# Patient Record
Sex: Female | Born: 1940 | Race: White | Hispanic: No | Marital: Married | Smoking: Former smoker
Health system: Southern US, Community
[De-identification: ages and names within clinical notes are randomized; demographics above are authoritative.]

## PROBLEM LIST (undated history)

## (undated) DIAGNOSIS — I219 Acute myocardial infarction, unspecified: Secondary | ICD-10-CM

## (undated) DIAGNOSIS — R519 Headache, unspecified: Secondary | ICD-10-CM

## (undated) DIAGNOSIS — F419 Anxiety disorder, unspecified: Secondary | ICD-10-CM

## (undated) DIAGNOSIS — T7840XA Allergy, unspecified, initial encounter: Secondary | ICD-10-CM

## (undated) DIAGNOSIS — K219 Gastro-esophageal reflux disease without esophagitis: Secondary | ICD-10-CM

## (undated) DIAGNOSIS — Z87442 Personal history of urinary calculi: Secondary | ICD-10-CM

## (undated) DIAGNOSIS — R51 Headache: Secondary | ICD-10-CM

## (undated) DIAGNOSIS — M810 Age-related osteoporosis without current pathological fracture: Secondary | ICD-10-CM

## (undated) DIAGNOSIS — I10 Essential (primary) hypertension: Secondary | ICD-10-CM

## (undated) DIAGNOSIS — J189 Pneumonia, unspecified organism: Secondary | ICD-10-CM

## (undated) HISTORY — DX: Age-related osteoporosis without current pathological fracture: M81.0

## (undated) HISTORY — PX: TONSILLECTOMY: SUR1361

## (undated) HISTORY — PX: ABDOMINAL HYSTERECTOMY: SHX81

## (undated) HISTORY — PX: APPENDECTOMY: SHX54

## (undated) HISTORY — DX: Allergy, unspecified, initial encounter: T78.40XA

## (undated) HISTORY — PX: BACK SURGERY: SHX140

## (undated) HISTORY — PX: CARDIAC CATHETERIZATION: SHX172

## (undated) HISTORY — PX: SPINE SURGERY: SHX786

---

## 2008-06-09 HISTORY — PX: CATARACT EXTRACTION W/ INTRAOCULAR LENS  IMPLANT, BILATERAL: SHX1307

## 2008-08-08 ENCOUNTER — Ambulatory Visit: Payer: Self-pay | Admitting: Ophthalmology

## 2008-08-21 ENCOUNTER — Ambulatory Visit: Payer: Self-pay | Admitting: Ophthalmology

## 2008-12-06 ENCOUNTER — Ambulatory Visit: Payer: Self-pay | Admitting: Ophthalmology

## 2008-12-18 ENCOUNTER — Ambulatory Visit: Payer: Self-pay | Admitting: Ophthalmology

## 2010-04-23 DIAGNOSIS — F32A Depression, unspecified: Secondary | ICD-10-CM | POA: Insufficient documentation

## 2010-04-23 DIAGNOSIS — E7800 Pure hypercholesterolemia, unspecified: Secondary | ICD-10-CM | POA: Insufficient documentation

## 2010-04-23 DIAGNOSIS — M1990 Unspecified osteoarthritis, unspecified site: Secondary | ICD-10-CM | POA: Insufficient documentation

## 2010-04-23 DIAGNOSIS — E039 Hypothyroidism, unspecified: Secondary | ICD-10-CM | POA: Insufficient documentation

## 2010-04-23 DIAGNOSIS — F329 Major depressive disorder, single episode, unspecified: Secondary | ICD-10-CM

## 2010-04-23 DIAGNOSIS — M797 Fibromyalgia: Secondary | ICD-10-CM | POA: Insufficient documentation

## 2010-04-23 DIAGNOSIS — I10 Essential (primary) hypertension: Secondary | ICD-10-CM | POA: Insufficient documentation

## 2010-04-23 DIAGNOSIS — K589 Irritable bowel syndrome without diarrhea: Secondary | ICD-10-CM | POA: Insufficient documentation

## 2010-07-31 ENCOUNTER — Ambulatory Visit: Payer: Self-pay | Admitting: Unknown Physician Specialty

## 2010-08-02 ENCOUNTER — Ambulatory Visit: Payer: Self-pay | Admitting: Unknown Physician Specialty

## 2011-08-06 ENCOUNTER — Ambulatory Visit: Payer: Self-pay | Admitting: Pain Medicine

## 2011-08-12 ENCOUNTER — Other Ambulatory Visit: Payer: Self-pay | Admitting: Pain Medicine

## 2011-08-12 LAB — SEDIMENTATION RATE: Erythrocyte Sed Rate: 8 mm/hr (ref 0–30)

## 2011-08-19 ENCOUNTER — Ambulatory Visit: Payer: Self-pay | Admitting: Pain Medicine

## 2011-08-25 ENCOUNTER — Ambulatory Visit: Payer: Self-pay | Admitting: Pain Medicine

## 2011-09-04 ENCOUNTER — Ambulatory Visit: Payer: Self-pay | Admitting: Pain Medicine

## 2011-09-18 ENCOUNTER — Ambulatory Visit: Payer: Self-pay | Admitting: Pain Medicine

## 2011-09-29 ENCOUNTER — Ambulatory Visit: Payer: Self-pay | Admitting: Pain Medicine

## 2011-10-02 ENCOUNTER — Ambulatory Visit: Payer: Self-pay | Admitting: Pain Medicine

## 2011-10-20 ENCOUNTER — Ambulatory Visit: Payer: Self-pay | Admitting: Pain Medicine

## 2011-12-10 ENCOUNTER — Ambulatory Visit: Payer: Self-pay | Admitting: Unknown Physician Specialty

## 2011-12-18 ENCOUNTER — Ambulatory Visit: Payer: Self-pay | Admitting: Pain Medicine

## 2012-01-12 ENCOUNTER — Ambulatory Visit: Payer: Self-pay | Admitting: Pain Medicine

## 2012-02-26 ENCOUNTER — Ambulatory Visit: Payer: Self-pay | Admitting: Pain Medicine

## 2012-03-22 ENCOUNTER — Ambulatory Visit: Payer: Self-pay | Admitting: Pain Medicine

## 2012-03-23 ENCOUNTER — Ambulatory Visit: Payer: Self-pay | Admitting: Pain Medicine

## 2012-04-07 ENCOUNTER — Ambulatory Visit: Payer: Self-pay | Admitting: Pain Medicine

## 2012-04-21 ENCOUNTER — Ambulatory Visit: Payer: Self-pay | Admitting: Pain Medicine

## 2012-05-20 ENCOUNTER — Ambulatory Visit: Payer: Self-pay | Admitting: Pain Medicine

## 2012-06-23 ENCOUNTER — Ambulatory Visit: Payer: Self-pay | Admitting: Pain Medicine

## 2012-06-23 ENCOUNTER — Other Ambulatory Visit: Payer: Self-pay | Admitting: Pain Medicine

## 2012-06-23 LAB — BASIC METABOLIC PANEL
Chloride: 93 mmol/L — ABNORMAL LOW (ref 98–107)
Co2: 31 mmol/L (ref 21–32)
Creatinine: 0.68 mg/dL (ref 0.60–1.30)
EGFR (African American): >60
EGFR (Non-African Amer.): >60
Glucose: 103 mg/dL — ABNORMAL HIGH (ref 65–99)
Potassium: 3.7 mmol/L (ref 3.5–5.1)
Sodium: 130 mmol/L — ABNORMAL LOW (ref 136–145)

## 2012-06-29 ENCOUNTER — Ambulatory Visit: Payer: Self-pay | Admitting: Pain Medicine

## 2012-07-19 ENCOUNTER — Ambulatory Visit: Payer: Self-pay | Admitting: Pain Medicine

## 2012-07-27 ENCOUNTER — Ambulatory Visit: Payer: Self-pay | Admitting: Pain Medicine

## 2012-08-11 ENCOUNTER — Ambulatory Visit: Payer: Self-pay | Admitting: Pain Medicine

## 2012-09-07 ENCOUNTER — Ambulatory Visit: Payer: Self-pay | Admitting: Pain Medicine

## 2012-09-27 ENCOUNTER — Ambulatory Visit: Payer: Self-pay | Admitting: Pain Medicine

## 2012-09-28 ENCOUNTER — Ambulatory Visit: Payer: Self-pay | Admitting: Pain Medicine

## 2012-10-20 ENCOUNTER — Ambulatory Visit: Payer: Self-pay | Admitting: Pain Medicine

## 2012-11-02 ENCOUNTER — Ambulatory Visit: Payer: Self-pay | Admitting: Pain Medicine

## 2012-12-03 ENCOUNTER — Ambulatory Visit: Payer: Self-pay | Admitting: Pain Medicine

## 2012-12-27 ENCOUNTER — Ambulatory Visit: Payer: Self-pay | Admitting: Unknown Physician Specialty

## 2012-12-27 LAB — CREATININE, SERUM
Creatinine: 0.71 mg/dL (ref 0.60–1.30)
EGFR (African American): >60

## 2013-04-18 DIAGNOSIS — N6459 Other signs and symptoms in breast: Secondary | ICD-10-CM | POA: Insufficient documentation

## 2013-06-27 ENCOUNTER — Ambulatory Visit: Payer: Self-pay | Admitting: Pain Medicine

## 2013-06-30 ENCOUNTER — Ambulatory Visit: Payer: Self-pay | Admitting: Pain Medicine

## 2013-06-30 ENCOUNTER — Other Ambulatory Visit: Payer: Self-pay | Admitting: Pain Medicine

## 2013-06-30 LAB — MAGNESIUM: Magnesium: 1.8 mg/dL

## 2013-07-13 ENCOUNTER — Ambulatory Visit: Payer: Self-pay | Admitting: Pain Medicine

## 2013-07-18 DIAGNOSIS — M720 Palmar fascial fibromatosis [Dupuytren]: Secondary | ICD-10-CM | POA: Insufficient documentation

## 2013-09-21 ENCOUNTER — Ambulatory Visit: Payer: Self-pay | Admitting: Pain Medicine

## 2013-11-08 ENCOUNTER — Ambulatory Visit: Payer: Self-pay | Admitting: Pain Medicine

## 2013-11-15 ENCOUNTER — Ambulatory Visit: Payer: Self-pay | Admitting: Pain Medicine

## 2013-12-12 ENCOUNTER — Ambulatory Visit: Payer: Self-pay | Admitting: Pain Medicine

## 2013-12-20 ENCOUNTER — Ambulatory Visit: Payer: Self-pay | Admitting: Pain Medicine

## 2013-12-29 ENCOUNTER — Ambulatory Visit: Payer: Self-pay | Admitting: Pain Medicine

## 2014-04-18 ENCOUNTER — Ambulatory Visit: Payer: Self-pay | Admitting: Unknown Physician Specialty

## 2014-05-01 DIAGNOSIS — Z87898 Personal history of other specified conditions: Secondary | ICD-10-CM | POA: Insufficient documentation

## 2014-05-08 ENCOUNTER — Institutional Professional Consult (permissible substitution): Payer: Self-pay | Admitting: Internal Medicine

## 2014-05-12 ENCOUNTER — Ambulatory Visit: Payer: Self-pay | Admitting: Pain Medicine

## 2014-05-19 ENCOUNTER — Ambulatory Visit: Payer: Self-pay | Admitting: Pain Medicine

## 2014-08-09 ENCOUNTER — Encounter: Admit: 2014-08-09 | Disposition: A | Discharge status: Home / Self Care | Payer: Self-pay

## 2014-09-08 ENCOUNTER — Encounter: Admit: 2014-09-08 | Disposition: A | Discharge status: Home / Self Care | Payer: Self-pay

## 2014-09-30 NOTE — Op Note (Signed)
PATIENT NAME:  Kendra Benton, Kendra Benton MR#:  [id number] DATE OF BIRTH:  1940-10-22  DATE OF PROCEDURE:  12/20/2013  Location: Operating Room Referring Physician: Micheal Likens, MD Procedure by: Kathlen Brunswick. Dossie Arbour, M.D.  Note: This is the case of a 74 year old white female patient with a history significant for failed back surgery syndrome and chronic bilateral lumbosacral radiculopathy/radiculitis who comes into the hospital today for permanent placement of a bilateral lumbar spinal cord stimulator under fluoroscopic guidance and IV sedation. On 11/08/2013, the patient had undergone a bilateral lumbar spinal cord stimulator trial with excellent results.   Procedure(s):  1. Permanent implantation of Lumbar Epidural, Double Percutaneous Neurostimulator Leads (16 electrode array). 2. Implantation of Epidural Neurostimulator Generator. 3. Fluoroscopic Needle Guidance 4. Intraoperative Analysis and Programming. 5. Postoperative Analysis and Programming. 6. Moderate Conscious Sedation by the [facility] Anesthesia Team.  Surgeon: Kathlen Brunswick. Dossie Arbour, M.D. Side of implant: Bilateral Top electrode tip level: Lower endplate of T7, bilaterally. Diagnostic Indications: Chronic Lumbosacral Radiculopathy/Radiculitis; failed back surgery syndrome; chronic pain syndrome; chronic postoperative pain; lumbago with sciatica, bilaterally; bilateral sciatica. Position: Prone.  Prepping solution: DuraPrep Area prepped: The thoracolumbar and sacral areas, were prepped with a broad-spectrum topical antiseptic microbicide. Target area: Lumbar Epidural Space, around the T8-9 vertebral body, for the electrode tip. Insertion site is the L2-3 intervertebral space. Level entered: L2-3. Number of attempts: One.  Infection Control: Standard Universal Precautions taken (Respiratory Hygiene/Cough Etiquette; Mouth, nose, eye protection; Hand Hygiene; Personal protective equipment (PPE); safe injection practices; and use of masks  and disposable sterile surgical gloves) as recommended by the Department of [place] for Disease Control and Prevention (CDC).  Safety Measures: Allergies were reviewed. Appropriate site, procedure, and patient were confirmed by following the Joint Commission's Universal Protocol (UP.01.01.01). The patient was asked to confirm marked site and procedure, before commencing. The patient was asked about blood thinners, or active infections, both of which were denied. No attempt was made at seeking any paresthesias. Aspiration looking for blood return was conducted prior to injecting. At no point did we inject any substances, as a needle was being advanced.  Pre-procedure Assessment:  A medical history and physical exam were obtained. Relevant documentation was reviewed and verified. Prior to the procedure, the patient was provided with an Audio CD, as well as written information on the procedure, including side-effects, and possible complications. Under the influence of no sedatives, a verbal, as well as a written informed consent were obtained, after having provided information on the risks and possible complications. To fulfill our ethical and legal obligations, as recommended by the American Medical Association's Code of Ethics, we have provided information to the patient about our clinical impression; the nature and purpose of an available treatment or procedure; the risks and benefits of an available treatment or procedure; alternatives; the risk and benefits of the alternative treatment or procedure; and the risks and benefits of not receiving or undergoing a treatment or procedure. The patient was provided information about the risks and possible complications associated with the procedure. These include, but not limited to, failure to achieve desired goals, infection, bleeding, organ or nerve damage, allergic reactions, paralysis, and death. In addition, the patient was informed that Medicine is  not an exact science; therefore, there is also the possibility of unforeseen risks and possible complications that may result in a catastrophic outcome. The patient indicated having understood very clearly.  We have given the patient no guarantees and we have made no promises. Ample time was given  to the patient to ask questions, all of which were answered, to the patient's satisfaction, before proceeding. The patient understands that by signing our informed consent form, they understand and accept the risks and the fact that it is impossible to predict all possible complications. Baseline vital signs were taken and the medical assessment was completed. Verification of the correct person, correct site (including marking of site), and correct procedure were performed and confirmed by the patient. Baseline vital signs were taken and the initial assessment was completed. Verification of the correct person, correct site (including marking of site), and correct procedure were performed and confirmed by the patient, in the form of a "Time Out".  Monitoring: The patient was monitored in the usual manner, using NIBPM, ECG, and pulse oximetry.  IV Access:  An IV access was obtained and secured.  Analgesia:  Moderate (Conscious) Intravenous sedation: Consent was obtained before administering any sedation. Availability of a responsible, adult driver, and NPO status confirmed. Meaningful verbal contact was maintained, with the patient at all times during the procedure. ASA Sedation Guidelines followed. For specifics on pharmacological type and quantity of sedation, please see nursing chart.  Prophylactic Antibiotics: Cefazolin (1st generation cephalosporin) 1 gm IVPB.  Local Anesthesia: Lidocaine 1%. The skin over the procedure site were infiltrated using a 3 ml Luer-Lok syringe with a 0.5 inch, 25-G needle. Deeper tissues were infiltrated using a 3.0 inch, 22-G spinal needle, under fluoroscopic  guidance.  Fluoroscopy: The patient was taken to the operative suite, where the patient was placed in position for the procedure, over the fluoroscopy compatible table. Fluoroscopy was manipulated, using "Tunnel Vision Technique", to obtain the best possible view of the target area, on the affected side. Parallax error was corrected before commencing the procedure. Gabor Racz's "Direction-Depth-Direction" technique was used to introduce the procedural needle under continuous pulsed fluoroscopic guidance. Once the target was reached, antero-posterior and lateral fluoroscopic views were taken to confirm needle placement in two planes. Fluoroscopy time: Please see the patient's chart for details.  Description of the procedure: The procedure site was prepped using a broad-spectrum topical antiseptic. The area was then draped in the usual and standard manner. "Time-out" was performed as per JC Universal Protocol (UP.01.01.01).   A midline incision was made over the spinous processes, and hemostasis attained. The procedural needle was then introduced through the incision using Gabor Racz's "Direction-Depth-Direction" technique, under pulsed fluoroscopic guidance. No attempt was made at seeking a paresthesia. The paramidline approach was used to enter the posterior lumbar epidural space at a 30 degree angle, using "Loss-of-resistance Technique" with 3 ml of PF-NaCl (0.9% NSS) + 0.5 ml of air, in a 5 ml glass syringe, using a "loss-of-bounce technique", at the desired level. Correct needle placement was confirmed in the  antero-posterior and lateral fluoroscopic views. The epidural lead was gently introduced under real-time fluoroscopy, constantly assessing for pain or paresthesias, until the tip was observed to be at the target level, on the side ipsilateral to the pain. The placement of the second lead was accomplished in a similar fashion. Once the target was thought to have been reached, antero-posterior and  lateral fluoroscopic views were taken to confirm electrode placement in two planes. Placement was tested until a comfortable stimulation pattern was observed over the usual painful area. Once the patient had assured [place] that the stimulation was in the correct pattern, and distribution, we proceeded to remove the 15-G "Tuohy" epidural needle(s). This was done while observing the electrode tip(s) under real-time fluoroscopy  to prevent movement. The patient was sedated again, and 1% lidocaine was infiltrated into the buttocks area, in order to create the generator pocket. The extension was tunneled and connected to the generator. An impedance check was conducted after connecting all sections of the system. Once hemostasis was confirmed, both wounds were closed with Vicryl 2-0 after cleaning them with a solution containing 50:50 hydrogen peroxide and Betadine. Surgical staples were used to close the skin. The wounds were covered with sterile transparent bio-occlusive dressings, to easily assess any evidence of infection in the future.  The patient tolerated the entire procedure well. A repeat set of vitals were taken after the procedure and the patient was kept under observation until discharge criteria was met. The patient was provided with discharge instructions, including a section on how to identify potential problems. Should any problems arise concerning this procedure, the patient was given instructions to immediately contact us, without hesitation. The neurostimulator representative and I, both provided the patient with our Business cards containing our contact telephone numbers, and instructed the patient to contact either one of [place], at any time, should there be any problems or questions. In any case, we plan to contact the patient by telephone for a follow-up status report regarding this interventional procedure.  EBL: 10 mL.  Complications: No heme; no paresthesias.  Disposition: Return to clinics in  10-11 days for removal of staples and postoperative evaluation.  Additional Comments/Plan: With the above-mentioned set up, the patient was able to obtain 100% coverage of her low back and bilateral lower extremity pain, especially on the left side that tends to be her worst.  Equipment used:   1.  Medtronic Sensor SureScan system model [id number], serial [id number]. 2.  Medtronic lead kit model C6295528, lot [id number]. 3.  Medtronic lead kit model [id number], lot [id number].  Disclaimer: Medicine is not an Chief Strategy Officer. The only guarantee in medicine is that nothing is guaranteed. It is important to note that the decision to proceed with this intervention was based on the information collected from the patient. The Data and conclusions were drawn from the patient's questionnaire, the interview, and the physical examination. Because the information was provided in large part by the patient, it cannot be guaranteed that it has not been purposely or unconsciously manipulated. Every effort has been made to obtain as much relevant data as possible for this evaluation. It is important to note that the conclusions that lead to this procedure are derived in large part from the available data. Always take into account that the treatment will also be dependent on availability of resources and existing treatment guidelines, considered by other Pain Management Practitioners as being common knowledge and practice, at this time. For Medico-Legal purposes, it is also important to point out that variations in procedural techniques and pharmacological choices are the acceptable norm. The indications, contraindications, technique, and results of the above procedure should only be interpreted and judged by a Board-Certified Interventional Pain Specialist with extensive familiarity and expertise in the same exact procedure and technique, doing otherwise would be inappropriate and  unethical.  ____________________________ Kathlen Brunswick. Dossie Arbour, MD fan:sb D: 12/20/2013 16:40:42 ET T: 12/20/2013 17:16:55 ET JOB#: [id number]  cc: Buelah Rennie A. Dossie Arbour, MD, <Dictator> Gaspar Cola MD ELECTRONICALLY SIGNED 12/22/2013 9:56

## 2014-10-03 ENCOUNTER — Other Ambulatory Visit: Payer: Self-pay | Admitting: Neurosurgery

## 2014-10-03 DIAGNOSIS — M810 Age-related osteoporosis without current pathological fracture: Secondary | ICD-10-CM

## 2014-10-09 ENCOUNTER — Ambulatory Visit
Admission: RE | Admit: 2014-10-09 | Discharge: 2014-10-09 | Disposition: A | Discharge status: Home / Self Care | Payer: Medicare Other | Source: Ambulatory Visit | Attending: Neurosurgery | Admitting: Neurosurgery

## 2014-10-09 DIAGNOSIS — M8580 Other specified disorders of bone density and structure, unspecified site: Secondary | ICD-10-CM | POA: Insufficient documentation

## 2014-10-09 DIAGNOSIS — M545 Low back pain: Secondary | ICD-10-CM | POA: Insufficient documentation

## 2014-10-09 DIAGNOSIS — M810 Age-related osteoporosis without current pathological fracture: Secondary | ICD-10-CM

## 2014-10-10 ENCOUNTER — Encounter: Payer: Self-pay | Admitting: Physical Therapy

## 2014-10-10 ENCOUNTER — Ambulatory Visit: Payer: Medicare Other | Attending: Neurosurgery | Admitting: Physical Therapy

## 2014-10-10 DIAGNOSIS — M545 Low back pain: Secondary | ICD-10-CM | POA: Insufficient documentation

## 2014-10-10 DIAGNOSIS — M6281 Muscle weakness (generalized): Secondary | ICD-10-CM | POA: Diagnosis not present

## 2014-10-10 DIAGNOSIS — IMO0001 Reserved for inherently not codable concepts without codable children: Secondary | ICD-10-CM

## 2014-10-10 DIAGNOSIS — M4806 Spinal stenosis, lumbar region: Secondary | ICD-10-CM | POA: Insufficient documentation

## 2014-10-10 DIAGNOSIS — M48061 Spinal stenosis, lumbar region without neurogenic claudication: Secondary | ICD-10-CM

## 2014-10-10 NOTE — Therapy (Signed)
[facility] [address], [place], [address] Phone: [phone]   Fax:  [phone]  Physical Therapy Treatment  Patient Details  Name: Kendra Benton MRN: [id number] Date of Birth: October 02, 1940 Referring Provider:  No ref. provider found  Encounter Date: 10/10/2014      PT End of Session - 10/10/14 1257    Visit Number 18   Number of Visits 19   Date for PT Re-Evaluation 10/17/14   Authorization Type 6   Authorization Time Period 10   Authorization - Visit Number 6   Authorization - Number of Visits 10   PT Start Time 0905   PT Stop Time 0955   PT Time Calculation (min) 50 min   Activity Tolerance Patient tolerated treatment well;No increased pain   Behavior During Therapy [facility] for tasks assessed/performed      Past Medical History  Diagnosis Date  . Osteoporosis   . Allergy     No past surgical history on file.  There were no vitals filed for this visit.  Visit Diagnosis:  Spinal stenosis of lumbar region  Muscle weakness (generalized)  Low back syndrome      Subjective Assessment - 10/10/14 1247    Subjective Patient reports she is doing better than when she began therapy.   Patient Stated Goals Patient would like to have decreased pain and able to do former activities.   Currently in Pain? Yes   Pain Location Back   Pain Type Chronic pain   Pain Onset More than a month ago   Pain Frequency Constant   Multiple Pain Sites No            [facility] PT Assessment - 10/10/14 0001    Assessment   Medical Diagnosis spinal stenosis   Onset Date 07/25/14   Balance Screen   Has the patient fallen in the past 6 months No   [place] Private residence   Living Arrangements Spouse/significant other   Prior Function   Level of [place] with basic ADLs   Cognition   Overall Cognitive Status Within Functional Limits for tasks assessed   Other:   Other/ Comments Modified Oswestry 50%- severe disability                 Adult Aquatic Therapy - 10/10/14 1308    Aquatic Therapy Subjective   Subjective Patient reports she feels better than when she started therapy. Had bone scan done yesterday.   Treatment   Gait Foward, sidestepping x 4 laps each   Exercises Seated exercises to include: bicycling, mraching, hip abd/add x 3 min each. Standing exercises to include : push pull with kickboard, lumbar rotation with kickboard 2x 2 min each.                          PT Long Term Goals - 10/10/14 1300    PT LONG TERM GOAL #1   Title Patient will be independent with her home exercise program to improve ability to stand and walk.   Time 6   Status On-going   PT LONG TERM GOAL #2   Title Patient will report 5/10 or less back and LE pain at worst to promote ability to stand and walk   Time 6   Period Weeks   Status On-going   PT LONG TERM GOAL #3   Title Patient will improve at least 6  points on Modified Oswestry Low back pain disability questionnaire.   Time 6   Period Weeks   Status On-going   PT LONG TERM GOAL #4   Title Patient will improve bilateral hip strength by 1/2 MMT grade to improve ability to ambulate   Time 6   Period Weeks   Status Partially Met                 G-Codes - Oct 27, 2014 1259    Functional Limitation Mobility: Walking and moving around   Mobility: Walking and Moving Around Current Status [id number]) At least 40 percent but less than 60 percent impaired, limited or restricted   Mobility: Walking and Moving Around Goal Status [phone]) At least 20 percent but less than 40 percent impaired, limited or restricted      Problem List There are no active problems to display for this patient.   Challen Spainhour, MPT, GCS 2014/10/27, 1:11 PM  [facility] [address], [place], [address] Phone: [phone]   Fax:   [phone]

## 2014-10-12 ENCOUNTER — Ambulatory Visit: Payer: Medicare Other

## 2014-10-12 DIAGNOSIS — IMO0001 Reserved for inherently not codable concepts without codable children: Secondary | ICD-10-CM

## 2014-10-12 DIAGNOSIS — M4806 Spinal stenosis, lumbar region: Secondary | ICD-10-CM | POA: Diagnosis not present

## 2014-10-12 DIAGNOSIS — M6281 Muscle weakness (generalized): Secondary | ICD-10-CM

## 2014-10-12 DIAGNOSIS — M48061 Spinal stenosis, lumbar region without neurogenic claudication: Secondary | ICD-10-CM

## 2014-10-12 NOTE — Therapy (Signed)
[facility] [address] [place], [place], [address] Phone: [phone]   Fax:  [phone]  Oct 13, 2014   [email or web address]  Physical Therapy Discharge Summary  Patient: Kendra Benton  MRN: [id number]  Date of Birth: 03/22/1941   Diagnosis: Spinal stenosis of lumbar region  Muscle weakness (generalized)  Low back syndrome Referring Provider:  Bhowmick, Deb A, MD  The above patient had been seen in Physical Therapy 19 times of 19 treatments scheduled with 0 no shows and 0 cancellations.  The treatment consisted of THERAP EXER-ROM EA 15M, MANUAL THERAPY; EA 15 MIN, NEUROMUSCLE RE-EDUC, EA 15 MIN, GAIT TRAINING THERAPY, EA, THERAPEUTIC ACTIVITY, DIR, CRYOTHERAPY(COLD) OR THERMOTHERAPY(HEAT), AQUATIC THERAPY W EX, EA   The patient is Improved: improved strength, ability to perform chores (per patient).         Subjective Assessment - 10/12/14 1324    Subjective 3/10 low back pain currently. Patient states she thinks that the therapy helped her. Back pain is usually worst in the morning 8/10 (7/10 average) when getting out of bed. Takes an hour doing her routine to decrease back pain. Got a bone scan Monday. If her bones are good, the surgeon might do surgery. Patient states that she feels as if she can perform her house hold cores for longer periords. Better able to load and unload the dishwasher, prepare meals.   Patient Stated Goals Patient would like to have decreased pain and able to do former activities.   Currently in Pain? Yes   Pain Score 3    Pain Location Back   Pain Type Chronic pain   Pain Onset More than a month ago   Pain Frequency Constant   Multiple Pain Sites No      Objectives:   Directed patient with prone glute max extension 2x each LE,  Prone glute/quad sets with 2lb resistance 10x10 seconds for 2 sets each LE,  Standing mini squats 10x2,  Static mini lateral lunge 10x2 each  side,  Standing heel toe raises 10x2 each way  Ergonomic sweeping.  Reviewed ergonomic vacuuming with patient.   Reviewed progress with LE strength with patient.   MMT glute max extension R 4+/5, L 4/5 Hip abduction R 4+/5, L 4/5  Tolerated session without aggravation of symptoms.       PT Long Term Goals - 10/12/14 1400    PT LONG TERM GOAL #1   Title Patient will be independent with her home exercise program to improve ability to stand and walk.   Patient planning on joining a Gym with a pool through [employer] program   Time 6   Status Achieved   PT LONG TERM GOAL #2   Title Patient will report 5/10 or less back and LE pain at worst to promote ability to stand and walk   Time 6   Period Weeks   Status Not Met   PT LONG TERM GOAL #3   Title Patient will improve at least 6 points on Modified Oswestry Low back pain disability questionnaire.   Time 6   Period Weeks   Status Not Met   PT LONG TERM GOAL #4   Title Patient will improve bilateral hip strength by 1/2 MMT grade to improve ability to ambulate   Time 6   Period Weeks   Status Achieved             Plan - 10/13/14 1103    Clinical Impression Statement   Patient has demonstrated improved hip strength and ability to perform chores (per patient) since her initial evaluation. Patient has tolerated aquatic therapy very well with patient stating that she is better able to perform her daily tasks after her aquatic physical therapy sessions. She still states having a lot of back pain during the morings and states possiibility of back surgery. Patient demonstrates independence with her home exercise program and she plans to join a gym with a pool to continue her progress.    Rehab Potential Good   Clinical Impairments Affecting Rehab Potential pain   PT Treatment/Interventions ADLs/Self Care Home Management;Gait training;Neuromuscular re-education;Aquatic Therapy;Patient/family education;Therapeutic  activities;Therapeutic exercise;Manual techniques;Cryotherapy;Moist Heat   PT Next Visit Plan Discharge skilled physical therapy services today.    PT Home Exercise Plan Continue progress with her home exercise program.   Consulted and Agree with Plan of Care Patient       Thank you for your referral.   Sincerely,  Joneen Boers PT, DPT   CC _0 @  [facility] [address] [place], [address] Phone: [phone]   Fax:  [phone]

## 2014-10-13 NOTE — Therapy (Signed)
[facility] [address] [place], [address] Phone: [phone]   Fax:  [phone]  Physical Therapy Treatment  Patient Details  Name: Kendra Benton MRN: [id number] Date of Birth: Oct 30, 1940 Referring Provider:  Bhowmick, Sonda Rumble, MD  Encounter Date: 10/12/2014      PT End of Session - 10/12/14 1428    Visit Number 19   Number of Visits 19   Authorization Type 7   Authorization Time Period 10   PT Start Time 1322   PT Stop Time 1422   PT Time Calculation (min) 60 min   Activity Tolerance Patient tolerated treatment well;No increased pain   Behavior During Therapy [facility] for tasks assessed/performed      Past Medical History  Diagnosis Date  . Osteoporosis   . Allergy     History reviewed. No pertinent past surgical history.  There were no vitals filed for this visit.  Visit Diagnosis:  Spinal stenosis of lumbar region  Muscle weakness (generalized)  Low back syndrome      Subjective Assessment - 10/12/14 1324    Subjective 3/10 low back pain currently. Patient states she thinks that the therapy helped her. Back pain is usually worst in the morning 8/10 (7/10 average) when getting out of bed. Takes an hour doing her routine to decrease back pain. Got a bone scan Monday. If her bones are good, the surgeon might do surgery. Patient states that she feels as if she can perform her house hold cores for longer periords. Better able to load and unload the dishwasher, prepare meals.   Patient Stated Goals Patient would like to have decreased pain and able to do former activities.   Currently in Pain? Yes   Pain Score 3    Pain Location Back   Pain Type Chronic pain   Pain Onset More than a month ago   Pain Frequency Constant   Multiple Pain Sites No         Objectives:   Directed patient with prone glute max extension 2x each LE,  Prone glute/quad sets with 2lb resistance 10x10 seconds for 2  sets each LE,  Standing mini squats 10x2,  Static mini lateral lunge 10x2 each side,  Standing heel toe raises 10x2 each way  Ergonomic sweeping.  Reviewed ergonomic vacuuming with patient.   Reviewed progress with LE strength with patient.   MMT glute max extension R 4+/5, L 4/5 Hip abduction R 4+/5, L 4/5  Tolerated session without aggravation of symptoms.                          PT Education - 10/13/14 1056    Education provided Yes   Education Details Ther-ex, PT plan (join gym with pool), ergonomics, progress with strength   Person(s) Educated Patient   Methods Explanation;Demonstration;Tactile cues;Verbal cues   Comprehension Verbalized understanding;Returned demonstration             PT Long Term Goals - 10/12/14 1400    PT LONG TERM GOAL #1   Title Patient will be independent with her home exercise program to improve ability to stand and walk.   Patient planning on joining a Gym with a pool through [employer] program   Time 6   Status Achieved   PT LONG TERM GOAL #2   Title Patient will report 5/10 or less back and LE pain at worst to promote ability to  stand and walk   Time 6   Period Weeks   Status Not Met   PT LONG TERM GOAL #3   Title Patient will improve at least 6 points on Modified Oswestry Low back pain disability questionnaire.   Time 6   Period Weeks   Status Not Met   PT LONG TERM GOAL #4   Title Patient will improve bilateral hip strength by 1/2 MMT grade to improve ability to ambulate   Time 6   Period Weeks   Status Achieved               Plan - 11-01-14 1103    Clinical Impression Statement Patient has demonstrated improved hip strength and ability to perform chores (per patient) since her initial evaluation. Patient has tolerated aquatic therapy very well with patient stating that she is better able to perform her daily tasks after her aquatic physical therapy sessions. She still states having a lot  of back pain during the morings and states possiibility of back surgery. Patient demonstrates independence with her home exercise program and she plans to join a gym with a pool to continue her progress.    Rehab Potential Good   Clinical Impairments Affecting Rehab Potential pain   PT Treatment/Interventions ADLs/Self Care Home Management;Gait training;Neuromuscular re-education;Aquatic Therapy;Patient/family education;Therapeutic activities;Therapeutic exercise;Manual techniques;Cryotherapy;Moist Heat   PT Next Visit Plan Discharge skilled physical therapy services today.    PT Home Exercise Plan Continue progress with her home exercise program.   Consulted and Agree with Plan of Care Patient          G-Codes - 11/01/2014 1123    Functional Assessment Tool Used Modified Oswestry   Functional Limitation Mobility: Walking and moving around   Mobility: Walking and Moving Around Current Status [phone]) At least 40 percent but less than 60 percent impaired, limited or restricted   Mobility: Walking and Moving Around Goal Status [phone]) At least 20 percent but less than 40 percent impaired, limited or restricted   Mobility: Walking and Moving Around Discharge Status [phone]) At least 40 percent but less than 60 percent impaired, limited or restricted      Problem List There are no active problems to display for this patient.   Redding Cloe 2014-11-01, 11:30 AM  [facility] [address] [place], [address] Phone: [phone]   Fax:  [phone]

## 2014-10-13 NOTE — Patient Instructions (Signed)
Improved exercise technique, movement at target joints, use of target muscles after mod verbal, visual, tactile cues.   Recommended for patient to join a gym Paramedic program if possible) with a pool to continue progress with her aquatic therapy.

## 2015-01-01 ENCOUNTER — Ambulatory Visit: Payer: Medicare Other | Attending: Neurosurgery | Admitting: Physical Therapy

## 2015-01-01 ENCOUNTER — Encounter: Payer: Self-pay | Admitting: Physical Therapy

## 2015-01-01 DIAGNOSIS — M545 Low back pain: Secondary | ICD-10-CM | POA: Diagnosis present

## 2015-01-01 DIAGNOSIS — Z981 Arthrodesis status: Secondary | ICD-10-CM | POA: Insufficient documentation

## 2015-01-01 DIAGNOSIS — M6281 Muscle weakness (generalized): Secondary | ICD-10-CM | POA: Diagnosis present

## 2015-01-01 NOTE — Therapy (Signed)
[facility] [address] [place], [address] Phone: [phone]   Fax:  [phone]  Physical Therapy Evaluation  Patient Details  Name: Kendra Benton MRN: [id number] Date of Birth: 25-Sep-1940 Referring Provider:  Zollie Beckers, MD  Encounter Date: 01/01/2015      PT End of Session - 01/01/15 0900    Visit Number 1   Number of Visits 13   Date for PT Re-Evaluation 02/12/15   Authorization Type 1   Authorization Time Period 10   PT Start Time 0800   PT Stop Time 0845   PT Time Calculation (min) 45 min   Equipment Utilized During Treatment Back brace   Activity Tolerance Patient tolerated treatment well   Behavior During Therapy [facility] for tasks assessed/performed      Past Medical History  Diagnosis Date  . Osteoporosis   . Allergy     Past Surgical History  Procedure Laterality Date  . Spine surgery      There were no vitals filed for this visit.  Visit Diagnosis:  S/P lumbar fusion - Plan: PT plan of care cert/re-cert      Subjective Assessment - 01/01/15 0852    Subjective Patient is s/p lumbar fusion 11/10/2014. Patient is wearing back brace that per her report she wears when she is standing or walking. She is allowed to take it off when sitting. Patient had HHPT once she left hospital. She currenly is trying to walk daily and has HEP that she continues to perform daily.    Limitations Standing;Walking;House hold activities   Patient Stated Goals Patient would like to have decreased discomfort and return to prior funcitonal activities.    Currently in Pain? No/denies   Pain Location Back   Pain Type Surgical pain   Pain Onset More than a month ago   Pain Frequency Constant   Multiple Pain Sites No            [facility] PT Assessment - 01/01/15 0001    Assessment   Medical Diagnosis lumbar fusion   Onset Date/Surgical Date 11/10/14   Next MD Visit 01/29/2015   Prior Therapy HHPT    Precautions   Precautions Back   Precaution Comments Back brace   Required Braces or Orthoses Spinal Brace   Spinal Brace Applied in standing position   Balance Screen   Has the patient fallen in the past 6 months No   [place] residence   Living Arrangements Spouse/significant other   Available Help at Discharge Family   Prior Function   Level of Independence Independent   Cognition   Overall Cognitive Status Within Functional Limits for tasks assessed   Observation/Other Assessments   Modified Oswertry 46%   Lower Extremity Functional Scale  32/80   Sensation   Light Touch Appears Intact   ROM / Strength   AROM / PROM / Strength Strength   Strength   Overall Strength Within functional limits for tasks performed   Ambulation/Gait   Ambulation/Gait Yes   Ambulation/Gait Assistance 6: Modified independent (Device/Increase time)   Assistive device Straight cane   Stairs Yes   Stairs Assistance 6: Modified independent (Device/Increase time)   Stair Management Technique Two rails;Alternating pattern   Number of Stairs 4   Gait Comments 6MW 1050'   6 Minute Walk- Baseline   6 Minute Walk- Baseline yes        Treatment to include, gait  on stairs, prolonged walking with spc.           PT Education - 2015-01-30 0900    Education provided Yes   Education Details to continue HEP, plan of care   Person(s) Educated Patient   Methods Explanation   Comprehension Verbalized understanding             PT Long Term Goals - 01-30-2015 0905    PT LONG TERM GOAL #5   Title Patient will demonstrate improved functional mobility with LEFS score of > 50/80   Baseline 32/80   Time 6   Period Weeks   Status New   Additional Long Term Goals   Additional Long Term Goals Yes   PT LONG TERM GOAL #6   Title Patient will demonstrate improved gait speed with 6MW test at > 1500'   Baseline 1050'   Time 6   Period Weeks   Status New   PT  LONG TERM GOAL #7   Title Patient will demonstrate improved functional independence and decreased disability with improved mod. osewstry score of <30%   Baseline 46%   Time 6   Period Weeks   Status New               Plan - 01/30/2015 0902    Clinical Impression Statement Patient is s/p lumbar fusion, reports discomfort, not really pain in low back. Pt has back brace donned. Pt demonstrates good strength in LEs, but fatigues with extended period of activity. 6MW was 1050'. Slight increased discomfort with prolonged gait.    Pt will benefit from skilled therapeutic intervention in order to improve on the following deficits Decreased activity tolerance;Pain;Difficulty walking;Impaired flexibility   Rehab Potential Good   PT Frequency 2x / week   PT Duration 6 weeks   PT Treatment/Interventions Aquatic Therapy;Therapeutic exercise;Therapeutic activities;Manual techniques;Passive range of motion   PT Next Visit Plan aquatic therapy for 5 visits   PT Home Exercise Plan continue with current HEP   Consulted and Agree with Plan of Care Patient          G-Codes - January 30, 2015 0908    Functional Assessment Tool Used 6MW, Oswestry, LEFS   Functional Limitation Mobility: Walking and moving around   Mobility: Walking and Moving Around Current Status (S5681) At least 20 percent but less than 40 percent impaired, limited or restricted   Mobility: Walking and Moving Around Goal Status [phone]) At least 1 percent but less than 20 percent impaired, limited or restricted       Problem List There are no active problems to display for this patient.   Journei Thomassen, PT, MPT, GCS 2015-01-30, 9:11 AM  [facility] [address], [place], [address] Phone: [phone]   Fax:  [phone]

## 2015-01-02 ENCOUNTER — Ambulatory Visit: Payer: Medicare Other | Admitting: Physical Therapy

## 2015-01-02 DIAGNOSIS — Z981 Arthrodesis status: Secondary | ICD-10-CM | POA: Diagnosis not present

## 2015-01-02 DIAGNOSIS — IMO0001 Reserved for inherently not codable concepts without codable children: Secondary | ICD-10-CM

## 2015-01-02 DIAGNOSIS — M6281 Muscle weakness (generalized): Secondary | ICD-10-CM

## 2015-01-02 NOTE — Therapy (Signed)
[facility] [address] [place], [address] Phone: [phone]   Fax:  [phone]  Physical Therapy Treatment  Patient Details  Name: Kendra Benton MRN: [id number] Date of Birth: [birth year] Referring Provider:  Zollie Beckers, MD  Encounter Date: 01/02/2015      PT End of Session - 01/02/15 1248    Visit Number 2   Number of Visits 13   Date for PT Re-Evaluation 02/12/15   Authorization Type 2   Authorization Time Period 10   PT Start Time 1100   PT Stop Time 1146   PT Time Calculation (min) 46 min   Activity Tolerance Patient tolerated treatment well;No increased pain   Behavior During Therapy [facility] for tasks assessed/performed      Past Medical History  Diagnosis Date  . Osteoporosis   . Allergy     Past Surgical History  Procedure Laterality Date  . Spine surgery      There were no vitals filed for this visit.  Visit Diagnosis:  Muscle weakness (generalized)  Low back syndrome  S/P lumbar fusion      Subjective Assessment - 01/02/15 1242    Subjective Patient reports she is feeling good, reports she can "feel" the areas in her backside, but is okay, and has less pain than prior to sx.   Limitations Standing;Walking;House hold activities   Patient Stated Goals Patient would like to have decreased discomfort and return to prior funcitonal activities.    Currently in Pain? No/denies   Multiple Pain Sites No                     Adult Aquatic Therapy - 01/02/15 1244    Aquatic Therapy Subjective   Subjective Patient reports feeling good.   Treatment   Gait Forward, backward and sidestepping x 4 laps each   Exercises Seated exercises to include: hamstring, low back stretching and piriformis stretching bilaterally x 1 min each.Standing marching, hip abd/add x 3 min each.                     PT Education - 01/02/15 1248    Education provided Yes    Education Details exercises, stretching   Person(s) Educated Patient   Methods Explanation;Demonstration   Comprehension Verbalized understanding;Returned demonstration             PT Long Term Goals - 01/01/15 0905    PT LONG TERM GOAL #5   Title Patient will demonstrate improved functional mobility with LEFS score of > 50/80   Baseline 32/80   Time 6   Period Weeks   Status New   Additional Long Term Goals   Additional Long Term Goals Yes   PT LONG TERM GOAL #6   Title Patient will demonstrate improved gait speed with 6MW test at > 1500'   Baseline 1050'   Time 6   Period Weeks   Status New   PT LONG TERM GOAL #7   Title Patient will demonstrate improved functional independence and decreased disability with improved mod. osewstry score of <30%   Baseline 46%   Time 6   Period Weeks   Status New               Plan - 01/02/15 1249    Clinical Impression Statement Patient doing well and enjoys aquatic environment.   Pt will benefit from skilled therapeutic intervention in order to improve on  the following deficits Decreased activity tolerance;Pain;Difficulty walking;Impaired flexibility   Rehab Potential Good   PT Frequency 2x / week   PT Duration 6 weeks   PT Treatment/Interventions Aquatic Therapy;Therapeutic exercise;Therapeutic activities;Manual techniques;Passive range of motion   PT Next Visit Plan aquatic therapy for 5 visits   PT Home Exercise Plan continue with current HEP   Consulted and Agree with Plan of Care Patient          G-Codes - 01-22-15 0908    Functional Assessment Tool Used 6MW, Oswestry, LEFS   Functional Limitation Mobility: Walking and moving around   Mobility: Walking and Moving Around Current Status (Y5110) At least 20 percent but less than 40 percent impaired, limited or restricted   Mobility: Walking and Moving Around Goal Status [phone]) At least 1 percent but less than 20 percent impaired, limited or restricted       Problem List There are no active problems to display for this patient.   Chanika Byland, PT, MPT, GCS 01/02/2015, 12:50 PM  [facility] [address] [place], [address] Phone: [phone]   Fax:  [phone]

## 2015-01-04 ENCOUNTER — Ambulatory Visit: Payer: Medicare Other | Admitting: Physical Therapy

## 2015-01-04 DIAGNOSIS — M6281 Muscle weakness (generalized): Secondary | ICD-10-CM

## 2015-01-04 DIAGNOSIS — Z981 Arthrodesis status: Secondary | ICD-10-CM

## 2015-01-04 DIAGNOSIS — IMO0001 Reserved for inherently not codable concepts without codable children: Secondary | ICD-10-CM

## 2015-01-04 NOTE — Therapy (Signed)
[facility] [address] [place], [address] Phone: [phone]   Fax:  [phone]  Physical Therapy Treatment  Patient Details  Name: Kendra Benton MRN: [id number] Date of Birth: Oct 16, 1940 Referring Provider:  Zollie Beckers, MD  Encounter Date: 01/04/2015      PT End of Session - 01/04/15 1328    Visit Number 3   Number of Visits 13   Date for PT Re-Evaluation 02/12/15   Authorization Type 3   Authorization Time Period 10   PT Start Time 0845   PT Stop Time 0930   PT Time Calculation (min) 45 min   Activity Tolerance Patient tolerated treatment well;No increased pain      Past Medical History  Diagnosis Date  . Osteoporosis   . Allergy     Past Surgical History  Procedure Laterality Date  . Spine surgery      There were no vitals filed for this visit.  Visit Diagnosis:  Muscle weakness (generalized)  Low back syndrome  S/P lumbar fusion      Subjective Assessment - 01/04/15 1326    Subjective Patient reports she is feeling good. Looks forward to coming to the pool.    Limitations Standing;Walking;House hold activities   Patient Stated Goals Patient would like to have decreased discomfort and return to prior funcitonal activities.    Currently in Pain? No/denies                     Adult Aquatic Therapy - 01/04/15 1327    Aquatic Therapy Subjective   Subjective Patient reports feeling good.   Treatment   Gait Forward, backward and sidestepping x 4 laps each   Exercises Seated exercises to include: hamstring, low back stretching and piriformis stretching bilaterally x 1 min each.Standing marching, hip abd/add, heel raises, squats x 3 min each.                     PT Education - 01/04/15 1328    Education provided Yes   Education Details progression of exercises   Person(s) Educated Patient   Methods Explanation;Demonstration   Comprehension  Verbalized understanding;Returned demonstration             PT Long Term Goals - 01/01/15 0905    PT LONG TERM GOAL #5   Title Patient will demonstrate improved functional mobility with LEFS score of > 50/80   Baseline 32/80   Time 6   Period Weeks   Status New   Additional Long Term Goals   Additional Long Term Goals Yes   PT LONG TERM GOAL #6   Title Patient will demonstrate improved gait speed with 6MW test at > 1500'   Baseline 1050'   Time 6   Period Weeks   Status New   PT LONG TERM GOAL #7   Title Patient will demonstrate improved functional independence and decreased disability with improved mod. osewstry score of <30%   Baseline 46%   Time 6   Period Weeks   Status New               Plan - 01/04/15 1329    Clinical Impression Statement Patient is progressing well with additional exercises this session. No increased pain reported.    Pt will benefit from skilled therapeutic intervention in order to improve on the following deficits Decreased activity tolerance;Pain;Difficulty walking;Impaired flexibility   Rehab Potential Good   PT  Frequency 2x / week   PT Duration 6 weeks   PT Treatment/Interventions Aquatic Therapy;Therapeutic exercise;Therapeutic activities;Manual techniques;Passive range of motion   PT Next Visit Plan aquatic therapy for 5 visits   PT Home Exercise Plan continue with current HEP   Consulted and Agree with Plan of Care Patient        Problem List There are no active problems to display for this patient.   Naz Denunzio, PT, MPT, GCS 01/04/2015, 1:30 PM  [facility] [address], [place], [address] Phone: [phone]   Fax:  [phone]

## 2015-01-09 ENCOUNTER — Ambulatory Visit: Payer: Medicare Other | Attending: Neurosurgery | Admitting: Physical Therapy

## 2015-01-09 DIAGNOSIS — Z981 Arthrodesis status: Secondary | ICD-10-CM

## 2015-01-09 DIAGNOSIS — IMO0001 Reserved for inherently not codable concepts without codable children: Secondary | ICD-10-CM

## 2015-01-09 DIAGNOSIS — M545 Low back pain: Secondary | ICD-10-CM | POA: Diagnosis present

## 2015-01-09 DIAGNOSIS — M4806 Spinal stenosis, lumbar region: Secondary | ICD-10-CM | POA: Insufficient documentation

## 2015-01-09 DIAGNOSIS — M6281 Muscle weakness (generalized): Secondary | ICD-10-CM

## 2015-01-09 DIAGNOSIS — M48061 Spinal stenosis, lumbar region without neurogenic claudication: Secondary | ICD-10-CM

## 2015-01-09 NOTE — Therapy (Signed)
[facility] [address] [place], [address] Phone: [phone]   Fax:  [phone]  Physical Therapy Treatment  Patient Details  Name: LADAWN BOULLION MRN: [id number] Date of Birth: December 17, 1940 Referring Provider:  Zollie Beckers, MD  Encounter Date: 01/09/2015      PT End of Session - 01/09/15 1215    Visit Number 4   Number of Visits 13   Date for PT Re-Evaluation 02/12/15   Authorization Type 4   Authorization Time Period 10   PT Start Time 0915   PT Stop Time 1000   PT Time Calculation (min) 45 min   Activity Tolerance No increased pain;Patient tolerated treatment well      Past Medical History  Diagnosis Date  . Osteoporosis   . Allergy     Past Surgical History  Procedure Laterality Date  . Spine surgery      There were no vitals filed for this visit.  Visit Diagnosis:  Muscle weakness (generalized)  Low back syndrome  S/P lumbar fusion  Spinal stenosis of lumbar region      Subjective Assessment - 01/09/15 1003    Subjective Patient reports she has been unable to walk due to the heat. Reports low back pain across waistband area at bottom of incision. Also has continued left hip pain.    Limitations Standing;Walking;House hold activities   Patient Stated Goals Patient would like to have decreased discomfort and return to prior funcitonal activities.    Currently in Pain? Yes   Pain Location Back   Pain Orientation Left   Pain Descriptors / Indicators Aching   Pain Onset More than a month ago   Pain Frequency Constant   Multiple Pain Sites No                     Adult Aquatic Therapy - 01/09/15 1005    Aquatic Therapy Subjective   Subjective Reports pain along waistband area of low back. At bottom of incision.    Treatment   Gait Forward, backward and sidestepping x 4 laps each   Exercises Seated exercises to include: hamstring, low back stretching and  piriformis stretching bilaterally x 1 min each.Standing marching, hip abd/add, heel raises, squats x 3 min each.                     PT Education - 01/09/15 1215    Education provided Yes   Education Details progression of exercise   Person(s) Educated Patient   Methods Explanation;Demonstration   Comprehension Verbalized understanding;Returned demonstration             PT Long Term Goals - 01/01/15 0905    PT LONG TERM GOAL #5   Title Patient will demonstrate improved functional mobility with LEFS score of > 50/80   Baseline 32/80   Time 6   Period Weeks   Status New   Additional Long Term Goals   Additional Long Term Goals Yes   PT LONG TERM GOAL #6   Title Patient will demonstrate improved gait speed with 6MW test at > 1500'   Baseline 1050'   Time 6   Period Weeks   Status New   PT LONG TERM GOAL #7   Title Patient will demonstrate improved functional independence and decreased disability with improved mod. osewstry score of <30%   Baseline 46%   Time 6   Period Weeks   Status New  Plan - 01/09/15 1216    Clinical Impression Statement Patient doing well this session with increased activities and exercises. No increased pain reported   Pt will benefit from skilled therapeutic intervention in order to improve on the following deficits Decreased activity tolerance;Pain;Difficulty walking;Impaired flexibility   Rehab Potential Good   PT Frequency 2x / week   PT Duration 6 weeks   PT Treatment/Interventions Aquatic Therapy;Therapeutic exercise;Therapeutic activities;Manual techniques;Passive range of motion   PT Home Exercise Plan continue with current HEP   Consulted and Agree with Plan of Care Patient        Problem List There are no active problems to display for this patient.   Cassidi Modesitt, PT, MPT, GCS 01/09/2015, 12:18 PM  [facility] [address] [place], [address] Phone: [phone]   Fax:  [phone]

## 2015-01-11 ENCOUNTER — Ambulatory Visit: Payer: Medicare Other | Admitting: Physical Therapy

## 2015-01-11 DIAGNOSIS — IMO0001 Reserved for inherently not codable concepts without codable children: Secondary | ICD-10-CM

## 2015-01-11 DIAGNOSIS — M6281 Muscle weakness (generalized): Secondary | ICD-10-CM | POA: Diagnosis not present

## 2015-01-11 DIAGNOSIS — M48061 Spinal stenosis, lumbar region without neurogenic claudication: Secondary | ICD-10-CM

## 2015-01-11 DIAGNOSIS — Z981 Arthrodesis status: Secondary | ICD-10-CM

## 2015-01-11 NOTE — Therapy (Signed)
[facility] [address], [place], [address] Phone: [phone]   Fax:  [phone]  Physical Therapy Treatment  Patient Details  Name: Kendra Benton MRN: [id number] Date of Birth: July 22, 1940 Referring Provider:  Zollie Beckers, MD  Encounter Date: 01/11/2015      PT End of Session - 01/11/15 1319    Visit Number 5   Number of Visits 13   Date for PT Re-Evaluation 02/12/15   Authorization Type 5   Authorization Time Period 10   PT Start Time 0915   PT Stop Time 1000   PT Time Calculation (min) 45 min   Equipment Utilized During Treatment Back brace   Activity Tolerance Patient tolerated treatment well;No increased pain   Behavior During Therapy [facility] for tasks assessed/performed      Past Medical History  Diagnosis Date  . Osteoporosis   . Allergy     Past Surgical History  Procedure Laterality Date  . Spine surgery      There were no vitals filed for this visit.  Visit Diagnosis:  Muscle weakness (generalized)  S/P lumbar fusion  Spinal stenosis of lumbar region  Low back syndrome      Subjective Assessment - 01/11/15 1315    Subjective Patient reports that she has had her husband massage her scar area and that it is helping. Pt reports she is feeling overall better. States that she went and joined [employer] and plans to walk and use pool there.    Limitations Standing;Walking;House hold activities   Patient Stated Goals Patient would like to have decreased discomfort and return to prior funcitonal activities.    Currently in Pain? No/denies                     Adult Aquatic Therapy - 01/11/15 1316    Aquatic Therapy Subjective   Subjective Reports pain along waistline is improved after massaging. Overall feeling better.   Treatment   Gait Forward, backward and sidestepping x 4 laps each   Exercises Seated exercises to include: hamstring, low back  stretching and piriformis stretching bilaterally x 1 min each.Standing marching, hip abd/add, heel raises, squats x 3 min each.    Specific Exercises Hip/Low Back   Hip/Low Back push pull with kickboard, narrow stance with arm flexion/extension, small gentle rotations. x 2 min                    PT Education - 01/11/15 1318    Education provided Yes   Education Details exercise progression, transitioning to community gym             PT Long Term Goals - 01/01/15 0905    PT LONG TERM GOAL #5   Title Patient will demonstrate improved functional mobility with LEFS score of > 50/80   Baseline 32/80   Time 6   Period Weeks   Status New   Additional Long Term Goals   Additional Long Term Goals Yes   PT LONG TERM GOAL #6   Title Patient will demonstrate improved gait speed with 6MW test at > 1500'   Baseline 1050'   Time 6   Period Weeks   Status New   PT LONG TERM GOAL #7   Title Patient will demonstrate improved functional independence and decreased disability with improved mod. osewstry score of <30%   Baseline 46%   Time 6   Period Weeks  Status New               Plan - 01/11/15 1322    Clinical Impression Statement Patient continues to make good progress with back pain and mobility.    Pt will benefit from skilled therapeutic intervention in order to improve on the following deficits Decreased activity tolerance;Pain;Difficulty walking;Impaired flexibility   Rehab Potential Good   PT Frequency 2x / week   PT Duration 6 weeks   PT Treatment/Interventions Aquatic Therapy;Therapeutic exercise;Therapeutic activities;Manual techniques;Passive range of motion   PT Home Exercise Plan continue with current HEP   Consulted and Agree with Plan of Care Patient        Problem List There are no active problems to display for this patient.   Obediah Welles, PT, MPT, GCS 01/11/2015, 1:29 PM  [place] [facility] [address] [place], [address] Phone: [phone]   Fax:  [phone]

## 2015-01-16 ENCOUNTER — Ambulatory Visit: Payer: Medicare Other | Admitting: Physical Therapy

## 2015-01-16 DIAGNOSIS — IMO0001 Reserved for inherently not codable concepts without codable children: Secondary | ICD-10-CM

## 2015-01-16 DIAGNOSIS — M48061 Spinal stenosis, lumbar region without neurogenic claudication: Secondary | ICD-10-CM

## 2015-01-16 DIAGNOSIS — M6281 Muscle weakness (generalized): Secondary | ICD-10-CM | POA: Diagnosis not present

## 2015-01-16 DIAGNOSIS — Z981 Arthrodesis status: Secondary | ICD-10-CM

## 2015-01-16 NOTE — Therapy (Signed)
[place] [facility] [address] [place], [address] Phone: [phone]   Fax:  [phone]  Physical Therapy Treatment  Patient Details  Name: Kendra Benton MRN: [id number] Date of Birth: 12-27-1940 Referring Provider:  Zollie Beckers, MD  Encounter Date: 01/16/2015      PT End of Session - 01/16/15 1214    Visit Number 6   Number of Visits 14   Date for PT Re-Evaluation 02/12/15   Authorization Type 6   Authorization Time Period 10   PT Start Time 1045   PT Stop Time 1130   PT Time Calculation (min) 45 min   Activity Tolerance Patient tolerated treatment well;No increased pain   Behavior During Therapy [facility] for tasks assessed/performed      Past Medical History  Diagnosis Date  . Osteoporosis   . Allergy     Past Surgical History  Procedure Laterality Date  . Spine surgery      There were no vitals filed for this visit.  Visit Diagnosis:  Muscle weakness (generalized)  S/P lumbar fusion  Spinal stenosis of lumbar region  Low back syndrome      Subjective Assessment - 01/16/15 1213    Subjective Patient reports she went to pool at gym and walked on treadmill without increased pain.    Limitations Standing;Walking;House hold activities   Patient Stated Goals Patient would like to have decreased discomfort and return to prior funcitonal activities.    Currently in Pain? No/denies                     Adult Aquatic Therapy - 01/16/15 1214    Aquatic Therapy Subjective   Subjective Reports improvement in pain and progressing to community gym   Treatment   Gait Forward, backward and sidestepping x 4 laps each   Exercises Seated exercises to include: hamstring, low back stretching and piriformis stretching bilaterally x 1 min each.Standing marching, hip abd/add, heel raises, squats x 3 min each.    Specific Exercises Hip/Low Back   Hip/Low Back push pull with kickboard,  narrow stance with arm flexion/extension, small gentle rotations. x 2 min                         PT Long Term Goals - 01/01/15 0905    PT LONG TERM GOAL #5   Title Patient will demonstrate improved functional mobility with LEFS score of > 50/80   Baseline 32/80   Time 6   Period Weeks   Status New   Additional Long Term Goals   Additional Long Term Goals Yes   PT LONG TERM GOAL #6   Title Patient will demonstrate improved gait speed with 6MW test at > 1500'   Baseline 1050'   Time 6   Period Weeks   Status New   PT LONG TERM GOAL #7   Title Patient will demonstrate improved functional independence and decreased disability with improved mod. osewstry score of <30%   Baseline 46%   Time 6   Period Weeks   Status New               Plan - 01/16/15 1215    Clinical Impression Statement Patient is progressing to community activities. Will re-assess next visit.   Pt will benefit from skilled therapeutic intervention in order to improve on the following deficits Decreased activity tolerance;Pain;Difficulty walking;Impaired flexibility   Rehab Potential Good  PT Frequency 2x / week   PT Duration 6 weeks   PT Treatment/Interventions Aquatic Therapy;Therapeutic exercise;Therapeutic activities;Manual techniques;Passive range of motion   Consulted and Agree with Plan of Care Patient        Problem List There are no active problems to display for this patient.   Vale Mousseau, PT 01/16/2015, 12:16 PM  [place] [facility] [address] [place], [address] Phone: [phone]   Fax:  [phone]

## 2015-01-19 ENCOUNTER — Ambulatory Visit: Payer: Medicare Other | Admitting: Physical Therapy

## 2015-01-19 DIAGNOSIS — IMO0001 Reserved for inherently not codable concepts without codable children: Secondary | ICD-10-CM

## 2015-01-19 DIAGNOSIS — M6281 Muscle weakness (generalized): Secondary | ICD-10-CM | POA: Diagnosis not present

## 2015-01-19 DIAGNOSIS — Z981 Arthrodesis status: Secondary | ICD-10-CM

## 2015-01-19 DIAGNOSIS — M48061 Spinal stenosis, lumbar region without neurogenic claudication: Secondary | ICD-10-CM

## 2015-01-19 NOTE — Therapy (Signed)
[facility] [address], [place], [address] Phone: [phone]   Fax:  [phone]  Physical Therapy Treatment  Patient Details  Name: Kendra Benton MRN: [id number] Date of Birth: 11-25-1940 Referring Provider:  Zollie Beckers, MD  Encounter Date: 01/19/2015      PT End of Session - 01/19/15 1221    Visit Number 7   Number of Visits 14   Date for PT Re-Evaluation 02/12/15   Authorization Type 7   Authorization Time Period 10   PT Start Time 1115   PT Stop Time 1200   PT Time Calculation (min) 45 min   Equipment Utilized During Treatment Back brace   Activity Tolerance Patient tolerated treatment well;No increased pain   Behavior During Therapy [facility] for tasks assessed/performed      Past Medical History  Diagnosis Date  . Osteoporosis   . Allergy     Past Surgical History  Procedure Laterality Date  . Spine surgery      There were no vitals filed for this visit.  Visit Diagnosis:  Muscle weakness (generalized)  S/P lumbar fusion  Spinal stenosis of lumbar region  Low back syndrome      Subjective Assessment - 01/19/15 1219    Subjective Patient reports pain on right side at waist level today. Does not recall doing anything unusual or different.    Limitations Standing;Walking;House hold activities   Patient Stated Goals Patient would like to have decreased discomfort and return to prior funcitonal activities.    Currently in Pain? Yes   Pain Score 3    Pain Location Back   Pain Orientation Right   Pain Descriptors / Indicators Constant;Dull;Aching   Pain Type Chronic pain   Pain Onset More than a month ago   Pain Frequency Constant   Multiple Pain Sites No        Re-assessed patient after pool therapy session. Patient has made progress in all areas Increased to 1200' on 6MW, 43/80 on LEFS, 20% on Mod Oswestry Manual therapy to low back, paraspinals bilaterally, scar  massage  Patient reports improvement in pain after. As well as increased flexibility.         PT Education - 01/19/15 1220    Education provided Yes   Education Details providing STM to low back over scar and to lateral spinal muscles for pain relief and tightness.    Person(s) Educated Patient   Methods Explanation   Comprehension Verbalized understanding             PT Long Term Goals - 01/19/15 1224    PT LONG TERM GOAL #1   Title Patient will be independent with her home exercise program to improve ability to stand and walk.    Time 6   Period Weeks   Status Achieved   PT LONG TERM GOAL #2   Title Patient will report 5/10 or less back and LE pain at worst to promote ability to stand and walk   Time 6   Period Weeks   Status Achieved   PT LONG TERM GOAL #3   Title Patient will improve at least 6 points on Modified Oswestry Low back pain disability questionnaire.   Baseline 46% /re-assess 20%   Time 6   Period Weeks   Status Achieved   PT LONG TERM GOAL #4   Title Patient will improve bilateral hip strength by 1/2 MMT grade to improve ability to ambulate  Time 6   Period Weeks   Status Achieved   PT LONG TERM GOAL #5   Title Patient will demonstrate improved functional mobility with LEFS score of > 50/80   Baseline 32/80 re-assess 43/80   Time 6   Period Weeks   Status Partially Met   PT LONG TERM GOAL #6   Title Patient will demonstrate improved gait speed with 6MW test at > 1500'   Baseline 1050'/re-assess 1200'   Time 6   Period Weeks   Status Partially Met   PT LONG TERM GOAL #7   Title Patient will demonstrate improved functional independence and decreased disability with improved mod. osewstry score of <30%   Baseline 46% now 20%   Time 6   Period Weeks   Status Achieved               Plan - 01/19/15 1222    Clinical Impression Statement Patient is progressing to community exercise and water exercises, however she is found to have  muscle knots in paraspinal muscles and is very tender over distal scar.    Pt will benefit from skilled therapeutic intervention in order to improve on the following deficits Decreased activity tolerance;Pain;Difficulty walking;Impaired flexibility   Rehab Potential Good   PT Frequency 1x / week   PT Duration 6 weeks   PT Treatment/Interventions Therapeutic exercise;Scar mobilization;Manual techniques   PT Home Exercise Plan to see patient 1-2 x per week for maunal therapy techniques for pain, tightness, progressing to lumbar stabilization when able.    Consulted and Agree with Plan of Care Patient        Problem List There are no active problems to display for this patient.   Conetta,Kristyn, PT, MPT 01/19/2015, 12:31 PM  [facility] [address] [place], [place], [address] Phone: [phone]   Fax:  [phone]      

## 2015-01-27 DIAGNOSIS — R350 Frequency of micturition: Secondary | ICD-10-CM | POA: Insufficient documentation

## 2015-02-01 ENCOUNTER — Other Ambulatory Visit: Payer: Self-pay | Admitting: Unknown Physician Specialty

## 2015-02-01 DIAGNOSIS — D333 Benign neoplasm of cranial nerves: Secondary | ICD-10-CM

## 2015-02-02 ENCOUNTER — Ambulatory Visit: Payer: Medicare Other | Admitting: Physical Therapy

## 2015-02-02 DIAGNOSIS — IMO0001 Reserved for inherently not codable concepts without codable children: Secondary | ICD-10-CM

## 2015-02-02 DIAGNOSIS — M6281 Muscle weakness (generalized): Secondary | ICD-10-CM | POA: Diagnosis not present

## 2015-02-02 DIAGNOSIS — Z981 Arthrodesis status: Secondary | ICD-10-CM

## 2015-02-02 NOTE — Therapy (Signed)
[place] [facility] [address], [place], [address] Phone: [phone]   Fax:  [phone]  Physical Therapy Treatment  Patient Details  Name: Kendra Benton MRN: [id number] Date of Birth: 1940-09-18 Referring Provider:  Zollie Beckers, MD  Encounter Date: 02/02/2015      PT End of Session - 02/02/15 1309    Visit Number 8   Number of Visits 14   Date for PT Re-Evaluation 02/12/15   Authorization Type 8   Authorization Time Period 10   PT Start Time 1030   PT Stop Time 1115   PT Time Calculation (min) 45 min   Activity Tolerance Patient tolerated treatment well;No increased pain      Past Medical History  Diagnosis Date  . Osteoporosis   . Allergy     Past Surgical History  Procedure Laterality Date  . Spine surgery      There were no vitals filed for this visit.  Visit Diagnosis:  S/P lumbar fusion  Low back syndrome      Subjective Assessment - 02/02/15 1307    Subjective Patient reports continued low back pain, but has improved. She reports continued community aquatic exercise and land exercise. Reports on Sept. 3rd she can start weaning from brace.    Limitations Walking;House hold activities   Patient Stated Goals Patient would like to have decreased discomfort and return to prior funcitonal activities.    Currently in Pain? Yes   Pain Score 2    Pain Location Back   Pain Orientation Lower   Pain Type Chronic pain   Pain Frequency Intermittent   Multiple Pain Sites No        Treatment: STM to lumbar spine for pain and tenderness bilaterally. Scar massage.  Instructed patient in new exercises to include: supine bridging, prone alternating opposite arms and leg lifts.  Discussed continuing other exercises she is currently performing, heel slides, SK[employer], gym, aquatics etc.              PT Education - 02/02/15 1308    Education provided Yes   Education Details  progressing with some core strengthening. Added bridging, alternating UE/LE in prone.    Person(s) Educated Patient   Methods Explanation   Comprehension Verbalized understanding             PT Long Term Goals - 01/19/15 1224    PT LONG TERM GOAL #1   Title Patient will be independent with her home exercise program to improve ability to stand and walk.    Time 6   Period Weeks   Status Achieved   PT LONG TERM GOAL #2   Title Patient will report 5/10 or less back and LE pain at worst to promote ability to stand and walk   Time 6   Period Weeks   Status Achieved   PT LONG TERM GOAL #3   Title Patient will improve at least 6 points on Modified Oswestry Low back pain disability questionnaire.   Baseline 46% /re-assess 20%   Time 6   Period Weeks   Status Achieved   PT LONG TERM GOAL #4   Title Patient will improve bilateral hip strength by 1/2 MMT grade to improve ability to ambulate   Time 6   Period Weeks   Status Achieved   PT LONG TERM GOAL #5   Title Patient will demonstrate improved functional mobility with LEFS score of > 50/80   Baseline  32/80 re-assess 43/80   Time 6   Period Weeks   Status Partially Met   PT LONG TERM GOAL #6   Title Patient will demonstrate improved gait speed with 6MW test at > 1500'   Baseline 1050'/re-assess 1200'   Time 6   Period Weeks   Status Partially Met   PT LONG TERM GOAL #7   Title Patient will demonstrate improved functional independence and decreased disability with improved mod. osewstry score of <30%   Baseline 46% now 20%   Time 6   Period Weeks   Status Achieved               Plan - 02/02/15 1310    Clinical Impression Statement Patient progressing well with mobility, continues to progress with community activities. Pt continues to have LBP/soreness.    Rehab Potential Good   PT Frequency 1x / week   PT Duration 6 weeks   PT Treatment/Interventions Therapeutic exercise;Scar mobilization;Manual techniques    PT Home Exercise Plan to see patient 1-2 x per week for maunal therapy techniques for pain, tightness, progressing to lumbar stabilization when able.    Consulted and Agree with Plan of Care Patient        Problem List There are no active problems to display for this patient.   Miriam Liles, PT, MPT 02/02/2015, 1:13 PM  [place] [facility] [address], [place], [address] Phone: [phone]   Fax:  [phone]

## 2015-02-08 ENCOUNTER — Ambulatory Visit
Admission: RE | Admit: 2015-02-08 | Discharge: 2015-02-08 | Disposition: A | Discharge status: Home / Self Care | Payer: Medicare Other | Source: Ambulatory Visit | Attending: Unknown Physician Specialty | Admitting: Unknown Physician Specialty

## 2015-02-08 ENCOUNTER — Ambulatory Visit: Payer: Medicare Other | Attending: Neurosurgery | Admitting: Physical Therapy

## 2015-02-08 DIAGNOSIS — G9389 Other specified disorders of brain: Secondary | ICD-10-CM | POA: Diagnosis not present

## 2015-02-08 DIAGNOSIS — M545 Low back pain: Secondary | ICD-10-CM | POA: Insufficient documentation

## 2015-02-08 DIAGNOSIS — D333 Benign neoplasm of cranial nerves: Secondary | ICD-10-CM | POA: Insufficient documentation

## 2015-02-08 DIAGNOSIS — H903 Sensorineural hearing loss, bilateral: Secondary | ICD-10-CM | POA: Diagnosis present

## 2015-02-08 DIAGNOSIS — M6281 Muscle weakness (generalized): Secondary | ICD-10-CM | POA: Insufficient documentation

## 2015-02-08 DIAGNOSIS — Z981 Arthrodesis status: Secondary | ICD-10-CM | POA: Insufficient documentation

## 2015-02-08 MED ORDER — GADOBENATE DIMEGLUMINE 529 MG/ML IV SOLN
15.0000 mL | Freq: Once | INTRAVENOUS | Status: AC | PRN
  Administered 2015-02-08: 12 mL via INTRAVENOUS

## 2015-02-15 ENCOUNTER — Ambulatory Visit: Payer: Medicare Other | Admitting: Physical Therapy

## 2015-02-15 DIAGNOSIS — M545 Low back pain: Secondary | ICD-10-CM | POA: Diagnosis present

## 2015-02-15 DIAGNOSIS — IMO0001 Reserved for inherently not codable concepts without codable children: Secondary | ICD-10-CM

## 2015-02-15 DIAGNOSIS — Z981 Arthrodesis status: Secondary | ICD-10-CM

## 2015-02-15 DIAGNOSIS — M6281 Muscle weakness (generalized): Secondary | ICD-10-CM | POA: Diagnosis present

## 2015-02-15 NOTE — Therapy (Signed)
[facility] [address], [place], [address] Phone: [phone]   Fax:  [phone]  Physical Therapy Treatment/Discharge Note  Patient Details  Name: Kendra Benton MRN: [id number] Date of Birth: 05-18-1941 Referring Provider:  Zollie Beckers, MD  Encounter Date: 02/15/2015      PT End of Session - 02/15/15 1728    Visit Number 9   Number of Visits 14   Date for PT Re-Evaluation 02/12/15   Authorization Type 9   Authorization Time Period 10   PT Start Time 1696   PT Stop Time 1625   PT Time Calculation (min) 40 min   Activity Tolerance Patient tolerated treatment well;No increased pain   Behavior During Therapy [facility] for tasks assessed/performed      Past Medical History  Diagnosis Date  . Osteoporosis   . Allergy     Past Surgical History  Procedure Laterality Date  . Spine surgery      There were no vitals filed for this visit.  Visit Diagnosis:  S/P lumbar fusion - Plan: PT plan of care cert/re-cert  Muscle weakness (generalized) - Plan: PT plan of care cert/re-cert  Low back syndrome - Plan: PT plan of care cert/re-cert      Subjective Assessment - 02/15/15 1726    Subjective Patient reports she is doing better, but states that when she walks on the treadmill she has pain afterward.    Limitations House hold activities;Lifting   Patient Stated Goals Patient would like to have decreased discomfort and return to prior funcitonal activities.    Currently in Pain? No/denies        Treatment to include:  STM to lumbar spine and scar massage.  Standing shoulder retraction, Shoulder extension with red TB x 10 reps.  Lat pull downs with 10# x 10 reps.  Quadruped alternating arms and legs x10 reps.            PT Education - 02/15/15 1727    Education provided Yes   Education Details progressing with cardio activities at gym. starting yoga.    Person(s) Educated Patient    Methods Explanation   Comprehension Verbalized understanding             PT Long Term Goals - 02/15/15 1731    PT LONG TERM GOAL #5   Title Patient will demonstrate improved functional mobility with LEFS score of > 50/80   Baseline 32/80 re-assess 43/80   Time 6   Period Weeks   Status Achieved   PT LONG TERM GOAL #6   Title Patient will demonstrate improved gait speed with 6MW test at > 1500'   Baseline 1050'/re-assess 1200'   Time 6   Period Weeks   Status Achieved   PT LONG TERM GOAL #7   Title Patient will demonstrate improved functional independence and decreased disability with improved mod. osewstry score of <30%   Baseline 46% now 20%   Time 6   Period Weeks   Status Achieved               Plan - 02/15/15 1730    Clinical Impression Statement Patient is making great progress with functional mobility and exercise. She is ready to continue progressing on her own.    Pt will benefit from skilled therapeutic intervention in order to improve on the following deficits Decreased activity tolerance;Pain;Difficulty walking;Impaired flexibility   Rehab Potential Good   PT Treatment/Interventions Therapeutic exercise;Scar mobilization;Manual  techniques   Consulted and Agree with Plan of Care Patient          G-Codes - 2015/03/04 1733    Functional Assessment Tool Used 6MW, Oswestry, LEFS   Functional Limitation Mobility: Walking and moving around   Mobility: Walking and Moving Around Current Status [id number]) At least 1 percent but less than 20 percent impaired, limited or restricted   Mobility: Walking and Moving Around Goal Status [phone]) At least 1 percent but less than 20 percent impaired, limited or restricted   Mobility: Walking and Moving Around Discharge Status [phone]) At least 1 percent but less than 20 percent impaired, limited or restricted      Problem List There are no active problems to display for this patient.   Prinston Kynard, PT,  MPT Mar 04, 2015, 5:36 PM  [facility] [address] [place], [address] Phone: [phone]   Fax:  [phone]

## 2016-02-27 ENCOUNTER — Emergency Department: Payer: Medicare Other

## 2016-02-27 ENCOUNTER — Encounter: Payer: Self-pay | Admitting: *Deleted

## 2016-02-27 ENCOUNTER — Observation Stay
Admission: EM | Admit: 2016-02-27 | Discharge: 2016-02-29 | Disposition: A | Discharge status: Home / Self Care | Payer: Medicare Other | Attending: Surgery | Admitting: Surgery

## 2016-02-27 DIAGNOSIS — M810 Age-related osteoporosis without current pathological fracture: Secondary | ICD-10-CM | POA: Insufficient documentation

## 2016-02-27 DIAGNOSIS — I700 Atherosclerosis of aorta: Secondary | ICD-10-CM | POA: Insufficient documentation

## 2016-02-27 DIAGNOSIS — R109 Unspecified abdominal pain: Secondary | ICD-10-CM

## 2016-02-27 DIAGNOSIS — Z8711 Personal history of peptic ulcer disease: Secondary | ICD-10-CM | POA: Insufficient documentation

## 2016-02-27 DIAGNOSIS — K589 Irritable bowel syndrome without diarrhea: Secondary | ICD-10-CM | POA: Insufficient documentation

## 2016-02-27 DIAGNOSIS — I10 Essential (primary) hypertension: Secondary | ICD-10-CM | POA: Diagnosis not present

## 2016-02-27 DIAGNOSIS — N281 Cyst of kidney, acquired: Secondary | ICD-10-CM | POA: Insufficient documentation

## 2016-02-27 DIAGNOSIS — K8010 Calculus of gallbladder with chronic cholecystitis without obstruction: Principal | ICD-10-CM | POA: Insufficient documentation

## 2016-02-27 DIAGNOSIS — K760 Fatty (change of) liver, not elsewhere classified: Secondary | ICD-10-CM | POA: Diagnosis not present

## 2016-02-27 DIAGNOSIS — K819 Cholecystitis, unspecified: Secondary | ICD-10-CM

## 2016-02-27 DIAGNOSIS — I4510 Unspecified right bundle-branch block: Secondary | ICD-10-CM | POA: Insufficient documentation

## 2016-02-27 DIAGNOSIS — Z87891 Personal history of nicotine dependence: Secondary | ICD-10-CM | POA: Insufficient documentation

## 2016-02-27 DIAGNOSIS — Z90710 Acquired absence of both cervix and uterus: Secondary | ICD-10-CM | POA: Insufficient documentation

## 2016-02-27 DIAGNOSIS — K8020 Calculus of gallbladder without cholecystitis without obstruction: Secondary | ICD-10-CM

## 2016-02-27 HISTORY — DX: Essential (primary) hypertension: I10

## 2016-02-27 HISTORY — DX: Acute myocardial infarction, unspecified: I21.9

## 2016-02-27 LAB — CBC WITH DIFFERENTIAL/PLATELET
BASOS ABS: 0.1 10*3/uL (ref 0–0.1)
BASOS PCT: 1 %
EOS PCT: 1 %
Eosinophils Absolute: 0.1 10*3/uL (ref 0–0.7)
HEMATOCRIT: 43.4 % (ref 35.0–47.0)
Hemoglobin: 15 g/dL (ref 12.0–16.0)
LYMPHS PCT: 11 %
Lymphs Abs: 1.6 10*3/uL (ref 1.0–3.6)
MCH: 33.2 pg (ref 26.0–34.0)
MCHC: 34.5 g/dL (ref 32.0–36.0)
MCV: 96.3 fL (ref 80.0–100.0)
Monocytes Absolute: 0.7 10*3/uL (ref 0.2–0.9)
Monocytes Relative: 5 %
NEUTROS ABS: 11.6 10*3/uL — AB (ref 1.4–6.5)
Neutrophils Relative %: 82 %
PLATELETS: 312 10*3/uL (ref 150–440)
RBC: 4.51 MIL/uL (ref 3.80–5.20)
RDW: 14.1 % (ref 11.5–14.5)
WBC: 14.1 10*3/uL — AB (ref 3.6–11.0)

## 2016-02-27 LAB — URINALYSIS COMPLETE WITH MICROSCOPIC (ARMC ONLY)
BACTERIA UA: NONE SEEN
Bilirubin Urine: NEGATIVE
Glucose, UA: NEGATIVE mg/dL
Ketones, ur: NEGATIVE mg/dL
Leukocytes, UA: NEGATIVE
Nitrite: NEGATIVE
PROTEIN: NEGATIVE mg/dL
RBC / HPF: NONE SEEN RBC/hpf (ref 0–5)
SPECIFIC GRAVITY, URINE: 1.013 (ref 1.005–1.030)
Squamous Epithelial / LPF: NONE SEEN
WBC UA: NONE SEEN WBC/hpf (ref 0–5)
pH: 7 (ref 5.0–8.0)

## 2016-02-27 LAB — COMPREHENSIVE METABOLIC PANEL
ALBUMIN: 4.4 g/dL (ref 3.5–5.0)
ALK PHOS: 89 U/L (ref 38–126)
ALT: 36 U/L (ref 14–54)
AST: 59 U/L — AB (ref 15–41)
Anion gap: 13 (ref 5–15)
BUN: 17 mg/dL (ref 6–20)
CALCIUM: 9.6 mg/dL (ref 8.9–10.3)
CO2: 21 mmol/L — AB (ref 22–32)
CREATININE: 0.69 mg/dL (ref 0.44–1.00)
Chloride: 103 mmol/L (ref 101–111)
GFR calc Af Amer: >60 mL/min (ref >60)
GFR calc non Af Amer: >60 mL/min (ref >60)
GLUCOSE: 138 mg/dL — AB (ref 65–99)
Potassium: 3.4 mmol/L — ABNORMAL LOW (ref 3.5–5.1)
SODIUM: 137 mmol/L (ref 135–145)
Total Bilirubin: 0.8 mg/dL (ref 0.3–1.2)
Total Protein: 7.4 g/dL (ref 6.5–8.1)

## 2016-02-27 LAB — TROPONIN I: Troponin I: <0.03 ng/mL (ref <0.03)

## 2016-02-27 LAB — LACTIC ACID, PLASMA
Lactic Acid, Venous: 2.9 mmol/L (ref 0.5–1.9)
Lactic Acid, Venous: 1.5 mmol/L (ref 0.5–1.9)

## 2016-02-27 LAB — LIPASE, BLOOD: LIPASE: 37 U/L (ref 11–51)

## 2016-02-27 MED ORDER — DULOXETINE HCL 60 MG PO CPEP
60.0000 mg | ORAL_CAPSULE | Freq: Every day | ORAL | Status: DC
  Administered 2016-02-27 – 2016-02-28 (×2): 60 mg via ORAL
  Filled 2016-02-27 (×2): qty 1

## 2016-02-27 MED ORDER — MORPHINE SULFATE (PF) 4 MG/ML IV SOLN
4.0000 mg | INTRAVENOUS | Status: DC | PRN
  Administered 2016-02-28 – 2016-02-29 (×2): 4 mg via INTRAVENOUS
  Filled 2016-02-27 (×2): qty 1

## 2016-02-27 MED ORDER — HYDROCODONE-ACETAMINOPHEN 5-325 MG PO TABS: 1.0000 | ORAL_TABLET | ORAL | Status: DC | PRN

## 2016-02-27 MED ORDER — IOPAMIDOL (ISOVUE-300) INJECTION 61%
100.0000 mL | Freq: Once | INTRAVENOUS | Status: AC | PRN
  Administered 2016-02-27: 100 mL via INTRAVENOUS

## 2016-02-27 MED ORDER — PANTOPRAZOLE SODIUM 40 MG PO TBEC
40.0000 mg | DELAYED_RELEASE_TABLET | Freq: Every day | ORAL | Status: DC
  Administered 2016-02-27: 40 mg via ORAL
  Filled 2016-02-27: qty 1

## 2016-02-27 MED ORDER — ONDANSETRON HCL 4 MG/2ML IJ SOLN: 4.0000 mg | Freq: Four times a day (QID) | INTRAMUSCULAR | Status: DC | PRN

## 2016-02-27 MED ORDER — ONDANSETRON 4 MG PO TBDP: 4.0000 mg | ORAL_TABLET | Freq: Four times a day (QID) | ORAL | Status: DC | PRN

## 2016-02-27 MED ORDER — AMLODIPINE BESYLATE 5 MG PO TABS: 5.0000 mg | ORAL_TABLET | Freq: Every day | ORAL | Status: DC

## 2016-02-27 MED ORDER — MORPHINE SULFATE (PF) 4 MG/ML IV SOLN
4.0000 mg | Freq: Once | INTRAVENOUS | Status: AC
  Administered 2016-02-27: 4 mg via INTRAVENOUS
  Filled 2016-02-27: qty 1

## 2016-02-27 MED ORDER — ACETAMINOPHEN 650 MG RE SUPP: 650.0000 mg | Freq: Four times a day (QID) | RECTAL | Status: DC | PRN

## 2016-02-27 MED ORDER — TEMAZEPAM 15 MG PO CAPS
30.0000 mg | ORAL_CAPSULE | Freq: Every day | ORAL | Status: DC
  Administered 2016-02-27 – 2016-02-28 (×2): 30 mg via ORAL
  Filled 2016-02-27 (×2): qty 2
  Filled 2016-02-27: qty 4

## 2016-02-27 MED ORDER — TRIAMTERENE-HCTZ 37.5-25 MG PO TABS
1.0000 | ORAL_TABLET | Freq: Every day | ORAL | Status: DC
  Administered 2016-02-27 – 2016-02-28 (×2): 1 via ORAL
  Filled 2016-02-27 (×2): qty 1

## 2016-02-27 MED ORDER — ONDANSETRON HCL 4 MG/2ML IJ SOLN
4.0000 mg | Freq: Once | INTRAMUSCULAR | Status: AC
  Administered 2016-02-27: 4 mg via INTRAVENOUS
  Filled 2016-02-27: qty 2

## 2016-02-27 MED ORDER — KCL IN DEXTROSE-NACL 20-5-0.45 MEQ/L-%-% IV SOLN
INTRAVENOUS | Status: DC
  Administered 2016-02-27 – 2016-02-29 (×3): via INTRAVENOUS
  Filled 2016-02-27 (×5): qty 1000

## 2016-02-27 MED ORDER — ACETAMINOPHEN 325 MG PO TABS
ORAL_TABLET | ORAL | Status: AC
  Administered 2016-02-27: 650 mg via ORAL
  Filled 2016-02-27: qty 2

## 2016-02-27 MED ORDER — PIPERACILLIN-TAZOBACTAM 3.375 G IVPB
3.3750 g | Freq: Three times a day (TID) | INTRAVENOUS | Status: DC
  Administered 2016-02-27 – 2016-02-29 (×5): 3.375 g via INTRAVENOUS
  Filled 2016-02-27 (×5): qty 50

## 2016-02-27 MED ORDER — ENOXAPARIN SODIUM 40 MG/0.4ML ~~LOC~~ SOLN
40.0000 mg | SUBCUTANEOUS | Status: DC
  Administered 2016-02-27: 40 mg via SUBCUTANEOUS
  Filled 2016-02-27 (×2): qty 0.4

## 2016-02-27 MED ORDER — SODIUM CHLORIDE 0.9 % IV BOLUS (SEPSIS)
30.0000 mL/kg | Freq: Once | INTRAVENOUS | Status: AC
  Administered 2016-02-27: 1905 mL via INTRAVENOUS

## 2016-02-27 MED ORDER — ACETAMINOPHEN 325 MG PO TABS
650.0000 mg | ORAL_TABLET | Freq: Four times a day (QID) | ORAL | Status: DC | PRN
  Administered 2016-02-27 – 2016-02-28 (×3): 650 mg via ORAL
  Filled 2016-02-27 (×2): qty 2

## 2016-02-27 NOTE — ED Triage Notes (Signed)
Pt reports waking up at 0800 with chest pain and stomach pain, pt reports nausea

## 2016-02-27 NOTE — ED Provider Notes (Addendum)
[facility] Emergency Department Provider Note  ____________________________________________   I have reviewed the triage vital signs and the nursing notes.   HISTORY  Chief Complaint Chest Pain    HPI Kendra Benton is a 75 y.o. female who presents today complaining of epigastric and mid abdominal discomfort. Patient states she woke up with it this morning. 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 Today, she took an anti-spasmodic pill and it did not go away. She states the pain is actually gradually improving since she woke up. She states it started around 8:15 and she woke up with it. She does not know if it woke her up. She has had some normal bowel movements and no vomiting today. She denies any melena or bright red blood per rectum. She denies any fever or chills. She denies any tearing or radiation to the discomfort. It's a diffuse abdominal discomfort which she states is between the epigastric reason and the umbilicus principally. Nothing makes it worse, she has not tried to eat this morning. she has had anorexia today. Patient does have a remote history of appendectomy and hysterectomy. She did eat at home yesterday, she has no sick contacts or recent travel or recent antibiotics. The pain was not tearing. Is a cramping discomfort. It feels somewhat similar to her irritable bowel syndrome but the intensity and duration and the location seemed to be somewhat different going to her. This is vaguely described.      Past Medical History:  Diagnosis Date  . Allergy   . Osteoporosis     There are no active problems to display for this patient.   Past Surgical History:  Procedure Laterality Date  . SPINE SURGERY      Prior to Admission medications   Medication Sig Start Date End Date Taking?  Authorizing Provider  amLODipine-benazepril (LOTREL) 5-10 MG per capsule Take 1 capsule by mouth daily.    Historical Provider, MD  calcitonin, salmon, (MIACALCIN/FORTICAL) 200 UNIT/ACT nasal spray Place 1 spray into alternate nostrils daily.    Historical Provider, MD  DULoxetine (CYMBALTA) 60 MG capsule Take 60 mg by mouth daily.    Historical Provider, MD  omeprazole (PRILOSEC) 20 MG capsule Take 20 mg by mouth daily.    Historical Provider, MD  oxyCODONE-acetaminophen (PERCOCET) 10-325 MG per tablet Take 1 tablet by mouth every 4 (four) hours as needed for pain.    Historical Provider, MD  temazepam (RESTORIL) 30 MG capsule Take 30 mg by mouth at bedtime as needed for sleep.    Historical Provider, MD  traMADol (ULTRAM) 50 MG tablet Take by mouth every 6 (six) hours as needed.    Historical Provider, MD  triamterene-hydrochlorothiazide (DYAZIDE) 37.5-25 MG per capsule Take 1 capsule by mouth daily.    Historical Provider, MD    Allergies Latex  No family history on file.  Social History Social History  Substance Use Topics  . Smoking status: Former Smoker    Packs/day: 1.00    Years: 20.00    Types: Cigarettes  . Smokeless tobacco: Former Systems developer    Quit date: 10/10/1978  . Alcohol use 6.0 oz/week    10 Glasses of wine per week    Review of Systems Constitutional: No fever/chills Eyes: No visual changes. ENT: No sore throat. No stiff neck no neck pain Cardiovascular: Denies  chest pain. Respiratory: Denies shortness of breath. Gastrointestinal:   no vomiting.  No diarrhea.  No constipation. Genitourinary: Negative for dysuria. Musculoskeletal: Negative lower extremity swelling Skin: Negative for rash. Neurological: Negative for severe headaches, focal weakness or numbness. 10-point ROS otherwise negative.  ____________________________________________   PHYSICAL EXAM:  VITAL SIGNS: ED Triage Vitals  Enc Vitals Group     BP 02/27/16 1119 (!) 154/53     Pulse Rate  02/27/16 1119 64     Resp 02/27/16 1119 (!) 118     Temp 02/27/16 1119 97.7 F (36.5 C)     Temp Source 02/27/16 1119 Oral     SpO2 02/27/16 1119 96 %     Weight 02/27/16 1120 140 lb (63.5 kg)     Height 02/27/16 1120 '5\' 4"'$  (1.626 m)     Head Circumference --      Peak Flow --      Pain Score 02/27/16 1120 8     Pain Loc --      Pain Edu? --      Excl. in [place]? --     Constitutional: Alert and oriented. Well appearing and in no acute distress. Eyes: Conjunctivae are normal. PERRL. EOMI. Head: Atraumatic. Nose: No congestion/rhinnorhea. Mouth/Throat: Mucous membranes are moist.  Oropharynx non-erythematous. Neck: No stridor.   Nontender with no meningismus Cardiovascular: Normal rate, regular rhythm. Grossly normal heart sounds.  Good peripheral circulation. Respiratory: Normal respiratory effort.  No retractions. Lungs CTAB. Abdominal: Soft, no peritoneal signs, diffusely mildly tender especially in the epigastric right upper quadrant region but also noted in the left lower and mid abdomen as well.. No distention. No guarding no rebound Back:  There is no focal tenderness or step off.  there is no midline tenderness there are no lesions noted. there is no CVA tenderness Musculoskeletal: No lower extremity tenderness, no upper extremity tenderness. No joint effusions, no DVT signs strong distal pulses no edema Neurologic:  Normal speech and language. No gross focal neurologic deficits are appreciated.  Skin:  Skin is warm, dry and intact. No rash noted. Psychiatric: Mood and affect are normal. Speech and behavior are normal.  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 ____________________________________________   PROCEDURES  Procedure(s) performed: None  Procedures  Critical Care performed: None  ____________________________________________   INITIAL IMPRESSION / ASSESSMENT AND PLAN / ED COURSE  Pertinent labs & imaging results that were available during my care of the patient were reviewed by me and considered in my medical decision making (see chart for details).  Patient with recurrent abdominal discomfort in the past presents with something that she describes as more intense and lasting longer than normal. His epigastric. I do not palpate a AAA. She is not in atrial fibrillation low suspicion for ischemia. Given her age however we will institute a broad workup and give her antiemetics as well as analgesia. We will  check a lactate, lipase, CMP and CBC panel. Low suspicion for ACS but we will obtain a troponin as a screening test. After 4 hours of pain I do not think serial enzymes and be indicated given clinical presentation. Differential for abdominal pain of this vague  variety is somewhat broad. She has a nonsurgical abdomen at this time. Likely will require imaging giving her age  ----------------------------------------- 3:33 PM on 02/27/2016 -----------------------------------------  Patient well appearing at this time still with abdominal discomfort but not nearly as significant, resending her lactic acid no evidence of acute ischemic gut, more concerning is possible/likely cholecystitis. We have paged Dr. Pat Patrick,  who is in surgery and will come down after he is done.  ----------------------------------------- 5:28 PM on 02/27/2016 -----------------------------------------  Seen by Dr. Pat Patrick, who agrees w mgt and will admit.  Does not want abx.   Clinical Course   ____________________________________________   FINAL CLINICAL IMPRESSION(S) / ED DIAGNOSES  Final diagnoses:  None      This chart was dictated using voice recognition software.  Despite best efforts to proofread,  errors can occur which can change meaning.      Schuyler Amor, MD 02/27/16 1232    Schuyler Amor, MD 02/27/16 [place], MD 02/27/16 1728

## 2016-02-27 NOTE — H&P (Signed)
Kendra Benton is a 75 y.o. female  with approximately 12 hours of abdominal pain.  HPI: She was in her usual state of good health until early this morning when she awoke with sudden chest and upper abdominal pain. The pain was severe without radiation. She had some mild nausea but did not vomit. The pain did not improve despite multiple positions and heat. She presented the emergency for further evaluation.  She does have history of irritable bowel syndrome and has been on anti-spasmodic medications for a while. She took some anti-spasmodic medicine early this morning which did not improve her symptoms. She may have had similar symptoms although not as severe in the past. She denies any history of hepatitis yellow jaundice pancreatitis previous diagnosis of gallbladder disease or diverticulitis. Her last colonoscopy demonstrated diverticulosis. She does have history of peptic ulcer disease is young woman but no surgical intervention was performed. Her only previous surgeries are vaginal hysterectomy and appendectomy.  She is a long-standing history of severe spine problems having undergone 2 major procedures. She also has history of hypertension. She's followed by primary care group [facility]. Workup in the emergency room revealed slightly elevated white blood cell count. No significant peripheral function abnormality identified. CT scan ultrasound demonstrated what appeared to be some gallbladder wall thickening multiple stones and sludge consistent with acute cholecystitis. Surgical service was consulted.  Past Medical History:  Diagnosis Date  . Allergy   . Hypertension   . Osteoporosis    Past Surgical History:  Procedure Laterality Date  . SPINE SURGERY     Social History   Social History  . Marital status: Married    Spouse name: N/A  . Number of children: N/A  . Years of education: N/A   Social History Main Topics  . Smoking status: Former Smoker    Packs/day: 1.00    Years:  20.00    Types: Cigarettes  . Smokeless tobacco: Former Systems developer    Quit date: 10/10/1978  . Alcohol use 6.0 oz/week    10 Glasses of wine per week  . Drug use: No  . Sexual activity: Not Asked   Other Topics Concern  . None   Social History Narrative  . None     Review of Systems  Constitutional: Negative for chills and fever.  HENT: Negative.   Eyes: Negative.   Respiratory: Negative.   Cardiovascular: Positive for chest pain. Negative for palpitations.  Gastrointestinal: Positive for abdominal pain, heartburn and nausea. Negative for constipation, diarrhea and vomiting.  Genitourinary: Negative.   Musculoskeletal: Positive for back pain and neck pain.  Skin: Negative.   Neurological: Negative.   Psychiatric/Behavioral: Negative.      PHYSICAL EXAM: BP (!) 151/69   Pulse 63   Temp 97.7 F (36.5 C) (Oral)   Resp 16   Ht '5\' 4"'$  (1.626 m)   Wt 63.5 kg (140 lb)   SpO2 97%   BMI 24.03 kg/m   Physical Exam  Constitutional: She is oriented to person, place, and time. She appears well-developed and well-nourished. No distress.  HENT:  Head: Normocephalic and atraumatic.  Eyes: EOM are normal. Pupils are equal, round, and reactive to light.  Neck: Normal range of motion. Neck supple.  Cardiovascular: Normal rate and regular rhythm.   No murmur heard. Pulmonary/Chest: Effort normal and breath sounds normal.  Abdominal: Soft. Bowel sounds are normal. She exhibits no distension. There is tenderness. There is guarding. There is no rebound.  Musculoskeletal: Normal range of  motion. She exhibits edema. She exhibits no deformity.  Neurological: She is alert and oriented to person, place, and time.  Skin: Skin is warm and dry. She is not diaphoretic.  Psychiatric: She has a normal mood and affect. Her behavior is normal.   Her abdomen is soft without distention but she does has right upper quadrant tenderness and some mild guarding. She has active bowel sounds.  Impression/Plan:  I independently reviewed her CT scan or ultrasound. Working diagnosis here would be cholecystitis with cholelithiasis. We discussed the options available to her in detail. She had her family 1 discussed the possible choices. We'll hospitalize her tonight place her on IV antibiotics rehydrate her and make a decision regarding further intervention. She is in agreement with this plan.   Dia Crawford III, MD  02/27/2016, 5:31 PM

## 2016-02-27 NOTE — ED Notes (Signed)
Patient transported to US 

## 2016-02-27 NOTE — ED Notes (Signed)
Patient transported to CT 

## 2016-02-27 NOTE — ED Notes (Signed)
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 Denies being on any blood thinners.

## 2016-02-28 ENCOUNTER — Observation Stay: Payer: Medicare Other | Admitting: Anesthesiology

## 2016-02-28 ENCOUNTER — Observation Stay: Payer: Medicare Other

## 2016-02-28 ENCOUNTER — Encounter
Admission: EM | Disposition: A | Discharge status: Home / Self Care | Payer: Self-pay | Source: Home / Self Care | Attending: Emergency Medicine

## 2016-02-28 ENCOUNTER — Encounter: Payer: Self-pay | Admitting: Anesthesiology

## 2016-02-28 DIAGNOSIS — K8010 Calculus of gallbladder with chronic cholecystitis without obstruction: Secondary | ICD-10-CM | POA: Diagnosis not present

## 2016-02-28 HISTORY — PX: CHOLECYSTECTOMY: SHX55

## 2016-02-28 LAB — COMPREHENSIVE METABOLIC PANEL
ALBUMIN: 3.7 g/dL (ref 3.5–5.0)
ALK PHOS: 88 U/L (ref 38–126)
ALT: 63 U/L — AB (ref 14–54)
AST: 55 U/L — AB (ref 15–41)
Anion gap: 4 — ABNORMAL LOW (ref 5–15)
BUN: 10 mg/dL (ref 6–20)
CALCIUM: 8.9 mg/dL (ref 8.9–10.3)
CHLORIDE: 107 mmol/L (ref 101–111)
CO2: 31 mmol/L (ref 22–32)
CREATININE: 0.77 mg/dL (ref 0.44–1.00)
GFR calc non Af Amer: >60 mL/min (ref >60)
GLUCOSE: 106 mg/dL — AB (ref 65–99)
Potassium: 3.8 mmol/L (ref 3.5–5.1)
SODIUM: 142 mmol/L (ref 135–145)
Total Bilirubin: 0.7 mg/dL (ref 0.3–1.2)
Total Protein: 6.4 g/dL — ABNORMAL LOW (ref 6.5–8.1)

## 2016-02-28 LAB — CBC
HCT: 41.2 % (ref 35.0–47.0)
Hemoglobin: 14 g/dL (ref 12.0–16.0)
MCH: 33.6 pg (ref 26.0–34.0)
MCHC: 34 g/dL (ref 32.0–36.0)
MCV: 98.6 fL (ref 80.0–100.0)
PLATELETS: 261 10*3/uL (ref 150–440)
RBC: 4.18 MIL/uL (ref 3.80–5.20)
RDW: 14.4 % (ref 11.5–14.5)
WBC: 6.1 10*3/uL (ref 3.6–11.0)

## 2016-02-28 SURGERY — LAPAROSCOPIC CHOLECYSTECTOMY WITH INTRAOPERATIVE CHOLANGIOGRAM
Anesthesia: General | Wound class: Clean Contaminated

## 2016-02-28 MED ORDER — LIDOCAINE HCL (CARDIAC) 20 MG/ML IV SOLN
INTRAVENOUS | Status: DC | PRN
  Administered 2016-02-28: 60 mg via INTRAVENOUS

## 2016-02-28 MED ORDER — HEPARIN SOD (PORK) LOCK FLUSH 100 UNIT/ML IV SOLN
INTRAVENOUS | Status: AC
  Administered 2016-02-28: 500 [IU]
  Filled 2016-02-28: qty 5

## 2016-02-28 MED ORDER — FENTANYL CITRATE (PF) 100 MCG/2ML IJ SOLN
INTRAMUSCULAR | Status: DC | PRN
  Administered 2016-02-28 (×2): 50 ug via INTRAVENOUS

## 2016-02-28 MED ORDER — BUPIVACAINE HCL (PF) 0.25 % IJ SOLN
INTRAMUSCULAR | Status: DC | PRN
  Administered 2016-02-28: 30 mL

## 2016-02-28 MED ORDER — ONDANSETRON HCL 4 MG/2ML IJ SOLN
INTRAMUSCULAR | Status: DC | PRN
Start: 2016-02-28 — End: 2016-02-28
  Administered 2016-02-28: 4 mg via INTRAVENOUS

## 2016-02-28 MED ORDER — LACTATED RINGERS IV SOLN
INTRAVENOUS | Status: DC
  Administered 2016-02-28: 100 mL/h via INTRAVENOUS

## 2016-02-28 MED ORDER — LACTATED RINGERS IV SOLN
INTRAVENOUS | Status: DC | PRN
  Administered 2016-02-28: 13:00:00 via INTRAVENOUS

## 2016-02-28 MED ORDER — KETOROLAC TROMETHAMINE 30 MG/ML IJ SOLN
INTRAMUSCULAR | Status: DC | PRN
  Administered 2016-02-28: 30 mg via INTRAVENOUS

## 2016-02-28 MED ORDER — SUGAMMADEX SODIUM 200 MG/2ML IV SOLN
INTRAVENOUS | Status: DC | PRN
  Administered 2016-02-28: 127 mg via INTRAVENOUS

## 2016-02-28 MED ORDER — MIDAZOLAM HCL 5 MG/5ML IJ SOLN
INTRAMUSCULAR | Status: DC | PRN
  Administered 2016-02-28 (×2): 1 mg via INTRAVENOUS

## 2016-02-28 MED ORDER — ROCURONIUM BROMIDE 100 MG/10ML IV SOLN
INTRAVENOUS | Status: DC | PRN
  Administered 2016-02-28: 35 mg via INTRAVENOUS
  Administered 2016-02-28: 5 mg via INTRAVENOUS

## 2016-02-28 MED ORDER — SUCCINYLCHOLINE CHLORIDE 20 MG/ML IJ SOLN
INTRAMUSCULAR | Status: DC | PRN
  Administered 2016-02-28: 100 mg via INTRAVENOUS

## 2016-02-28 MED ORDER — DEXAMETHASONE SODIUM PHOSPHATE 10 MG/ML IJ SOLN
INTRAMUSCULAR | Status: DC | PRN
  Administered 2016-02-28: 10 mg via INTRAVENOUS

## 2016-02-28 MED ORDER — ONDANSETRON HCL 4 MG/2ML IJ SOLN: 4.0000 mg | Freq: Once | INTRAMUSCULAR | Status: DC | PRN

## 2016-02-28 MED ORDER — IOTHALAMATE MEGLUMINE 60 % INJ SOLN
INTRAMUSCULAR | Status: DC | PRN
  Administered 2016-02-28: 10 mL

## 2016-02-28 MED ORDER — FENTANYL CITRATE (PF) 100 MCG/2ML IJ SOLN
25.0000 ug | INTRAMUSCULAR | Status: AC | PRN
  Administered 2016-02-28 (×6): 25 ug via INTRAVENOUS

## 2016-02-28 MED ORDER — PROPOFOL 10 MG/ML IV BOLUS
INTRAVENOUS | Status: DC | PRN
  Administered 2016-02-28: 150 mg via INTRAVENOUS

## 2016-02-28 SURGICAL SUPPLY — 42 items
APPLIER CLIP ROT 10 11.4 M/L (STAPLE) ×2
BAG COUNTER SPONGE EZ (MISCELLANEOUS) ×2 IMPLANT
CANISTER SUCT 1200ML W/VALVE (MISCELLANEOUS) ×2 IMPLANT
CATH REDDICK CHOLANGI 4FR 50CM (CATHETERS) ×2 IMPLANT
CHLORAPREP W/TINT 26ML (MISCELLANEOUS) ×2 IMPLANT
CLIP APPLIE ROT 10 11.4 M/L (STAPLE) ×1 IMPLANT
CONRAY 60ML FOR OR (MISCELLANEOUS) ×2 IMPLANT
DRAPE SHEET LG 3/4 BI-LAMINATE (DRAPES) ×2 IMPLANT
DRSG TEGADERM 2-3/8X2-3/4 SM (GAUZE/BANDAGES/DRESSINGS) ×8 IMPLANT
DRSG TELFA 3X8 NADH (GAUZE/BANDAGES/DRESSINGS) ×2 IMPLANT
ELECT REM PT RETURN 9FT ADLT (ELECTROSURGICAL) ×2
ELECTRODE REM PT RTRN 9FT ADLT (ELECTROSURGICAL) ×1 IMPLANT
GLOVE BIO SURGEON STRL SZ7.5 (GLOVE) IMPLANT
GLOVE INDICATOR 8.0 STRL GRN (GLOVE) IMPLANT
GLOVE PROTEXIS LATEX SZ 7.5 (GLOVE) ×2 IMPLANT
GLOVE SKINSENSE NS SZ6.5 (GLOVE) ×5
GLOVE SKINSENSE STRL SZ6.5 (GLOVE) ×5 IMPLANT
GOWN STRL REUS W/ TWL LRG LVL3 (GOWN DISPOSABLE) ×3 IMPLANT
GOWN STRL REUS W/TWL LRG LVL3 (GOWN DISPOSABLE) ×3
GRASPER SUT TROCAR 14GX15 (MISCELLANEOUS) ×2 IMPLANT
IRRIGATION STRYKERFLOW (MISCELLANEOUS) ×1 IMPLANT
IRRIGATOR STRYKERFLOW (MISCELLANEOUS) ×2
IV NS 1000ML (IV SOLUTION) ×1
IV NS 1000ML BAXH (IV SOLUTION) ×1 IMPLANT
LABEL OR SOLS (LABEL) ×2 IMPLANT
NDL SAFETY 18GX1.5 (NEEDLE) ×2 IMPLANT
NEEDLE HYPO 25X1 1.5 SAFETY (NEEDLE) ×2 IMPLANT
NEEDLE INSUFFLATION 14GA 120MM (NEEDLE) ×2 IMPLANT
NS IRRIG 500ML POUR BTL (IV SOLUTION) ×2 IMPLANT
PACK LAP CHOLECYSTECTOMY (MISCELLANEOUS) ×2 IMPLANT
POUCH ENDO CATCH 10MM SPEC (MISCELLANEOUS) ×2 IMPLANT
SCISSORS METZENBAUM CVD 33 (INSTRUMENTS) ×2 IMPLANT
SEAL FOR SCOPE WARMER C3101 (MISCELLANEOUS) IMPLANT
SLEEVE ADV FIXATION 5X100MM (TROCAR) ×2 IMPLANT
SUT ETHILON 5-0 FS-2 18 BLK (SUTURE) ×2 IMPLANT
SUT VIC AB 0 CT2 27 (SUTURE) ×4 IMPLANT
SYR 3ML LL SCALE MARK (SYRINGE) ×2 IMPLANT
TROCAR Z-THREAD FIOS 11X100 BL (TROCAR) ×2 IMPLANT
TROCAR Z-THREAD OPTICAL 5X100M (TROCAR) ×2 IMPLANT
TROCAR Z-THREAD SLEEVE 11X100 (TROCAR) ×2 IMPLANT
TUBING INSUFFLATOR HI FLOW (MISCELLANEOUS) ×2 IMPLANT
WATER STERILE IRR 1000ML POUR (IV SOLUTION) IMPLANT

## 2016-02-28 NOTE — Progress Notes (Signed)
Subjective:   She continues to have midepigastric pain but the chest pain and severity Markedly reduced. She notes that she's had these symptoms in the past and does not recognize them as potential biliary tract disease symptoms. We talked at length. She would like to consider surgical intervention.  Vital signs in last 24 hours: Temp:  [97.6 F (36.4 C)-98.5 F (36.9 C)] 97.7 F (36.5 C) (09/21 0536) Pulse Rate:  [55-77] 66 (09/21 0536) Resp:  [12-118] 17 (09/21 0536) BP: (124-156)/(53-76) 124/65 (09/21 0536) SpO2:  [94 %-100 %] 94 % (09/21 0536) Weight:  [63.5 kg (140 lb)] 63.5 kg (140 lb) (09/20 1120) Last BM Date: 02/27/16  Intake/Output from previous day: 09/20 0701 - 09/21 0700 In: 2638.2 [I.V.:696.6; IV Piggyback:1941.6] Out: 1800 [Urine:1800]  Exam:  Her exam is improved. Her abdomen is soft with no rebound but some mild guarding. Her lungs are clear with no adventitious sounds.  Lab Results:  CBC  Recent Labs  02/27/16 1135 02/28/16 0539  WBC 14.1* 6.1  HGB 15.0 14.0  HCT 43.4 41.2  PLT 312 261   CMP     Component Value Date/Time   NA 142 02/28/2016 0539   NA 130 (L) 06/23/2012 1038   K 3.8 02/28/2016 0539   K 3.7 06/23/2012 1038   CL 107 02/28/2016 0539   CL 93 (L) 06/23/2012 1038   CO2 31 02/28/2016 0539   CO2 31 06/23/2012 1038   GLUCOSE 106 (H) 02/28/2016 0539   GLUCOSE 103 (H) 06/23/2012 1038   BUN 10 02/28/2016 0539   BUN 17 06/23/2012 1038   CREATININE 0.77 02/28/2016 0539   CREATININE 0.71 12/27/2012 1459   CALCIUM 8.9 02/28/2016 0539   CALCIUM 9.2 06/23/2012 1038   PROT 6.4 (L) 02/28/2016 0539   ALBUMIN 3.7 02/28/2016 0539   AST 55 (H) 02/28/2016 0539   ALT 63 (H) 02/28/2016 0539   ALKPHOS 88 02/28/2016 0539   BILITOT 0.7 02/28/2016 0539   GFRNONAA >60 02/28/2016 0539   GFRNONAA >60 12/27/2012 1459   GFRAA >60 02/28/2016 0539   GFRAA >60 12/27/2012 1459   PT/INR No results for input(s): LABPROT, INR in the last 72  hours.  Studies/Results: Ct Abdomen Pelvis W Contrast  Result Date: 02/27/2016 CLINICAL DATA:  Chest and abdominal pain upon waking this morning. Nausea. EXAM: CT ABDOMEN AND PELVIS WITH CONTRAST TECHNIQUE: Multidetector CT imaging of the abdomen and pelvis was performed using the standard protocol following bolus administration of intravenous contrast. CONTRAST:  192m ISOVUE-300 IOPAMIDOL (ISOVUE-300) INJECTION 61% COMPARISON:  None. FINDINGS: Lower chest: Lung bases show mild scarring. Chronic calcified granuloma in the lateral lingula. No active chest process. Hepatobiliary: Normal appearance of the liver. Gallbladder is distended and there may be wall thickening. Suggest right upper quadrant ultrasound assess for acute cholecystitis. Pancreas: Normal Spleen: Normal Adrenals/Urinary Tract: Adrenal glands are normal. Right kidney is normal. Left kidney is normal except for a benign cyst at the upper pole measuring 4.9 cm in diameter. Stomach/Bowel: No acute gastrointestinal finding. Previous appendectomy. Diverticulosis without evidence of diverticulitis. Vascular/Lymphatic: Aortic atherosclerosis. No aneurysm. The IVC is normal. No retroperitoneal mass or lymphadenopathy. Reproductive: Previous hysterectomy.  No pelvic mass. Other: No ascites or free air.  No hernia. Musculoskeletal: Previous decompression, diskectomy and fusion from L3 through L5. Old compression deformity of L2 as seen previously. IMPRESSION: The gallbladder is distended and questionably thick walled. Could the patient have acute cholecystitis? Right upper quadrant ultrasound could be performed to assess the gallbladder more accurately  if that is a clinical possibility. Aortic atherosclerosis. Benign appearing left renal cyst. Chronic spinal findings. Electronically Signed   By: Nelson Chimes M.D.   On: 02/27/2016 13:51   US Abdomen Limited Ruq  Result Date: 02/27/2016 CLINICAL DATA:  Right upper quadrant pain. EXAM: US ABDOMEN LIMITED  - RIGHT UPPER QUADRANT COMPARISON:  CT 02/27/2016. FINDINGS: Gallbladder: Multiple gallstones noted, the largest measures 1.3 cm. Sludge is noted. Gallbladder wall thickening to 4.4 mm noted. Cholecystitis cannot be excluded. Common bile duct: Diameter: 3.1 mm Liver: Liver is slightly echogenic consistent fatty infiltration and/or hepatocellular disease. Focal fatty sparing noted adjacent to the gallbladder fossa. IMPRESSION: 1. Multiple gallstones, the largest measures 1.3 cm. Sludge noted. Gallbladder wall is thickened at 4.4 mm. Cholecystitis cannot be excluded. No biliary distention. 2. Liver is echogenic consistent fatty infiltration and/or hepatocellular disease. Focal fatty sparing adjacent to the gallbladder fossa noted. Electronically Signed   By: Marcello Moores  Register   On: 02/27/2016 14:38    Assessment/Plan: Her laboratory values have improved. Overall she seems to be much better. With her persistent symptoms and the history of similar symptoms we will recommend surgical intervention. We plan laparoscopic cholecystectomy later today. She is in agreement.

## 2016-02-28 NOTE — Op Note (Signed)
02/27/2016 - 02/28/2016  1:59 PM  PATIENT:  Kendra Benton  75 y.o. female  PRE-OPERATIVE DIAGNOSIS:  cholecystitis  POST-OPERATIVE DIAGNOSIS:  cholecystitis  PROCEDURE:  Procedure(s): LAPAROSCOPIC CHOLECYSTECTOMY WITH INTRAOPERATIVE CHOLANGIOGRAM (N/A)  SURGEON:  Surgeon(s) and Role:    * Jeanie Cooks, MD - Primary   ASSISTANTS: none   ANESTHESIA:   general  EBL:  Total I/O In: 300 [I.V.:300] Out: -    DRAINS: none   LOCAL MEDICATIONS USED:  MARCAINE      DISPOSITION OF SPECIMEN:  PATHOLOGY   DICTATION: .Dragon Dictation  With the patient supine position and after induction of appropriate general anesthesia the patient's abdomen was prepped ChloraPrep and draped sterile towels. The patient is placed headdown feet up position. A small infraumbilical incision was made standard fashion carried down bluntly through subcutaneous tissue. A varies needle was used cannulate peritoneal cavity. CO2 was insufflated to appropriate pressure measurements. When approximately 2 L of CO2 were instilled a varies needle was withdrawn and an 11 mm port placed into the peritoneal cavity. Intraperitoneal position was confirmed and CO2 was reinsufflated.  The patient's place head up feet down position rotated slightly to the left side. Subxiphoid transverse incision was made 11 mm port inserted under direct vision. Gallbladder was distended discolored with multiple adhesions but no evidence of any thickening or acute cholecystitis changes other than edema. The gallbladder was aspirated of about 75 cc of normal colored bile. Stones were visible through the gallbladder wall.  The gallbladder was elevated superiorly and laterally exposing the hepatoduodenal ligament. Cystic artery and cystic duct were identified. Cystic duct clipped on the gallbladder side and opened. An on table cholangiogram using dynamic fluoroscopy was performed without difficulty. Intrahepatic radicals were seen. There is no  obstruction noted. There was some narrowing of the distal duct with what appeared to be a possible diverticulum. No obstruction was visualized. The catheter was removed and the cystic duct doubly clipped on the common duct side. Cystic duct was divided.  Cystic artery was doubly clipped and divided. The gallbladder was then dissected free from its bed in the liver used no cautery apparatus. Once the gallbladder was free was captured Endo Catch apparatus removed through the subxiphoid incision without difficulty. The abdomen was then irrigated with warm saline solution. The upper midline incision was closed with figure-of-eight suture of 0 Vicryl using the suture passer. The abdomen was desufflated. Midline fascia was closed with figure-of-eight sutures 0 Vicryl under direct vision. Skin incisions were closed with 5-0 nylon. The area was infiltrated with 0.5% Marcaine for postoperative pain control. Sterile dressings were applied. Patient returned recovery room having tolerated procedure well. Sponge instrument needle count were correct 2 in the operating room.  PLAN OF CARE: Admit to inpatient   PATIENT DISPOSITION:  PACU - hemodynamically stable.   Dia Crawford III, MD

## 2016-02-28 NOTE — Anesthesia Preprocedure Evaluation (Addendum)
Anesthesia Evaluation  Patient identified by MRN, date of birth, ID band Patient awake    Reviewed: Allergy & Precautions, NPO status , Patient's Chart, lab work & pertinent test results  Airway Mallampati: III  TM Distance: <3 FB     Dental  (+) Chipped, Caps   Pulmonary former smoker,    Pulmonary exam normal        Cardiovascular hypertension, Pt. on medications + Past MI  Normal cardiovascular exam     Neuro/Psych negative neurological ROS  negative psych ROS   GI/Hepatic Neg liver ROS, cholecystitis   Endo/Other  negative endocrine ROS  Renal/GU negative Renal ROS  negative genitourinary   Musculoskeletal osteoporosis   Abdominal Normal abdominal exam  (+)   Peds negative pediatric ROS (+)  Hematology negative hematology ROS (+)   Anesthesia Other Findings   Reproductive/Obstetrics                            Anesthesia Physical Anesthesia Plan  ASA: III  Anesthesia Plan: General   Post-op Pain Management:    Induction: Intravenous  Airway Management Planned: Oral ETT  Additional Equipment:   Intra-op Plan:   Post-operative Plan: Extubation in OR  Informed Consent: I have reviewed the patients History and Physical, chart, labs and discussed the procedure including the risks, benefits and alternatives for the proposed anesthesia with the patient or authorized representative who has indicated his/her understanding and acceptance.   Dental advisory given  Plan Discussed with: CRNA and Surgeon  Anesthesia Plan Comments:        Anesthesia Quick Evaluation

## 2016-02-28 NOTE — Plan of Care (Addendum)
Patient report given to OR. Consent and sage prep X2 completed.  Patient has been NPO since midnight and had no beta blockers to be given.  VS stable.  Tylenol given with small sip water for pain. Teds and SCD in place.

## 2016-02-28 NOTE — Anesthesia Procedure Notes (Signed)
Procedure Name: Intubation Date/Time: 02/28/2016 1:04 PM Performed by: Dionne Bucy Pre-anesthesia Checklist: Patient identified, Patient being monitored, Timeout performed, Emergency Drugs available and Suction available Patient Re-evaluated:Patient Re-evaluated prior to inductionOxygen Delivery Method: Circle system utilized Preoxygenation: Pre-oxygenation with 100% oxygen Intubation Type: IV induction Ventilation: Mask ventilation without difficulty Laryngoscope Size: Mac and 3 Grade View: Grade II Tube type: Oral Tube size: 7.0 mm Number of attempts: 1 Placement Confirmation: ETT inserted through vocal cords under direct vision,  positive ETCO2 and breath sounds checked- equal and bilateral Secured at: 21 cm Tube secured with: Tape Dental Injury: Teeth and Oropharynx as per pre-operative assessment

## 2016-02-28 NOTE — Transfer of Care (Signed)
Immediate Anesthesia Transfer of Care Note  Patient: Dot Been  Procedure(s) Performed: Procedure(s): LAPAROSCOPIC CHOLECYSTECTOMY WITH INTRAOPERATIVE CHOLANGIOGRAM (N/A)  Patient Location: PACU  Anesthesia Type:General  Level of Consciousness: patient cooperative and lethargic  Airway & Oxygen Therapy: Patient Spontanous Breathing and Patient connected to face mask oxygen  Post-op Assessment: Report given to RN and Post -op Vital signs reviewed and stable  Post vital signs: Reviewed and stable  Last Vitals:  Vitals:   02/28/16 1213 02/28/16 1400  BP: 131/63 (!) 160/66  Pulse: (!) 59 60  Resp: 16 16  Temp: 36.8 C (!) 36 C    Last Pain:  Vitals:   02/28/16 1213  TempSrc: Tympanic  PainSc: 2       Patients Stated Pain Goal: 0 (74/12/87 8676)  Complications: No apparent anesthesia complications

## 2016-02-29 ENCOUNTER — Encounter: Payer: Self-pay | Admitting: Surgery

## 2016-02-29 DIAGNOSIS — K8010 Calculus of gallbladder with chronic cholecystitis without obstruction: Secondary | ICD-10-CM | POA: Diagnosis not present

## 2016-02-29 LAB — SURGICAL PATHOLOGY

## 2016-02-29 MED ORDER — OXYCODONE-ACETAMINOPHEN 5-325 MG PO TABS: 1.0000 | ORAL_TABLET | Freq: Four times a day (QID) | ORAL | Status: DC | PRN

## 2016-02-29 MED ORDER — OXYCODONE-ACETAMINOPHEN 5-325 MG PO TABS: 1.0000 | ORAL_TABLET | Freq: Four times a day (QID) | ORAL | 0 refills | Status: DC | PRN

## 2016-02-29 NOTE — Discharge Instructions (Signed)

## 2016-02-29 NOTE — Anesthesia Postprocedure Evaluation (Signed)
Anesthesia Post Note  Patient: Kendra Benton  Procedure(s) Performed: Procedure(s) (LRB): LAPAROSCOPIC CHOLECYSTECTOMY WITH INTRAOPERATIVE CHOLANGIOGRAM (N/A)  Patient location during evaluation: PACU Anesthesia Type: General Level of consciousness: awake and alert and oriented Pain management: pain level controlled Vital Signs Assessment: post-procedure vital signs reviewed and stable Respiratory status: spontaneous breathing Cardiovascular status: blood pressure returned to baseline Anesthetic complications: no    Last Vitals:  Vitals:   02/29/16 0421 02/29/16 0800  BP: 122/66 (!) 153/61  Pulse: 61 64  Resp: 18 18  Temp: 36.4 C     Last Pain:  Vitals:   02/29/16 0529  TempSrc:   PainSc: 5                  Krystall Kruckenberg

## 2016-02-29 NOTE — Progress Notes (Signed)
Patient A&O, VSS.  No complaints of pain.  Dressings intact.  Ambulating in room.  Discharge instructions reviewed in detail with patient and husband.  Understanding was verbalized and all questions were answered.  Patient discharged home via wheelchair in stable condition escorted by volunteer staff.

## 2016-02-29 NOTE — Discharge Summary (Signed)
Patient ID: Kendra Benton MRN: [id number] DOB/AGE: 77/05/1941 77 y.o.  Admit date: 02/27/2016 Discharge date: 02/29/2016  Discharge Diagnoses:  Cholecystitis with cholelithiasis  Procedures Performed: Laparoscopic cholecystectomy with cholangiography  Discharged Condition: good  Hospital Course: She was admitted with abdominal pain a clinical presentation and imaging studies consistent with possible cholecystitis and cholelithiasis. She was admitted treated with a antibiotics and improved. We'll offer her surgical intervention during this admission and she agreed. After appropriate preoperative preparation informed consent she was taken to surgery on the afternoon of September 21 where she underwent laparoscopic cholecystectomy and cholangiography. She's done well postoperatively. We'll discharge her home today to follow the office in 7-10 days time. Bathing activity drive instructions were given the patient.  Discharge Orders: Discharge Instructions    Call MD for:  persistant nausea and vomiting    Complete by:  As directed    Call MD for:  redness, tenderness, or signs of infection (pain, swelling, redness, odor or green/yellow discharge around incision site)    Complete by:  As directed    Call MD for:  severe uncontrolled pain    Complete by:  As directed    Call MD for:  temperature >100.4    Complete by:  As directed    Diet - low sodium heart healthy    Complete by:  As directed    Driving Restrictions    Complete by:  As directed    Do not drive while taking narcotic pain medication   Increase activity slowly    Complete by:  As directed    Lifting restrictions    Complete by:  As directed    Do not lift anything heavier than your dinner plate until you come back to the office   Other Restrictions    Complete by:  As directed    You may bathe tomorrow      Disposition: Final discharge disposition not confirmed  Discharge Medications:  Current  Facility-Administered Medications:  .  amLODipine (NORVASC) tablet 5 mg, 5 mg, Oral, Daily, Dia Crawford III, MD .  dextrose 5 % and 0.45 % NaCl with KCl 20 mEq/L infusion, , Intravenous, Continuous, Dia Crawford III, MD, Last Rate: 75 mL/hr at 02/29/16 0508 .  DULoxetine (CYMBALTA) DR capsule 60 mg, 60 mg, Oral, QHS, Dia Crawford III, MD, 60 mg at 02/28/16 2309 .  enoxaparin (LOVENOX) injection 40 mg, 40 mg, Subcutaneous, Q24H, Dia Crawford III, MD, 40 mg at 02/27/16 2228 .  morphine 4 MG/ML injection 4 mg, 4 mg, Intravenous, Q2H PRN, Dia Crawford III, MD, 4 mg at 02/29/16 0530 .  ondansetron (ZOFRAN-ODT) disintegrating tablet 4 mg, 4 mg, Oral, Q6H PRN **OR** ondansetron (ZOFRAN) injection 4 mg, 4 mg, Intravenous, Q6H PRN, Dia Crawford III, MD .  oxyCODONE-acetaminophen (PERCOCET/ROXICET) 5-325 MG per tablet 1-2 tablet, 1-2 tablet, Oral, Q6H PRN, Dia Crawford III, MD .  pantoprazole (PROTONIX) EC tablet 40 mg, 40 mg, Oral, Daily, Dia Crawford III, MD, 40 mg at 02/27/16 1949 .  piperacillin-tazobactam (ZOSYN) IVPB 3.375 g, 3.375 g, Intravenous, Q8H, Dia Crawford III, MD, 3.375 g at 02/29/16 0530 .  temazepam (RESTORIL) capsule 30 mg, 30 mg, Oral, QHS, Dia Crawford III, MD, 30 mg at 02/28/16 2309 .  triamterene-hydrochlorothiazide (MAXZIDE-25) 37.5-25 MG per tablet 1 tablet, 1 tablet, Oral, QHS, Dia Crawford III, MD, 1 tablet at 02/28/16 2309  Follwup: Follow-up Information    [facility] Follow up in 1 week(s).   Contact information: [address]  [place] [phone]          Signed: Dia Crawford III 02/29/2016, 7:38 AM

## 2016-03-04 ENCOUNTER — Other Ambulatory Visit: Payer: Self-pay

## 2016-03-06 ENCOUNTER — Encounter: Payer: Medicare Other | Admitting: General Surgery

## 2016-03-06 ENCOUNTER — Ambulatory Visit (INDEPENDENT_AMBULATORY_CARE_PROVIDER_SITE_OTHER): Payer: Medicare Other | Admitting: General Surgery

## 2016-03-06 ENCOUNTER — Encounter: Payer: Self-pay | Admitting: General Surgery

## 2016-03-06 VITALS — BP 141/76 | HR 65 | Temp 97.8°F | Ht 64.0 in | Wt 138.0 lb

## 2016-03-06 DIAGNOSIS — Z4889 Encounter for other specified surgical aftercare: Secondary | ICD-10-CM

## 2016-03-06 NOTE — Progress Notes (Signed)
Outpatient Surgical Follow Up  03/06/2016  Kendra Benton is an 75 y.o. female.   Chief Complaint  Patient presents with  . Routine Post Op    Post op Laparoscopic cholecystectomy with intraoperative cholangiogram 02/28/16 Dr. Pat Patrick    HPI:75 year old female presents to clinic for follow-up from a laparoscopic cholecystectomy performed last week. Having some soreness in the right upper quadrant and right flank however it is gradually improving. She is only taking a pain pill at night for sleep. She denies any fevers, chills, nausea, vomiting, chest pain, shortness of breath and has been very happy with her surgical experience this far.  Past Medical History:  Diagnosis Date  . Allergy   . Hypertension   . Myocardial infarction ([place])    1997  . Osteoporosis     Past Surgical History:  Procedure Laterality Date  . ABDOMINAL HYSTERECTOMY     patient has ovaries  . APPENDECTOMY    . CHOLECYSTECTOMY N/A 02/28/2016   Procedure: LAPAROSCOPIC CHOLECYSTECTOMY WITH INTRAOPERATIVE CHOLANGIOGRAM;  Surgeon: Dia Crawford III, MD;  Location: [facility] ORS;  Service: General;  Laterality: N/A;  . SPINE SURGERY     Disc    Family History  Problem Relation Age of Onset  . Heart disease Mother   . Arthritis Mother   . Cancer Mother 19    lymphoma  . Heart disease Father   . Cancer Father 67    prostate  . Cancer Sister 92    pancreatic    Social History:  reports that she has quit smoking. Her smoking use included Cigarettes. She has a 20.00 pack-year smoking history. She quit smokeless tobacco use about 37 years ago. She reports that she drinks about 6.0 oz of alcohol per week . She reports that she does not use drugs.  Allergies:  Allergies  Allergen Reactions  . Albuterol Palpitations  . Latex Rash    May be more adhesive allergy than latex.  Never tested    Medications reviewed.    ROS A multipoint review of systems was completed, all pertinent positives and negatives are  documented within the history of present illness and remainder are negative.   BP (!) 141/76   Pulse 65   Temp 97.8 F (36.6 C) (Oral)   Ht '5\' 4"'$  (1.626 m)   Wt 62.6 kg (138 lb)   BMI 23.69 kg/m   Physical Exam Gen.: No acute distress Chest: Clear to auscultation Heart: Regular rate and rhythm Abdomen: Soft, purple tender to palpation at incision sites, nondistended. Evidence of resolving ecchymosis around all of her incisions and to her right flank. Sutures and placed a laparoscopic incision sites on any evidence of erythema or purulence.    No results found for this or any previous visit (from the past 48 hour(s)). No results found.  Assessment/Plan:  1. Aftercare following surgery 75 year old female status post laparoscopic cholecystectomy. Pathology reviewed with the patient. Sutures removed and replaced with Steri-Strips applied difficulty. Provided patient with anticipated recovery as well as precautions for wound healing and return to activities. She voiced understanding and all questions were answered to her satisfaction. She will follow-up in clinic on an as-needed basis.     Clayburn Pert, MD FACS General Surgeon  03/06/2016,11:50 AM

## 2016-03-06 NOTE — Patient Instructions (Signed)
Please call our office with any questions or concerns.  Please do not submerge in a tub, hot tub, or pool until incisions are completely sealed.  Use sun block to incision area over the next year if this area will be exposed to sun. This helps decrease scarring.  You may now resume your normal activities. Listen to your body when lifting, if you have pain when lifting, stop and then try again in a few days.  If you develop redness, drainage, or pain at incision sites- call our office immediately and speak with a nurse.  Remember to eat non-fatty foods for another week.

## 2016-03-31 ENCOUNTER — Other Ambulatory Visit: Payer: Self-pay | Admitting: Orthopedic Surgery

## 2016-03-31 DIAGNOSIS — M5416 Radiculopathy, lumbar region: Secondary | ICD-10-CM

## 2016-04-17 ENCOUNTER — Ambulatory Visit
Admission: RE | Admit: 2016-04-17 | Discharge: 2016-04-17 | Disposition: A | Discharge status: Home / Self Care | Payer: Medicare Other | Source: Ambulatory Visit | Attending: Orthopedic Surgery | Admitting: Orthopedic Surgery

## 2016-04-17 DIAGNOSIS — M5127 Other intervertebral disc displacement, lumbosacral region: Secondary | ICD-10-CM | POA: Insufficient documentation

## 2016-04-17 DIAGNOSIS — M4807 Spinal stenosis, lumbosacral region: Secondary | ICD-10-CM | POA: Insufficient documentation

## 2016-04-17 DIAGNOSIS — M5416 Radiculopathy, lumbar region: Secondary | ICD-10-CM | POA: Diagnosis not present

## 2016-04-17 DIAGNOSIS — M4856XA Collapsed vertebra, not elsewhere classified, lumbar region, initial encounter for fracture: Secondary | ICD-10-CM | POA: Insufficient documentation

## 2016-10-29 ENCOUNTER — Ambulatory Visit: Payer: Medicare Other | Attending: Orthopedic Surgery

## 2016-10-29 DIAGNOSIS — M5416 Radiculopathy, lumbar region: Secondary | ICD-10-CM | POA: Diagnosis not present

## 2016-10-29 DIAGNOSIS — M545 Low back pain: Secondary | ICD-10-CM | POA: Diagnosis present

## 2016-10-29 DIAGNOSIS — G8929 Other chronic pain: Secondary | ICD-10-CM | POA: Diagnosis present

## 2016-10-29 DIAGNOSIS — M6281 Muscle weakness (generalized): Secondary | ICD-10-CM | POA: Diagnosis present

## 2016-10-29 NOTE — Therapy (Signed)
[facility] [address] [place], [address] Phone: [phone]   Fax:  [phone]  Physical Therapy Evaluation  Patient Details  Name: Kendra Benton MRN: [id number] Date of Birth: Sep 14, 1940 Referring Provider: Almedia Balls, MD  Encounter Date: 10/29/2016      PT End of Session - 10/29/16 1017    Visit Number 1   Number of Visits 13   Date for PT Re-Evaluation 12/11/16   Authorization Type 1   Authorization Time Period of 10 g-code   PT Start Time 1018   PT Stop Time 1122   PT Time Calculation (min) 64 min   Activity Tolerance Patient tolerated treatment well   Behavior During Therapy [facility] for tasks assessed/performed      Past Medical History:  Diagnosis Date  . Allergy   . Hypertension   . Myocardial infarction ([place])    1997  . Osteoporosis     Past Surgical History:  Procedure Laterality Date  . ABDOMINAL HYSTERECTOMY     patient has ovaries  . APPENDECTOMY    . CHOLECYSTECTOMY N/A 02/28/2016   Procedure: LAPAROSCOPIC CHOLECYSTECTOMY WITH INTRAOPERATIVE CHOLANGIOGRAM;  Surgeon: Dia Crawford III, MD;  Location: [facility] ORS;  Service: General;  Laterality: N/A;  . LUMBAR FUSION  11/09/2016   L3-L5  . SPINE SURGERY     Disc    There were no vitals filed for this visit.       Subjective Assessment - 10/29/16 1029    Subjective Back pain: 3/10 currently (pt sitting on a chair, back supported), 1/10 at best, 8/10 at worst (does not remember what brought it up that high, pt woke up on her L side).   R LE: 3/10 currently (pt sitting with her R leg crossed over her L)   Pertinent History R lumbar radiculopathy. Pt states feeling pain R posterior hip and R lateral and inferior lateral knee. On bad days, pt feels ache in bilateral LE. Also still feels pain in her low back.  Since the surgery in 2016, pt fell and bruised a rib which did not seem to affect her back. Also picked up things that she should  not have such as a large heavy cast iron pan last summer (2017) which kind of started something in her low back. Also has grandchildren in [place] and driving and riding there bothers her. Tries to get out of the car every hour. Also still does yoga which helps.   Takes gabapentin which makes her dizzy and affects her balance. Pt states that she feels like it helps but not totally.  No falls within the last 6 months.  States having diarrhea fairly frequently and sometimes comes out without knowing about it. Kind of has an idea which days it might occur.  Always had stomach problems since being very little but getting worse. Her PCP knows about it.  Denies saddle anesthesia.   Pt also states having L shoulder problems periodically within the last 2 months.     Patient Stated Goals To be able to walk and stand longer.    Currently in Pain? Yes   Pain Score 3    Pain Location Back  and bilateral LE R > L   Pain Orientation Posterior;Right;Left   Pain Descriptors / Indicators Aching;Stabbing;Numbness;Pins and needles   Pain Type Chronic pain   Pain Radiating Towards bilateral LE R > L   Pain Onset More than a month ago   Pain  Frequency Intermittent   Aggravating Factors  standing 20 min, sitting on a hard surface, sitting for prolonged periods such as in a car ride. Shopping such as for groceries (45 minutes max of walking, holding onto a shopping cart)   Pain Relieving Factors lumbar support when sitting, pushing her back against a support, sitting with one leg tucked under the other, heat, biofreeze, tylenol. Propping her feet up.             [facility] PT Assessment - 10/29/16 1013      Assessment   Medical Diagnosis R lumbar radiculopathy   Referring Provider Almedia Balls, MD   Onset Date/Surgical Date 09/22/16  Date PT referral signed. Chronic condition   Prior Therapy Pt had prior PT for back pain with good results     Precautions   Precaution Comments hx of lumbar fusion surgery      Restrictions   Other Position/Activity Restrictions no known weight bearing restrictions     Balance Screen   Has the patient fallen in the past 6 months No   Has the patient had a decrease in activity level because of a fear of falling?  No  pt states fear of falling   Is the patient reluctant to leave their home because of a fear of falling?  No  pt states fear of falling     Home Environment   Additional Comments Patient lives in a 2 story home with her husband. 4 steps to enter with bilateral rail. 20 steps inside with R rail      Prior Function   Level of Independence Independent   Vocation Requirements PLOF: better able to stand, sit, and ambulate for longer periods.      Observation/Other Assessments   Observations 6 minute walk test: 1054 ft   Modified [place] 46%     Posture/Postural Control   Posture Comments protracted neck, decreased lordosis     AROM   Lumbar Flexion WFL   Lumbar Extension limited   Lumbar - Right Side Bend slight R low back discomfort   Lumbar - Left Side Bend slight R low back discomfort   Lumbar - Right Rotation WFL   Lumbar - Left Rotation WFL     PROM   Overall PROM Comments R hip extension 10 degrees      Strength   Overall Strength Comments R low back/hip discomfort with R hip extension actively to end range in L S/L   Right Hip Flexion 4+/5   Right Hip ABduction 4-/5   Left Hip Flexion 4/5   Left Hip ABduction 4/5   Right Knee Flexion 4+/5   Right Knee Extension 5/5   Left Knee Flexion 4/5   Left Knee Extension 5/5     Ambulation/Gait   Gait Comments decreased stance L LE, independent, decreased trunk rotation. R lateral hip discomfort after walking about 230 ft.  Slight pelvic drop during contralateral stance phase.                            PT Education - 10/29/16 1129    Education provided Yes   Education Details ther-ex, HEP, plan of care   Person(s) Educated Patient   Methods  Explanation;Demonstration;Tactile cues;Verbal cues   Comprehension Returned demonstration;Verbalized understanding        Objectives  Per MRI report on 04/17/2016:    "Conus medullaris: Extends to the T12-L1 level and appears normal. 1. Interval L3-L5 fusion  without residual stenosis. 2. Mild left neural foraminal stenosis at L5-S1. Disc protrusion has decreased in size since 2015. 3. Chronic L2 compression fracture."    There-ex  Log rolling to the R, and to the L  Decreased pain with rolling to the side   Supine transversus abdominis contraction 10x5 seconds  Then with pelvic floor contraction 10x5 seconds    Given as part of her HEP to be performed throughout the day   Supine R hip extension isometrics, L knee in hooklying, R knee straight 10x5 seconds to promote hip extension strength   Pt was also recommended to inform her aquatherapist about her bowel issues. Pt states kind of knowing the days it occurs. Pt verbalized understanding.    Improved exercise technique, movement at target joints, use of target muscles after mod verbal, visual, tactile cues.          PT Long Term Goals - 10/29/16 1144      PT LONG TERM GOAL #1   Title Patient will improve her 6 minute walk distance to at least 1200 ft independent gait to promote mobility and ability to shop for groceries.    Baseline 6 minute walk: 1054 ft (10/29/2016)   Time 6   Period Weeks   Status New     PT LONG TERM GOAL #2   Title Patient will improve her Modified Oswestry Low Back Pain Disability Questionnaire score by at least 12% as a demonstration of improved function.    Baseline 46% (10/29/2016)   Time 6   Period Weeks   Status New     PT LONG TERM GOAL #3   Title Patient will improve bilateral hip abduction strength by at least 1/2 MMT grade to promote ability to perform functional tasks with less pain.    Baseline hip abduction: 4-/5 R, 4/5 L (10/29/2016)   Time 6   Period Weeks   Status New                Plan - 10/29/16 1130    Clinical Impression Statement Patient is a 76 year old female who came to physical therapy secondary to back pain with radiating symptoms bilateral LE R > L. She also presents with trunk and hip weakness, reproduction of R low back pain with R and L side bending, reproduction of R hip pain with end range hip extension, and difficulty performing functional tasks such as walking longer distances, and tolerating positions such as prolonged standing and sitting. Patient will benefit from skilled physical therapy services to address the aforementioned deficits.     Rehab Potential Good   Clinical Impairments Affecting Rehab Potential pain, chronicity of condition   PT Frequency 2x / week   PT Duration 6 weeks   PT Treatment/Interventions Aquatic Therapy;Electrical Stimulation;Iontophoresis 4mg /ml Dexamethasone;Therapeutic activities;Therapeutic exercise;Neuromuscular re-education;Patient/family education;Manual techniques;Dry needling   PT Next Visit Plan trunk muscle and hip strengthening, lumbopelvic control, scapular strengthening, aquatic therapy, modalities PRN   Consulted and Agree with Plan of Care Patient      Patient will benefit from skilled therapeutic intervention in order to improve the following deficits and impairments:  Pain, Difficulty walking, Decreased strength  Visit Diagnosis: Radiculopathy, lumbar region - Plan: PT plan of care cert/re-cert  Chronic bilateral low back pain, with sciatica presence unspecified - Plan: PT plan of care cert/re-cert  Muscle weakness (generalized) - Plan: PT plan of care cert/re-cert      G-Codes - 19/14/78 1150    Functional Assessment Tool Used (Outpatient  Only) Modified Oswestry Low Back Pain Disability Questionnaire, 6 minute walk test, clinical presentation, patient interview.    Functional Limitation Mobility: Walking and moving around   Mobility: Walking and Moving Around Current Status [phone])  At least 40 percent but less than 60 percent impaired, limited or restricted   Mobility: Walking and Moving Around Goal Status [phone]) At least 20 percent but less than 40 percent impaired, limited or restricted       Problem List Patient Active Problem List   Diagnosis Date Noted  . Cholecystitis with cholelithiasis 02/27/2016  . Increased frequency of urination 01/27/2015  . History of night sweats 05/01/2014  . Dupuytren's contracture 07/18/2013  . Inverted nipple 04/18/2013  . Depression 04/23/2010  . Fibromyalgia 04/23/2010  . Hypercholesterolemia 04/23/2010  . Hypertension, benign 04/23/2010  . Hypothyroidism 04/23/2010  . Irritable colon 04/23/2010  . Osteoarthritis 04/23/2010   Joneen Boers PT, DPT   10/29/2016, 12:19 PM  [facility] [address], [place], [address] Phone: [phone]   Fax:  [phone]  Name: Kendra Benton MRN: [id number] Date of Birth: [birth year]

## 2016-10-29 NOTE — Patient Instructions (Signed)
Gave transversus abdominis and pelvic floor contractions 5 seconds at least 10x3 to be performed daily in any position (such as supine, sitting, standing) as part of her HEP. Pt demonstrated and verbalized understanding.   Pt was also recommended to sit with a lumbar support such as a towel roll for comfort in her back. Pt verbalized understanding.

## 2016-11-06 ENCOUNTER — Encounter: Payer: Self-pay | Admitting: Physical Therapy

## 2016-11-06 ENCOUNTER — Ambulatory Visit: Payer: Medicare Other | Admitting: Physical Therapy

## 2016-11-06 DIAGNOSIS — M5416 Radiculopathy, lumbar region: Secondary | ICD-10-CM

## 2016-11-06 DIAGNOSIS — M545 Low back pain: Secondary | ICD-10-CM

## 2016-11-06 DIAGNOSIS — M6281 Muscle weakness (generalized): Secondary | ICD-10-CM

## 2016-11-06 DIAGNOSIS — G8929 Other chronic pain: Secondary | ICD-10-CM

## 2016-11-06 NOTE — Therapy (Signed)
[place] [facility] [address], [place], [address] Phone: [phone]   Fax:  [phone]  Physical Therapy Treatment  Patient Details  Name: Kendra Benton MRN: [id number] Date of Birth: 1941/06/09 Referring Provider: Almedia Balls, MD  Encounter Date: 11/06/2016      PT End of Session - 11/06/16 1033    Visit Number 2   Number of Visits 13   Date for PT Re-Evaluation 12/11/16   Authorization Type 2   Authorization Time Period of 10 g-code   PT Start Time 1032   PT Stop Time 1116   PT Time Calculation (min) 44 min   Activity Tolerance Patient tolerated treatment well   Behavior During Therapy [facility] for tasks assessed/performed      Past Medical History:  Diagnosis Date  . Allergy   . Hypertension   . Myocardial infarction ([place])    1997  . Osteoporosis     Past Surgical History:  Procedure Laterality Date  . ABDOMINAL HYSTERECTOMY     patient has ovaries  . APPENDECTOMY    . CHOLECYSTECTOMY N/A 02/28/2016   Procedure: LAPAROSCOPIC CHOLECYSTECTOMY WITH INTRAOPERATIVE CHOLANGIOGRAM;  Surgeon: Dia Crawford III, MD;  Location: [facility] ORS;  Service: General;  Laterality: N/A;  . LUMBAR FUSION  11/09/2016   L3-L5  . SPINE SURGERY     Disc    There were no vitals filed for this visit.      Subjective Assessment - 11/06/16 1035    Subjective Pt reports she has been performing HEP at home without any questions or concerns.  She has been waking up during the night with pain around R knee at times.  When this happens she takes a pillow and places it between her knees which seems to help.  She is doing well this date.    Pertinent History R lumbar radiculopathy. Pt states feeling pain R posterior hip and R lateral and inferior lateral knee. On bad days, pt feels ache in bilateral LE. Also still feels pain in her low back.  Since the surgery in 2016, pt fell and bruised a rib which did not seem to affect her back.  Also picked up things that she should not have such as a large heavy cast iron pan last summer (2017) which kind of started something in her low back. Also has grandchildren in [place] and driving and riding there bothers her. Tries to get out of the car every hour. Also still does yoga which helps.   Takes gabapentin which makes her dizzy and affects her balance. Pt states that she feels like it helps but not totally.  No falls within the last 6 months.  States having diarrhea fairly frequently and sometimes comes out without knowing about it. Kind of has an idea which days it might occur.  Always had stomach problems since being very little but getting worse. Her PCP knows about it.  Denies saddle anesthesia.   Pt also states having L shoulder problems periodically within the last 2 months.     Patient Stated Goals To be able to walk and stand longer.    Currently in Pain? Yes   Pain Score 3    Pain Location Back   Pain Orientation Lower   Pain Descriptors / Indicators Throbbing   Pain Type Chronic pain   Pain Onset More than a month ago   Multiple Pain Sites No       TREATMENT   Therapeutic  Exercise:  Supine R hip E isometrics with L knee in hooklying and R knee in extension. 10x5 sec.   Hooklying Bil hip ER/Abd with BTB 2x15. Cues for glute activation throughout.   Hooklying marches with cues for core activation with tactile feedback and cues to stabilize lower back flat on mat table. 2x10 each LE.   Hoolying posterior pelvic tilts with core activation with 5 second holds x10   Standing paloff press with BTB 2x10   Standing scapular retractions with BTB 2x10 with demonstration and tactile cues for proper technique. On the second set cues provided to maintain cervical retraction during exercise.   Standing hip Abd with BUEs supported 2x10 each LE. GTB on second set with fatigued noted and reported.   SLS on airex pad for 2x1 minute each LE for improved glute endurance. Pt reports  fatigue in gluteal region (R>L)   Pt reports she would like to be able to go up and down steps with alternating pattern as she current performs step to pattern holding onto railing due to pain.   Stair analysis: Pt ascends 4 steps as she would with step to pattern and significant use of LUE pulling on railing with decreased push off RLE. Pain during push off through RLE. During descent the pt demonstrates Bil knee valgus.   Had pt ascend 4 steps with cues to step with confidence and to push weight through heel when stepping up. Slight improvement in pain but still present during R push off phase.    Hip hikes on 6" step x10 each LE. Cues for increased R hip IR when in R stance for improved glute recruitment.          PT Education - 11/06/16 1032    Education provided Yes   Education Details Exercise technique; instructed pt to place pillow between knees before falling asleep rather than waiting until pain begins.   Person(s) Educated Patient   Methods Explanation;Demonstration;Verbal cues   Comprehension Verbalized understanding;Returned demonstration;Verbal cues required;Need further instruction             PT Long Term Goals - 10/29/16 1144      PT LONG TERM GOAL #1   Title Patient will improve her 6 minute walk distance to at least 1200 ft independent gait to promote mobility and ability to shop for groceries.    Baseline 6 minute walk: 1054 ft (10/29/2016)   Time 6   Period Weeks   Status New     PT LONG TERM GOAL #2   Title Patient will improve her Modified Oswestry Low Back Pain Disability Questionnaire score by at least 12% as a demonstration of improved function.    Baseline 46% (10/29/2016)   Time 6   Period Weeks   Status New     PT LONG TERM GOAL #3   Title Patient will improve bilateral hip abduction strength by at least 1/2 MMT grade to promote ability to perform functional tasks with less pain.    Baseline hip abduction: 4-/5 R, 4/5 L (10/29/2016)   Time 6    Period Weeks   Status New               Plan - 11/06/16 1134    Clinical Impression Statement Pt has good body awareness with core activation and postural exercises.  She demonstrates fatigue with standing rows and cues provided for cervical retraction for improved alignment and mechanics throughout. Pt fatigues with SLS exercise BLE and will benefit from additional  glute endurance exercises.  Pt reports pain with ascending steps at home which improved slightly with cues for technique. She has poor recruitment of gluteal musculature with stepping activities and will benefit from continued intervention to address this impairment.  She remains motivated and will benefit from continued skilled PT interventions for improved QOL.   Rehab Potential Good   Clinical Impairments Affecting Rehab Potential pain, chronicity of condition   PT Frequency 2x / week   PT Duration 6 weeks   PT Treatment/Interventions Aquatic Therapy;Electrical Stimulation;Iontophoresis 4mg /ml Dexamethasone;Therapeutic activities;Therapeutic exercise;Neuromuscular re-education;Patient/family education;Manual techniques;Dry needling   PT Next Visit Plan trunk muscle and hip strengthening, lumbopelvic control, scapular strengthening, aquatic therapy, modalities PRN   Consulted and Agree with Plan of Care Patient      Patient will benefit from skilled therapeutic intervention in order to improve the following deficits and impairments:  Pain, Difficulty walking, Decreased strength  Visit Diagnosis: Radiculopathy, lumbar region  Chronic bilateral low back pain, with sciatica presence unspecified  Muscle weakness (generalized)     Problem List Patient Active Problem List   Diagnosis Date Noted  . Cholecystitis with cholelithiasis 02/27/2016  . Increased frequency of urination 01/27/2015  . History of night sweats 05/01/2014  . Dupuytren's contracture 07/18/2013  . Inverted nipple 04/18/2013  . Depression  04/23/2010  . Fibromyalgia 04/23/2010  . Hypercholesterolemia 04/23/2010  . Hypertension, benign 04/23/2010  . Hypothyroidism 04/23/2010  . Irritable colon 04/23/2010  . Osteoarthritis 04/23/2010    Collie Siad PT, DPT 11/06/2016, 11:42 AM  [place] [facility] [address], [place], [address] Phone: [phone]   Fax:  [phone]  Name: Kendra Benton MRN: [id number] Date of Birth: 13-Nov-1940

## 2016-11-09 HISTORY — PX: LUMBAR FUSION: SHX111

## 2016-11-11 ENCOUNTER — Ambulatory Visit: Payer: Medicare Other | Attending: Orthopedic Surgery

## 2016-11-11 DIAGNOSIS — G8929 Other chronic pain: Secondary | ICD-10-CM | POA: Diagnosis present

## 2016-11-11 DIAGNOSIS — M5416 Radiculopathy, lumbar region: Secondary | ICD-10-CM | POA: Insufficient documentation

## 2016-11-11 DIAGNOSIS — M6281 Muscle weakness (generalized): Secondary | ICD-10-CM | POA: Diagnosis present

## 2016-11-11 DIAGNOSIS — M545 Low back pain: Secondary | ICD-10-CM | POA: Insufficient documentation

## 2016-11-11 DIAGNOSIS — Z981 Arthrodesis status: Secondary | ICD-10-CM | POA: Diagnosis present

## 2016-11-11 NOTE — Therapy (Signed)
[place] [facility] [address], [place], [address] Phone: [phone]   Fax:  [phone]  Physical Therapy Treatment  Patient Details  Name: Kendra Benton MRN: [id number] Date of Birth: January 23, 1941 Referring Provider: Almedia Balls, MD  Encounter Date: 11/11/2016      PT End of Session - 11/11/16 0901    Visit Number 3   Number of Visits 13   Date for PT Re-Evaluation 12/11/16   Authorization Type 3   Authorization Time Period of 10 g-code   PT Start Time 0901   PT Stop Time 0943   PT Time Calculation (min) 42 min   Activity Tolerance Patient tolerated treatment well   Behavior During Therapy [facility] for tasks assessed/performed      Past Medical History:  Diagnosis Date  . Allergy   . Hypertension   . Myocardial infarction ([place])    1997  . Osteoporosis     Past Surgical History:  Procedure Laterality Date  . ABDOMINAL HYSTERECTOMY     patient has ovaries  . APPENDECTOMY    . CHOLECYSTECTOMY N/A 02/28/2016   Procedure: LAPAROSCOPIC CHOLECYSTECTOMY WITH INTRAOPERATIVE CHOLANGIOGRAM;  Surgeon: Dia Crawford III, MD;  Location: [facility] ORS;  Service: General;  Laterality: N/A;  . LUMBAR FUSION  11/09/2016   L3-L5  . SPINE SURGERY     Disc    There were no vitals filed for this visit.      Subjective Assessment - 11/11/16 0904    Subjective The low back and R LE is better. Woke her up during the night which it frequently does. Putting a pillow under her hips or a pillow between her knees helps.  The PT and yoga seems to help.    Pertinent History R lumbar radiculopathy. Pt states feeling pain R posterior hip and R lateral and inferior lateral knee. On bad days, pt feels ache in bilateral LE. Also still feels pain in her low back.  Since the surgery in 2016, pt fell and bruised a rib which did not seem to affect her back. Also picked up things that she should not have such as a large heavy cast iron pan last  summer (2017) which kind of started something in her low back. Also has grandchildren in [place] and driving and riding there bothers her. Tries to get out of the car every hour. Also still does yoga which helps.   Takes gabapentin which makes her dizzy and affects her balance. Pt states that she feels like it helps but not totally.  No falls within the last 6 months.  States having diarrhea fairly frequently and sometimes comes out without knowing about it. Kind of has an idea which days it might occur.  Always had stomach problems since being very little but getting worse. Her PCP knows about it.  Denies saddle anesthesia.   Pt also states having L shoulder problems periodically within the last 2 months.     Patient Stated Goals To be able to walk and stand longer.    Currently in Pain? Yes   Pain Score 1    Pain Onset More than a month ago                                 PT Education - 11/11/16 0905    Education provided Yes   Education Details ther-ex, HEP   Person(s) Educated Patient  Methods Explanation;Demonstration;Tactile cues;Verbal cues   Comprehension Returned demonstration;Verbalized understanding       Objectives   TREATMENT   Therapeutic Exercise:  Supine R hip extension isometrics with L knee in hooklying and R knee in extension. 10x5 seconds for 2 sets  Hooklying Bil hip ER/Abd with BTB 2x15. Cues for glute activation throughout.    Hoolying posterior pelvic tilts with core activation with 5 second holds 2x10   Hooklying marches with cues for core activation 2x10 each LE.    Standing leg press resisting blue band R LE with bilateral UE assist, cues for glute max activation 10x and control with eccentric return.   Forward step up onto 3 inch step halfway with L tip toe assist and L UE assist, emphasis on glute max muscle use and R LE femoral control   Standing scapular retractions with BTB 2x10 with chin tucks  Hip hikes on 3" step  x10 each LE to promote glute med muscle use   Standing hip Abd with BUEs supported 2x10 each LE. GTB on second set. Hip soreness afterwards.  Red T-band low rows 10x2 to promote abdominal muscle use   Improved exercise technique, movement at target joints, use of target muscles after min to mod verbal, visual, tactile cues.   Continued working on glute muscle use and core activation to promote hip extension ROM and decrease low back extension compensation to help decrease pain. Also worked on strengthening to help with stair negotiation.                PT Long Term Goals - 10/29/16 1144      PT LONG TERM GOAL #1   Title Patient will improve her 6 minute walk distance to at least 1200 ft independent gait to promote mobility and ability to shop for groceries.    Baseline 6 minute walk: 1054 ft (10/29/2016)   Time 6   Period Weeks   Status New     PT LONG TERM GOAL #2   Title Patient will improve her Modified Oswestry Low Back Pain Disability Questionnaire score by at least 12% as a demonstration of improved function.    Baseline 46% (10/29/2016)   Time 6   Period Weeks   Status New     PT LONG TERM GOAL #3   Title Patient will improve bilateral hip abduction strength by at least 1/2 MMT grade to promote ability to perform functional tasks with less pain.    Baseline hip abduction: 4-/5 R, 4/5 L (10/29/2016)   Time 6   Period Weeks   Status New               Plan - 11/11/16 5361    Clinical Impression Statement Continued working on glute muscle use and core activation to promote hip extension ROM and decrease low back extension compensation to help decrease pain. Also worked on strengthening to help with stair negotiation.    Rehab Potential Good   Clinical Impairments Affecting Rehab Potential pain, chronicity of condition   PT Frequency 2x / week   PT Duration 6 weeks   PT Treatment/Interventions Aquatic Therapy;Electrical Stimulation;Iontophoresis 4mg /ml  Dexamethasone;Therapeutic activities;Therapeutic exercise;Neuromuscular re-education;Patient/family education;Manual techniques;Dry needling   PT Next Visit Plan trunk muscle and hip strengthening, lumbopelvic control, scapular strengthening, aquatic therapy, modalities PRN   Consulted and Agree with Plan of Care Patient      Patient will benefit from skilled therapeutic intervention in order to improve the following deficits and impairments:  Pain, Difficulty  walking, Decreased strength  Visit Diagnosis: Radiculopathy, lumbar region  Chronic bilateral low back pain, with sciatica presence unspecified  Muscle weakness (generalized)     Problem List Patient Active Problem List   Diagnosis Date Noted  . Cholecystitis with cholelithiasis 02/27/2016  . Increased frequency of urination 01/27/2015  . History of night sweats 05/01/2014  . Dupuytren's contracture 07/18/2013  . Inverted nipple 04/18/2013  . Depression 04/23/2010  . Fibromyalgia 04/23/2010  . Hypercholesterolemia 04/23/2010  . Hypertension, benign 04/23/2010  . Hypothyroidism 04/23/2010  . Irritable colon 04/23/2010  . Osteoarthritis 04/23/2010    Joneen Boers PT, DPT   11/11/2016, 11:54 AM  [place] [facility] [address], [place], [address] Phone: [phone]   Fax:  [phone]  Name: MARTIKA EGLER MRN: [id number] Date of Birth: 09/13/1940

## 2016-11-11 NOTE — Patient Instructions (Addendum)
   Gave supine clamshell exercise with glute max squeeze resisting blue band 15x2 daily as part of her HEP. Pt demonstrated and verbalized understanding. Blue band provided.   Gave supine marches 10x2 each LE with transversus abdominis and pelvic floor muscle activation as part of her HEP. Pt demonstrated and verbalized understanding.

## 2016-11-13 ENCOUNTER — Ambulatory Visit: Payer: Medicare Other | Admitting: Physical Therapy

## 2016-11-18 ENCOUNTER — Ambulatory Visit: Payer: Medicare Other

## 2016-11-18 DIAGNOSIS — M5416 Radiculopathy, lumbar region: Secondary | ICD-10-CM

## 2016-11-18 DIAGNOSIS — M6281 Muscle weakness (generalized): Secondary | ICD-10-CM

## 2016-11-18 DIAGNOSIS — G8929 Other chronic pain: Secondary | ICD-10-CM

## 2016-11-18 DIAGNOSIS — M545 Low back pain: Secondary | ICD-10-CM
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
## 2016-11-20 ENCOUNTER — Ambulatory Visit: Payer: Medicare Other | Admitting: Physical Therapy

## 2016-11-20 DIAGNOSIS — M5416 Radiculopathy, lumbar region: Secondary | ICD-10-CM

## 2016-11-20 DIAGNOSIS — G8929 Other chronic pain: Secondary | ICD-10-CM

## 2016-11-20 DIAGNOSIS — M6281 Muscle weakness (generalized): Secondary | ICD-10-CM

## 2016-11-20 DIAGNOSIS — Z981 Arthrodesis status: Secondary | ICD-10-CM

## 2016-11-20 DIAGNOSIS — M545 Low back pain: Secondary | ICD-10-CM

## 2016-11-20 NOTE — Therapy (Signed)
[place] MAIN [facility] SERVICES [place], [place], [address] Phone: [phone]   Fax:  [phone]  Physical Therapy Treatment  Patient Details  Name: Kendra Benton MRN: [id number] Date of Birth: [birth year] Referring Provider: Almedia Balls, MD  Encounter Date: 11/20/2016    Past Medical History:  Diagnosis Date  . Allergy   . Hypertension   . Myocardial infarction ([place])    1997  . Osteoporosis     Past Surgical History:  Procedure Laterality Date  . ABDOMINAL HYSTERECTOMY     patient has ovaries  . APPENDECTOMY    . CHOLECYSTECTOMY N/A 02/28/2016   Procedure: LAPAROSCOPIC CHOLECYSTECTOMY WITH INTRAOPERATIVE CHOLANGIOGRAM;  Surgeon: Dia Crawford III, MD;  Location: [facility] ORS;  Service: General;  Laterality: N/A;  . LUMBAR FUSION  11/09/2016   L3-L5  . SPINE SURGERY     Disc    There were no vitals filed for this visit.      Subjective Assessment - 11/20/16 1013    Subjective Pt reported she has been on pain medication that makes her dizzy. Pt reported the PT exercises she has been practicing on land has been helpful   Pertinent History R lumbar radiculopathy. Pt states feeling pain R posterior hip and R lateral and inferior lateral knee. On bad days, pt feels ache in bilateral LE. Also still feels pain in her low back.  Since the surgery in 2016, pt fell and bruised a rib which did not seem to affect her back. Also picked up things that she should not have such as a large heavy cast iron pan last summer (2017) which kind of started something in her low back. Also has grandchildren in [place] and driving and riding there bothers her. Tries to get out of the car every hour. Also still does yoga which helps.   Takes gabapentin which makes her dizzy and affects her balance. Pt states that she feels like it helps but not totally.  No falls within the last 6 months.  States having diarrhea fairly frequently and sometimes comes out  without knowing about it. Kind of has an idea which days it might occur.  Always had stomach problems since being very little but getting worse. Her PCP knows about it.  Denies saddle anesthesia.   Pt also states having L shoulder problems periodically within the last 2 months.     Patient Stated Goals To be able to walk and stand longer.    Pain Onset More than a month ago                     Adult Aquatic Therapy - 11/20/16 1014      Aquatic Therapy Subjective   Subjective reports no pain during exercises       O: Pt entered/exited the pool via steps with single UE support on rail.  50 ft =1 lap  Exercises performed in 3'6" depth   Stretches: Traction sidebend L and R with traction by wall   Mini squat-figure 4 (piriformis) hip flexor stretch on step hip flexor twist on step  2 laps sidestep +cuing for alignment to minimize ankle discomfort, wider BOS required  2 laps sidestep + squat with cuing for alignment and deep core mm  L/R , educated application with ADLs (bending, pulling low drawers)    Seated on noodle, noodle under armpits Back ward walking x 2 laps  Seated breast stroke 30 sec x 3 sets  Standing marching wiith cue for high thighs 30 sec x 3 sets with rest     Stretches: Traction sidebend L and R with traction by wall   Mini squat-figure 4 (piriformis) hip flexor stretch on step hip flexor twist on step  rest break 2'                PT Long Term Goals - 10/29/16 1144      PT LONG TERM GOAL #1   Title Patient will improve her 6 minute walk distance to at least 1200 ft independent gait to promote mobility and ability to shop for groceries.    Baseline 6 minute walk: 1054 ft (10/29/2016)   Time 6   Period Weeks   Status New     PT LONG TERM GOAL #2   Title Patient will improve her Modified Oswestry Low Back Pain Disability Questionnaire score by at least 12% as a demonstration of improved function.    Baseline 46%  (10/29/2016)   Time 6   Period Weeks   Status New     PT LONG TERM GOAL #3   Title Patient will improve bilateral hip abduction strength by at least 1/2 MMT grade to promote ability to perform functional tasks with less pain.    Baseline hip abduction: 4-/5 R, 4/5 L (10/29/2016)   Time 6   Period Weeks   Status New             Patient will benefit from skilled therapeutic intervention in order to improve the following deficits and impairments:     Visit Diagnosis: Radiculopathy, lumbar region  Chronic bilateral low back pain, with sciatica presence unspecified  Muscle weakness (generalized)  S/P lumbar fusion     Problem List Patient Active Problem List   Diagnosis Date Noted  . Cholecystitis with cholelithiasis 02/27/2016  . Increased frequency of urination 01/27/2015  . History of night sweats 05/01/2014  . Dupuytren's contracture 07/18/2013  . Inverted nipple 04/18/2013  . Depression 04/23/2010  . Fibromyalgia 04/23/2010  . Hypercholesterolemia 04/23/2010  . Hypertension, benign 04/23/2010  . Hypothyroidism 04/23/2010  . Irritable colon 04/23/2010  . Osteoarthritis 04/23/2010    Jerl Mina ,PT, DPT, E-RYT  11/20/2016, 10:20 AM  [place] MAIN [facility] SERVICES [address] [place], [place], [address] Phone: [phone]   Fax:  [phone]  Name: Kendra Benton MRN: [id number] Date of Birth: [birth year]

## 2016-11-25 ENCOUNTER — Ambulatory Visit: Payer: Medicare Other

## 2016-11-25 DIAGNOSIS — M5416 Radiculopathy, lumbar region: Secondary | ICD-10-CM | POA: Diagnosis not present

## 2016-11-25 DIAGNOSIS — M6281 Muscle weakness (generalized): Secondary | ICD-10-CM

## 2016-11-25 DIAGNOSIS — G8929 Other chronic pain: Secondary | ICD-10-CM

## 2016-11-25 DIAGNOSIS — M545 Low back pain: Secondary | ICD-10-CM

## 2016-11-25 NOTE — Patient Instructions (Signed)
Gave SLS on R LE with bilateral UE assist 10 seconds x 5 for 3x daily to work R glute med muscles as part of her HEP. Handout provided. Pt demonstrated and verbalized understanding.

## 2016-11-25 NOTE — Therapy (Signed)
[place] [facility] [address], [place], [address] Phone: [phone]   Fax:  [phone]  Physical Therapy Treatment  Patient Details  Name: Kendra Benton MRN: [id number] Date of Birth: 11/12/1940 Referring Provider: Almedia Balls, MD  Encounter Date: 11/25/2016      PT End of Session - 11/25/16 1635    Visit Number 5   Number of Visits 13   Date for PT Re-Evaluation 12/11/16   Authorization Type 5   Authorization Time Period of 10 g-code   PT Start Time 1636   PT Stop Time 1725   PT Time Calculation (min) 49 min   Activity Tolerance Patient tolerated treatment well   Behavior During Therapy [facility] for tasks assessed/performed      Past Medical History:  Diagnosis Date  . Allergy   . Hypertension   . Myocardial infarction ([place])    1997  . Osteoporosis     Past Surgical History:  Procedure Laterality Date  . ABDOMINAL HYSTERECTOMY     patient has ovaries  . APPENDECTOMY    . CHOLECYSTECTOMY N/A 02/28/2016   Procedure: LAPAROSCOPIC CHOLECYSTECTOMY WITH INTRAOPERATIVE CHOLANGIOGRAM;  Surgeon: Dia Crawford III, MD;  Location: [facility] ORS;  Service: General;  Laterality: N/A;  . LUMBAR FUSION  11/09/2016   L3-L5  . SPINE SURGERY     Disc    There were no vitals filed for this visit.      Subjective Assessment - 11/25/16 1637    Subjective Back and R LE is better. Did yoga this morning and it was real good. It kind of stretched things out.  Having pain and tightness in her low back bilaterally.  1/10 low back pain currently. No R LE symptoms today but had some yesterday (4/10 posterior lateral hip, thigh and knee; L 5 dermatome).  The pool was good. Was sore the next day.  Pt states seeing her orthopedist last week who said that whatever we are doing in therapy, we are doing it right.    Pertinent History R lumbar radiculopathy. Pt states feeling pain R posterior hip and R lateral and inferior lateral knee. On  bad days, pt feels ache in bilateral LE. Also still feels pain in her low back.  Since the surgery in 2016, pt fell and bruised a rib which did not seem to affect her back. Also picked up things that she should not have such as a large heavy cast iron pan last summer (2017) which kind of started something in her low back. Also has grandchildren in [place] and driving and riding there bothers her. Tries to get out of the car every hour. Also still does yoga which helps.   Takes gabapentin which makes her dizzy and affects her balance. Pt states that she feels like it helps but not totally.  No falls within the last 6 months.  States having diarrhea fairly frequently and sometimes comes out without knowing about it. Kind of has an idea which days it might occur.  Always had stomach problems since being very little but getting worse. Her PCP knows about it.  Denies saddle anesthesia.   Pt also states having L shoulder problems periodically within the last 2 months.     Patient Stated Goals To be able to walk and stand longer.    Currently in Pain? Yes   Pain Score 1    Pain Onset More than a month ago  PT Education - 11/25/16 1712    Education provided Yes   Education Details ther-ex, HEP   Person(s) Educated Patient   Methods Explanation;Demonstration;Tactile cues;Verbal cues;Handout   Comprehension Returned demonstration;Verbalized understanding        Objectives  slight L lateral lean posture in standing    TREATMENT   Therapeutic Exercise:   Standing R shoulder adduction 10x2 to help decrease L lateral lean Standing low rows 4x5 seconds   Then no holds 10x   Slight R low back discomfort which eases with rest.   Forward step up onto 1st regular step with bilateral UE assist 10x  R low back discomfort at repetition 9 and 10  L pelvic drop with R low back side bending when stepping up with R LE.   SLS on R LE with bilateral  UE assist 5x2 with 10 second holds  Good R glute muscle use felt  Reviewed and given as part of her HEP. Pt demonstrated and verbalized understanding.  5x10 seconds 3x daily. Handout provided.    Seated bilateral scapular retraction resisting blue band 10x  Then 10x 5 seconds to promote scapular retraction  Standing static mini lunge on R LE with L UE 7x. Slight R low back discomfort. Eases with rest  Hoolying posterior pelvic tilts with core activation with 10 second holds 2x5   Supine transversus abdominis contraction with pelvic floor contraction 7x5 seconds.  R low back discomfort around 7th repetition which eases with rest   Supine R hip extension isometrics with L knee in hooklying and R leg straight 6x 5 seconds. R low back discomfort which eases quickly with rest.   Seated L hip extension isometrics 10x2 with 5 second holds. No L low back pain, 1/10 R low back pain afterwards. Seated R hip extension isometrics 10x5 seconds. No R low back pain.     Improved exercise technique, movement at target joints, use of target muscles after mod verbal, visual, tactile cues.   Slight sharp R low back pain at times during exercises which quickly disappears with rest.  No R or L low back pain after session after performing seated hip extension isometric exercises activating glute max muscles (R and L)  in a comfortable position to her low back. Pt demonstrates L pelvic drop with R low back side bend compensation when stepping up with her R LE onto a step. Added SLS on R LE with bilateral UE assist and emphasis on level pelvis to promote R glute med strengthening and decrease compensation to help address.              PT Long Term Goals - 10/29/16 1144      PT LONG TERM GOAL #1   Title Patient will improve her 6 minute walk distance to at least 1200 ft independent gait to promote mobility and ability to shop for groceries.    Baseline 6 minute walk: 1054 ft (10/29/2016)   Time 6    Period Weeks   Status New     PT LONG TERM GOAL #2   Title Patient will improve her Modified Oswestry Low Back Pain Disability Questionnaire score by at least 12% as a demonstration of improved function.    Baseline 46% (10/29/2016)   Time 6   Period Weeks   Status New     PT LONG TERM GOAL #3   Title Patient will improve bilateral hip abduction strength by at least 1/2 MMT grade to promote ability to perform functional tasks  with less pain.    Baseline hip abduction: 4-/5 R, 4/5 L (10/29/2016)   Time 6   Period Weeks   Status New               Plan - 11/25/16 1635    Clinical Impression Statement Slight sharp R low back pain at times during exercises which quickly disappears with rest.  No R or L low back pain after session after performing seated hip extension isometric exercises activating glute max muscles (R and L)  in a comfortable position to her low back. Pt demonstrates L pelvic drop with R low back side bend compensation when stepping up with her R LE onto a step. Added SLS on R LE with bilateral UE assist and emphasis on level pelvis to promote R glute med strengthening and decrease compensation to help address.    History and Personal Factors relevant to plan of care: please see eval   Clinical Presentation Stable   Clinical Presentation due to: improving R low back and R LE symptoms   Clinical Decision Making Low   Rehab Potential Good   Clinical Impairments Affecting Rehab Potential pain, chronicity of condition   PT Frequency 2x / week   PT Duration 6 weeks   PT Treatment/Interventions Aquatic Therapy;Electrical Stimulation;Iontophoresis 4mg /ml Dexamethasone;Therapeutic activities;Therapeutic exercise;Neuromuscular re-education;Patient/family education;Manual techniques;Dry needling   PT Next Visit Plan trunk muscle and hip strengthening, lumbopelvic control, scapular strengthening, aquatic therapy, modalities PRN   Consulted and Agree with Plan of Care Patient       Patient will benefit from skilled therapeutic intervention in order to improve the following deficits and impairments:  Pain, Difficulty walking, Decreased strength  Visit Diagnosis: Radiculopathy, lumbar region  Chronic bilateral low back pain, with sciatica presence unspecified  Muscle weakness (generalized)     Problem List Patient Active Problem List   Diagnosis Date Noted  . Cholecystitis with cholelithiasis 02/27/2016  . Increased frequency of urination 01/27/2015  . History of night sweats 05/01/2014  . Dupuytren's contracture 07/18/2013  . Inverted nipple 04/18/2013  . Depression 04/23/2010  . Fibromyalgia 04/23/2010  . Hypercholesterolemia 04/23/2010  . Hypertension, benign 04/23/2010  . Hypothyroidism 04/23/2010  . Irritable colon 04/23/2010  . Osteoarthritis 04/23/2010   Joneen Boers PT, DPT   11/25/2016, 5:43 PM  [place] [facility] [address] [place], [address] Phone: [phone]   Fax:  [phone]  Name: Kendra Benton MRN: [id number] Date of Birth: 1940-10-23

## 2016-11-27 ENCOUNTER — Ambulatory Visit: Payer: Medicare Other

## 2016-11-27 DIAGNOSIS — M545 Low back pain: Secondary | ICD-10-CM

## 2016-11-27 DIAGNOSIS — G8929 Other chronic pain: Secondary | ICD-10-CM

## 2016-11-27 DIAGNOSIS — M5416 Radiculopathy, lumbar region: Secondary | ICD-10-CM | POA: Diagnosis not present

## 2016-11-27 DIAGNOSIS — M6281 Muscle weakness (generalized): Secondary | ICD-10-CM

## 2016-11-27 NOTE — Therapy (Signed)
[place] [facility] [address] [place], [address] Phone: [phone]   Fax:  [phone]  Physical Therapy Treatment  Patient Details  Name: Kendra Benton MRN: [id number] Date of Birth: 1940/11/29 Referring Provider: Almedia Balls, MD  Encounter Date: 11/27/2016      PT End of Session - 11/27/16 0905    Visit Number 6   Number of Visits 13   Date for PT Re-Evaluation 12/11/16   Authorization Type 6   Authorization Time Period of 10 g-code   PT Start Time 0905   PT Stop Time 0935   PT Time Calculation (min) 30 min   Activity Tolerance Patient tolerated treatment well   Behavior During Therapy [facility] for tasks assessed/performed      Past Medical History:  Diagnosis Date  . Allergy   . Hypertension   . Myocardial infarction ([place])    1997  . Osteoporosis     Past Surgical History:  Procedure Laterality Date  . ABDOMINAL HYSTERECTOMY     patient has ovaries  . APPENDECTOMY    . CHOLECYSTECTOMY N/A 02/28/2016   Procedure: LAPAROSCOPIC CHOLECYSTECTOMY WITH INTRAOPERATIVE CHOLANGIOGRAM;  Surgeon: Dia Crawford III, MD;  Location: [facility] ORS;  Service: General;  Laterality: N/A;  . LUMBAR FUSION  11/09/2016   L3-L5  . SPINE SURGERY     Disc    There were no vitals filed for this visit.      Subjective Assessment - 11/27/16 0907    Subjective Wants to do a shorter session today because she is not feeling well. The [facility] went out yesterday and had a hard time sleeping.  Feels very tired and weak and shaky. Ate breakfast (a piece of toast with cream cheese on it).  Heat does her very badly and makes her legs ache and feel tingly. Not much pain right now.  Did one set of seated R hip extension exercise this morning.  1/10 R low back currently.    Pertinent History R lumbar radiculopathy. Pt states feeling pain R posterior hip and R lateral and inferior lateral knee. On bad days, pt feels ache in bilateral LE. Also still  feels pain in her low back.  Since the surgery in 2016, pt fell and bruised a rib which did not seem to affect her back. Also picked up things that she should not have such as a large heavy cast iron pan last summer (2017) which kind of started something in her low back. Also has grandchildren in [place] and driving and riding there bothers her. Tries to get out of the car every hour. Also still does yoga which helps.   Takes gabapentin which makes her dizzy and affects her balance. Pt states that she feels like it helps but not totally.  No falls within the last 6 months.  States having diarrhea fairly frequently and sometimes comes out without knowing about it. Kind of has an idea which days it might occur.  Always had stomach problems since being very little but getting worse. Her PCP knows about it.  Denies saddle anesthesia.   Pt also states having L shoulder problems periodically within the last 2 months.     Patient Stated Goals To be able to walk and stand longer.    Currently in Pain? Yes   Pain Score 1    Pain Onset More than a month ago  PT Education - 11/27/16 0911    Education provided Yes   Education Details ther-ex   Person(s) Educated Patient   Methods Explanation;Demonstration;Tactile cues;Verbal cues   Comprehension Returned demonstration;Verbalized understanding        Objectives    TREATMENT   Therapeutic Exercise:   Vitals obtained  Blood pressure, L arm sitting, mechanically taken: 144/65, HR 69   Seated hip adductor glute max squeeze with ball 10x5 seconds for 2 sets  Seated R hip extension resisting green band 10x2   No R low back pain after aforementioned exercises.   Side stepping 32 ft to the R and 32 ft to the L for 2 sets  Forward step up onto Air Ex Pad with one UE assist 10x2 each LE   Slight R low back pain afterwards.   Seated glute max squeeze 10 seconds x 5  No pain  afterwards  Improved exercise technique, movement at target joints, use of target muscles after min to mod verbal, visual, tactile cues.      Decreased R low back pain with R glute max muscle use in sitting. Slight increase in symptoms with standing exercises. Light session performed secondary to pt request. No pain after session.          PT Long Term Goals - 10/29/16 1144      PT LONG TERM GOAL #1   Title Patient will improve her 6 minute walk distance to at least 1200 ft independent gait to promote mobility and ability to shop for groceries.    Baseline 6 minute walk: 1054 ft (10/29/2016)   Time 6   Period Weeks   Status New     PT LONG TERM GOAL #2   Title Patient will improve her Modified Oswestry Low Back Pain Disability Questionnaire score by at least 12% as a demonstration of improved function.    Baseline 46% (10/29/2016)   Time 6   Period Weeks   Status New     PT LONG TERM GOAL #3   Title Patient will improve bilateral hip abduction strength by at least 1/2 MMT grade to promote ability to perform functional tasks with less pain.    Baseline hip abduction: 4-/5 R, 4/5 L (10/29/2016)   Time 6   Period Weeks   Status New               Plan - 11/27/16 0912    Clinical Impression Statement Decreased R low back pain with R glute max muscle use in sitting. Slight increase in symptoms with standing exercises. Light session performed secondary to pt request. No pain after session.    Clinical Presentation Stable   Clinical Decision Making Low   Rehab Potential Good   Clinical Impairments Affecting Rehab Potential pain, chronicity of condition   PT Frequency 2x / week   PT Duration 6 weeks   PT Treatment/Interventions Aquatic Therapy;Electrical Stimulation;Iontophoresis 4mg /ml Dexamethasone;Therapeutic activities;Therapeutic exercise;Neuromuscular re-education;Patient/family education;Manual techniques;Dry needling   PT Next Visit Plan trunk muscle and hip  strengthening, lumbopelvic control, scapular strengthening, aquatic therapy, modalities PRN   Consulted and Agree with Plan of Care Patient      Patient will benefit from skilled therapeutic intervention in order to improve the following deficits and impairments:  Pain, Difficulty walking, Decreased strength  Visit Diagnosis: Radiculopathy, lumbar region  Chronic bilateral low back pain, with sciatica presence unspecified  Muscle weakness (generalized)     Problem List Patient Active Problem List   Diagnosis Date Noted  .  Cholecystitis with cholelithiasis 02/27/2016  . Increased frequency of urination 01/27/2015  . History of night sweats 05/01/2014  . Dupuytren's contracture 07/18/2013  . Inverted nipple 04/18/2013  . Depression 04/23/2010  . Fibromyalgia 04/23/2010  . Hypercholesterolemia 04/23/2010  . Hypertension, benign 04/23/2010  . Hypothyroidism 04/23/2010  . Irritable colon 04/23/2010  . Osteoarthritis 04/23/2010    Joneen Boers PT, DPT   11/27/2016, 11:21 AM  [place] [facility] [address] [place], [address] Phone: [phone]   Fax:  [phone]  Name: Kendra Benton MRN: [id number] Date of Birth: 1940/12/28

## 2016-12-03 ENCOUNTER — Ambulatory Visit: Payer: Medicare Other

## 2016-12-03 DIAGNOSIS — M6281 Muscle weakness (generalized): Secondary | ICD-10-CM

## 2016-12-03 DIAGNOSIS — M5416 Radiculopathy, lumbar region: Secondary | ICD-10-CM

## 2016-12-03 DIAGNOSIS — G8929 Other chronic pain: Secondary | ICD-10-CM

## 2016-12-03 DIAGNOSIS — M545 Low back pain: Secondary | ICD-10-CM

## 2016-12-03 NOTE — Therapy (Signed)
[facility] [facility] [address], [place], [address] Phone: [phone]   Fax:  [phone]  Physical Therapy Treatment And Progress Report  Patient Details  Name: Kendra Benton MRN: [id number] Date of Birth: 05/04/1941 Referring Provider: Almedia Balls, MD  Encounter Date: 12/03/2016      PT End of Session - 12/03/16 0858    Visit Number 7   Number of Visits 19   Date for PT Re-Evaluation 01/01/17   Authorization Type 1   Authorization Time Period of 10 g-code   PT Start Time 0858   PT Stop Time 0954   PT Time Calculation (min) 56 min   Activity Tolerance Patient tolerated treatment well   Behavior During Therapy [facility] for tasks assessed/performed      Past Medical History:  Diagnosis Date  . Allergy   . Hypertension   . Myocardial infarction ([place])    1997  . Osteoporosis     Past Surgical History:  Procedure Laterality Date  . ABDOMINAL HYSTERECTOMY     patient has ovaries  . APPENDECTOMY    . CHOLECYSTECTOMY N/A 02/28/2016   Procedure: LAPAROSCOPIC CHOLECYSTECTOMY WITH INTRAOPERATIVE CHOLANGIOGRAM;  Surgeon: Dia Crawford III, MD;  Location: [facility] ORS;  Service: General;  Laterality: N/A;  . LUMBAR FUSION  11/09/2016   L3-L5  . SPINE SURGERY     Disc    There were no vitals filed for this visit.      Subjective Assessment - 12/03/16 0859    Subjective Back and R LE are doing better. Has been running around this morning (laundry, making her bed). 1/10 low back pain currently.  3/10 back and R LE pain at worst for the past 7 days.  Making herself go up and down steps. For the most part, its getting easier.  Going down steps is harder which causes discomfort at her R lateral hip.     Pertinent History R lumbar radiculopathy. Pt states feeling pain R posterior hip and R lateral and inferior lateral knee. On bad days, pt feels ache in bilateral LE. Also still feels pain in her low back.  Since the surgery  in 2016, pt fell and bruised a rib which did not seem to affect her back. Also picked up things that she should not have such as a large heavy cast iron pan last summer (2017) which kind of started something in her low back. Also has grandchildren in [place] and driving and riding there bothers her. Tries to get out of the car every hour. Also still does yoga which helps.   Takes gabapentin which makes her dizzy and affects her balance. Pt states that she feels like it helps but not totally.  No falls within the last 6 months.  States having diarrhea fairly frequently and sometimes comes out without knowing about it. Kind of has an idea which days it might occur.  Always had stomach problems since being very little but getting worse. Her PCP knows about it.  Denies saddle anesthesia.   Pt also states having L shoulder problems periodically within the last 2 months.     Patient Stated Goals To be able to walk and stand longer.    Currently in Pain? Yes   Pain Score 1    Pain Onset More than a month ago            [facility] PT Assessment - 12/03/16 0952      Observation/Other Assessments  Observations 6 minute walk: 1300 ft   Modified [place] 30%     Strength   Right Hip ABduction 4/5   Left Hip ABduction 4+/5                             PT Education - 12/03/16 0911    Education provided Yes   Education Details ther-ex, HEP   Person(s) Educated Patient   Methods Explanation;Demonstration;Tactile cues;Verbal cues;Handout   Comprehension Returned demonstration;Verbalized understanding        Objectives  TTP R greater trochanter. R posterior lateral hip discomfort when going up and down steps per pt reports.    Therapeutic Exercise:   Seated clamshell isometric (hips less than 90 degrees flexion) at a pain-free level of effort 1 min x 5 with 1 min rest breaks in between to help address R greater trochanter discomfort  Reviewed and given as part of her HEP.  Pt demonstrated and verbalized understanding. Handout provided.   Gait x 6 minutes  1300 ft. R knee discomfort afterwards. Reviewed progress with pt.   Seated hip adduction ball and glute max squeeze 10x2 with 5 second holds  Minimal to no change in R knee discomfort with forward step ups  Forward step up onto 3 inch step 10x  Then with bilateral shoulder extension resisting red band  5x   Still demonstrates R posterior hip discomfort.   S/L hip abduction 1x each LE  Reviewed progress with hip strength with pt.  Improved exercise technique, movement at target joints, use of target muscles after min to mod verbal, visual, tactile cues.    Manual therapy  Supine gentle medial rotational mobilization to R tibia grade 1 to 3- to decrease knee pain to promote ability to perform standing exercise for her back and R LE  Decreased R knee pain with forward step up onto 3 inch step    Pt demonstrates improved bilateral glute med strength, function, and gait distance with 6 minute walk test since initial evaluation. Pain level is also decreasing with worst pain level improving to 3/10 a most. Ability to negotiate stairs is also improving based on pt subjective reports but pt still demonstrates low back, hip and R knee discomfort as well as L pelvic drop and R lateral lean with stepping up onto and off the step with her R LE. Pt will benefit from continued skilled physical therapy services to address the aforementioned deficits as well as to improve ability to perform functonal tasks with less back and R LE pain at home.         PT Long Term Goals - 12/03/16 1023      PT LONG TERM GOAL #1   Title Patient will improve her 6 minute walk distance to at least 1200 ft independent gait to promote mobility and ability to shop for groceries.    Baseline 6 minute walk: 1054 ft (10/29/2016); 1300 ft (12/03/2016)   Time 6   Period Weeks   Status Achieved     PT LONG TERM GOAL #2   Title Patient will  improve her Modified Oswestry Low Back Pain Disability Questionnaire score by at least 12% as a demonstration of improved function.    Baseline 46% (10/29/2016); 30% (12/03/2016)   Time 6   Period Weeks   Status Achieved     PT LONG TERM GOAL #3   Title Patient will improve bilateral hip abduction strength by at least 1/2  MMT grade to promote ability to perform functional tasks with less pain.    Baseline hip abduction: 4-/5 R, 4/5 L (10/29/2016); 4/5 R, 4+/5 L   Time 6   Period Weeks   Status Achieved     PT LONG TERM GOAL #4   Title Patient will improve her Modified Oswestry Low Back Pain Disability Questionnaire score to at least 20% or less as a demonstration of improved function.    Baseline 30% (25-Dec-2016)   Time 4   Period Weeks   Status New     PT LONG TERM GOAL #5   Title Pt will report decreased difficulty with ascending and descending steps at home.    Baseline Difficulty with going down steps per pt subjective reports (25-Dec-2016)   Time 4   Period Weeks   Status New               Plan - 25-Dec-2016 0857    Clinical Impression Statement Pt demonstrates improved bilateral glute med strength, function, and gait distance with 6 minute walk test since initial evaluation. Pain level is also decreasing with worst pain level improving to 3/10 a most. Ability to negotiate stairs is also improving based on pt subjective reports but pt still demonstrates low back, hip and R knee discomfort as well as L pelvic drop and R lateral lean with stepping up onto and off the step with her R LE. Pt will benefit from continued skilled physical therapy services to address the aforementioned deficits as well as to improve ability to perform functonal tasks with less back and R LE pain at home.   History and Personal Factors relevant to plan of care: please see eval   Clinical Presentation Stable   Clinical Presentation due to: improving low back and R LE symptoms, improved hip strength and  function   Clinical Decision Making Low   Rehab Potential Good   Clinical Impairments Affecting Rehab Potential pain, chronicity of condition   PT Frequency 2x / week   PT Duration 4 weeks   PT Treatment/Interventions Aquatic Therapy;Electrical Stimulation;Iontophoresis 4mg /ml Dexamethasone;Therapeutic activities;Therapeutic exercise;Neuromuscular re-education;Patient/family education;Manual techniques;Dry needling   PT Next Visit Plan trunk muscle and hip strengthening, lumbopelvic control, scapular strengthening, aquatic therapy, modalities PRN   Consulted and Agree with Plan of Care Patient      Patient will benefit from skilled therapeutic intervention in order to improve the following deficits and impairments:  Pain, Difficulty walking, Decreased strength  Visit Diagnosis: Radiculopathy, lumbar region - Plan: PT plan of care cert/re-cert  Chronic bilateral low back pain, with sciatica presence unspecified - Plan: PT plan of care cert/re-cert  Muscle weakness (generalized) - Plan: PT plan of care cert/re-cert       G-Codes - December 25, 2016 1040    Functional Assessment Tool Used (Outpatient Only) Modified Oswestry Low Back Pain Disability Questionnaire, 6 minute walk test, clinical presentation, patient interview.    Functional Limitation Mobility: Walking and moving around   Mobility: Walking and Moving Around Current Status [phone]) At least 20 percent but less than 40 percent impaired, limited or restricted   Mobility: Walking and Moving Around Goal Status [phone]) At least 1 percent but less than 20 percent impaired, limited or restricted  pt demonstrates potential for improvement      Problem List Patient Active Problem List   Diagnosis Date Noted  . Cholecystitis with cholelithiasis 02/27/2016  . Increased frequency of urination 01/27/2015  . History of night sweats 05/01/2014  . Dupuytren's contracture 07/18/2013  .  Inverted nipple 04/18/2013  . Depression 04/23/2010  .  Fibromyalgia 04/23/2010  . Hypercholesterolemia 04/23/2010  . Hypertension, benign 04/23/2010  . Hypothyroidism 04/23/2010  . Irritable colon 04/23/2010  . Osteoarthritis 04/23/2010    Thank you for your referral.  Joneen Boers PT, DPT   12/03/2016, 10:52 AM  [place] [facility] [address], [place], [address] Phone: [phone]   Fax:  [phone]  Name: Kendra Benton MRN: [id number] Date of Birth: 01-26-1941

## 2016-12-03 NOTE — Patient Instructions (Signed)
  Seated clamshell isometrics    Sitting on your bed, with strap around knees (shoulder width apart):   Press your thighs out against it at a pain free level of effort.   Hold for 1 minute.    Rest for 1 minute.   Repeat 5 times.     Perform 3 sets daily.

## 2016-12-04 ENCOUNTER — Ambulatory Visit: Payer: Medicare Other | Admitting: Physical Therapy

## 2016-12-04 DIAGNOSIS — M5416 Radiculopathy, lumbar region: Secondary | ICD-10-CM | POA: Diagnosis not present

## 2016-12-04 DIAGNOSIS — M545 Low back pain: Secondary | ICD-10-CM

## 2016-12-04 DIAGNOSIS — G8929 Other chronic pain: Secondary | ICD-10-CM

## 2016-12-04 DIAGNOSIS — M6281 Muscle weakness (generalized): Secondary | ICD-10-CM

## 2016-12-04 NOTE — Therapy (Signed)
[place] [facility] [address] [place], [place], [address] Phone: [phone]   Fax:  [phone]  Physical Therapy Treatment  Patient Details  Name: Kendra Benton MRN: [id number] Date of Birth: Apr 23, 1941 Referring Provider: Almedia Balls, MD  Encounter Date: 12/04/2016      PT End of Session - 12/04/16 1202    Visit Number 8   Number of Visits 19   Date for PT Re-Evaluation 01/01/17   Authorization Type 2   Authorization Time Period of 10 g-code   PT Start Time 0945   PT Stop Time 1030   PT Time Calculation (min) 45 min   Activity Tolerance Patient tolerated treatment well   Behavior During Therapy [facility] for tasks assessed/performed      Past Medical History:  Diagnosis Date  . Allergy   . Hypertension   . Myocardial infarction ([place])    1997  . Osteoporosis     Past Surgical History:  Procedure Laterality Date  . ABDOMINAL HYSTERECTOMY     patient has ovaries  . APPENDECTOMY    . CHOLECYSTECTOMY N/A 02/28/2016   Procedure: LAPAROSCOPIC CHOLECYSTECTOMY WITH INTRAOPERATIVE CHOLANGIOGRAM;  Surgeon: Dia Crawford III, MD;  Location: [facility] ORS;  Service: General;  Laterality: N/A;  . LUMBAR FUSION  11/09/2016   L3-L5  . SPINE SURGERY     Disc    There were no vitals filed for this visit.      Subjective Assessment - 12/04/16 1138    Subjective Pt has been taking yoga 2x week at a yoga studio. She avoid twisting too much as advised by her MD. Pt has been taking a gentle yoga class which involve poses on the floor. Pt misses doing standing postures like warrior and downward facing dog.  Pt reports her diarrhea has been better since taking EModium.  Pt goes to the pool at [employer] and performs her exercises.     Pertinent History R lumbar radiculopathy. Pt states feeling pain R posterior hip and R lateral and inferior lateral knee. On bad days, pt feels ache in bilateral LE. Also still feels pain in her low back.  Since  the surgery in 2016, pt fell and bruised a rib which did not seem to affect her back. Also picked up things that she should not have such as a large heavy cast iron pan last summer (2017) which kind of started something in her low back. Also has grandchildren in [place] and driving and riding there bothers her. Tries to get out of the car every hour. Also still does yoga which helps.   Takes gabapentin which makes her dizzy and affects her balance. Pt states that she feels like it helps but not totally.  No falls within the last 6 months.  States having diarrhea fairly frequently and sometimes comes out without knowing about it. Kind of has an idea which days it might occur.  Always had stomach problems since being very little but getting worse. Her PCP knows about it.  Denies saddle anesthesia.   Pt also states having L shoulder problems periodically within the last 2 months.     Patient Stated Goals To be able to walk and stand longer.    Pain Onset More than a month ago            [facility] PT Assessment - 12/04/16 1141      Observation/Other Assessments   Observations seated yoga pose with knees higher than hips, decreased  lumbar lordosis. ( improved spinal curves with hips elevated on towels) .  Poor alignment and execessive effort with report of discomfort in back w. warrior I, II, III. no discomfort with cues for less ROM, less lumbar lordosis, more tractioning of spine, and propioception training of feet.       Sit to Stand   Comments breathholding, grunting, with difficulty. ( less difficulty with cues for proper technique)                      [facility] PT Treatment/Exercise - 12/04/16 1144      Exercises   Exercises --  see pt instructions                PT Education - 12/04/16 1201    Education provided Yes   Education Details HEP, educated about benefits of pelvic health PT to treat IBS. Pt voiced interest once she completes her PT for her LBP.      Person(s) Educated Patient   Methods Explanation;Demonstration;Tactile cues;Verbal cues   Comprehension Returned demonstration;Verbalized understanding             PT Long Term Goals - 12/03/16 1023      PT LONG TERM GOAL #1   Title Patient will improve her 6 minute walk distance to at least 1200 ft independent gait to promote mobility and ability to shop for groceries.    Baseline 6 minute walk: 1054 ft (10/29/2016); 1300 ft (12/03/2016)   Time 6   Period Weeks   Status Achieved     PT LONG TERM GOAL #2   Title Patient will improve her Modified Oswestry Low Back Pain Disability Questionnaire score by at least 12% as a demonstration of improved function.    Baseline 46% (10/29/2016); 30% (12/03/2016)   Time 6   Period Weeks   Status Achieved     PT LONG TERM GOAL #3   Title Patient will improve bilateral hip abduction strength by at least 1/2 MMT grade to promote ability to perform functional tasks with less pain.    Baseline hip abduction: 4-/5 R, 4/5 L (10/29/2016); 4/5 R, 4+/5 L   Time 6   Period Weeks   Status Achieved     PT LONG TERM GOAL #4   Title Patient will improve her Modified Oswestry Low Back Pain Disability Questionnaire score to at least 20% or less as a demonstration of improved function.    Baseline 30% (12/03/2016)   Time 4   Period Weeks   Status New     PT LONG TERM GOAL #5   Title Pt will report decreased difficulty with ascending and descending steps at home.    Baseline Difficulty with going down steps per pt subjective reports (12/03/2016)   Time 4   Period Weeks   Status New               Plan - 12/03/16 0857    Clinical Impression Statement Pt reported less LBP with modifications to the yoga postures she performs in community class and postures she would like to return to doing on her own. Emphasized less ROM in yoga postures and not overstretching to minimize increased strain on her low back/ R hip. Also emphasized more tractioning of  spine, less upper trap overaction, relaxation of spinal mm and maintaining curves of spine in postures. Pt voiced interest in scheduling another land appt to focus on yoga modifications because she found today's session to be helpful.  She wants to continue attending her yoga classes but to tailor the poses for her back. Plan to return to aquatic session in 2 weeks. Withheld aquatics today due to pt 's report of diarrhea in the past and having only completed Imodium for 3 weeks. PT and personnel at the pool preferred pt to continue taking her medication as prescribed by her MD and to ensure complete resolution of her diarrhea. Pt voiced interest in undergoing pelvic health PT to address her IBS/ diarrhea symptoms long term.  Pt was explained how to schedule an appt with pelvic health PT who also is the aquatic therapist.    History and Personal Factors relevant to plan of care: please see eval   Clinical Presentation Stable   Clinical Presentation due to: improving low back and R LE symptoms, improved hip strength and function   Clinical Decision Making Low   Rehab Potential Good   Clinical Impairments Affecting Rehab Potential pain, chronicity of condition   PT Frequency 2x / week   PT Duration 4 weeks   PT Treatment/Interventions Aquatic Therapy;Electrical Stimulation;Iontophoresis 4mg /ml Dexamethasone;Therapeutic activities;Therapeutic exercise;Neuromuscular re-education;Patient/family education;Manual techniques;Dry needling   PT Next Visit Plan trunk muscle and hip strengthening, lumbopelvic control, scapular strengthening, aquatic therapy, modalities PRN   Consulted and Agree with Plan of Care Patient      Patient will benefit from skilled therapeutic intervention in order to improve the following deficits and impairments:     Visit Diagnosis: Radiculopathy, lumbar region  Chronic bilateral low back pain, with sciatica presence unspecified  Muscle weakness (generalized)       G-Codes  - 12/19/2016 1040    Functional Assessment Tool Used (Outpatient Only) Modified Oswestry Low Back Pain Disability Questionnaire, 6 minute walk test, clinical presentation, patient interview.    Functional Limitation Mobility: Walking and moving around   Mobility: Walking and Moving Around Current Status [phone]) At least 20 percent but less than 40 percent impaired, limited or restricted   Mobility: Walking and Moving Around Goal Status [phone]) At least 1 percent but less than 20 percent impaired, limited or restricted  pt demonstrates potential for improvement      Problem List Patient Active Problem List   Diagnosis Date Noted  . Cholecystitis with cholelithiasis 02/27/2016  . Increased frequency of urination 01/27/2015  . History of night sweats 05/01/2014  . Dupuytren's contracture 07/18/2013  . Inverted nipple 04/18/2013  . Depression 04/23/2010  . Fibromyalgia 04/23/2010  . Hypercholesterolemia 04/23/2010  . Hypertension, benign 04/23/2010  . Hypothyroidism 04/23/2010  . Irritable colon 04/23/2010  . Osteoarthritis 04/23/2010    Jerl Mina ,PT, DPT, E-RYT  12/04/2016, 12:04 PM  [place] [facility] [address] [place], [place], [address] Phone: [phone]   Fax:  [phone]  Name: Kendra Benton MRN: [id number] Date of Birth: 1940-12-04

## 2016-12-04 NOTE — Patient Instructions (Signed)
Modified yoga poses from community yoga class  _ reclined twist of hips/knees Remember to lift and scoot hips to the opposite that your knees drop to make the stretch occur in the upper low back instead of sacrum   _half lotus pose, one knee bent, other extended Remember to not overstretch the hand to the side and have tensions in the upper trap mm, back off on your range of motion  To decrease R hip pain when sidebending R, move L hand further away from hips to lean more to the L, and when stretching overhead, press weight into the R hips to feel stretch from opposite ends    Modified standing yoga postures: Warrior I : Feet are hip width apart, one behind like you are on ski tracks, front knee bent over ankle but not more forward then the ankle.  Make sure 50% weight is in the front foot/leg , 50% weight is the back foot/ leg  KEEP SPINE AT AN ANGLE INSTEAD OF PERPENDICULAR TO FLOOR TO LENGTHEN THE SPINE, AVOID SCRUNCHING OF THE SPINE.  Arms up like "V" , drop shoulders down  Palms together at the hears  3 breaths here.   Warrior II:  DO NOT DO BECAUSE OF THE TWIST   Warrior III:    Both hands on Risk manager.  Unlock knee of standing leg Foot pointed perpendicular to ground og leg in the air, hips squared to floor, TO AVOID LOW BACK PAIN, LIFT LEG AT A  LESS ANGLE FROM THE FLOOR and KEEP GAZE DOWN TO NOT PUFF OUT THE CHEST    3 breaths here.   REVERSE    : hands on chair  Feet are hip width apart, L foot one behind like you are on ski tracks,  R knee bent over ankle but not more forward then the ankle.  Make sure 50% weight is in the front foot/leg , 50% weight is the back foot/ leg    Rest R forearm lightly on top of thigh,  L hand on L hip.  Inhale lengthen spine,   Exhale turn navel to the L then the ribcage turns, look at the other wall.  Keep maintaining  50% weight is in the front foot/leg , 50% weight is the back foot/ leg  And make sure the front knee is  still pointed in the toe line of the 2nd toe.   3 breaths here.    DOWNWARD FACING DOG ON CHAIR/ COUNTER 3 direction of stretch, middle, L/R   To get up, hands on hips, walk closer to the chair/ counter, feet hip width apart, mini squat position with hands on hips, scoot hips forward then straighten up spine.

## 2016-12-09 ENCOUNTER — Telehealth: Payer: Self-pay

## 2016-12-09 ENCOUNTER — Encounter: Payer: Medicare Other | Admitting: Physical Therapy

## 2016-12-11 ENCOUNTER — Ambulatory Visit: Payer: Medicare Other | Admitting: Physical Therapy

## 2016-12-15 ENCOUNTER — Ambulatory Visit: Payer: Medicare Other | Attending: Orthopedic Surgery

## 2016-12-15 DIAGNOSIS — M6281 Muscle weakness (generalized): Secondary | ICD-10-CM | POA: Diagnosis present

## 2016-12-15 DIAGNOSIS — Z981 Arthrodesis status: Secondary | ICD-10-CM | POA: Diagnosis present

## 2016-12-15 DIAGNOSIS — M5416 Radiculopathy, lumbar region: Secondary | ICD-10-CM | POA: Insufficient documentation

## 2016-12-15 DIAGNOSIS — M48061 Spinal stenosis, lumbar region without neurogenic claudication: Secondary | ICD-10-CM | POA: Diagnosis present

## 2016-12-15 DIAGNOSIS — M545 Low back pain: Secondary | ICD-10-CM | POA: Diagnosis present

## 2016-12-15 DIAGNOSIS — G8929 Other chronic pain: Secondary | ICD-10-CM | POA: Diagnosis present

## 2016-12-15 NOTE — Therapy (Signed)
[facility] [address], [place], [address] Phone: [phone]   Fax:  [phone]  Physical Therapy Treatment  Patient Details  Name: Kendra Benton MRN: [id number] Date of Birth: September 07, 1940 Referring Provider: Almedia Balls, MD  Encounter Date: 12/15/2016      PT End of Session - 12/15/16 0946    Visit Number 9   Number of Visits 19   Date for PT Re-Evaluation 01/01/17   Authorization Type 3   Authorization Time Period of 10 g-code   PT Start Time 0946   PT Stop Time 1040   PT Time Calculation (min) 54 min   Activity Tolerance Patient tolerated treatment well   Behavior During Therapy [facility] for tasks assessed/performed      Past Medical History:  Diagnosis Date  . Allergy   . Hypertension   . Myocardial infarction ([place])    1997  . Osteoporosis     Past Surgical History:  Procedure Laterality Date  . ABDOMINAL HYSTERECTOMY     patient has ovaries  . APPENDECTOMY    . CHOLECYSTECTOMY N/A 02/28/2016   Procedure: LAPAROSCOPIC CHOLECYSTECTOMY WITH INTRAOPERATIVE CHOLANGIOGRAM;  Surgeon: Dia Crawford III, MD;  Location: [facility] ORS;  Service: General;  Laterality: N/A;  . LUMBAR FUSION  11/09/2016   L3-L5  . SPINE SURGERY     Disc    There were no vitals filed for this visit.      Subjective Assessment - 12/15/16 0948    Subjective Trips always bother her back (was out of town). Feels soreness in low back. Otherwise she did pretty well.   2/10 back pain currently, 4/10 at most for the past 7 days.  The R posterior hip and low back is better.  Getting much better with stairs with one foot after another.  Going down stairs is harder.  R anterior lateral thigh started bothering her yesterday.    Pertinent History R lumbar radiculopathy. Pt states feeling pain R posterior hip and R lateral and inferior lateral knee. On bad days, pt feels ache in bilateral LE. Also still feels pain in her low back.  Since the  surgery in 2016, pt fell and bruised a rib which did not seem to affect her back. Also picked up things that she should not have such as a large heavy cast iron pan last summer (2017) which kind of started something in her low back. Also has grandchildren in [place] and driving and riding there bothers her. Tries to get out of the car every hour. Also still does yoga which helps.   Takes gabapentin which makes her dizzy and affects her balance. Pt states that she feels like it helps but not totally.  No falls within the last 6 months.  States having diarrhea fairly frequently and sometimes comes out without knowing about it. Kind of has an idea which days it might occur.  Always had stomach problems since being very little but getting worse. Her PCP knows about it.  Denies saddle anesthesia.   Pt also states having L shoulder problems periodically within the last 2 months.     Patient Stated Goals To be able to walk and stand longer.    Currently in Pain? Yes   Pain Score 2    Pain Onset More than a month ago  PT Education - 12/15/16 [phone]    Education provided Yes   Education Details ther-ex, HEP   Person(s) Educated Patient   Methods Demonstration;Explanation;Tactile cues;Verbal cues;Handout   Comprehension Returned demonstration;Verbalized understanding        Objectives  TTP R greater trochanter.    Therapeutic Exercise:   Standing low rows resisting yellow band 10x3 with 5 second holds. Decreased low back pain.  Hands inside loop in band to not have to grip band for comfort.  Ascending and desecending 4 steps with bilateral UE assist   R LE and low back symptoms with weight bearing on R LE, causing L pelvic drop and R lateral lean  SLS on R LE with L foot on 1st regular step with bilateral UE assist 10x5 seconds for 2 sets  Side stepping 32 ft to the L and 32 ft to the R. Difficulty with trunk control. Good glute med  muscle use  Seated pallof press straight resisting double red band 10x with 5 second holds   Then 10x10 second holds  Sitting on physioball:   Manually resisted shoulder extension 10x5 seconds   Manual perturbation from PT all planes 30 seconds x 2 to promote trunk control   Seated chin tucks to promote thoracic extension to decrease low back pressure 10x5 seconds  Seated R hip extension isometrics 10x2 with 5 second holds. Decreased R thigh pain  Seated hip adduction ball squeeze with glute max squeeze 10x5 seconds        Improved exercise technique, movement at target joints, use of target muscles after min to mod verbal, visual, tactile cues.    Decreased low back pain with exercises promoting trunk muscle use, decreased R thigh pain with exercises promoting glute max muscle use in sitting. Pt still demonstrates trunk and hip weakness, especially with stair negotiation, resulting in symptoms during R LE weight bearing during stair negotiation. Pt will benefit from continued skilled physical therapy services to promote trunk and hip strength, pelvic and femoral control to promote ability to perform functional tasks with less pain.           PT Long Term Goals - 12/03/16 1023      PT LONG TERM GOAL #1   Title Patient will improve her 6 minute walk distance to at least 1200 ft independent gait to promote mobility and ability to shop for groceries.    Baseline 6 minute walk: 1054 ft (10/29/2016); 1300 ft (12/03/2016)   Time 6   Period Weeks   Status Achieved     PT LONG TERM GOAL #2   Title Patient will improve her Modified Oswestry Low Back Pain Disability Questionnaire score by at least 12% as a demonstration of improved function.    Baseline 46% (10/29/2016); 30% (12/03/2016)   Time 6   Period Weeks   Status Achieved     PT LONG TERM GOAL #3   Title Patient will improve bilateral hip abduction strength by at least 1/2 MMT grade to promote ability to perform functional  tasks with less pain.    Baseline hip abduction: 4-/5 R, 4/5 L (10/29/2016); 4/5 R, 4+/5 L   Time 6   Period Weeks   Status Achieved     PT LONG TERM GOAL #4   Title Patient will improve her Modified Oswestry Low Back Pain Disability Questionnaire score to at least 20% or less as a demonstration of improved function.    Baseline 30% (12/03/2016)   Time 4   Period Weeks  Status New     PT LONG TERM GOAL #5   Title Pt will report decreased difficulty with ascending and descending steps at home.    Baseline Difficulty with going down steps per pt subjective reports (12/03/2016)   Time 4   Period Weeks   Status New               Plan - 12/15/16 0945    Clinical Impression Statement Decreased low back pain with exercises promoting trunk muscle use, decreased R thigh pain with exercises promoting glute max muscle use in sitting. Pt still demonstrates trunk and hip weakness, especially with stair negotiation, resulting in symptoms during R LE weight bearing during stair negotiation. Pt will benefit from continued skilled physical therapy services to promote trunk and hip strength, pelvic and femoral control to promote ability to perform functional tasks with less pain.    History and Personal Factors relevant to plan of care: please see eval   Clinical Presentation Stable   Clinical Presentation due to: improving low back and R LE symptoms, improved hip strength and function   Clinical Decision Making Low   Rehab Potential Good   Clinical Impairments Affecting Rehab Potential pain, chronicity of condition   PT Frequency 2x / week   PT Duration 4 weeks   PT Treatment/Interventions Aquatic Therapy;Electrical Stimulation;Iontophoresis 4mg /ml Dexamethasone;Therapeutic activities;Therapeutic exercise;Neuromuscular re-education;Patient/family education;Manual techniques;Dry needling   PT Next Visit Plan trunk muscle and hip strengthening, lumbopelvic control, scapular strengthening, aquatic  therapy, modalities PRN   Consulted and Agree with Plan of Care Patient      Patient will benefit from skilled therapeutic intervention in order to improve the following deficits and impairments:  Pain, Difficulty walking, Decreased strength  Visit Diagnosis: Radiculopathy, lumbar region  Chronic bilateral low back pain, with sciatica presence unspecified  Muscle weakness (generalized)     Problem List Patient Active Problem List   Diagnosis Date Noted  . Cholecystitis with cholelithiasis 02/27/2016  . Increased frequency of urination 01/27/2015  . History of night sweats 05/01/2014  . Dupuytren's contracture 07/18/2013  . Inverted nipple 04/18/2013  . Depression 04/23/2010  . Fibromyalgia 04/23/2010  . Hypercholesterolemia 04/23/2010  . Hypertension, benign 04/23/2010  . Hypothyroidism 04/23/2010  . Irritable colon 04/23/2010  . Osteoarthritis 04/23/2010    Joneen Boers PT, DPT   12/15/2016, 11:03 AM  [facility] [address] [place], [address] Phone: [phone]   Fax:  [phone]  Name: KERISHA GOUGHNOUR MRN: [id number] Date of Birth: 1941-01-09

## 2016-12-15 NOTE — Patient Instructions (Addendum)
  Pallof press (straight) Sitting or standing   Pull red band back to your chest. Hold for 5 to 10 seconds.    Repeat 10 times.    Perform 3 sets daily.       Adduction: Hip - Knees Together (Sitting)   Sit with 2 folded pillows between knees. Push knees together as you squeeze your rear end muscles. Hold for _5__ seconds. Repeat _10__ times. Do __3_ times a day.  Copyright  VHI. All rights reserved.

## 2016-12-16 ENCOUNTER — Ambulatory Visit: Payer: Medicare Other | Admitting: Physical Therapy

## 2016-12-17 ENCOUNTER — Ambulatory Visit: Payer: Medicare Other

## 2016-12-17 DIAGNOSIS — G8929 Other chronic pain: Secondary | ICD-10-CM

## 2016-12-17 DIAGNOSIS — M6281 Muscle weakness (generalized): Secondary | ICD-10-CM

## 2016-12-17 DIAGNOSIS — M545 Low back pain: Secondary | ICD-10-CM

## 2016-12-17 DIAGNOSIS — M5416 Radiculopathy, lumbar region: Secondary | ICD-10-CM | POA: Diagnosis not present

## 2016-12-17 NOTE — Therapy (Signed)
[facility] [address], [place], [address] Phone: [phone]   Fax:  [phone]  Physical Therapy Treatment  Patient Details  Name: Kendra Benton MRN: [id number] Date of Birth: 1941/02/03 Referring Provider: Almedia Balls, MD  Encounter Date: 12/17/2016      PT End of Session - 12/17/16 0933    Visit Number 10   Number of Visits 19   Date for PT Re-Evaluation 01/01/17   Authorization Type 4   Authorization Time Period of 10 g-code   PT Start Time 0933   PT Stop Time 1019   PT Time Calculation (min) 46 min   Activity Tolerance Patient tolerated treatment well   Behavior During Therapy [facility] for tasks assessed/performed      Past Medical History:  Diagnosis Date  . Allergy   . Hypertension   . Myocardial infarction ([place])    1997  . Osteoporosis     Past Surgical History:  Procedure Laterality Date  . ABDOMINAL HYSTERECTOMY     patient has ovaries  . APPENDECTOMY    . CHOLECYSTECTOMY N/A 02/28/2016   Procedure: LAPAROSCOPIC CHOLECYSTECTOMY WITH INTRAOPERATIVE CHOLANGIOGRAM;  Surgeon: Dia Crawford III, MD;  Location: [facility] ORS;  Service: General;  Laterality: N/A;  . LUMBAR FUSION  11/09/2016   L3-L5  . SPINE SURGERY     Disc    There were no vitals filed for this visit.      Subjective Assessment - 12/17/16 0934    Subjective Back does not feel very good and the R posterior hip and LE (L5) aches all the way down. 4/10 currently.   Was achy all over yesterday and was very tired. Went to yoga and felt sleepy and tired.   Felt ok Monday (last session).   Was not limping yesterday. Just tired yesterday.  Noticed her R LE symptoms as soon as she got up this morning.   Was sore at both knee joints Monday afternoon. Soreness eased off.    Pertinent History R lumbar radiculopathy. Pt states feeling pain R posterior hip and R lateral and inferior lateral knee. On bad days, pt feels ache in bilateral LE.  Also still feels pain in her low back.  Since the surgery in 2016, pt fell and bruised a rib which did not seem to affect her back. Also picked up things that she should not have such as a large heavy cast iron pan last summer (2017) which kind of started something in her low back. Also has grandchildren in [place] and driving and riding there bothers her. Tries to get out of the car every hour. Also still does yoga which helps.   Takes gabapentin which makes her dizzy and affects her balance. Pt states that she feels like it helps but not totally.  No falls within the last 6 months.  States having diarrhea fairly frequently and sometimes comes out without knowing about it. Kind of has an idea which days it might occur.  Always had stomach problems since being very little but getting worse. Her PCP knows about it.  Denies saddle anesthesia.   Pt also states having L shoulder problems periodically within the last 2 months.     Patient Stated Goals To be able to walk and stand longer.    Currently in Pain? Yes   Pain Score 4    Pain Onset More than a month ago  [facility] PT Assessment - 12/17/16 0939      Observation/Other Assessments   Observations Supine hip AROM 90/90: L hip IR 32 degrees (with R posterior hip symptoms), R hip IR 21 degrees                             PT Education - 12/17/16 1001    Education provided Yes   Education Details ther-ex   [employer]) Educated Patient   Methods Explanation;Demonstration;Tactile cues;Verbal cues   Comprehension Verbalized understanding;Returned demonstration        Objectives  There-ex  Gait throughout session to test effectiveness of treatment  Manual R hip IR stretch by PT. Decreased R LE symptoms to 2/10 in sitting but same R LE symptoms when walking  Seated R hip extension isometrics 10x10 seconds for 2 sets. Slight decrease in R LE symptoms with gait  Seated manually resisted R leg press 10x3. Decreased  pain with gait  Standing blue T-band resisted leg press with bilateral UE assist 10x.  Significant improvement in gait. Improved R LE stance phase. Felt better for pt.   Then 5 more repetitions  Seated hip adductor ball squeeze with glute max squeeze 10x5 seconds for 2 sets   Improved exercise technique, movement at target joints, use of target muscles after min to mod verbal, visual, tactile cues.    Improved ability to ambulate with improved R LE stance time, and decreased R LE discomfort after exercises promote R glute max muscle strengthening. No R LE symptoms with gait after session, 2/10 R posterior hip pain.             PT Long Term Goals - 12/03/16 1023      PT LONG TERM GOAL #1   Title Patient will improve her 6 minute walk distance to at least 1200 ft independent gait to promote mobility and ability to shop for groceries.    Baseline 6 minute walk: 1054 ft (10/29/2016); 1300 ft (12/03/2016)   Time 6   Period Weeks   Status Achieved     PT LONG TERM GOAL #2   Title Patient will improve her Modified Oswestry Low Back Pain Disability Questionnaire score by at least 12% as a demonstration of improved function.    Baseline 46% (10/29/2016); 30% (12/03/2016)   Time 6   Period Weeks   Status Achieved     PT LONG TERM GOAL #3   Title Patient will improve bilateral hip abduction strength by at least 1/2 MMT grade to promote ability to perform functional tasks with less pain.    Baseline hip abduction: 4-/5 R, 4/5 L (10/29/2016); 4/5 R, 4+/5 L   Time 6   Period Weeks   Status Achieved     PT LONG TERM GOAL #4   Title Patient will improve her Modified Oswestry Low Back Pain Disability Questionnaire score to at least 20% or less as a demonstration of improved function.    Baseline 30% (12/03/2016)   Time 4   Period Weeks   Status New     PT LONG TERM GOAL #5   Title Pt will report decreased difficulty with ascending and descending steps at home.    Baseline Difficulty  with going down steps per pt subjective reports (12/03/2016)   Time 4   Period Weeks   Status New               Plan - 12/17/16 1001  Clinical Impression Statement Improved ability to ambulate with improved R LE stance time, and decreased R LE discomfort after exercises promote R glute max muscle strengthening. No R LE symptoms with gait after session, 2/10 R posterior hip pain.    History and Personal Factors relevant to plan of care: please see eval   Clinical Presentation Stable   Clinical Presentation due to: decreased pain after session   Clinical Decision Making Low   Rehab Potential Good   Clinical Impairments Affecting Rehab Potential pain, chronicity of condition   PT Frequency 2x / week   PT Duration 4 weeks   PT Treatment/Interventions Aquatic Therapy;Electrical Stimulation;Iontophoresis 4mg /ml Dexamethasone;Therapeutic activities;Therapeutic exercise;Neuromuscular re-education;Patient/family education;Manual techniques;Dry needling   PT Next Visit Plan trunk muscle and hip strengthening, lumbopelvic control, scapular strengthening, aquatic therapy, modalities PRN   Consulted and Agree with Plan of Care Patient      Patient will benefit from skilled therapeutic intervention in order to improve the following deficits and impairments:  Pain, Difficulty walking, Decreased strength  Visit Diagnosis: Radiculopathy, lumbar region  Chronic bilateral low back pain, with sciatica presence unspecified  Muscle weakness (generalized)     Problem List Patient Active Problem List   Diagnosis Date Noted  . Cholecystitis with cholelithiasis 02/27/2016  . Increased frequency of urination 01/27/2015  . History of night sweats 05/01/2014  . Dupuytren's contracture 07/18/2013  . Inverted nipple 04/18/2013  . Depression 04/23/2010  . Fibromyalgia 04/23/2010  . Hypercholesterolemia 04/23/2010  . Hypertension, benign 04/23/2010  . Hypothyroidism 04/23/2010  . Irritable  colon 04/23/2010  . Osteoarthritis 04/23/2010    Joneen Boers PT, DPT  12/17/2016, 2:28 PM  [facility] [address], [place], [address] Phone: [phone]   Fax:  [phone]  Name: Kendra Benton MRN: [id number] Date of Birth: 1940-06-23

## 2016-12-18 ENCOUNTER — Ambulatory Visit: Payer: Medicare Other | Admitting: Physical Therapy

## 2016-12-23 ENCOUNTER — Ambulatory Visit: Payer: Medicare Other | Admitting: Physical Therapy

## 2016-12-23 DIAGNOSIS — G8929 Other chronic pain: Secondary | ICD-10-CM

## 2016-12-23 DIAGNOSIS — M545 Low back pain: Secondary | ICD-10-CM

## 2016-12-23 DIAGNOSIS — M5416 Radiculopathy, lumbar region: Secondary | ICD-10-CM

## 2016-12-23 DIAGNOSIS — Z981 Arthrodesis status: Secondary | ICD-10-CM

## 2016-12-23 DIAGNOSIS — M6281 Muscle weakness (generalized): Secondary | ICD-10-CM

## 2016-12-23 NOTE — Therapy (Signed)
[place] MAIN [facility] SERVICES [place], [place], [address] Phone: [phone]   Fax:  [phone]  Physical Therapy Treatment  Patient Details  Name: Kendra Benton MRN: [id number] Date of Birth: May 06, 1941 Referring Provider: Almedia Balls, MD  Encounter Date: 12/23/2016      PT End of Session - 12/23/16 1010    Visit Number 11   Number of Visits 19   Date for PT Re-Evaluation 01/01/17   Authorization Type 5   Authorization Time Period of 10 g-code   PT Start Time 0940   PT Stop Time 1020   PT Time Calculation (min) 40 min   Activity Tolerance Patient tolerated treatment well   Behavior During Therapy [facility] for tasks assessed/performed      Past Medical History:  Diagnosis Date  . Allergy   . Hypertension   . Myocardial infarction ([place])    1997  . Osteoporosis     Past Surgical History:  Procedure Laterality Date  . ABDOMINAL HYSTERECTOMY     patient has ovaries  . APPENDECTOMY    . CHOLECYSTECTOMY N/A 02/28/2016   Procedure: LAPAROSCOPIC CHOLECYSTECTOMY WITH INTRAOPERATIVE CHOLANGIOGRAM;  Surgeon: Dia Crawford III, MD;  Location: [facility] ORS;  Service: General;  Laterality: N/A;  . LUMBAR FUSION  11/09/2016   L3-L5  . SPINE SURGERY     Disc    There were no vitals filed for this visit.      Subjective Assessment - 12/23/16 1000    Subjective Pt reports her balance is improving with the yoga modifications and has less back pain after the yoga classes.  5/10 pain this morning. Pt had increased pain over the past days due to doing chores, standing snd ironing clothes for 1 hr, picking up clothes, sit-kneeling-standing at church for 1 hr.     Pertinent History R lumbar radiculopathy. Pt states feeling pain R posterior hip and R lateral and inferior lateral knee. On bad days, pt feels ache in bilateral LE. Also still feels pain in her low back.  Since the surgery in 2016, pt fell and bruised a rib which did not seem to  affect her back. Also picked up things that she should not have such as a large heavy cast iron pan last summer (2017) which kind of started something in her low back. Also has grandchildren in [place] and driving and riding there bothers her. Tries to get out of the car every hour. Also still does yoga which helps.   Takes gabapentin which makes her dizzy and affects her balance. Pt states that she feels like it helps but not totally.  No falls within the last 6 months.  States having diarrhea fairly frequently and sometimes comes out without knowing about it. Kind of has an idea which days it might occur.  Always had stomach problems since being very little but getting worse. Her PCP knows about it.  Denies saddle anesthesia.   Pt also states having L shoulder problems periodically within the last 2 months.     Patient Stated Goals To be able to walk and stand longer.    Pain Onset More than a month ago                     Adult Aquatic Therapy - 12/24/16 1351      Aquatic Therapy Subjective   Subjective Reported her back aching After sidedstepping one lap. after mini squat was added in between each side  step, pt report no achiness.         O: Pt entered/exited the pool via steps with single UE support on rail.  50 ft =1 lap  Exercises performed in 3'6" depth   Stretches Cued for less lumbar lordosis w/ back stretch Figure- 4 stretch Hip flexor stretch on step       4 laps walking forward with noodles in UE  4 laps side stepping + minisquat  L / R ( after one lap, pt c/o LBP/ R hip pain. Added minisquat in between side stepping and pain resolved)  2 lapse back ward walking   Stretches  Cued for less lumbar lordosis w/ back stretch Figure- 4 stretch Hip flexor stretch on step   UE on wall/ rail  10 x 3 reps R hip ext with cue for no lumbar lordosis    10 reps RLE/ LLE step ups  Attemped stair climbing on pool steps with 45 deg body turn to rail but pt showed  difficulty. Pt had less difficulty with forward stair climbing but with pain.                    PT Long Term Goals - 12/03/16 1023      PT LONG TERM GOAL #1   Title Patient will improve her 6 minute walk distance to at least 1200 ft independent gait to promote mobility and ability to shop for groceries.    Baseline 6 minute walk: 1054 ft (10/29/2016); 1300 ft (12/03/2016)   Time 6   Period Weeks   Status Achieved     PT LONG TERM GOAL #2   Title Patient will improve her Modified Oswestry Low Back Pain Disability Questionnaire score by at least 12% as a demonstration of improved function.    Baseline 46% (10/29/2016); 30% (12/03/2016)   Time 6   Period Weeks   Status Achieved     PT LONG TERM GOAL #3   Title Patient will improve bilateral hip abduction strength by at least 1/2 MMT grade to promote ability to perform functional tasks with less pain.    Baseline hip abduction: 4-/5 R, 4/5 L (10/29/2016); 4/5 R, 4+/5 L   Time 6   Period Weeks   Status Achieved     PT LONG TERM GOAL #4   Title Patient will improve her Modified Oswestry Low Back Pain Disability Questionnaire score to at least 20% or less as a demonstration of improved function.    Baseline 30% (12/03/2016)   Time 4   Period Weeks   Status New     PT LONG TERM GOAL #5   Title Pt will report decreased difficulty with ascending and descending steps at home.    Baseline Difficulty with going down steps per pt subjective reports (12/03/2016)   Time 4   Period Weeks   Status New               Plan - 12/24/16 1406    Clinical Impression Statement Pt has had less back pain in yoga classes while applying the modifications she learned from PT last Tx. Pt had a relapse of pain after performing increased activities.  With aquatic Tx today,  pt tolerated glut strengthening exercises with neuromuscular cues for less lumbar lordosis.  Hip abduction increased R groin pain > R LBP. Forward stair climbing is less  painfulbut still painful in R groin area than with body turned at 45 deg to rail. Consider deficits in pelvic  girdle and femur/acetabulum joint as the movements that cause her pain include hip flexion ( sit to stand to kneeling at church, stair climbing, standing for long periods). PT asked pt about xrays of her hip but she stated she had it done at another facility. Also consider multifidis strengthening given her fusion surgery of lumbar segments.  Pt continues to benefit from skilled PT.        Rehab Potential Good   Clinical Impairments Affecting Rehab Potential pain, chronicity of condition   PT Frequency 2x / week   PT Duration 4 weeks   PT Treatment/Interventions Aquatic Therapy;Electrical Stimulation;Iontophoresis 4mg /ml Dexamethasone;Therapeutic activities;Therapeutic exercise;Neuromuscular re-education;Patient/family education;Manual techniques;Dry needling   PT Next Visit Plan trunk muscle and hip strengthening, lumbopelvic control, scapular strengthening, aquatic therapy, modalities PRN   Consulted and Agree with Plan of Care Patient      Patient will benefit from skilled therapeutic intervention in order to improve the following deficits and impairments:  Pain, Difficulty walking, Decreased strength  Visit Diagnosis: Radiculopathy, lumbar region  Chronic bilateral low back pain, with sciatica presence unspecified  Muscle weakness (generalized)  S/P lumbar fusion     Problem List Patient Active Problem List   Diagnosis Date Noted  . Cholecystitis with cholelithiasis 02/27/2016  . Increased frequency of urination 01/27/2015  . History of night sweats 05/01/2014  . Dupuytren's contracture 07/18/2013  . Inverted nipple 04/18/2013  . Depression 04/23/2010  . Fibromyalgia 04/23/2010  . Hypercholesterolemia 04/23/2010  . Hypertension, benign 04/23/2010  . Hypothyroidism 04/23/2010  . Irritable colon 04/23/2010  . Osteoarthritis 04/23/2010    Jerl Mina ,PT, DPT,  E-RYT  12/24/2016, 2:07 PM  [place] MAIN [facility] SERVICES [address] [place], [place], [address] Phone: [phone]   Fax:  [phone]  Name: LOVADA BARWICK MRN: [id number] Date of Birth: 1940-09-17

## 2016-12-25 ENCOUNTER — Ambulatory Visit: Payer: Medicare Other | Admitting: Physical Therapy

## 2016-12-25 DIAGNOSIS — G8929 Other chronic pain: Secondary | ICD-10-CM

## 2016-12-25 DIAGNOSIS — Z981 Arthrodesis status: Secondary | ICD-10-CM

## 2016-12-25 DIAGNOSIS — M545 Low back pain: Secondary | ICD-10-CM

## 2016-12-25 DIAGNOSIS — M5416 Radiculopathy, lumbar region: Secondary | ICD-10-CM

## 2016-12-25 DIAGNOSIS — M6281 Muscle weakness (generalized): Secondary | ICD-10-CM

## 2016-12-25 DIAGNOSIS — M48061 Spinal stenosis, lumbar region without neurogenic claudication: Secondary | ICD-10-CM

## 2016-12-25 NOTE — Therapy (Addendum)
[place] MAIN [facility] SERVICES [place], [place], [address] Phone: [phone]   Fax:  [phone]  Physical Therapy Treatment  Patient Details  Name: Kendra Benton MRN: [id number] Date of Birth: 1941-05-16 Referring Provider: Almedia Balls, MD  Encounter Date: 12/25/2016      PT End of Session - 12/25/16 2108    Visit Number 12   Number of Visits 19   Date for PT Re-Evaluation 01/01/17   Authorization Type 6   Authorization Time Period of 10 g-code   PT Start Time 1030   PT Stop Time 1115   PT Time Calculation (min) 45 min   Activity Tolerance Patient tolerated treatment well   Behavior During Therapy [facility] for tasks assessed/performed      Past Medical History:  Diagnosis Date  . Allergy   . Hypertension   . Myocardial infarction ([place])    1997  . Osteoporosis     Past Surgical History:  Procedure Laterality Date  . ABDOMINAL HYSTERECTOMY     patient has ovaries  . APPENDECTOMY    . CHOLECYSTECTOMY N/A 02/28/2016   Procedure: LAPAROSCOPIC CHOLECYSTECTOMY WITH INTRAOPERATIVE CHOLANGIOGRAM;  Surgeon: Dia Crawford III, MD;  Location: [facility] ORS;  Service: General;  Laterality: N/A;  . LUMBAR FUSION  11/09/2016   L3-L5  . SPINE SURGERY     Disc    There were no vitals filed for this visit.      Subjective Assessment - 12/25/16 2106    Subjective    Pertinent History R lumbar radiculopathy. Pt states feeling pain R posterior hip and R lateral and inferior lateral knee. On bad days, pt feels ache in bilateral LE. Also still feels pain in her low back.  Since the surgery in 2016, pt fell and bruised a rib which did not seem to affect her back. Also picked up things that she should not have such as a large heavy cast iron pan last summer (2017) which kind of started something in her low back. Also has grandchildren in [place] and driving and riding there bothers her. Tries to get out of the car every hour. Also still does yoga  which helps.   Takes gabapentin which makes her dizzy and affects her balance. Pt states that she feels like it helps but not totally.  No falls within the last 6 months.  States having diarrhea fairly frequently and sometimes comes out without knowing about it. Kind of has an idea which days it might occur.  Always had stomach problems since being very little but getting worse. Her PCP knows about it.  Denies saddle anesthesia.   Pt also states having L shoulder problems periodically within the last 2 months.     Patient Stated Goals To be able to walk and stand longer.    Pain Onset More than a month ago                     Adult Aquatic Therapy - 12/25/16 2106      Aquatic Therapy Subjective   Subjective Pt noticed R groin muscle at the end of session but had not complaints during session            O: Pt entered/exited the pool via steps with single UE support on rail.  50 ft =1 lap  Exercises performed in 3'6" depth   Stretches Cued for less lumbar lordosis w/ back stretch Figure- 4 stretch Hip flexor stretch on step  4 laps walking forward with noodles in UE  2  laps side stepping + minisquat,  w/ 1 noodle by side for low trap activation ( withheld cue for behind back because it caused back pain due to increased lumbar lordosis)  2  laps side stepping + minisquat Noodles in each hand , shoulder abduction/adduction 30 deg.   Stretches  Cued for less lumbar lordosis w/ back stretch Figure- 4 stretch Hip flexor stretch on step   UE on wall/ rail  10 x 3 reps R hip ext with cue for no lumbar lordosis                         PT Long Term Goals - 12/25/16 2109      PT LONG TERM GOAL #1   Title Patient will improve her 6 minute walk distance to at least 1200 ft independent gait to promote mobility and ability to shop for groceries.    Baseline 6 minute walk: 1054 ft (10/29/2016); 1300 ft (12/03/2016)   Time 6   Period Weeks    Status Achieved     PT LONG TERM GOAL #2   Title Patient will improve her Modified Oswestry Low Back Pain Disability Questionnaire score by at least 12% as a demonstration of improved function.    Baseline 46% (10/29/2016); 30% (12/03/2016)   Time 6   Period Weeks   Status Achieved     PT LONG TERM GOAL #3   Title Patient will improve bilateral hip abduction strength by at least 1/2 MMT grade to promote ability to perform functional tasks with less pain.    Baseline hip abduction: 4-/5 R, 4/5 L (10/29/2016); 4/5 R, 4+/5 L   Time 6   Period Weeks   Status Achieved     PT LONG TERM GOAL #4   Title Patient will improve her Modified Oswestry Low Back Pain Disability Questionnaire score to at least 20% or less as a demonstration of improved function.    Baseline 30% (12/03/2016)   Time 4   Period Weeks   Status On-going     PT LONG TERM GOAL #5   Title Pt will report decreased difficulty with ascending and descending steps at home.    Baseline Difficulty with going down steps per pt subjective reports (12/03/2016)   Time 4   Period Weeks   Status On-going               Plan - 12/25/16 2114    Clinical Impression Statement Pt advanced to thoracolumbar strengthening with hip strengthening exercises. Pt continues to benefit from skilled PT.    Rehab Potential Good   Clinical Impairments Affecting Rehab Potential pain, chronicity of condition   PT Frequency 2x / week   PT Duration 4 weeks   PT Treatment/Interventions Aquatic Therapy;Electrical Stimulation;Iontophoresis 4mg /ml Dexamethasone;Therapeutic activities;Therapeutic exercise;Neuromuscular re-education;Patient/family education;Manual techniques;Dry needling   PT Next Visit Plan trunk muscle and hip strengthening, lumbopelvic control, scapular strengthening, aquatic therapy, modalities PRN   Consulted and Agree with Plan of Care Patient      Patient will benefit from skilled therapeutic intervention in order to improve  the following deficits and impairments:  Pain, Difficulty walking, Decreased strength  Visit Diagnosis: Radiculopathy, lumbar region  Chronic bilateral low back pain, with sciatica presence unspecified  Muscle weakness (generalized)  S/P lumbar fusion  Spinal stenosis of lumbar region without neurogenic claudication     Problem List Patient Active Problem List  Diagnosis Date Noted  . Cholecystitis with cholelithiasis 02/27/2016  . Increased frequency of urination 01/27/2015  . History of night sweats 05/01/2014  . Dupuytren's contracture 07/18/2013  . Inverted nipple 04/18/2013  . Depression 04/23/2010  . Fibromyalgia 04/23/2010  . Hypercholesterolemia 04/23/2010  . Hypertension, benign 04/23/2010  . Hypothyroidism 04/23/2010  . Irritable colon 04/23/2010  . Osteoarthritis 04/23/2010    Jerl Mina ,PT, DPT, E-RYT  12/25/2016, 9:15 PM  [place] MAIN [facility] SERVICES [address] [place], [place], [address] Phone: [phone]   Fax:  [phone]  Name: Kendra Benton MRN: [id number] Date of Birth: August 14, 1940

## 2016-12-29 ENCOUNTER — Ambulatory Visit: Payer: Medicare Other

## 2016-12-30 ENCOUNTER — Ambulatory Visit: Payer: Medicare Other | Admitting: Physical Therapy

## 2016-12-30 DIAGNOSIS — G8929 Other chronic pain: Secondary | ICD-10-CM

## 2016-12-30 DIAGNOSIS — M545 Low back pain: Secondary | ICD-10-CM

## 2016-12-30 DIAGNOSIS — Z981 Arthrodesis status: Secondary | ICD-10-CM

## 2016-12-30 DIAGNOSIS — M5416 Radiculopathy, lumbar region: Secondary | ICD-10-CM | POA: Diagnosis not present

## 2016-12-30 DIAGNOSIS — M6281 Muscle weakness (generalized): Secondary | ICD-10-CM

## 2016-12-30 NOTE — Therapy (Signed)
[place] MAIN [facility] SERVICES [place], [place], [address] Phone: [phone]   Fax:  [phone]  Physical Therapy Treatment  Patient Details  Name: Kendra Benton MRN: [id number] Date of Birth: 1940-09-15 Referring Provider: Almedia Balls, MD  Encounter Date: 12/30/2016      PT End of Session - 12/30/16 1004    Visit Number 13   Number of Visits 19   Date for PT Re-Evaluation 01/01/17   Authorization Type 7   Authorization Time Period of 10 g-code   PT Start Time 0900   PT Stop Time 0945   PT Time Calculation (min) 45 min   Activity Tolerance Patient tolerated treatment well   Behavior During Therapy [facility] for tasks assessed/performed      Past Medical History:  Diagnosis Date  . Allergy   . Hypertension   . Myocardial infarction ([place])    1997  . Osteoporosis     Past Surgical History:  Procedure Laterality Date  . ABDOMINAL HYSTERECTOMY     patient has ovaries  . APPENDECTOMY    . CHOLECYSTECTOMY N/A 02/28/2016   Procedure: LAPAROSCOPIC CHOLECYSTECTOMY WITH INTRAOPERATIVE CHOLANGIOGRAM;  Surgeon: Dia Crawford III, MD;  Location: [facility] ORS;  Service: General;  Laterality: N/A;  . LUMBAR FUSION  11/09/2016   L3-L5  . SPINE SURGERY     Disc    There were no vitals filed for this visit.      Subjective Assessment - 12/30/16 0958    Subjective Pt reported she felt 30% improvement with her hip pain asfter last pool session. Pt 's orthopedist informed her that her X rays showed mild denegeration of her R hip and she will not need R hip surgery for another 5-10 years. Pt was Dx with hip bursititis and was given a shot.  Pt was cleared for performing exercises the day after receiving the shot.    Pertinent History R lumbar radiculopathy. Pt states feeling pain R posterior hip and R lateral and inferior lateral knee. On bad days, pt feels ache in bilateral LE. Also still feels pain in her low back.  Since the surgery in 2016,  pt fell and bruised a rib which did not seem to affect her back. Also picked up things that she should not have such as a large heavy cast iron pan last summer (2017) which kind of started something in her low back. Also has grandchildren in [place] and driving and riding there bothers her. Tries to get out of the car every hour. Also still does yoga which helps.   Takes gabapentin which makes her dizzy and affects her balance. Pt states that she feels like it helps but not totally.  No falls within the last 6 months.  States having diarrhea fairly frequently and sometimes comes out without knowing about it. Kind of has an idea which days it might occur.  Always had stomach problems since being very little but getting worse. Her PCP knows about it.  Denies saddle anesthesia.   Pt also states having L shoulder problems periodically within the last 2 months.     Patient Stated Goals To be able to walk and stand longer.    Pain Onset More than a month ago                     Adult Aquatic Therapy - 12/30/16 1004      Aquatic Therapy Subjective   Subjective Pt has no complaints  today         O: Pt entered/exited the pool via steps with single UE support on rail.  50 ft =1 lap  Exercises performed in 3'6" depth   Stretches:   B Hip flexor on step and twist stretch:Marland Kitchen Cued for fingers on ribcage/ pelvis for less lumbar lordosis  Wall stretch with feet on wall   Standing hip exercises: 3 way B 20 reps cued for toes pread and knees unlocked in standing leg PNF D1 Flexion  B 20 reps   Sideways walking with noodles with hands  R side only with mini squat, cued for less lumbar lordosis,   2 laps   Forward walking with thigh high 1 lap, tapping thighs, 1 lap tapping opposite thigh  Weighted waist belt  Forward walking 2 laps tapping thighs      Stretches:   B Hip flexor on step and twist stretch:Marland Kitchen Cued for fingers on ribcage/ pelvis for less lumbar lordosis  Wall stretch  with feet on wall                 PT Long Term Goals - 12/25/16 2109      PT LONG TERM GOAL #1   Title Patient will improve her 6 minute walk distance to at least 1200 ft independent gait to promote mobility and ability to shop for groceries.    Baseline 6 minute walk: 1054 ft (10/29/2016); 1300 ft (12/03/2016)   Time 6   Period Weeks   Status Achieved     PT LONG TERM GOAL #2   Title Patient will improve her Modified Oswestry Low Back Pain Disability Questionnaire score by at least 12% as a demonstration of improved function.    Baseline 46% (10/29/2016); 30% (12/03/2016)   Time 6   Period Weeks   Status Achieved     PT LONG TERM GOAL #3   Title Patient will improve bilateral hip abduction strength by at least 1/2 MMT grade to promote ability to perform functional tasks with less pain.    Baseline hip abduction: 4-/5 R, 4/5 L (10/29/2016); 4/5 R, 4+/5 L   Time 6   Period Weeks   Status Achieved     PT LONG TERM GOAL #4   Title Patient will improve her Modified Oswestry Low Back Pain Disability Questionnaire score to at least 20% or less as a demonstration of improved function.    Baseline 30% (12/03/2016)   Time 4   Period Weeks   Status On-going     PT LONG TERM GOAL #5   Title Pt will report decreased difficulty with ascending and descending steps at home.    Baseline Difficulty with going down steps per pt subjective reports (12/03/2016)   Time 4   Period Weeks   Status On-going               Plan - 12/30/16 1004    Clinical Impression Statement Pt is progressing well with pool Tx with report of 30% improvement in R hip after last session.  Pt's Xray showed mild degeneration at her hip and her MD deferred hip surgery to another 5-10 years out. Pt's MD also Dx her with R hip bursititis and gave her a shot. Pt reported MD has cleared for exercise today.     Pt required excessive cues to minimize lumbar lordosis to minimize LBP. Progressed exercises today  with hip mobility and strengthening focus. Pt reported no pain in her R hip pre/post Tx. Her  LBP felt sore.   Pt continues to benefit from skilled PT.    Rehab Potential Good   Clinical Impairments Affecting Rehab Potential pain, chronicity of condition   PT Frequency 2x / week   PT Duration 4 weeks   PT Treatment/Interventions Aquatic Therapy;Electrical Stimulation;Iontophoresis 4mg /ml Dexamethasone;Therapeutic activities;Therapeutic exercise;Neuromuscular re-education;Patient/family education;Manual techniques;Dry needling   PT Next Visit Plan trunk muscle and hip strengthening, lumbopelvic control, scapular strengthening, aquatic therapy, modalities PRN   Consulted and Agree with Plan of Care Patient      Patient will benefit from skilled therapeutic intervention in order to improve the following deficits and impairments:  Pain, Difficulty walking, Decreased strength  Visit Diagnosis: Radiculopathy, lumbar region  Chronic bilateral low back pain, with sciatica presence unspecified  Muscle weakness (generalized)  S/P lumbar fusion     Problem List Patient Active Problem List   Diagnosis Date Noted  . Cholecystitis with cholelithiasis 02/27/2016  . Increased frequency of urination 01/27/2015  . History of night sweats 05/01/2014  . Dupuytren's contracture 07/18/2013  . Inverted nipple 04/18/2013  . Depression 04/23/2010  . Fibromyalgia 04/23/2010  . Hypercholesterolemia 04/23/2010  . Hypertension, benign 04/23/2010  . Hypothyroidism 04/23/2010  . Irritable colon 04/23/2010  . Osteoarthritis 04/23/2010    Jerl Mina 12/30/2016, 10:05 AM  [place] MAIN [facility] SERVICES [address] [place], [place], [address] Phone: [phone]   Fax:  [phone]  Name: Kendra Benton MRN: [id number] Date of Birth: [birth year]

## 2017-01-01 ENCOUNTER — Ambulatory Visit: Payer: Medicare Other | Admitting: Physical Therapy

## 2017-01-01 DIAGNOSIS — M5416 Radiculopathy, lumbar region: Secondary | ICD-10-CM | POA: Diagnosis not present

## 2017-01-01 DIAGNOSIS — G8929 Other chronic pain: Secondary | ICD-10-CM

## 2017-01-01 DIAGNOSIS — M545 Low back pain: Secondary | ICD-10-CM

## 2017-01-01 DIAGNOSIS — Z981 Arthrodesis status: Secondary | ICD-10-CM

## 2017-01-01 DIAGNOSIS — M6281 Muscle weakness (generalized): Secondary | ICD-10-CM

## 2017-01-01 DIAGNOSIS — M48061 Spinal stenosis, lumbar region without neurogenic claudication: Secondary | ICD-10-CM

## 2017-01-02 NOTE — Therapy (Addendum)
[place] MAIN [facility] SERVICES [place], [place], [address] Phone: [phone]   Fax:  [phone]  Physical Therapy Treatment  Patient Details  Name: Kendra Benton MRN: [id number] Date of Birth: 05/30/1941 Referring Provider: Almedia Balls, MD  Encounter Date: 01/01/2017      PT End of Session - 01/02/17 0856    Visit Number 14   Number of Visits 19   Date for PT Re-Evaluation 01/01/17   Authorization Type 8   Authorization Time Period of 10 g-code   PT Start Time 0945   PT Stop Time 1030   PT Time Calculation (min) 45 min   Activity Tolerance Patient tolerated treatment well   Behavior During Therapy [facility] for tasks assessed/performed      Past Medical History:  Diagnosis Date  . Allergy   . Hypertension   . Myocardial infarction ([place])    1997  . Osteoporosis     Past Surgical History:  Procedure Laterality Date  . ABDOMINAL HYSTERECTOMY     patient has ovaries  . APPENDECTOMY    . CHOLECYSTECTOMY N/A 02/28/2016   Procedure: LAPAROSCOPIC CHOLECYSTECTOMY WITH INTRAOPERATIVE CHOLANGIOGRAM;  Surgeon: Dia Crawford III, MD;  Location: [facility] ORS;  Service: General;  Laterality: N/A;  . LUMBAR FUSION  11/09/2016   L3-L5  . SPINE SURGERY     Disc    There were no vitals filed for this visit.      Subjective Assessment - 01/02/17 0852    Subjective Pt reported she has only minor muscle ache along lateral thigh but no longer has pain in the R groin, R hip. Pt 's back also felt good    Pertinent History R lumbar radiculopathy. Pt states feeling pain R posterior hip and R lateral and inferior lateral knee. On bad days, pt feels ache in bilateral LE. Also still feels pain in her low back.  Since the surgery in 2016, pt fell and bruised a rib which did not seem to affect her back. Also picked up things that she should not have such as a large heavy cast iron pan last summer (2017) which kind of started something in her low back.  Also has grandchildren in [place] and driving and riding there bothers her. Tries to get out of the car every hour. Also still does yoga which helps.   Takes gabapentin which makes her dizzy and affects her balance. Pt states that she feels like it helps but not totally.  No falls within the last 6 months.  States having diarrhea fairly frequently and sometimes comes out without knowing about it. Kind of has an idea which days it might occur.  Always had stomach problems since being very little but getting worse. Her PCP knows about it.  Denies saddle anesthesia.   Pt also states having L shoulder problems periodically within the last 2 months.     Patient Stated Goals To be able to walk and stand longer.    Pain Onset More than a month ago                     Adult Aquatic Therapy - 01/02/17 0856      Aquatic Therapy Subjective   Subjective Pt has no complaints today         O: Pt entered/exited the pool via steps with single UE support on rail.  50 ft =1 lap  Exercises performed in 3'6" depth   Stretches:  B Hip flexor on step and twist stretch:Marland Kitchen Cued for fingers on ribcage/ pelvis for less lumbar lordosis  Wall stretch with feet on wall    With Weighted waist belt : Forward walking 4 laps , tapping thighs   Standing hip exercises: 3 way B 20 reps cued for toes pread and knees unlocked in standing leg PNF D1 Flexion  B 20 reps   Grape vine L and R , 2 laps each . Cued for pivoting on standing foot    BUE on rope and hand rail in a corner Noodles under armpits, buttocks  to promote more spinal lengthening  Flutter kicks for hip flexion 30 reps Hip abduction 30 reps  with knee flexion, ankle circles  30 reps     Stretches:   B Hip flexor on step and twist stretch:Marland Kitchen Cued for fingers on ribcage/ pelvis for less lumbar lordosis  Wall stretch with feet on wall                 PT Long Term Goals - 12/25/16 2109      PT LONG TERM GOAL  #1   Title Patient will improve her 6 minute walk distance to at least 1200 ft independent gait to promote mobility and ability to shop for groceries.    Baseline 6 minute walk: 1054 ft (10/29/2016); 1300 ft (12/03/2016)   Time 6   Period Weeks   Status Achieved     PT LONG TERM GOAL #2   Title Patient will improve her Modified Oswestry Low Back Pain Disability Questionnaire score by at least 12% as a demonstration of improved function.    Baseline 46% (10/29/2016); 30% (12/03/2016)   Time 6   Period Weeks   Status Achieved     PT LONG TERM GOAL #3   Title Patient will improve bilateral hip abduction strength by at least 1/2 MMT grade to promote ability to perform functional tasks with less pain.    Baseline hip abduction: 4-/5 R, 4/5 L (10/29/2016); 4/5 R, 4+/5 L   Time 6   Period Weeks   Status Achieved     PT LONG TERM GOAL #4   Title Patient will improve her Modified Oswestry Low Back Pain Disability Questionnaire score to at least 20% or less as a demonstration of improved function.    Baseline 30% (12/03/2016)   Time 4   Period Weeks   Status On-going     PT LONG TERM GOAL #5   Title Pt will report decreased difficulty with ascending and descending steps at home.    Baseline Difficulty with going down steps per pt subjective reports (12/03/2016)   Time 4   Period Weeks   Status On-going               Plan - 01/02/17 0857    Clinical Impression Statement Pt continues to benefit from pool Tx and demo'd improved ability w/ stair navigation compared to previous visit. Pt received a shot for her R hip bursitis which is likely going to help her manage her pain while she builds strength. Pt was advanced with a weighted waist belt for more postural mm challenges and performed grapevine correctly which challenged her ability to dissociate from her lumbar/ pelvis in addition to dynamic balance. Pt continues to benefit from skilled PT     Rehab Potential Good   Clinical Impairments  Affecting Rehab Potential pain, chronicity of condition   PT Frequency 2x / week   PT Duration  8  weeks   PT Treatment/Interventions Aquatic Therapy;Electrical Stimulation;Iontophoresis 4mg /ml Dexamethasone;Therapeutic activities;Therapeutic exercise;Neuromuscular re-education;Patient/family education;Manual techniques;Dry needling   PT Next Visit Plan trunk muscle and hip strengthening, lumbopelvic control, scapular strengthening, aquatic therapy, modalities PRN   Consulted and Agree with Plan of Care Patient      Patient will benefit from skilled therapeutic intervention in order to improve the following deficits and impairments:  Pain, Difficulty walking, Decreased strength  Visit Diagnosis: Radiculopathy, lumbar region  Chronic bilateral low back pain, with sciatica presence unspecified  Muscle weakness (generalized)  S/P lumbar fusion  Spinal stenosis of lumbar region without neurogenic claudication     Problem List Patient Active Problem List   Diagnosis Date Noted  . Cholecystitis with cholelithiasis 02/27/2016  . Increased frequency of urination 01/27/2015  . History of night sweats 05/01/2014  . Dupuytren's contracture 07/18/2013  . Inverted nipple 04/18/2013  . Depression 04/23/2010  . Fibromyalgia 04/23/2010  . Hypercholesterolemia 04/23/2010  . Hypertension, benign 04/23/2010  . Hypothyroidism 04/23/2010  . Irritable colon 04/23/2010  . Osteoarthritis 04/23/2010    Jerl Mina ,PT, DPT, E-RYT  01/02/2017, 8:59 AM  [place] MAIN [facility] SERVICES [address] [place], [place], [address] Phone: [phone]   Fax:  [phone]  Name: CORNELL GABER MRN: [id number] Date of Birth: [birth year]

## 2017-01-06 ENCOUNTER — Ambulatory Visit: Payer: Medicare Other | Admitting: Physical Therapy

## 2017-01-08 ENCOUNTER — Ambulatory Visit: Payer: Medicare Other | Admitting: Physical Therapy

## 2017-01-13 ENCOUNTER — Ambulatory Visit: Payer: Medicare Other | Admitting: Physical Therapy

## 2017-01-20 ENCOUNTER — Ambulatory Visit: Payer: Medicare Other | Admitting: Physical Therapy

## 2017-01-20 ENCOUNTER — Ambulatory Visit: Payer: Medicare Other | Attending: Orthopedic Surgery | Admitting: Physical Therapy

## 2017-01-20 DIAGNOSIS — M2170 Unequal limb length (acquired), unspecified site: Secondary | ICD-10-CM | POA: Insufficient documentation

## 2017-01-20 DIAGNOSIS — M545 Low back pain: Secondary | ICD-10-CM | POA: Diagnosis present

## 2017-01-20 DIAGNOSIS — R278 Other lack of coordination: Secondary | ICD-10-CM | POA: Insufficient documentation

## 2017-01-20 DIAGNOSIS — M6281 Muscle weakness (generalized): Secondary | ICD-10-CM | POA: Insufficient documentation

## 2017-01-20 DIAGNOSIS — M5416 Radiculopathy, lumbar region: Secondary | ICD-10-CM | POA: Insufficient documentation

## 2017-01-20 DIAGNOSIS — G8929 Other chronic pain: Secondary | ICD-10-CM

## 2017-01-20 DIAGNOSIS — M48061 Spinal stenosis, lumbar region without neurogenic claudication: Secondary | ICD-10-CM | POA: Insufficient documentation

## 2017-01-20 DIAGNOSIS — Z981 Arthrodesis status: Secondary | ICD-10-CM

## 2017-01-20 NOTE — Patient Instructions (Signed)
   Standing:  10 reps on both sides x 3 x day     3 point tap   Feet are hip width Tap forward, center under hip not feet next to each other  Tap middle\, center  Tap back       _Figure-4 and then toe touch to the ground behind you along a diagonal

## 2017-01-21 NOTE — Therapy (Signed)
[place] MAIN [facility] SERVICES [address] [place], [place], [address] Phone: [phone]   Fax:  [phone]  Physical Therapy Treatment  Patient Details  Name: Kendra Benton MRN: [id number] Date of Birth: 1940-10-29 Referring Provider: Almedia Balls, MD  Encounter Date: 01/20/2017      PT End of Session - 01/20/17 1032    Visit Number 15   Number of Visits 19   Date for PT Re-Evaluation 01/01/17   Authorization Type 9   Authorization Time Period of 10 g-code   PT Start Time 1029   PT Stop Time 1114   PT Time Calculation (min) 45 min   Activity Tolerance Patient tolerated treatment well   Behavior During Therapy [facility] for tasks assessed/performed      Past Medical History:  Diagnosis Date  . Allergy   . Hypertension   . Myocardial infarction ([place])    1997  . Osteoporosis     Past Surgical History:  Procedure Laterality Date  . ABDOMINAL HYSTERECTOMY     patient has ovaries  . APPENDECTOMY    . CHOLECYSTECTOMY N/A 02/28/2016   Procedure: LAPAROSCOPIC CHOLECYSTECTOMY WITH INTRAOPERATIVE CHOLANGIOGRAM;  Surgeon: Dia Crawford III, MD;  Location: [facility] ORS;  Service: General;  Laterality: N/A;  . LUMBAR FUSION  11/09/2016   L3-L5  . SPINE SURGERY     Disc    There were no vitals filed for this visit.      Subjective Assessment - 01/20/17 1032    Subjective Pt reports her R hip pain has returned because her cortisone shot has worn off.  Pt has had diarrhea atleast the past 2 months. Pt went to see her doctor who prescribed her Imodium but it has not helped.  Pt has had stomach cramps and pain after eating over the past 2 weeks. Denied vomitting, blood in her stools, and no nausea.    Her LBP has been hurting after a long car ride from her travels.    Pertinent History R lumbar radiculopathy. Pt states feeling pain R posterior hip and R lateral and inferior lateral knee. On bad days, pt feels ache in bilateral LE. Also still feels pain  in her low back.  Since the surgery in 2016, pt fell and bruised a rib which did not seem to affect her back. Also picked up things that she should not have such as a large heavy cast iron pan last summer (2017) which kind of started something in her low back. Also has grandchildren in [place] and driving and riding there bothers her. Tries to get out of the car every hour. Also still does yoga which helps.   Takes gabapentin which makes her dizzy and affects her balance. Pt states that she feels like it helps but not totally.  No falls within the last 6 months.  States having diarrhea fairly frequently and sometimes comes out without knowing about it. Kind of has an idea which days it might occur.  Always had stomach problems since being very little but getting worse. Her PCP knows about it.  Denies saddle anesthesia.   Pt also states having L shoulder problems periodically within the last 2 months.     Patient Stated Goals To be able to walk and stand longer.    Pain Onset More than a month ago            [facility] PT Assessment - 01/20/17 1041      Observation/Other Assessments   Observations  RLE malleoli to AIIS 79 cm, L 80 cm      Palpation   SI assessment  L ASIS more superior >R, L malleoli higher than R, R SIJ more limited in hip ext (pain at 0 pre Tx, hip ext ~5 deg post Tx)      Palpation comment R obt int coccgyeus mm tight/tender, coccyx slightly deviated to R       Ambulation/Gait   Gait Comments limping,                    Pelvic Floor Special Questions - 01/20/17 1040    Diastasis Recti 3 fingers width above umbilicus            [facility] Adult PT Treatment/Exercise - 01/20/17 1041      Manual Therapy   Manual therapy comments R long exis distraction, inferior / superior mob of sacrum, MWM with hip abd, STM at R coccygeus/ obt int  mm                       PT Long Term Goals - 12/25/16 2109      PT LONG TERM GOAL #1   Title Patient will improve  her 6 minute walk distance to at least 1200 ft independent gait to promote mobility and ability to shop for groceries.    Baseline 6 minute walk: 1054 ft (10/29/2016); 1300 ft (12/03/2016)   Time 6   Period Weeks   Status Achieved     PT LONG TERM GOAL #2   Title Patient will improve her Modified Oswestry Low Back Pain Disability Questionnaire score by at least 12% as a demonstration of improved function.    Baseline 46% (10/29/2016); 30% (12/03/2016)   Time 6   Period Weeks   Status Achieved     PT LONG TERM GOAL #3   Title Patient will improve bilateral hip abduction strength by at least 1/2 MMT grade to promote ability to perform functional tasks with less pain.    Baseline hip abduction: 4-/5 R, 4/5 L (10/29/2016); 4/5 R, 4+/5 L   Time 6   Period Weeks   Status Achieved     PT LONG TERM GOAL #4   Title Patient will improve her Modified Oswestry Low Back Pain Disability Questionnaire score to at least 20% or less as a demonstration of improved function.    Baseline 30% (12/03/2016)   Time 4   Period Weeks   Status On-going     PT LONG TERM GOAL #5   Title Pt will report decreased difficulty with ascending and descending steps at home.    Baseline Difficulty with going down steps per pt subjective reports (12/03/2016)   Time 4   Period Weeks   Status On-going               Plan - 01/20/17 1108    Clinical Impression Statement Pt reported 25% improvement. Post Tx, pt demo'd increased hip ext and mobility in R SIJ. Pt's leg length difference ( R LE shorter 1 cm)  was adjusted with a shoe lift in R foot.  Pt's coccyx was less deviated to R with less mm tensions at coccgyeus post Tx. Plan to address DRA at next session which may be contributing to her digestive  issues/ diarrhea. Refer pt to GI MD if not a musculoskeletal issue.    Rehab Potential Good   Clinical Impairments Affecting Rehab Potential pain, chronicity of condition  PT Frequency 2x / week   PT Duration 4 weeks    PT Treatment/Interventions Aquatic Therapy;Electrical Stimulation;Iontophoresis 4mg /ml Dexamethasone;Therapeutic activities;Therapeutic exercise;Neuromuscular re-education;Patient/family education;Manual techniques;Dry needling   PT Next Visit Plan trunk muscle and hip strengthening, lumbopelvic control, scapular strengthening, aquatic therapy, modalities PRN   Consulted and Agree with Plan of Care Patient      Patient will benefit from skilled therapeutic intervention in order to improve the following deficits and impairments:  Pain, Difficulty walking, Decreased strength  Visit Diagnosis: Radiculopathy, lumbar region  Chronic bilateral low back pain, with sciatica presence unspecified  Muscle weakness (generalized)  S/P lumbar fusion     Problem List Patient Active Problem List   Diagnosis Date Noted  . Cholecystitis with cholelithiasis 02/27/2016  . Increased frequency of urination 01/27/2015  . History of night sweats 05/01/2014  . Dupuytren's contracture 07/18/2013  . Inverted nipple 04/18/2013  . Depression 04/23/2010  . Fibromyalgia 04/23/2010  . Hypercholesterolemia 04/23/2010  . Hypertension, benign 04/23/2010  . Hypothyroidism 04/23/2010  . Irritable colon 04/23/2010  . Osteoarthritis 04/23/2010    Jerl Mina ,PT, DPT, E-RYT  01/21/2017, 3:35 PM  [place] MAIN [facility] SERVICES [address] [place], [place], [address] Phone: [phone]   Fax:  [phone]  Name: Kendra Benton MRN: [id number] Date of Birth: 02/11/1941

## 2017-01-22 ENCOUNTER — Ambulatory Visit: Payer: Medicare Other | Admitting: Physical Therapy

## 2017-01-27 ENCOUNTER — Ambulatory Visit: Payer: Medicare Other | Admitting: Physical Therapy

## 2017-01-27 DIAGNOSIS — M5416 Radiculopathy, lumbar region: Secondary | ICD-10-CM | POA: Diagnosis not present

## 2017-01-27 DIAGNOSIS — M2170 Unequal limb length (acquired), unspecified site: Secondary | ICD-10-CM

## 2017-01-27 DIAGNOSIS — M6281 Muscle weakness (generalized): Secondary | ICD-10-CM

## 2017-01-27 DIAGNOSIS — R278 Other lack of coordination: Secondary | ICD-10-CM

## 2017-01-27 DIAGNOSIS — Z981 Arthrodesis status: Secondary | ICD-10-CM

## 2017-01-27 DIAGNOSIS — G8929 Other chronic pain: Secondary | ICD-10-CM

## 2017-01-27 DIAGNOSIS — M545 Low back pain: Secondary | ICD-10-CM

## 2017-01-27 NOTE — Patient Instructions (Addendum)
To loosen R hip  Knee to chest and slide foot forward  5 reps ____  Laying on your back with a towel under opposite thigh of the thigh you want to stretch Cross __  ankle over __  Thigh  Inhale, feel pelvic floor lengthen/diaphragm expand left and right laterally Exhale, bring thighs closer to chest by pulling towel with hands. Keep shoulders and neck down on the pillow and relaxed.   3 breaths    ____  Cross R thigh over L thigh  3 breaths    ____    Standing: Spread the toes, unlock knees iun standing leg. 3 point taps for both, 10 reps  soccerkick and tap diagonally back only on R  , 10 reps     ________________   Deep core level 2 ( handout)

## 2017-01-28 ENCOUNTER — Ambulatory Visit: Payer: Medicare Other | Admitting: Physical Therapy

## 2017-01-28 DIAGNOSIS — R278 Other lack of coordination: Secondary | ICD-10-CM

## 2017-01-28 DIAGNOSIS — M5416 Radiculopathy, lumbar region: Secondary | ICD-10-CM

## 2017-01-28 DIAGNOSIS — M2170 Unequal limb length (acquired), unspecified site: Secondary | ICD-10-CM

## 2017-01-28 DIAGNOSIS — Z981 Arthrodesis status: Secondary | ICD-10-CM

## 2017-01-28 DIAGNOSIS — M545 Low back pain: Secondary | ICD-10-CM

## 2017-01-28 DIAGNOSIS — M6281 Muscle weakness (generalized): Secondary | ICD-10-CM

## 2017-01-28 DIAGNOSIS — G8929 Other chronic pain: Secondary | ICD-10-CM

## 2017-01-28 NOTE — Therapy (Addendum)
[place] MAIN [facility] SERVICES [place], [place], [address] Phone: [phone]   Fax:  [phone]  Physical Therapy Treatment / Progress Note  Patient Details  Name: Kendra Benton MRN: [id number] Date of Birth: 11-Jun-1940 Referring Provider: Almedia Balls, MD  Encounter Date: 01/27/2017      PT End of Session - 01/28/17 0832    Visit Number 16   Number of Visits --   Date for PT Re-Evaluation 04/28/17   Authorization Type 10   Authorization Time Period of 10 g-code   PT Start Time 1005   PT Stop Time 1115   PT Time Calculation (min) 70 min   Activity Tolerance Patient tolerated treatment well   Behavior During Therapy [facility] for tasks assessed/performed      Past Medical History:  Diagnosis Date  . Allergy   . Hypertension   . Myocardial infarction ([place])    1997  . Osteoporosis     Past Surgical History:  Procedure Laterality Date  . ABDOMINAL HYSTERECTOMY     patient has ovaries  . APPENDECTOMY    . CHOLECYSTECTOMY N/A 02/28/2016   Procedure: LAPAROSCOPIC CHOLECYSTECTOMY WITH INTRAOPERATIVE CHOLANGIOGRAM;  Surgeon: Dia Crawford III, MD;  Location: [facility] ORS;  Service: General;  Laterality: N/A;  . LUMBAR FUSION  11/09/2016   L3-L5  . SPINE SURGERY     Disc    There were no vitals filed for this visit.      Subjective Assessment - 01/27/17 0919    Subjective Pt had one of the best weeks she had in 2 years after last session and getting another prednisone medication for her LBP. Pt reported feeling happy and giddy to have the energy back. Pt also states her yoga classes have helped her to feel better and she is using the modifications to spinal twist.   Pt states her diarrhea is better with new medications (Immodium) which she has started to take 2 weeks ago. But leakage is not better. Fecal leakage and gas has been an issue the past 4 months. Pt states she has not undergone a colonoscopy in 5 years.     Pertinent  History R lumbar radiculopathy. Pt states feeling pain R posterior hip and R lateral and inferior lateral knee. On bad days, pt feels ache in bilateral LE. Also still feels pain in her low back.  Since the surgery in 2016, pt fell and bruised a rib which did not seem to affect her back. Also picked up things that she should not have such as a large heavy cast iron pan last summer (2017) which kind of started something in her low back. Also has grandchildren in [place] and driving and riding there bothers her. Tries to get out of the car every hour. Also still does yoga which helps.   Takes gabapentin which makes her dizzy and affects her balance. Pt states that she feels like it helps but not totally.  No falls within the last 6 months.  States having diarrhea fairly frequently and sometimes comes out without knowing about it. Kind of has an idea which days it might occur.  Always had stomach problems since being very little but getting worse. Her PCP knows about it.  Denies saddle anesthesia.   Pt also states having L shoulder problems periodically within the last 2 months.     Patient Stated Goals To be able to walk and stand longer.    Pain Onset More than a month  ago            [facility] PT Assessment - 01/28/17 0816      Observation/Other Assessments   Observations toes adducted, decreased weightbearing in meta tarsals B      Coordination   Gross Motor Movements are Fluid and Coordinated --  minimal passive co-activation of TrA mm      Palpation   Spinal mobility increased thoracic mm tensions B, limited diaphragmatic expansion   SI assessment  ASIS symmetrical   Palpation comment no obt int tensions nor coccyx deviation. Restricted scar over R LQ abdomen, lumbar scar       Diastasis recti 3 fingers width above umbilicus                [facility] Adult PT Treatment/Exercise - 01/28/17 1287      Neuro Re-ed    Neuro Re-ed Details  deep core level 2  see pt instructions      Manual Therapy   Manual therapy comments scar releases , STM at thoracic mm                      PT Long Term Goals - 01/28/17 8676      PT LONG TERM GOAL #1   Title Patient will improve her 6 minute walk distance to at least 1200 ft independent gait to promote mobility and ability to shop for groceries.    Baseline 6 minute walk: 1054 ft (10/29/2016); 1300 ft (12/03/2016)   Time 6   Period Weeks   Status Achieved     PT LONG TERM GOAL #2   Title Patient will improve her Modified Oswestry Low Back Pain Disability Questionnaire score by at least 12% as a demonstration of improved function.    Baseline 46% (10/29/2016); 30% (12/03/2016)   Time 6   Period Weeks   Status Achieved     PT LONG TERM GOAL #3   Title Patient will improve bilateral hip abduction strength by at least 1/2 MMT grade to promote ability to perform functional tasks with less pain.    Baseline hip abduction: 4-/5 R, 4/5 L (10/29/2016); 4/5 R, 4+/5 L   Time 6   Period Weeks   Status Achieved     PT LONG TERM GOAL #4   Title Patient will improve her Modified Oswestry Low Back Pain Disability Questionnaire score to at least 20% or less as a demonstration of improved function.    Baseline 30% (12/03/2016), 36% 01/27/17    Time 4   Period Weeks   Status Partially Met     PT LONG TERM GOAL #5   Title Pt will report decreased difficulty with ascending and descending steps at home.    Baseline Difficulty with going down steps per pt subjective reports (12/03/2016)    Time 4   Period Weeks   Status On-going     Additional Long Term Goals   Additional Long Term Goals Yes     PT LONG TERM GOAL #6   Title Pt will report decreased fecal leakage episodes from daily to < 3 x week across one week in order to return t exercising in the pool   Time 12   Period Weeks   Status New     PT LONG TERM GOAL #7   Title Pt will decrease her score on PFDI from 56  % to < 51  % in order to restore pelvic floor function    Time 12  Period Weeks   Status New     PT LONG TERM GOAL #8   Title Pt will report a Bristol Stool Type of 3-5 to indicate improved stool consistency and less diarrhea to particiapte in social events   Time 12   Period Weeks   Status New   Target Date 04/28/17               Plan - 01/28/17 0842    Clinical Impression Statement Pt has achieved 3/8 goals. Pt is making improvements with her back, hip, groin pain with prednisone medication prescribed by her MD in combination with manual Tx from PT which addressed her pelvic obliquities. Modifications for yoga poses are also helping pt with her LBP in her community classes.  Shoe lift has been provided for her leg length discreprency ( Lower R LE)  which has improved her gait.  Pt is able to climb / descend stairs with less pain.  Plan to continue to strengthen her posterior lower kinetic chain of muscle for improved hip and back strength.   Pt would benefiit from working with current PT who is specialized in pelvic health and can address pt's bowel-related Sx which has limited pt from continuing to use aquatic therapy for strength training. Aquatic therapy is also one of her preferred forms of exercise. Her bowel issues have impacted her QOL over the past 4 months. Pt was advised to f/u with a GI for an updated colonoscopy which she has not had in 5 years. Pt also was provided a food diary to complete to better understand her intake of fiber for better stool consistency. Plan to address her diastasis recti and to strengthen her deep core muscles ( diaphragm, transverse abdominal mm, pelvic floor ) to improve intraabdominal pressure system for motility and postural ability given her HX of IBS and lumbar surgery.  Plan to refer pt to MD if she has no improvements with musculoskeletal Tx related to her bowel Sx. Pt continues to benefit from skilled PT.       Clinical Presentation Evolving   Rehab Potential Good   Clinical Impairments Affecting  Rehab Potential pain, chronicity of condition   PT Frequency 2x / week   PT Duration 12 weeks   PT Treatment/Interventions Aquatic Therapy;Electrical Stimulation;Iontophoresis 23m/ml Dexamethasone;Therapeutic activities;Therapeutic exercise;Neuromuscular re-education;Patient/family education;Manual techniques;Dry needling;Functional mobility training;Stair training;Moist Heat;Manual lymph drainage   PT Next Visit Plan trunk muscle and hip strengthening, lumbopelvic control, scapular strengthening, aquatic therapy, modalities PRN   Consulted and Agree with Plan of Care Patient      Patient will benefit from skilled therapeutic intervention in order to improve the following deficits and impairments:  Pain, Difficulty walking, Decreased strength, Increased fascial restricitons, Decreased range of motion, Decreased safety awareness, Decreased coordination, Postural dysfunction, Improper body mechanics, Decreased scar mobility  Visit Diagnosis: Radiculopathy, lumbar region  Chronic bilateral low back pain, with sciatica presence unspecified  Muscle weakness (generalized)  S/P lumbar fusion  Other lack of coordination  Leg length discrepancy       G-Codes - 0Sep 02, [id number]    Functional Assessment Tool Used (Outpatient Only) MODI 36%, clincial judgement     Functional Limitation Mobility: Walking and moving around   Mobility: Walking and Moving Around Current Status (([id number] At least 20 percent but less than 40 percent impaired, limited or restricted   Mobility: Walking and Moving Around Goal Status ((C1275 At least 1 percent but less than 20 percent impaired, limited or restricted  Problem List Patient Active Problem List   Diagnosis Date Noted  . Cholecystitis with cholelithiasis 02/27/2016  . Increased frequency of urination 01/27/2015  . History of night sweats 05/01/2014  . Dupuytren's contracture 07/18/2013  . Inverted nipple 04/18/2013  . Depression 04/23/2010  .  Fibromyalgia 04/23/2010  . Hypercholesterolemia 04/23/2010  . Hypertension, benign 04/23/2010  . Hypothyroidism 04/23/2010  . Irritable colon 04/23/2010  . Osteoarthritis 04/23/2010    Jerl Mina ,PT, DPT, E-RYT  01/28/2017, 10:08 AM  [place] MAIN [facility] SERVICES [address] [place], [place], [address] Phone: [phone]   Fax:  [phone]  Name: JERMISHA HOFFART MRN: [id number] Date of Birth: 1941-04-06

## 2017-01-28 NOTE — Therapy (Signed)
[place] MAIN [facility] SERVICES [place], [place], [address] Phone: [phone]   Fax:  [phone]  Physical Therapy Treatment  Patient Details  Name: Kendra Benton MRN: [id number] Date of Birth: August 07, 1940 Referring Provider: Almedia Balls, MD  Encounter Date: 01/28/2017      PT End of Session - 01/28/17 1102    Visit Number 17   Date for PT Re-Evaluation 04/28/17   Authorization Type 11   Authorization Time Period of 10 g-code   PT Start Time 1010   PT Stop Time 1108   PT Time Calculation (min) 58 min   Activity Tolerance Patient tolerated treatment well   Behavior During Therapy [facility] for tasks assessed/performed      Past Medical History:  Diagnosis Date  . Allergy   . Hypertension   . Myocardial infarction ([place])    1997  . Osteoporosis     Past Surgical History:  Procedure Laterality Date  . ABDOMINAL HYSTERECTOMY     patient has ovaries  . APPENDECTOMY    . CHOLECYSTECTOMY N/A 02/28/2016   Procedure: LAPAROSCOPIC CHOLECYSTECTOMY WITH INTRAOPERATIVE CHOLANGIOGRAM;  Surgeon: Dia Crawford III, MD;  Location: [facility] ORS;  Service: General;  Laterality: N/A;  . LUMBAR FUSION  11/09/2016   L3-L5  . SPINE SURGERY     Disc    There were no vitals filed for this visit.      Subjective Assessment - 01/28/17 1019    Subjective Pt reported soreness in her posterior buttock after last session. Today pt feels 40% better compared to the report of  25% at yesterday's session.  Today, pt reported less fecal leakage and she had a normal bowel movements compared to loose stools that occur typically in the morning.      Pertinent History R lumbar radiculopathy. Pt states feeling pain R posterior hip and R lateral and inferior lateral knee. On bad days, pt feels ache in bilateral LE. Also still feels pain in her low back.  Since the surgery in 2016, pt fell and bruised a rib which did not seem to affect her back. Also picked up things  that she should not have such as a large heavy cast iron pan last summer (2017) which kind of started something in her low back. Also has grandchildren in [place] and driving and riding there bothers her. Tries to get out of the car every hour. Also still does yoga which helps.   Takes gabapentin which makes her dizzy and affects her balance. Pt states that she feels like it helps but not totally.  No falls within the last 6 months.  States having diarrhea fairly frequently and sometimes comes out without knowing about it. Kind of has an idea which days it might occur.  Always had stomach problems since being very little but getting worse. Her PCP knows about it.  Denies saddle anesthesia.   Pt also states having L shoulder problems periodically within the last 2 months.     Patient Stated Goals To be able to walk and stand longer.    Pain Onset More than a month ago            [facility] PT Assessment - 01/28/17 1047      Palpation   Spinal mobility significant mm tensions L ilioocostalis, parspinals at T 10-12, limited intercostal ROM.       Stairclimb/ descent with alternating pattern single UE on rail without deviations / difficulty and report of significantly  decreased pain in back, hip and no groin pain             Pelvic Floor Special Questions - 01/28/17 2321    Diastasis Recti 49fngers post Tx           [facility] Adult PT Treatment/Exercise - 01/28/17 1058      Exercises   Exercises --  open book, B. reported tingling in lips, fingers after 10xB      Moist Heat Therapy   Number Minutes Moist Heat --  3 min, in semifowler position ,   Moist Heat Location Lumbar Spine;Other (comment)  thoracic      Manual Therapy   Manual therapy comments Rotational mob L, STM along thoracic mm L T10-12, R T7-10                 PT Education - 01/28/17 1102    Education provided Yes   Education Details HEP   Person(s) Educated Patient   Methods  Explanation;Demonstration;Tactile cues;Verbal cues   Comprehension Returned demonstration;Verbalized understanding             PT Long Term Goals - 01/28/17 01610     PT LONG TERM GOAL #1   Title Patient will improve her 6 minute walk distance to at least 1200 ft independent gait to promote mobility and ability to shop for groceries.    Baseline 6 minute walk: 1054 ft (10/29/2016); 1300 ft (12/03/2016)   Time 6   Period Weeks   Status Achieved     PT LONG TERM GOAL #2   Title Patient will improve her Modified Oswestry Low Back Pain Disability Questionnaire score by at least 12% as a demonstration of improved function.    Baseline 46% (10/29/2016); 30% (12/03/2016)   Time 6   Period Weeks   Status Achieved     PT LONG TERM GOAL #3   Title Patient will improve bilateral hip abduction strength by at least 1/2 MMT grade to promote ability to perform functional tasks with less pain.    Baseline hip abduction: 4-/5 R, 4/5 L (10/29/2016); 4/5 R, 4+/5 L   Time 6   Period Weeks   Status Achieved     PT LONG TERM GOAL #4   Title Patient will improve her Modified Oswestry Low Back Pain Disability Questionnaire score to at least 20% or less as a demonstration of improved function.    Baseline 30% (12/03/2016)   Time 4   Period Weeks   Status Partially Met     PT LONG TERM GOAL #5   Title Pt will report decreased difficulty with ascending and descending steps at home.    Baseline Difficulty with going down steps per pt subjective reports (12/03/2016)   Time 4   Period Weeks   Status On-going     Additional Long Term Goals   Additional Long Term Goals Yes     PT LONG TERM GOAL #6   Title Pt will report decreased fecal leakage episodes from daily to < 3 x week across one week in order to return t exercising in the pool   Time 12   Period Weeks   Status New     PT LONG TERM GOAL #7   Title Pt will decrease her score on PFDI from 56  % to < 51  % in order to restore pelvic floor  function   Time 12   Period Weeks   Status New     PT LONG  TERM GOAL #8   Title Pt will report a Bristol Stool Type of 3-5 to indicate improved stool consistency and less diarrhea to particiapte in social events   Time 12   Period Weeks   Status New   Target Date 04/28/17               Plan - 02-05-2017 1102    Clinical Impression Statement Pt continues to report more improvement with less fecal leakage, regular bowel movements since last session. Pt continues to maintain equal alignment of pelvic girdle and pt was recommended to decrease sandal wear and to wear shoes with heel lift to maintain better pelvic girdle alignment. Pt showed no difficulty with stair climb/descent and report significantly less pain.  Addressed pt's diastasis recti and thoracic mm tensions L > R after which pt demo'd increased diaphragmatic excursion and improved deep core coordination. Noted slight upper thoracic scoliotic curve. Withheld open book exericse from HEP due to pt's c/o of tingling in her hands and lips. Performed relaxation in semi fowler position to accommodate her c/o dizziness. BP 112/80 mm Hg.  Pt reported no dizziness and less tingling in her fingers post Tx. Pt appeared tearful as she spoke about the death of loved one which was related to her heart attack years ago. Pt appeared with brighter affect following session about PT provided active listening and compassion re: her expression of grief. Pt reported feeling better after speaking about the death of a family member that at occurred many years ago. Pt continues to benefit from skilled PT.  Plan to provide info about shoe lift at next session and to provide proper shoe recommendations as pt prefers to wear sandals and flip flops.  Plan to continue DRA.    Rehab Potential Good   Clinical Impairments Affecting Rehab Potential pain, chronicity of condition   PT Frequency 2x / week   PT Duration 12 weeks   PT Treatment/Interventions Aquatic  Therapy;Electrical Stimulation;Iontophoresis 74m/ml Dexamethasone;Therapeutic activities;Therapeutic exercise;Neuromuscular re-education;Patient/family education;Manual techniques;Dry needling;Functional mobility training;Stair training;Moist Heat;Manual lymph drainage   PT Next Visit Plan trunk muscle and hip strengthening, lumbopelvic control, scapular strengthening, aquatic therapy, modalities PRN   Consulted and Agree with Plan of Care Patient      Patient will benefit from skilled therapeutic intervention in order to improve the following deficits and impairments:  Pain, Difficulty walking, Decreased strength, Increased fascial restricitons, Decreased range of motion, Decreased safety awareness, Decreased coordination, Postural dysfunction, Improper body mechanics, Decreased scar mobility  Visit Diagnosis: Radiculopathy, lumbar region  Chronic bilateral low back pain, with sciatica presence unspecified  Muscle weakness (generalized)  S/P lumbar fusion  Other lack of coordination  Leg length discrepancy       G-Codes - 0Aug 30, [id number]    Functional Assessment Tool Used (Outpatient Only) MODI 36%, clincial judgement     Functional Limitation Mobility: Walking and moving around   Mobility: Walking and Moving Around Current Status (([id number] At least 20 percent but less than 40 percent impaired, limited or restricted   Mobility: Walking and Moving Around Goal Status ([id number] At least 1 percent but less than 20 percent impaired, limited or restricted      Problem List Patient Active Problem List   Diagnosis Date Noted  . Cholecystitis with cholelithiasis 02/27/2016  . Increased frequency of urination 01/27/2015  . History of night sweats 05/01/2014  . Dupuytren's contracture 07/18/2013  . Inverted nipple 04/18/2013  . Depression 04/23/2010  . Fibromyalgia 04/23/2010  . Hypercholesterolemia 04/23/2010  .  Hypertension, benign 04/23/2010  . Hypothyroidism 04/23/2010  . Irritable  colon 04/23/2010  . Osteoarthritis 04/23/2010    Jerl Mina ,PT, DPT, E-RYT  01/28/2017, 11:29 PM  [place] MAIN [facility] SERVICES [address] [place], [place], [address] Phone: [phone]   Fax:  [phone]  Name: Kendra Benton MRN: [id number] Date of Birth: 1940/11/12

## 2017-01-28 NOTE — Addendum Note (Signed)
Addended by: Jerl Mina on: 01/28/2017 11:46 PM   Modules accepted: Orders

## 2017-01-28 NOTE — Addendum Note (Signed)
Addended by: Jerl Mina on: 01/28/2017 08:40 AM   Modules accepted: Orders

## 2017-01-28 NOTE — Addendum Note (Signed)
Addended by: Jerl Mina on: 01/28/2017 08:37 AM   Modules accepted: Orders

## 2017-01-28 NOTE — Patient Instructions (Signed)
Continue with deep core level 1-2

## 2017-02-05 ENCOUNTER — Ambulatory Visit: Payer: Medicare Other | Admitting: Physical Therapy

## 2017-02-05 DIAGNOSIS — Z981 Arthrodesis status: Secondary | ICD-10-CM

## 2017-02-05 DIAGNOSIS — M48061 Spinal stenosis, lumbar region without neurogenic claudication: Secondary | ICD-10-CM

## 2017-02-05 DIAGNOSIS — M5416 Radiculopathy, lumbar region: Secondary | ICD-10-CM | POA: Diagnosis not present

## 2017-02-05 DIAGNOSIS — G8929 Other chronic pain: Secondary | ICD-10-CM

## 2017-02-05 DIAGNOSIS — R278 Other lack of coordination: Secondary | ICD-10-CM

## 2017-02-05 DIAGNOSIS — M6281 Muscle weakness (generalized): Secondary | ICD-10-CM

## 2017-02-05 DIAGNOSIS — M545 Low back pain: Secondary | ICD-10-CM

## 2017-02-05 DIAGNOSIS — M2170 Unequal limb length (acquired), unspecified site: Secondary | ICD-10-CM

## 2017-02-05 NOTE — Therapy (Signed)
[place] MAIN [facility] SERVICES [address] [place], [place], [address] Phone: [phone]   Fax:  [phone]  Physical Therapy Treatment  Patient Details  Name: Kendra Benton MRN: [id number] Date of Birth: 1940-08-25 Referring Provider: Almedia Balls, MD  Encounter Date: 02/05/2017      PT End of Session - 02/05/17 2144    Visit Number 18   Date for PT Re-Evaluation 04/28/17   Authorization Type 2   Authorization Time Period of 10 g-code   PT Start Time 1104   PT Stop Time 1205   PT Time Calculation (min) 61 min   Activity Tolerance Patient tolerated treatment well   Behavior During Therapy [facility] for tasks assessed/performed      Past Medical History:  Diagnosis Date  . Allergy   . Hypertension   . Myocardial infarction ([place])    1997  . Osteoporosis     Past Surgical History:  Procedure Laterality Date  . ABDOMINAL HYSTERECTOMY     patient has ovaries  . APPENDECTOMY    . CHOLECYSTECTOMY N/A 02/28/2016   Procedure: LAPAROSCOPIC CHOLECYSTECTOMY WITH INTRAOPERATIVE CHOLANGIOGRAM;  Surgeon: Dia Crawford III, MD;  Location: [facility] ORS;  Service: General;  Laterality: N/A;  . LUMBAR FUSION  11/09/2016   L3-L5  . SPINE SURGERY     Disc    There were no vitals filed for this visit.      Subjective Assessment - 02/05/17 1115    Subjective Pt reported she did not have radiating pain for 4 days after her last session until she experienced the original level of radaiting pain atound her R hips and back. Pt reported one different thing she did in yoga class which was twisting to the full range with the her spine and also she walked the grocery store and it felt difficult for her to push the cart. The pain came on gradually after these activities with achiness. Pt woke up the day after these activies and she was not not able to get around well. Pt limped around.      Pertinent History R lumbar radiculopathy. Pt states feeling pain R posterior  hip and R lateral and inferior lateral knee. On bad days, pt feels ache in bilateral LE. Also still feels pain in her low back.  Since the surgery in 2016, pt fell and bruised a rib which did not seem to affect her back. Also picked up things that she should not have such as a large heavy cast iron pan last summer (2017) which kind of started something in her low back. Also has grandchildren in [place] and driving and riding there bothers her. Tries to get out of the car every hour. Also still does yoga which helps.   Takes gabapentin which makes her dizzy and affects her balance. Pt states that she feels like it helps but not totally.  No falls within the last 6 months.  States having diarrhea fairly frequently and sometimes comes out without knowing about it. Kind of has an idea which days it might occur.  Always had stomach problems since being very little but getting worse. Her PCP knows about it.  Denies saddle anesthesia.   Pt also states having L shoulder problems periodically within the last 2 months.     Patient Stated Goals To be able to walk and stand longer.    Pain Onset More than a month ago            [facility]  PT Assessment - 02/05/17 2145      Sit to Stand   Comments pre Tx: genus valgus on rise. post Tx: no genu valgus      Palpation   Spinal mobility L 2 right , tenderness, slight shift R    SI assessment  R hip flex/ER/abd/IR with limited mobility and tenderness , tenderness along sacrotuberous ligament R      Ambulation/Gait   Gait Comments pre Tx, using SPC, postTx, no cane needed                     [facility] Adult PT Treatment/Exercise - 02/05/17 2145      Therapeutic Activites    Therapeutic Activities --  educated to avoid full spinal twist in yoga, showed alternat     Manual Therapy   Manual therapy comments AP mobs R hip flexion/ADD/IR  and medial mobs at L2 R Grade II , fascial release sacrotruberous ligament                       PT  Long Term Goals - 01/28/17 9233      PT LONG TERM GOAL #1   Title Patient will improve her 6 minute walk distance to at least 1200 ft independent gait to promote mobility and ability to shop for groceries.    Baseline 6 minute walk: 1054 ft (10/29/2016); 1300 ft (12/03/2016)   Time 6   Period Weeks   Status Achieved     PT LONG TERM GOAL #2   Title Patient will improve her Modified Oswestry Low Back Pain Disability Questionnaire score by at least 12% as a demonstration of improved function.    Baseline 46% (10/29/2016); 30% (12/03/2016)   Time 6   Period Weeks   Status Achieved     PT LONG TERM GOAL #3   Title Patient will improve bilateral hip abduction strength by at least 1/2 MMT grade to promote ability to perform functional tasks with less pain.    Baseline hip abduction: 4-/5 R, 4/5 L (10/29/2016); 4/5 R, 4+/5 L   Time 6   Period Weeks   Status Achieved     PT LONG TERM GOAL #4   Title Patient will improve her Modified Oswestry Low Back Pain Disability Questionnaire score to at least 20% or less as a demonstration of improved function.    Baseline 30% (12/03/2016)   Time 4   Period Weeks   Status Partially Met     PT LONG TERM GOAL #5   Title Pt will report decreased difficulty with ascending and descending steps at home.    Baseline Difficulty with going down steps per pt subjective reports (12/03/2016)   Time 4   Period Weeks   Status On-going     Additional Long Term Goals   Additional Long Term Goals Yes     PT LONG TERM GOAL #6   Title Pt will report decreased fecal leakage episodes from daily to < 3 x week across one week in order to return t exercising in the pool   Time 12   Period Weeks   Status New     PT LONG TERM GOAL #7   Title Pt will decrease her score on PFDI from 56  % to < 51  % in order to restore pelvic floor function   Time 12   Period Weeks   Status New     PT LONG TERM GOAL #8  Title Pt will report a Bristol Stool Type of 3-5 to indicate  improved stool consistency and less diarrhea to particiapte in social events   Time 12   Period Weeks   Status New   Target Date 04/28/17               Plan - 02/05/17 2149    Clinical Impression Statement Pt had a relapse of pain after attending two yoga classes, during which she performed a full spinal twist. Today, following manual Tx to release SIJ on R and realign L2, pt's radiating pain centralized. Pt was educated to avoid the spinal twist and was shown an alternative to minmize relapse of pain and accommdoate for her scoliosis. Pt was explained the importance to avoid full twists of her spine due to increased risks for injury 2/2 osteoporosis and spinal surgery. Plan to continue to show pt  modified yoga poses as PT also has yoga teacher training and therapeutic yoga training.   Pt demo'd less diastasis recti and reported her diarrhea Sx have improved with fiber supplements as well. s Pt continues to benefit from skilled PT.    Rehab Potential Good   Clinical Impairments Affecting Rehab Potential pain, chronicity of condition   PT Frequency 2x / week   PT Duration 12 weeks   PT Treatment/Interventions Aquatic Therapy;Electrical Stimulation;Iontophoresis 64m/ml Dexamethasone;Therapeutic activities;Therapeutic exercise;Neuromuscular re-education;Patient/family education;Manual techniques;Dry needling;Functional mobility training;Stair training;Moist Heat;Manual lymph drainage   PT Next Visit Plan trunk muscle and hip strengthening, lumbopelvic control, scapular strengthening, aquatic therapy, modalities PRN   Consulted and Agree with Plan of Care Patient      Patient will benefit from skilled therapeutic intervention in order to improve the following deficits and impairments:  Pain, Difficulty walking, Decreased strength, Increased fascial restricitons, Decreased range of motion, Decreased safety awareness, Decreased coordination, Postural dysfunction, Improper body mechanics,  Decreased scar mobility  Visit Diagnosis: Radiculopathy, lumbar region  Chronic bilateral low back pain, with sciatica presence unspecified  Muscle weakness (generalized)  S/P lumbar fusion  Other lack of coordination  Leg length discrepancy  Spinal stenosis of lumbar region without neurogenic claudication     Problem List Patient Active Problem List   Diagnosis Date Noted  . Cholecystitis with cholelithiasis 02/27/2016  . Increased frequency of urination 01/27/2015  . History of night sweats 05/01/2014  . Dupuytren's contracture 07/18/2013  . Inverted nipple 04/18/2013  . Depression 04/23/2010  . Fibromyalgia 04/23/2010  . Hypercholesterolemia 04/23/2010  . Hypertension, benign 04/23/2010  . Hypothyroidism 04/23/2010  . Irritable colon 04/23/2010  . Osteoarthritis 04/23/2010    YJerl Mina,PT, DPT, E-RYT  02/05/2017, 9:52 PM  [place] [facility] [address] [address]Phone: [phone]  Fax:  [phone] Name: Kendra HOFMEISTERMRN: [id number] of Birth: [birth year]

## 2017-02-10 ENCOUNTER — Ambulatory Visit: Payer: Medicare Other | Attending: Orthopedic Surgery | Admitting: Physical Therapy

## 2017-02-10 DIAGNOSIS — R278 Other lack of coordination: Secondary | ICD-10-CM

## 2017-02-10 DIAGNOSIS — M2170 Unequal limb length (acquired), unspecified site: Secondary | ICD-10-CM | POA: Diagnosis present

## 2017-02-10 DIAGNOSIS — Z981 Arthrodesis status: Secondary | ICD-10-CM

## 2017-02-10 DIAGNOSIS — M6281 Muscle weakness (generalized): Secondary | ICD-10-CM | POA: Insufficient documentation

## 2017-02-10 DIAGNOSIS — M545 Low back pain: Secondary | ICD-10-CM | POA: Diagnosis present

## 2017-02-10 DIAGNOSIS — M5416 Radiculopathy, lumbar region: Secondary | ICD-10-CM | POA: Diagnosis present

## 2017-02-10 DIAGNOSIS — G8929 Other chronic pain: Secondary | ICD-10-CM | POA: Diagnosis present

## 2017-02-10 DIAGNOSIS — M48061 Spinal stenosis, lumbar region without neurogenic claudication: Secondary | ICD-10-CM

## 2017-02-10 NOTE — Patient Instructions (Addendum)
TO STRENGTHENING HIPS:  SAME EXERCISE WITH CLAM BUT DIFFERENT ON EACH SIDE DUE TO SCOLIOSIS CURVES   To lengthen the R side:  Laying  L side: .clams  10 x 2  ( knee is low)   To strengthen R low back  :  Sideways seated with ankles to L hip,   , propped by arm  Clams 10 x 2    ___________  TO LENGTHEN ENTIRE SPINE AND L LOW BACK    By counter KEEP KNEES UNLOCKED, FOUR CORNERS OF FEET DOWN    ___________ To Strengthen legs and abs  By wall,  mini squat down, exhale rise , not lock knees, toes up, ballmounds down  10 reps   ___________ To Lengthen R midback:  When in reclined butterfly pose:  Glide R arm like snow angel, elbow bent 10 x    __________  Modifying Yoga:   MAKE SURE WHEN YOU LAY DOWN, scoot hips under, lower ribs first and then pelvis to lengthen spine   _ Instead of rocking knees all the way to the side, use exhale to engage deep muscles and allow for knees to rock just enough tot he side without lifting back off table / bed    _ reclined butterfly pose: place blocks / pillows under knees to ensure less arch in back    _ instead of extreme spinal twist, use restorative pose : hugging bolster/pillow and slight twist   ____  Deep core 6 min morning and night  KEEP other foot ( 4 corners) engaged down while the other knee moves   ______  BoDY SCAN (RELAXATION) EMAILED AUDIO

## 2017-02-11 NOTE — Therapy (Signed)
[place] MAIN [facility] SERVICES [place], [place], [address] Phone: [phone]   Fax:  [phone]  Physical Therapy Treatment  Patient Details  Name: Kendra Benton MRN: [id number] Date of Birth: January 31, 1941 Referring Provider: Almedia Balls, MD  Encounter Date: 02/10/2017      PT End of Session - 02/10/17 1306    Visit Number 19   Date for PT Re-Evaluation 04/28/17   Authorization Type 3   Authorization Time Period of 10 g-code   PT Start Time 4481   PT Stop Time 1330   PT Time Calculation (min) 55 min   Activity Tolerance Patient tolerated treatment well   Behavior During Therapy [facility] for tasks assessed/performed      Past Medical History:  Diagnosis Date  . Allergy   . Hypertension   . Myocardial infarction ([place])    1997  . Osteoporosis     Past Surgical History:  Procedure Laterality Date  . ABDOMINAL HYSTERECTOMY     patient has ovaries  . APPENDECTOMY    . CHOLECYSTECTOMY N/A 02/28/2016   Procedure: LAPAROSCOPIC CHOLECYSTECTOMY WITH INTRAOPERATIVE CHOLANGIOGRAM;  Surgeon: Dia Crawford III, MD;  Location: [facility] ORS;  Service: General;  Laterality: N/A;  . LUMBAR FUSION  11/09/2016   L3-L5  . SPINE SURGERY     Disc    There were no vitals filed for this visit.      Subjective Assessment - 02/11/17 1957    Subjective Pt has been taking Prednisone and it has been helping her with her pain. Pt was able to ride in a car laying down to visit family out of state. Pt reported feeling much better after last session and is not performing the extreme spinal twist any more in yoga class.    Pertinent History R lumbar radiculopathy. Pt states feeling pain R posterior hip and R lateral and inferior lateral knee. On bad days, pt feels ache in bilateral LE. Also still feels pain in her low back.  Since the surgery in 2016, pt fell and bruised a rib which did not seem to affect her back. Also picked up things that she should not  have such as a large heavy cast iron pan last summer (2017) which kind of started something in her low back. Also has grandchildren in [place] and driving and riding there bothers her. Tries to get out of the car every hour. Also still does yoga which helps.   Takes gabapentin which makes her dizzy and affects her balance. Pt states that she feels like it helps but not totally.  No falls within the last 6 months.  States having diarrhea fairly frequently and sometimes comes out without knowing about it. Kind of has an idea which days it might occur.  Always had stomach problems since being very little but getting worse. Her PCP knows about it.  Denies saddle anesthesia.   Pt also states having L shoulder problems periodically within the last 2 months.     Patient Stated Goals To be able to walk and stand longer.    Pain Onset More than a month ago            [facility] PT Assessment - 02/11/17 1958      Observation/Other Assessments   Observations difficulty with posterior hip strategy ( toe gripping)  , in transitioning out of forward stretch with strap at waist, withheld exercise from HEP due to poor balance    lumbar lordosis with self-selected  yoga poses      Palpation   Palpation comment increased L lumbar mm tensions, thoracic mm tensions B                      [facility] Adult PT Treatment/Exercise - 02/11/17 1957      Neuro Re-ed    Neuro Re-ed Details  spreading toes to activate transverse arch in minisquat wall slide stretch      Exercises   Exercises --  open book, B. reported tingling in lips, fingers after 10xB      Manual Therapy   Manual therapy comments thoracic and L lower mm STM                 PT Education - 02/10/17 1306    Education provided Yes   Education Details HEP   Person(s) Educated Patient   Methods Explanation;Demonstration;Tactile cues;Verbal cues;Handout   Comprehension Returned demonstration;Verbalized understanding              PT Long Term Goals - 01/28/17 4010      PT LONG TERM GOAL #1   Title Patient will improve her 6 minute walk distance to at least 1200 ft independent gait to promote mobility and ability to shop for groceries.    Baseline 6 minute walk: 1054 ft (10/29/2016); 1300 ft (12/03/2016)   Time 6   Period Weeks   Status Achieved     PT LONG TERM GOAL #2   Title Patient will improve her Modified Oswestry Low Back Pain Disability Questionnaire score by at least 12% as a demonstration of improved function.    Baseline 46% (10/29/2016); 30% (12/03/2016)   Time 6   Period Weeks   Status Achieved     PT LONG TERM GOAL #3   Title Patient will improve bilateral hip abduction strength by at least 1/2 MMT grade to promote ability to perform functional tasks with less pain.    Baseline hip abduction: 4-/5 R, 4/5 L (10/29/2016); 4/5 R, 4+/5 L   Time 6   Period Weeks   Status Achieved     PT LONG TERM GOAL #4   Title Patient will improve her Modified Oswestry Low Back Pain Disability Questionnaire score to at least 20% or less as a demonstration of improved function.    Baseline 30% (12/03/2016)   Time 4   Period Weeks   Status Partially Met     PT LONG TERM GOAL #5   Title Pt will report decreased difficulty with ascending and descending steps at home.    Baseline Difficulty with going down steps per pt subjective reports (12/03/2016)   Time 4   Period Weeks   Status On-going     Additional Long Term Goals   Additional Long Term Goals Yes     PT LONG TERM GOAL #6   Title Pt will report decreased fecal leakage episodes from daily to < 3 x week across one week in order to return t exercising in the pool   Time 12   Period Weeks   Status New     PT LONG TERM GOAL #7   Title Pt will decrease her score on PFDI from 56  % to < 51  % in order to restore pelvic floor function   Time 12   Period Weeks   Status New     PT LONG TERM GOAL #8   Title Pt will report a Bristol Stool Type of 3-5 to  indicate improved stool consistency and less diarrhea to particiapte in social events   Time 12   Period Weeks   Status New   Target Date 04/28/17               Plan - 02/11/17 1959    Clinical Impression Statement Pt is progressing well with less tenderness/ tensions at SIJ today. Pt showed minimal gait deviations compared to last session and was not using her cane. Modifed yoga poses and initated neuro-muscular reeducation to engage feet instrinsic muscles to improve balance and stability. Pt continues to benefit from skilled PT.   Rehab Potential Good   Clinical Impairments Affecting Rehab Potential pain, chronicity of condition   PT Frequency 2x / week   PT Duration 12 weeks   PT Treatment/Interventions Aquatic Therapy;Electrical Stimulation;Iontophoresis 69m/ml Dexamethasone;Therapeutic activities;Therapeutic exercise;Neuromuscular re-education;Patient/family education;Manual techniques;Dry needling;Functional mobility training;Stair training;Moist Heat;Manual lymph drainage   PT Next Visit Plan trunk muscle and hip strengthening, lumbopelvic control, scapular strengthening, aquatic therapy, modalities PRN   Consulted and Agree with Plan of Care Patient      Patient will benefit from skilled therapeutic intervention in order to improve the following deficits and impairments:  Pain, Difficulty walking, Decreased strength, Increased fascial restricitons, Decreased range of motion, Decreased safety awareness, Decreased coordination, Postural dysfunction, Improper body mechanics, Decreased scar mobility  Visit Diagnosis: Radiculopathy, lumbar region  Chronic bilateral low back pain, with sciatica presence unspecified  Muscle weakness (generalized)  S/P lumbar fusion  Other lack of coordination  Leg length discrepancy  Spinal stenosis of lumbar region without neurogenic claudication     Problem List Patient Active Problem List   Diagnosis Date Noted  . Cholecystitis  with cholelithiasis 02/27/2016  . Increased frequency of urination 01/27/2015  . History of night sweats 05/01/2014  . Dupuytren's contracture 07/18/2013  . Inverted nipple 04/18/2013  . Depression 04/23/2010  . Fibromyalgia 04/23/2010  . Hypercholesterolemia 04/23/2010  . Hypertension, benign 04/23/2010  . Hypothyroidism 04/23/2010  . Irritable colon 04/23/2010  . Osteoarthritis 04/23/2010    YJerl Mina,PT, DPT, E-RYT  02/11/2017, 8:00 PM  [place] [facility] Center[facility] [address] [address]Phone: [phone]  Fax:  [phone] Name: Kendra LENGYELMRN: [id number] of Birth: [birth year]

## 2017-02-17 ENCOUNTER — Ambulatory Visit: Payer: Medicare Other | Admitting: Physical Therapy

## 2017-02-17 DIAGNOSIS — G8929 Other chronic pain: Secondary | ICD-10-CM

## 2017-02-17 DIAGNOSIS — M5416 Radiculopathy, lumbar region: Secondary | ICD-10-CM

## 2017-02-17 DIAGNOSIS — Z981 Arthrodesis status: Secondary | ICD-10-CM

## 2017-02-17 DIAGNOSIS — M48061 Spinal stenosis, lumbar region without neurogenic claudication: Secondary | ICD-10-CM

## 2017-02-17 DIAGNOSIS — M545 Low back pain: Secondary | ICD-10-CM

## 2017-02-17 DIAGNOSIS — M2170 Unequal limb length (acquired), unspecified site: Secondary | ICD-10-CM

## 2017-02-17 DIAGNOSIS — M6281 Muscle weakness (generalized): Secondary | ICD-10-CM

## 2017-02-17 DIAGNOSIS — R278 Other lack of coordination: Secondary | ICD-10-CM

## 2017-02-17 NOTE — Patient Instructions (Addendum)
Seated:  spread toes, ballmounds down with TV watching   Standing and walking,  And warrior poses ( feet hip width and equal weight, toes spread not clawed, ballmounds down)   6 min walking in the house x 2 , ninja style , not heavy heeled   2 min of marching with thigh taps

## 2017-02-17 NOTE — Therapy (Addendum)
[place] MAIN [facility] SERVICES [place], [place], [address] Phone: [phone]   Fax:  [phone]  Physical Therapy Treatment [place] [facility] Note  Patient Details  Name: Kendra Benton MRN: [id number] Date of Birth: 1941/02/02 Referring Provider: Almedia Balls, MD  Encounter Date: 02/17/2017      PT End of Session - 02/17/17 0901    Visit Number 20   Date for PT Re-Evaluation 04/28/17   Authorization Type 4   Authorization Time Period of 10 g-code   PT Start Time 0805   PT Stop Time 0901   PT Time Calculation (min) 56 min   Activity Tolerance Patient tolerated treatment well   Behavior During Therapy [facility] for tasks assessed/performed      Past Medical History:  Diagnosis Date  . Allergy   . Hypertension   . Myocardial infarction ([place])    1997  . Osteoporosis     Past Surgical History:  Procedure Laterality Date  . ABDOMINAL HYSTERECTOMY     patient has ovaries  . APPENDECTOMY    . CHOLECYSTECTOMY N/A 02/28/2016   Procedure: LAPAROSCOPIC CHOLECYSTECTOMY WITH INTRAOPERATIVE CHOLANGIOGRAM;  Surgeon: Dia Crawford III, MD;  Location: [facility] ORS;  Service: General;  Laterality: N/A;  . LUMBAR FUSION  11/09/2016   L3-L5  . SPINE SURGERY     Disc    There were no vitals filed for this visit.      Subjective Assessment - 02/17/17 1302    Subjective Pt reports she has used the modifications of yoga poses which has not caused low back pain. Pt reports today she feels pain in the R groin/ hip area. Pt is looking into different shoe options over flip flops.     Pertinent History R lumbar radiculopathy. Pt states feeling pain R posterior hip and R lateral and inferior lateral knee. On bad days, pt feels ache in bilateral LE. Also still feels pain in her low back.  Since the surgery in 2016, pt fell and bruised a rib which did not seem to affect her back. Also picked up things that she should not have such as a large heavy cast iron pan  last summer (2017) which kind of started something in her low back. Also has grandchildren in [place] and driving and riding there bothers her. Tries to get out of the car every hour. Also still does yoga which helps.   Takes gabapentin which makes her dizzy and affects her balance. Pt states that she feels like it helps but not totally.  No falls within the last 6 months.  States having diarrhea fairly frequently and sometimes comes out without knowing about it. Kind of has an idea which days it might occur.  Always had stomach problems since being very little but getting worse. Her PCP knows about it.  Denies saddle anesthesia.   Pt also states having L shoulder problems periodically within the last 2 months.     Patient Stated Goals To be able to walk and stand longer.    Pain Onset More than a month ago            [facility] PT Assessment - 02/17/17 0841      AROM   Overall AROM  --  5th metatarsal at 20 adduction B      Palpation   Spinal mobility decreased L low back mm tensions   SI assessment  R PSIS more anterior    Palpation comment increasded tensions over vastus lateralis,  IT band,  increased scar restrictions at mid femur ( lateral)      Ambulation/Gait   Gait Comments LOB R stance phase, supination                      [facility] Adult PT Treatment/Exercise - 02/17/17 0841      Neuro Re-ed    Neuro Re-ed Details  push up phase with more pronation, spreading toes, sit to stand with alignment ,  yoga pose alignment in warrior   reviewed HEP      Manual Therapy   Manual therapy comments long axis distraction, rotation mob on R, STM, fascial release over lateral thigh and quad                 PT Education - 02/17/17 0901    Education provided Yes   Education Details HEP   Person(s) Educated Patient   Methods Explanation;Demonstration;Tactile cues;Verbal cues;Handout   Comprehension Verbalized understanding;Returned demonstration             PT  Long Term Goals - 02/17/17 1305      PT LONG TERM GOAL #1   Title Patient will improve her 6 minute walk distance to at least 1200 ft independent gait to promote mobility and ability to shop for groceries.    Baseline 6 minute walk: 1054 ft (10/29/2016); 1300 ft (12/03/2016)   Time 6   Period Weeks   Status Achieved     PT LONG TERM GOAL #2   Title Patient will improve her Modified Oswestry Low Back Pain Disability Questionnaire score by at least 12% as a demonstration of improved function.    Baseline 46% (10/29/2016); 30% (12/03/2016)   Time 6   Period Weeks   Status Achieved     PT LONG TERM GOAL #3   Title Patient will improve bilateral hip abduction strength by at least 1/2 MMT grade to promote ability to perform functional tasks with less pain.    Baseline hip abduction: 4-/5 R, 4/5 L (10/29/2016); 4/5 R, 4+/5 L   Time 6   Period Weeks   Status Achieved     PT LONG TERM GOAL #4   Title Patient will improve her Modified Oswestry Low Back Pain Disability Questionnaire score to at least 20% or less as a demonstration of improved function.    Baseline 30% (12/03/2016)   Time 4   Period Weeks   Status Partially Met     PT LONG TERM GOAL #5   Title Pt will report decreased difficulty with ascending and descending steps at home.    Baseline Difficulty with going down steps per pt subjective reports (12/03/2016)   Time 4   Period Weeks   Status On-going     PT LONG TERM GOAL #6   Title Pt will report decreased fecal leakage episodes from daily to < 3 x week across one week in order to return t exercising in the pool   Time 12   Period Weeks   Status Achieved     PT LONG TERM GOAL #7   Title Pt will decrease her score on PFDI from 56  % to < 51  % in order to restore pelvic floor function   Time 12   Period Weeks   Status On-going     PT LONG TERM GOAL #8   Title Pt will report a Bristol Stool Type of 3-5 to indicate improved stool consistency and less diarrhea to particiapte  in social events   Time 12   Period Weeks   Status New     PT LONG TERM GOAL  #9   TITLE Pt will demo less toe adduction ( -20 deg on 5th digit B to 0 deg) and more pronation in stance / push off phase in gait in order to improve balance and less R lateral thigh and groin area.    Time 12   Period Weeks   Status New               Plan - 02/17/17 0902    Clinical Impression Statement Pt has achieved 3/7 goals with LBP and R hip pain. Pt continues to require more skilled PT using a regional interdependence approach. Pt's scoliosis and leg length difference are being addressed and her pelvic alignment, sacroiliac joint mobility, and low back mm tensions are improving. Pt's diastasis recti has been addressed with manual Tx and HEP but pt will continue to require more deep core strengthening to improve postural and pelvic floor function.  Today, initiated strengthening of feet instrinsic mm, propioception, and gait mechanics which are associated with her R hip/ lateral thigh pain.   Today, addressed pelvic obliquities and increased tensions along R lateral thigh and quadriceps. Post manual Tx, tensions and pelvic obliquities improved. Pt 's gait improved with less pain, supination and LOB.  Reviewed deep core exercisesand added body mechanics with sit to stand to minimize genu valgus. Pt continues to benefit from skilled PT.    Rehab Potential Good   Clinical Impairments Affecting Rehab Potential pain, chronicity of condition   PT Frequency 2x / week   PT Duration 12 weeks   PT Treatment/Interventions Aquatic Therapy;Electrical Stimulation;Iontophoresis 27m/ml Dexamethasone;Therapeutic activities;Therapeutic exercise;Neuromuscular re-education;Patient/family education;Manual techniques;Dry needling;Functional mobility training;Stair training;Moist Heat;Manual lymph drainage   PT Next Visit Plan trunk muscle and hip strengthening, lumbopelvic control, scapular strengthening, aquatic therapy,  modalities PRN   Consulted and Agree with Plan of Care Patient      Patient will benefit from skilled therapeutic intervention in order to improve the following deficits and impairments:  Pain, Difficulty walking, Decreased strength, Increased fascial restricitons, Decreased range of motion, Decreased safety awareness, Decreased coordination, Postural dysfunction, Improper body mechanics, Decreased scar mobility  Visit Diagnosis: Radiculopathy, lumbar region  Chronic bilateral low back pain, with sciatica presence unspecified  Muscle weakness (generalized)  S/P lumbar fusion  Other lack of coordination  Leg length discrepancy  Spinal stenosis of lumbar region without neurogenic claudication     Problem List Patient Active Problem List   Diagnosis Date Noted  . Cholecystitis with cholelithiasis 02/27/2016  . Increased frequency of urination 01/27/2015  . History of night sweats 05/01/2014  . Dupuytren's contracture 07/18/2013  . Inverted nipple 04/18/2013  . Depression 04/23/2010  . Fibromyalgia 04/23/2010  . Hypercholesterolemia 04/23/2010  . Hypertension, benign 04/23/2010  . Hypothyroidism 04/23/2010  . Irritable colon 04/23/2010  . Osteoarthritis 04/23/2010    YJerl Mina,PT, DPT, E-RYT  02/17/2017, 1:14 PM  [place]MAIN [facility] SystemSERV[facility] [address] [address]Phone: [phone]  Fax:  [phone] Name: SJOHNIECE HORNBAKERMRN: [id number] of Birth: [birth year]

## 2017-02-25 ENCOUNTER — Ambulatory Visit: Payer: Medicare Other | Admitting: Physical Therapy

## 2017-02-25 DIAGNOSIS — M2170 Unequal limb length (acquired), unspecified site: Secondary | ICD-10-CM

## 2017-02-25 DIAGNOSIS — M5416 Radiculopathy, lumbar region: Secondary | ICD-10-CM | POA: Diagnosis not present

## 2017-02-25 DIAGNOSIS — M48061 Spinal stenosis, lumbar region without neurogenic claudication: Secondary | ICD-10-CM

## 2017-02-25 DIAGNOSIS — M6281 Muscle weakness (generalized): Secondary | ICD-10-CM

## 2017-02-25 DIAGNOSIS — M545 Low back pain: Secondary | ICD-10-CM

## 2017-02-25 DIAGNOSIS — G8929 Other chronic pain: Secondary | ICD-10-CM

## 2017-02-25 DIAGNOSIS — Z981 Arthrodesis status: Secondary | ICD-10-CM

## 2017-02-25 DIAGNOSIS — R278 Other lack of coordination: Secondary | ICD-10-CM

## 2017-02-25 NOTE — Patient Instructions (Signed)
Unlocked knees   Spread toes with walking   Wear wide toe box shoes and less sandals  for one week

## 2017-02-26 NOTE — Therapy (Signed)
[place] MAIN [facility] SERVICES [address] [place], [place], [address] Phone: [phone]   Fax:  [phone]  Physical Therapy Treatment  Patient Details  Name: Kendra Benton MRN: [id number] Date of Birth: September 02, 1940 Referring Provider: Almedia Balls, MD  Encounter Date: 02/25/2017      PT End of Session - 02/25/17 1033    Visit Number 21   Date for PT Re-Evaluation 04/28/17   Authorization Type 5   Authorization Time Period of 10 g-code   PT Start Time 0902   PT Stop Time 1005   PT Time Calculation (min) 63 min   Activity Tolerance Patient tolerated treatment well   Behavior During Therapy [facility] for tasks assessed/performed      Past Medical History:  Diagnosis Date  . Allergy   . Hypertension   . Myocardial infarction ([place])    1997  . Osteoporosis     Past Surgical History:  Procedure Laterality Date  . ABDOMINAL HYSTERECTOMY     patient has ovaries  . APPENDECTOMY    . CHOLECYSTECTOMY N/A 02/28/2016   Procedure: LAPAROSCOPIC CHOLECYSTECTOMY WITH INTRAOPERATIVE CHOLANGIOGRAM;  Surgeon: Dia Crawford III, MD;  Location: [facility] ORS;  Service: General;  Laterality: N/A;  . LUMBAR FUSION  11/09/2016   L3-L5  . SPINE SURGERY     Disc    There were no vitals filed for this visit.          [facility] PT Assessment - 02/26/17 1710      Observation/Other Assessments   Observations fatigued with SLS with less UE support hip 3 way, hypextended knee with LOB , toe clenching     Palpation   Palpation comment increasded tensions over vastus lateralis , IT band,  increased scar restrictions at mid femur ( lateral)                      [facility] Adult PT Treatment/Exercise - 02/26/17 1708      Therapeutic Activites    Therapeutic Activities --  education on shoe wear      Neuro Re-ed    Neuro Re-ed Details  gait training with education on foot mechanics to improve balance, standing yoga poses with more instrinsic mm  activation and less toe clenching ,  hip 3-way, no hands cued for less hyperextended knees, feet     Manual Therapy   Manual therapy comments STM along vastus lateralis, IT band                 PT Education - 02/25/17 1033    Education provided Yes   Education Details HEP   Person(s) Educated Patient   Methods Explanation;Demonstration;Tactile cues;Verbal cues;Handout   Comprehension Verbalized understanding;Returned demonstration;Verbal cues required;Tactile cues required             PT Long Term Goals - 02/17/17 1305      PT LONG TERM GOAL #1   Title Patient will improve her 6 minute walk distance to at least 1200 ft independent gait to promote mobility and ability to shop for groceries.    Baseline 6 minute walk: 1054 ft (10/29/2016); 1300 ft (12/03/2016)   Time 6   Period Weeks   Status Achieved     PT LONG TERM GOAL #2   Title Patient will improve her Modified Oswestry Low Back Pain Disability Questionnaire score by at least 12% as a demonstration of improved function.    Baseline 46% (10/29/2016); 30% (12/03/2016)   Time  6   Period Weeks   Status Achieved     PT LONG TERM GOAL #3   Title Patient will improve bilateral hip abduction strength by at least 1/2 MMT grade to promote ability to perform functional tasks with less pain.    Baseline hip abduction: 4-/5 R, 4/5 L (10/29/2016); 4/5 R, 4+/5 L   Time 6   Period Weeks   Status Achieved     PT LONG TERM GOAL #4   Title Patient will improve her Modified Oswestry Low Back Pain Disability Questionnaire score to at least 20% or less as a demonstration of improved function.    Baseline 30% (12/03/2016)   Time 4   Period Weeks   Status Partially Met     PT LONG TERM GOAL #5   Title Pt will report decreased difficulty with ascending and descending steps at home.    Baseline Difficulty with going down steps per pt subjective reports (12/03/2016)   Time 4   Period Weeks   Status On-going     PT LONG TERM GOAL  #6   Title Pt will report decreased fecal leakage episodes from daily to < 3 x week across one week in order to return t exercising in the pool   Time 12   Period Weeks   Status Achieved     PT LONG TERM GOAL #7   Title Pt will decrease her score on PFDI from 56  % to < 51  % in order to restore pelvic floor function   Time 12   Period Weeks   Status On-going     PT LONG TERM GOAL #8   Title Pt will report a Bristol Stool Type of 3-5 to indicate improved stool consistency and less diarrhea to particiapte in social events   Time 12   Period Weeks   Status New     PT LONG TERM GOAL  #9   TITLE Pt will demo less toe adduction ( -20 deg on 5th digit B to 0 deg) and more pronation in stance / push off phase in gait in order to improve balance and less R lateral thigh and groin area.    Time 12   Period Weeks   Status New               Plan - 02/25/17 1035    Clinical Impression Statement Pt showed no pelvic obliquties today which indicated good carry over from last Tx. Today, pt demo'd less tenderness and tensions along ITband and vastus lateralis on R thigh post Tx. Pt also was cued to not curl her toes when walking which is associated with her increased mm tensions  in her quads. Pt was able to demo proper walking mechanics and was advised to wear wider toe box tennis shoes and refrain from wearing sandals, flip flops, and narrow shoes for one week to retrain her feet instrinsics for improved balance and further progress with her Sx which she has reported to have improved at least 40% since last session.  Pt will benefit form skilled PT for further strengthening    Rehab Potential Good   Clinical Impairments Affecting Rehab Potential pain, chronicity of condition   PT Frequency 2x / week   PT Duration 12 weeks   PT Treatment/Interventions Aquatic Therapy;Electrical Stimulation;Iontophoresis 64m/ml Dexamethasone;Therapeutic activities;Therapeutic exercise;Neuromuscular  re-education;Patient/family education;Manual techniques;Dry needling;Functional mobility training;Stair training;Moist Heat;Manual lymph drainage   PT Next Visit Plan trunk muscle and hip strengthening, lumbopelvic control, scapular strengthening, aquatic  therapy, modalities PRN   Consulted and Agree with Plan of Care Patient      Patient will benefit from skilled therapeutic intervention in order to improve the following deficits and impairments:  Pain, Difficulty walking, Decreased strength, Increased fascial restricitons, Decreased range of motion, Decreased safety awareness, Decreased coordination, Postural dysfunction, Improper body mechanics, Decreased scar mobility  Visit Diagnosis: Radiculopathy, lumbar region  Chronic bilateral low back pain, with sciatica presence unspecified  Muscle weakness (generalized)  S/P lumbar fusion  Other lack of coordination  Leg length discrepancy  Spinal stenosis of lumbar region without neurogenic claudication     Problem List Patient Active Problem List   Diagnosis Date Noted  . Cholecystitis with cholelithiasis 02/27/2016  . Increased frequency of urination 01/27/2015  . History of night sweats 05/01/2014  . Dupuytren's contracture 07/18/2013  . Inverted nipple 04/18/2013  . Depression 04/23/2010  . Fibromyalgia 04/23/2010  . Hypercholesterolemia 04/23/2010  . Hypertension, benign 04/23/2010  . Hypothyroidism 04/23/2010  . Irritable colon 04/23/2010  . Osteoarthritis 04/23/2010    Jerl Mina ,PT, DPT, E-RYT  02/26/2017, 5:14 PM  [place] MAIN [facility] SERVICES [address] [place], [place], [address] Phone: [phone]   Fax:  [phone]  Name: EMALINE KARNES MRN: [id number] Date of Birth: 02-07-1941

## 2017-03-06 ENCOUNTER — Ambulatory Visit: Payer: Medicare Other | Admitting: Physical Therapy

## 2017-03-06 DIAGNOSIS — M5416 Radiculopathy, lumbar region: Secondary | ICD-10-CM | POA: Diagnosis not present

## 2017-03-06 DIAGNOSIS — G8929 Other chronic pain: Secondary | ICD-10-CM

## 2017-03-06 DIAGNOSIS — Z981 Arthrodesis status: Secondary | ICD-10-CM

## 2017-03-06 DIAGNOSIS — M48061 Spinal stenosis, lumbar region without neurogenic claudication: Secondary | ICD-10-CM

## 2017-03-06 DIAGNOSIS — R278 Other lack of coordination: Secondary | ICD-10-CM

## 2017-03-06 DIAGNOSIS — M2170 Unequal limb length (acquired), unspecified site: Secondary | ICD-10-CM

## 2017-03-06 DIAGNOSIS — M545 Low back pain: Secondary | ICD-10-CM

## 2017-03-06 DIAGNOSIS — M6281 Muscle weakness (generalized): Secondary | ICD-10-CM

## 2017-03-06 NOTE — Patient Instructions (Addendum)
Scoliosis specific routine: By wall  Perpendicular ,  R arm up ( rolled owel under heel of hand for proper connection to wall)  , R hip shift towards wall to length R side 10 x      R side push up  10 x      Mini squat, lean by wall, yellow band under feet, open end under L foot,  R arm over head with lean L , L hand on chair   10 x 2    __________  R lean to length the TR flank  5 sec , 5 reps   When doing clams, prop on R forearm but not on L     ___________

## 2017-03-06 NOTE — Therapy (Signed)
[place] MAIN [facility] SERVICES [place], [place], [address] Phone: [phone]   Fax:  [phone]  Physical Therapy Treatment / Progress Note   Patient Details  Name: Kendra Benton MRN: [id number] Date of Birth: 1940/11/25 Referring Provider: Almedia Balls, MD  Encounter Date: 03/06/2017      PT End of Session - 03/06/17 0914    Visit Number 22   Date for PT Re-Evaluation 04/28/17   Authorization Type 6   Authorization Time Period of 10 g-code   PT Start Time 0805   PT Stop Time 0900   PT Time Calculation (min) 55 min   Activity Tolerance Patient tolerated treatment well   Behavior During Therapy [facility] for tasks assessed/performed      Past Medical History:  Diagnosis Date  . Allergy   . Hypertension   . Myocardial infarction ([place])    1997  . Osteoporosis     Past Surgical History:  Procedure Laterality Date  . ABDOMINAL HYSTERECTOMY     patient has ovaries  . APPENDECTOMY    . CHOLECYSTECTOMY N/A 02/28/2016   Procedure: LAPAROSCOPIC CHOLECYSTECTOMY WITH INTRAOPERATIVE CHOLANGIOGRAM;  Surgeon: Dia Crawford III, MD;  Location: [facility] ORS;  Service: General;  Laterality: N/A;  . LUMBAR FUSION  11/09/2016   L3-L5  . SPINE SURGERY     Disc    There were no vitals filed for this visit.      Subjective Assessment - 03/06/17 0812    Subjective Pt reported she was able to walk longer and go shopping with minimal pain. Pt did not have her husband drop her off by the door and she was able to walk from the parking lot to the store front.   Her low back hurts constantly and it is located around the back of buttock and not at the same location by the front thigh. It keeps her from walking normally.             [facility] PT Assessment - 03/06/17 0855      Palpation   Spinal mobility R scapular dyskinesis, winging                      [facility] Adult PT Treatment/Exercise - 03/06/17 0844      Exercises   Exercises  --  open book, B. reported tingling in lips, fingers after 10xB      Manual Therapy   Manual therapy comments  MWM, hip flex/IR/add, STM, AP mob along SIJ on R                      PT Long Term Goals - 03/06/17 0856      PT LONG TERM GOAL #1   Title Patient will improve her 6 minute walk distance to at least 1200 ft independent gait to promote mobility and ability to shop for groceries.    Baseline 6 minute walk: 1054 ft (10/29/2016); 1300 ft (12/03/2016)   Time 6   Period Weeks   Status Achieved     PT LONG TERM GOAL #2   Title Patient will improve her Modified Oswestry Low Back Pain Disability Questionnaire score by at least 12% as a demonstration of improved function.    Baseline 46% (10/29/2016); 30% (12/03/2016)   Time 6   Period Weeks   Status Achieved     PT LONG TERM GOAL #3   Title Patient will improve bilateral hip abduction strength by  at least 1/2 MMT grade to promote ability to perform functional tasks with less pain.    Baseline hip abduction: 4-/5 R, 4/5 L (10/29/2016); 4/5 R, 4+/5 L   Time 6   Period Weeks   Status Achieved     PT LONG TERM GOAL #4   Title Patient will improve her Modified Oswestry Low Back Pain Disability Questionnaire score to at least 20% or less as a demonstration of improved function.    Baseline 30% (12/03/2016)   Time 4   Period Weeks   Status Partially Met     PT LONG TERM GOAL #5   Title Pt will report decreased difficulty with ascending and descending steps at home.    Baseline Difficulty with going down steps per pt subjective reports (12/03/2016)   Time 4   Period Weeks   Status On-going     Additional Long Term Goals   Additional Long Term Goals Yes     PT LONG TERM GOAL #6   Title Pt will report decreased fecal leakage episodes from daily to < 3 x week across one week in order to return t exercising in the pool   Time 12   Period Weeks   Status Achieved     PT LONG TERM GOAL #7   Title Pt will decrease her  score on PFDI from 56  % to < 51  % in order to restore pelvic floor function ( 9/28: 38% )    Time 12   Period Weeks   Status Achieved     PT LONG TERM GOAL #8   Title Pt will report a Bristol Stool Type of 3-5 to indicate improved stool consistency and less diarrhea to particiapte in social events   Time 12   Period Weeks   Status Achieved     PT LONG TERM GOAL  #9   TITLE Pt will demo less toe adduction ( -20 deg on 5th digit B to 0 deg) and more pronation in stance / push off phase in gait in order to improve balance and less R lateral thigh and groin area.    Time 12   Period Weeks   Status On-going     PT LONG TERM GOAL  #10   TITLE Pt will demo no R scapular winging and increased R flank strength/ length in order to minimize R hip pain and to walk longer distances    Time 12   Period Weeks   Status New               Plan - 03/06/17 0940    Clinical Impression Statement Pt has achieved 6/10 goals. Pt 's PFDI score has decreased with report of resolved constipation and has better forming stools instead of diarrhea. Pt's has been able to walk more and shop with minimal pain. Pt 's R posterior pain over iliac crest area improved with manual Tx which enabled pt's gait to return to normal without a limp. Addressed R sided concavity at lumbar spine 2/2 scoliosis with HEP to lengthen/ strengthen R flank/TrA/QL and to minmize reoccuring of SIJ hypomobility. Pt no longer reports the R groin pain. Pt continues to progress towards her goals with skilled PT.    Rehab Potential Good   Clinical Impairments Affecting Rehab Potential pain, chronicity of condition   PT Frequency 1x / week   PT Duration 12 weeks   PT Treatment/Interventions Aquatic Therapy;Electrical Stimulation;Iontophoresis 34m/ml Dexamethasone;Therapeutic activities;Therapeutic exercise;Neuromuscular re-education;Patient/family education;Manual techniques;Dry needling;Functional mobility training;Stair  training;Moist  Heat;Manual lymph drainage   PT Next Visit Plan trunk muscle and hip strengthening, lumbopelvic control, scapular strengthening, aquatic therapy, modalities PRN   Consulted and Agree with Plan of Care Patient      Patient will benefit from skilled therapeutic intervention in order to improve the following deficits and impairments:  Pain, Difficulty walking, Decreased strength, Increased fascial restricitons, Decreased range of motion, Decreased safety awareness, Decreased coordination, Postural dysfunction, Improper body mechanics, Decreased scar mobility  Visit Diagnosis: Radiculopathy, lumbar region  Chronic bilateral low back pain, with sciatica presence unspecified  Muscle weakness (generalized)  S/P lumbar fusion  Leg length discrepancy  Other lack of coordination  Spinal stenosis of lumbar region without neurogenic claudication     Problem List Patient Active Problem List   Diagnosis Date Noted  . Cholecystitis with cholelithiasis 02/27/2016  . Increased frequency of urination 01/27/2015  . History of night sweats 05/01/2014  . Dupuytren's contracture 07/18/2013  . Inverted nipple 04/18/2013  . Depression 04/23/2010  . Fibromyalgia 04/23/2010  . Hypercholesterolemia 04/23/2010  . Hypertension, benign 04/23/2010  . Hypothyroidism 04/23/2010  . Irritable colon 04/23/2010  . Osteoarthritis 04/23/2010    Jerl Mina ,PT, DPT, E-RYT  03/06/2017, 9:41 AM  [place] MAIN [facility] SERVICES [address] [place], [place], [address] Phone: [phone]   Fax:  [phone]  Name: Kendra Benton MRN: [id number] Date of Birth: Feb 20, 1941

## 2017-03-11 ENCOUNTER — Ambulatory Visit: Payer: Medicare Other | Attending: Orthopedic Surgery | Admitting: Physical Therapy

## 2017-03-11 DIAGNOSIS — R278 Other lack of coordination: Secondary | ICD-10-CM

## 2017-03-11 DIAGNOSIS — Z981 Arthrodesis status: Secondary | ICD-10-CM | POA: Diagnosis present

## 2017-03-11 DIAGNOSIS — M6281 Muscle weakness (generalized): Secondary | ICD-10-CM

## 2017-03-11 DIAGNOSIS — M5416 Radiculopathy, lumbar region: Secondary | ICD-10-CM | POA: Diagnosis not present

## 2017-03-11 DIAGNOSIS — G8929 Other chronic pain: Secondary | ICD-10-CM | POA: Diagnosis present

## 2017-03-11 DIAGNOSIS — M2170 Unequal limb length (acquired), unspecified site: Secondary | ICD-10-CM | POA: Diagnosis present

## 2017-03-11 DIAGNOSIS — M48061 Spinal stenosis, lumbar region without neurogenic claudication: Secondary | ICD-10-CM | POA: Diagnosis present

## 2017-03-11 DIAGNOSIS — M545 Low back pain: Secondary | ICD-10-CM | POA: Diagnosis present

## 2017-03-11 NOTE — Therapy (Signed)
[place] MAIN [facility] SERVICES [place], [place], [address] Phone: [phone]   Fax:  [phone]  Physical Therapy Treatment  Patient Details  Name: Kendra Benton MRN: [id number] Date of Birth: December 05, 1940 Referring Provider: Almedia Balls, MD  Encounter Date: 03/11/2017      PT End of Session - 03/11/17 1007    Visit Number 23   Date for PT Re-Evaluation 04/28/17   Authorization Type 7   Authorization Time Period of 10 g-code   PT Start Time 0900   PT Stop Time 1007   PT Time Calculation (min) 67 min   Activity Tolerance Patient tolerated treatment well   Behavior During Therapy [facility] for tasks assessed/performed      Past Medical History:  Diagnosis Date  . Allergy   . Hypertension   . Myocardial infarction ([place])    1997  . Osteoporosis     Past Surgical History:  Procedure Laterality Date  . ABDOMINAL HYSTERECTOMY     patient has ovaries  . APPENDECTOMY    . CHOLECYSTECTOMY N/A 02/28/2016   Procedure: LAPAROSCOPIC CHOLECYSTECTOMY WITH INTRAOPERATIVE CHOLANGIOGRAM;  Surgeon: Dia Crawford III, MD;  Location: [facility] ORS;  Service: General;  Laterality: N/A;  . LUMBAR FUSION  11/09/2016   L3-L5  . SPINE SURGERY     Disc    There were no vitals filed for this visit.      Subjective Assessment - 03/11/17 0910    Subjective Pt reported she felt good for the rest of the day after last session. Pt has been able to walk alot [place] an antique [place], the day after she went to the grocery store on her won. Pt feels tired after all this walk but it has nnot made the pain entirely worst. Pt saw her orthopedist and she has been given low dose prednisone for the pain. Pt will be checked for glaucoma and bone density by her doctor to make sure the prednisone does not have any side-effects on her.  Pt has not been going as good with her exercises this past week. Pt reports today, 30% improvement in her R side hip/ groin.                [facility] PT Assessment - 03/11/17 1002      Palpation   SI assessment  no SIJ mobility, full PROM in hip ext/ flex,    Palpation comment increased tenderness / tensions at glut me along R iliac crest, IT band and vastus lateralis on R                      [facility] Adult PT Treatment/Exercise - 03/11/17 1004      Exercises   Exercises --  see pt instructions      Manual Therapy   Manual therapy comments STM along problem areas                 PT Education - 03/11/17 1007    Education provided Yes   Education Details HEP   Person(s) Educated Patient   Methods Explanation;Demonstration;Tactile cues;Verbal cues;Handout   Comprehension Returned demonstration;Verbalized understanding             PT Long Term Goals - 03/06/17 0856      PT LONG TERM GOAL #1   Title Patient will improve her 6 minute walk distance to at least 1200 ft independent gait to promote mobility and ability to shop for groceries.  Baseline 6 minute walk: 1054 ft (10/29/2016); 1300 ft (12/03/2016)   Time 6   Period Weeks   Status Achieved     PT LONG TERM GOAL #2   Title Patient will improve her Modified Oswestry Low Back Pain Disability Questionnaire score by at least 12% as a demonstration of improved function.    Baseline 46% (10/29/2016); 30% (12/03/2016)   Time 6   Period Weeks   Status Achieved     PT LONG TERM GOAL #3   Title Patient will improve bilateral hip abduction strength by at least 1/2 MMT grade to promote ability to perform functional tasks with less pain.    Baseline hip abduction: 4-/5 R, 4/5 L (10/29/2016); 4/5 R, 4+/5 L   Time 6   Period Weeks   Status Achieved     PT LONG TERM GOAL #4   Title Patient will improve her Modified Oswestry Low Back Pain Disability Questionnaire score to at least 20% or less as a demonstration of improved function.    Baseline 30% (12/03/2016)   Time 4   Period Weeks   Status Partially Met     PT LONG TERM GOAL #5    Title Pt will report decreased difficulty with ascending and descending steps at home.    Baseline Difficulty with going down steps per pt subjective reports (12/03/2016)   Time 4   Period Weeks   Status On-going     Additional Long Term Goals   Additional Long Term Goals Yes     PT LONG TERM GOAL #6   Title Pt will report decreased fecal leakage episodes from daily to < 3 x week across one week in order to return t exercising in the pool   Time 12   Period Weeks   Status Achieved     PT LONG TERM GOAL #7   Title Pt will decrease her score on PFDI from 56  % to < 51  % in order to restore pelvic floor function ( 9/28: 38% )    Time 12   Period Weeks   Status Achieved     PT LONG TERM GOAL #8   Title Pt will report a Bristol Stool Type of 3-5 to indicate improved stool consistency and less diarrhea to particiapte in social events   Time 12   Period Weeks   Status Achieved     PT LONG TERM GOAL  #9   TITLE Pt will demo less toe adduction ( -20 deg on 5th digit B to 0 deg) and more pronation in stance / push off phase in gait in order to improve balance and less R lateral thigh and groin area.    Time 12   Period Weeks   Status On-going     PT LONG TERM GOAL  #10   TITLE Pt will demo no R scapular winging and increased R flank strength/ length in order to minimize R hip pain and to walk longer distances    Time 12   Period Weeks   Status New               Plan - 03/11/17 1007    Clinical Impression Statement Pt showed increased hip / SIJ mobility in R compared to last session. Addressed soft tissue restrictions along glut med. IT band, vastus lateralis which tenderness/ tensions remained. Pt reported her leg feeling better 30% with coritsone shot. Pt continues to show increased toe abduction and compliance to no flip flop wear.  Advanced pt to feet instrinsic strengthening ( open chain) today. Pt contiues to benefit skilled PT.    Rehab Potential Good   Clinical  Impairments Affecting Rehab Potential pain, chronicity of condition   PT Frequency 1x / week   PT Duration 12 weeks   PT Treatment/Interventions Aquatic Therapy;Electrical Stimulation;Iontophoresis 46m/ml Dexamethasone;Therapeutic activities;Therapeutic exercise;Neuromuscular re-education;Patient/family education;Manual techniques;Dry needling;Functional mobility training;Stair training;Moist Heat;Manual lymph drainage   PT Next Visit Plan trunk muscle and hip strengthening, lumbopelvic control, scapular strengthening, aquatic therapy, modalities PRN   Consulted and Agree with Plan of Care Patient      Patient will benefit from skilled therapeutic intervention in order to improve the following deficits and impairments:  Pain, Difficulty walking, Decreased strength, Increased fascial restricitons, Decreased range of motion, Decreased safety awareness, Decreased coordination, Postural dysfunction, Improper body mechanics, Decreased scar mobility  Visit Diagnosis: Radiculopathy, lumbar region  Chronic bilateral low back pain, with sciatica presence unspecified  Muscle weakness (generalized)  S/P lumbar fusion  Leg length discrepancy  Other lack of coordination  Spinal stenosis of lumbar region without neurogenic claudication     Problem List Patient Active Problem List   Diagnosis Date Noted  . Cholecystitis with cholelithiasis 02/27/2016  . Increased frequency of urination 01/27/2015  . History of night sweats 05/01/2014  . Dupuytren's contracture 07/18/2013  . Inverted nipple 04/18/2013  . Depression 04/23/2010  . Fibromyalgia 04/23/2010  . Hypercholesterolemia 04/23/2010  . Hypertension, benign 04/23/2010  . Hypothyroidism 04/23/2010  . Irritable colon 04/23/2010  . Osteoarthritis 04/23/2010    YJerl Mina,PT, DPT, E-RYT  03/11/2017, 10:10 AM  [place] [facility] [address] [address]Phone:  [phone]  Fax:  [phone] Name: Kendra KIMPELMRN: [id number] of Birth: [birth year]

## 2017-03-11 NOTE — Patient Instructions (Signed)
Feet muscles strengthening Red band  10 x 2 reps  Each foot x 2 day    Gas pedal pump   Big toe in  ( other foot on band, thigh crossed over shin)    Pinky toe out  ( other foot placed hip width apart )

## 2017-03-12 ENCOUNTER — Other Ambulatory Visit: Payer: Self-pay | Admitting: Orthopedic Surgery

## 2017-03-12 DIAGNOSIS — M810 Age-related osteoporosis without current pathological fracture: Secondary | ICD-10-CM

## 2017-03-20 ENCOUNTER — Ambulatory Visit: Payer: Medicare Other | Admitting: Physical Therapy

## 2017-03-20 DIAGNOSIS — M2170 Unequal limb length (acquired), unspecified site: Secondary | ICD-10-CM

## 2017-03-20 DIAGNOSIS — Z981 Arthrodesis status: Secondary | ICD-10-CM

## 2017-03-20 DIAGNOSIS — G8929 Other chronic pain: Secondary | ICD-10-CM

## 2017-03-20 DIAGNOSIS — M48061 Spinal stenosis, lumbar region without neurogenic claudication: Secondary | ICD-10-CM

## 2017-03-20 DIAGNOSIS — M5416 Radiculopathy, lumbar region: Secondary | ICD-10-CM

## 2017-03-20 DIAGNOSIS — M6281 Muscle weakness (generalized): Secondary | ICD-10-CM

## 2017-03-20 DIAGNOSIS — M545 Low back pain: Secondary | ICD-10-CM

## 2017-03-20 DIAGNOSIS — R278 Other lack of coordination: Secondary | ICD-10-CM

## 2017-03-20 NOTE — Patient Instructions (Signed)
Keep up with specific stretches   Deep core level 1 ( with inhale, exhale, then quick pelvic floor squeeze)  Progress to level 3 ( deep core- instead of knee out, lift foot like marching)  6 min     Chair yoga:  With both hands on chair, shoulders rolled back  Warrior III , feet against wall pelvis levelled      Half warrior with Left foot forward ( hip width)   R hand is up      Half warrior with R foot forward, then L hand cross on to thigh for twist.  To get out of it  inhale tall, exhale turn  back to center,. Then hands on chair ro step forward  ( to not lose balance)      Doorway/ counter :   stepping forward / back ward lunges  EYE BALL THAT YOUR FOOT IS HIP WIDTH WHEN STEPPING AND LANDING

## 2017-03-21 NOTE — Therapy (Signed)
[place] MAIN [facility] SERVICES [place], [place], [address] Phone: [phone]   Fax:  [phone]  Physical Therapy Treatment  Patient Details  Name: Kendra Benton MRN: [id number] Date of Birth: 1940-08-12 Referring Provider: Almedia Balls, MD  Encounter Date: 03/20/2017      PT End of Session - 03/20/17 1130    Visit Number 24   Date for PT Re-Evaluation 04/28/17   Authorization Type 8   Authorization Time Period of 10 g-code   PT Start Time 1115   PT Stop Time 1200   PT Time Calculation (min) 45 min   Activity Tolerance Patient tolerated treatment well   Behavior During Therapy [facility] for tasks assessed/performed      Past Medical History:  Diagnosis Date  . Allergy   . Hypertension   . Myocardial infarction ([place])    1997  . Osteoporosis     Past Surgical History:  Procedure Laterality Date  . ABDOMINAL HYSTERECTOMY     patient has ovaries  . APPENDECTOMY    . CHOLECYSTECTOMY N/A 02/28/2016   Procedure: LAPAROSCOPIC CHOLECYSTECTOMY WITH INTRAOPERATIVE CHOLANGIOGRAM;  Surgeon: Dia Crawford III, MD;  Location: [facility] ORS;  Service: General;  Laterality: N/A;  . LUMBAR FUSION  11/09/2016   L3-L5  . SPINE SURGERY     Disc    There were no vitals filed for this visit.      Subjective Assessment - 03/20/17 1120    Subjective Pt has had one week without the R groin pain nor back pain. Pt reported her husband has noticed her energy is better. Pt is able to go to the grocery store on her own and she is feeling like doing more activities. Pt went to the Basic Yoga class which included standing poses. Pt went soreness in her legs, but pain with the class. Pt feels that her exercises and the low dose of prednisone are helping here.  It has made the most enmornous difference.  Pt states this morning her R forearm hurts with a sharp, achey pain with  some  tingling in her fingers    Pertinent History R lumbar radiculopathy. Pt  states feeling pain R posterior hip and R lateral and inferior lateral knee. On bad days, pt feels ache in bilateral LE. Also still feels pain in her low back.  Since the surgery in 2016, pt fell and bruised a rib which did not seem to affect her back. Also picked up things that she should not have such as a large heavy cast iron pan last summer (2017) which kind of started something in her low back. Also has grandchildren in [place] and driving and riding there bothers her. Tries to get out of the car every hour. Also still does yoga which helps.   Takes gabapentin which makes her dizzy and affects her balance. Pt states that she feels like it helps but not totally.  No falls within the last 6 months.  States having diarrhea fairly frequently and sometimes comes out without knowing about it. Kind of has an idea which days it might occur.  Always had stomach problems since being very little but getting worse. Her PCP knows about it.  Denies saddle anesthesia.   Pt also states having L shoulder problems periodically within the last 2 months.     Patient Stated Goals To be able to walk and stand longer.    Pain Onset More than a month ago  [facility] PT Assessment - 03/20/17 1131      Observation/Other Assessments   Observations less toe adduction, increased stability but LOB with transitional movements between single limb stance. more stability with UE support      Functional Tests   Functional tests --  cued for tandem stance for more propioception        Assessed pt's report of acute forearm pain/ fingertips tingling:  Shoulder elevation, flexion, abduction, adduction, pronation, extension B 4/5 Wrist flexion R 3+/5, L 4/5    Dermatome normal C2-T1  B              [facility] Adult PT Treatment/Exercise - 03/21/17 0000      Neuro Re-ed    Neuro Re-ed Details  see pt instructions      Exercises   Exercises --  see pt instructions                 PT Education -  03/20/17 1130    Education provided Yes   Education Details HEP   Person(s) Educated Patient   Methods Explanation;Demonstration;Tactile cues;Verbal cues;Handout   Comprehension Verbalized understanding;Returned demonstration             PT Long Term Goals - 03/06/17 0856      PT LONG TERM GOAL #1   Title Patient will improve her 6 minute walk distance to at least 1200 ft independent gait to promote mobility and ability to shop for groceries.    Baseline 6 minute walk: 1054 ft (10/29/2016); 1300 ft (12/03/2016)   Time 6   Period Weeks   Status Achieved     PT LONG TERM GOAL #2   Title Patient will improve her Modified Oswestry Low Back Pain Disability Questionnaire score by at least 12% as a demonstration of improved function.    Baseline 46% (10/29/2016); 30% (12/03/2016)   Time 6   Period Weeks   Status Achieved     PT LONG TERM GOAL #3   Title Patient will improve bilateral hip abduction strength by at least 1/2 MMT grade to promote ability to perform functional tasks with less pain.    Baseline hip abduction: 4-/5 R, 4/5 L (10/29/2016); 4/5 R, 4+/5 L   Time 6   Period Weeks   Status Achieved     PT LONG TERM GOAL #4   Title Patient will improve her Modified Oswestry Low Back Pain Disability Questionnaire score to at least 20% or less as a demonstration of improved function.    Baseline 30% (12/03/2016)   Time 4   Period Weeks   Status Partially Met     PT LONG TERM GOAL #5   Title Pt will report decreased difficulty with ascending and descending steps at home.    Baseline Difficulty with going down steps per pt subjective reports (12/03/2016)   Time 4   Period Weeks   Status On-going     Additional Long Term Goals   Additional Long Term Goals Yes     PT LONG TERM GOAL #6   Title Pt will report decreased fecal leakage episodes from daily to < 3 x week across one week in order to return t exercising in the pool   Time 12   Period Weeks   Status Achieved     PT  LONG TERM GOAL #7   Title Pt will decrease her score on PFDI from 56  % to < 51  % in order to restore pelvic floor function (  9/28: 38% )    Time 12   Period Weeks   Status Achieved     PT LONG TERM GOAL #8   Title Pt will report a Bristol Stool Type of 3-5 to indicate improved stool consistency and less diarrhea to particiapte in social events   Time 12   Period Weeks   Status Achieved     PT LONG TERM GOAL  #9   TITLE Pt will demo less toe adduction ( -20 deg on 5th digit B to 0 deg) and more pronation in stance / push off phase in gait in order to improve balance and less R lateral thigh and groin area.    Time 12   Period Weeks   Status On-going     PT LONG TERM GOAL  #10   TITLE Pt will demo no R scapular winging and increased R flank strength/ length in order to minimize R hip pain and to walk longer distances    Time 12   Period Weeks   Status New               Plan - 03/20/17 1131    Clinical Impression Statement Pt is progressing well with resolved R groin and back pain the past week with compliance to HEP and prednisone. . Pt is able to go to the grocery store on her own and she is feeling like doing more activities with more energy. Progressed pt to dynamic balance exercises to help pt transition to more upright , weight bearing yoga poses to help pt integrate into another yoga community class which she enjoys. Pt demo'd no more R thigh mm tensions, pelvic obliquties, L low back mm tensions. Pt remains compliant to scoliosis -specific HEP, ankle/ hip/ deep core strengthening, and was progressed to dynamic balance yoga postures. Pt demo'd less toe gripping with more abducted toes and weight bearing and less lumbar lordosis in yoga poses compared to prior sessions.    Pt was advised to call her PCP about her acute R forearm pain that is sharp and achey with some tingling in her fingers. Grossly assessed UE strength and dermatome. Pt showed weaker wrist flexion and  negative dermatomal signs. Pt reported she did not feel pain wrapping up her arm/ shoulders/ jaw and voiced being able to differentiate signs of cardiac arrest.   Pt continues to benefit from skilled PT.    Rehab Potential Good   Clinical Impairments Affecting Rehab Potential pain, chronicity of condition   PT Frequency 1x / week   PT Duration 12 weeks   PT Treatment/Interventions Aquatic Therapy;Electrical Stimulation;Iontophoresis 57m/ml Dexamethasone;Therapeutic activities;Therapeutic exercise;Neuromuscular re-education;Patient/family education;Manual techniques;Dry needling;Functional mobility training;Stair training;Moist Heat;Manual lymph drainage   PT Next Visit Plan trunk muscle and hip strengthening, lumbopelvic control, scapular strengthening, aquatic therapy, modalities PRN   Consulted and Agree with Plan of Care Patient      Patient will benefit from skilled therapeutic intervention in order to improve the following deficits and impairments:  Pain, Difficulty walking, Decreased strength, Increased fascial restricitons, Decreased range of motion, Decreased safety awareness, Decreased coordination, Postural dysfunction, Improper body mechanics, Decreased scar mobility  Visit Diagnosis: Radiculopathy, lumbar region  Chronic bilateral low back pain, with sciatica presence unspecified  Muscle weakness (generalized)  S/P lumbar fusion  Leg length discrepancy  Other lack of coordination  Spinal stenosis of lumbar region without neurogenic claudication     Problem List Patient Active Problem List   Diagnosis Date Noted  . Cholecystitis with cholelithiasis 02/27/2016  .  Increased frequency of urination 01/27/2015  . History of night sweats 05/01/2014  . Dupuytren's contracture 07/18/2013  . Inverted nipple 04/18/2013  . Depression 04/23/2010  . Fibromyalgia 04/23/2010  . Hypercholesterolemia 04/23/2010  . Hypertension, benign 04/23/2010  . Hypothyroidism 04/23/2010  .  Irritable colon 04/23/2010  . Osteoarthritis 04/23/2010    Jerl Mina ,PT, DPT, E-RYT  03/21/2017, 12:01 AM  [place] MAIN [facility] SERVICES [address] [place], [place], [address] Phone: [phone]   Fax:  [phone]  Name: Kendra Benton MRN: [id number] Date of Birth: 03-03-1941

## 2017-03-25 ENCOUNTER — Ambulatory Visit: Payer: Medicare Other | Admitting: Physical Therapy

## 2017-03-25 DIAGNOSIS — G8929 Other chronic pain: Secondary | ICD-10-CM

## 2017-03-25 DIAGNOSIS — M545 Low back pain: Secondary | ICD-10-CM

## 2017-03-25 DIAGNOSIS — Z981 Arthrodesis status: Secondary | ICD-10-CM

## 2017-03-25 DIAGNOSIS — M5416 Radiculopathy, lumbar region: Secondary | ICD-10-CM | POA: Diagnosis not present

## 2017-03-25 DIAGNOSIS — M2170 Unequal limb length (acquired), unspecified site: Secondary | ICD-10-CM

## 2017-03-25 DIAGNOSIS — R278 Other lack of coordination: Secondary | ICD-10-CM

## 2017-03-25 DIAGNOSIS — M48061 Spinal stenosis, lumbar region without neurogenic claudication: Secondary | ICD-10-CM

## 2017-03-25 DIAGNOSIS — M6281 Muscle weakness (generalized): Secondary | ICD-10-CM

## 2017-03-25 NOTE — Therapy (Signed)
[place] MAIN [facility] SERVICES [place], [place], [address] Phone: [phone]   Fax:  [phone]  Physical Therapy Treatment  Patient Details  Name: Kendra Benton MRN: [id number] Date of Birth: March 30, 1941 Referring Provider: Almedia Balls, MD  Encounter Date: 03/25/2017      PT End of Session - 03/25/17 1525    Visit Number 25   Date for PT Re-Evaluation 04/28/17   Authorization Type 9   Authorization Time Period of 10 g-code   PT Start Time 1003   PT Stop Time 1100   PT Time Calculation (min) 57 min   Activity Tolerance Patient tolerated treatment well   Behavior During Therapy [facility] for tasks assessed/performed      Past Medical History:  Diagnosis Date  . Allergy   . Hypertension   . Myocardial infarction ([place])    1997  . Osteoporosis     Past Surgical History:  Procedure Laterality Date  . ABDOMINAL HYSTERECTOMY     patient has ovaries  . APPENDECTOMY    . CHOLECYSTECTOMY N/A 02/28/2016   Procedure: LAPAROSCOPIC CHOLECYSTECTOMY WITH INTRAOPERATIVE CHOLANGIOGRAM;  Surgeon: Dia Crawford III, MD;  Location: [facility] ORS;  Service: General;  Laterality: N/A;  . LUMBAR FUSION  11/09/2016   L3-L5  . SPINE SURGERY     Disc    There were no vitals filed for this visit.      Subjective Assessment - 03/25/17 1014    Subjective Pt reported she went to her yoga class that has more standing poses and her teacher noticed her balance was better. Pt reports today she only feels a tiny but of pain lateral hip but it is only sore this morning  and does not make her limp like it just to.     Pertinent History R lumbar radiculopathy. Pt states feeling pain R posterior hip and R lateral and inferior lateral knee. On bad days, pt feels ache in bilateral LE. Also still feels pain in her low back.  Since the surgery in 2016, pt fell and bruised a rib which did not seem to affect her back. Also picked up things that she should not have such  as a large heavy cast iron pan last summer (2017) which kind of started something in her low back. Also has grandchildren in [place] and driving and riding there bothers her. Tries to get out of the car every hour. Also still does yoga which helps.   Takes gabapentin which makes her dizzy and affects her balance. Pt states that she feels like it helps but not totally.  No falls within the last 6 months.  States having diarrhea fairly frequently and sometimes comes out without knowing about it. Kind of has an idea which days it might occur.  Always had stomach problems since being very little but getting worse. Her PCP knows about it.  Denies saddle anesthesia.   Pt also states having L shoulder problems periodically within the last 2 months.     Patient Stated Goals To be able to walk and stand longer.    Pain Onset More than a month ago            [facility] PT Assessment - 03/25/17 1017      AROM   Overall AROM  --  hip ext on R 0deg, L ~20 deg in prone      Strength   Overall Strength Comments L psoas 3+/5, R 4/5,  R hip abd  3/5,  L 4/5     . hip ext L 4/5, R 2/5 limited in full hip ext ROM   scaption/opp hip ext B 3+/5                    Pelvic Floor Special Questions - 03/25/17 0001    Diastasis Recti no fingers separation                    PT Education - 03/25/17 1051    Education provided Yes   Education Details HEP   Person(s) Educated Patient   Methods Explanation;Demonstration;Tactile cues;Verbal cues;Handout   Comprehension Returned demonstration;Verbalized understanding             PT Long Term Goals - 03/25/17 1021      PT LONG TERM GOAL #1   Title Patient will improve her 6 minute walk distance to at least 1200 ft independent gait to promote mobility and ability to shop for groceries.    Baseline 6 minute walk: 1054 ft (10/29/2016); 1300 ft (12/03/2016)   Time 6   Period Weeks   Status Achieved     PT LONG TERM GOAL #2   Title Patient  will improve her Modified Oswestry Low Back Pain Disability Questionnaire score by at least 12% as a demonstration of improved function.    Baseline 46% (10/29/2016); 30% (12/03/2016)   Time 6   Period Weeks   Status Achieved     PT LONG TERM GOAL #3   Title Patient will improve bilateral hip abduction strength by at least 1/2 MMT grade to promote ability to perform functional tasks with less pain.    Baseline hip abduction: 4-/5 R, 4/5 L (10/29/2016); 4/5 R, 4+/5 L   Time 6   Period Weeks   Status Achieved     PT LONG TERM GOAL #4   Title Patient will improve her Modified Oswestry Low Back Pain Disability Questionnaire score to at least 20% or less as a demonstration of improved function.    Baseline 30% (12/03/2016)   Time 4   Period Weeks   Status Partially Met     PT LONG TERM GOAL #5   Title Pt will report decreased difficulty with ascending and descending steps at home.    Baseline Difficulty with going down steps per pt subjective reports (12/03/2016),  less difficulty with going down ( 10/17/)    Time 4   Period Weeks   Status Achieved     Additional Long Term Goals   Additional Long Term Goals Yes     PT LONG TERM GOAL #6   Title Pt will report decreased fecal leakage episodes from daily to < 3 x week across one week in order to return t exercising in the pool   Time 12   Period Weeks   Status Achieved     PT LONG TERM GOAL #7   Title Pt will decrease her score on PFDI from 56  % to < 51  % in order to restore pelvic floor function ( 9/28: 38% )    Time 12   Period Weeks   Status Achieved     PT LONG TERM GOAL #8   Title Pt will report a Bristol Stool Type of 3-5 to indicate improved stool consistency and less diarrhea to particiapte in social events   Time 12   Period Weeks   Status Achieved     PT LONG TERM GOAL  #9  TITLE Pt will demo less toe adduction ( -20 deg on 5th digit B to 0 deg) and more pronation in stance / push off phase in gait in order to improve  balance and less R lateral thigh and groin area.    Time 12   Period Weeks   Status Achieved     PT LONG TERM GOAL  #10   TITLE Pt will demo no R scapular winging and increased R flank strength/ length in order to minimize R hip pain and to walk longer distances    Time 12   Period Weeks   Status New     PT LONG TERM GOAL  #11   TITLE Pt will demo increased strength in R hip abd and hip ext  ( 3/5 to (4/5) and scaption/ opp hip ext B from 3/5 to 4/5 in order to progress to strengthening exercises with less risk for relapse of pain    Time 4   Period Weeks   Target Date 04/22/17               Plan - 03/25/17 1526    Clinical Impression Statement Pt continues to report minimal pain in her LBP and R hip. Pt showed signficiant improvement with lower kinetic chain stability with less adducted toes and toe gripping. Progressed pt to R hip extension stretching and glut strengthening due to R weakness and limited R hip extension. Pt will be on vacation for 2 weeks. Pt continues to benefit from skilled PT.    Rehab Potential Good   Clinical Impairments Affecting Rehab Potential pain, chronicity of condition   PT Frequency 1x / week   PT Duration 12 weeks   PT Treatment/Interventions Aquatic Therapy;Electrical Stimulation;Iontophoresis 59m/ml Dexamethasone;Therapeutic activities;Therapeutic exercise;Neuromuscular re-education;Patient/family education;Manual techniques;Dry needling;Functional mobility training;Stair training;Moist Heat;Manual lymph drainage   PT Next Visit Plan trunk muscle and hip strengthening, lumbopelvic control, scapular strengthening, aquatic therapy, modalities PRN   Consulted and Agree with Plan of Care Patient      Patient will benefit from skilled therapeutic intervention in order to improve the following deficits and impairments:  Pain, Difficulty walking, Decreased strength, Increased fascial restricitons, Decreased range of motion, Decreased safety awareness,  Decreased coordination, Postural dysfunction, Improper body mechanics, Decreased scar mobility  Visit Diagnosis: Radiculopathy, lumbar region  Chronic bilateral low back pain, with sciatica presence unspecified  Muscle weakness (generalized)  S/P lumbar fusion  Leg length discrepancy  Other lack of coordination  Spinal stenosis of lumbar region without neurogenic claudication     Problem List Patient Active Problem List   Diagnosis Date Noted  . Cholecystitis with cholelithiasis 02/27/2016  . Increased frequency of urination 01/27/2015  . History of night sweats 05/01/2014  . Dupuytren's contracture 07/18/2013  . Inverted nipple 04/18/2013  . Depression 04/23/2010  . Fibromyalgia 04/23/2010  . Hypercholesterolemia 04/23/2010  . Hypertension, benign 04/23/2010  . Hypothyroidism 04/23/2010  . Irritable colon 04/23/2010  . Osteoarthritis 04/23/2010    YJerl Mina,PT, DPT, E-RYT  03/25/2017, 3:58 PM  [place] [facility] [address] [address]Phone: [phone]  Fax:  [phone] Name: Kendra LALLIMRN: [id number] of Birth: [birth year]

## 2017-03-25 NOTE — Patient Instructions (Addendum)
Seated with red band at thigh:  Knee out 10 x 3 reps   EQUAL WEIGHT ON SITTING BONES  _______  Standing with hands on chair  Band at ankle   10 x 3 reps   DO NOT LOCK KNEE ON L LEG MORE WEIGHT ON ALL FOUR CORNERS OF FOOT   _____  Band at ankle 10 side step with R LE leading x 4 laps    _____ Without band Hands on wall  Toe forward _ step R foot back _ drop R heel down  _bend knee, heel is up _ drop heel, step forward again   L knee stays above L ankle the whole time    10 reps   ________  Deep core level 3 ( marching )   Continue with ankle band exercise

## 2017-03-30 ENCOUNTER — Encounter: Payer: Medicare Other | Admitting: Physical Therapy

## 2017-04-01 ENCOUNTER — Ambulatory Visit: Payer: Medicare Other | Admitting: Physical Therapy

## 2017-04-08 ENCOUNTER — Encounter: Payer: Medicare Other | Admitting: Physical Therapy

## 2017-04-20 ENCOUNTER — Ambulatory Visit: Payer: Medicare Other | Admitting: Physical Therapy

## 2017-04-22 ENCOUNTER — Ambulatory Visit
Admission: RE | Admit: 2017-04-22 | Discharge: 2017-04-22 | Disposition: A | Discharge status: Home / Self Care | Payer: Medicare Other | Source: Ambulatory Visit | Attending: Orthopedic Surgery | Admitting: Orthopedic Surgery

## 2017-04-22 DIAGNOSIS — M85852 Other specified disorders of bone density and structure, left thigh: Secondary | ICD-10-CM | POA: Insufficient documentation

## 2017-04-22 DIAGNOSIS — M810 Age-related osteoporosis without current pathological fracture: Secondary | ICD-10-CM | POA: Diagnosis not present

## 2017-04-24 ENCOUNTER — Ambulatory Visit: Payer: Medicare Other | Attending: Orthopedic Surgery | Admitting: Physical Therapy

## 2017-04-24 DIAGNOSIS — M48061 Spinal stenosis, lumbar region without neurogenic claudication: Secondary | ICD-10-CM

## 2017-04-24 DIAGNOSIS — Z981 Arthrodesis status: Secondary | ICD-10-CM | POA: Diagnosis present

## 2017-04-24 DIAGNOSIS — M6281 Muscle weakness (generalized): Secondary | ICD-10-CM | POA: Insufficient documentation

## 2017-04-24 DIAGNOSIS — M545 Low back pain: Secondary | ICD-10-CM | POA: Insufficient documentation

## 2017-04-24 DIAGNOSIS — G8929 Other chronic pain: Secondary | ICD-10-CM

## 2017-04-24 DIAGNOSIS — M2170 Unequal limb length (acquired), unspecified site: Secondary | ICD-10-CM | POA: Diagnosis present

## 2017-04-24 DIAGNOSIS — R278 Other lack of coordination: Secondary | ICD-10-CM | POA: Diagnosis present

## 2017-04-24 DIAGNOSIS — M5416 Radiculopathy, lumbar region: Secondary | ICD-10-CM | POA: Diagnosis not present

## 2017-04-24 NOTE — Therapy (Signed)
[place] MAIN [facility] SERVICES [address] [place], [place], [address] Phone: [phone]   Fax:  [phone]  Physical Therapy Treatment  Patient Details  Name: Kendra Benton MRN: [id number] Date of Birth: 1941/03/24 Referring Provider: Almedia Balls, MD   Encounter Date: 04/24/2017  PT End of Session - 04/24/17 1734    Visit Number  26    Date for PT Re-Evaluation  04/28/17    Authorization Type  10    Authorization Time Period  of 10 g-code    PT Start Time  1000    PT Stop Time  1035    PT Time Calculation (min)  35 min    Activity Tolerance  Patient tolerated treatment well    Behavior During Therapy  [facility] for tasks assessed/performed       Past Medical History:  Diagnosis Date  . Allergy   . Hypertension   . Myocardial infarction ([place])    1997  . Osteoporosis     Past Surgical History:  Procedure Laterality Date  . ABDOMINAL HYSTERECTOMY     patient has ovaries  . APPENDECTOMY    . LAPAROSCOPIC CHOLECYSTECTOMY WITH INTRAOPERATIVE CHOLANGIOGRAM N/A 02/28/2016   Performed by Jeanie Cooks, MD at [facility] ORS  . LUMBAR FUSION  11/09/2016   L3-L5  . SPINE SURGERY     Disc    There were no vitals filed for this visit.  Subjective Assessment - 04/24/17 1005    Subjective  Pt has not diarrhea episodes and started to not take her Immodium but has not let her GI doctor know. Pt's orthopedist has increased her Prednisone which has helped her pain. Pt has been painfree for the month past. Pt continues to do her exercises. Pt will be going on two road trips and is concerned about flare ups. Pt also reported she has been able to go up/down stairs without holding onto rail     Pertinent History  R lumbar radiculopathy. Pt states feeling pain R posterior hip and R lateral and inferior lateral knee. On bad days, pt feels ache in bilateral LE. Also still feels pain in her low back.  Since the surgery in 2016, pt fell and bruised a rib which  did not seem to affect her back. Also picked up things that she should not have such as a large heavy cast iron pan last summer (2017) which kind of started something in her low back. Also has grandchildren in [place] and driving and riding there bothers her. Tries to get out of the car every hour. Also still does yoga which helps.   Takes gabapentin which makes her dizzy and affects her balance. Pt states that she feels like it helps but not totally.  No falls within the last 6 months.  States having diarrhea fairly frequently and sometimes comes out without knowing about it. Kind of has an idea which days it might occur.  Always had stomach problems since being very little but getting worse. Her PCP knows about it.  Denies saddle anesthesia.   Pt also states having L shoulder problems periodically within the last 2 months.      Patient Stated Goals  To be able to walk and stand longer.     Pain Onset  More than a month ago         [facility] PT Assessment - 04/24/17 1008      Observation/Other Assessments   Observations  no adducted toes B  Other:   Other/ Comments  able to stand SLS to don shoes       Strength   Overall Strength Comments    R hip abd 4-/5,  L 4+/5     . hip ext B 4/5, R hip ext full ROM scaption/opp hip ext B 3+/5        Ambulation/Gait   Gait Comments  no antalgic gait , longer strides                  [facility] Adult PT Treatment/Exercise - 04/24/17 1008      Therapeutic Activites    Therapeutic Activities  -- fitted nonlatex shoe lift, reviewed HEP, modification to car                  PT Long Term Goals - 04/24/17 1735      PT LONG TERM GOAL #1   Title  Patient will improve her 6 minute walk distance to at least 1200 ft independent gait to promote mobility and ability to shop for groceries.     Baseline  6 minute walk: 1054 ft (10/29/2016); 1300 ft (12/03/2016)    Time  6    Period  Weeks    Status  Achieved      PT LONG TERM GOAL #2    Title  Patient will improve her Modified Oswestry Low Back Pain Disability Questionnaire score by at least 12% as a demonstration of improved function.     Baseline  46% (10/29/2016); 30% (12/03/2016)    Time  6    Period  Weeks    Status  Achieved      PT LONG TERM GOAL #3   Title  Patient will improve bilateral hip abduction strength by at least 1/2 MMT grade to promote ability to perform functional tasks with less pain.     Baseline  hip abduction: 4-/5 R, 4/5 L (10/29/2016); 4/5 R, 4+/5 L    Time  6    Period  Weeks    Status  Achieved      PT LONG TERM GOAL #4   Title  Patient will improve her Modified Oswestry Low Back Pain Disability Questionnaire score to at least 20% or less as a demonstration of improved function.     Baseline  30% (12/03/2016)    Time  4    Period  Weeks    Status  Partially Met      PT LONG TERM GOAL #5   Title  Pt will report decreased difficulty with ascending and descending steps at home.     Baseline  Difficulty with going down steps per pt subjective reports (12/03/2016),  less difficulty with going down ( 10/17/)     Time  4    Period  Weeks    Status  Achieved      PT LONG TERM GOAL #6   Title  Pt will report decreased fecal leakage episodes from daily to < 3 x week across one week in order to return t exercising in the pool    Time  12    Period  Weeks    Status  Achieved      PT LONG TERM GOAL #7   Title  Pt will decrease her score on PFDI from 56  % to < 51  % in order to restore pelvic floor function ( 9/28: 38% )     Time  12    Period  Weeks  Status  Achieved      PT LONG TERM GOAL #8   Title  Pt will report a Bristol Stool Type of 3-5 to indicate improved stool consistency and less diarrhea to particiapte in social events    Time  12    Period  Weeks    Status  Achieved      PT LONG TERM GOAL  #9   TITLE  Pt will demo less toe adduction ( -20 deg on 5th digit B to 0 deg) and more pronation in stance / push off phase in gait in  order to improve balance and less R lateral thigh and groin area.     Time  12    Period  Weeks    Status  Achieved      PT LONG TERM GOAL  #10   TITLE  Pt will demo no R scapular winging and increased R flank strength/ length in order to minimize R hip pain and to walk longer distances     Time  12    Period  Weeks    Status  On-going      PT LONG TERM GOAL  #11   TITLE  Pt will demo increased strength in R hip abd and hip ext  ( 3/5 to (4/5) and scaption/ opp hip ext B from 3/5 to 4/5 in order to progress to strengthening exercises with less risk for relapse of pain     Time  4    Period  Weeks      PT LONG TERM GOAL  #12   TITLE  Pt will be able to self-manage with flare -ups or have minima; pain in order to travel in the car out of state with family    Time  2    Period  Weeks    Status  On-going            Plan - 04/24/17 1006    Clinical Impression Statement  Pt has not diarrhea episodes and started to not take her Immodium but has not let her GI doctor know.  PT recommended that pt inform her GI doctor about medication changes. Pt voiced understanding.   Pt reported she has been painfree the past month with continuation of her exercises and prednisone shots and has been able to navigate stairs without holding to rails. Pt returned today demonstrating improved gait, less adducted toes, improved balance, and less mm imbalances. Provided car seat modifications to minimzie flare-ups with her upcoming road trips. Fitted pt with new show insoles and reviewed HEP.Marland Kitchen Pt continues to benefit from skilled PT but plan to d/c her at next visit if pt has no issues   Rehab Potential  Good    Clinical Impairments Affecting Rehab Potential  pain, chronicity of condition    PT Frequency  1x / week    PT Duration  12 weeks    PT Treatment/Interventions  Aquatic Therapy;Electrical Stimulation;Iontophoresis 47m/ml Dexamethasone;Therapeutic activities;Therapeutic exercise;Neuromuscular  re-education;Patient/family education;Manual techniques;Dry needling;Functional mobility training;Stair training;Moist Heat;Manual lymph drainage    PT Next Visit Plan  trunk muscle and hip strengthening, lumbopelvic control, scapular strengthening, aquatic therapy, modalities PRN    Consulted and Agree with Plan of Care  Patient       Patient will benefit from skilled therapeutic intervention in order to improve the following deficits and impairments:  Pain, Difficulty walking, Decreased strength, Increased fascial restricitons, Decreased range of motion, Decreased safety awareness, Decreased coordination, Postural dysfunction, Improper body mechanics, Decreased  scar mobility  Visit Diagnosis: No diagnosis found.   G-Codes - May 02, 2017 1736    Functional Assessment Tool Used (Outpatient Only)  clincal judgement    Functional Limitation  Mobility: Walking and moving around    Mobility: Walking and Moving Around Current Status [phone])  At least 1 percent but less than 20 percent impaired, limited or restricted    Mobility: Walking and Moving Around Goal Status [phone])  0 percent impaired, limited or restricted       Problem List Patient Active Problem List   Diagnosis Date Noted  . Cholecystitis with cholelithiasis 02/27/2016  . Increased frequency of urination 01/27/2015  . History of night sweats 05/01/2014  . Dupuytren's contracture 07/18/2013  . Inverted nipple 04/18/2013  . Depression 04/23/2010  . Fibromyalgia 04/23/2010  . Hypercholesterolemia 04/23/2010  . Hypertension, benign 04/23/2010  . Hypothyroidism 04/23/2010  . Irritable colon 04/23/2010  . Osteoarthritis 04/23/2010    Jerl Mina ,PT, DPT, E-RYT  2017-05-02, 5:37 PM  [place] MAIN [facility] SERVICES [address] [place], [place], [address] Phone: [phone]   Fax:  [phone]  Name: Kendra Benton MRN: [id number] Date of Birth: 1941/02/25

## 2017-05-06 ENCOUNTER — Ambulatory Visit: Payer: Medicare Other | Admitting: Physical Therapy

## 2017-05-06 DIAGNOSIS — G8929 Other chronic pain: Secondary | ICD-10-CM

## 2017-05-06 DIAGNOSIS — M5416 Radiculopathy, lumbar region: Secondary | ICD-10-CM | POA: Diagnosis not present

## 2017-05-06 DIAGNOSIS — M48061 Spinal stenosis, lumbar region without neurogenic claudication: Secondary | ICD-10-CM

## 2017-05-06 DIAGNOSIS — M545 Low back pain: Secondary | ICD-10-CM

## 2017-05-06 DIAGNOSIS — Z981 Arthrodesis status: Secondary | ICD-10-CM

## 2017-05-06 DIAGNOSIS — R278 Other lack of coordination: Secondary | ICD-10-CM

## 2017-05-06 DIAGNOSIS — M6281 Muscle weakness (generalized): Secondary | ICD-10-CM

## 2017-05-06 DIAGNOSIS — M2170 Unequal limb length (acquired), unspecified site: Secondary | ICD-10-CM

## 2017-05-06 NOTE — Patient Instructions (Signed)
[email or web address]  Emergency set-up on cell phone: heart icon "medical ID" with asterick * symbol  on the bottom right corner Press Edit on the top Right corner  Scroll down to enter info and emergency contacts   _______      Warrior II:    Ski tracks, step back with toes pointing forward , then back heel down while lifting front foot and turn at ~30 deg. Sweep more weight towards [place] inner arch to not claw toes   Make sure 50% weight is in the front foot/leg , 50% weight is the back foot/ leg  Look down at  [place] and m ake sure it is aligned with front toes   3 breaths here.

## 2017-05-06 NOTE — Therapy (Addendum)
[place] MAIN [facility] SERVICES [address] [place], [place], [address] Phone: [phone]   Fax:  [phone]  Physical Therapy Treatment / Discharge Note  Patient Details  Name: Kendra Benton MRN: [id number] Date of Birth: 1940/11/20 Referring Provider: Almedia Balls, MD   Encounter Date: 05/06/2017  PT End of Session - 05/06/17 1142    Visit Number  27    Date for PT Re-Evaluation  04/28/17    Authorization Type  11    PT Start Time  1010    PT Stop Time  1103    PT Time Calculation (min)  53 min    Activity Tolerance  Patient tolerated treatment well    Behavior During Therapy  [facility] for tasks assessed/performed       Past Medical History:  Diagnosis Date  . Allergy   . Hypertension   . Myocardial infarction ([place])    1997  . Osteoporosis     Past Surgical History:  Procedure Laterality Date  . ABDOMINAL HYSTERECTOMY     patient has ovaries  . APPENDECTOMY    . CHOLECYSTECTOMY N/A 02/28/2016   Procedure: LAPAROSCOPIC CHOLECYSTECTOMY WITH INTRAOPERATIVE CHOLANGIOGRAM;  Surgeon: Dia Crawford III, MD;  Location: [facility] ORS;  Service: General;  Laterality: N/A;  . LUMBAR FUSION  11/09/2016   L3-L5  . SPINE SURGERY     Disc    There were no vitals filed for this visit.  Subjective Assessment - 05/06/17 1144    Subjective  Pt reports it did not ake as long to get over pain from riding in a car from an out of state trip compared to previous trips. Pt drove some and rode in the back seat. Pt hurt for one day after her trip when driving her granddtr to visit her cousin. Pt had a question about a yoga pose from her class. Pt also reported her diarrhea episodes occur now and then.  Pt feels her cortisone shots have helped her in combination with her PT exercises and yoga.      Pertinent History  R lumbar radiculopathy. Pt states feeling pain R posterior hip and R lateral and inferior lateral knee. On bad days, pt feels ache in bilateral LE.  Also still feels pain in her low back.  Since the surgery in 2016, pt fell and bruised a rib which did not seem to affect her back. Also picked up things that she should not have such as a large heavy cast iron pan last summer (2017) which kind of started something in her low back. Also has grandchildren in [place] and driving and riding there bothers her. Tries to get out of the car every hour. Also still does yoga which helps.   Takes gabapentin which makes her dizzy and affects her balance. Pt states that she feels like it helps but not totally.  No falls within the last 6 months.  States having diarrhea fairly frequently and sometimes comes out without knowing about it. Kind of has an idea which days it might occur.  Always had stomach problems since being very little but getting worse. Her PCP knows about it.  Denies saddle anesthesia.   Pt also states having L shoulder problems periodically within the last 2 months.      Patient Stated Goals  To be able to walk and stand longer.     Pain Onset  More than a month ago  Assessment: medially aligned toes B, no LOB with transition with standing yoga poses  Cued for proper sitting without crossed ankles. legs         [facility] Adult PT Treatment/Exercise - 05/06/17 1145      Therapeutic Activites    Therapeutic Activities  -- reviewed alignment of yoga pose (warrior II), discussed minizing yoga injuries,                  PT Long Term Goals - 05/06/17 1113      PT LONG TERM GOAL #1   Title  Patient will improve her 6 minute walk distance to at least 1200 ft independent gait to promote mobility and ability to shop for groceries.     Baseline  6 minute walk: 1054 ft (10/29/2016); 1300 ft (12/03/2016)    Time  6    Period  Weeks    Status  Achieved      PT LONG TERM GOAL #2   Title  Patient will improve her Modified Oswestry Low Back Pain Disability Questionnaire score by at least 12% as a demonstration of improved  function.     Baseline  46% (10/29/2016); 30% (12/03/2016)    Time  6    Period  Weeks    Status  Achieved      PT LONG TERM GOAL #3   Title  Patient will improve bilateral hip abduction strength by at least 1/2 MMT grade to promote ability to perform functional tasks with less pain.     Baseline  hip abduction: 4-/5 R, 4/5 L (10/29/2016); 4/5 R, 4+/5 L    Time  6    Period  Weeks    Status  Achieved      PT LONG TERM GOAL #4   Title  Patient will improve her Modified Oswestry Low Back Pain Disability Questionnaire score to at least 20% or less as a demonstration of improved function.     Baseline  30% (12/03/2016), 22% ( 05/06/17)     Time  4    Period  Weeks    Status  Achieved      PT LONG TERM GOAL #5   Title  Pt will report decreased difficulty with ascending and descending steps at home.     Baseline  Difficulty with going down steps per pt subjective reports (12/03/2016),  less difficulty with going down ( 10/17/)     Time  4    Period  Weeks    Status  Achieved      PT LONG TERM GOAL #6   Title  Pt will report decreased fecal leakage episodes from daily to < 3 x week across one week in order to return t exercising in the pool    Time  12    Period  Weeks    Status  Achieved      PT LONG TERM GOAL #7   Title  Pt will decrease her score on PFDI from 56  % to < 51  % in order to restore pelvic floor function ( 9/28: 38% )     Time  12    Period  Weeks    Status  Achieved      PT LONG TERM GOAL #8   Title  Pt will report a Bristol Stool Type of 3-5 to indicate improved stool consistency and less diarrhea to particiapte in social events    Time  12    Period  Weeks  Status  Achieved      PT LONG TERM GOAL  #9   TITLE  Pt will demo less toe adduction ( -20 deg on 5th digit B to 0 deg) and more pronation in stance / push off phase in gait in order to improve balance and less R lateral thigh and groin area.     Time  12    Period  Weeks    Status  Achieved      PT LONG  TERM GOAL  #10   TITLE  Pt will demo no R scapular winging and increased R flank strength/ length in order to minimize R hip pain and to walk longer distances     Time  12    Period  Weeks    Status  Achieved      PT LONG TERM GOAL  #11   TITLE  Pt will demo increased strength in R hip abd and hip ext  ( 3/5 to (4/5) and scaption/ opp hip ext B from 3/5 to 4/5 in order to progress to strengthening exercises with less risk for relapse of pain  ( 11/28: 4+/5 B scaption/ opp hip ext and hip abduction)      Time  4    Period  Weeks      PT LONG TERM GOAL  #12   TITLE  Pt will be able to self-manage with flare -ups or have minima; pain in order to travel in the car out of state with family    Time  2    Period  Weeks    Status  Achieved            Plan - 05/06/17 1142    Clinical Impression Statement Pt has achieved 100% of her goals. Pt has benefitted from a combination of: _ pelvic health PT _ modifications to her yoga poses from her community classes _customized HEP for her scoliosis _shoe lift to correct her leg length difference _and cortisone shots as recommended by her physician.  Her improved  postural stability and pelvic floor function have enabled her to return to walking up and down stairs without pain and having less diarrhea episodes. She achieved improved dynamic and static balance, gait mechanics, and walking endurance.  Pt demonstrated a stronger deep core mm system, resolved diastasis recti and sciatica, decreased lumbar scar restrictions, increased spinal/ hip/ SIJ mobility, decreased pelvic floor mm tightness and improved pelvic floor coordination/ ROM.  Her feet instrinsic mm, hip mm, and posterior back/ posterior leg mm / coordination also improved.    Pt is IND with her HEP and is ready for d/c today. Pt reports her Sx have improved a "Very Great Deal Better" based on the GROC scale. Thank you for your referral!      Rehab Potential  Good    Clinical Impairments  Affecting Rehab Potential  pain, chronicity of condition    PT Frequency  1x / week    PT Duration  12 weeks    PT Treatment/Interventions  Aquatic Therapy;Electrical Stimulation;Iontophoresis 4mg /ml Dexamethasone;Therapeutic activities;Therapeutic exercise;Neuromuscular re-education;Patient/family education;Manual techniques;Dry needling;Functional mobility training;Stair training;Moist Heat;Manual lymph drainage    PT Next Visit Plan  trunk muscle and hip strengthening, lumbopelvic control, scapular strengthening, aquatic therapy, modalities PRN    Consulted and Agree with Plan of Care  Patient       Patient will benefit from skilled therapeutic intervention in order to improve the following deficits and impairments:  Pain, Difficulty walking, Decreased strength, Increased fascial  restricitons, Decreased range of motion, Decreased safety awareness, Decreased coordination, Postural dysfunction, Improper body mechanics, Decreased scar mobility  Visit Diagnosis: Radiculopathy, lumbar region  Chronic bilateral low back pain, with sciatica presence unspecified  Muscle weakness (generalized)  S/P lumbar fusion  Leg length discrepancy  Other lack of coordination  Spinal stenosis of lumbar region without neurogenic claudication   G-Codes - 05-19-17 1159    Functional Assessment Tool Used (Outpatient Only)  MODI decreased from 46  to 22% , clinical judgement, improved strength and balance, and walking endurance    Functional Limitation  Mobility: Walking and moving around    Mobility: Walking and Moving Around Current Status [phone])  At least 1 percent but less than 20 percent impaired, limited or restricted    Mobility: Walking and Moving Around Goal Status [phone])  At least 1 percent but less than 20 percent impaired, limited or restricted    Mobility: Walking and Moving Around Discharge Status [phone])  At least 1 percent but less than 20 percent impaired, limited or restricted        Problem List Patient Active Problem List   Diagnosis Date Noted  . Cholecystitis with cholelithiasis 02/27/2016  . Increased frequency of urination 01/27/2015  . History of night sweats 05/01/2014  . Dupuytren's contracture 07/18/2013  . Inverted nipple 04/18/2013  . Depression 04/23/2010  . Fibromyalgia 04/23/2010  . Hypercholesterolemia 04/23/2010  . Hypertension, benign 04/23/2010  . Hypothyroidism 04/23/2010  . Irritable colon 04/23/2010  . Osteoarthritis 04/23/2010    Jerl Mina ,PT, DPT, E-RYT  05/19/17, 12:01 PM  [place] MAIN [facility] SERVICES [address] [place], [place], [address] Phone: [phone]   Fax:  [phone]  Name: Kendra Benton MRN: [id number] Date of Birth: [birth year]

## 2017-06-11 ENCOUNTER — Emergency Department
Admission: EM | Admit: 2017-06-11 | Discharge: 2017-06-11 | Disposition: A | Discharge status: Home / Self Care | Payer: Medicare Other | Attending: Emergency Medicine | Admitting: Emergency Medicine

## 2017-06-11 ENCOUNTER — Other Ambulatory Visit: Payer: Self-pay

## 2017-06-11 ENCOUNTER — Emergency Department: Payer: Medicare Other

## 2017-06-11 DIAGNOSIS — I10 Essential (primary) hypertension: Secondary | ICD-10-CM | POA: Insufficient documentation

## 2017-06-11 DIAGNOSIS — E039 Hypothyroidism, unspecified: Secondary | ICD-10-CM | POA: Insufficient documentation

## 2017-06-11 DIAGNOSIS — Z91040 Latex allergy status: Secondary | ICD-10-CM | POA: Diagnosis not present

## 2017-06-11 DIAGNOSIS — R079 Chest pain, unspecified: Secondary | ICD-10-CM | POA: Insufficient documentation

## 2017-06-11 DIAGNOSIS — Z79899 Other long term (current) drug therapy: Secondary | ICD-10-CM | POA: Diagnosis not present

## 2017-06-11 DIAGNOSIS — Z87891 Personal history of nicotine dependence: Secondary | ICD-10-CM | POA: Diagnosis not present

## 2017-06-11 DIAGNOSIS — I252 Old myocardial infarction: Secondary | ICD-10-CM | POA: Insufficient documentation

## 2017-06-11 LAB — BASIC METABOLIC PANEL
ANION GAP: 14 (ref 5–15)
BUN: 26 mg/dL — ABNORMAL HIGH (ref 6–20)
CALCIUM: 9.8 mg/dL (ref 8.9–10.3)
CO2: 21 mmol/L — ABNORMAL LOW (ref 22–32)
Chloride: 98 mmol/L — ABNORMAL LOW (ref 101–111)
Creatinine, Ser: 0.89 mg/dL (ref 0.44–1.00)
GLUCOSE: 119 mg/dL — AB (ref 65–99)
Potassium: 3.4 mmol/L — ABNORMAL LOW (ref 3.5–5.1)
Sodium: 133 mmol/L — ABNORMAL LOW (ref 135–145)

## 2017-06-11 LAB — CBC
HCT: 44.3 % (ref 35.0–47.0)
HEMOGLOBIN: 14.9 g/dL (ref 12.0–16.0)
MCH: 33.4 pg (ref 26.0–34.0)
MCHC: 33.6 g/dL (ref 32.0–36.0)
MCV: 99.5 fL (ref 80.0–100.0)
Platelets: 367 10*3/uL (ref 150–440)
RBC: 4.45 MIL/uL (ref 3.80–5.20)
RDW: 14.4 % (ref 11.5–14.5)
WBC: 10.2 10*3/uL (ref 3.6–11.0)

## 2017-06-11 LAB — TROPONIN I

## 2017-06-11 NOTE — ED Triage Notes (Signed)
Pt arrives to ED c/o of central CP that radiates into both sides of jaw. Also states HA x 1 month. States "severe" CP began today but has had intermittent CP over past few days. "I've also been having some stomach aches." pt appears anxious. Alert, oriented.

## 2017-06-11 NOTE — Discharge Instructions (Signed)
You are evaluated for chest pain, and although no certain cause was found, your exam and evaluation are overall reassuring in the emergency room today.  We discussed, I do recommend he follow-up closely with a cardiologist and office number is provided.  Return to emerge department immediately for any new or worsening condition including chest pain, nausea, sweats, dizziness passing out, or any other symptoms concerning to you.

## 2017-06-11 NOTE — ED Provider Notes (Signed)
[facility] Emergency Department Provider Note ____________________________________________   I have reviewed the triage vital signs and the triage nursing note.  HISTORY  Chief Complaint Chest Pain   Historian Patient  HPI Kendra Benton is a 77 y.o. female with a history of "mild "heart attack many years ago, and a history of chronic back pain due to surgeries, and a history of anxiety that gives her chest pain at times, presents due to a few intermittent episodes of central tight chest pressure yesterday as well as an episode today which lasted longer than typical at least 20 minutes and was very tight of a like a spasm in the center of her chest.  No nausea or sweats or dizziness.  It was severe enough that she called her husband from the grocery and he came to pick her up and brought her straight to the emergency department.  Chest pain currently now gone although she feels a little bit of tightness.  Denies GERD-like symptoms.  She does take omeprazole.  Husband points out that she did get bad news of a death, and is suspicious that this may be what is provoking symptoms.  She does not take medication for anxiety, states that normally deep breathing can take care of it.  She has not seen a cardiologist or had a stress test anytime recently.   Past Medical History:  Diagnosis Date  . Allergy   . Hypertension   . Myocardial infarction ([place])    1997  . Osteoporosis     Patient Active Problem List   Diagnosis Date Noted  . Cholecystitis with cholelithiasis 02/27/2016  . Increased frequency of urination 01/27/2015  . History of night sweats 05/01/2014  . Dupuytren's contracture 07/18/2013  . Inverted nipple 04/18/2013  . Depression 04/23/2010  . Fibromyalgia 04/23/2010  . Hypercholesterolemia 04/23/2010  . Hypertension, benign 04/23/2010  . Hypothyroidism 04/23/2010  . Irritable colon 04/23/2010  . Osteoarthritis 04/23/2010    Past Surgical  History:  Procedure Laterality Date  . ABDOMINAL HYSTERECTOMY     patient has ovaries  . APPENDECTOMY    . CHOLECYSTECTOMY N/A 02/28/2016   Procedure: LAPAROSCOPIC CHOLECYSTECTOMY WITH INTRAOPERATIVE CHOLANGIOGRAM;  Surgeon: Dia Crawford III, MD;  Location: [facility] ORS;  Service: General;  Laterality: N/A;  . LUMBAR FUSION  11/09/2016   L3-L5  . SPINE SURGERY     Disc    Prior to Admission medications   Medication Sig Start Date End Date Taking? Authorizing Provider  amLODipine (NORVASC) 5 MG tablet Take 1 tablet by mouth daily. 03/25/17   [provider]  amLODipine-benazepril (LOTREL) 5-10 MG per capsule Take 1 capsule by mouth every evening.     [provider]  dicyclomine (BENTYL) 10 MG capsule Take 10 mg by mouth 3 (three) times daily as needed. 08/17/14   [provider]  DULoxetine (CYMBALTA) 60 MG capsule Take 60 mg by mouth every evening.     [provider]  estradiol (ESTRACE) 0.5 MG tablet Take 2 tablets by mouth daily. 04/15/17   [provider]  gabapentin (NEURONTIN) 100 MG capsule Take 100 mg by mouth 3 (three) times daily.    [provider]  ketoconazole (NIZORAL) 2 % cream  01/03/15   [provider]  methocarbamol (ROBAXIN) 750 MG tablet Take 750 mg by mouth. 07/05/15   [provider]  neomycin-polymyxin b-dexamethasone (MAXITROL) 3.5-10000-0.1 OINT  04/23/15   [provider]  omeprazole (PRILOSEC) 20 MG capsule Take 20 mg by  mouth daily.    [provider]  oxyCODONE-acetaminophen (PERCOCET/ROXICET) 5-325 MG tablet Take 1-2 tablets by mouth every 6 (six) hours as needed for moderate pain. Patient not taking: Reported on 10/29/2016 02/29/16   Jeanie Cooks, MD  predniSONE (DELTASONE) 10 MG tablet Take 6 tablets together on day #1, then take 5 on day #2, then 4 on day #3, continue one fewer tablet each day 02/12/16   [provider]  temazepam (RESTORIL) 30 MG capsule Take 30 mg by  mouth at bedtime.     [provider]  triamterene-hydrochlorothiazide (DYAZIDE) 37.5-25 MG per capsule Take 1 capsule by mouth at bedtime.     [provider]    Allergies  Allergen Reactions  . Albuterol Palpitations  . Latex Rash    May be more adhesive allergy than latex.  Never tested    Family History  Problem Relation Age of Onset  . Heart disease Mother   . Arthritis Mother   . Cancer Mother 25       lymphoma  . Heart disease Father   . Cancer Father 62       prostate  . Cancer Sister 76       pancreatic    Social History Social History   Tobacco Use  . Smoking status: Former Smoker    Packs/day: 1.00    Years: 20.00    Pack years: 20.00    Types: Cigarettes  . Smokeless tobacco: Former Systems developer    Quit date: 10/10/1978  Substance Use Topics  . Alcohol use: Yes    Comment: 14 drinks per week  . Drug use: No    Review of Systems  Constitutional: Negative for fever. Eyes: Negative for visual changes. ENT: Negative for sore throat. Cardiovascular: Positive for chest pain. Respiratory: Negative for shortness of breath. Gastrointestinal: Negative for abdominal pain, vomiting and diarrhea. Genitourinary: Negative for dysuria. Musculoskeletal: Negative for back pain. Skin: Negative for rash. Neurological: Negative for headache.  ____________________________________________   PHYSICAL EXAM:  VITAL SIGNS: ED Triage Vitals  Enc Vitals Group     BP 06/11/17 1321 (!) 168/67     Pulse Rate 06/11/17 1321 72     Resp 06/11/17 1321 18     Temp 06/11/17 1321 97.7 F (36.5 C)     Temp Source 06/11/17 1321 Oral     SpO2 06/11/17 1321 94 %     Weight 06/11/17 1314 135 lb (61.2 kg)     Height 06/11/17 1314 5\' 2"  (1.575 m)     Head Circumference --      Peak Flow --      Pain Score 06/11/17 1314 6     Pain Loc --      Pain Edu? --      Excl. in [place]? --      Constitutional: Alert and oriented. Well appearing and in no distress. HEENT    Head: Normocephalic and atraumatic.      Eyes: Conjunctivae are normal. Pupils equal and round.       Ears:         Nose: No congestion/rhinnorhea.   Mouth/Throat: Mucous membranes are moist.   Neck: No stridor. Cardiovascular/Chest: Normal rate, regular rhythm.  No murmurs, rubs, or gallops. Respiratory: Normal respiratory effort without tachypnea nor retractions. Breath sounds are clear and equal bilaterally. No wheezes/rales/rhonchi. Gastrointestinal: Soft. No distention, no guarding, no rebound. Nontender.    Genitourinary/rectal:Deferred Musculoskeletal: Nontender with normal range of motion in all extremities. No  joint effusions.  No lower extremity tenderness.  No edema. Neurologic:  Normal speech and language. No gross or focal neurologic deficits are appreciated. Skin:  Skin is warm, dry and intact. No rash noted. Psychiatric: Mood and affect are normal. Speech and behavior are normal. Patient exhibits appropriate insight and judgment.   ____________________________________________  LABS (pertinent positives/negatives) I, Lisa Roca, MD the attending physician have reviewed the labs noted below.  Labs Reviewed  BASIC METABOLIC PANEL - Abnormal; Notable for the following components:      Result Value   Sodium 133 (*)    Potassium 3.4 (*)    Chloride 98 (*)    CO2 21 (*)    Glucose, Bld 119 (*)    BUN 26 (*)    All other components within normal limits  CBC  TROPONIN I  TROPONIN I    ____________________________________________    EKG I, Lisa Roca, MD, the attending physician have personally viewed and interpreted all ECGs.  77 bpm.  Normal sinus rhythm.  Right bundle branch block.  Likely left axis deviation.  Nonspecific ST and T wave ____________________________________________  RADIOLOGY All Xrays were viewed by me.  Imaging interpreted by Radiologist, and I, Lisa Roca, MD the attending physician have reviewed the radiologist interpretation  noted below.  Chest x-ray two-view:  IMPRESSION: Vague nodular density in the right apex likely representing a granuloma given findings in the left lung base. Non-contrast chest CT at 6-12 months is recommended. If the nodule is stable at time of repeat CT, then future CT at 18-24 months (from today's exam) is considered optional for low-risk patients, but is recommended for high-risk patients. This recommendation follows the consensus statement: Guidelines for Management of Incidental Pulmonary Nodules Detected on CT Images: From the Fleischner Society 2017; Radiology 2017; 284:228-243.  No other focal abnormality is noted. __________________________________________  PROCEDURES  Procedure(s) performed: None  Critical Care performed: None   ____________________________________________  ED COURSE / ASSESSMENT AND PLAN  Pertinent labs & imaging results that were available during my care of the patient were reviewed by me and considered in my medical decision making (see chart for details).    Patient had an episode of 20 minutes or so of severe central squeezing chest pain without associated symptoms.  Although it does remind her of prior episodes of stress/anxiety, last a little longer and was a little stronger.  She is currently using tobacco has been waiting here in the emergency department for evaluation for several hours.  EKG is overall reassuring.  I am going to send a repeat troponin given the first troponin was about an hour after the symptoms.  We discussed reassuring evaluation and exam, and I am recommending close cardiology follow-up.  It does seem most likely that the alternative diagnosis is anxiety, but given her age, and without cardiac evaluation I am recommending this.  DIFFERENTIAL DIAGNOSIS: Differential diagnosis includes, but is not limited to, ACS, aortic dissection, pulmonary embolism, cardiac tamponade, pneumothorax, pneumonia, pericarditis,  myocarditis, GI-related causes including esophagitis/gastritis, and musculoskeletal chest wall pain.    CONSULTATIONS: None  Patient / Family / Caregiver informed of clinical course, medical decision-making process, and agree with plan.   I discussed return precautions, follow-up instructions, and discharge instructions with patient and/or family.  Discharge Instructions : You are evaluated for chest pain, and although no certain cause was found, your exam and evaluation are overall reassuring in the emergency room today.  We discussed, I do recommend he follow-up closely with a  cardiologist and office number is provided.  Return to emerge department immediately for any new or worsening condition including chest pain, nausea, sweats, dizziness passing out, or any other symptoms concerning to you.    ___________________________________________   FINAL CLINICAL IMPRESSION(S) / ED DIAGNOSES   Final diagnoses:  Nonspecific chest pain      ___________________________________________        Note: This dictation was prepared with Dragon dictation. Any transcriptional errors that result from this process are unintentional    Lisa Roca, MD 06/11/17 2012

## 2017-06-15 DIAGNOSIS — E785 Hyperlipidemia, unspecified: Secondary | ICD-10-CM | POA: Insufficient documentation

## 2017-06-15 DIAGNOSIS — R0789 Other chest pain: Secondary | ICD-10-CM | POA: Insufficient documentation

## 2017-06-15 DIAGNOSIS — F419 Anxiety disorder, unspecified: Secondary | ICD-10-CM | POA: Insufficient documentation

## 2017-08-17 DIAGNOSIS — F410 Panic disorder [episodic paroxysmal anxiety] without agoraphobia: Secondary | ICD-10-CM | POA: Insufficient documentation

## 2017-08-17 DIAGNOSIS — G8929 Other chronic pain: Secondary | ICD-10-CM | POA: Insufficient documentation

## 2017-08-17 DIAGNOSIS — M545 Low back pain, unspecified: Secondary | ICD-10-CM | POA: Insufficient documentation

## 2017-09-06 IMAGING — CT CT ABD-PELV W/ CM
2 of 5 series · 16 of 46 positions shown, 18 images · IV contrast (APPLIED)
Comparison: None.

CLINICAL DATA: Chest and abdominal pain upon waking this morning.
Nausea.

EXAM:
CT ABDOMEN AND PELVIS WITH CONTRAST
TECHNIQUE: Multidetector CT imaging of the abdomen and pelvis was performed
using the standard protocol following bolus administration of
intravenous contrast.
CONTRAST:  100mL KYC0DG-ZKK IOPAMIDOL (KYC0DG-ZKK) INJECTION 61%

[Series 2: axial st · axial · 0.75mm/px · z∈[-469,-94]mm · 13 of 85 slices shown, 15 images]
[im 5/85  soft-tissue]
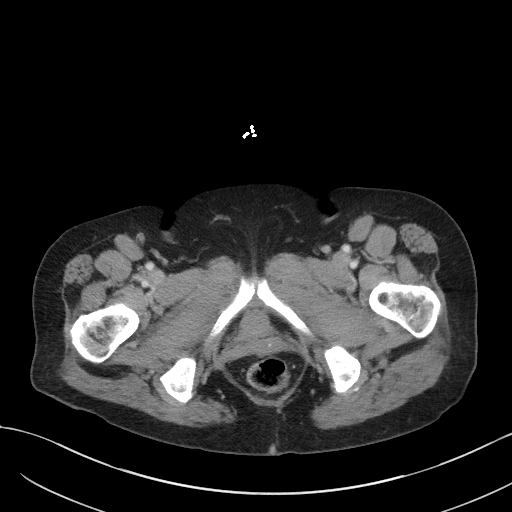
[im 5/85  bone]
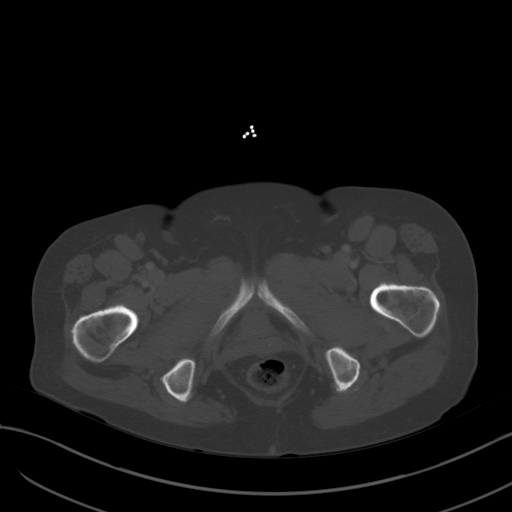
[im 14/85  soft-tissue]
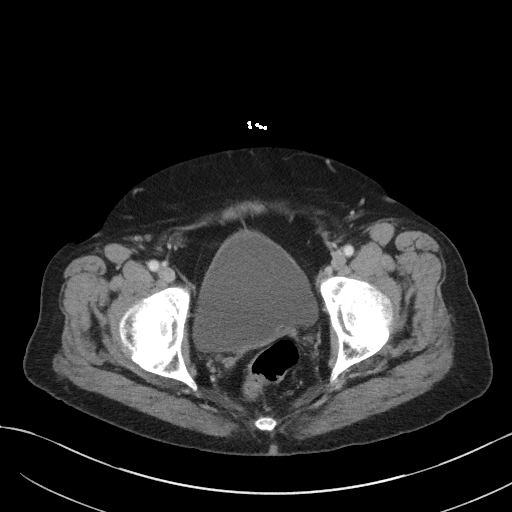
[im 18/85  soft-tissue]
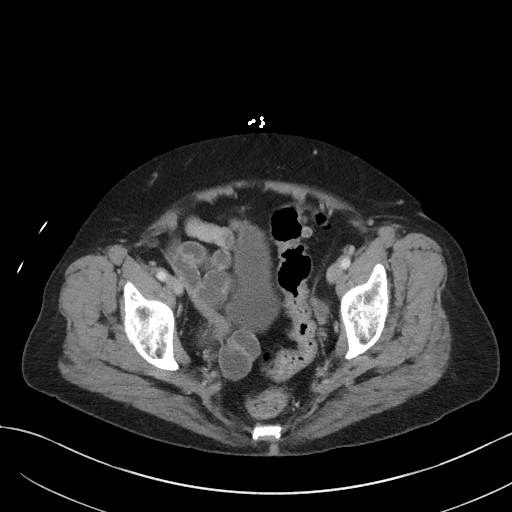
[im 23/85  soft-tissue]
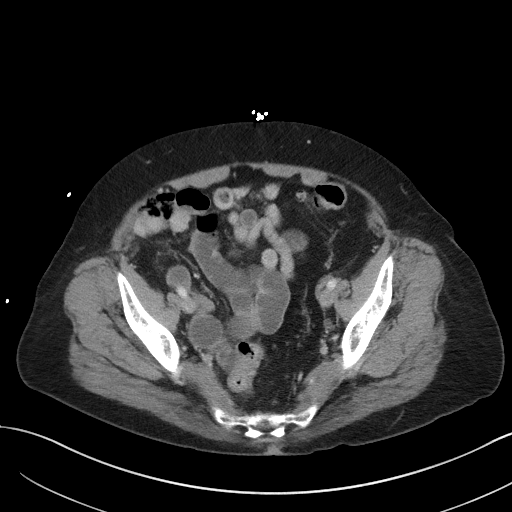
[im 31/85  soft-tissue]
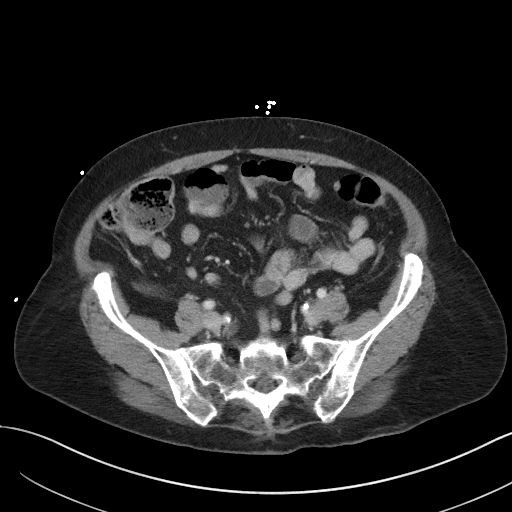
[im 36/85  soft-tissue]
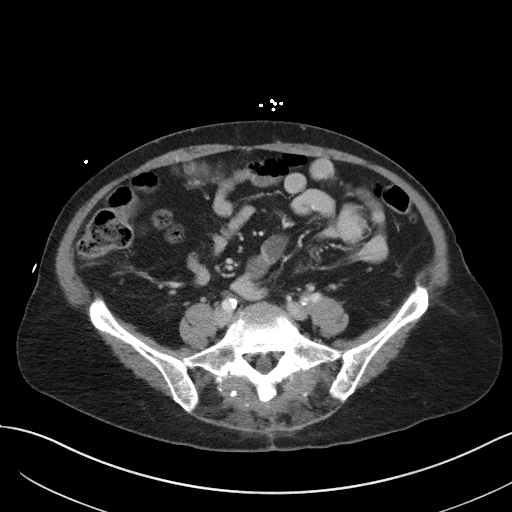
[im 45/85  soft-tissue]
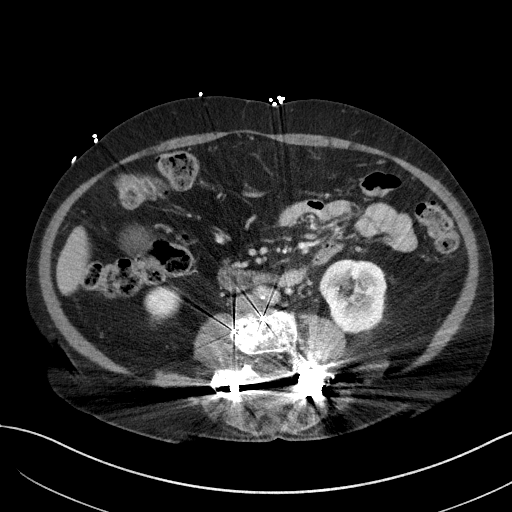
[im 49/85  soft-tissue]
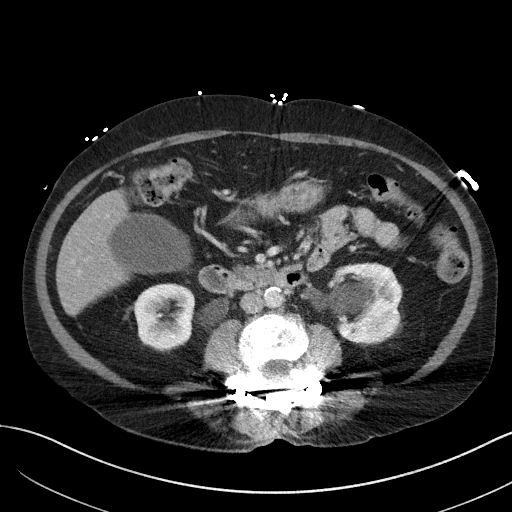
[im 54/85  soft-tissue]
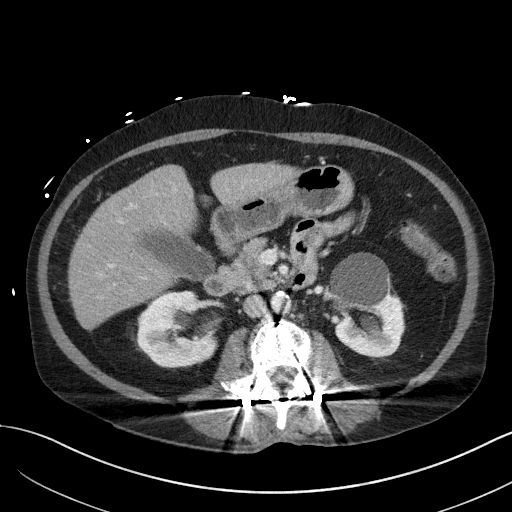
[im 54/85  bone]
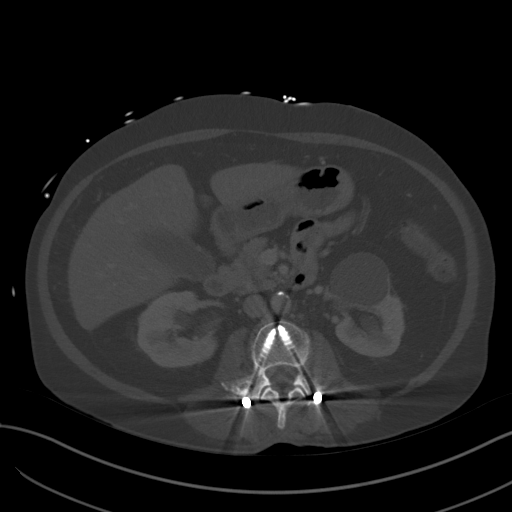
[im 62/85  soft-tissue]
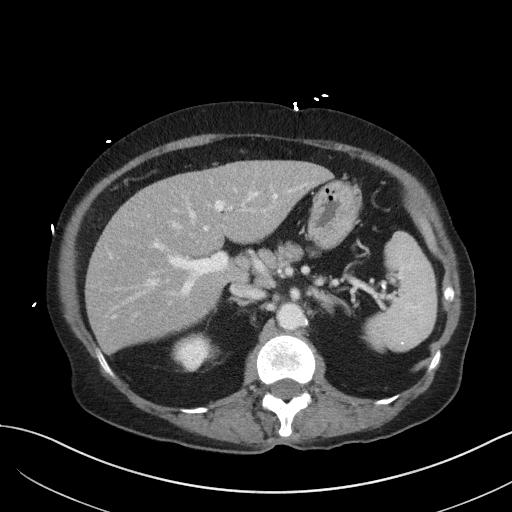
[im 67/85  soft-tissue]
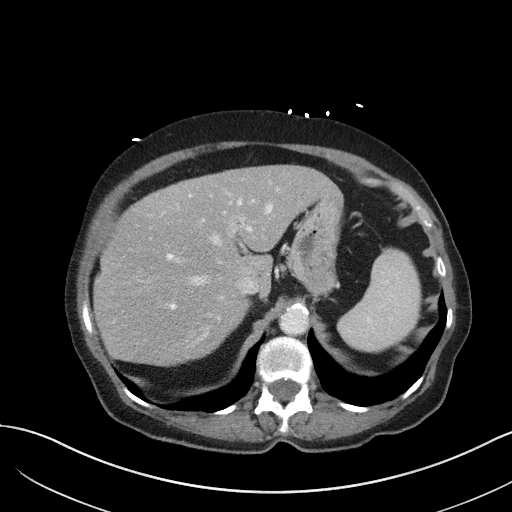
[im 71/85  soft-tissue]
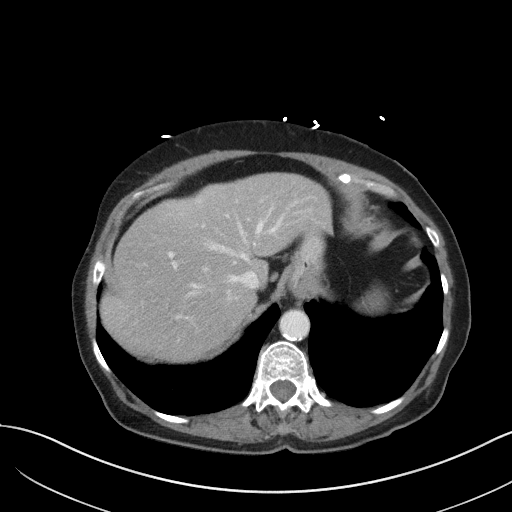
[im 80/85  soft-tissue]
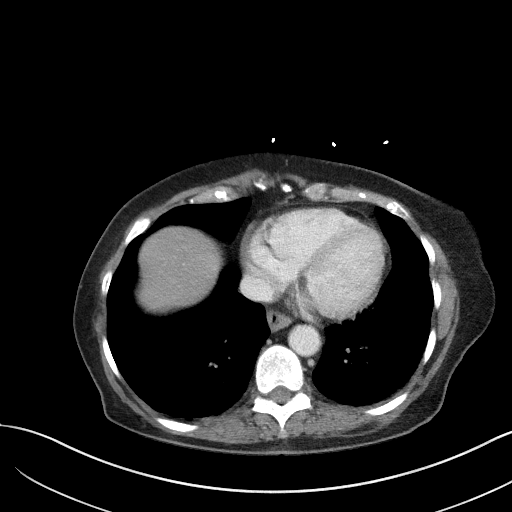

[Series 6: coronal st · coronal · 0.68mm/px · 3 of 90 slices shown]
[im 30/90  soft-tissue]
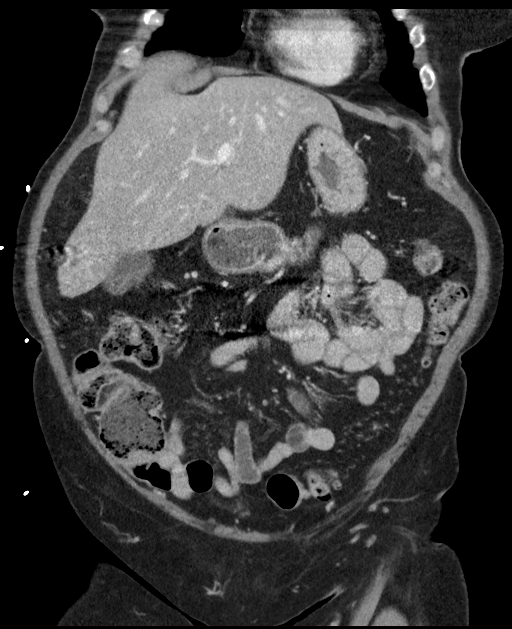
[im 40/90  soft-tissue]
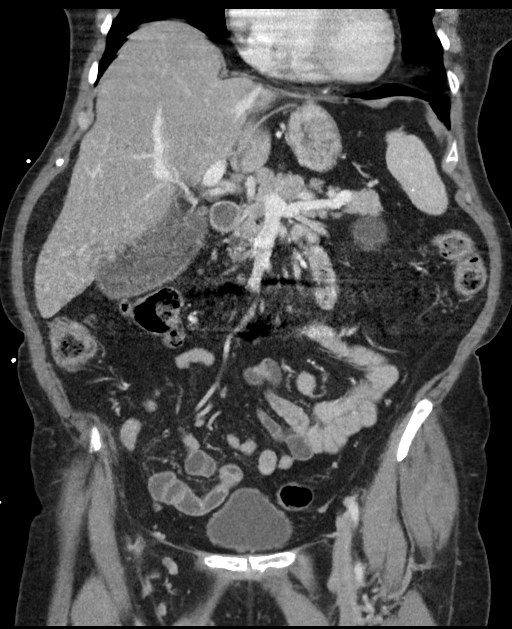
[im 50/90  soft-tissue]
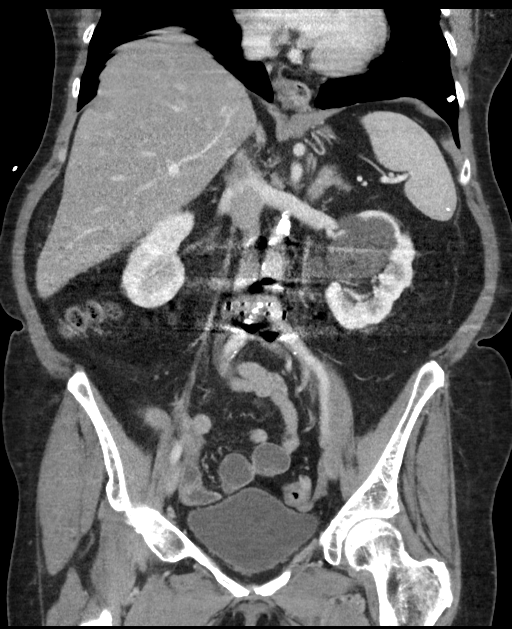

[16 of 46 positions shown; findings below may reference images not displayed]

FINDINGS: Lower chest: Lung bases show mild scarring. Chronic calcified
granuloma in the lateral lingula. No active chest process.

Hepatobiliary: Normal appearance of the liver. Gallbladder is
distended and there may be wall thickening. Suggest right upper
quadrant ultrasound assess for acute cholecystitis.

Pancreas: Normal

Spleen: Normal

Adrenals/Urinary Tract: Adrenal glands are normal. Right kidney is
normal. Left kidney is normal except for a benign cyst at the upper
pole measuring 4.9 cm in diameter.

Stomach/Bowel: No acute gastrointestinal finding. Previous
appendectomy. Diverticulosis without evidence of diverticulitis.

Vascular/Lymphatic: Aortic atherosclerosis. No aneurysm. The IVC is
normal. No retroperitoneal mass or lymphadenopathy.

Reproductive: Previous hysterectomy.  No pelvic mass.

Other: No ascites or free air.  No hernia.

Musculoskeletal: Previous decompression, diskectomy and fusion from
L3 through L5. Old compression deformity of L2 as seen previously.
IMPRESSION: The gallbladder is distended and questionably thick walled. Could
the patient have acute cholecystitis? Right upper quadrant
ultrasound could be performed to assess the gallbladder more
accurately if that is a clinical possibility.

Aortic atherosclerosis.

Benign appearing left renal cyst.

Chronic spinal findings.

## 2017-09-07 IMAGING — XA DG CHOLANGIOGRAM OPERATIVE
2 series · 8 of 8 positions shown · non-contrast
Comparison: Abdominal ultrasound 02/27/2016

CLINICAL DATA: 75-year-old female with cholelithiasis undergoing
laparoscopic cholecystectomy

EXAM:
INTRAOPERATIVE CHOLANGIOGRAM
TECHNIQUE: Cholangiographic images from the C-arm fluoroscopic device were
submitted for interpretation post-operatively. Please see the
procedural report for the amount of contrast and the fluoroscopy
time utilized.

[Series 2: cont. · 4 of 33 frames shown (1 of 2)]
[frame 3/33]
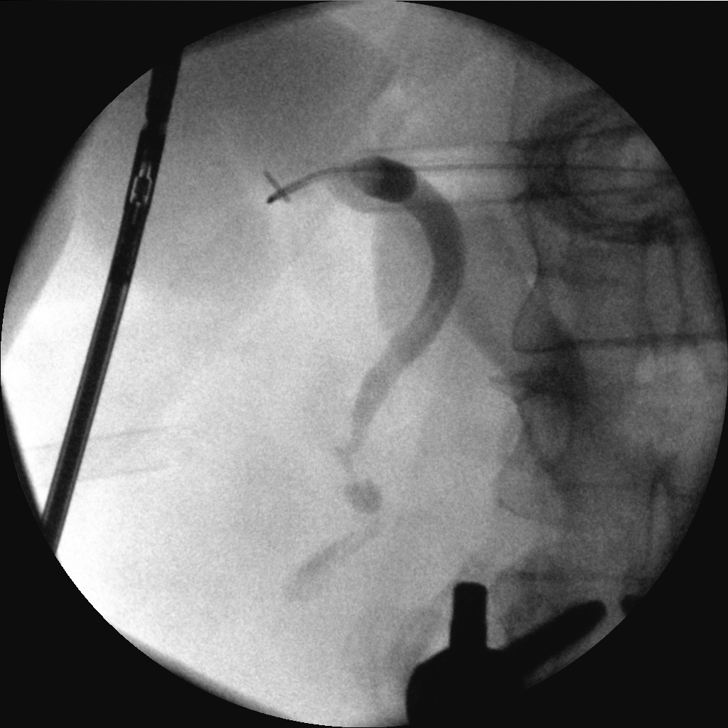
[frame 5/33]
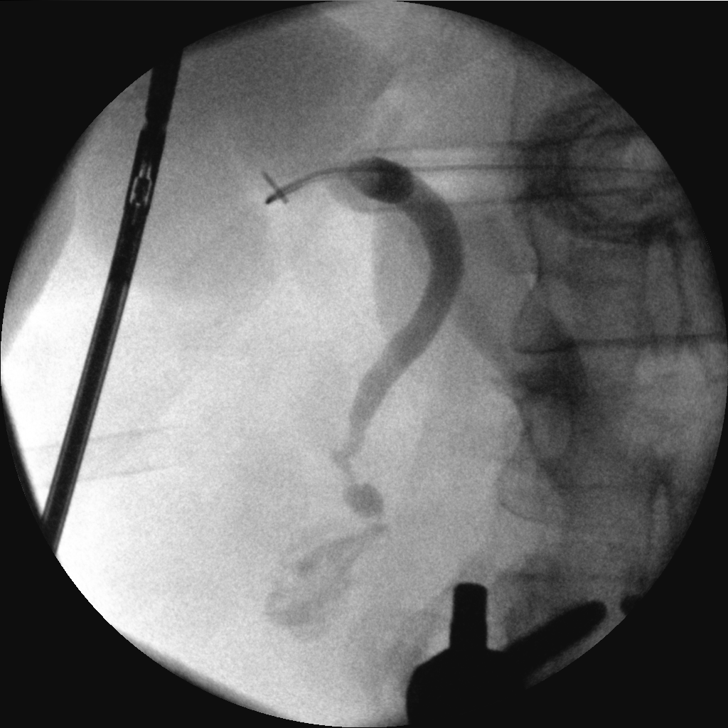
[frame 17/33]
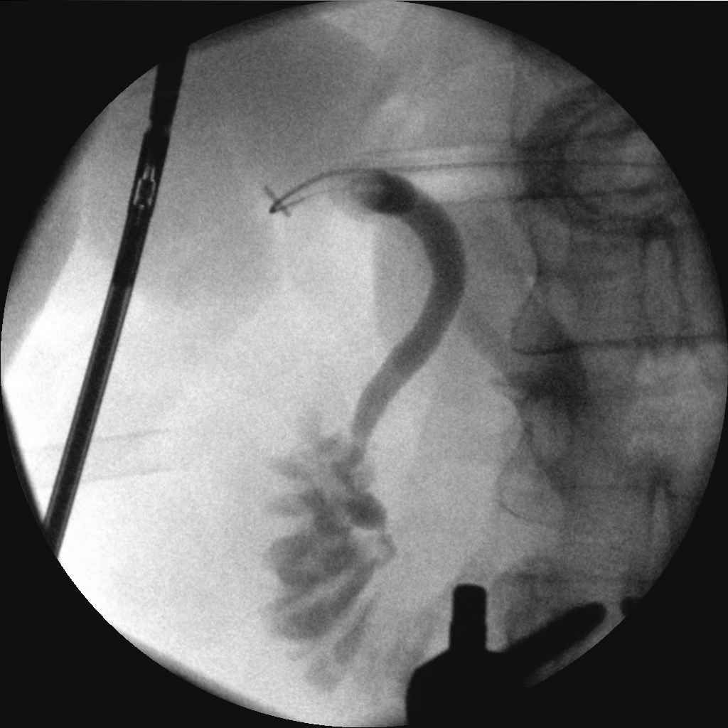
[frame 29/33]
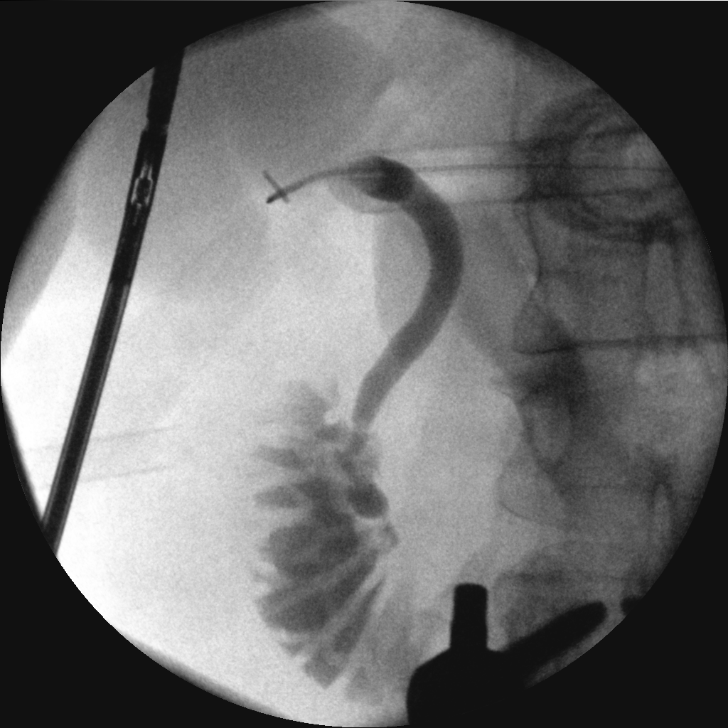

[Series 3: cont. · 4 of 44 frames shown (2 of 2)]
[frame 7/44]
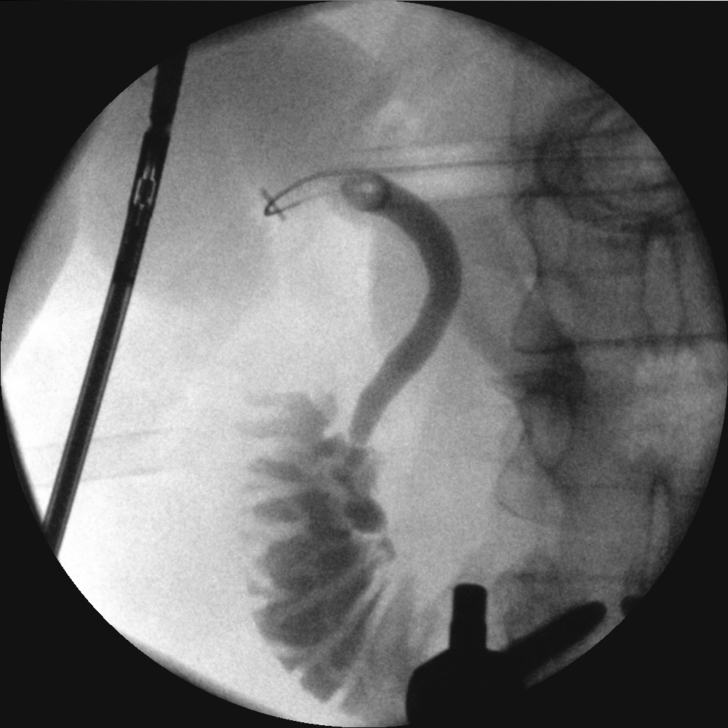
[frame 23/44]
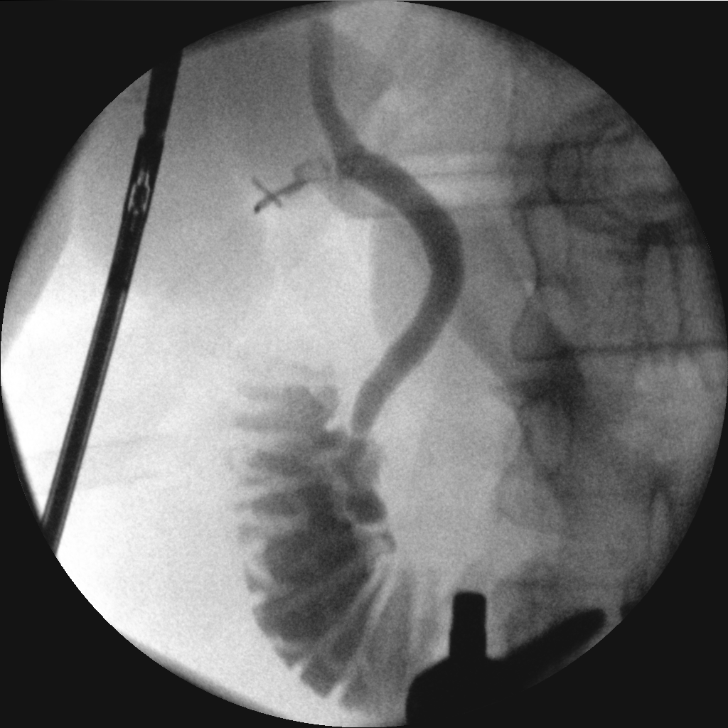
[frame 38/44]
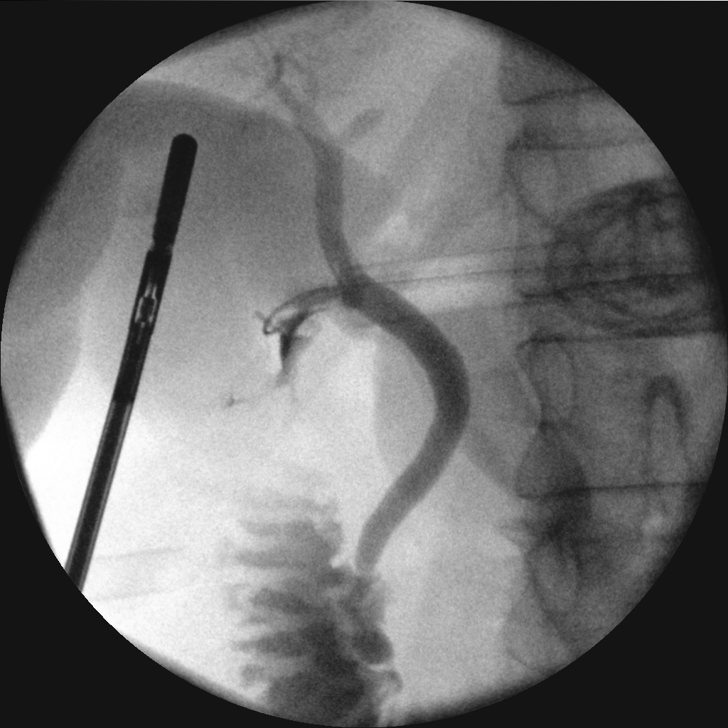
[frame 42/44]
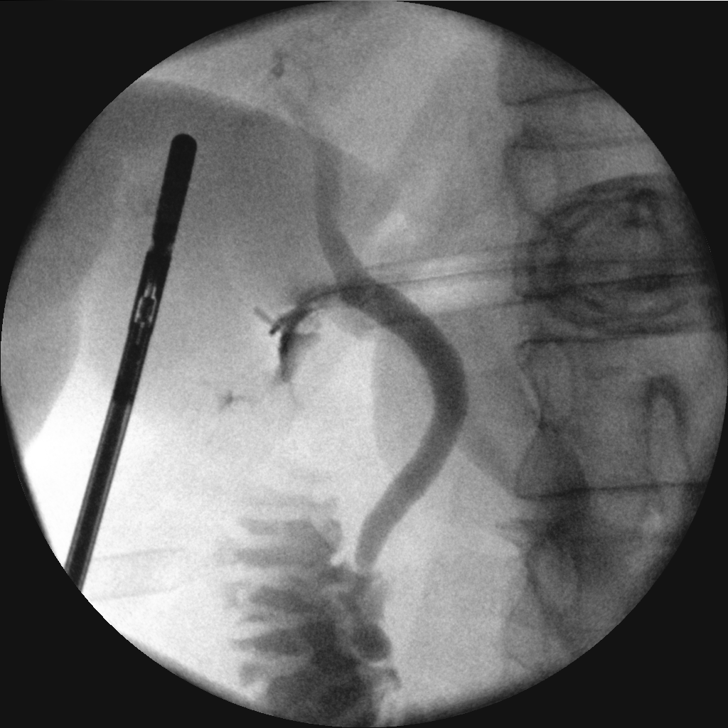

[8 of 8 positions shown; findings below may reference images not displayed]

FINDINGS: A cine clip and single saved radiographic image are submitted for
review. The images demonstrate cannulation of the cystic duct
remnant and opacification of the extrahepatic biliary tree. No
evidence of biliary ductal dilatation, stenosis, stricture or
filling defect to suggest choledocholithiasis. Contrast material
passes freely through the ampulla and into the duodenum.
IMPRESSION: Negative intraoperative cholangiogram.

## 2017-09-09 ENCOUNTER — Ambulatory Visit: Payer: Medicare Other | Attending: Internal Medicine | Admitting: Physical Therapy

## 2017-09-09 DIAGNOSIS — M5416 Radiculopathy, lumbar region: Secondary | ICD-10-CM | POA: Insufficient documentation

## 2017-09-09 DIAGNOSIS — M545 Low back pain, unspecified: Secondary | ICD-10-CM

## 2017-09-09 DIAGNOSIS — M6281 Muscle weakness (generalized): Secondary | ICD-10-CM | POA: Insufficient documentation

## 2017-09-09 DIAGNOSIS — M542 Cervicalgia: Secondary | ICD-10-CM | POA: Insufficient documentation

## 2017-09-09 DIAGNOSIS — M791 Myalgia, unspecified site: Secondary | ICD-10-CM | POA: Diagnosis not present

## 2017-09-09 DIAGNOSIS — M533 Sacrococcygeal disorders, not elsewhere classified: Secondary | ICD-10-CM | POA: Insufficient documentation

## 2017-09-09 DIAGNOSIS — G8929 Other chronic pain: Secondary | ICD-10-CM | POA: Diagnosis present

## 2017-09-11 NOTE — Addendum Note (Signed)
Addended by: Jerl Mina on: 09/11/2017 12:06 PM   Modules accepted: Orders

## 2017-09-11 NOTE — Patient Instructions (Signed)
   Standing:  10 reps on both sides x 3 x day     3 point tap   Feet are hip width Tap forward, center under hip not feet next to each other  Tap middle\, center  Tap back   ____  minisquats 10 reps x 2

## 2017-09-11 NOTE — Therapy (Addendum)
[place] MAIN [facility] SERVICES [address] [place], [place], [address] Phone: [phone]   Fax:  [phone]  Physical Therapy Evaluation  Patient Details  Name: Kendra Benton MRN: [id number] Date of Birth: 1941-01-03 Referring Provider: Sharia Reeve    Encounter Date: 09/09/2017  PT End of Session - 09/11/17 1143    Visit Number  [id number]    Date for PT Re-Evaluation  04/28/17    PT Start Time  1510    PT Stop Time  1615    PT Time Calculation (min)  65 min    Activity Tolerance  Patient tolerated treatment well    Behavior During Therapy  [facility] for tasks assessed/performed       Past Medical History:  Diagnosis Date  . Allergy   . Hypertension   . Myocardial infarction ([place])    1997  . Osteoporosis     Past Surgical History:  Procedure Laterality Date  . ABDOMINAL HYSTERECTOMY     patient has ovaries  . APPENDECTOMY    . CHOLECYSTECTOMY N/A 02/28/2016   Procedure: LAPAROSCOPIC CHOLECYSTECTOMY WITH INTRAOPERATIVE CHOLANGIOGRAM;  Surgeon: Dia Crawford III, MD;  Location: [facility] ORS;  Service: General;  Laterality: N/A;  . LUMBAR FUSION  11/09/2016   L3-L5  . SPINE SURGERY     Disc    There were no vitals filed for this visit.   Subjective Assessment - 09/11/17 1126    Subjective  1) CLBP:  Pt was feeling well with her low back pain around 05/2017 with PT.  In mid-Dec 2018, pt stepping off a curb and into a hole and fell completely onto her L side. Pt did okay for a few days after ice/Ibuprofen and saw her orthopedic MD. Her Xrays were neg for fractures.  The pain was a 6-7/10 and has worsened with standing , housework to a level of 8/10.  It radiates from the middle of the low back down B hips at the buttocks.  One month ago, pt tried 2 yoga classes but they they increased to an 8/10. This pain occured during the yoga class ( danasana) and after the class. No urinary / bowel chnages after this fall.  Fecal leakage is better since last PT  sessions last year.   2) B neck pain which occurred after the fall Dec 2018 and pt n oticed she turning, the neck noise like cracking/ popping.    When she turns her head left, it is uncomfortable.  Pain  4/10.  Denied radiating pain down the arms.      Pertinent History  Pt feels tired all the time. "I feel like I am living a half-live and I feel depressed."     Patient Stated Goals  to feel normal like she did after completing PT back in 04/2017. To be able to go to the grocery store by her self          [facility] PT Assessment - 09/11/17 1128      Assessment   Medical Diagnosis  Lumbar radiculopathy    Referring Provider  [place]       Precautions   Precautions  None      Balance Screen   Has the patient fallen in the past 6 months  No      Prior Function   Level of Independence  Independent      AROM   Overall AROM Comments  cervical: ext/flex 55 deg, sidebend: B 30 deg,  L 35 deg,  R 50 deg        Strength   Overall Strength Comments  seated: BLE 4/5 hip/knee flex/ext. BUE: flexion/ext,abd/add/ elb flex/ext 4-/5      Palpation   SI assessment   R iliac crest higher than L, limping in gait  (Post Tx: no limping, iliac crest aligned)      Palpation comment  hypomobility at R SIJ       Ambulation/Gait   Gait Comments  antalgic gait, decreased stance on L                 Objective measurements completed on examination: See above findings.      [place] Adult PT Treatment/Exercise - 09/11/17 1128      Exercises   Exercises  -- see pt instructions      Manual Therapy   Manual therapy comments  rotational mob, PA mob along SIJ              PT Education - 09/11/17 1139    Education provided  Yes    Education Details  HEP, POC, goals,     Person(s) Educated  Patient    Methods  Explanation;Demonstration;Tactile cues;Verbal cues    Comprehension  Returned demonstration;Verbalized understanding          PT Long Term Goals - 09/11/17 1148      PT  LONG TERM GOAL #1   Title  Pt will report being able to stand > 30 min without radiating pain nor < 2/10 pain at SIJ in order to stand and shop     Time  12    Period  Weeks    Status  New      PT LONG TERM GOAL #2   Title  Pt will be able to perform household chores with proper body mechanics     Time  6    Period  Weeks    Status  New      PT LONG TERM GOAL #3   Title  Pt will be able to modify yoga poses and perform classes across 1 month without pain     Time  12    Period  Weeks    Status  New      PT LONG TERM GOAL #4   Title  Pt will decrease his ODI score  42% to <32 % in order to restore QOL and ADLs    Time  8    Period  Weeks    Status  New      PT LONG TERM GOAL #5   Title  Pt will decrease his NDI score  34% to <24 % in order to restore QOL and ADLs    Time  8    Period  Weeks    Status  New      PT LONG TERM GOAL #6   Title  Pt will increase her Cervical ROM from sidebend B 30 deg > 45 deg, rotation L 30 deg to > 45 deg     Time  4    Period  Weeks    Status  New      PT LONG TERM GOAL #7   Title  Pt will increase her gait speed from 0.8 m/s to> 1.2 m/s in order to ambulate in the community safely    Time  -- 12    Period  Weeks    Status  New      PT LONG TERM GOAL #8  Title  Pt will demo no pelvic obliquties and normal SIJ mobility and gait across 2 visits in order to decrease pain and return to ADLs    Time  2    Period  Weeks    Status  New             Plan - 09/11/17 1148    Clinical Impression Statement  Pt is a 77 yo female who reports CLBP and B neck pain which impacts her ADLs and QOL. Pt had a fall when stepping off a sidewalk curb 4 months ago which is likely linked to the pelvic obliquity and gait deviations found upon assessment today. Pt showed decreased B UE weakness but no sensation deficits with no report of numbness /tingling. Following today's session, pt showed more equal iliac crest alignment and no antalgic gait. Plan to  address modification of yoga poses as her LBP have increased with certain poses.  Plan to address neck pain at upcoming session.    Clinical Presentation  Evolving    Clinical Decision Making  Moderate    Rehab Potential  Good    PT Frequency  1x / week    PT Duration  12 weeks    PT Treatment/Interventions  Aquatic Therapy;Electrical Stimulation;Iontophoresis 4mg /ml Dexamethasone;Therapeutic activities;Therapeutic exercise;Neuromuscular re-education;Patient/family education;Manual techniques;Dry needling;Functional mobility training;Stair training;Moist Heat;Manual lymph drainage;Gait training;Scar mobilization    Consulted and Agree with Plan of Care  Patient       Patient will benefit from skilled therapeutic intervention in order to improve the following deficits and impairments:  Pain, Difficulty walking, Decreased strength, Increased fascial restricitons, Decreased range of motion, Decreased safety awareness, Decreased coordination, Postural dysfunction, Improper body mechanics, Decreased scar mobility  Visit Diagnosis: Sacrococcygeal disorders, not elsewhere classified  Chronic low back pain without sciatica, unspecified back pain laterality  Neck pain, bilateral  Muscle weakness (generalized)     Problem List Patient Active Problem List   Diagnosis Date Noted  . Cholecystitis with cholelithiasis 02/27/2016  . Increased frequency of urination 01/27/2015  . History of night sweats 05/01/2014  . Dupuytren's contracture 07/18/2013  . Inverted nipple 04/18/2013  . Depression 04/23/2010  . Fibromyalgia 04/23/2010  . Hypercholesterolemia 04/23/2010  . Hypertension, benign 04/23/2010  . Hypothyroidism 04/23/2010  . Irritable colon 04/23/2010  . Osteoarthritis 04/23/2010    Jerl Mina ,PT, DPT, E-RYT  09/11/2017, 12:01 PM  [place] MAIN [facility] SERVICES [address] [place], [place], [address] Phone: [phone]   Fax:   [phone]  Name: Kendra Benton MRN: [id number] Date of Birth: 09/07/1940

## 2017-09-16 ENCOUNTER — Ambulatory Visit: Payer: Medicare Other | Admitting: Physical Therapy

## 2017-09-16 DIAGNOSIS — M545 Low back pain, unspecified: Secondary | ICD-10-CM

## 2017-09-16 DIAGNOSIS — M6281 Muscle weakness (generalized): Secondary | ICD-10-CM

## 2017-09-16 DIAGNOSIS — M542 Cervicalgia: Secondary | ICD-10-CM

## 2017-09-16 DIAGNOSIS — M533 Sacrococcygeal disorders, not elsewhere classified: Secondary | ICD-10-CM

## 2017-09-16 DIAGNOSIS — G8929 Other chronic pain: Secondary | ICD-10-CM

## 2017-09-16 NOTE — Patient Instructions (Addendum)
Side lying on L side Pelvic tilt forward  Then do clam shells  30 reps on The R only    _____  R knee to chest  With L knee bent, foot is stable  10 reps   ___  Squats 10 x 3 x 3 x day    Seated marches 30 sec x 3   Standing marches clapping thighs  30 sec x 3 with rest,   Out of chair, ankles behind knees, hands pushing seat of chair  To loosen up hips, hold onto chair, drawing half circles in to out and back 10 reps each leg   Standing marches clapping thighs  30 sec x 3 with rest,   Landing on [place]  -walking on

## 2017-09-18 NOTE — Therapy (Signed)
[place] MAIN [facility] SERVICES [place], [place], [address] Phone: [phone]   Fax:  [phone]  Physical Therapy Treatment  Patient Details  Name: Kendra Benton MRN: [id number] Date of Birth: 1941/03/01 Referring Provider: Sharia Reeve    Encounter Date: 09/16/2017    Past Medical History:  Diagnosis Date  . Allergy   . Hypertension   . Myocardial infarction ([place])    1997  . Osteoporosis     Past Surgical History:  Procedure Laterality Date  . ABDOMINAL HYSTERECTOMY     patient has ovaries  . APPENDECTOMY    . CHOLECYSTECTOMY N/A 02/28/2016   Procedure: LAPAROSCOPIC CHOLECYSTECTOMY WITH INTRAOPERATIVE CHOLANGIOGRAM;  Surgeon: Dia Crawford III, MD;  Location: [facility] ORS;  Service: General;  Laterality: N/A;  . LUMBAR FUSION  11/09/2016   L3-L5  . SPINE SURGERY     Disc    There were no vitals filed for this visit.  Subjective Assessment - 09/18/17 1626    Subjective  Pt reported she felt sore after last session. Pt felt the pain return off and on. The improvement has been 20%     Pertinent History  Pt feels tired all the time. "I feel like I am living a half-live and I feel depressed."     Patient Stated Goals  to feel normal like she did after completing PT back in 04/2017. To be able to go to the grocery store by her self          [facility] PT Assessment - 09/18/17 1621      Strength   Overall Strength Comments  sidelying Hip AB L 4-/5, R 5/5        Palpation   SI assessment   R iliac crest lower and more posterior than L, ( post Tx: levelled)    Palpation comment  hypomobility at R SIJ with tightness, tenderness to palpation along sacroiliac ligaments, cocygeus/ obt int at [place]  -- need to assess DGI, demo'd LOB w/ headturns. delayed stepping strategies                   [facility] Adult PT Treatment/Exercise - 09/18/17 1620      Neuro Re-ed    Neuro Re-ed Details   cued  for increased hip flexion in gait, more weight bearing onto fore/midfoot      Exercises   Exercises  -- see pt instructions      Manual Therapy   Manual therapy comments  rotational mob, PA mob along SIJ                   PT Long Term Goals - 09/18/17 1623      PT LONG TERM GOAL #1   Title  Pt will report being able to stand > 30 min without radiating pain nor < 2/10 pain at SIJ in order to stand and shop     Time  12    Period  Weeks    Status  On-going      PT LONG TERM GOAL #2   Title  Pt will be able to perform household chores with proper body mechanics     Time  6    Period  Weeks    Status  On-going      PT LONG TERM GOAL #3   Title  Pt will be able to modify yoga poses and perform classes  across 1 month without pain     Time  12    Period  Weeks    Status  On-going      PT LONG TERM GOAL #4   Title  Pt will decrease his ODI score  42% to <32 % in order to restore QOL and ADLs    Time  8    Period  Weeks    Status  On-going      PT LONG TERM GOAL #5   Title  Pt will decrease his NDI score  34% to <24 % in order to restore QOL and ADLs    Time  8    Period  Weeks    Status  On-going      PT LONG TERM GOAL #6   Title  Pt will increase her Cervical ROM from sidebend B 30 deg > 45 deg, rotation L 30 deg to > 45 deg     Time  4    Period  Weeks    Status  On-going      PT LONG TERM GOAL #7   Title  Pt will increase her gait speed from 0.8 m/s to> 1.2 m/s in order to ambulate in the community safely    Time  -- 12    Period  Weeks    Status  On-going      PT LONG TERM GOAL #8   Title  Pt will demo no pelvic obliquties and normal SIJ mobility and gait across 2 visits in order to decrease pain and return to ADLs    Time  2    Period  Weeks    Status  Partially Met      PT LONG TERM GOAL  #9   TITLE  Pt will demo improved balance with faster stepping strategies with perturbations at waist/ cues for headturns in order to minimize risk for falls      Time  4    Period  Weeks    Status  New            Plan - 09/18/17 1618    Clinical Impression Statement  Pt demo a more equal iliac crest with less  inferior R iliac crest, decreased coccgyeus mm, and increased R SIJ  mobility. Pt continues to demo decreased endurance and overall weakness  due to deconditioning. Pt also demo poor balance with delayed stepping strategies.  Plan to increase  frequency to 2x week due to these deficits. Pt continues to benefit from  skilled PT.        Rehab Potential  Good    PT Frequency  2x / week    PT Duration  12 weeks    PT Treatment/Interventions  Aquatic Therapy;Electrical Stimulation;Iontophoresis 59m/ml Dexamethasone;Therapeutic activities;Therapeutic exercise;Neuromuscular re-education;Patient/family education;Manual techniques;Dry needling;Functional mobility training;Stair training;Moist Heat;Manual lymph drainage;Gait training;Scar mobilization    Consulted and Agree with Plan of Care  Patient       Patient will benefit from skilled therapeutic intervention in order to improve the following deficits and impairments:  Pain, Difficulty walking, Decreased strength, Increased fascial restricitons, Decreased range of motion, Decreased safety awareness, Decreased coordination, Postural dysfunction, Improper body mechanics, Decreased scar mobility  Visit Diagnosis: Sacrococcygeal disorders, not elsewhere classified  Chronic low back pain without sciatica, unspecified back pain laterality  Neck pain, bilateral  Muscle weakness (generalized)     Problem List Patient Active Problem List   Diagnosis Date Noted  . Cholecystitis with cholelithiasis 02/27/2016  . Increased frequency  of urination 01/27/2015  . History of night sweats 05/01/2014  . Dupuytren's contracture 07/18/2013  . Inverted nipple 04/18/2013  . Depression 04/23/2010  . Fibromyalgia 04/23/2010  . Hypercholesterolemia 04/23/2010  . Hypertension, benign 04/23/2010   . Hypothyroidism 04/23/2010  . Irritable colon 04/23/2010  . Osteoarthritis 04/23/2010    Jerl Mina ,PT, DPT, E-RYT  09/18/2017, 4:26 PM  [place] MAIN [facility] SERVICES [address] [place], [place], [address] Phone: [phone]   Fax:  [phone]  Name: Kendra Benton MRN: [id number] Date of Birth: 03-05-1941

## 2017-09-22 ENCOUNTER — Ambulatory Visit: Payer: Medicare Other | Admitting: Physical Therapy

## 2017-09-22 DIAGNOSIS — M6281 Muscle weakness (generalized): Secondary | ICD-10-CM

## 2017-09-22 DIAGNOSIS — M533 Sacrococcygeal disorders, not elsewhere classified: Secondary | ICD-10-CM | POA: Diagnosis not present

## 2017-09-22 DIAGNOSIS — M5416 Radiculopathy, lumbar region: Secondary | ICD-10-CM

## 2017-09-22 DIAGNOSIS — M545 Low back pain, unspecified: Secondary | ICD-10-CM

## 2017-09-22 DIAGNOSIS — M542 Cervicalgia: Secondary | ICD-10-CM

## 2017-09-22 DIAGNOSIS — G8929 Other chronic pain: Secondary | ICD-10-CM

## 2017-09-22 NOTE — Therapy (Signed)
[place] MAIN [facility] SERVICES [place], [place], [address] Phone: [phone]   Fax:  [phone]  Physical Therapy Treatment  Patient Details  Name: Kendra Benton MRN: [id number] Date of Birth: [birth year] Referring Provider: Sharia Reeve  [id number]  Encounter Date: 09/22/2017    Past Medical History:  Diagnosis Date  . Allergy   . Hypertension   . Myocardial infarction ([place])    1997  . Osteoporosis     Past Surgical History:  Procedure Laterality Date  . ABDOMINAL HYSTERECTOMY     patient has ovaries  . APPENDECTOMY    . CHOLECYSTECTOMY N/A 02/28/2016   Procedure: LAPAROSCOPIC CHOLECYSTECTOMY WITH INTRAOPERATIVE CHOLANGIOGRAM;  Surgeon: Dia Crawford III, MD;  Location: [facility] ORS;  Service: General;  Laterality: N/A;  . LUMBAR FUSION  11/09/2016   L3-L5  . SPINE SURGERY     Disc    There were no vitals filed for this visit.  Subjective Assessment - 09/22/17 1056    Subjective  Pt hhas been able to return to her activities since last session without pain. Walking is better. Overall improvement in pain is 30%    Pertinent History  Pt feels tired all the time. "I feel like I am living a half-live and I feel depressed."     Patient Stated Goals  to feel normal like she did after completing PT back in 04/2017. To be able to go to the grocery store by her self          [facility] PT Assessment - 09/22/17 1115      Observation/Other Assessments   Observations  LOB walking around coffee table in waiting area, LOB w/ marchon pillow w/o BUE support-PT provided CGA .       Other:   Other/ Comments  poor feet awareness/ propioception in simulated yoga poses . excessive cues for increased weightbearing around tranverse arches       High Level Balance   High Level Balance Comments  pertubration training at waist:pt used hip strategy with LOB and BUE on furniture to self-correct for LOB. Excessive cues for stepping strategies and  less hyperextended knees                    [facility] Adult PT Treatment/Exercise - 09/22/17 1115      Therapeutic Activites    Therapeutic Activities  -- see pt instructions; balance training : PERTURBATION AT WAIST IN place  5' and while walking 10 ft x 2 laps      Neuro Re-ed    Neuro Re-ed Details   see pt instructions             PT Education - 09/22/17 1119    Education provided  Yes    Education Details  HEP    Person(s) Educated  Patient    Methods  Demonstration;Explanation;Tactile cues;Handout;Verbal cues    Comprehension  Returned demonstration;Verbalized understanding          PT Long Term Goals - 09/18/17 1623      PT LONG TERM GOAL #1   Title  Pt will report being able to stand > 30 min without radiating pain nor < 2/10 pain at SIJ in order to stand and shop     Time  12    Period  Weeks    Status  On-going      PT LONG TERM GOAL #2   Title  Pt will be able to perform household chores with  proper body mechanics     Time  6    Period  Weeks    Status  On-going      PT LONG TERM GOAL #3   Title  Pt will be able to modify yoga poses and perform classes across 1 month without pain     Time  12    Period  Weeks    Status  On-going      PT LONG TERM GOAL #4   Title  Pt will decrease his ODI score  42% to <32 % in order to restore QOL and ADLs    Time  8    Period  Weeks    Status  On-going      PT LONG TERM GOAL #5   Title  Pt will decrease his NDI score  34% to <24 % in order to restore QOL and ADLs    Time  8    Period  Weeks    Status  On-going      PT LONG TERM GOAL #6   Title  Pt will increase her Cervical ROM from sidebend B 30 deg > 45 deg, rotation L 30 deg to > 45 deg     Time  4    Period  Weeks    Status  On-going      PT LONG TERM GOAL #7   Title  Pt will increase her gait speed from 0.8 m/s to> 1.2 m/s in order to ambulate in the community safely    Time  -- 12    Period  Weeks    Status  On-going      PT LONG  TERM GOAL #8   Title  Pt will demo no pelvic obliquties and normal SIJ mobility and gait across 2 visits in order to decrease pain and return to ADLs    Time  2    Period  Weeks    Status  Partially Met      PT LONG TERM GOAL  #9   TITLE  Pt will demo improved balance with faster stepping strategies with perturbations at waist/ cues for headturns in order to minimize risk for falls     Time  4    Period  Weeks    Status  New            Plan - 09/22/17 1120    Clinical Impression Statement  Pt is progressingw ell since last session as she demo'd a more levelled pelvic alignment and reports she feels better with walking and returning to her normal activities. Addressed pt's need for falls reduction training today. Pt demo'd poor propioception of her feet and lacked proper stepping stratgeies with pertubration training. Pt demo'd improvements in there areas with training and new HEP. Pt continues to benefit from skilled PT     Rehab Potential  Good    PT Frequency  2x / week    PT Duration  12 weeks    PT Treatment/Interventions  Aquatic Therapy;Electrical Stimulation;Iontophoresis '4mg'$ /ml Dexamethasone;Therapeutic activities;Therapeutic exercise;Neuromuscular re-education;Patient/family education;Manual techniques;Dry needling;Functional mobility training;Stair training;Moist Heat;Manual lymph drainage;Gait training;Scar mobilization    Consulted and Agree with Plan of Care  Patient       Patient will benefit from skilled therapeutic intervention in order to improve the following deficits and impairments:  Pain, Difficulty walking, Decreased strength, Increased fascial restricitons, Decreased range of motion, Decreased safety awareness, Decreased coordination, Postural dysfunction, Improper body mechanics, Decreased scar mobility  Visit Diagnosis: Sacrococcygeal disorders,  not elsewhere classified  Muscle weakness (generalized)  Chronic low back pain without sciatica, unspecified  back pain laterality  Neck pain, bilateral  Radiculopathy, lumbar region     Problem List Patient Active Problem List   Diagnosis Date Noted  . Cholecystitis with cholelithiasis 02/27/2016  . Increased frequency of urination 01/27/2015  . History of night sweats 05/01/2014  . Dupuytren's contracture 07/18/2013  . Inverted nipple 04/18/2013  . Depression 04/23/2010  . Fibromyalgia 04/23/2010  . Hypercholesterolemia 04/23/2010  . Hypertension, benign 04/23/2010  . Hypothyroidism 04/23/2010  . Irritable colon 04/23/2010  . Osteoarthritis 04/23/2010    Jerl Mina ,PT, DPT, E-RYT  09/22/2017, 11:24 AM  [place] MAIN [facility] SERVICES [address] [place], [place], [address] Phone: [phone]   Fax:  [phone]  Name: Kendra Benton MRN: [id number] Date of Birth: March 14, 1941

## 2017-09-22 NOTE — Patient Instructions (Signed)
3  Sets  X 2 x day    Step backs with feet hip width apart, keep back heel up, ballmounds spread  30 reps on each  X 3  Sets  X 2 x day  Hands on wall  ________  Standing marches  2 min   Standing marches by the wall with pillow under feet  Notice where feet goes  2 min   ________  Focus on four corners of feet in bridges, warrior, and [place]  Less curling at toes,  Soft knees  ______  Practice with husband on the gentle pushes in place at the waist in all four directions for stepping strategies ( soft knees, weight into feet, wide base of support)

## 2017-09-25 ENCOUNTER — Ambulatory Visit: Payer: Medicare Other | Admitting: Physical Therapy

## 2017-09-25 DIAGNOSIS — G8929 Other chronic pain: Secondary | ICD-10-CM

## 2017-09-25 DIAGNOSIS — M533 Sacrococcygeal disorders, not elsewhere classified: Secondary | ICD-10-CM | POA: Diagnosis not present

## 2017-09-25 DIAGNOSIS — M545 Low back pain, unspecified: Secondary | ICD-10-CM

## 2017-09-25 DIAGNOSIS — M5416 Radiculopathy, lumbar region: Secondary | ICD-10-CM

## 2017-09-25 DIAGNOSIS — M542 Cervicalgia: Secondary | ICD-10-CM

## 2017-09-25 DIAGNOSIS — M6281 Muscle weakness (generalized): Secondary | ICD-10-CM

## 2017-09-25 NOTE — Therapy (Signed)
[place] MAIN [facility] SERVICES [address] [place], [place], [address] Phone: [phone]   Fax:  [phone]  Physical Therapy Treatment  Patient Details  Name: Kendra Benton MRN: [id number] Date of Birth: [birth year] Referring Provider: Sharia Reeve    Encounter Date: 09/25/2017    Past Medical History:  Diagnosis Date  . Allergy   . Hypertension   . Myocardial infarction ([place])    1997  . Osteoporosis     Past Surgical History:  Procedure Laterality Date  . ABDOMINAL HYSTERECTOMY     patient has ovaries  . APPENDECTOMY    . CHOLECYSTECTOMY N/A 02/28/2016   Procedure: LAPAROSCOPIC CHOLECYSTECTOMY WITH INTRAOPERATIVE CHOLANGIOGRAM;  Surgeon: Dia Crawford III, MD;  Location: [facility] ORS;  Service: General;  Laterality: N/A;  . LUMBAR FUSION  11/09/2016   L3-L5  . SPINE SURGERY     Disc    There were no vitals filed for this visit.  Subjective Assessment - 09/25/17 1251    Subjective  Pt reports she is better much better and over did it yeaterday with taking clothes out of her closet and bending. Pt today feels achiness in her R hip from her activities     Pertinent History  Pt feels tired all the time. "I feel like I am living a half-live and I feel depressed."     Patient Stated Goals  to feel normal like she did after completing PT back in 04/2017. To be able to go to the grocery store by her self          [facility] PT Assessment - 09/25/17 1021      Observation/Other Assessments   Observations  supination in plantarflexion preTx, excessive cues for pronation       PROM   Overall PROM Comments  L DF 7 deg, R 10 deg post Tx: L 15deg, R 20 deg        Strength   Overall Strength Comments  double plantarflexion       Ambulation/Gait   Gait Comments  improved gait, increased hip flexion without cuing                    [facility] Adult PT Treatment/Exercise - 09/25/17 1057      Neuro Re-ed    Neuro Re-ed Details   see pt  instructions      Manual Therapy   Manual therapy comments  distraction talus/crural, medial glide,. AP/PA mob at navicular/cuneiform,  5th metatarsal, STM between rays,                   PT Long Term Goals - 09/25/17 1259      PT LONG TERM GOAL #1   Title  Pt will report being able to stand > 30 min without radiating pain nor < 2/10 pain at SIJ in order to stand and shop     Time  12    Period  Weeks    Status  On-going      PT LONG TERM GOAL #2   Title  Pt will be able to perform household chores with proper body mechanics     Time  6    Period  Weeks    Status  On-going      PT LONG TERM GOAL #3   Title  Pt will be able to modify yoga poses and perform classes across 1 month without pain     Time  12  Period  Weeks    Status  On-going      PT LONG TERM GOAL #4   Title  Pt will decrease his ODI score  42% to <32 % in order to restore QOL and ADLs    Time  8    Period  Weeks    Status  On-going      PT LONG TERM GOAL #5   Title  Pt will decrease her NDI score  34% to <24 % in order to restore QOL and ADLs    Time  8    Period  Weeks    Status  On-going      PT LONG TERM GOAL #6   Title  Pt will increase her Cervical ROM from sidebend B 30 deg > 45 deg, rotation L 30 deg to > 45 deg     Time  4    Period  Weeks    Status  On-going      PT LONG TERM GOAL #7   Title  Pt will increase her gait speed from 0.8 m/s to> 1.2 m/s in order to ambulate in the community safely    Time  -- 12    Period  Weeks    Status  On-going      PT LONG TERM GOAL #8   Title  Pt will demo no pelvic obliquties and normal SIJ mobility and gait across 2 visits in order to decrease pain and return to ADLs    Time  2    Period  Weeks    Status  Achieved      PT LONG TERM GOAL  #9   TITLE  Pt will demo improved balance with faster stepping strategies with perturbations at waist/ cues for headturns in order to minimize risk for falls     Time  4    Period  Weeks    Status   New      PT LONG TERM GOAL  #10   TITLE  Pt will demo increased DF on L foot from 7 deg to > 15 deg and improved in PF strength from double heel raises to single raises Grade 2+ for 5+ reps B with single UE on wall  in order to progress to stairclimbing/ descent with less difficulty and hip pain.     Time  10    Period  Weeks    Status  New            Plan - 09/25/17 1304    Clinical Impression Statement  Pt showed a more equally aligned pelvis today with good carry over with gait mechanics. Addressed her limited DF in L ankle with manual Tx which she tolerated without complaints. Progressed pt to dynamic balance exercises to minimize risk and to increase plantar fascia to progress towards stair navgation with less difficutly. risk for falls and pain. Pt continues to benefit from skilled PT.     Rehab Potential  Good    PT Frequency  2x / week    PT Duration  12 weeks    PT Treatment/Interventions  Aquatic Therapy;Electrical Stimulation;Iontophoresis 4mg /ml Dexamethasone;Therapeutic activities;Therapeutic exercise;Neuromuscular re-education;Patient/family education;Manual techniques;Dry needling;Functional mobility training;Stair training;Moist Heat;Manual lymph drainage;Gait training;Scar mobilization    Consulted and Agree with Plan of Care  Patient       Patient will benefit from skilled therapeutic intervention in order to improve the following deficits and impairments:  Pain, Difficulty walking, Decreased strength, Increased fascial restricitons, Decreased range of motion,  Decreased safety awareness, Decreased coordination, Postural dysfunction, Improper body mechanics, Decreased scar mobility  Visit Diagnosis: Muscle weakness (generalized)  Chronic low back pain without sciatica, unspecified back pain laterality  Sacrococcygeal disorders, not elsewhere classified  Neck pain, bilateral  Radiculopathy, lumbar region  Chronic bilateral low back pain, with sciatica presence  unspecified     Problem List Patient Active Problem List   Diagnosis Date Noted  . Cholecystitis with cholelithiasis 02/27/2016  . Increased frequency of urination 01/27/2015  . History of night sweats 05/01/2014  . Dupuytren's contracture 07/18/2013  . Inverted nipple 04/18/2013  . Depression 04/23/2010  . Fibromyalgia 04/23/2010  . Hypercholesterolemia 04/23/2010  . Hypertension, benign 04/23/2010  . Hypothyroidism 04/23/2010  . Irritable colon 04/23/2010  . Osteoarthritis 04/23/2010    Jerl Mina  ,PT, DPT, E-RYT  09/25/2017, 1:07 PM  [place] MAIN [facility] SERVICES [address] [place], [place], [address] Phone: [phone]   Fax:  [phone]  Name: Kendra Benton MRN: [id number] Date of Birth: 1940-07-30

## 2017-09-25 NOTE — Patient Instructions (Signed)
New routine  1) double heel raises without twerking ankles, keep ankles straight 30 reps   X 3   _______  2) mini squats  20 reps x 3     _______  3) 3 min  opp arm ( "v"" for shoulders down) and leg back, with heel straight and toes too    _______ 4) marching with high thighs 2 min  / on pillows 2 min

## 2017-09-29 ENCOUNTER — Ambulatory Visit: Payer: Medicare Other | Admitting: Physical Therapy

## 2017-09-29 VITALS — BP 110/70

## 2017-09-29 DIAGNOSIS — M5416 Radiculopathy, lumbar region: Secondary | ICD-10-CM

## 2017-09-29 DIAGNOSIS — M545 Low back pain, unspecified: Secondary | ICD-10-CM

## 2017-09-29 DIAGNOSIS — M6281 Muscle weakness (generalized): Secondary | ICD-10-CM

## 2017-09-29 DIAGNOSIS — M533 Sacrococcygeal disorders, not elsewhere classified: Secondary | ICD-10-CM | POA: Diagnosis not present

## 2017-09-29 DIAGNOSIS — M542 Cervicalgia: Secondary | ICD-10-CM

## 2017-09-29 DIAGNOSIS — G8929 Other chronic pain: Secondary | ICD-10-CM

## 2017-09-29 NOTE — Therapy (Signed)
[place] MAIN [facility] SERVICES [address] [place], [place], [address] Phone: [phone]   Fax:  [phone]  Physical Therapy Treatment  Patient Details  Name: Kendra Benton MRN: [id number] Date of Birth: March 16, 1941 Referring Provider: Sharia Reeve    Encounter Date: 09/29/2017  PT End of Session - 09/29/17 1754    Visit Number  5    Date for PT Re-Evaluation  10/26/17    PT Start Time  0932    PT Stop Time  1410    PT Time Calculation (min)  65 min    Activity Tolerance  Patient tolerated treatment well    Behavior During Therapy  [facility] for tasks assessed/performed       Past Medical History:  Diagnosis Date  . Allergy   . Hypertension   . Myocardial infarction ([place])    1997  . Osteoporosis     Past Surgical History:  Procedure Laterality Date  . ABDOMINAL HYSTERECTOMY     patient has ovaries  . APPENDECTOMY    . CHOLECYSTECTOMY N/A 02/28/2016   Procedure: LAPAROSCOPIC CHOLECYSTECTOMY WITH INTRAOPERATIVE CHOLANGIOGRAM;  Surgeon: Dia Crawford III, MD;  Location: [facility] ORS;  Service: General;  Laterality: N/A;  . LUMBAR FUSION  11/09/2016   L3-L5  . SPINE SURGERY     Disc    Vitals:   09/29/17 1400  BP: 110/70    Subjective Assessment - 09/29/17 1311    Subjective  Pt reports 40% improvement compared to 30% from last week. Pt is not spending as much time on the heating pad anymore. Pt saw her orthopedist [facility] Dr. Colbert Coyer)  and she had an X ray. His MD recommended her to continue with PT for 4 more weeks.  Pt continues to have increased activities with shopping and yoga classes. Pt today feel R hip hurts but does not radiate. The bones themselves hurt. The area is located on the side hip and hurts with stairs. Pt had perineal tears with her first child, had gallbladder removal in addition to her lumbar surgery, 2 bladder tacks.       Pertinent History  Pt feels tired all the time. "I feel like I am living a half-live and I feel  depressed."     Patient Stated Goals  to feel normal like she did after completing PT back in 04/2017. To be able to go to the grocery store by her self          [facility] PT Assessment - 09/29/17 2244      Other:   Other/ Comments  stairclimbing/lowering: pt showed no difficulty , required cues for wider BOS, more plantarflexion/inversion . PT provided SBA , cued pt for BUE on rails for safety                   Pelvic Floor Special Questions - 09/29/17 2340    Pelvic Floor Internal Exam  pt consented verbally without contraindications    Exam Type  Vaginal    Palpation  increased tightness/tenderness at R ilio/ischiococcygeus, over ischial spine, superficial/deep tranverse perineal mm, iscioanal fossa B , 1st pelvic floor layer 5-7 o'clock       Strength  good squeeze, good lift, able to hold agaisnt strong resistance        [facility] Adult PT Treatment/Exercise - 09/29/17 2313      Neuro Re-ed    Neuro Re-ed Details   see pt instructions, stair navigation      Manual Therapy  Internal Pelvic Floor  scar releases at probelm areas noted in assessment              PT Education - 09/29/17 2236    Education provided  Yes    Education Details  HEP    Person(s) Educated  Patient    Methods  Explanation;Demonstration;Tactile cues;Verbal cues;Handout    Comprehension  Verbalized understanding;Returned demonstration;Verbal cues required          PT Long Term Goals - 09/29/17 2349      PT LONG TERM GOAL #1   Title  Pt will report being able to stand > 30 min without radiating pain nor < 2/10 pain at SIJ in order to stand and shop     Time  12    Period  Weeks    Status  On-going      PT LONG TERM GOAL #2   Title  Pt will be able to perform household chores with proper body mechanics     Time  6    Period  Weeks    Status  On-going      PT LONG TERM GOAL #3   Title  Pt will be able to modify yoga poses and perform classes across 1 month without pain     Time   12    Period  Weeks    Status  On-going      PT LONG TERM GOAL #4   Title  Pt will decrease his ODI score  42% to <32 % in order to restore QOL and ADLs    Time  8    Period  Weeks    Status  On-going      PT LONG TERM GOAL #5   Title  Pt will decrease her NDI score  34% to <24 % in order to restore QOL and ADLs    Time  8    Period  Weeks    Status  On-going      PT LONG TERM GOAL #6   Title  Pt will increase her Cervical ROM from sidebend B 30 deg > 45 deg, rotation L 30 deg to > 45 deg     Time  4    Period  Weeks    Status  On-going      PT LONG TERM GOAL #7   Title  Pt will increase her gait speed from 0.8 m/s to> 1.2 m/s in order to ambulate in the community safely    Time  -- 12    Period  Weeks    Status  On-going      PT LONG TERM GOAL #8   Title  Pt will demo no pelvic obliquties and normal SIJ mobility and gait across 2 visits in order to decrease pain and return to ADLs    Time  2    Period  Weeks    Status  Achieved      PT LONG TERM GOAL  #9   TITLE  Pt will demo improved balance with faster stepping strategies with perturbations at waist/ cues for headturns in order to minimize risk for falls     Time  4    Period  Weeks    Status  New      PT LONG TERM GOAL  #10   TITLE  Pt will demo increased DF on L foot from 7 deg to > 15 deg and improved in PF strength from double heel raises to single  raises Grade 2+ for 5+ reps B with single UE on wall  in order to progress to stairclimbing/ descent with less difficulty and hip pain.     Time  10    Period  Weeks    Status  New      PT LONG TERM GOAL  #11   TITLE  Pt will demo no pelvic floor tightness/ tenderness across 2 visits in order to climb/lower stairs with single UE , step through p attern with less difficulty and pain    Time  4    Period  Weeks            Plan - 09/29/17 2243    Clinical Impression Statement Pt demo'd decreased perineal scar restrictions internally and at superficial /deep  transverse perineal mm post Tx. Pt reported less pain in her R hip and less difficutly with stair navigation. Pt demo'd less difficulty with step through pattern compared to last year with PT as pt demo'd increased hip flexion.  However, cued for BUE on rails, wider BOS, and more plantarflexion with better eccentric control. Pt felt fatigued with climbing 1.5 flight of stairs without SOB. BP 110/70 mm Hg after seated rest of 5 min. Continue to monitor pt's endurance and modify activities given pt's Hx as a smoker, recently decreased physical activity, anxiety, and report of intermittent headaches. Pt has an appt with cardiologist in upcoming weeks. Anticipate pelvic floor plays a role with her remainder of localized R hip pain.  Pt continues to benefit from skilled PT to strengthen posterior chain of mm and feet mm .  Plan to take 6 min walk test at upcoming session   Rehab Potential  Good    PT Frequency  2x / week    PT Duration  12 weeks    PT Treatment/Interventions  Aquatic Therapy;Electrical Stimulation;Iontophoresis 4mg /ml Dexamethasone;Therapeutic activities;Therapeutic exercise;Neuromuscular re-education;Patient/family education;Manual techniques;Dry needling;Functional mobility training;Stair training;Moist Heat;Manual lymph drainage;Gait training;Scar mobilization    Consulted and Agree with Plan of Care  Patient       Patient will benefit from skilled therapeutic intervention in order to improve the following deficits and impairments:  Pain, Difficulty walking, Decreased strength, Increased fascial restricitons, Decreased range of motion, Decreased safety awareness, Decreased coordination, Postural dysfunction, Improper body mechanics, Decreased scar mobility  Visit Diagnosis: Muscle weakness (generalized)  Chronic low back pain without sciatica, unspecified back pain laterality  Sacrococcygeal disorders, not elsewhere classified  Neck pain, bilateral  Radiculopathy, lumbar  region     Problem List Patient Active Problem List   Diagnosis Date Noted  . Cholecystitis with cholelithiasis 02/27/2016  . Increased frequency of urination 01/27/2015  . History of night sweats 05/01/2014  . Dupuytren's contracture 07/18/2013  . Inverted nipple 04/18/2013  . Depression 04/23/2010  . Fibromyalgia 04/23/2010  . Hypercholesterolemia 04/23/2010  . Hypertension, benign 04/23/2010  . Hypothyroidism 04/23/2010  . Irritable colon 04/23/2010  . Osteoarthritis 04/23/2010    Jerl Mina ,PT, DPT, E-RYT  09/29/2017, 11:53 PM  [place] MAIN [facility] SERVICES [address] [place], [place], [address] Phone: [phone]   Fax:  [phone]  Name: Kendra Benton MRN: [id number] Date of Birth: 16-Jun-1940

## 2017-09-29 NOTE — Patient Instructions (Signed)
Stretch for pelvic floor   "v heels slide away and then back toward buttocks and then rock knee to slight ,  slide heel along at 11 o clock away from buttocks   10 reps  _____  Notice  Practice proper pelvic floor coordination  Inhale: expand pelvic floor mm Exhale" "j" scoop, allow pelvic floor to close, lift first before belly sinks   ( not "draw abdominal muscle to spine" or strain with abdominal muscles")   ____  More push off on ballmounds with stair climbing, lowering step  ____  Take pictures of shoes, avoid shoes that are slip on to minimize risk for falls

## 2017-09-30 ENCOUNTER — Ambulatory Visit: Payer: Medicare Other | Admitting: Physical Therapy

## 2017-09-30 DIAGNOSIS — M533 Sacrococcygeal disorders, not elsewhere classified: Secondary | ICD-10-CM

## 2017-09-30 DIAGNOSIS — G8929 Other chronic pain: Secondary | ICD-10-CM

## 2017-09-30 DIAGNOSIS — M6281 Muscle weakness (generalized): Secondary | ICD-10-CM

## 2017-09-30 DIAGNOSIS — M545 Low back pain, unspecified: Secondary | ICD-10-CM

## 2017-09-30 DIAGNOSIS — M542 Cervicalgia: Secondary | ICD-10-CM

## 2017-09-30 NOTE — Patient Instructions (Addendum)
KEGEL EXERCISES   Pelvic floor/ Kegel exercises are used to strengthen the muscles in the base of your pelvis that are responsible for supporting your pelvic organs and preventing urine/feces leakage. Based on your therapist's recommendations, they can be performed while standing, sitting, or lying down. Imagine pelvic floor area as a diamond with pelvic landmarks: top =pubic bone, bottom tip=tailbone, sides=sitting bones (ischial tuberosities).    Make yourself aware of this muscle group by using these cues while coordinating your breath:  Inhale, feel pelvic floor diamond area lower like hammock towards your feet and ribcage/belly expanding. Pause. Let the exhale naturally and feel your belly sink, abdominal muscles hugging in around you and you may notice the pelvic diamond draws upward towards your head forming a umbrella shape. Give a squeeze during the exhalation like you are stopping the flow of urine. If you are squeezing the buttock muscles, try to give 50% less effort.   Common Errors:  Breath holding: If you are holding your breath, you may be bearing down against your bladder instead of pulling it up. If you belly bulges up while you are squeezing, you are holding your breath. Be sure to breathe gently in and out while exercising. Counting out loud may help you avoid holding your breath.  Accessory muscle use: You should not see or feel other muscle movement when performing pelvic floor exercises. When done properly, no one can tell that you are performing the exercises. Keep the buttocks, belly and inner thighs relaxed.  Overdoing it: Your muscles can fatigue and stop working for you if you over-exercise. You may actually leak more or feel soreness at the lower abdomen or rectum.  YOUR HOME EXERCISE PROGRAM  LONG HOLDS:   Position: on back or reclined in car seat ( do not lift head to sit up, instead make sure to use the handle to raise the car seat up and keep spine/head relaxed  to not place load on pelvic floor/ abdominal muscle)     Inhale and then exhale. Then squeeze the muscle and count aloud for 6 seconds. Rest with three long breaths. (Be sure to let belly sink in with exhales and not push outward)  Perform 3 repetitions, 3 times/day                       DECREASE DOWNWARD PRESSURE ON  YOUR PELVIC FLOOR, ABDOMINAL, LOW BACK MUSCLES       PRESERVE YOUR PELVIC HEALTH LONG-TERM   ** SQUEEZE pelvic floor BEFORE YOUR SNEEZE, COUGH, LAUGH   ** EXHALE BEFORE YOU RISE AGAINST GRAVITY (lifting, sit to stand, from squat to stand)   ** LOG ROLL OUT OF BED INSTEAD OF CRUNCH/SIT-UP       ______  Progress from heel raises   Use a hand towel rolled with rubber band and put between heels while sitting in chair  Stand with both hands on table with feet/  ballerina feet angle Heel raises with heels together squeezing towel   30 reps x 3  PELVIC FLOOR /

## 2017-10-01 NOTE — Therapy (Signed)
[place] MAIN [facility] SERVICES [address] [place], [place], [address] Phone: [phone]   Fax:  [phone]  Physical Therapy Treatment  Patient Details  Name: Kendra Benton MRN: [id number] Date of Birth: 1941/04/20 Referring Provider: Sharia Reeve    Encounter Date: 09/30/2017  PT End of Session - 10/01/17 1621    Visit Number  6    Date for PT Re-Evaluation  10/26/17    PT Start Time  1602    PT Stop Time  1700    PT Time Calculation (min)  58 min    Activity Tolerance  Patient tolerated treatment well    Behavior During Therapy  [facility] for tasks assessed/performed       Past Medical History:  Diagnosis Date  . Allergy   . Hypertension   . Myocardial infarction ([place])    1997  . Osteoporosis     Past Surgical History:  Procedure Laterality Date  . ABDOMINAL HYSTERECTOMY     patient has ovaries  . APPENDECTOMY    . CHOLECYSTECTOMY N/A 02/28/2016   Procedure: LAPAROSCOPIC CHOLECYSTECTOMY WITH INTRAOPERATIVE CHOLANGIOGRAM;  Surgeon: Dia Crawford III, MD;  Location: [facility] ORS;  Service: General;  Laterality: N/A;  . LUMBAR FUSION  11/09/2016   L3-L5  . SPINE SURGERY     Disc    There were no vitals filed for this visit.  Subjective Assessment - 09/30/17 1642    Subjective  Pt reported she practiced the stretches . She noticed she was able to lift her R thigh higher with stairs after last session    Pertinent History  Pt feels tired all the time. "I feel like I am living a half-live and I feel depressed."     Patient Stated Goals  to feel normal like she did after completing PT back in 04/2017. To be able to go to the grocery store by her self          [facility] PT Assessment - 10/01/17 1621      Observation/Other Assessments-Edema    Edema  -- required BUE support with new heel raise exercise due LOB       Ambulation/Gait   Gait Comments  good carry over with stair trianing but required cues for wider BOS                 Pelvic Floor Special Questions - 10/01/17 1619    Pelvic Floor Internal Exam  pt consented verbally without contraindications    Exam Type  Vaginal    Palpation  increased tightness/tenderness at R ischiofossa and perineum , no tensions internally compared to yesterday       Strength  good squeeze, good lift, able to hold agaisnt strong resistance    Strength # of reps  3    Strength # of seconds  6        [facility] Adult PT Treatment/Exercise - 10/01/17 1620      Therapeutic Activites    Therapeutic Activities  -- discussed proper shoe wear      Neuro Re-ed    Neuro Re-ed Details   see pt instructions, stair navigation      Manual Therapy   Internal Pelvic Floor  scar releases at probelm areas noted in assessment              PT Education - 09/30/17 1657    Education provided  Yes    Education Details  HEP    Person(s) Educated  Patient  Methods  Explanation;Demonstration;Tactile cues;Handout    Comprehension  Returned demonstration;Verbalized understanding          PT Long Term Goals - 09/29/17 2349      PT LONG TERM GOAL #1   Title  Pt will report being able to stand > 30 min without radiating pain nor < 2/10 pain at SIJ in order to stand and shop     Time  12    Period  Weeks    Status  On-going      PT LONG TERM GOAL #2   Title  Pt will be able to perform household chores with proper body mechanics     Time  6    Period  Weeks    Status  On-going      PT LONG TERM GOAL #3   Title  Pt will be able to modify yoga poses and perform classes across 1 month without pain     Time  12    Period  Weeks    Status  On-going      PT LONG TERM GOAL #4   Title  Pt will decrease his ODI score  42% to <32 % in order to restore QOL and ADLs    Time  8    Period  Weeks    Status  On-going      PT LONG TERM GOAL #5   Title  Pt will decrease her NDI score  34% to <24 % in order to restore QOL and ADLs    Time  8    Period  Weeks    Status   On-going      PT LONG TERM GOAL #6   Title  Pt will increase her Cervical ROM from sidebend B 30 deg > 45 deg, rotation L 30 deg to > 45 deg     Time  4    Period  Weeks    Status  On-going      PT LONG TERM GOAL #7   Title  Pt will increase her gait speed from 0.8 m/s to> 1.2 m/s in order to ambulate in the community safely    Time  -- 12    Period  Weeks    Status  On-going      PT LONG TERM GOAL #8   Title  Pt will demo no pelvic obliquties and normal SIJ mobility and gait across 2 visits in order to decrease pain and return to ADLs    Time  2    Period  Weeks    Status  Achieved      PT LONG TERM GOAL  #9   TITLE  Pt will demo improved balance with faster stepping strategies with perturbations at waist/ cues for headturns in order to minimize risk for falls     Time  4    Period  Weeks    Status  New      PT LONG TERM GOAL  #10   TITLE  Pt will demo increased DF on L foot from 7 deg to > 15 deg and improved in PF strength from double heel raises to single raises Grade 2+ for 5+ reps B with single UE on wall  in order to progress to stairclimbing/ descent with less difficulty and hip pain.     Time  10    Period  Weeks    Status  New      PT LONG TERM GOAL  #11  TITLE  Pt will demo no pelvic floor tightness/ tenderness across 2 visits in order to climb/lower stairs with single UE , step through p attern with less difficulty and pain    Time  4    Period  Weeks            Plan - 10/01/17 1623    Clinical Impression Statement  Pt showed signficantly decreased pelvic floor scar tightness with less tenderness compared to last session This has made a positive difference in her ability with stairs navigation with swing through pattern ( BUE support) . Today pt had good carry over with stair training but required moderate cues for wider BOS.  Manual Tx facilitated further mobility of pelvic floor post Tx. Advanced with pelvic floor strengthening today.  Initiated  plantarflexion/ inversion exercise but modifed it to ensure pt had BUE support and safe transition for stability due to LOB with single UE. Pt continues to benefit from skilled PT.       Rehab Potential  Good    PT Frequency  2x / week    PT Duration  12 weeks    PT Treatment/Interventions  Aquatic Therapy;Electrical Stimulation;Iontophoresis 4mg /ml Dexamethasone;Therapeutic activities;Therapeutic exercise;Neuromuscular re-education;Patient/family education;Manual techniques;Dry needling;Functional mobility training;Stair training;Moist Heat;Manual lymph drainage;Gait training;Scar mobilization    Consulted and Agree with Plan of Care  Patient       Patient will benefit from skilled therapeutic intervention in order to improve the following deficits and impairments:  Pain, Difficulty walking, Decreased strength, Increased fascial restricitons, Decreased range of motion, Decreased safety awareness, Decreased coordination, Postural dysfunction, Improper body mechanics, Decreased scar mobility  Visit Diagnosis: Muscle weakness (generalized)  Chronic low back pain without sciatica, unspecified back pain laterality  Sacrococcygeal disorders, not elsewhere classified  Neck pain, bilateral     Problem List Patient Active Problem List   Diagnosis Date Noted  . Cholecystitis with cholelithiasis 02/27/2016  . Increased frequency of urination 01/27/2015  . History of night sweats 05/01/2014  . Dupuytren's contracture 07/18/2013  . Inverted nipple 04/18/2013  . Depression 04/23/2010  . Fibromyalgia 04/23/2010  . Hypercholesterolemia 04/23/2010  . Hypertension, benign 04/23/2010  . Hypothyroidism 04/23/2010  . Irritable colon 04/23/2010  . Osteoarthritis 04/23/2010    Jerl Mina ,PT, DPT, E-RYT  10/01/2017, 4:27 PM  [place] MAIN [facility] SERVICES [address] [place], [place], [address] Phone: [phone]   Fax:  [phone]  Name:  Kendra Benton MRN: [id number] Date of Birth: [birth year]

## 2017-10-06 ENCOUNTER — Ambulatory Visit: Payer: Medicare Other | Admitting: Physical Therapy

## 2017-10-06 DIAGNOSIS — M545 Low back pain, unspecified: Secondary | ICD-10-CM

## 2017-10-06 DIAGNOSIS — M533 Sacrococcygeal disorders, not elsewhere classified: Secondary | ICD-10-CM

## 2017-10-06 DIAGNOSIS — R001 Bradycardia, unspecified: Secondary | ICD-10-CM | POA: Insufficient documentation

## 2017-10-06 DIAGNOSIS — M6281 Muscle weakness (generalized): Secondary | ICD-10-CM

## 2017-10-06 DIAGNOSIS — M542 Cervicalgia: Secondary | ICD-10-CM

## 2017-10-06 DIAGNOSIS — G8929 Other chronic pain: Secondary | ICD-10-CM

## 2017-10-06 DIAGNOSIS — M5416 Radiculopathy, lumbar region: Secondary | ICD-10-CM

## 2017-10-06 NOTE — Therapy (Addendum)
[place] MAIN [facility] SERVICES [address] [place], [place], [address] Phone: [phone]   Fax:  [phone]  Physical Therapy Treatment  Patient Details  Name: Kendra Benton MRN: [id number] Date of Birth: [birth year] Referring Provider: Sharia Reeve    Encounter Date: 10/06/2017  PT End of Session - 10/06/17 1105    Visit Number  7    Date for PT Re-Evaluation  10/26/17    PT Start Time  1000    PT Stop Time  1105    PT Time Calculation (min)  65 min    Activity Tolerance  Patient tolerated treatment well    Behavior During Therapy  [facility] for tasks assessed/performed       Past Medical History:  Diagnosis Date  . Allergy   . Hypertension   . Myocardial infarction ([place])    1997  . Osteoporosis     Past Surgical History:  Procedure Laterality Date  . ABDOMINAL HYSTERECTOMY     patient has ovaries  . APPENDECTOMY    . CHOLECYSTECTOMY N/A 02/28/2016   Procedure: LAPAROSCOPIC CHOLECYSTECTOMY WITH INTRAOPERATIVE CHOLANGIOGRAM;  Surgeon: Dia Crawford III, MD;  Location: [facility] ORS;  Service: General;  Laterality: N/A;  . LUMBAR FUSION  11/09/2016   L3-L5  . SPINE SURGERY     Disc    There were no vitals filed for this visit.  Subjective Assessment - 10/06/17 1011    Subjective  Pt reported she has signed up for tai chi classes. Pt has been walking up and down her stairs with 60-70% improvement. The R hip pain is alot less with stairs. Pt has noticed R groin pain off and on with yoga and other very few movements.     Pertinent History  Pt feels tired all the time. "I feel like I am living a half-live and I feel depressed."     Patient Stated Goals  to feel normal like she did after completing PT back in 04/2017. To be able to go to the grocery store by her self          [facility] PT Assessment - 10/06/17 1013      Observation/Other Assessments   Observations  no R groin pain with cobbler yoga poses post Tx       Palpation   Spinal  mobility  L thoracolumbar more posterior    SI assessment   R PSIS more anterior                Pelvic Floor Special Questions - 10/06/17 1012    External Perineal Exam  without clothing with consent     External Palpation  tightness/ fascial restrictions at ischial tuberosity medial aspect, pubic tubercle, and symphysis R     Pelvic Floor Internal Exam  --    Exam Type  --    Strength  --        [facility] Adult PT Treatment/Exercise - 10/06/17 1104      Manual Therapy   Manual therapy comments  fascial release at ischial tuberosity medial aspect, pubic tubercle, and symphysis R             PT Education - 10/06/17 1100    Education provided  Yes    Education Details  HEP    Person(s) Educated  Patient    Methods  Explanation;Demonstration;Tactile cues;Verbal cues;Handout    Comprehension  Returned demonstration;Verbalized understanding          PT Long Term Goals -  09/29/17 2349      PT LONG TERM GOAL #1   Title  Pt will report being able to stand > 30 min without radiating pain nor < 2/10 pain at SIJ in order to stand and shop     Time  12    Period  Weeks    Status  On-going      PT LONG TERM GOAL #2   Title  Pt will be able to perform household chores with proper body mechanics     Time  6    Period  Weeks    Status  On-going      PT LONG TERM GOAL #3   Title  Pt will be able to modify yoga poses and perform classes across 1 month without pain     Time  12    Period  Weeks    Status  On-going      PT LONG TERM GOAL #4   Title  Pt will decrease his ODI score  42% to <32 % in order to restore QOL and ADLs    Time  8    Period  Weeks    Status  On-going      PT LONG TERM GOAL #5   Title  Pt will decrease her NDI score  34% to <24 % in order to restore QOL and ADLs    Time  8    Period  Weeks    Status  On-going      PT LONG TERM GOAL #6   Title  Pt will increase her Cervical ROM from sidebend B 30 deg > 45 deg, rotation L 30 deg to > 45 deg      Time  4    Period  Weeks    Status  On-going      PT LONG TERM GOAL #7   Title  Pt will increase her gait speed from 0.8 m/s to> 1.2 m/s in order to ambulate in the community safely    Time  -- 12    Period  Weeks    Status  On-going      PT LONG TERM GOAL #8   Title  Pt will demo no pelvic obliquties and normal SIJ mobility and gait across 2 visits in order to decrease pain and return to ADLs    Time  2    Period  Weeks    Status  Achieved      PT LONG TERM GOAL  #9   TITLE  Pt will demo improved balance with faster stepping strategies with perturbations at waist/ cues for headturns in order to minimize risk for falls     Time  4    Period  Weeks    Status  New      PT LONG TERM GOAL  #10   TITLE  Pt will demo increased DF on L foot from 7 deg to > 15 deg and improved in PF strength from double heel raises to single raises Grade 2+ for 5+ reps B with single UE on wall  in order to progress to stairclimbing/ descent with less difficulty and hip pain.     Time  10    Period  Weeks    Status  New      PT LONG TERM GOAL  #11   TITLE  Pt will demo no pelvic floor tightness/ tenderness across 2 visits in order to climb/lower stairs with single UE , step through p  attern with less difficulty and pain    Time  4    Period  Weeks            Plan - 10/06/17 1105    Clinical Impression Statement Pt showed good carry over with improved gait stability and stance/push off phase.  Pt reported less pain at SIJ area from 3/10 to 0/10 post Tx. Pt also reported no R groin pain with cobbler yoga pose post Tx. External manual Tx addressed tightness/ tenderness along R ischial tuberosity/ pubic tubercle/ symphysis.  Anticipate this will help yield long term changes to her pelvic obliquties from her fall 6 months while stepping off  a curb with L foot.   Added SIJ stretch to HEP. Plan to address scoliotic curve with customized exercise to address neck issue and maintain improvements with  sacroiliac joint and pelvic girdle. Pt continues to benefit from skilled PT with decreasing frequency 1 xweek as pt is improving and has returned to increased activities.      Rehab Potential  Good    PT Frequency  1x / week    PT Duration  12 weeks    PT Treatment/Interventions  Aquatic Therapy;Electrical Stimulation;Iontophoresis 45m/ml Dexamethasone;Therapeutic activities;Therapeutic exercise;Neuromuscular re-education;Patient/family education;Manual techniques;Dry needling;Functional mobility training;Stair training;Moist Heat;Manual lymph drainage;Gait training;Scar mobilization    Consulted and Agree with Plan of Care  Patient       Patient will benefit from skilled therapeutic intervention in order to improve the following deficits and impairments:  Pain, Difficulty walking, Decreased strength, Increased fascial restricitons, Decreased range of motion, Decreased safety awareness, Decreased coordination, Postural dysfunction, Improper body mechanics, Decreased scar mobility  Visit Diagnosis: Muscle weakness (generalized)  Chronic low back pain without sciatica, unspecified back pain laterality  Sacrococcygeal disorders, not elsewhere classified  Neck pain, bilateral  Radiculopathy, lumbar region  Chronic bilateral low back pain, with sciatica presence unspecified     Problem List Patient Active Problem List   Diagnosis Date Noted  . Cholecystitis with cholelithiasis 02/27/2016  . Increased frequency of urination 01/27/2015  . History of night sweats 05/01/2014  . Dupuytren's contracture 07/18/2013  . Inverted nipple 04/18/2013  . Depression 04/23/2010  . Fibromyalgia 04/23/2010  . Hypercholesterolemia 04/23/2010  . Hypertension, benign 04/23/2010  . Hypothyroidism 04/23/2010  . Irritable colon 04/23/2010  . Osteoarthritis 04/23/2010    YJerl Mina,PT, DPT, E-RYT  10/06/2017, 11:09 AM  [facility]MAIN [facility] [address] [place] [address]Phone: [phone]  Fax:  [phone] Name: Kendra RHAMESMRN: [id number] of Birth: 6February 04, 1942

## 2017-10-06 NOTE — Patient Instructions (Signed)
Groin and sacroliac area   Standing:  10 reps on both sides x 3 x day     3 point tap  With [place]  ( standing knee is unlocked)   Feet are hip width Tap forward, center under hip not feet next to each other  Tap middle\, center  Tap back    ______  Yoga belt at the doorknob abd chair in front  Belt at waist, knees unlocked tensioned to feel the belt pulling your hips back while feet and legs are engaged downward  Trunk is parallel to floor to not hurt the back , hands at top of chair back 5 breaths   minisquat ,. Hands on hips to stand up

## 2017-10-07 ENCOUNTER — Encounter: Payer: Medicare Other | Admitting: Physical Therapy

## 2017-10-14 ENCOUNTER — Ambulatory Visit: Payer: Medicare Other | Attending: Internal Medicine | Admitting: Physical Therapy

## 2017-10-14 DIAGNOSIS — M542 Cervicalgia: Secondary | ICD-10-CM | POA: Insufficient documentation

## 2017-10-14 DIAGNOSIS — M545 Low back pain, unspecified: Secondary | ICD-10-CM

## 2017-10-14 DIAGNOSIS — M5416 Radiculopathy, lumbar region: Secondary | ICD-10-CM | POA: Insufficient documentation

## 2017-10-14 DIAGNOSIS — M6281 Muscle weakness (generalized): Secondary | ICD-10-CM

## 2017-10-14 DIAGNOSIS — M533 Sacrococcygeal disorders, not elsewhere classified: Secondary | ICD-10-CM | POA: Insufficient documentation

## 2017-10-14 DIAGNOSIS — G8929 Other chronic pain: Secondary | ICD-10-CM

## 2017-10-14 NOTE — Therapy (Signed)
[place] MAIN [facility] SERVICES [address] [place], [place], [address] Phone: [phone]   Fax:  [phone]  Patient Details  Name: Kendra Benton MRN: [id number] Date of Birth: 02-04-1941 Referring Provider:  Jacklynn Barnacle,*  Encounter Date: 10/14/2017  NOTE  Physical therapy was withheld today.   Pt reported she is starting to notice she is having random headaches recently but it has been happening for several months. Pt is feeling more fatigue.  Today, the headache feels more intense than than usual. It hurts across her forehead.  One week ago, pt experienced sudden numbness in her L hand.    Concerns:  Hx of heart attack 1997.  In Jan, pt had ER visit with c/o chest pain. In her cardiology report with Dr.Paraschos from 10/06/17,  "On 06/11/2017, the patient was at the grocery store when she acutely developed centralized chest discomfort described as sharp and spasm-like, causing her to "double over" with radiation to her neck without associated shortness of breath, nausea, or diaphoresis."  Pt also has reported that she had an acoustic neuroma ( which was not indicated in her notes) and she has been told to get updated MRI every couple of years. She stated she has been intending to f/u with MRI.    Today, pt appeared with slowed gait, fully conscious but requested to lay down upon arriving to office due to fatigue.  BP laying down: 150/65 mm Hg. 55 bpm - HR.    PT phoned Dr. Steward Drone office ( PCP) and spoke with his  nurse who was able to work pt into an appt at their same day clinic at 3pm this afternoon 10/14/17.  PT asked pt to phone her husband to drive her to the [facility] clinic in [place]. Pt's husband voiced he is on his way to meet her in the [facility] parking lot.  Pt was escorted in a WC to [place] parking where her car was delivered and she drove her car to a parking spot.   Jerl Mina ,PT, DPT, E-RYT  10/14/2017, 1:56 PM  [place] MAIN [facility] SERVICES [address] [place], [place], [address] Phone: [phone]   Fax:  [phone]

## 2017-10-21 ENCOUNTER — Ambulatory Visit: Payer: Medicare Other | Admitting: Physical Therapy

## 2017-10-22 ENCOUNTER — Telehealth: Payer: Self-pay | Admitting: Physical Therapy

## 2017-10-22 NOTE — Telephone Encounter (Signed)
PT f/u with pt to make sure pt has been following up with her MD about her medical Sx. Pt states she had been in communication with her PCP.

## 2017-10-28 ENCOUNTER — Encounter: Payer: Medicare Other | Admitting: Physical Therapy

## 2017-11-03 ENCOUNTER — Encounter: Payer: Medicare Other | Admitting: Physical Therapy

## 2017-11-09 ENCOUNTER — Ambulatory Visit: Payer: Medicare Other | Attending: Internal Medicine | Admitting: Physical Therapy

## 2017-11-09 DIAGNOSIS — M542 Cervicalgia: Secondary | ICD-10-CM | POA: Diagnosis present

## 2017-11-09 DIAGNOSIS — M545 Low back pain, unspecified: Secondary | ICD-10-CM

## 2017-11-09 DIAGNOSIS — M533 Sacrococcygeal disorders, not elsewhere classified: Secondary | ICD-10-CM | POA: Diagnosis present

## 2017-11-09 DIAGNOSIS — G8929 Other chronic pain: Secondary | ICD-10-CM | POA: Insufficient documentation

## 2017-11-09 DIAGNOSIS — M5416 Radiculopathy, lumbar region: Secondary | ICD-10-CM

## 2017-11-09 DIAGNOSIS — M6281 Muscle weakness (generalized): Secondary | ICD-10-CM | POA: Diagnosis present

## 2017-11-10 NOTE — Therapy (Addendum)
[place] MAIN [facility] SERVICES [address] [place], [place], [address] Phone: [phone]   Fax:  [phone]  Physical Therapy Treatment/ Discharge Summary   Patient Details  Name: Kendra Benton MRN: [id number] Date of Birth: 1940/08/19 Referring Provider: Sharia Reeve    Encounter Date: 11/09/2017     PT End of Session - 11/10/17 2223    Visit Number  8    Date for PT Re-Evaluation  10/26/17    PT Start Time  1405    PT Stop Time  1500    PT Time Calculation (min)  55 min    Activity Tolerance  Patient tolerated treatment well    Behavior During Therapy  [facility] for tasks assessed/performed       Past Medical History:  Diagnosis Date  . Allergy   . Hypertension   . Myocardial infarction ([place])    1997  . Osteoporosis     Past Surgical History:  Procedure Laterality Date  . ABDOMINAL HYSTERECTOMY     patient has ovaries  . APPENDECTOMY    . CHOLECYSTECTOMY N/A 02/28/2016   Procedure: LAPAROSCOPIC CHOLECYSTECTOMY WITH INTRAOPERATIVE CHOLANGIOGRAM;  Surgeon: Dia Crawford III, MD;  Location: [facility] ORS;  Service: General;  Laterality: N/A;  . LUMBAR FUSION  11/09/2016   L3-L5  . SPINE SURGERY     Disc    There were no vitals filed for this visit.  Subjective Assessment - 11/11/17 1045    Subjective  Pt reports her energy is very low and she feels she could hardly drag herself around.  Pt has noticed sideeffects to the antibiotic she took ( prescribed by  Dr. Sharia Reeve for the headaches) and she is noticing itchiness all over her skin and also fatigue.  Pt will be seeing her Allergy Doctor this week who is monitoring her acoustic neuroma . Pt will see her spine specialist on the 11th. Pt has been able to go and down her stairs and walk on the beach one month ago with PT treatments. At this time, pt was feeling much better and had energy.  Pt recently came back from 2 trips ( 6 hours, 5 hours one way commute from [place], [place]).  She noticed she was  having more difficutly with stairs again. Pt did some basic stretching and clam shells while on this trip. Pt has not been going to yoga for one month. Pt has also had a sinus infection and a stomache bug and the two infections "knocked me out".  Pt noticed her legs are hurting all the time like they are achey but they do not give way on her. Pt has no LOB nor dizziness. Pt reports " a little unsteadiness".  Pt has had sweats and chills for one year but she was prescribed Estradiol which helped. But when the sinus infection came on, sweats and chills also came.         Pertinent History  Pt feels tired all the time. "I feel like I am living a half-live and I feel depressed."     Patient Stated Goals  to feel normal like she did after completing PT back in 04/2017. To be able to go to the grocery store by her self          [facility] PT Assessment - 11/11/17 1046      AROM   Overall AROM   -- cervical ROM WFL,Pressure and HA w/ retraction/rotation/flex      Palpation   SI assessment  PSIS equal alignment                 Pelvic Floor Special Questions - 11/11/17 1053    Diastasis Recti  no fingers separation        BP 111/48 mm Hg in supine  57bpm HR          PT Education - 11/10/17 2237    Education provided  Yes    Education Details  d/c    Person(s) Educated  Patient    Methods  Explanation;Demonstration;Tactile cues;Verbal cues      PT Long Term Goals - 11/11/17 1057      PT LONG TERM GOAL #1   Title  Pt will report being able to stand > 30 min without radiating pain nor < 2/10 pain at SIJ in order to stand and shop     Time  12    Period  Weeks    Status  Achieved      PT LONG TERM GOAL #2   Title  Pt will be able to perform household chores with proper body mechanics     Time  6    Period  Weeks    Status  Achieved      PT LONG TERM GOAL #3   Title  Pt will be able to modify yoga poses and perform classes across 1 month without pain     Time  12     Period  Weeks    Status  Achieved      PT LONG TERM GOAL #4   Title  Pt will decrease his ODI score  42% to <32 % in order to restore QOL and ADLs    Time  8    Period  Weeks    Status  Unable to assess      PT LONG TERM GOAL #5   Title  Pt will decrease her NDI score  34% to <24 % in order to restore QOL and ADLs    Time  8    Period  Weeks    Status  Unable to assess      PT LONG TERM GOAL #6   Title  Pt will increase her Cervical ROM from sidebend B 30 deg > 45 deg, rotation L 30 deg to > 45 deg     Time  4    Period  Weeks    Status  Not Met      PT LONG TERM GOAL #7   Title  Pt will increase her gait speed from 0.8 m/s to> 1.2 m/s in order to ambulate in the community safely    Time  -- 12    Period  Weeks    Status  Unable to assess      PT LONG TERM GOAL #8   Title  Pt will demo no pelvic obliquties and normal SIJ mobility and gait across 2 visits in order to decrease pain and return to ADLs    Time  2    Period  Weeks    Status  Achieved      PT LONG TERM GOAL  #9   TITLE  Pt will demo improved balance with faster stepping strategies with perturbations at waist/ cues for headturns in order to minimize risk for falls     Time  4    Period  Weeks    Status  Unable to assess      PT LONG TERM GOAL  #10  TITLE  Pt will demo increased DF on L foot from 7 deg to > 15 deg and improved in PF strength from double heel raises to single raises Grade 2+ for 5+ reps B with single UE on wall  in order to progress to stairclimbing/ descent with less difficulty and hip pain.     Time  10    Period  Weeks    Status  Achieved      PT LONG TERM GOAL  #11   TITLE  Pt will demo no pelvic floor tightness/ tenderness across 2 visits in order to climb/lower stairs with single UE , step through p attern with less difficulty and pain    Time  4    Period  Weeks    Status  Achieved               Plan - 11/11/17 1055    Clinical Impression Statement Pt has achieved 6/11  goals across the past 8 sessions. Remaining goals were not reassessed as main focus was focused on discussion about the timeline of her medical Sx which require continued communication with her providers.  Pt is getting d/c as pt require further medical work up.   PT update: Pt's pelvic alignment has been maintained since POC. Her gait and LE strength and lower kinetic chain deficits have improved.  Pelvic floor scar restrictions and mm tightness have decreased. Pt was able to demo proper pelvic floor strength and coordination to promote sacroiliac stability. Scoliosis specific exercises have been tailored for her leg length difference. One month ago after several visits, she experienced significantly less pain and increased strength which lead her to be able to return to shopping and walking on the beach. Pt achieved the ability of stair climbing and descending without R hip pain which was something she had not been able to do before.  However, pt has had a relapse. Her physical endurance and ability to climb stairs have been impacted by the worsening of her headaches, fatigue and getting out of her exercise routine due to 2 weeks of travel and not attending her yoga classes for one month.  These medical Sx include headaches and pressure at her temples which have progressively become worse especially over the past month.   The timeline that pt reported is detailed below: HA started over 4 months and has become more persistent and constant. In Feb, pt went to the ER for an anxiety attack. On May 8th during PT session without exertional/ strenuous activities, pt had high BP 150/65 mm Hg. 55 bpm - HR and was advised to seek medical attention with her PCP immediately. However, pt's PCP was not available to see her that day  and was referred to the walk-in clinic. Pt reported coming back from 2 trips  from [place], [place] within the past two weeks.  Over this period, pt has had a sinus infection and a stomache with diarrhea.  These two infections have "knocked [her] out".  Pt noticed her legs are hurting all the time like they are achey but they do not give way on her. Pt has no LOB nor dizziness. Pt reports " a little unsteadiness".  Pt has had sweats and chills for one year but she was prescribed Estradiol which helped. But when the sinus infection came on, sweats and chills started back. Pt was prescribed an anti-biotic by her PCP which she has completed but she has noticed some side-effects that include itchiness over the scalp and  body in addition tingling in her hands and feet.    Today, pt 's BP was 111/48 mm Hg  57bpm HR. Upon PT assessment, pt's HA and pressure in her temples were reproduced with neck movements but were not fitting into typical of musculoskeletal -based patterns.      Pt has an appointment with her ENT doctor Tami Ribas) this week and will f/u with her medical Hx of vestibular schwannoma with him. Updated MRI is likely warranted given her red flag symptoms.    Pt also will be seeing her pain medicine later this month.   Physical Therapist will be sending this note to Dr.McQueen, PCP, and pain doctor.        Rehab Potential  Good    PT Frequency  1x / week    PT Duration  12 weeks    PT Treatment/Interventions  Aquatic Therapy;Electrical Stimulation;Iontophoresis 61m/ml Dexamethasone;Therapeutic activities;Therapeutic exercise;Neuromuscular re-education;Patient/family education;Manual techniques;Dry needling;Functional mobility training;Stair training;Moist Heat;Manual lymph drainage;Gait training;Scar mobilization    Consulted and Agree with Plan of Care  Patient       Patient will benefit from skilled therapeutic intervention in order to improve the following deficits and impairments:  Pain, Difficulty walking, Decreased strength, Increased fascial restricitons, Decreased range of motion, Decreased safety awareness, Decreased coordination, Postural dysfunction, Improper body mechanics,  Decreased scar mobility  Visit Diagnosis: Muscle weakness (generalized)  Chronic low back pain without sciatica, unspecified back pain laterality  Sacrococcygeal disorders, not elsewhere classified  Neck pain, bilateral  Radiculopathy, lumbar region     Problem List Patient Active Problem List   Diagnosis Date Noted  . Cholecystitis with cholelithiasis 02/27/2016  . Increased frequency of urination 01/27/2015  . History of night sweats 05/01/2014  . Dupuytren's contracture 07/18/2013  . Inverted nipple 04/18/2013  . Depression 04/23/2010  . Fibromyalgia 04/23/2010  . Hypercholesterolemia 04/23/2010  . Hypertension, benign 04/23/2010  . Hypothyroidism 04/23/2010  . Irritable colon 04/23/2010  . Osteoarthritis 04/23/2010    YJerl Mina,PT, DPT, E-RYT  11/11/2017, 10:55 AM  [place]MAIN [facility] HospitalSERVICES [address] [address]Phone: [phone]  Fax:  [phone] Name: Kendra CAPTAINMRN: [id number] of Birth: 6October 17, 1942

## 2017-11-11 ENCOUNTER — Encounter: Payer: Self-pay | Admitting: Physical Therapy

## 2017-11-11 ENCOUNTER — Telehealth: Payer: Self-pay | Admitting: Physical Therapy

## 2017-11-11 ENCOUNTER — Other Ambulatory Visit: Payer: Self-pay | Admitting: Unknown Physician Specialty

## 2017-11-11 DIAGNOSIS — R519 Headache, unspecified: Secondary | ICD-10-CM

## 2017-11-11 DIAGNOSIS — H933X1 Disorders of right acoustic nerve: Secondary | ICD-10-CM

## 2017-11-11 DIAGNOSIS — R51 Headache: Principal | ICD-10-CM

## 2017-11-11 NOTE — Therapy (Signed)
[place] MAIN [facility] SERVICES [place], [place], [address] Phone: [phone]   Fax:  [phone]  Patient Details  Name: Kendra Benton MRN: [id number] Date of Birth: [birth year] Referring Provider: Sharia Reeve Encounter Date: 11/11/2017  PT routed pt's note re: need for medical work-up to her pain MD, PCP, and ENT MD:   Dear Dr. Tami Ribas, Colbert Coyer, [place],  Attached is a note for one of your patient who received pelvic physical therapy from me for chronic SIJ and R hip pain. While pt has made progress with increased endurance and strength to walk, shop, and climb stairs, pt has had a relapse. Pt has had ongoing HA, diarrhea, fatigue, fluctuating BP, and sweats/ chills over the past month which have limited her physical activity. Pt would benefit from a medical work up for these red-flag symptoms. Pt is getting d/c at this time. I understand she has appointments with each of you in the upcoming weeks. I hope this correspondence will help pt to receive a patient-centered interdisciplinary approach to her care.  Sincerely,  Jerl Mina, PT, DPT, E-RYT Pelvic Health Physical Therapist [email or web address] [phone]        Jerl Mina 11/11/2017, 2:02 PM  [place] MAIN [facility] SERVICES [address] [place], [place], [address] Phone: [phone]   Fax:  [phone]

## 2017-11-11 NOTE — Patient Instructions (Signed)
Return to seated marching 3' to rebuild energy and aerobic activity ( seated to promote safety)   Follow up with medical providers about headaches and other symptoms   Discharge from PT today

## 2017-11-11 NOTE — Telephone Encounter (Signed)
Physical therapist left a message on the voicemail of the clinical line at Dr. Tami Ribas ( Pt's ENT MD) re: pt's medical Sx that require further immediate medical work-up due to her cluster of red flag Sx. Pt is scheduled with MD today.

## 2017-11-13 NOTE — Addendum Note (Signed)
Addended by: Jerl Mina on: 11/13/2017 05:38 PM   Modules accepted: Orders

## 2017-11-24 ENCOUNTER — Ambulatory Visit
Admission: RE | Admit: 2017-11-24 | Discharge: 2017-11-24 | Disposition: A | Discharge status: Home / Self Care | Payer: Medicare Other | Source: Ambulatory Visit | Attending: Unknown Physician Specialty | Admitting: Unknown Physician Specialty

## 2017-11-24 DIAGNOSIS — H933X1 Disorders of right acoustic nerve: Secondary | ICD-10-CM | POA: Diagnosis not present

## 2017-11-24 DIAGNOSIS — G930 Cerebral cysts: Secondary | ICD-10-CM | POA: Diagnosis not present

## 2017-11-24 DIAGNOSIS — R9082 White matter disease, unspecified: Secondary | ICD-10-CM | POA: Insufficient documentation

## 2017-11-24 DIAGNOSIS — R519 Headache, unspecified: Secondary | ICD-10-CM

## 2017-11-24 DIAGNOSIS — G319 Degenerative disease of nervous system, unspecified: Secondary | ICD-10-CM | POA: Insufficient documentation

## 2017-11-24 DIAGNOSIS — R51 Headache: Secondary | ICD-10-CM | POA: Insufficient documentation

## 2017-11-24 LAB — POCT I-STAT CREATININE: Creatinine, Ser: 0.8 mg/dL (ref 0.44–1.00)

## 2017-11-24 MED ORDER — GADOBENATE DIMEGLUMINE 529 MG/ML IV SOLN
15.0000 mL | Freq: Once | INTRAVENOUS | Status: AC | PRN
  Administered 2017-11-24: 12 mL via INTRAVENOUS

## 2017-11-30 ENCOUNTER — Other Ambulatory Visit
Admission: RE | Admit: 2017-11-30 | Discharge: 2017-11-30 | Disposition: A | Discharge status: Home / Self Care | Payer: Medicare Other | Source: Ambulatory Visit | Attending: Ophthalmology | Admitting: Ophthalmology

## 2017-11-30 DIAGNOSIS — M316 Other giant cell arteritis: Secondary | ICD-10-CM | POA: Diagnosis present

## 2017-11-30 LAB — CBC WITH DIFFERENTIAL/PLATELET
BASOS ABS: 0.1 10*3/uL (ref 0–0.1)
Basophils Relative: 1 %
Eosinophils Absolute: 0.1 10*3/uL (ref 0–0.7)
Eosinophils Relative: 1 %
HEMATOCRIT: 39.2 % (ref 35.0–47.0)
Hemoglobin: 13.4 g/dL (ref 12.0–16.0)
LYMPHS ABS: 2.2 10*3/uL (ref 1.0–3.6)
LYMPHS PCT: 29 %
MCH: 33.1 pg (ref 26.0–34.0)
MCHC: 34.3 g/dL (ref 32.0–36.0)
MCV: 96.4 fL (ref 80.0–100.0)
MONO ABS: 0.6 10*3/uL (ref 0.2–0.9)
Monocytes Relative: 8 %
NEUTROS ABS: 4.6 10*3/uL (ref 1.4–6.5)
Neutrophils Relative %: 61 %
Platelets: 353 10*3/uL (ref 150–440)
RBC: 4.06 MIL/uL (ref 3.80–5.20)
RDW: 13.3 % (ref 11.5–14.5)
WBC: 7.6 10*3/uL (ref 3.6–11.0)

## 2017-11-30 LAB — SEDIMENTATION RATE: Sed Rate: 35 mm/hr — ABNORMAL HIGH (ref 0–30)

## 2017-11-30 LAB — C-REACTIVE PROTEIN: CRP: 1.9 mg/dL — ABNORMAL HIGH (ref <1.0)

## 2017-12-03 ENCOUNTER — Ambulatory Visit (INDEPENDENT_AMBULATORY_CARE_PROVIDER_SITE_OTHER): Payer: Medicare Other | Admitting: Vascular Surgery

## 2017-12-03 ENCOUNTER — Encounter (INDEPENDENT_AMBULATORY_CARE_PROVIDER_SITE_OTHER): Payer: Self-pay | Admitting: Vascular Surgery

## 2017-12-03 VITALS — BP 146/67 | HR 59 | Resp 13 | Ht 64.0 in | Wt 133.0 lb

## 2017-12-03 DIAGNOSIS — G8929 Other chronic pain: Secondary | ICD-10-CM

## 2017-12-03 DIAGNOSIS — M1990 Unspecified osteoarthritis, unspecified site: Secondary | ICD-10-CM | POA: Diagnosis not present

## 2017-12-03 DIAGNOSIS — I10 Essential (primary) hypertension: Secondary | ICD-10-CM | POA: Diagnosis not present

## 2017-12-03 DIAGNOSIS — R519 Headache, unspecified: Secondary | ICD-10-CM

## 2017-12-03 DIAGNOSIS — R51 Headache: Secondary | ICD-10-CM

## 2017-12-03 DIAGNOSIS — E7800 Pure hypercholesterolemia, unspecified: Secondary | ICD-10-CM | POA: Diagnosis not present

## 2017-12-03 NOTE — Progress Notes (Signed)
MRN : [id number]  Kendra Benton is a 77 y.o. (1941-06-04) female who presents with chief complaint of  Chief Complaint  Patient presents with  . New Patient (Initial Visit)    Temporal Biopsy  .  History of Present Illness:    The patient is seen for evaluation of headache.  She describes it as being present almost always but the intensity varies throughout the day or night.  It is located in the temporal area sometime radiating across the forehead.  Some associated photophobia and some nausea.  Alleviating factor is rest and covering her eyes from light.  Sunshine makes it worse.  She was seen by her ENT and Opthalmologist : MRI no acute changes, no glaucoma.  ESR is very slightly elevated as is the CRP  No history of back problems or DJD of the lumbar sacral spine.   The patient denies changes in claudication symptoms or new rest pain symptoms.  No new ulcers or wounds of the foot.  The patient's blood pressure has been stable and relatively well controlled. The patient denies amaurosis fugax or recent TIA symptoms. There are no recent neurological changes noted. The patient denies history of DVT, PE or superficial thrombophlebitis. The patient denies recent episodes of angina or shortness of breath.   No outpatient medications have been marked as taking for the 12/03/17 encounter (Office Visit) with Delana Meyer, Dolores Lory, MD.    Past Medical History:  Diagnosis Date  . Allergy   . Hypertension   . Myocardial infarction ([place])    1997  . Osteoporosis     Past Surgical History:  Procedure Laterality Date  . ABDOMINAL HYSTERECTOMY     patient has ovaries  . APPENDECTOMY    . CHOLECYSTECTOMY N/A 02/28/2016   Procedure: LAPAROSCOPIC CHOLECYSTECTOMY WITH INTRAOPERATIVE CHOLANGIOGRAM;  Surgeon: Dia Crawford III, MD;  Location: [facility] ORS;  Service: General;  Laterality: N/A;  . LUMBAR FUSION  11/09/2016   L3-L5  . SPINE SURGERY     Disc    Social History Social History    Tobacco Use  . Smoking status: Former Smoker    Packs/day: 1.00    Years: 20.00    Pack years: 20.00    Types: Cigarettes  . Smokeless tobacco: Former Systems developer    Quit date: 10/10/1978  Substance Use Topics  . Alcohol use: Yes    Comment: 14 drinks per week  . Drug use: No    Family History Family History  Problem Relation Age of Onset  . Heart disease Mother   . Arthritis Mother   . Cancer Mother 26       lymphoma  . Heart disease Father   . Cancer Father 93       prostate  . Cancer Sister 70       pancreatic  No family history of bleeding/clotting disorders, porphyria or autoimmune disease   Allergies  Allergen Reactions  . Albuterol Palpitations  . Amlodipine     Other reaction(s): Other (See Comments) Gum swelling and dry mouth  . Latex Rash    May be more adhesive allergy than latex.  Never tested     REVIEW OF SYSTEMS (Negative unless checked)  Constitutional: _0 Weight loss  _1 Fever  _2 Chills Cardiac: _3 Chest pain   _4 Chest pressure   _5 Palpitations   _6 Shortness of breath when laying flat   _7 Shortness of breath with exertion. Vascular:  _8 Pain in legs with walking   _9 Pain in legs at rest  _10 History of  DVT   _0 Phlebitis   _1 Swelling in legs   _2 Varicose veins   _3 Non-healing ulcers Pulmonary:   _4 Uses home oxygen   _5 Productive cough   _6 Hemoptysis   _7 Wheeze  _8 COPD   _9 Asthma Neurologic:  _10 Dizziness   _11 Seizures   _12 History of stroke   _13 History of TIA  _14 Aphasia   _15 Vissual changes   _16 Weakness or numbness in arm   _17 Weakness or numbness in leg Musculoskeletal:   _18 Joint swelling   _19 Joint pain   _20 Low back pain Hematologic:  _21 Easy bruising  _22 Easy bleeding   _23 Hypercoagulable state   _24 Anemic Gastrointestinal:  _25 Diarrhea   _26 Vomiting  _27 Gastroesophageal reflux/heartburn   _28 Difficulty swallowing. Genitourinary:  _29 Chronic kidney disease   _30 Difficult urination  _31 Frequent urination   _32 Blood in urine Skin:  _33 Rashes   _34 Ulcers  Psychological:   _35 History of anxiety   _36  History of major depression.  Physical Examination  Vitals:   12/03/17 1024  BP: (!) 146/67  Pulse: (!) 59  Resp: 13  Weight: 133 lb (60.3 kg)  Height: _37  (1.626 m)   Body mass index is 22.83 kg/m. Gen: WD/WN, NAD Head: [place]/AT, No temporalis wasting.  Ear/Nose/Throat: Hearing grossly intact, nares w/o erythema or drainage, poor dentition Eyes: PER, EOMI, sclera nonicteric.  Neck: Supple, no masses.  No bruit or JVD.  Pulmonary:  Good air movement, clear to auscultation bilaterally, no use of accessory muscles.  Cardiac: RRR, normal S1, S2, no Murmurs. Vascular: temporal area nontender to palpation no masses Vessel Right Left  Radial Palpable Palpable  Ulnar Palpable Palpable  Brachial Palpable Palpable  Carotid Palpable Palpable  Gastrointestinal: soft, non-distended. No guarding/no peritoneal signs.  Musculoskeletal: M/S 5/5 throughout.  No deformity or atrophy.  Neurologic: CN 2-12 intact. Pain and light touch intact in extremities.  Symmetrical.  Speech is fluent. Motor exam as listed above. Psychiatric: Judgment intact, Mood & affect appropriate for pt's clinical situation. Dermatologic: No rashes or ulcers noted.  No changes consistent with cellulitis. Lymph : No Cervical lymphadenopathy, no lichenification or skin changes of chronic lymphedema.  CBC Lab Results  Component Value Date   WBC 7.6 11/30/2017   HGB 13.4 11/30/2017   HCT 39.2 11/30/2017   MCV 96.4 11/30/2017   PLT 353 11/30/2017    BMET    Component Value Date/Time   NA 133 (L) 06/11/2017 1315   NA 130 (L) 06/23/2012 1038   K 3.4 (L) 06/11/2017 1315   K 3.7 06/23/2012 1038   CL 98 (L) 06/11/2017 1315   CL 93 (L) 06/23/2012 1038   CO2 21 (L) 06/11/2017 1315   CO2 31 06/23/2012 1038   GLUCOSE 119 (H) 06/11/2017 1315   GLUCOSE 103 (H) 06/23/2012 1038   BUN 26 (H) 06/11/2017 1315   BUN 17 06/23/2012 1038   CREATININE 0.80 11/24/2017 1738   CREATININE 0.71 12/27/2012  1459   CALCIUM 9.8 06/11/2017 1315   CALCIUM 9.2 06/23/2012 1038   GFRNONAA >60 06/11/2017 1315   GFRNONAA >60 12/27/2012 1459   GFRAA >60 06/11/2017 1315   GFRAA >60 12/27/2012 1459   Estimated Creatinine Clearance: 50 mL/min (by C-G formula based on SCr of 0.8 mg/dL).  COAG No results found for: INR, PROTIME  Radiology Mr Albertina [place] Wo Contrast  Result Date: 11/25/2017 CLINICAL DATA:  Headaches. Right-sided hearing loss. Disorder of the right acoustic nerve. EXAM: MRI HEAD WITHOUT AND WITH CONTRAST TECHNIQUE: Multiplanar, multiecho pulse sequences of the brain and surrounding structures were obtained without and with intravenous contrast.  CONTRAST:  54m MULTIHANCE GADOBENATE DIMEGLUMINE 529 MG/ML IV SOLN COMPARISON:  MRI brain and IAC 02/08/2015 12/10/2011. FINDINGS: Brain: Enhancing mass lesion of the right CP angle with extension into the right internal auditory canal is again demonstrated. The lesion measures 9.4 x 15.1 x 10.5 mm. This is not changed significantly from 02/08/2015. There is slight interval growth since 12/10/2011. There is no extension to the inner ear structures no focal enhancement is present within the cochlea or vestibule. There is no significant mass effect on the brainstem. No focal enhancement or mass lesion is present in the left CP angle. The left seventh and eighth cranial nerves are discretely visualized and within normal limits. Inner ear structures are normally formed bilaterally. Conventional imaging the brain demonstrates a similar pattern of scattered periventricular and subcortical T2 hyperintensities bilaterally. Persistent cavum septum pellucidum and very the is again noted. 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 No acute infarct, hemorrhage, or new mass lesion is present. Vascular: Flow is present in the major intracranial arteries. Skull and upper cervical spine: Skull base is within normal limits. Craniocervical  junction is normal. Sinuses/Orbits: The paranasal sinuses and mastoid air cells are clear. Globes and orbits are within normal limits. Bilateral lens replacements are noted. IMPRESSION: 1. 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 There is slight interval growth since 2013. 2. Stable atrophy and white matter disease. This is mildly advanced for age and likely reflects the sequela of chronic microvascular ischemia. 3. 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 Electronically Signed   By: CSan MorelleM.D.   On: 11/25/2017 07:31      Assessment/Plan 1. Chronic intractable headache, unspecified headache type Recommend:  Given the patient's chronic nature and very low elevatin of her inflammatory markers I do not feel tha temporal artery biopsy is needed at this time.  I would have her see neurology and have a work up for cluster headache/migraines.  [facility]  I can do a biopsy at any point especially if her symptoms worsen and this was discussed with her, she voiced agreement.  Continue antiplatelet therapy as prescribed Continue management of CAD, HTN and Hyperlipidemia Healthy heart diet,  encouraged exercise at least 4 times per week  2. Hypertension, benign Continue antihypertensive medications as already ordered, these medications have been reviewed and there are no changes at this time.   3. Osteoarthritis, unspecified osteoarthritis type, unspecified site Continue NSAID medications as already ordered, these medications have been reviewed and there are no changes at this time.  Continued activity and therapy was stressed.   4. Hypercholesterolemia Continue statin as ordered and reviewed, no changes at this time     GHortencia Pilar MD  12/03/2017 10:26 AM

## 2017-12-21 ENCOUNTER — Ambulatory Visit (INDEPENDENT_AMBULATORY_CARE_PROVIDER_SITE_OTHER): Payer: Medicare Other | Admitting: Vascular Surgery

## 2017-12-22 ENCOUNTER — Telehealth (INDEPENDENT_AMBULATORY_CARE_PROVIDER_SITE_OTHER): Payer: Self-pay

## 2017-12-22 NOTE — Telephone Encounter (Signed)
I spoke with the patient yesterday because she stated she was ready to have a temporal artery biopsy. Per Dr. Nino Parsley office note he suggested that the patient see her neurologist before a biopsy but that he could do a biopsy if her symptoms worsened. The patient stated yesterday that if she could not be scheduled for this week then she would find another doctor, today I had to explain that I was not able to schedule her for this week so she is in agreement with next Wednesday.

## 2017-12-24 ENCOUNTER — Other Ambulatory Visit (INDEPENDENT_AMBULATORY_CARE_PROVIDER_SITE_OTHER): Payer: Self-pay

## 2017-12-28 ENCOUNTER — Encounter
Admission: RE | Admit: 2017-12-28 | Discharge: 2017-12-28 | Disposition: A | Discharge status: Home / Self Care | Payer: Medicare Other | Source: Ambulatory Visit | Attending: Vascular Surgery | Admitting: Vascular Surgery

## 2017-12-28 ENCOUNTER — Other Ambulatory Visit: Payer: Self-pay

## 2017-12-28 DIAGNOSIS — Z79899 Other long term (current) drug therapy: Secondary | ICD-10-CM | POA: Diagnosis not present

## 2017-12-28 DIAGNOSIS — R51 Headache: Secondary | ICD-10-CM | POA: Diagnosis not present

## 2017-12-28 DIAGNOSIS — Z87891 Personal history of nicotine dependence: Secondary | ICD-10-CM | POA: Diagnosis not present

## 2017-12-28 DIAGNOSIS — I10 Essential (primary) hypertension: Secondary | ICD-10-CM | POA: Diagnosis not present

## 2017-12-28 DIAGNOSIS — E7800 Pure hypercholesterolemia, unspecified: Secondary | ICD-10-CM | POA: Diagnosis not present

## 2017-12-28 DIAGNOSIS — I252 Old myocardial infarction: Secondary | ICD-10-CM | POA: Diagnosis not present

## 2017-12-28 DIAGNOSIS — E039 Hypothyroidism, unspecified: Secondary | ICD-10-CM | POA: Diagnosis not present

## 2017-12-28 HISTORY — DX: Pneumonia, unspecified organism: J18.9

## 2017-12-28 HISTORY — DX: Headache: R51

## 2017-12-28 HISTORY — DX: Headache, unspecified: R51.9

## 2017-12-28 HISTORY — DX: Gastro-esophageal reflux disease without esophagitis: K21.9

## 2017-12-28 HISTORY — DX: Personal history of urinary calculi: Z87.442

## 2017-12-28 LAB — SURGICAL PCR SCREEN
MRSA, PCR: NEGATIVE
Staphylococcus aureus: NEGATIVE

## 2017-12-28 LAB — PROTIME-INR
INR: 0.97
PROTHROMBIN TIME: 12.8 s (ref 11.4–15.2)

## 2017-12-28 LAB — COMPREHENSIVE METABOLIC PANEL
ALT: 16 U/L (ref 0–44)
AST: 16 U/L (ref 15–41)
Albumin: 3.6 g/dL (ref 3.5–5.0)
Alkaline Phosphatase: 57 U/L (ref 38–126)
Anion gap: 7 (ref 5–15)
BILIRUBIN TOTAL: 0.6 mg/dL (ref 0.3–1.2)
BUN: 25 mg/dL — ABNORMAL HIGH (ref 8–23)
CO2: 26 mmol/L (ref 22–32)
CREATININE: 0.81 mg/dL (ref 0.44–1.00)
Calcium: 9.1 mg/dL (ref 8.9–10.3)
Chloride: 105 mmol/L (ref 98–111)
GFR calc Af Amer: >60 mL/min (ref >60)
Glucose, Bld: 90 mg/dL (ref 70–99)
Potassium: 3.7 mmol/L (ref 3.5–5.1)
Sodium: 138 mmol/L (ref 135–145)
TOTAL PROTEIN: 6.7 g/dL (ref 6.5–8.1)

## 2017-12-28 LAB — APTT: aPTT: 28 seconds (ref 24–36)

## 2017-12-28 NOTE — Patient Instructions (Signed)
Your procedure is scheduled on: Wednesday, December 30, 2017 Report to Day Surgery on the 2nd floor of the [facility]. To find out your arrival time, please call [phone] between 1PM - 3PM on: Tuesday, December 29, 2017  REMEMBER: Instructions that are not followed completely may result in serious medical risk, up to and including death; or upon the discretion of your surgeon and anesthesiologist your surgery may need to be rescheduled.  Do not eat food after midnight the night before your procedure.  No gum chewing, lozengers or hard candies.  You may however, drink CLEAR liquids up to 2 hours before you are scheduled to arrive for your surgery. Do not drink anything within 2 hours of the start of your surgery.  Clear liquids include: - water  - apple juice without pulp - clear gatorade - black coffee or tea (Do NOT add anything to the coffee or tea) Do NOT drink anything that is not on this list.  No Alcohol for 24 hours before or after surgery.  No Smoking including e-cigarettes for 24 hours prior to surgery.  No chewable tobacco products for at least 6 hours prior to surgery.  No nicotine patches on the day of surgery.  On the morning of surgery brush your teeth with toothpaste and water, you may rinse your mouth with mouthwash if you wish. Do not swallow any toothpaste or mouthwash.  Notify your doctor if there is any change in your medical condition (cold, fever, infection).  Do not wear jewelry, make-up, hairpins, clips or nail polish.  Do not wear lotions, powders, or perfumes. You may wear deodorant.  Do not shave 48 hours prior to surgery.   Contacts and dentures may not be worn into surgery.  Do not bring valuables to the hospital, including drivers license, insurance or credit cards.  [facility] is not responsible for any belongings or valuables.   TAKE THESE MEDICATIONS THE MORNING OF SURGERY:  1.  METOPROLOL 2.  OMEPRAZOLE (take one the night before surgery  and one on the morning of surgery - helps to prevent nausea)  NOW!  Stop Anti-inflammatories (NSAIDS) such as Advil, Aleve, Ibuprofen, Motrin, Naproxen, Naprosyn and Aspirin based products such as Excedrin, Goodys Powder, BC Powder. (May take Tylenol or Acetaminophen if needed.)  NOW!  Stop ANY OVER THE COUNTER supplements until after surgery. (PROBIOTIC)  Wear comfortable clothing (specific to your surgery type) to the hospital.  Plan for stool softeners for home use.  If you are being discharged the day of surgery, you will not be allowed to drive home. You will need a responsible adult to drive you home and stay with you that night.   If you are taking public transportation, you will need to have a responsible adult with you. Please confirm with your physician that it is acceptable to use public transportation.   Please call [phone] if you have any questions about these instructions.

## 2017-12-29 MED ORDER — CEFAZOLIN SODIUM-DEXTROSE 1-4 GM/50ML-% IV SOLN
1.0000 g | Freq: Once | INTRAVENOUS | Status: AC
  Administered 2017-12-30: 1 g via INTRAVENOUS

## 2017-12-30 ENCOUNTER — Other Ambulatory Visit: Payer: Self-pay

## 2017-12-30 ENCOUNTER — Ambulatory Visit: Payer: Medicare Other | Admitting: Certified Registered"

## 2017-12-30 ENCOUNTER — Ambulatory Visit
Admission: RE | Admit: 2017-12-30 | Discharge: 2017-12-30 | Disposition: A | Discharge status: Home / Self Care | Payer: Medicare Other | Source: Ambulatory Visit | Attending: Vascular Surgery | Admitting: Vascular Surgery

## 2017-12-30 ENCOUNTER — Encounter: Payer: Self-pay | Admitting: *Deleted

## 2017-12-30 ENCOUNTER — Encounter
Admission: RE | Disposition: A | Discharge status: Home / Self Care | Payer: Self-pay | Source: Ambulatory Visit | Attending: Vascular Surgery

## 2017-12-30 DIAGNOSIS — Z79899 Other long term (current) drug therapy: Secondary | ICD-10-CM | POA: Insufficient documentation

## 2017-12-30 DIAGNOSIS — E039 Hypothyroidism, unspecified: Secondary | ICD-10-CM | POA: Insufficient documentation

## 2017-12-30 DIAGNOSIS — Z87891 Personal history of nicotine dependence: Secondary | ICD-10-CM | POA: Insufficient documentation

## 2017-12-30 DIAGNOSIS — I10 Essential (primary) hypertension: Secondary | ICD-10-CM | POA: Insufficient documentation

## 2017-12-30 DIAGNOSIS — R51 Headache: Secondary | ICD-10-CM | POA: Insufficient documentation

## 2017-12-30 DIAGNOSIS — E7800 Pure hypercholesterolemia, unspecified: Secondary | ICD-10-CM | POA: Insufficient documentation

## 2017-12-30 DIAGNOSIS — I252 Old myocardial infarction: Secondary | ICD-10-CM | POA: Insufficient documentation

## 2017-12-30 HISTORY — PX: ARTERY BIOPSY: SHX891

## 2017-12-30 SURGERY — BIOPSY TEMPORAL ARTERY
Anesthesia: General | Laterality: Right | Wound class: "Clean "

## 2017-12-30 MED ORDER — LIDOCAINE HCL (PF) 2 % IJ SOLN
INTRAMUSCULAR | Status: AC
  Filled 2017-12-30: qty 10

## 2017-12-30 MED ORDER — LIDOCAINE-EPINEPHRINE 1 %-1:100000 IJ SOLN
INTRAMUSCULAR | Status: AC
  Filled 2017-12-30: qty 1

## 2017-12-30 MED ORDER — FENTANYL CITRATE (PF) 100 MCG/2ML IJ SOLN
INTRAMUSCULAR | Status: AC
  Filled 2017-12-30: qty 2

## 2017-12-30 MED ORDER — HYDROCODONE-ACETAMINOPHEN 7.5-325 MG PO TABS: 1.0000 | ORAL_TABLET | Freq: Once | ORAL | Status: DC | PRN

## 2017-12-30 MED ORDER — PROPOFOL 500 MG/50ML IV EMUL
INTRAVENOUS | Status: DC | PRN
  Administered 2017-12-30: 55 ug/kg/min via INTRAVENOUS

## 2017-12-30 MED ORDER — ACETAMINOPHEN 160 MG/5ML PO SOLN
325.0000 mg | ORAL | Status: DC | PRN
  Filled 2017-12-30: qty 20.3

## 2017-12-30 MED ORDER — PROPOFOL 10 MG/ML IV BOLUS
INTRAVENOUS | Status: DC | PRN
  Administered 2017-12-30: 40 mg via INTRAVENOUS
  Administered 2017-12-30: 20 mg via INTRAVENOUS

## 2017-12-30 MED ORDER — PROMETHAZINE HCL 25 MG/ML IJ SOLN: 6.2500 mg | INTRAMUSCULAR | Status: DC | PRN

## 2017-12-30 MED ORDER — PROPOFOL 10 MG/ML IV BOLUS
INTRAVENOUS | Status: AC
  Filled 2017-12-30: qty 20

## 2017-12-30 MED ORDER — HYDROCODONE-ACETAMINOPHEN 5-325 MG PO TABS: 1.0000 | ORAL_TABLET | Freq: Four times a day (QID) | ORAL | 0 refills | Status: DC | PRN

## 2017-12-30 MED ORDER — ACETAMINOPHEN 325 MG PO TABS: 325.0000 mg | ORAL_TABLET | ORAL | Status: DC | PRN

## 2017-12-30 MED ORDER — SODIUM CHLORIDE 0.9 % IV SOLN
INTRAVENOUS | Status: DC
  Administered 2017-12-30: 20 mL/h via INTRAVENOUS

## 2017-12-30 MED ORDER — FENTANYL CITRATE (PF) 100 MCG/2ML IJ SOLN: 25.0000 ug | INTRAMUSCULAR | Status: DC | PRN

## 2017-12-30 MED ORDER — CEFAZOLIN SODIUM-DEXTROSE 1-4 GM/50ML-% IV SOLN
INTRAVENOUS | Status: AC
  Filled 2017-12-30: qty 50

## 2017-12-30 MED ORDER — BUPIVACAINE HCL (PF) 0.5 % IJ SOLN
INTRAMUSCULAR | Status: DC | PRN
  Administered 2017-12-30: 15 mL

## 2017-12-30 MED ORDER — BUPIVACAINE HCL (PF) 0.5 % IJ SOLN
INTRAMUSCULAR | Status: AC
  Filled 2017-12-30: qty 30

## 2017-12-30 MED ORDER — LIDOCAINE HCL (CARDIAC) PF 100 MG/5ML IV SOSY
PREFILLED_SYRINGE | INTRAVENOUS | Status: DC | PRN
  Administered 2017-12-30: 50 mg via INTRAVENOUS

## 2017-12-30 MED ORDER — LIDOCAINE-EPINEPHRINE 1 %-1:100000 IJ SOLN
INTRAMUSCULAR | Status: DC | PRN
  Administered 2017-12-30: 7 mL

## 2017-12-30 MED ORDER — FENTANYL CITRATE (PF) 100 MCG/2ML IJ SOLN
INTRAMUSCULAR | Status: DC | PRN
  Administered 2017-12-30: 25 ug via INTRAVENOUS
  Administered 2017-12-30: 50 ug via INTRAVENOUS
  Administered 2017-12-30: 25 ug via INTRAVENOUS

## 2017-12-30 SURGICAL SUPPLY — 39 items
BLADE CLIPPER SURG (BLADE) ×2 IMPLANT
BLADE SURG 15 STRL LF DISP TIS (BLADE) ×1 IMPLANT
BLADE SURG 15 STRL SS (BLADE) ×1
CNTNR SPEC 2.5X3XGRAD LEK (MISCELLANEOUS)
CONT SPEC 4OZ STER OR WHT (MISCELLANEOUS)
CONTAINER SPEC 2.5X3XGRAD LEK (MISCELLANEOUS) IMPLANT
COTTON BALL STRL MEDIUM (GAUZE/BANDAGES/DRESSINGS) ×2 IMPLANT
DERMABOND ADVANCED (GAUZE/BANDAGES/DRESSINGS) ×1
DERMABOND ADVANCED .7 DNX12 (GAUZE/BANDAGES/DRESSINGS) ×1 IMPLANT
DRAPE LAPAROTOMY 77X122 PED (DRAPES) ×2 IMPLANT
DRSG TELFA 4X3 1S NADH ST (GAUZE/BANDAGES/DRESSINGS) ×2 IMPLANT
ELECT CAUTERY BLADE 6.4 (BLADE) ×2 IMPLANT
ELECT REM PT RETURN 9FT ADLT (ELECTROSURGICAL) ×2
ELECTRODE REM PT RTRN 9FT ADLT (ELECTROSURGICAL) ×1 IMPLANT
GLOVE BIO SURGEON STRL SZ7 (GLOVE) ×2 IMPLANT
GLOVE INDICATOR 7.5 STRL GRN (GLOVE) ×2 IMPLANT
GLOVE SURG SYN 8.0 (GLOVE) ×2 IMPLANT
GLOVE SURG SYN 8.0 PF PI (GLOVE) ×1 IMPLANT
GOWN L4 XLG 20 PK N/S (GOWN DISPOSABLE) ×2 IMPLANT
GOWN STRL REUS W/ TWL LRG LVL3 (GOWN DISPOSABLE) ×2 IMPLANT
GOWN STRL REUS W/TWL LRG LVL3 (GOWN DISPOSABLE) ×2
LABEL OR SOLS (LABEL) ×2 IMPLANT
NDL HYPO 25X1 1.5 SAFETY (NEEDLE) ×2 IMPLANT
NEEDLE HYPO 25X1 1.5 SAFETY (NEEDLE) ×4 IMPLANT
NS IRRIG 500ML POUR BTL (IV SOLUTION) ×2 IMPLANT
PACK BASIN MINOR ARMC (MISCELLANEOUS) ×2 IMPLANT
SOL PREP PVP 2OZ (MISCELLANEOUS) ×2
SOLUTION PREP PVP 2OZ (MISCELLANEOUS) ×1 IMPLANT
SUCTION FRAZIER HANDLE 10FR (MISCELLANEOUS) ×1
SUCTION TUBE FRAZIER 10FR DISP (MISCELLANEOUS) ×1 IMPLANT
SUT MNCRL AB 4-0 PS2 18 (SUTURE) ×2 IMPLANT
SUT SILK 3 0 (SUTURE) ×1
SUT SILK 3-0 18XBRD TIE 12 (SUTURE) ×1 IMPLANT
SUT SILK 4 0 (SUTURE) ×1
SUT SILK 4-0 18XBRD TIE 12 (SUTURE) ×1 IMPLANT
SUT VIC AB 3-0 SH 27 (SUTURE) ×1
SUT VIC AB 3-0 SH 27X BRD (SUTURE) ×1 IMPLANT
SYR 10ML LL (SYRINGE) ×4 IMPLANT
SYR BULB IRRIG 60ML STRL (SYRINGE) ×2 IMPLANT

## 2017-12-30 NOTE — Anesthesia Post-op Follow-up Note (Signed)
Anesthesia QCDR form completed.        

## 2017-12-30 NOTE — Transfer of Care (Signed)
Immediate Anesthesia Transfer of Care Note  Patient: Kendra Benton  Procedure(s) Performed: BIOPSY TEMPORAL ARTERY (Right )  Patient Location: PACU  Anesthesia Type:General  Level of Consciousness: awake, alert  and responds to stimulation  Airway & Oxygen Therapy: Patient Spontanous Breathing and Patient connected to nasal cannula oxygen  Post-op Assessment: Report given to RN and Post -op Vital signs reviewed and stable  Post vital signs: Reviewed and stable  Last Vitals:  Vitals Value Taken Time  BP 117/53 12/30/2017  1:07 PM  Temp 36.4 C 12/30/2017  1:05 PM  Pulse 59 12/30/2017  1:07 PM  Resp 13 12/30/2017  1:07 PM  SpO2 100 % 12/30/2017  1:07 PM  Vitals shown include unvalidated device data.  Last Pain:  Vitals:   12/30/17 0911  TempSrc: Temporal      Patients Stated Pain Goal: 3 (39/53/20 2334)  Complications: No apparent anesthesia complications

## 2017-12-30 NOTE — H&P (Signed)
[facility] History & Physical Update  The patient was interviewed and re-examined.  The patient's previous History and Physical has been reviewed and is unchanged.  There is no change in the plan of care. We plan to proceed with the scheduled procedure.  Hortencia Pilar, MD  12/30/2017, 9:30 AM

## 2017-12-30 NOTE — Anesthesia Procedure Notes (Signed)
Performed by: Sole Lengacher, CRNA Pre-anesthesia Checklist: Patient identified, Emergency Drugs available, Suction available, Patient being monitored and Timeout performed Patient Re-evaluated:Patient Re-evaluated prior to induction Oxygen Delivery Method: Nasal cannula Induction Type: IV induction       

## 2017-12-30 NOTE — Op Note (Signed)
        OPERATIVE NOTE   PRE-OPERATIVE DIAGNOSIS: suspected temporal arteritis, headache  POST-OPERATIVE DIAGNOSIS: Same as above  PROCEDURE: 1.   Right temporal artery biopsy  SURGEON: Hortencia Pilar, MD  ASSISTANT(S): none  ANESTHESIA: general by LMA  ESTIMATED BLOOD LOSS: Minimal  FINDING(S): 1.  none  SPECIMEN(S):  Right  superficial temporal artery sent to pathology  INDICATIONS:   Patient is a 77 y.o. female who presents with headache and elevated ESR. Risks and benefits were discussed and he was agreeable to proceed.  DESCRIPTION: After obtaining full informed written consent, the patient was brought back to the operating room and placed supine upon the operating table.  The patient received IV antibiotics prior to induction.  After obtaining adequate anesthesia, the patient was prepped and draped in the standard fashion. The area in front of his right ear was anesthetized copiously with a solution of 1% lidocaine and half percent Marcaine without epinephrine. I then made an incision just in front of the right ear overlying the palpable pulse. I then dissected down through the subcutaneous tissues and identified the superficial temporal artery. This was dissected out over a several centimeters and branches were ligated and divided between silk ties. Care was used to avoid electrocautery around the artery. I then clamped the artery proximally and distally and transected the artery. The specimen was then sent to pathology. The proximal and distal artery were ligated with 3-0 silk ties. Hemostasis was achieved. The wound was then closed with a series of interrupted 3-0 Vicryl's and the skin was closed with a 4-0 Monocryl. Sterile dressing was placed. The patient was taken to the recovery room in stable condition having tolerated the procedure well.  COMPLICATIONS: None  CONDITION: Stable   Hortencia Pilar 12/30/2017 1:17 PM  This note was created with Dragon Medical  transcription system. Any errors in dictation are purely unintentional.

## 2017-12-30 NOTE — Discharge Instructions (Signed)

## 2017-12-30 NOTE — Anesthesia Preprocedure Evaluation (Signed)
Anesthesia Evaluation  Patient identified by MRN, date of birth, ID band Patient awake    Reviewed: Allergy & Precautions, H&P , NPO status , reviewed documented beta blocker date and time   Airway Mallampati: III  TM Distance: <3 FB Neck ROM: limited    Dental  (+) Chipped, Caps   Pulmonary pneumonia, resolved, former smoker,    Pulmonary exam normal        Cardiovascular hypertension, + Past MI  Normal cardiovascular exam     Neuro/Psych  Headaches, PSYCHIATRIC DISORDERS Depression  Neuromuscular disease    GI/Hepatic GERD  ,  Endo/Other  Hypothyroidism   Renal/GU      Musculoskeletal  (+) Arthritis , Fibromyalgia -  Abdominal   Peds  Hematology   Anesthesia Other Findings Past Medical History: No date: Allergy No date: GERD (gastroesophageal reflux disease) No date: Headache No date: History of kidney stones No date: Hypertension No date: Myocardial infarction [facility])     Comment:  1997 No date: Osteoporosis No date: Pneumonia  Past Surgical History: No date: ABDOMINAL HYSTERECTOMY     Comment:  patient has ovaries No date: APPENDECTOMY No date: BACK SURGERY No date: CARDIAC CATHETERIZATION 2010: CATARACT EXTRACTION W/ INTRAOCULAR LENS  IMPLANT, BILATERAL;  Bilateral 02/28/2016: CHOLECYSTECTOMY; N/A     Comment:  Procedure: LAPAROSCOPIC CHOLECYSTECTOMY WITH               INTRAOPERATIVE CHOLANGIOGRAM;  Surgeon: Dia Crawford III,               MD;  Location: [facility] ORS;  Service: General;  Laterality:               N/A; 11/09/2016: LUMBAR FUSION     Comment:  L3-L5 No date: SPINE SURGERY     Comment:  Disc No date: TONSILLECTOMY  BMI    Body Mass Index:  22.56 kg/m      Reproductive/Obstetrics                             Anesthesia Physical Anesthesia Plan  ASA: III  Anesthesia Plan: General   Post-op Pain Management:    Induction:   PONV Risk Score and Plan: 3 and  Treatment may vary due to age or medical condition, TIVA and Ondansetron  Airway Management Planned:   Additional Equipment:   Intra-op Plan:   Post-operative Plan:   Informed Consent: I have reviewed the patients History and Physical, chart, labs and discussed the procedure including the risks, benefits and alternatives for the proposed anesthesia with the patient or authorized representative who has indicated his/her understanding and acceptance.   Dental Advisory Given  Plan Discussed with: CRNA  Anesthesia Plan Comments:         Anesthesia Quick Evaluation

## 2017-12-31 LAB — SURGICAL PATHOLOGY

## 2018-01-06 NOTE — Anesthesia Postprocedure Evaluation (Signed)
Anesthesia Post Note  Patient: Kendra Benton  Procedure(s) Performed: BIOPSY TEMPORAL ARTERY (Right )  Patient location during evaluation: PACU Anesthesia Type: General Level of consciousness: awake and alert Pain management: pain level controlled Vital Signs Assessment: post-procedure vital signs reviewed and stable Respiratory status: spontaneous breathing, nonlabored ventilation, respiratory function stable and patient connected to nasal cannula oxygen Cardiovascular status: blood pressure returned to baseline and stable Postop Assessment: no apparent nausea or vomiting Anesthetic complications: no     Last Vitals:  Vitals:   12/30/17 1346 12/30/17 1410  BP: (!) 145/102 (!) 155/53  Pulse: (!) 55 (!) 50  Resp: 16   Temp: (!) 36.1 C   SpO2: 97% 99%    Last Pain:  Vitals:   12/31/17 0818  TempSrc:   PainSc: 0-No pain                 Alphonsus Sias

## 2018-01-14 ENCOUNTER — Ambulatory Visit (INDEPENDENT_AMBULATORY_CARE_PROVIDER_SITE_OTHER): Payer: Medicare Other | Admitting: Vascular Surgery

## 2018-01-14 ENCOUNTER — Encounter (INDEPENDENT_AMBULATORY_CARE_PROVIDER_SITE_OTHER): Payer: Self-pay | Admitting: Vascular Surgery

## 2018-01-14 VITALS — BP 139/67 | HR 62 | Resp 16 | Ht 64.0 in | Wt 133.8 lb

## 2018-01-14 DIAGNOSIS — G8929 Other chronic pain: Secondary | ICD-10-CM

## 2018-01-14 DIAGNOSIS — R519 Headache, unspecified: Secondary | ICD-10-CM

## 2018-01-14 DIAGNOSIS — R51 Headache: Secondary | ICD-10-CM

## 2018-01-30 ENCOUNTER — Encounter (INDEPENDENT_AMBULATORY_CARE_PROVIDER_SITE_OTHER): Payer: Self-pay | Admitting: Vascular Surgery

## 2018-01-30 NOTE — Progress Notes (Signed)
    Patient ID: Kendra Benton, female   DOB: [birth year], 77 y.o.   MRN: [id number]  Chief Complaint  Patient presents with  . Follow-up    [facility] 2wk temporal biopsy    HPI Kendra Benton is a 77 y.o. female.  Still has a headache  Denies pain at surgery site   Past Medical History:  Diagnosis Date  . Allergy   . GERD (gastroesophageal reflux disease)   . Headache   . History of kidney stones   . Hypertension   . Myocardial infarction ([place])    1997  . Osteoporosis   . Pneumonia     Past Surgical History:  Procedure Laterality Date  . ABDOMINAL HYSTERECTOMY     patient has ovaries  . APPENDECTOMY    . ARTERY BIOPSY Right 12/30/2017   Procedure: BIOPSY TEMPORAL ARTERY;  Surgeon: Katha Cabal, MD;  Location: [facility] ORS;  Service: Vascular;  Laterality: Right;  . BACK SURGERY    . CARDIAC CATHETERIZATION    . CATARACT EXTRACTION W/ INTRAOCULAR LENS  IMPLANT, BILATERAL Bilateral 2010  . CHOLECYSTECTOMY N/A 02/28/2016   Procedure: LAPAROSCOPIC CHOLECYSTECTOMY WITH INTRAOPERATIVE CHOLANGIOGRAM;  Surgeon: Dia Crawford III, MD;  Location: [facility] ORS;  Service: General;  Laterality: N/A;  . LUMBAR FUSION  11/09/2016   L3-L5  . SPINE SURGERY     Disc  . TONSILLECTOMY        Allergies  Allergen Reactions  . Levofloxacin Itching  . Albuterol Palpitations  . Amlodipine     Other reaction(s): Other (See Comments) Gum swelling and dry mouth  . Latex Rash    May be more adhesive allergy than latex.  Never tested    Current Outpatient Medications  Medication Sig Dispense Refill  . dicyclomine (BENTYL) 10 MG capsule Take 10 mg by mouth 3 (three) times daily as needed.    . DULoxetine (CYMBALTA) 60 MG capsule Take 60 mg by mouth every evening.     Marland Kitchen estradiol (ESTRACE) 0.5 MG tablet Take 2 tablets by mouth daily.    Marland Kitchen FIBER PO Take 4 tablets by mouth daily.    Marland Kitchen HYDROcodone-acetaminophen (NORCO) 5-325 MG tablet Take 1 tablet by mouth every 6 (six) hours as needed for  moderate pain or severe pain. 10 tablet 0  . metoprolol succinate (TOPROL-XL) 25 MG 24 hr tablet Take 25 mg by mouth daily.     Marland Kitchen omeprazole (PRILOSEC) 20 MG capsule Take 20 mg by mouth daily.    . Probiotic Product (PROBIOTIC PO) Take 1 capsule by mouth daily.    . temazepam (RESTORIL) 30 MG capsule Take 30 mg by mouth at bedtime.     . triamterene-hydrochlorothiazide (DYAZIDE) 37.5-25 MG per capsule Take 1 capsule by mouth at bedtime.      No current facility-administered medications for this visit.         Physical Exam BP 139/67 (BP Location: Left Arm)   Pulse 62   Resp 16   Ht 5\' 4"  (1.626 m)   Wt 133 lb 12.8 oz (60.7 kg)   BMI 22.97 kg/m  Gen:  WD/WN, NAD Skin: incision C/D/I     Assessment/Plan: 1. Chronic intractable headache, unspecified headache type Biopsy is negative Further work up per neurology      Hortencia Pilar 01/30/2018, 3:08 PM   This note was created with Dragon medical transcription system.  Any errors from dictation are unintentional.

## 2018-04-08 ENCOUNTER — Other Ambulatory Visit: Payer: Self-pay | Admitting: Neurology

## 2018-04-08 DIAGNOSIS — G4489 Other headache syndrome: Secondary | ICD-10-CM

## 2018-04-13 ENCOUNTER — Other Ambulatory Visit: Payer: Self-pay | Admitting: Neurology

## 2018-04-13 DIAGNOSIS — G4489 Other headache syndrome: Secondary | ICD-10-CM

## 2018-04-13 DIAGNOSIS — R700 Elevated erythrocyte sedimentation rate: Secondary | ICD-10-CM

## 2018-04-27 ENCOUNTER — Encounter (INDEPENDENT_AMBULATORY_CARE_PROVIDER_SITE_OTHER): Payer: Self-pay

## 2018-06-14 ENCOUNTER — Encounter (INDEPENDENT_AMBULATORY_CARE_PROVIDER_SITE_OTHER): Payer: Self-pay | Admitting: Vascular Surgery

## 2018-06-14 ENCOUNTER — Ambulatory Visit (INDEPENDENT_AMBULATORY_CARE_PROVIDER_SITE_OTHER): Payer: Medicare Other | Admitting: Vascular Surgery

## 2018-06-14 VITALS — BP 163/63 | HR 56 | Resp 18 | Ht 64.0 in | Wt 136.0 lb

## 2018-06-14 DIAGNOSIS — M1990 Unspecified osteoarthritis, unspecified site: Secondary | ICD-10-CM | POA: Diagnosis not present

## 2018-06-14 DIAGNOSIS — R519 Headache, unspecified: Secondary | ICD-10-CM | POA: Insufficient documentation

## 2018-06-14 DIAGNOSIS — E7800 Pure hypercholesterolemia, unspecified: Secondary | ICD-10-CM | POA: Diagnosis not present

## 2018-06-14 DIAGNOSIS — I10 Essential (primary) hypertension: Secondary | ICD-10-CM

## 2018-06-14 DIAGNOSIS — R51 Headache: Secondary | ICD-10-CM

## 2018-06-14 DIAGNOSIS — G8929 Other chronic pain: Secondary | ICD-10-CM | POA: Insufficient documentation

## 2018-06-14 NOTE — Progress Notes (Signed)
MRN : [id number]  Kendra Benton is a 78 y.o. ([birth year]) female who presents with chief complaint of  Chief Complaint  Patient presents with  . Follow-up  .  History of Present Illness:   The patient is seen for evaluation of headache.  She describes it as being present almost always but the intensity varies throughout the day or night.  It is located in the temporal area sometime radiating across the forehead.  Some associated photophobia and some nausea.  Alleviating factor is rest and covering her eyes from light.  Sunshine makes it worse.  She was seen by her ENT and Opthalmologist : MRI no acute changes, no glaucoma.  ESR is very slightly elevated as is the CRP.  She is status post right temporal artery biopsy in July 2019.  Surgery was without incident or complication.  Biopsy was negative for arteritis.  Since that time she has not had a formal trial on steroids.  No history of back problems or DJD of the lumbar sacral spine.   The patient denies changes in claudication symptoms or new rest pain symptoms.  No new ulcers or wounds of the foot.  The patient's blood pressure has been stable and relatively well controlled. The patient denies amaurosis fugax or recent TIA symptoms. There are no recent neurological changes noted. The patient denies history of DVT, PE or superficial thrombophlebitis. The patient denies recent episodes of angina or shortness of breath.  Current Meds  Medication Sig  . acetaminophen (TYLENOL) 325 MG tablet Take by mouth 2 (two) times daily as needed.  . dicyclomine (BENTYL) 10 MG capsule Take 10 mg by mouth 3 (three) times daily as needed.  . DULoxetine (CYMBALTA) 60 MG capsule Take 60 mg by mouth every evening.   Marland Kitchen estradiol (ESTRACE) 0.5 MG tablet Take 2 tablets by mouth daily.  Marland Kitchen gabapentin (NEURONTIN) 300 MG capsule Take by mouth. Take 2 capsules 3 times a day  . HYDROcodone-acetaminophen (NORCO) 5-325 MG tablet Take 1 tablet by mouth every  6 (six) hours as needed for moderate pain or severe pain.  . metoprolol succinate (TOPROL-XL) 25 MG 24 hr tablet Take 25 mg by mouth daily.   Marland Kitchen omeprazole (PRILOSEC) 20 MG capsule Take 20 mg by mouth daily.  . temazepam (RESTORIL) 30 MG capsule Take 30 mg by mouth at bedtime.   . triamterene-hydrochlorothiazide (DYAZIDE) 37.5-25 MG per capsule Take 1 capsule by mouth at bedtime.     Past Medical History:  Diagnosis Date  . Allergy   . GERD (gastroesophageal reflux disease)   . Headache   . History of kidney stones   . Hypertension   . Myocardial infarction ([place])    1997  . Osteoporosis   . Pneumonia     Past Surgical History:  Procedure Laterality Date  . ABDOMINAL HYSTERECTOMY     patient has ovaries  . APPENDECTOMY    . ARTERY BIOPSY Right 12/30/2017   Procedure: BIOPSY TEMPORAL ARTERY;  Surgeon: Katha Cabal, MD;  Location: [facility] ORS;  Service: Vascular;  Laterality: Right;  . BACK SURGERY    . CARDIAC CATHETERIZATION    . CATARACT EXTRACTION W/ INTRAOCULAR LENS  IMPLANT, BILATERAL Bilateral 2010  . CHOLECYSTECTOMY N/A 02/28/2016   Procedure: LAPAROSCOPIC CHOLECYSTECTOMY WITH INTRAOPERATIVE CHOLANGIOGRAM;  Surgeon: Dia Crawford III, MD;  Location: [facility] ORS;  Service: General;  Laterality: N/A;  . LUMBAR FUSION  11/09/2016   L3-L5  . SPINE SURGERY     Disc  .  TONSILLECTOMY      Social History Social History   Tobacco Use  . Smoking status: Former Smoker    Packs/day: 1.00    Years: 20.00    Pack years: 20.00    Types: Cigarettes    Last attempt to quit: 1980    Years since quitting: 40.0  . Smokeless tobacco: Never Used  Substance Use Topics  . Alcohol use: Yes    Comment: 10 drinks per week  . Drug use: No    Family History Family History  Problem Relation Age of Onset  . Heart disease Mother   . Arthritis Mother   . Cancer Mother 107       lymphoma  . Heart disease Father   . Cancer Father 25       prostate  . Cancer Sister 83       pancreatic     Allergies  Allergen Reactions  . Levofloxacin Itching  . Albuterol Palpitations  . Amlodipine     Other reaction(s): Other (See Comments) Gum swelling and dry mouth  . Latex Rash    May be more adhesive allergy than latex.  Never tested     REVIEW OF SYSTEMS (Negative unless checked)  Constitutional: '[]' Weight loss  '[]' Fever  '[]' Chills Cardiac: '[]' Chest pain   '[]' Chest pressure   '[]' Palpitations   '[]' Shortness of breath when laying flat   '[]' Shortness of breath with exertion. Vascular:  '[]' Pain in legs with walking   '[]' Pain in legs at rest  '[]' History of DVT   '[]' Phlebitis   '[]' Swelling in legs   '[]' Varicose veins   '[]' Non-healing ulcers Pulmonary:   '[]' Uses home oxygen   '[]' Productive cough   '[]' Hemoptysis   '[]' Wheeze  '[]' COPD   '[]' Asthma Neurologic:  '[]' Dizziness   '[]' Seizures   '[]' History of stroke   '[]' History of TIA  '[]' Aphasia   '[]' Vissual changes   '[]' Weakness or numbness in arm   '[]' Weakness or numbness in leg Musculoskeletal:   '[]' Joint swelling   '[x]' Joint pain   '[x]' Low back pain Hematologic:  '[]' Easy bruising  '[]' Easy bleeding   '[]' Hypercoagulable state   '[]' Anemic Gastrointestinal:  '[]' Diarrhea   '[]' Vomiting  '[]' Gastroesophageal reflux/heartburn   '[]' Difficulty swallowing. Genitourinary:  '[]' Chronic kidney disease   '[]' Difficult urination  '[]' Frequent urination   '[]' Blood in urine Skin:  '[]' Rashes   '[]' Ulcers  Psychological:  '[]' History of anxiety   '[]'  History of major depression.  Physical Examination  Vitals:   06/14/18 1456  BP: (!) 163/63  Pulse: (!) 56  Resp: 18  Weight: 136 lb (61.7 kg)  Height: '5\' 4"'  (1.626 m)   Body mass index is 23.34 kg/m. Gen: WD/WN, NAD Head: [place]/AT, No temporalis wasting.  Ear/Nose/Throat: Hearing grossly intact, nares w/o erythema or drainage Eyes: PER, EOMI, sclera nonicteric.  Neck: Supple, no large masses.   Pulmonary:  Good air movement, no audible wheezing bilaterally, no use of accessory muscles.  Cardiac: RRR, no JVD Vascular: Well-healed right temporal artery  incisional scar Vessel Right Left  Radial Palpable Palpable  Brachial Palpable Palpable  Carotid Palpable Palpable  Gastrointestinal: Non-distended. No guarding/no peritoneal signs.  Musculoskeletal: M/S 5/5 throughout.  No deformity or atrophy.  Neurologic: CN 2-12 intact. Symmetrical.  Speech is fluent. Motor exam as listed above. Psychiatric: Judgment intact, Mood & affect appropriate for pt's clinical situation. Dermatologic: No rashes or ulcers noted.  No changes consistent with cellulitis. Lymph : No lichenification or skin changes of chronic lymphedema.  CBC Lab Results  Component Value Date   WBC  7.6 11/30/2017   HGB 13.4 11/30/2017   HCT 39.2 11/30/2017   MCV 96.4 11/30/2017   PLT 353 11/30/2017    BMET    Component Value Date/Time   NA 138 12/28/2017 0903   NA 130 (L) 06/23/2012 1038   K 3.7 12/28/2017 0903   K 3.7 06/23/2012 1038   CL 105 12/28/2017 0903   CL 93 (L) 06/23/2012 1038   CO2 26 12/28/2017 0903   CO2 31 06/23/2012 1038   GLUCOSE 90 12/28/2017 0903   GLUCOSE 103 (H) 06/23/2012 1038   BUN 25 (H) 12/28/2017 0903   BUN 17 06/23/2012 1038   CREATININE 0.81 12/28/2017 0903   CREATININE 0.71 12/27/2012 1459   CALCIUM 9.1 12/28/2017 0903   CALCIUM 9.2 06/23/2012 1038   GFRNONAA >60 12/28/2017 0903   GFRNONAA >60 12/27/2012 1459   GFRAA >60 12/28/2017 0903   GFRAA >60 12/27/2012 1459   CrCl cannot be calculated (Patient's most recent lab result is older than the maximum 21 days allowed.).  COAG Lab Results  Component Value Date   INR 0.97 12/28/2017    Radiology No results found.   Assessment/Plan 1. Chronic intractable headache, unspecified headache type Recommend:  The etiology of the patient's headache remains uncertain.  She has now been [facility] with [facility].  The neurology department is quite insistent that we perform a biopsy on the left side as well.  For this reason I will move forward with left temporal artery biopsy.  I have  discussed with the patient the low yield with the second temporal artery biopsy especially given the negative first biopsy.  Nevertheless she states this is something that [facility] neurology absolutely needs and wishes to move forward.  The risks and benefits were reviewed all questions were answered patient agrees to proceed.  I will schedule the temporal artery biopsy for later this week.  Continue antiplatelet therapy as prescribed Continue management of her HTN and Hyperlipidemia Healthy heart diet,  encouraged exercise at least 4 times per week  2. Hypertension, benign Continue antihypertensive medications as already ordered, these medications have been reviewed and there are no changes at this time.   3. Osteoarthritis, unspecified osteoarthritis type, unspecified site Continue NSAID medications as already ordered, these medications have been reviewed and there are no changes at this time.  Continued activity and therapy was stressed.   4. Hypercholesterolemia Continue statin as ordered and reviewed, no changes at this time  Hortencia Pilar, MD  06/14/2018 3:10 PM

## 2018-06-15 ENCOUNTER — Telehealth (INDEPENDENT_AMBULATORY_CARE_PROVIDER_SITE_OTHER): Payer: Self-pay

## 2018-06-15 NOTE — Telephone Encounter (Signed)
Spoke with the patient and gave her the pre-op day/time of 06/17/2018 @ 12:00 pm and her surgical date of 06/18/2018 with Dr. Delana Meyer

## 2018-06-16 ENCOUNTER — Encounter (INDEPENDENT_AMBULATORY_CARE_PROVIDER_SITE_OTHER): Payer: Self-pay | Admitting: Vascular Surgery

## 2018-06-16 ENCOUNTER — Other Ambulatory Visit (INDEPENDENT_AMBULATORY_CARE_PROVIDER_SITE_OTHER): Payer: Self-pay | Admitting: Nurse Practitioner

## 2018-06-17 ENCOUNTER — Other Ambulatory Visit: Payer: Self-pay

## 2018-06-17 ENCOUNTER — Encounter
Admission: RE | Admit: 2018-06-17 | Discharge: 2018-06-17 | Disposition: A | Discharge status: Home / Self Care | Payer: Medicare Other | Source: Ambulatory Visit | Attending: Vascular Surgery | Admitting: Vascular Surgery

## 2018-06-17 DIAGNOSIS — M316 Other giant cell arteritis: Secondary | ICD-10-CM

## 2018-06-17 DIAGNOSIS — I252 Old myocardial infarction: Secondary | ICD-10-CM | POA: Diagnosis not present

## 2018-06-17 DIAGNOSIS — E7800 Pure hypercholesterolemia, unspecified: Secondary | ICD-10-CM | POA: Diagnosis not present

## 2018-06-17 DIAGNOSIS — Z01818 Encounter for other preprocedural examination: Secondary | ICD-10-CM

## 2018-06-17 DIAGNOSIS — R51 Headache: Secondary | ICD-10-CM | POA: Diagnosis present

## 2018-06-17 DIAGNOSIS — M1990 Unspecified osteoarthritis, unspecified site: Secondary | ICD-10-CM | POA: Diagnosis not present

## 2018-06-17 DIAGNOSIS — I10 Essential (primary) hypertension: Secondary | ICD-10-CM | POA: Diagnosis not present

## 2018-06-17 DIAGNOSIS — Z87891 Personal history of nicotine dependence: Secondary | ICD-10-CM | POA: Diagnosis not present

## 2018-06-17 HISTORY — DX: Anxiety disorder, unspecified: F41.9

## 2018-06-17 LAB — BASIC METABOLIC PANEL
Anion gap: 9 (ref 5–15)
BUN: 23 mg/dL (ref 8–23)
CHLORIDE: 103 mmol/L (ref 98–111)
CO2: 22 mmol/L (ref 22–32)
Calcium: 8.4 mg/dL — ABNORMAL LOW (ref 8.9–10.3)
Creatinine, Ser: 0.57 mg/dL (ref 0.44–1.00)
GFR calc Af Amer: >60 mL/min (ref >60)
Glucose, Bld: 89 mg/dL (ref 70–99)
POTASSIUM: 3.8 mmol/L (ref 3.5–5.1)
Sodium: 134 mmol/L — ABNORMAL LOW (ref 135–145)

## 2018-06-17 LAB — TYPE AND SCREEN
ABO/RH(D): O POS
ANTIBODY SCREEN: NEGATIVE

## 2018-06-17 LAB — PROTIME-INR
INR: 0.97
PROTHROMBIN TIME: 12.8 s (ref 11.4–15.2)

## 2018-06-17 LAB — APTT: aPTT: 29 seconds (ref 24–36)

## 2018-06-17 MED ORDER — CEFAZOLIN SODIUM-DEXTROSE 2-4 GM/100ML-% IV SOLN
2.0000 g | INTRAVENOUS | Status: AC
  Administered 2018-06-18: 2 g via INTRAVENOUS

## 2018-06-17 NOTE — Patient Instructions (Addendum)
Your procedure is scheduled on: 06/18/2018 Report to [facility] at 9:30 am   [phone]   Remember: Instructions that are not followed completely may result in serious medical risk, up to and including death, or upon the discretion of your surgeon and anesthesiologist your surgery may need to be rescheduled.     _X__ 1. Do not eat food after midnight the night before your procedure.                 No gum chewing or hard candies. You may drink clear liquids up to 2 hours                 before you are scheduled to arrive for your surgery- DO not drink clear                 liquids within 2 hours of the start of your surgery.                 Clear Liquids include:  water, apple juice without pulp, clear carbohydrate                 drink such as Clearfast or Gatorade, Black Coffee or Tea (Do not add                 anything to coffee or tea).  __X__2.  On the morning of surgery brush your teeth with toothpaste and water, you may rinse your mouth with mouthwash if you wish.  Do not swallow any  toothpaste of mouthwash.     _X__ 3.  No Alcohol for 24 hours before or after surgery.   _X__ 4.  Do Not Smoke or use e-cigarettes For 24 Hours Prior to Your Surgery.                 Do not use any chewable tobacco products for at least 6 hours prior to                 surgery.  ____  5.  Bring all medications with you on the day of surgery if instructed.   __X__  6.  Notify your doctor if there is any change in your medical condition      (cold, fever, infections).     Do not wear jewelry, make-up, hairpins, clips or nail polish. Do not wear lotions, powders, or perfumes.  Do not shave 48 hours prior to surgery. Men may shave face and neck. Do not bring valuables to the hospital.    [facility] is not responsible for any belongings or valuables.  Contacts, dentures/partials or body piercings may not be worn into surgery. Bring a case  for your contacts, glasses or hearing aids, a denture cup will be supplied. Leave your suitcase in the car. After surgery it may be brought to your room. For patients admitted to the hospital, discharge time is determined by your treatment team.   Patients discharged the day of surgery will not be allowed to drive home.   Please read over the following fact sheets that you were given:   MRSA Information  __X__ Take these medicines the morning of surgery with A SIP OF WATER:    1. gabapentin (NEURONTIN) 300 MG capsule  2. metoprolol succinate (TOPROL-XL)   3. omeprazole (PRILOSEC  4.  5.  6.  ____ Fleet Enema (as directed)   ____ Use CHG Soap/SAGE wipes as directed  ____ Use inhalers on the day of surgery  ____ Stop metformin/Janumet/Farxiga 2 days prior to surgery    ____ Take 1/2 of usual insulin dose the night before surgery. No insulin the morning          of surgery.   ____ Stop Blood Thinners Coumadin/Plavix/Xarelto/Pleta/Pradaxa/Eliquis/Effient/Aspirin  on   Or contact your Surgeon, Cardiologist or Medical Doctor regarding  ability to stop your blood thinners  __X__ Stop Anti-inflammatories 7 days before surgery such as Advil, Ibuprofen, Motrin,  BC or Goodies Powder, Naprosyn, Naproxen, Aleve, Aspirin   Tylenol ok  __X__ Stop all herbal supplements, fish oil or vitamin E until after surgery.    ____ Bring C-Pap to the hospital.

## 2018-06-18 ENCOUNTER — Ambulatory Visit
Admission: RE | Admit: 2018-06-18 | Discharge: 2018-06-18 | Disposition: A | Discharge status: Home / Self Care | Payer: Medicare Other | Attending: Vascular Surgery | Admitting: Vascular Surgery

## 2018-06-18 ENCOUNTER — Encounter: Payer: Self-pay | Admitting: *Deleted

## 2018-06-18 ENCOUNTER — Ambulatory Visit: Payer: Medicare Other | Admitting: Anesthesiology

## 2018-06-18 ENCOUNTER — Encounter
Admission: RE | Disposition: A | Discharge status: Home / Self Care | Payer: Self-pay | Source: Home / Self Care | Attending: Vascular Surgery

## 2018-06-18 DIAGNOSIS — R51 Headache: Secondary | ICD-10-CM | POA: Insufficient documentation

## 2018-06-18 DIAGNOSIS — I10 Essential (primary) hypertension: Secondary | ICD-10-CM | POA: Insufficient documentation

## 2018-06-18 DIAGNOSIS — M1990 Unspecified osteoarthritis, unspecified site: Secondary | ICD-10-CM | POA: Insufficient documentation

## 2018-06-18 DIAGNOSIS — I252 Old myocardial infarction: Secondary | ICD-10-CM | POA: Insufficient documentation

## 2018-06-18 DIAGNOSIS — E7800 Pure hypercholesterolemia, unspecified: Secondary | ICD-10-CM | POA: Insufficient documentation

## 2018-06-18 DIAGNOSIS — Z87891 Personal history of nicotine dependence: Secondary | ICD-10-CM | POA: Insufficient documentation

## 2018-06-18 HISTORY — PX: ARTERY BIOPSY: SHX891

## 2018-06-18 LAB — ABO/RH: ABO/RH(D): O POS

## 2018-06-18 SURGERY — BIOPSY TEMPORAL ARTERY
Anesthesia: General | Laterality: Left

## 2018-06-18 MED ORDER — LACTATED RINGERS IV SOLN
INTRAVENOUS | Status: DC
  Administered 2018-06-18 (×2): via INTRAVENOUS

## 2018-06-18 MED ORDER — LIDOCAINE-EPINEPHRINE 1 %-1:100000 IJ SOLN
INTRAMUSCULAR | Status: AC
  Filled 2018-06-18: qty 1

## 2018-06-18 MED ORDER — BUPIVACAINE HCL (PF) 0.5 % IJ SOLN
INTRAMUSCULAR | Status: AC
  Filled 2018-06-18: qty 30

## 2018-06-18 MED ORDER — LIDOCAINE HCL (CARDIAC) PF 100 MG/5ML IV SOSY
PREFILLED_SYRINGE | INTRAVENOUS | Status: DC | PRN
  Administered 2018-06-18: 30 mg via INTRAVENOUS

## 2018-06-18 MED ORDER — PROPOFOL 500 MG/50ML IV EMUL
INTRAVENOUS | Status: AC
  Filled 2018-06-18: qty 50

## 2018-06-18 MED ORDER — FENTANYL CITRATE (PF) 100 MCG/2ML IJ SOLN
INTRAMUSCULAR | Status: DC | PRN
  Administered 2018-06-18: 100 ug via INTRAVENOUS

## 2018-06-18 MED ORDER — HYDROCODONE-ACETAMINOPHEN 5-325 MG PO TABS: 1.0000 | ORAL_TABLET | Freq: Four times a day (QID) | ORAL | 0 refills | Status: DC | PRN

## 2018-06-18 MED ORDER — FENTANYL CITRATE (PF) 100 MCG/2ML IJ SOLN: 25.0000 ug | INTRAMUSCULAR | Status: DC | PRN

## 2018-06-18 MED ORDER — BUPIVACAINE HCL (PF) 0.5 % IJ SOLN
INTRAMUSCULAR | Status: DC | PRN
  Administered 2018-06-18: 3 mL

## 2018-06-18 MED ORDER — PROPOFOL 500 MG/50ML IV EMUL
INTRAVENOUS | Status: DC | PRN
  Administered 2018-06-18: 80 ug/kg/min via INTRAVENOUS

## 2018-06-18 MED ORDER — LIDOCAINE-EPINEPHRINE 1 %-1:100000 IJ SOLN
INTRAMUSCULAR | Status: DC | PRN
  Administered 2018-06-18: 9 mL

## 2018-06-18 MED ORDER — FENTANYL CITRATE (PF) 100 MCG/2ML IJ SOLN
INTRAMUSCULAR | Status: AC
  Filled 2018-06-18: qty 2

## 2018-06-18 MED ORDER — PROPOFOL 10 MG/ML IV BOLUS
INTRAVENOUS | Status: DC | PRN
  Administered 2018-06-18: 50 mg via INTRAVENOUS

## 2018-06-18 MED ORDER — TRAMADOL HCL 50 MG PO TABS: 50.0000 mg | ORAL_TABLET | Freq: Four times a day (QID) | ORAL | 0 refills | Status: DC | PRN

## 2018-06-18 MED ORDER — ACETAMINOPHEN 500 MG PO TABS
1000.0000 mg | ORAL_TABLET | Freq: Once | ORAL | Status: DC | PRN
  Filled 2018-06-18: qty 2

## 2018-06-18 MED ORDER — CEFAZOLIN SODIUM-DEXTROSE 2-4 GM/100ML-% IV SOLN
INTRAVENOUS | Status: AC
  Filled 2018-06-18: qty 100

## 2018-06-18 SURGICAL SUPPLY — 40 items
BLADE CLIPPER SURG (BLADE) ×2 IMPLANT
BLADE SURG 15 STRL LF DISP TIS (BLADE) ×1 IMPLANT
BLADE SURG 15 STRL SS (BLADE) ×1
CNTNR SPEC 2.5X3XGRAD LEK (MISCELLANEOUS)
CONT SPEC 4OZ STER OR WHT (MISCELLANEOUS)
CONTAINER SPEC 2.5X3XGRAD LEK (MISCELLANEOUS) IMPLANT
COTTON BALL STRL MEDIUM (GAUZE/BANDAGES/DRESSINGS) ×2 IMPLANT
COVER WAND RF STERILE (DRAPES) ×2 IMPLANT
DERMABOND ADVANCED (GAUZE/BANDAGES/DRESSINGS) ×1
DERMABOND ADVANCED .7 DNX12 (GAUZE/BANDAGES/DRESSINGS) ×1 IMPLANT
DRAPE LAPAROTOMY 77X122 PED (DRAPES) ×2 IMPLANT
DRSG TELFA 4X3 1S NADH ST (GAUZE/BANDAGES/DRESSINGS) ×2 IMPLANT
ELECT CAUTERY BLADE 6.4 (BLADE) ×2 IMPLANT
ELECT REM PT RETURN 9FT ADLT (ELECTROSURGICAL) ×2
ELECTRODE REM PT RTRN 9FT ADLT (ELECTROSURGICAL) ×1 IMPLANT
GLOVE BIO SURGEON STRL SZ7 (GLOVE) ×2 IMPLANT
GLOVE INDICATOR 7.5 STRL GRN (GLOVE) ×2 IMPLANT
GLOVE SURG SYN 8.0 (GLOVE) ×2 IMPLANT
GLOVE SURG SYN 8.0 PF PI (GLOVE) ×1 IMPLANT
GOWN L4 XLG 20 PK N/S (GOWN DISPOSABLE) ×2 IMPLANT
GOWN STRL REUS W/ TWL LRG LVL3 (GOWN DISPOSABLE) ×2 IMPLANT
GOWN STRL REUS W/TWL LRG LVL3 (GOWN DISPOSABLE) ×2
LABEL OR SOLS (LABEL) ×2 IMPLANT
NDL HYPO 25X1 1.5 SAFETY (NEEDLE) ×2 IMPLANT
NEEDLE HYPO 25X1 1.5 SAFETY (NEEDLE) ×4 IMPLANT
NS IRRIG 500ML POUR BTL (IV SOLUTION) ×2 IMPLANT
PACK BASIN MINOR ARMC (MISCELLANEOUS) ×2 IMPLANT
SOL PREP PVP 2OZ (MISCELLANEOUS) ×2
SOLUTION PREP PVP 2OZ (MISCELLANEOUS) ×1 IMPLANT
SUCTION FRAZIER HANDLE 10FR (MISCELLANEOUS) ×1
SUCTION TUBE FRAZIER 10FR DISP (MISCELLANEOUS) ×1 IMPLANT
SUT MNCRL AB 4-0 PS2 18 (SUTURE) ×2 IMPLANT
SUT SILK 3 0 (SUTURE) ×1
SUT SILK 3-0 18XBRD TIE 12 (SUTURE) ×1 IMPLANT
SUT SILK 4 0 (SUTURE) ×1
SUT SILK 4-0 18XBRD TIE 12 (SUTURE) ×1 IMPLANT
SUT VIC AB 3-0 SH 27 (SUTURE) ×1
SUT VIC AB 3-0 SH 27X BRD (SUTURE) ×1 IMPLANT
SYR 10ML LL (SYRINGE) ×4 IMPLANT
SYR BULB IRRIG 60ML STRL (SYRINGE) ×2 IMPLANT

## 2018-06-18 NOTE — Transfer of Care (Signed)
Immediate Anesthesia Transfer of Care Note  Patient: Kendra Benton  Procedure(s) Performed: BIOPSY TEMPORAL ARTERY (Left )  Patient Location: PACU  Anesthesia Type:General  Level of Consciousness: awake  Airway & Oxygen Therapy: Patient Spontanous Breathing  Post-op Assessment: Report given to RN  Post vital signs: stable  Last Vitals:  Vitals Value Taken Time  BP    Temp    Pulse    Resp    SpO2      Last Pain:  Vitals:   06/18/18 1004  TempSrc: Temporal  PainSc: 2          Complications: No apparent anesthesia complications

## 2018-06-18 NOTE — Anesthesia Postprocedure Evaluation (Signed)
Anesthesia Post Note  Patient: Kendra Benton  Procedure(s) Performed: BIOPSY TEMPORAL ARTERY (Left )  Patient location during evaluation: PACU Anesthesia Type: General Level of consciousness: awake and alert Pain management: pain level controlled Vital Signs Assessment: post-procedure vital signs reviewed and stable Respiratory status: spontaneous breathing, nonlabored ventilation, respiratory function stable and patient connected to nasal cannula oxygen Cardiovascular status: blood pressure returned to baseline and stable Postop Assessment: no apparent nausea or vomiting Anesthetic complications: no     Last Vitals:  Vitals:   06/18/18 1325 06/18/18 1410  BP: (!) 151/58   Pulse: (!) 56 (!) 57  Resp: 16 16  Temp: 36.4 C   SpO2: 99% 98%    Last Pain:  Vitals:   06/18/18 1325  TempSrc: Temporal  PainSc: 0-No pain                 Durenda Hurt

## 2018-06-18 NOTE — Op Note (Signed)
    OPERATIVE NOTE   PROCEDURE: Left temporal artery biopsy  PRE-OPERATIVE DIAGNOSIS: Chronic headache  POST-OPERATIVE DIAGNOSIS: Same  SURGEON: Hortencia Pilar  ASSISTANT(S): Ms. Hezzie Bump  ANESTHESIA: MAC  ESTIMATED BLOOD LOSS: 10 cc  FINDING(S): Temporal artery appears normal  SPECIMEN(S): Temporal artery to pathology  INDICATIONS:   Kendra Benton is a 78 y.o. female who presents with chronic headache.  The neurologist at [facility] has requested that the left side be biopsied.  Approximately 5 months ago the right side was biopsied and was found to be negative.  Risks and benefits are reviewed all questions answered patient has agreed to proceed.  DESCRIPTION: After full informed written consent was obtained from the patient, the patient was brought back to the operating room and placed supine upon the operating table.  Prior to induction, the patient received IV antibiotics.   After obtaining adequate anesthesia, the patient was then prepped and draped in the standard fashion for a left temporal artery biopsy.  Xylocaine with epinephrine was infiltrated in the skin and soft tissues just in front of the ear extending up over the temple.  Linear incision was then created with a 15 blade scalpel.  Hemostasis was obtained with Bovie cautery.  Soft tissues were then dissected and the fascia open.  Temporal artery was identified.  It was then skeletonized ligated and divided with 4-0 silk ties.  A segment approximately 3 cm in length was removed as the biopsy specimen.  Wound was irrigated fascia was reapproximated with 3-0 Vicryl and the skin was closed with 4-0 Monocryl subcuticular Dermabond was applied.     The patient tolerated this procedure well.   COMPLICATIONS: None  CONDITION: [name] [facility]  Office: [phone]   06/18/2018, 12:19 PM

## 2018-06-18 NOTE — Discharge Instructions (Signed)

## 2018-06-18 NOTE — Anesthesia Post-op Follow-up Note (Signed)
Anesthesia QCDR form completed.        

## 2018-06-18 NOTE — Anesthesia Preprocedure Evaluation (Addendum)
Anesthesia Evaluation  Patient identified by MRN, date of birth, ID band Patient awake    Reviewed: Allergy & Precautions, H&P , NPO status , reviewed documented beta blocker date and time   Airway Mallampati: III  TM Distance: <3 FB Neck ROM: limited    Dental  (+) Caps, Teeth Intact   Pulmonary former smoker,    Pulmonary exam normal        Cardiovascular hypertension, + Past MI  Normal cardiovascular exam  Normal stress echo Jan 2019   Neuro/Psych  Headaches, PSYCHIATRIC DISORDERS Anxiety Depression    GI/Hepatic Neg liver ROS, GERD  Controlled,  Endo/Other  Hypothyroidism   Renal/GU negative Renal ROS     Musculoskeletal  (+) Arthritis , Fibromyalgia -  Abdominal   Peds  Hematology negative hematology ROS (+)   Anesthesia Other Findings Past Medical History: No date: Allergy No date: GERD (gastroesophageal reflux disease) No date: Headache No date: History of kidney stones No date: Hypertension No date: Myocardial infarction [facility])     Comment:  1997 No date: Osteoporosis No date: Pneumonia  Past Surgical History: No date: ABDOMINAL HYSTERECTOMY     Comment:  patient has ovaries No date: APPENDECTOMY No date: BACK SURGERY No date: CARDIAC CATHETERIZATION 2010: CATARACT EXTRACTION W/ INTRAOCULAR LENS  IMPLANT, BILATERAL;  Bilateral 02/28/2016: CHOLECYSTECTOMY; N/A     Comment:  Procedure: LAPAROSCOPIC CHOLECYSTECTOMY WITH               INTRAOPERATIVE CHOLANGIOGRAM;  Surgeon: Dia Crawford III,               MD;  Location: [facility] ORS;  Service: General;  Laterality:               N/A; 11/09/2016: LUMBAR FUSION     Comment:  L3-L5 No date: SPINE SURGERY     Comment:  Disc No date: TONSILLECTOMY  BMI    Body Mass Index:  22.56 kg/m      Reproductive/Obstetrics negative OB ROS                            Anesthesia Physical  Anesthesia Plan  ASA: III  Anesthesia Plan:  General   Post-op Pain Management:    Induction:   PONV Risk Score and Plan: Treatment may vary due to age or medical condition, TIVA and Propofol infusion  Airway Management Planned: Natural Airway and Nasal Cannula  Additional Equipment:   Intra-op Plan:   Post-operative Plan:   Informed Consent: I have reviewed the patients History and Physical, chart, labs and discussed the procedure including the risks, benefits and alternatives for the proposed anesthesia with the patient or authorized representative who has indicated his/her understanding and acceptance.   Dental Advisory Given  Plan Discussed with: Anesthesiologist  Anesthesia Plan Comments:        Anesthesia Quick Evaluation

## 2018-06-18 NOTE — H&P (Signed)
[facility] History & Physical Update  The patient was interviewed and re-examined.  The patient's previous History and Physical has been reviewed and is unchanged.  There is no change in the plan of care. We plan to proceed with the scheduled procedure.  Hortencia Pilar, MD  06/18/2018, 10:33 AM

## 2018-06-21 LAB — SURGICAL PATHOLOGY

## 2018-06-28 ENCOUNTER — Other Ambulatory Visit: Payer: Self-pay

## 2018-06-28 ENCOUNTER — Ambulatory Visit (INDEPENDENT_AMBULATORY_CARE_PROVIDER_SITE_OTHER): Payer: Medicare Other | Admitting: Vascular Surgery

## 2018-06-28 ENCOUNTER — Encounter (INDEPENDENT_AMBULATORY_CARE_PROVIDER_SITE_OTHER): Payer: Self-pay | Admitting: Vascular Surgery

## 2018-06-28 VITALS — BP 130/59 | HR 61 | Resp 17 | Ht 62.0 in | Wt 140.0 lb

## 2018-06-28 DIAGNOSIS — R51 Headache: Secondary | ICD-10-CM

## 2018-06-28 DIAGNOSIS — R519 Headache, unspecified: Secondary | ICD-10-CM

## 2018-06-28 MED ORDER — TRAMADOL HCL 50 MG PO TABS: 50.0000 mg | ORAL_TABLET | Freq: Four times a day (QID) | ORAL | 0 refills | Status: DC | PRN

## 2018-07-04 ENCOUNTER — Encounter (INDEPENDENT_AMBULATORY_CARE_PROVIDER_SITE_OTHER): Payer: Self-pay | Admitting: Vascular Surgery

## 2018-07-04 NOTE — Progress Notes (Signed)
Patient ID: Kendra Benton, female   DOB: 28-Jul-1940, 78 y.o.   MRN: [id number]  Chief Complaint  Patient presents with  . Follow-up    HPI Kendra Benton is a 78 y.o. female.   Headache continues  No pain at incision site no fever chills  Pathology is negative   Past Medical History:  Diagnosis Date  . Allergy   . Anxiety   . GERD (gastroesophageal reflux disease)   . Headache   . History of kidney stones   . Hypertension   . Myocardial infarction ([place])    1997  . Osteoporosis   . Pneumonia     Past Surgical History:  Procedure Laterality Date  . ABDOMINAL HYSTERECTOMY     patient has ovaries  . APPENDECTOMY    . ARTERY BIOPSY Right 12/30/2017   Procedure: BIOPSY TEMPORAL ARTERY;  Surgeon: Katha Cabal, MD;  Location: [facility] ORS;  Service: Vascular;  Laterality: Right;  . ARTERY BIOPSY Left 06/18/2018   Procedure: BIOPSY TEMPORAL ARTERY;  Surgeon: Katha Cabal, MD;  Location: [facility] ORS;  Service: Vascular;  Laterality: Left;  . BACK SURGERY    . CARDIAC CATHETERIZATION    . CATARACT EXTRACTION W/ INTRAOCULAR LENS  IMPLANT, BILATERAL Bilateral 2010  . CHOLECYSTECTOMY N/A 02/28/2016   Procedure: LAPAROSCOPIC CHOLECYSTECTOMY WITH INTRAOPERATIVE CHOLANGIOGRAM;  Surgeon: Dia Crawford III, MD;  Location: [facility] ORS;  Service: General;  Laterality: N/A;  . LUMBAR FUSION  11/09/2016   L3-L5  . SPINE SURGERY     Disc  . TONSILLECTOMY        Allergies  Allergen Reactions  . Levofloxacin Itching  . Albuterol Palpitations  . Amlodipine Swelling and Other (See Comments)    Gum swelling and dry mouth  . Latex Rash and Other (See Comments)    May be more adhesive allergy than latex.  Never tested    Current Outpatient Medications  Medication Sig Dispense Refill  . acetaminophen (TYLENOL) 500 MG tablet Take 1,000 mg by mouth 2 (two) times daily as needed for moderate pain or headache.     . dicyclomine (BENTYL) 10 MG capsule Take 10 mg by mouth 3 (three)  times daily as needed for spasms.     . DULoxetine (CYMBALTA) 60 MG capsule Take 60 mg by mouth every evening.     Marland Kitchen estradiol (ESTRACE) 0.5 MG tablet Take 1 mg by mouth daily.     Marland Kitchen gabapentin (NEURONTIN) 300 MG capsule Take 600 mg by mouth 3 (three) times daily.     . Hypromellose (ARTIFICIAL TEARS OP) Place 1-2 drops into both eyes daily as needed (for dry eyes).    Marland Kitchen lisinopril (PRINIVIL,ZESTRIL) 10 MG tablet Take 10 mg by mouth daily.    . metoprolol succinate (TOPROL-XL) 100 MG 24 hr tablet Take 50 mg by mouth daily.     Marland Kitchen omeprazole (PRILOSEC) 20 MG capsule Take 20 mg by mouth daily.    . temazepam (RESTORIL) 30 MG capsule Take 30 mg by mouth at bedtime.     . triamterene-hydrochlorothiazide (DYAZIDE) 37.5-25 MG per capsule Take 1 capsule by mouth at bedtime.     . traMADol (ULTRAM) 50 MG tablet Take 1 tablet (50 mg total) by mouth every 6 (six) hours as needed. 30 tablet 0   No current facility-administered medications for this visit.         Physical Exam BP (!) 130/59 (BP Location: Right Arm)   Pulse 61   Resp 17  Ht 5\' 2"  (1.575 m)   Wt 140 lb (63.5 kg)   BMI 25.61 kg/m  Gen:  WD/WN, NAD Skin: incision C/D/I     Assessment/Plan: 1. Chronic intractable headache, unspecified headache type Patient did well with biopsy no complications Continue to follow up with Neurology       Hortencia Pilar 07/04/2018, 9:50 AM   This note was created with Dragon medical transcription system.  Any errors from dictation are unintentional.

## 2018-08-27 ENCOUNTER — Emergency Department: Payer: Medicare Other

## 2018-08-27 ENCOUNTER — Other Ambulatory Visit: Payer: Self-pay

## 2018-08-27 ENCOUNTER — Encounter: Payer: Self-pay | Admitting: *Deleted

## 2018-08-27 ENCOUNTER — Emergency Department
Admission: EM | Admit: 2018-08-27 | Discharge: 2018-08-28 | Disposition: A | Discharge status: Home / Self Care | Payer: Medicare Other | Attending: Emergency Medicine | Admitting: Emergency Medicine

## 2018-08-27 DIAGNOSIS — Z87891 Personal history of nicotine dependence: Secondary | ICD-10-CM | POA: Diagnosis not present

## 2018-08-27 DIAGNOSIS — K5792 Diverticulitis of intestine, part unspecified, without perforation or abscess without bleeding: Secondary | ICD-10-CM | POA: Diagnosis not present

## 2018-08-27 DIAGNOSIS — I10 Essential (primary) hypertension: Secondary | ICD-10-CM | POA: Insufficient documentation

## 2018-08-27 DIAGNOSIS — R1032 Left lower quadrant pain: Secondary | ICD-10-CM | POA: Diagnosis present

## 2018-08-27 DIAGNOSIS — Z79899 Other long term (current) drug therapy: Secondary | ICD-10-CM | POA: Insufficient documentation

## 2018-08-27 LAB — CBC
HCT: 34.4 % — ABNORMAL LOW (ref 36.0–46.0)
HEMOGLOBIN: 11.2 g/dL — AB (ref 12.0–15.0)
MCH: 32.8 pg (ref 26.0–34.0)
MCHC: 32.6 g/dL (ref 30.0–36.0)
MCV: 100.9 fL — ABNORMAL HIGH (ref 80.0–100.0)
Platelets: 378 10*3/uL (ref 150–400)
RBC: 3.41 MIL/uL — ABNORMAL LOW (ref 3.87–5.11)
RDW: 11.6 % (ref 11.5–15.5)
WBC: 8 10*3/uL (ref 4.0–10.5)
nRBC: 0 % (ref 0.0–0.2)

## 2018-08-27 LAB — COMPREHENSIVE METABOLIC PANEL
ALT: 17 U/L (ref 0–44)
AST: 17 U/L (ref 15–41)
Albumin: 3.8 g/dL (ref 3.5–5.0)
Alkaline Phosphatase: 63 U/L (ref 38–126)
Anion gap: 10 (ref 5–15)
BUN: 19 mg/dL (ref 8–23)
CO2: 22 mmol/L (ref 22–32)
Calcium: 8.7 mg/dL — ABNORMAL LOW (ref 8.9–10.3)
Chloride: 91 mmol/L — ABNORMAL LOW (ref 98–111)
Creatinine, Ser: 0.72 mg/dL (ref 0.44–1.00)
GFR calc non Af Amer: >60 mL/min (ref >60)
Glucose, Bld: 107 mg/dL — ABNORMAL HIGH (ref 70–99)
Potassium: 3.9 mmol/L (ref 3.5–5.1)
Sodium: 123 mmol/L — ABNORMAL LOW (ref 135–145)
Total Bilirubin: 0.5 mg/dL (ref 0.3–1.2)
Total Protein: 6.6 g/dL (ref 6.5–8.1)

## 2018-08-27 LAB — URINALYSIS, COMPLETE (UACMP) WITH MICROSCOPIC
Bilirubin Urine: NEGATIVE
Glucose, UA: NEGATIVE mg/dL
Hgb urine dipstick: NEGATIVE
KETONES UR: NEGATIVE mg/dL
LEUKOCYTE UA: NEGATIVE
Nitrite: NEGATIVE
Protein, ur: NEGATIVE mg/dL
Specific Gravity, Urine: 1.012 (ref 1.005–1.030)
pH: 5 (ref 5.0–8.0)

## 2018-08-27 LAB — LIPASE, BLOOD: LIPASE: 32 U/L (ref 11–51)

## 2018-08-27 MED ORDER — IOHEXOL 300 MG/ML  SOLN
75.0000 mL | Freq: Once | INTRAMUSCULAR | Status: AC | PRN
  Administered 2018-08-27: 75 mL via INTRAVENOUS

## 2018-08-27 MED ORDER — SODIUM CHLORIDE 0.9% FLUSH: 3.0000 mL | Freq: Once | INTRAVENOUS | Status: DC

## 2018-08-27 MED ORDER — MORPHINE SULFATE (PF) 2 MG/ML IV SOLN
2.0000 mg | Freq: Once | INTRAVENOUS | Status: AC
  Administered 2018-08-27: 2 mg via INTRAVENOUS
  Filled 2018-08-27: qty 1

## 2018-08-27 NOTE — ED Provider Notes (Signed)
[facility] Emergency Department Provider Note   First MD Initiated Contact with Patient 08/27/18 2334     (approximate)  I have reviewed the triage vital signs and the nursing notes.   HISTORY  Chief Complaint Abdominal Pain    HPI Kendra Benton is a 78 y.o. female with below list of previous medical conditions presents to the emergency department with left lower quadrant abdominal pain which patient states began approximately on Friday with acute worsening today.  Patient denies any fever no nausea or vomiting.  Patient denies any diarrhea or constipation.  Positive familial history (mother) of diverticulitis.        Past Medical History:  Diagnosis Date  . Allergy   . Anxiety   . GERD (gastroesophageal reflux disease)   . Headache   . History of kidney stones   . Hypertension   . Myocardial infarction ([place])    1997  . Osteoporosis   . Pneumonia     Patient Active Problem List   Diagnosis Date Noted  . Head ache 12/03/2017  . Cholecystitis with cholelithiasis 02/27/2016  . Increased frequency of urination 01/27/2015  . History of night sweats 05/01/2014  . Dupuytren's contracture 07/18/2013  . Inverted nipple 04/18/2013  . Depression 04/23/2010  . Fibromyalgia 04/23/2010  . Hypercholesterolemia 04/23/2010  . Hypertension, benign 04/23/2010  . Hypothyroidism 04/23/2010  . Irritable colon 04/23/2010  . Osteoarthritis 04/23/2010    Past Surgical History:  Procedure Laterality Date  . ABDOMINAL HYSTERECTOMY     patient has ovaries  . APPENDECTOMY    . ARTERY BIOPSY Right 12/30/2017   Procedure: BIOPSY TEMPORAL ARTERY;  Surgeon: Katha Cabal, MD;  Location: [facility] ORS;  Service: Vascular;  Laterality: Right;  . ARTERY BIOPSY Left 06/18/2018   Procedure: BIOPSY TEMPORAL ARTERY;  Surgeon: Katha Cabal, MD;  Location: [facility] ORS;  Service: Vascular;  Laterality: Left;  . BACK SURGERY    . CARDIAC CATHETERIZATION    .  CATARACT EXTRACTION W/ INTRAOCULAR LENS  IMPLANT, BILATERAL Bilateral 2010  . CHOLECYSTECTOMY N/A 02/28/2016   Procedure: LAPAROSCOPIC CHOLECYSTECTOMY WITH INTRAOPERATIVE CHOLANGIOGRAM;  Surgeon: Dia Crawford III, MD;  Location: [facility] ORS;  Service: General;  Laterality: N/A;  . LUMBAR FUSION  11/09/2016   L3-L5  . SPINE SURGERY     Disc  . TONSILLECTOMY      Prior to Admission medications   Medication Sig Start Date End Date Taking? Authorizing Provider  acetaminophen (TYLENOL) 500 MG tablet Take 1,000 mg by mouth 2 (two) times daily as needed for moderate pain or headache.     [provider]  dicyclomine (BENTYL) 10 MG capsule Take 10 mg by mouth 3 (three) times daily as needed for spasms.  08/17/14   [provider]  DULoxetine (CYMBALTA) 60 MG capsule Take 60 mg by mouth every evening.     [provider]  estradiol (ESTRACE) 0.5 MG tablet Take 1 mg by mouth daily.  04/15/17   [provider]  gabapentin (NEURONTIN) 300 MG capsule Take 600 mg by mouth 3 (three) times daily.  04/26/18   [provider]  Hypromellose (ARTIFICIAL TEARS OP) Place 1-2 drops into both eyes daily as needed (for dry eyes).    [provider]  lisinopril (PRINIVIL,ZESTRIL) 10 MG tablet Take 10 mg by mouth daily. 06/14/18 06/14/19  [provider]  metoprolol succinate (TOPROL-XL) 100 MG 24 hr tablet Take 50 mg by mouth daily.  11/29/17   [provider]  omeprazole (PRILOSEC) 20 MG capsule Take 20 mg by mouth daily.    [provider]  temazepam (RESTORIL) 30 MG capsule Take 30 mg by mouth at bedtime.     [provider]  traMADol (ULTRAM) 50 MG tablet Take 1 tablet (50 mg total) by mouth every 6 (six) hours as needed. 06/28/18   Schnier, Dolores Lory, MD  triamterene-hydrochlorothiazide (DYAZIDE) 37.5-25 MG per capsule Take 1 capsule by mouth at bedtime.     [provider]    Allergies Ciprofloxacin; Levofloxacin; Albuterol;  Amlodipine; and Latex  Family History  Problem Relation Age of Onset  . Heart disease Mother   . Arthritis Mother   . Cancer Mother 69       lymphoma  . Heart disease Father   . Cancer Father 63       prostate  . Cancer Sister 22       pancreatic    Social History Social History   Tobacco Use  . Smoking status: Former Smoker    Packs/day: 1.00    Years: 20.00    Pack years: 20.00    Types: Cigarettes    Last attempt to quit: 1980    Years since quitting: 40.2  . Smokeless tobacco: Never Used  Substance Use Topics  . Alcohol use: Yes    Alcohol/week: 10.0 standard drinks    Types: 10 Shots of liquor per week    Comment: 10 drinks per week  . Drug use: No    Review of Systems Constitutional: No fever/chills Eyes: No visual changes. ENT: No sore throat. Cardiovascular: Denies chest pain. Respiratory: Denies shortness of breath. Gastrointestinal: Positive for abdominal pain.  No nausea, no vomiting.  No diarrhea.  No constipation. Genitourinary: Negative for dysuria. Musculoskeletal: Negative for neck pain.  Negative for back pain. Integumentary: Negative for rash. Neurological: Negative for headaches, focal weakness or numbness.   ____________________________________________   PHYSICAL EXAM:  VITAL SIGNS: ED Triage Vitals [08/27/18 1917]  Enc Vitals Group     BP (!) 122/56     Pulse Rate (!) 57     Resp 16     Temp 97.7 F (36.5 C)     Temp Source Oral     SpO2 100 %     Weight 59 kg (130 lb)     Height 1.626 m (5\' 4" )     Head Circumference      Peak Flow      Pain Score 3     Pain Loc      Pain Edu?      Excl. in [place]?     Constitutional: Alert and oriented.  Apparent discomfort Eyes: Conjunctivae are normal. Head: Atraumatic. Mouth/Throat: Mucous membranes are moist. Oropharynx non-erythematous. Neck: No stridor.   Cardiovascular: Normal rate, regular rhythm. Good peripheral circulation. Grossly normal heart sounds. Respiratory: Normal  respiratory effort.  No retractions. Lungs CTAB. Gastrointestinal: Left lower quadrant tenderness to palpation.. No distention.   Musculoskeletal: No lower extremity tenderness nor edema. No gross deformities of extremities. Neurologic:  Normal speech and language. No gross focal neurologic deficits are appreciated.  Skin:  Skin is warm, dry and intact. No rash noted. Psychiatric: Mood and affect are normal. Speech and behavior are normal.  ____________________________________________   LABS (all labs ordered are listed, but only abnormal results are displayed)  Labs Reviewed  COMPREHENSIVE METABOLIC PANEL - Abnormal; Notable for the following components:      Result Value   Sodium 123 (*)  Chloride 91 (*)    Glucose, Bld 107 (*)    Calcium 8.7 (*)    All other components within normal limits  CBC - Abnormal; Notable for the following components:   RBC 3.41 (*)    Hemoglobin 11.2 (*)    HCT 34.4 (*)    MCV 100.9 (*)    All other components within normal limits  URINALYSIS, COMPLETE (UACMP) WITH MICROSCOPIC - Abnormal; Notable for the following components:   Color, Urine YELLOW (*)    APPearance CLEAR (*)    Bacteria, UA RARE (*)    All other components within normal limits  LIPASE, BLOOD     RADIOLOGY I, Carrizo [place] N Thersa Mohiuddin, personally viewed and evaluated these images (plain radiographs) as part of my medical decision making, as well as reviewing the written report by the radiologist.  ED MD interpretation: Wall thickening of the mid to distal sigmoid colon with pericolic inflammation consistent with diverticulitis.  No abscess no perforation per radiologist.  Official radiology report(s): Ct Abdomen Pelvis W Contrast  Result Date: 08/28/2018 CLINICAL DATA:  Lower abdominal pain for several days worsening today, history hypertension, GERD, MI, former smoker EXAM: CT ABDOMEN AND PELVIS WITH CONTRAST TECHNIQUE: Multidetector CT imaging of the abdomen and pelvis was performed  using the standard protocol following bolus administration of intravenous contrast. Sagittal and coronal MPR images reconstructed from axial data set. CONTRAST:  65mL OMNIPAQUE IOHEXOL 300 MG/ML  SOLN IV. COMPARISON:  02/27/2016 FINDINGS: Lower chest: Minimal dependent atelectasis at lung bases. Calcified granuloma LEFT lower lobe. Hepatobiliary: Post cholecystectomy.  Liver normal appearance Pancreas: Normal appearance Spleen: Calcified granulomata.  No mass lesion. Adrenals/Urinary Tract: Adrenal glands normal appearance. Large cyst mid upper pole LEFT kidney 5.5 x 5.3 x 5.2 cm. Minimal calyceal dilatation inferior pole LEFT kidney. No additional renal masses, hydronephrosis, hydroureter, or urinary tract calcification. Bladder unremarkable. Stomach/Bowel: Appendix surgically absent by history. Distal colonic diverticulosis with wall thickening and pericolic inflammatory changes at mid to distal sigmoid colon consistent with acute diverticulitis. No abscess, extraluminal gas or free air. Small hiatal hernia. Mild scattered stranding in small bowel mesentery. Stomach and remaining bowel loops unremarkable. Vascular/Lymphatic: Atherosclerotic calcifications aorta and iliac arteries. No adenopathy. Reproductive: Uterus surgically absent.  Normal appearing ovaries. Other: Small amount of free fluid in pelvis. No free air or abscess collection. No hernia. Musculoskeletal: Prior L3-L5 posterior fusion. Chronic compression fracture superior endplate L2 unchanged. Bones demineralized. IMPRESSION: Wall thickening of the mid to distal sigmoid colon with pericolic inflammatory changes consistent with acute diverticulitis. Small amount of free fluid but no abscess. Follow-up colonoscopy recommended following resolution of the patient's current acute process to exclude underlying sigmoid neoplasm. Large LEFT renal cyst 5.5 cm greatest size. Small hiatal hernia. Electronically Signed   By: Lavonia Dana M.D.   On: 08/28/2018  00:20      Procedures   ____________________________________________   INITIAL IMPRESSION / MDM / [place] / ED COURSE  As part of my medical decision making, I reviewed the following data within the [place] NUMBER  77 year old female presented with above-stated history and physical exam consistent with possible acute diverticulitis which was confirmed on CT abdomen pelvis.  Patient given IV morphine 2 mg and Augmentin M 75 mg p.o. in the emergency department will be prescribed same for home.  Spoke with the patient at length regarding warning signs that warrant immediate return to the emergency department.  ______________________________  FINAL CLINICAL IMPRESSION(S) / ED DIAGNOSES  Final  diagnoses:  Acute diverticulitis     MEDICATIONS GIVEN DURING THIS VISIT:  Medications  sodium chloride flush (NS) 0.9 % injection 3 mL (has no administration in time range)  morphine 2 MG/ML injection 2 mg (2 mg Intravenous Given 08/27/18 2344)  iohexol (OMNIPAQUE) 300 MG/ML solution 75 mL (75 mLs Intravenous Contrast Given 08/27/18 2357)  amoxicillin-clavulanate (AUGMENTIN) 875-125 MG per tablet 1 tablet (1 tablet Oral Given 08/28/18 0036)     ED Discharge Orders    None       Note:  This document was prepared using Dragon voice recognition software and may include unintentional dictation errors.   Gregor Hams, MD 08/28/18 [phone]

## 2018-08-27 NOTE — ED Triage Notes (Signed)
Pt reporting lower abdominal pain for several days, worsening today. Went to UC prior and sent here for further eval. No n/v/d or fevers.

## 2018-08-28 DIAGNOSIS — K5792 Diverticulitis of intestine, part unspecified, without perforation or abscess without bleeding: Secondary | ICD-10-CM | POA: Diagnosis not present

## 2018-08-28 LAB — GLUCOSE, CAPILLARY: Glucose-Capillary: 89 mg/dL (ref 70–99)

## 2018-08-28 MED ORDER — ONDANSETRON 4 MG PO TBDP: 4.0000 mg | ORAL_TABLET | Freq: Three times a day (TID) | ORAL | 0 refills | Status: DC | PRN

## 2018-08-28 MED ORDER — OXYCODONE-ACETAMINOPHEN 5-325 MG PO TABS: 1.0000 | ORAL_TABLET | ORAL | 0 refills | Status: AC | PRN

## 2018-08-28 MED ORDER — AMOXICILLIN-POT CLAVULANATE 875-125 MG PO TABS
1.0000 | ORAL_TABLET | Freq: Once | ORAL | Status: AC
  Administered 2018-08-28: 1 via ORAL
  Filled 2018-08-28: qty 1

## 2018-08-28 MED ORDER — ONDANSETRON HCL 4 MG/2ML IJ SOLN
4.0000 mg | Freq: Once | INTRAMUSCULAR | Status: AC
  Administered 2018-08-28: 4 mg via INTRAVENOUS
  Filled 2018-08-28: qty 2

## 2018-08-28 MED ORDER — OXYCODONE-ACETAMINOPHEN 5-325 MG PO TABS: 1.0000 | ORAL_TABLET | ORAL | 0 refills | Status: DC | PRN

## 2018-08-28 MED ORDER — AMOXICILLIN-POT CLAVULANATE 875-125 MG PO TABS: 1.0000 | ORAL_TABLET | Freq: Two times a day (BID) | ORAL | 0 refills | Status: AC

## 2018-08-28 NOTE — ED Notes (Signed)
Report to rachel, rn.  

## 2018-08-28 NOTE — ED Notes (Signed)
Assisted up to commode to void.

## 2018-12-29 ENCOUNTER — Other Ambulatory Visit
Admission: RE | Admit: 2018-12-29 | Discharge: 2018-12-29 | Disposition: A | Discharge status: Home / Self Care | Payer: Medicare Other | Source: Ambulatory Visit | Attending: Internal Medicine | Admitting: Internal Medicine

## 2018-12-29 ENCOUNTER — Other Ambulatory Visit: Payer: Self-pay

## 2018-12-29 DIAGNOSIS — I10 Essential (primary) hypertension: Secondary | ICD-10-CM | POA: Diagnosis not present

## 2018-12-29 DIAGNOSIS — E871 Hypo-osmolality and hyponatremia: Secondary | ICD-10-CM | POA: Diagnosis present

## 2018-12-29 LAB — POTASSIUM: Potassium: 4.8 mmol/L (ref 3.5–5.1)

## 2018-12-29 LAB — SODIUM: Sodium: 126 mmol/L — ABNORMAL LOW (ref 135–145)

## 2018-12-29 LAB — CREATININE, SERUM
Creatinine, Ser: 0.59 mg/dL (ref 0.44–1.00)
GFR calc Af Amer: >60 mL/min (ref >60)
GFR calc non Af Amer: >60 mL/min (ref >60)

## 2019-01-06 DIAGNOSIS — R918 Other nonspecific abnormal finding of lung field: Secondary | ICD-10-CM | POA: Insufficient documentation

## 2019-01-13 ENCOUNTER — Other Ambulatory Visit: Payer: Self-pay | Admitting: Internal Medicine

## 2019-01-13 ENCOUNTER — Other Ambulatory Visit: Payer: Self-pay

## 2019-01-13 DIAGNOSIS — R918 Other nonspecific abnormal finding of lung field: Secondary | ICD-10-CM

## 2019-02-07 ENCOUNTER — Other Ambulatory Visit: Payer: Self-pay | Admitting: Internal Medicine

## 2019-02-07 DIAGNOSIS — R918 Other nonspecific abnormal finding of lung field: Secondary | ICD-10-CM

## 2019-02-22 ENCOUNTER — Encounter (INDEPENDENT_AMBULATORY_CARE_PROVIDER_SITE_OTHER): Payer: Self-pay

## 2019-02-25 DIAGNOSIS — Z902 Acquired absence of lung [part of]: Secondary | ICD-10-CM | POA: Insufficient documentation

## 2019-04-18 ENCOUNTER — Other Ambulatory Visit
Admission: RE | Admit: 2019-04-18 | Discharge: 2019-04-18 | Disposition: A | Discharge status: Home / Self Care | Payer: Medicare Other | Source: Ambulatory Visit | Attending: Internal Medicine | Admitting: Internal Medicine

## 2019-04-18 DIAGNOSIS — I10 Essential (primary) hypertension: Secondary | ICD-10-CM | POA: Diagnosis present

## 2019-04-18 LAB — CREATININE, SERUM
Creatinine, Ser: 0.77 mg/dL (ref 0.44–1.00)
GFR calc Af Amer: >60 mL/min (ref >60)
GFR calc non Af Amer: >60 mL/min (ref >60)

## 2019-05-13 ENCOUNTER — Other Ambulatory Visit: Payer: Self-pay

## 2019-05-13 DIAGNOSIS — Z20822 Contact with and (suspected) exposure to covid-19: Secondary | ICD-10-CM

## 2019-05-15 LAB — NOVEL CORONAVIRUS, NAA: SARS-CoV-2, NAA: NOT DETECTED

## 2019-06-25 DIAGNOSIS — C3491 Malignant neoplasm of unspecified part of right bronchus or lung: Secondary | ICD-10-CM | POA: Insufficient documentation

## 2019-12-17 ENCOUNTER — Emergency Department: Payer: Medicare Other

## 2019-12-17 ENCOUNTER — Emergency Department
Admission: EM | Admit: 2019-12-17 | Discharge: 2019-12-17 | Disposition: A | Discharge status: Home / Self Care | Payer: Medicare Other | Attending: Emergency Medicine | Admitting: Emergency Medicine

## 2019-12-17 ENCOUNTER — Other Ambulatory Visit: Payer: Self-pay

## 2019-12-17 DIAGNOSIS — I10 Essential (primary) hypertension: Secondary | ICD-10-CM | POA: Diagnosis not present

## 2019-12-17 DIAGNOSIS — W010XXA Fall on same level from slipping, tripping and stumbling without subsequent striking against object, initial encounter: Secondary | ICD-10-CM | POA: Insufficient documentation

## 2019-12-17 DIAGNOSIS — Z79899 Other long term (current) drug therapy: Secondary | ICD-10-CM | POA: Diagnosis not present

## 2019-12-17 DIAGNOSIS — Y9289 Other specified places as the place of occurrence of the external cause: Secondary | ICD-10-CM | POA: Diagnosis not present

## 2019-12-17 DIAGNOSIS — S80212A Abrasion, left knee, initial encounter: Secondary | ICD-10-CM | POA: Diagnosis not present

## 2019-12-17 DIAGNOSIS — S61512A Laceration without foreign body of left wrist, initial encounter: Secondary | ICD-10-CM | POA: Diagnosis not present

## 2019-12-17 DIAGNOSIS — Y999 Unspecified external cause status: Secondary | ICD-10-CM | POA: Insufficient documentation

## 2019-12-17 DIAGNOSIS — Z87891 Personal history of nicotine dependence: Secondary | ICD-10-CM | POA: Diagnosis not present

## 2019-12-17 DIAGNOSIS — W19XXXA Unspecified fall, initial encounter: Secondary | ICD-10-CM

## 2019-12-17 DIAGNOSIS — S0990XA Unspecified injury of head, initial encounter: Secondary | ICD-10-CM

## 2019-12-17 DIAGNOSIS — E039 Hypothyroidism, unspecified: Secondary | ICD-10-CM | POA: Insufficient documentation

## 2019-12-17 DIAGNOSIS — S8002XA Contusion of left knee, initial encounter: Secondary | ICD-10-CM

## 2019-12-17 DIAGNOSIS — S0181XA Laceration without foreign body of other part of head, initial encounter: Secondary | ICD-10-CM

## 2019-12-17 DIAGNOSIS — Y9389 Activity, other specified: Secondary | ICD-10-CM | POA: Diagnosis not present

## 2019-12-17 DIAGNOSIS — H05232 Hemorrhage of left orbit: Secondary | ICD-10-CM

## 2019-12-17 MED ORDER — LIDOCAINE-EPINEPHRINE-TETRACAINE (LET) TOPICAL GEL
3.0000 mL | Freq: Once | TOPICAL | Status: AC
  Administered 2019-12-17: 3 mL via TOPICAL
  Filled 2019-12-17: qty 3

## 2019-12-17 MED ORDER — ACETAMINOPHEN 500 MG PO TABS
1000.0000 mg | ORAL_TABLET | Freq: Once | ORAL | Status: AC
  Administered 2019-12-17: 1000 mg via ORAL
  Filled 2019-12-17: qty 2

## 2019-12-17 NOTE — ED Triage Notes (Addendum)
Patient coming [facility] from [facility] post mechanical fall. Patient c/o left knee pain. Patient has laceration above left eye. Patient has skin tear to left wrist. Patient denies LOC, blood thinners.   EMS vitals: BP 173/76, hx hypertension. HR 89, 97% on RA.

## 2019-12-17 NOTE — ED Provider Notes (Signed)
[facility] EMERGENCY DEPARTMENT Provider Note   CSN: [id number] Arrival date & time: 12/17/19  1947     History Chief Complaint  Patient presents with  . Knee Pain    Kendra Benton is a 79 y.o. female presents to the emerge department evaluation of fall.  Patient has had 2 falls over the last 2 days.  She states she is been having issues with balance, PCP believes this is due to some of the medication she is on.  She typically walks with a cane but yesterday and today was without her cane and fell.  Both times she fell onto her left knee, had a small hematoma to the left knee yesterday but this increased today.  She suffered a skin tear to her left wrist as well as a hematoma to the left forehead and laceration to the left forehead.  She is not on any blood thinners.  Her tetanus is up-to-date.  She has soreness along the left forehead along the area of the hematoma with no vision changes, nausea or vomiting.  No neck pain back pain chest pain or shortness of breath.  No numbness tingling or radicular symptoms in the upper or lower extremities.  No lower extremity discomfort except for the left anterior knee.  She is able to straight leg raise.  She also complains of some bruising to the right foot and pain with weightbearing but no pain at rest to the right foot  HPI     Past Medical History:  Diagnosis Date  . Allergy   . Anxiety   . GERD (gastroesophageal reflux disease)   . Headache   . History of kidney stones   . Hypertension   . Myocardial infarction ([place])    1997  . Osteoporosis   . Pneumonia     Patient Active Problem List   Diagnosis Date Noted  . Head ache 12/03/2017  . Cholecystitis with cholelithiasis 02/27/2016  . Increased frequency of urination 01/27/2015  . History of night sweats 05/01/2014  . Dupuytren's contracture 07/18/2013  . Inverted nipple 04/18/2013  . Depression 04/23/2010  . Fibromyalgia 04/23/2010  .  Hypercholesterolemia 04/23/2010  . Hypertension, benign 04/23/2010  . Hypothyroidism 04/23/2010  . Irritable colon 04/23/2010  . Osteoarthritis 04/23/2010    Past Surgical History:  Procedure Laterality Date  . ABDOMINAL HYSTERECTOMY     patient has ovaries  . APPENDECTOMY    . ARTERY BIOPSY Right 12/30/2017   Procedure: BIOPSY TEMPORAL ARTERY;  Surgeon: Katha Cabal, MD;  Location: [facility] ORS;  Service: Vascular;  Laterality: Right;  . ARTERY BIOPSY Left 06/18/2018   Procedure: BIOPSY TEMPORAL ARTERY;  Surgeon: Katha Cabal, MD;  Location: [facility] ORS;  Service: Vascular;  Laterality: Left;  . BACK SURGERY    . CARDIAC CATHETERIZATION    . CATARACT EXTRACTION W/ INTRAOCULAR LENS  IMPLANT, BILATERAL Bilateral 2010  . CHOLECYSTECTOMY N/A 02/28/2016   Procedure: LAPAROSCOPIC CHOLECYSTECTOMY WITH INTRAOPERATIVE CHOLANGIOGRAM;  Surgeon: Dia Crawford III, MD;  Location: [facility] ORS;  Service: General;  Laterality: N/A;  . LUMBAR FUSION  11/09/2016   L3-L5  . SPINE SURGERY     Disc  . TONSILLECTOMY       OB History   No obstetric history on file.     Family History  Problem Relation Age of Onset  . Heart disease Mother   . Arthritis Mother   . Cancer Mother 47       lymphoma  . Heart disease  Father   . Cancer Father 69       prostate  . Cancer Sister 14       pancreatic    Social History   Tobacco Use  . Smoking status: Former Smoker    Packs/day: 1.00    Years: 20.00    Pack years: 20.00    Types: Cigarettes    Quit date: 1980    Years since quitting: 41.5  . Smokeless tobacco: Never Used  Vaping Use  . Vaping Use: Never used  Substance Use Topics  . Alcohol use: Yes    Alcohol/week: 10.0 standard drinks    Types: 10 Shots of liquor per week    Comment: 10 drinks per week  . Drug use: No    Home Medications Prior to Admission medications   Medication Sig Start Date End Date Taking? Authorizing Provider  acetaminophen (TYLENOL) 500 MG tablet Take 1,000 mg  by mouth 2 (two) times daily as needed for moderate pain or headache.     [provider]  dicyclomine (BENTYL) 10 MG capsule Take 10 mg by mouth 3 (three) times daily as needed for spasms.  08/17/14   [provider]  DULoxetine (CYMBALTA) 60 MG capsule Take 60 mg by mouth every evening.     [provider]  estradiol (ESTRACE) 0.5 MG tablet Take 1 mg by mouth daily.  04/15/17   [provider]  gabapentin (NEURONTIN) 300 MG capsule Take 600 mg by mouth 3 (three) times daily.  04/26/18   [provider]  Hypromellose (ARTIFICIAL TEARS OP) Place 1-2 drops into both eyes daily as needed (for dry eyes).    [provider]  lisinopril (PRINIVIL,ZESTRIL) 10 MG tablet Take 10 mg by mouth daily. 06/14/18 06/14/19  [provider]  metoprolol succinate (TOPROL-XL) 100 MG 24 hr tablet Take 50 mg by mouth daily.  11/29/17   [provider]  omeprazole (PRILOSEC) 20 MG capsule Take 20 mg by mouth daily.    [provider]  ondansetron (ZOFRAN ODT) 4 MG disintegrating tablet Take 1 tablet (4 mg total) by mouth every 8 (eight) hours as needed. 08/28/18   Gregor Hams, MD  temazepam (RESTORIL) 30 MG capsule Take 30 mg by mouth at bedtime.     [provider]  traMADol (ULTRAM) 50 MG tablet Take 1 tablet (50 mg total) by mouth every 6 (six) hours as needed. 06/28/18   Schnier, Dolores Lory, MD  triamterene-hydrochlorothiazide (DYAZIDE) 37.5-25 MG per capsule Take 1 capsule by mouth at bedtime.     [provider]    Allergies    Ciprofloxacin, Levofloxacin, Albuterol, Amlodipine, and Latex  Review of Systems   Review of Systems  Constitutional: Negative for fever.  Eyes: Negative for photophobia and visual disturbance.  Respiratory: Negative for shortness of breath.   Cardiovascular: Negative for chest pain.  Gastrointestinal: Negative for nausea and vomiting.  Musculoskeletal: Positive for arthralgias and joint  swelling. Negative for back pain, gait problem, myalgias, neck pain and neck stiffness.  Skin: Positive for wound.  Neurological: Negative for dizziness, light-headedness and numbness.    Physical Exam Updated Vital Signs BP (!) 167/61   Pulse 60   Temp 98.1 F (36.7 C)   Resp 18   Ht 5\' 4"  (1.626 m)   Wt 59 kg   SpO2 96%   BMI 22.33 kg/m   Physical Exam Constitutional:      Appearance: She is well-developed.  HENT:  Head: Normocephalic.     Comments: Left periorbital hematoma along the left eyebrow small 1 cm laceration just to along the left mid forehead.  No visible palpable foreign body.  Bleeding well controlled.  Tender to palpation along the hematoma but no other maxillofacial tenderness.    Right Ear: External ear normal.     Left Ear: External ear normal.  Eyes:     Extraocular Movements: Extraocular movements intact.     Conjunctiva/sclera: Conjunctivae normal.     Pupils: Pupils are equal, round, and reactive to light.  Cardiovascular:     Rate and Rhythm: Normal rate.  Pulmonary:     Effort: Pulmonary effort is normal. No respiratory distress.  Musculoskeletal:        General: Normal range of motion.     Cervical back: Normal range of motion.     Comments: Left knee with hematoma to the prepatellar region, she is able to straight leg raise.  No joint effusion.  Normal hip range of motion with no discomfort.  Superficial abrasion cleansed with Betadine and saline and then antibiotic ointment Vaseline gauze and Ace wrap applied.  No swelling or edema throughout the lower legs.  No sign of DVT.  Neurovascular tact in bilateral lower extremities.  Left wrist with a small superficial skin tear along the ulnar styloid.  No tenderness or swelling throughout the wrist.  Right foot with mild swelling and ecchymosis, minimal tenderness palpation.  No skin tears or skin breakdown noted.  2+ dorsalis pedis pulse  Skin:    General: Skin is warm.     Findings: No rash.    Neurological:     General: No focal deficit present.     Mental Status: She is alert and oriented to person, place, and time. Mental status is at baseline.     Cranial Nerves: No cranial nerve deficit.     Motor: No weakness.     Gait: Gait normal.  Psychiatric:        Mood and Affect: Mood normal.        Behavior: Behavior normal.        Thought Content: Thought content normal.     ED Results / Procedures / Treatments   Labs (all labs ordered are listed, but only abnormal results are displayed) Labs Reviewed - No data to display  EKG None  Radiology CT Head Wo Contrast  Result Date: 12/17/2019 CLINICAL DATA:  Status post fall. EXAM: CT HEAD WITHOUT CONTRAST TECHNIQUE: Contiguous axial images were obtained from the base of the skull through the vertex without intravenous contrast. COMPARISON:  MRI brain, dated November 24, 2017. FINDINGS: Brain: There is mild cerebral atrophy with widening of the extra-axial spaces and ventricular dilatation. There are areas of decreased attenuation within the white matter tracts of the supratentorial brain, consistent with microvascular disease changes. Cavum septum pellucidum is noted. 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 Vascular: No hyperdense vessel or unexpected calcification. Skull: Normal. Negative for fracture or focal lesion. Sinuses/Orbits: No acute finding. Other: There is mild left periorbital soft tissue swelling. IMPRESSION: Mild left periorbital soft tissue swelling without evidence of an acute fracture or acute intracranial abnormality. Electronically Signed   By: Virgina Norfolk M.D.   On: 12/17/2019 22:02   DG Knee Left Port  Result Date: 12/17/2019 CLINICAL DATA:  Mechanical fall, left knee pain EXAM: PORTABLE LEFT KNEE - 1-2 VIEW COMPARISON:  None. FINDINGS: Extensive soft  tissue thickening and swelling anterior as well as several foci suggestive of some mild soft tissue gas.  Correlate for abrasion or laceration. Few benign soft tissue mineralization is in the anterior soft tissues. Possible distention of the prepatellar bursa as well. No acute bony abnormality. Specifically, no fracture, subluxation, or dislocation. No sizable suprapatellar joint effusion. Mild tricompartmental degenerative changes of the knee. IMPRESSION: 1. Extensive soft tissue thickening and swelling anteriorly with likely distention of the prepatellar bursa as well as several foci of soft tissue gas. Correlate for abrasion or laceration. 2. No acute osseous abnormality. 3. Mild tricompartmental degenerative change. Electronically Signed   By: Lovena Le M.D.   On: 12/17/2019 20:24   DG Foot Complete Right  Result Date: 12/17/2019 CLINICAL DATA:  [place] today, right foot pain laterally EXAM: RIGHT FOOT COMPLETE - 3+ VIEW COMPARISON:  None. FINDINGS: Frontal, oblique, and lateral views of the right foot are obtained. No fracture, subluxation, or dislocation. Joint spaces are well preserved. Soft tissues are normal. There is a small inferior calcaneal spur. IMPRESSION: 1. No acute bony abnormality. Electronically Signed   By: Randa Ngo M.D.   On: 12/17/2019 21:35    Procedures .Marland KitchenLaceration Repair  Date/Time: 12/17/2019 10:52 PM Performed by: Duanne Guess, PA-C Authorized by: Duanne Guess, PA-C   Consent:    Consent obtained:  Verbal   Consent given by:  Patient   Risks discussed:  Need for additional repair Anesthesia (see MAR for exact dosages):    Anesthesia method:  Topical application   Topical anesthetic:  LET Laceration details:    Location:  Face   Face location:  Forehead   Length (cm):  1   Depth (mm):  2 Repair type:    Repair type:  Simple Pre-procedure details:    Preparation:  Patient was prepped and draped in usual sterile fashion Treatment:    Area cleansed with:  Betadine   Irrigation solution:  Sterile saline Skin repair:    Repair method:  Tissue  adhesive Approximation:    Approximation:  Close Post-procedure details:    Dressing:  Open (no dressing)   (including critical care time)  Medications Ordered in ED Medications  acetaminophen (TYLENOL) tablet 1,000 mg (1,000 mg Oral Given 12/17/19 2120)  lidocaine-EPINEPHrine-tetracaine (LET) topical gel (3 mLs Topical Given 12/17/19 2124)    ED Course  I have reviewed the triage vital signs and the nursing notes.  Pertinent labs & imaging results that were available during my care of the patient were reviewed by me and considered in my medical decision making (see chart for details).    MDM Rules/Calculators/A&P                          79 year old female with left forehead hematoma after a fall and head injury.  Fall occurred due to issues with balance, patient at baseline.  She was ambulating without her cane.  No chest pain shortness of breath or dizziness prior to fall.  She did suffer a small skin tear to the left wrist as well as to the left knee with a hematoma to the left knee.  X-rays of the left knee, right foot were negative.  CT of the head negative.  Dermabond applied to the left forehead.  She is educated on wound care.  Ace wrap applied to the left knee.  She is ambulatory.  Vital signs stable.  She appears well in no distress.  She understands  signs symptoms return to the ER for. Final Clinical Impression(s) / ED Diagnoses Final diagnoses:  Fall  Knee abrasion, left, initial encounter  Tear of skin of left wrist, initial encounter  Laceration of forehead, initial encounter  Periorbital hematoma, left  Hematoma of left knee region  Injury of head, initial encounter    Rx / DC Orders ED Discharge Orders    None       Renata Caprice 12/17/19 2254    Carrie Mew, MD 12/17/19 2311

## 2019-12-17 NOTE — Discharge Instructions (Addendum)
You may shower and get Dermabond wet but do not submerge underwater.  Allow Dermabond to come off on its own.  Apply ice to the left eye hematoma as well as the left knee hematoma 20 minutes every hour for the next 2 to 3 days.  Continue with Ace wrap to the left knee for 2 to 3 days.  Return to the ER for any severe headaches, nausea, vomiting, vision changes, worsening symptoms or change in health.  Apply antibiotic ointment daily to left knee abrasion

## 2020-01-03 ENCOUNTER — Other Ambulatory Visit: Payer: Self-pay

## 2020-01-03 ENCOUNTER — Ambulatory Visit: Payer: Medicare Other | Attending: Internal Medicine

## 2020-01-03 DIAGNOSIS — R2689 Other abnormalities of gait and mobility: Secondary | ICD-10-CM

## 2020-01-03 DIAGNOSIS — R2681 Unsteadiness on feet: Secondary | ICD-10-CM | POA: Insufficient documentation

## 2020-01-03 NOTE — Therapy (Signed)
[place] MAIN [facility] SERVICES [address] [place], [place], [address] Phone: [phone]   Fax:  [phone]  Physical Therapy Evaluation  Patient Details  Name: ALONNIE BIEKER MRN: [id number] Date of Birth: Jan 13, 1941 Referring Provider (PT): Jacklynn Barnacle    Encounter Date: 01/03/2020   PT End of Session - 01/03/20 1729    Visit Number 1    Number of Visits 16    Date for PT Re-Evaluation 02/28/20    Authorization Type 1/10 eval 01/03/20    PT Start Time 1100    PT Stop Time 6606    PT Time Calculation (min) 58 min    Equipment Utilized During Treatment Gait belt    Activity Tolerance Patient tolerated treatment well    Behavior During Therapy WFL for tasks assessed/performed           Past Medical History:  Diagnosis Date   Allergy    Anxiety    GERD (gastroesophageal reflux disease)    Headache    History of kidney stones    Hypertension    Myocardial infarction [facility])    1997   Osteoporosis    Pneumonia     Past Surgical History:  Procedure Laterality Date   ABDOMINAL HYSTERECTOMY     patient has ovaries   APPENDECTOMY     ARTERY BIOPSY Right 12/30/2017   Procedure: BIOPSY TEMPORAL ARTERY;  Surgeon: Katha Cabal, MD;  Location: [facility] ORS;  Service: Vascular;  Laterality: Right;   ARTERY BIOPSY Left 06/18/2018   Procedure: BIOPSY TEMPORAL ARTERY;  Surgeon: Katha Cabal, MD;  Location: [facility] ORS;  Service: Vascular;  Laterality: Left;   BACK SURGERY     CARDIAC CATHETERIZATION     CATARACT EXTRACTION W/ INTRAOCULAR LENS  IMPLANT, BILATERAL Bilateral 2010   CHOLECYSTECTOMY N/A 02/28/2016   Procedure: LAPAROSCOPIC CHOLECYSTECTOMY WITH INTRAOPERATIVE CHOLANGIOGRAM;  Surgeon: Dia Crawford III, MD;  Location: [facility] ORS;  Service: General;  Laterality: N/A;   LUMBAR FUSION  11/09/2016   L3-L5   SPINE SURGERY     Disc   TONSILLECTOMY      There were no vitals filed for this visit.     Subjective Assessment - 01/03/20 1113    Subjective Patient is a pleasant 79 year old female who presents for imbalance/falls.    Pertinent History Patient presents with SPC to physical therapy evaluation for balance/falls. Reports she has gotten more wobbly, two falls repeated last week with bruising of L face and knee. Went to the emergency room and was cleared with no fractures. Started to use a cane about 6 months ago because of uneven surfaces. Husband died 81 months ago. Patient has PMH of arthritis, bronchitis, cancer, fibromyalgia,  bilateral L>R Dupuytrens disease,  HTN, IBS, MI, pneumonia, schwannoma acousta, seborrhea, spinal cord stimulator implant. Saw pelvic floor PT in 2019 at this facility.    Limitations Lifting;Standing;Walking;House hold activities    How long can you sit comfortably? not limited except by back pain.    How long can you stand comfortably? needs to touch something with hands.    How long can you walk comfortably? has to furniture surf in the house.    Patient Stated Goals to not have to use cane.    Currently in Pain? Yes    Pain Score 3     Pain Location --   left side of body from fall.   Pain Orientation Left    Pain Descriptors / Indicators Aching  Pain Type Acute pain    Pain Onset 1 to 4 weeks ago    Pain Frequency Constant    Aggravating Factors  movement    Pain Relieving Factors rest    Effect of Pain on Daily Activities limits prolonged activity              [facility] PT Assessment - 01/03/20 0001      Assessment   Medical Diagnosis balance/falls    Referring Provider (PT) Klipstein, Christopher     Onset Date/Surgical Date --   onset ~ 6 months ago   Hand Dominance Right    Prior Therapy yes       Precautions   Precautions Fall;Back      Restrictions   Weight Bearing Restrictions No      Balance Screen   Has the patient fallen in the past 6 months Yes    Has the patient had a decrease in activity level because of a fear of falling?   Yes    Is the patient reluctant to leave their home because of a fear of falling?  Yes      [place] Private residence    Living Arrangements Alone    Type of Home Other(Comment)   duplex   Home Access Stairs to enter    Entrance Stairs-Number of Steps [place] One level    [place] seat;Crutches      Prior Function   Level of Independence Independent    Leisure go to lunch with friends, read, garden/flowers, yoga      Standardized Balance Assessment   Standardized Balance Assessment Chief Technology Officer Test   Sit to Stand Able to stand without using hands and stabilize independently    Standing Unsupported Able to stand 2 minutes with supervision    Sitting with Back Unsupported but Feet Supported on Floor or Stool Able to sit safely and securely 2 minutes    Stand to Sit Sits safely with minimal use of hands    Transfers Able to transfer safely, minor use of hands    Standing Unsupported with Eyes Closed Able to stand 3 seconds    Standing Unsupported with Feet Together Able to place feet together independently but unable to hold for 30 seconds    From Standing, Reach Forward with Outstretched Arm Can reach forward >5 cm safely (2")    From Standing Position, Pick up Object from Floor Able to pick up shoe, needs supervision    From Standing Position, Turn to Look Behind Over each Shoulder Looks behind one side only/other side shows less weight shift    Turn 360 Degrees Able to turn 360 degrees safely but slowly    Standing Unsupported, Alternately Place Feet on Step/Stool Able to complete 4 steps without aid or supervision    Standing Unsupported, One Foot in Front Able to take small step independently and hold 30 seconds    Standing on One Leg Tries to lift leg/unable to hold 3 seconds but remains standing independently    Total Score 38                   PAIN: Patient  reports constant back pain that limits her mobility  POSTURE: Seated :legs crossed , left over right with shift onto right hip   Standing: forward pelvic press, narrow BOS  PROM/AROM: Finger contractions.   STRENGTH:  Graded on a 0-5 scale Muscle Group Left Right  Hip Flex 3+/5 4/5  Hip Abd 3+/5 4-/5  Hip Add 3+/5 4-/5  Hip Ext 3/5 4-/5  Hip IR/ER 3/5 3+/5  Knee Flex 3+/5 4-/5  Knee Ext 4-/5 4/5  Ankle DF 3+/5 4-/5  Ankle PF 3+/5 4-/5     SENSATION:    SOMATOSENSORY:  Any N & T in extremities or weakness: reports :         Sensation           Intact      Diminished         Absent  Light touch LEs                              COORDINATION: Finger to Nose:   intact without pass pointing, but slow speed   Heel shin test: slow with L slightly impaired   SPECIAL TESTS: Visual field: slight deficit in lower left visual field   FUNCTIONAL MOBILITY: STs: weight shift onto RLE, painful in back.   BALANCE:  Dynamic Sitting Balance  Normal Able to sit unsupported and weight shift across midline maximally   Good Able to sit unsupported and weight shift across midline moderately   Good-/Fair+ Able to sit unsupported and weight shift across midline minimally   Fair Minimal weight shifting ipsilateral/front, difficulty crossing midline x  Fair- Reach to ipsilateral side and unable to weight shift   Poor + Able to sit unsupported with min A and reach to ipsilateral side, unable to weight shift   Poor Able to sit unsupported with mod A and reach ipsilateral/front-cant cross midline     Standing Dynamic Balance  Normal Stand independently unsupported, able to weight shift and cross midline maximally   Good Stand independently unsupported, able to weight shift and cross midline moderately   Good-/Fair+ Stand independently unsupported, able to weight shift across midline minimally   Fair Stand independently unsupported, weight shift, and reach ipsilaterally, loss of balance  when crossing midline x  Poor+ Able to stand with Min A and reach ipsilaterally, unable to weight shift   Poor Able to stand with Mod A and minimally reach ipsilaterally, unable to cross midline.     Static Sitting Balance  Normal Able to maintain balance against maximal resistance   Good Able to maintain balance against moderate resistance   Good-/Fair+ Accepts minimal resistance x  Fair Able to sit unsupported without balance loss and without UE support   Poor+ Able to maintain with Minimal assistance from individual or chair   Poor Unable to maintain balance-requires mod/max support from individual or chair     Static Standing Balance  Normal Able to maintain standing balance against maximal resistance   Good Able to maintain standing balance against moderate resistance   Good-/Fair+ Able to maintain standing balance against minimal resistance   Fair Able to stand unsupported without UE support and without LOB for 1-2 min x  Fair- Requires Min A and UE support to maintain standing without loss of balance   Poor+ Requires mod A and UE support to maintain standing without loss of balance   Poor Requires max A and UE support to maintain standing balance without loss      GAIT: Narrow BOS with frequent weaving/scissoring, decreased step length without AD. Poor heel strike and toe off.   OUTCOME MEASURES: TEST Outcome Interpretation  5 times sit<>stand 22.59 sec >60 yo, >15 sec indicates increased risk for falls  10 meter walk test   14.71 seconds              m/s <1.0 m/s indicates increased risk for falls; limited community ambulator  FOTO 60.4% Predicted discharge score 62%  Berg Balance Assessment 38/56 <36/56 (100% risk for falls), 37-45 (80% risk for falls); 46-51 (>50% risk for falls); 52-55 (lower risk <25% of falls)   Access Code: [email or web address] URL: [email or web address] Date: 01/03/2020 Prepared by: Janna Arch  Exercises Standing March with Counter Support  - 1 x daily - 7 x weekly - 10 reps - 3 sets Standing Hip Extension - 1 x daily - 7 x weekly - 10 reps - 3 sets Standing Knee Flexion - 1 x daily - 7 x weekly - 10 reps - 3 sets Standing Hip Abduction with Counter Support - 1 x daily - 7 x weekly - 10 reps - 3 sets Mini Squat with Counter Support - 1 x daily - 7 x weekly - 10 reps - 3 sets    Objective measurements completed on examination: See above findings.               PT Education - 01/03/20 1728    Education Details goals, POC, HEP    Person(s) Educated Patient    Methods Explanation;Demonstration;Tactile cues;Verbal cues;Handout    Comprehension Verbalized understanding;Returned demonstration;Verbal cues required;Tactile cues required            PT Short Term Goals - 01/03/20 1735      PT SHORT TERM GOAL #1   Title Patient will be independent in home exercise program to improve strength/mobility for better functional independence with ADLs.    Baseline 7/27: HEP given    Time 4    Period Weeks    Status New    Target Date 01/31/20             PT Long Term Goals - 01/03/20 1735      PT LONG TERM GOAL #1   Title Patient will increase FOTO score to equal to or greater than  62%   to demonstrate statistically significant improvement in mobility and quality of life.    Baseline 7/27: 60%    Time 8    Period Weeks    Status New    Target Date 02/28/20      PT LONG TERM GOAL #2   Title Patient (> 20 years old) will complete five times sit to stand test in < 15 seconds indicating an increased LE strength and improved balance    Baseline 7/27: 22.59 seconds    Time 8    Period Weeks    Target Date 02/28/20      PT LONG TERM GOAL #3   Title Patient will increase Berg Balance score by > 6 points (44/56)  to demonstrate decreased fall risk during functional activities.    Baseline 7/27: 38/56    Time 8    Period Weeks    Target Date 02/28/20      PT LONG TERM GOAL #4   Title Patient will increase 10  meter walk test to >1.69m/s as to improve gait speed for better community ambulation and to reduce fall risk.    Baseline 7/27: 0.24m/s w one near fall    Time 8    Period Weeks    Status New    Target Date 02/28/20  Plan - 01/03/20 1730    Clinical Impression Statement Patient is a pleasant 79 year old female who presents for instability and falls. She has diffuse bruising of left face, arm, and leg due to recent falls and utilized a walker to enter gym. She has L>R sided weakness with concurrent back pain. Patient is challenged with spatial awareness nearly hitting objects in left lower visual field twice. Stabilization is challenging especially on single limb or with dual task while ambulating. Patient will benefit from skilled physical therapy to increase LE strength, mobility, and stability for decreased fall risk and improved capacity for mobility.    Personal Factors and Comorbidities Age;Comorbidity 3+;Education;Past/Current Experience;Fitness;Social Background;Time since onset of injury/illness/exacerbation    Comorbidities arthritis, bronchitis, cancer, fibromyalgia,  bilateral L>R Dupuytrens disease,  HTN, IBS, MI, pneumonia, schwannoma acousta, seborrhea, spinal cord stimulator implant.    Examination-Activity Limitations Bathing;Bed Mobility;Bend;Caring for [employer];Locomotion Level;Lift;Hygiene/Grooming;Squat;Stairs;Stand;Toileting;Transfers    Examination-Participation Restrictions Church;Cleaning;Community Activity;Driving;Interpersonal Relationship;Meal Prep;Laundry;Shop;Volunteer;Yard Work    Merchant navy officer Evolving/Moderate complexity    Clinical Decision Making Moderate    Rehab Potential Fair    PT Frequency 2x / week    PT Duration 8 weeks    PT Treatment/Interventions ADLs/Self Care Home Management;Aquatic Therapy;Biofeedback;Canalith Repostioning;Ultrasound;Moist Heat;Traction;Electrical  Stimulation;Iontophoresis 4mg /ml Dexamethasone;DME Instruction;Gait training;Stair training;Functional mobility training;Neuromuscular re-education;Cognitive remediation;Balance training;Therapeutic exercise;Therapeutic activities;Patient/family education;Manual techniques;Dry needling;Passive range of motion;Energy conservation;Splinting;Vestibular    PT Next Visit Plan strength, balance    PT Home Exercise Plan see above    Consulted and Agree with Plan of Care Patient           Patient will benefit from skilled therapeutic intervention in order to improve the following deficits and impairments:  Abnormal gait, Cardiopulmonary status limiting activity, Decreased activity tolerance, Decreased balance, Decreased knowledge of precautions, Decreased endurance, Decreased coordination, Decreased knowledge of use of DME, Decreased mobility, Decreased safety awareness, Decreased range of motion, Decreased strength, Difficulty walking, Increased muscle spasms, Impaired perceived functional ability, Impaired flexibility, Improper body mechanics, Postural dysfunction, Pain  Visit Diagnosis: Unsteadiness on feet  Other abnormalities of gait and mobility     Problem List Patient Active Problem List   Diagnosis Date Noted   Head ache 12/03/2017   Cholecystitis with cholelithiasis 02/27/2016   Increased frequency of urination 01/27/2015   History of night sweats 05/01/2014   Dupuytren's contracture 07/18/2013   Inverted nipple 04/18/2013   Depression 04/23/2010   Fibromyalgia 04/23/2010   Hypercholesterolemia 04/23/2010   Hypertension, benign 04/23/2010   Hypothyroidism 04/23/2010   Irritable colon 04/23/2010   Osteoarthritis 04/23/2010   Janna Arch, PT, DPT   01/03/2020, 5:40 PM  [place] MAIN [facility] SERVICES [address] [place], [place], [address] Phone: [phone]   Fax:  [phone]  Name: AMENAH TUCCI MRN:  [id number] Date of Birth: Jan 11, 1941

## 2020-01-03 NOTE — Patient Instructions (Signed)
Access Code: [email or web address] URL: [email or web address] Date: 01/03/2020 Prepared by: Janna Arch  Exercises Standing March with Counter Support - 1 x daily - 7 x weekly - 10 reps - 3 sets Standing Hip Extension - 1 x daily - 7 x weekly - 10 reps - 3 sets Standing Knee Flexion - 1 x daily - 7 x weekly - 10 reps - 3 sets Standing Hip Abduction with Counter Support - 1 x daily - 7 x weekly - 10 reps - 3 sets Mini Squat with Counter Support - 1 x daily - 7 x weekly - 10 reps - 3 sets

## 2020-01-05 ENCOUNTER — Ambulatory Visit: Payer: Medicare Other

## 2020-01-11 ENCOUNTER — Other Ambulatory Visit: Payer: Self-pay

## 2020-01-11 ENCOUNTER — Ambulatory Visit: Payer: Medicare Other | Attending: Internal Medicine

## 2020-01-11 DIAGNOSIS — R2689 Other abnormalities of gait and mobility: Secondary | ICD-10-CM | POA: Insufficient documentation

## 2020-01-11 DIAGNOSIS — R2681 Unsteadiness on feet: Secondary | ICD-10-CM | POA: Diagnosis present

## 2020-01-11 NOTE — Therapy (Signed)
[place] MAIN [facility] SERVICES [address] [place], [place], [address] Phone: [phone]   Fax:  [phone]  Physical Therapy Treatment  Patient Details  Name: Kendra Benton MRN: [id number] Date of Birth: [birth year] Referring Provider (PT): Jacklynn Barnacle    Encounter Date: 01/11/2020   PT End of Session - 01/11/20 1120    Visit Number 2    Number of Visits 16    Date for PT Re-Evaluation 02/28/20    Authorization Type 2/10 eval 01/03/20    PT Start Time 0936    PT Stop Time 1015    PT Time Calculation (min) 39 min    Equipment Utilized During Treatment Gait belt    Activity Tolerance Patient tolerated treatment well    Behavior During Therapy [facility] for tasks assessed/performed           Past Medical History:  Diagnosis Date  . Allergy   . Anxiety   . GERD (gastroesophageal reflux disease)   . Headache   . History of kidney stones   . Hypertension   . Myocardial infarction ([place])    1997  . Osteoporosis   . Pneumonia     Past Surgical History:  Procedure Laterality Date  . ABDOMINAL HYSTERECTOMY     patient has ovaries  . APPENDECTOMY    . ARTERY BIOPSY Right 12/30/2017   Procedure: BIOPSY TEMPORAL ARTERY;  Surgeon: Katha Cabal, MD;  Location: [facility] ORS;  Service: Vascular;  Laterality: Right;  . ARTERY BIOPSY Left 06/18/2018   Procedure: BIOPSY TEMPORAL ARTERY;  Surgeon: Katha Cabal, MD;  Location: [facility] ORS;  Service: Vascular;  Laterality: Left;  . BACK SURGERY    . CARDIAC CATHETERIZATION    . CATARACT EXTRACTION W/ INTRAOCULAR LENS  IMPLANT, BILATERAL Bilateral 2010  . CHOLECYSTECTOMY N/A 02/28/2016   Procedure: LAPAROSCOPIC CHOLECYSTECTOMY WITH INTRAOPERATIVE CHOLANGIOGRAM;  Surgeon: Dia Crawford III, MD;  Location: [facility] ORS;  Service: General;  Laterality: N/A;  . LUMBAR FUSION  11/09/2016   L3-L5  . SPINE SURGERY     Disc  . TONSILLECTOMY      There were no vitals filed for this visit.    Subjective Assessment - 01/11/20 1118    Subjective Patient reports her back is hurting today from changing the sheets on the bed and moving the bed over the  weekend. Reports no falls since last session.    Pertinent History Patient presents with SPC to physical therapy evaluation for balance/falls. Reports she has gotten more wobbly, two falls repeated last week with bruising of L face and knee. Went to the emergency room and was cleared with no fractures. Started to use a cane about 6 months ago because of uneven surfaces. Husband died 34 months ago. Patient has PMH of arthritis, bronchitis, cancer, fibromyalgia,  bilateral L>R Dupuytren's disease,  HTN, IBS, MI, pneumonia, schwannoma acousta, seborrhea, spinal cord stimulator implant. Saw pelvic floor PT in 2019 at this facility.    Limitations Lifting;Standing;Walking;House hold activities    How long can you sit comfortably? not limited except by back pain.    How long can you stand comfortably? needs to touch something with hands.    How long can you walk comfortably? has to furniture surf in the house.    Patient Stated Goals to not have to use cane.    Currently in Pain? Yes    Pain Score 5     Pain Location Back    Pain Orientation Lower;Left  Pain Descriptors / Indicators Aching    Pain Type Chronic pain;Acute pain    Pain Onset 1 to 4 weeks ago    Pain Frequency Intermittent             Treatment:   Standing with CGA next to support surface:  Airex pad: static stand 30 seconds x 2 trials, noticeable trembling of ankles/LE's with fatigue and challenge to maintain stability Airex pad: horizontal head turns 30 seconds scanning room 10x ; cueing for arc of motion  Airex pad: vertical head turns 30 seconds, cueing for arc of motion, noticeable sway with upward gaze increasing demand on ankle righting reaction musculature Airex pad: one foot on 6" step one foot on airex pad, hold position for 30 seconds, switch legs, 2x each  LE; airex pad 6" step toe taps 10x each LE  Half foam roller: df/pf 20x with finger tip support  In hallway: close CGA with cues for widening BOS Rainbow ball:  -upward raise overhead 86 ft with one episode of near LOB, frequent scissoring with upward raise -lateral twist (modified) 86 ft, increased scissoring and drift requiring closer guarding/min A for stabilization  -mini toss in air 2x 86 ft, two near LOB , very challenging to dual task Head turns with PT guiding direction: up, down, left, right, more frequent LOB when turning head right 160 ft.   Ambulation in hallway results in pain in bilateral hips requiring patient to lay down for pain reduction  Supine: Dorsiflexion/plantarflexion 20x RTB bilateral LE ER        Pt educated throughout session about proper posture and technique with exercises. Improved exercise technique, movement at target joints, use of target muscles after min to mod verbal, visual, tactile cues.                  PT Education - 01/11/20 1119    Education Details exercise technique, body mechanics    Person(s) Educated Patient    Methods Explanation;Demonstration;Tactile cues;Verbal cues    Comprehension Verbalized understanding;Returned demonstration;Verbal cues required;Tactile cues required            PT Short Term Goals - 01/03/20 1735      PT SHORT TERM GOAL #1   Title Patient will be independent in home exercise program to improve strength/mobility for better functional independence with ADLs.    Baseline 7/27: HEP given    Time 4    Period Weeks    Status New    Target Date 01/31/20             PT Long Term Goals - 01/03/20 1735      PT LONG TERM GOAL #1   Title Patient will increase FOTO score to equal to or greater than  62%   to demonstrate statistically significant improvement in mobility and quality of life.    Baseline 7/27: 60%    Time 8    Period Weeks    Status New    Target Date 02/28/20      PT LONG  TERM GOAL #2   Title Patient (> 7 years old) will complete five times sit to stand test in < 15 seconds indicating an increased LE strength and improved balance    Baseline 7/27: 22.59 seconds    Time 8    Period Weeks    Target Date 02/28/20      PT LONG TERM GOAL #3   Title Patient will increase Berg Balance score by > 6 points (  44/56)  to demonstrate decreased fall risk during functional activities.    Baseline 7/27: 38/56    Time 8    Period Weeks    Target Date 02/28/20      PT LONG TERM GOAL #4   Title Patient will increase 10 meter walk test to >1.64m/s as to improve gait speed for better community ambulation and to reduce fall risk.    Baseline 7/27: 0.66m/s w one near fall    Time 8    Period Weeks    Status New    Target Date 02/28/20                 Plan - 01/11/20 1129    Clinical Impression Statement Patient presents with excellent motivation despite back pain. Unstable surfaces as well as dual tasks are challenging for the patient with frequent near LOB and poor ankle righting reactions. Patient will benefit from skilled physical therapy to increase LE strength, mobility, and stability for decreased fall risk and improved capacity for mobility.    Personal Factors and Comorbidities Age;Comorbidity 3+;Education;Past/Current Experience;Fitness;Social Background;Time since onset of injury/illness/exacerbation    Comorbidities arthritis, bronchitis, cancer, fibromyalgia,  bilateral L>R Dupuytren's disease,  HTN, IBS, MI, pneumonia, schwannoma acousta, seborrhea, spinal cord stimulator implant.    Examination-Activity Limitations Bathing;Bed Mobility;Bend;Caring for [employer];Locomotion Level;Lift;Hygiene/Grooming;Squat;Stairs;Stand;Toileting;Transfers    Examination-Participation Restrictions Church;Cleaning;Community Activity;Driving;Interpersonal Relationship;Meal Prep;Laundry;Shop;Volunteer;Yard Work    Multimedia programmer Evolving/Moderate complexity    Rehab Potential Fair    PT Frequency 2x / week    PT Duration 8 weeks    PT Treatment/Interventions ADLs/Self Care Home Management;Aquatic Therapy;Biofeedback;Canalith Repostioning;Ultrasound;Moist Heat;Traction;Electrical Stimulation;Iontophoresis 4mg /ml Dexamethasone;DME Instruction;Gait training;Stair training;Functional mobility training;Neuromuscular re-education;Cognitive remediation;Balance training;Therapeutic exercise;Therapeutic activities;Patient/family education;Manual techniques;Dry needling;Passive range of motion;Energy conservation;Splinting;Vestibular    PT Next Visit Plan strength, balance    PT Home Exercise Plan see above    Consulted and Agree with Plan of Care Patient           Patient will benefit from skilled therapeutic intervention in order to improve the following deficits and impairments:  Abnormal gait, Cardiopulmonary status limiting activity, Decreased activity tolerance, Decreased balance, Decreased knowledge of precautions, Decreased endurance, Decreased coordination, Decreased knowledge of use of DME, Decreased mobility, Decreased safety awareness, Decreased range of motion, Decreased strength, Difficulty walking, Increased muscle spasms, Impaired perceived functional ability, Impaired flexibility, Improper body mechanics, Postural dysfunction, Pain  Visit Diagnosis: Unsteadiness on feet  Other abnormalities of gait and mobility     Problem List Patient Active Problem List   Diagnosis Date Noted  . Head ache 12/03/2017  . Cholecystitis with cholelithiasis 02/27/2016  . Increased frequency of urination 01/27/2015  . History of night sweats 05/01/2014  . Dupuytren's contracture 07/18/2013  . Inverted nipple 04/18/2013  . Depression 04/23/2010  . Fibromyalgia 04/23/2010  . Hypercholesterolemia 04/23/2010  . Hypertension, benign 04/23/2010  . Hypothyroidism 04/23/2010  . Irritable colon 04/23/2010  .  Osteoarthritis 04/23/2010   Janna Arch, PT, DPT   01/11/2020, 11:30 AM  [place] MAIN [facility] SERVICES [address] [place], [place], [address] Phone: [phone]   Fax:  [phone]  Name: GAVRIELA CASHIN MRN: [id number] Date of Birth: 03/28/1941

## 2020-01-17 ENCOUNTER — Ambulatory Visit: Payer: Medicare Other

## 2020-01-17 ENCOUNTER — Other Ambulatory Visit: Payer: Self-pay

## 2020-01-20 ENCOUNTER — Ambulatory Visit: Payer: Medicare Other

## 2020-01-20 ENCOUNTER — Other Ambulatory Visit: Payer: Self-pay

## 2020-01-20 DIAGNOSIS — R2681 Unsteadiness on feet: Secondary | ICD-10-CM

## 2020-01-20 DIAGNOSIS — R2689 Other abnormalities of gait and mobility: Secondary | ICD-10-CM

## 2020-01-20 NOTE — Therapy (Signed)
[place] MAIN [facility] SERVICES [address] [place], [place], [address] Phone: [phone]   Fax:  [phone]  Physical Therapy Treatment  Patient Details  Name: Kendra Benton MRN: [id number] Date of Birth: Mar 01, 1941 Referring Provider (PT): Jacklynn Barnacle    Encounter Date: 01/20/2020   PT End of Session - 01/20/20 0934    Visit Number 3    Number of Visits 16    Date for PT Re-Evaluation 02/28/20    Authorization Type 3/10 eval 01/03/20    PT Start Time 0929    PT Stop Time 1013    PT Time Calculation (min) 44 min    Equipment Utilized During Treatment Gait belt    Activity Tolerance Patient tolerated treatment well    Behavior During Therapy [facility] for tasks assessed/performed           Past Medical History:  Diagnosis Date  . Allergy   . Anxiety   . GERD (gastroesophageal reflux disease)   . Headache   . History of kidney stones   . Hypertension   . Myocardial infarction ([place])    1997  . Osteoporosis   . Pneumonia     Past Surgical History:  Procedure Laterality Date  . ABDOMINAL HYSTERECTOMY     patient has ovaries  . APPENDECTOMY    . ARTERY BIOPSY Right 12/30/2017   Procedure: BIOPSY TEMPORAL ARTERY;  Surgeon: Katha Cabal, MD;  Location: [facility] ORS;  Service: Vascular;  Laterality: Right;  . ARTERY BIOPSY Left 06/18/2018   Procedure: BIOPSY TEMPORAL ARTERY;  Surgeon: Katha Cabal, MD;  Location: [facility] ORS;  Service: Vascular;  Laterality: Left;  . BACK SURGERY    . CARDIAC CATHETERIZATION    . CATARACT EXTRACTION W/ INTRAOCULAR LENS  IMPLANT, BILATERAL Bilateral 2010  . CHOLECYSTECTOMY N/A 02/28/2016   Procedure: LAPAROSCOPIC CHOLECYSTECTOMY WITH INTRAOPERATIVE CHOLANGIOGRAM;  Surgeon: Dia Crawford III, MD;  Location: [facility] ORS;  Service: General;  Laterality: N/A;  . LUMBAR FUSION  11/09/2016   L3-L5  . SPINE SURGERY     Disc  . TONSILLECTOMY      There were no vitals filed for this visit.    Subjective Assessment - 01/20/20 0932    Subjective Patient presents with increased back pain, reports she missed last session due to overdoing it prior in the day.    Pertinent History Patient presents with SPC to physical therapy evaluation for balance/falls. Reports she has gotten more wobbly, two falls repeated last week with bruising of L face and knee. Went to the emergency room and was cleared with no fractures. Started to use a cane about 6 months ago because of uneven surfaces. Husband died 3 months ago. Patient has PMH of arthritis, bronchitis, cancer, fibromyalgia,  bilateral L>R Dupuytren's disease,  HTN, IBS, MI, pneumonia, schwannoma acousta, seborrhea, spinal cord stimulator implant. Saw pelvic floor PT in 2019 at this facility.    Limitations Lifting;Standing;Walking;House hold activities    How long can you sit comfortably? not limited except by back pain.    How long can you stand comfortably? needs to touch something with hands.    How long can you walk comfortably? has to furniture surf in the house.    Patient Stated Goals to not have to use cane.    Currently in Pain? Yes    Pain Score 8     Pain Location Back    Pain Orientation Lower    Pain Descriptors / Indicators Aching  Pain Type Chronic pain    Pain Onset 1 to 4 weeks ago    Pain Frequency Intermittent            Nustep Lvl 2 RPM>40 with heat pad behind back for pain reduction    Treatment:    Supine: heat pad under back: -posterior pelvic rotation 10x 3 second holds -posterior plevic tilt with green adduction ball squeeze 10x 3 second squeezes -GTB abduction 20x cues for velocity of movement -3lb ankle weight: posterior pelvic tilt with TRA activation with modified march 10x each LE -3lb ankle weight SAQ 20x each LE -green swiss ball TrA activation pressing between knees and hands 10x 3 second holds -green swiss ball hamstring curl 15x  Hamstring stretch with leg on PT shoulder RLE 60 seconds   Piriformis stretch RLE 60 seconds slight pain LE rotation 60 seconds with increasing range with repetition.       Pt educated throughout session about proper posture and technique with exercises. Improved exercise technique, movement at target joints, use of target muscles after min to mod verbal, visual, tactile cues.                            PT Education - 01/20/20 0933    Education Details exercise technique, body mechanics    Person(s) Educated Patient    Methods Explanation;Demonstration;Verbal cues;Tactile cues    Comprehension Verbalized understanding;Returned demonstration;Verbal cues required;Tactile cues required            PT Short Term Goals - 01/03/20 1735      PT SHORT TERM GOAL #1   Title Patient will be independent in home exercise program to improve strength/mobility for better functional independence with ADLs.    Baseline 7/27: HEP given    Time 4    Period Weeks    Status New    Target Date 01/31/20             PT Long Term Goals - 01/03/20 1735      PT LONG TERM GOAL #1   Title Patient will increase FOTO score to equal to or greater than  62%   to demonstrate statistically significant improvement in mobility and quality of life.    Baseline 7/27: 60%    Time 8    Period Weeks    Status New    Target Date 02/28/20      PT LONG TERM GOAL #2   Title Patient (> 4 years old) will complete five times sit to stand test in < 15 seconds indicating an increased LE strength and improved balance    Baseline 7/27: 22.59 seconds    Time 8    Period Weeks    Target Date 02/28/20      PT LONG TERM GOAL #3   Title Patient will increase Berg Balance score by > 6 points (44/56)  to demonstrate decreased fall risk during functional activities.    Baseline 7/27: 38/56    Time 8    Period Weeks    Target Date 02/28/20      PT LONG TERM GOAL #4   Title Patient will increase 10 meter walk test to >1.77m/s as to improve gait speed for  better community ambulation and to reduce fall risk.    Baseline 7/27: 0.35m/s w one near fall    Time 8    Period Weeks    Status New    Target Date 02/28/20  Plan - 01/20/20 0958    Clinical Impression Statement Patient's session limited by low pain resulting in need for supine strengthening interventions to be performed. Stability interventions deferred to to increasing pain levels. Patient is challenged with dual task interventions requiring more cueing for task orientation. Patient will benefit from skilled physical therapy to increase LE strength, mobility, and stability for decreased fall risk and improved capacity for mobility.    Personal Factors and Comorbidities Age;Comorbidity 3+;Education;Past/Current Experience;Fitness;Social Background;Time since onset of injury/illness/exacerbation    Comorbidities arthritis, bronchitis, cancer, fibromyalgia,  bilateral L>R Dupuytren's disease,  HTN, IBS, MI, pneumonia, schwannoma acousta, seborrhea, spinal cord stimulator implant.    Examination-Activity Limitations Bathing;Bed Mobility;Bend;Caring for [employer];Locomotion Level;Lift;Hygiene/Grooming;Squat;Stairs;Stand;Toileting;Transfers    Examination-Participation Restrictions Church;Cleaning;Community Activity;Driving;Interpersonal Relationship;Meal Prep;Laundry;Shop;Volunteer;Yard Work    Merchant navy officer Evolving/Moderate complexity    Rehab Potential Fair    PT Frequency 2x / week    PT Duration 8 weeks    PT Treatment/Interventions ADLs/Self Care Home Management;Aquatic Therapy;Biofeedback;Canalith Repostioning;Ultrasound;Moist Heat;Traction;Electrical Stimulation;Iontophoresis 4mg /ml Dexamethasone;DME Instruction;Gait training;Stair training;Functional mobility training;Neuromuscular re-education;Cognitive remediation;Balance training;Therapeutic exercise;Therapeutic activities;Patient/family education;Manual  techniques;Dry needling;Passive range of motion;Energy conservation;Splinting;Vestibular    PT Next Visit Plan strength, balance    PT Home Exercise Plan see above    Consulted and Agree with Plan of Care Patient           Patient will benefit from skilled therapeutic intervention in order to improve the following deficits and impairments:  Abnormal gait, Cardiopulmonary status limiting activity, Decreased activity tolerance, Decreased balance, Decreased knowledge of precautions, Decreased endurance, Decreased coordination, Decreased knowledge of use of DME, Decreased mobility, Decreased safety awareness, Decreased range of motion, Decreased strength, Difficulty walking, Increased muscle spasms, Impaired perceived functional ability, Impaired flexibility, Improper body mechanics, Postural dysfunction, Pain  Visit Diagnosis: Unsteadiness on feet  Other abnormalities of gait and mobility     Problem List Patient Active Problem List   Diagnosis Date Noted  . Head ache 12/03/2017  . Cholecystitis with cholelithiasis 02/27/2016  . Increased frequency of urination 01/27/2015  . History of night sweats 05/01/2014  . Dupuytren's contracture 07/18/2013  . Inverted nipple 04/18/2013  . Depression 04/23/2010  . Fibromyalgia 04/23/2010  . Hypercholesterolemia 04/23/2010  . Hypertension, benign 04/23/2010  . Hypothyroidism 04/23/2010  . Irritable colon 04/23/2010  . Osteoarthritis 04/23/2010   Janna Arch, PT, DPT   01/20/2020, 10:24 AM  [place] MAIN [facility] SERVICES [address] [place], [place], [address] Phone: [phone]   Fax:  [phone]  Name: Kendra Benton MRN: [id number] Date of Birth: Jul 29, 1940

## 2020-01-23 ENCOUNTER — Ambulatory Visit: Payer: Medicare Other

## 2020-01-30 ENCOUNTER — Other Ambulatory Visit: Payer: Self-pay

## 2020-01-30 ENCOUNTER — Ambulatory Visit: Payer: Medicare Other

## 2020-01-30 DIAGNOSIS — R2681 Unsteadiness on feet: Secondary | ICD-10-CM | POA: Diagnosis not present

## 2020-01-30 DIAGNOSIS — R2689 Other abnormalities of gait and mobility: Secondary | ICD-10-CM

## 2020-01-30 NOTE — Therapy (Signed)
[place] MAIN [facility] SERVICES [address] [place], [place], [address] Phone: [phone]   Fax:  [phone]  Physical Therapy Treatment  Patient Details  Name: Kendra Benton MRN: [id number] Date of Birth: May 20, 1941 Referring Provider (PT): Jacklynn Barnacle    Encounter Date: 01/30/2020   PT End of Session - 01/30/20 0929    Visit Number 4    Number of Visits 16    Date for PT Re-Evaluation 02/28/20    Authorization Type 4/10 eval 01/03/20    PT Start Time 0929    PT Stop Time 1013    PT Time Calculation (min) 44 min    Equipment Utilized During Treatment Gait belt    Activity Tolerance Patient tolerated treatment well    Behavior During Therapy [facility] for tasks assessed/performed           Past Medical History:  Diagnosis Date  . Allergy   . Anxiety   . GERD (gastroesophageal reflux disease)   . Headache   . History of kidney stones   . Hypertension   . Myocardial infarction ([place])    1997  . Osteoporosis   . Pneumonia     Past Surgical History:  Procedure Laterality Date  . ABDOMINAL HYSTERECTOMY     patient has ovaries  . APPENDECTOMY    . ARTERY BIOPSY Right 12/30/2017   Procedure: BIOPSY TEMPORAL ARTERY;  Surgeon: Katha Cabal, MD;  Location: [facility] ORS;  Service: Vascular;  Laterality: Right;  . ARTERY BIOPSY Left 06/18/2018   Procedure: BIOPSY TEMPORAL ARTERY;  Surgeon: Katha Cabal, MD;  Location: [facility] ORS;  Service: Vascular;  Laterality: Left;  . BACK SURGERY    . CARDIAC CATHETERIZATION    . CATARACT EXTRACTION W/ INTRAOCULAR LENS  IMPLANT, BILATERAL Bilateral 2010  . CHOLECYSTECTOMY N/A 02/28/2016   Procedure: LAPAROSCOPIC CHOLECYSTECTOMY WITH INTRAOPERATIVE CHOLANGIOGRAM;  Surgeon: Dia Crawford III, MD;  Location: [facility] ORS;  Service: General;  Laterality: N/A;  . LUMBAR FUSION  11/09/2016   L3-L5  . SPINE SURGERY     Disc  . TONSILLECTOMY      There were no vitals filed for this visit.    Subjective Assessment - 01/30/20 0933    Subjective Patient reports her back pain continues to be present and limit her mobility, is noticing she is slumping to the side.    Pertinent History Patient presents with SPC to physical therapy evaluation for balance/falls. Reports she has gotten more wobbly, two falls repeated last week with bruising of L face and knee. Went to the emergency room and was cleared with no fractures. Started to use a cane about 6 months ago because of uneven surfaces. Husband died 14 months ago. Patient has PMH of arthritis, bronchitis, cancer, fibromyalgia,  bilateral L>R Dupuytren's disease,  HTN, IBS, MI, pneumonia, schwannoma acousta, seborrhea, spinal cord stimulator implant. Saw pelvic floor PT in 2019 at this facility.    Limitations Lifting;Standing;Walking;House hold activities    How long can you sit comfortably? not limited except by back pain.    How long can you stand comfortably? needs to touch something with hands.    How long can you walk comfortably? has to furniture surf in the house.    Patient Stated Goals to not have to use cane.    Currently in Pain? Yes    Pain Score 5     Pain Location Back    Pain Orientation Lower    Pain Descriptors / Indicators  Aching    Pain Type Chronic pain    Pain Onset 1 to 4 weeks ago    Pain Frequency Intermittent                Nustep Lvl 2 RPM>40 with heat pad behind back for pain reduction     Standing with CGA next to support surface:  Airex pad: static stand 30 seconds x 2 trials, noticeable trembling of ankles/LE's with fatigue and challenge to maintain stability; second set with eyes closed.  Airex pad: one foot on 6" step one foot on airex pad, hold position for 60 seconds, switch legs, 2x each LE; airex pad 6" step toe taps 10x each LE ; no UE support airex pad 6" step, lateral toe raises 10x each LE, SUE support  Half foam roller: df/pf 20x with finger tip support orange hurdle: step over and back  10x each LE, with SUE support, with two sets. Second set with decreased UE support Heel toe raises 20x with UE support   Seated:  Alternating LAQ with GTB around ankles ; 10 each LE Seated LAQ with green ball adduction squeeze 10x GTB single arm row for posture 10x each UE, cues for positioning on edge of chair     Pt educated throughout session about proper posture and technique with exercises. Improved exercise technique, movement at target joints, use of target muscles after min to mod verbal, visual, tactile cues                       PT Education - 01/30/20 0929    Education Details exercise technique, body mechanics    Person(s) Educated Patient    Methods Explanation;Demonstration;Tactile cues;Verbal cues    Comprehension Verbalized understanding;Returned demonstration;Verbal cues required;Tactile cues required            PT Short Term Goals - 01/03/20 1735      PT SHORT TERM GOAL #1   Title Patient will be independent in home exercise program to improve strength/mobility for better functional independence with ADLs.    Baseline 7/27: HEP given    Time 4    Period Weeks    Status New    Target Date 01/31/20             PT Long Term Goals - 01/03/20 1735      PT LONG TERM GOAL #1   Title Patient will increase FOTO score to equal to or greater than  62%   to demonstrate statistically significant improvement in mobility and quality of life.    Baseline 7/27: 60%    Time 8    Period Weeks    Status New    Target Date 02/28/20      PT LONG TERM GOAL #2   Title Patient (> 34 years old) will complete five times sit to stand test in < 15 seconds indicating an increased LE strength and improved balance    Baseline 7/27: 22.59 seconds    Time 8    Period Weeks    Target Date 02/28/20      PT LONG TERM GOAL #3   Title Patient will increase Berg Balance score by > 6 points (44/56)  to demonstrate decreased fall risk during functional activities.     Baseline 7/27: 38/56    Time 8    Period Weeks    Target Date 02/28/20      PT LONG TERM GOAL #4   Title Patient will  increase 10 meter walk test to >1.65m/s as to improve gait speed for better community ambulation and to reduce fall risk.    Baseline 7/27: 0.74m/s w one near fall    Time 8    Period Weeks    Status New    Target Date 02/28/20                 Plan - 01/30/20 1009    Clinical Impression Statement Patient presents with excellent motivation despite low back pain. Tolerated stability interventions with occasional LOB due to single limb task requirements and limited ankle righting reactions. Cueing for increasing hip flexion for foot clearance required intermittently. Patient will benefit from skilled physical therapy to increase LE strength, mobility, and stability for decreased fall risk and improved capacity for mobility.    Personal Factors and Comorbidities Age;Comorbidity 3+;Education;Past/Current Experience;Fitness;Social Background;Time since onset of injury/illness/exacerbation    Comorbidities arthritis, bronchitis, cancer, fibromyalgia,  bilateral L>R Dupuytren's disease,  HTN, IBS, MI, pneumonia, schwannoma acousta, seborrhea, spinal cord stimulator implant.    Examination-Activity Limitations Bathing;Bed Mobility;Bend;Caring for [employer];Locomotion Level;Lift;Hygiene/Grooming;Squat;Stairs;Stand;Toileting;Transfers    Examination-Participation Restrictions Church;Cleaning;Community Activity;Driving;Interpersonal Relationship;Meal Prep;Laundry;Shop;Volunteer;Yard Work    [employer] officer Evolving/Moderate complexity    Rehab Potential Fair    PT Frequency 2x / week    PT Duration 8 weeks    PT Treatment/Interventions ADLs/Self Care Home Management;Aquatic Therapy;Biofeedback;Canalith Repostioning;Ultrasound;Moist Heat;Traction;Electrical Stimulation;Iontophoresis 4mg /ml Dexamethasone;DME Instruction;Gait  training;Stair training;Functional mobility training;Neuromuscular re-education;Cognitive remediation;Balance training;Therapeutic exercise;Therapeutic activities;Patient/family education;Manual techniques;Dry needling;Passive range of motion;Energy conservation;Splinting;Vestibular    PT Next Visit Plan strength, balance    PT Home Exercise Plan see above    Consulted and Agree with Plan of Care Patient           Patient will benefit from skilled therapeutic intervention in order to improve the following deficits and impairments:  Abnormal gait, Cardiopulmonary status limiting activity, Decreased activity tolerance, Decreased balance, Decreased knowledge of precautions, Decreased endurance, Decreased coordination, Decreased knowledge of use of DME, Decreased mobility, Decreased safety awareness, Decreased range of motion, Decreased strength, Difficulty walking, Increased muscle spasms, Impaired perceived functional ability, Impaired flexibility, Improper body mechanics, Postural dysfunction, Pain  Visit Diagnosis: Unsteadiness on feet  Other abnormalities of gait and mobility     Problem List Patient Active Problem List   Diagnosis Date Noted  . Head ache 12/03/2017  . Cholecystitis with cholelithiasis 02/27/2016  . Increased frequency of urination 01/27/2015  . History of night sweats 05/01/2014  . Dupuytren's contracture 07/18/2013  . Inverted nipple 04/18/2013  . Depression 04/23/2010  . Fibromyalgia 04/23/2010  . Hypercholesterolemia 04/23/2010  . Hypertension, benign 04/23/2010  . Hypothyroidism 04/23/2010  . Irritable colon 04/23/2010  . Osteoarthritis 04/23/2010   Janna Arch, PT, DPT   01/30/2020, 12:55 PM  [place] MAIN [facility] SERVICES [address] [place], [place], [address] Phone: [phone]   Fax:  [phone]  Name: Kendra Benton MRN: [id number] Date of Birth: [birth year]

## 2020-02-01 ENCOUNTER — Other Ambulatory Visit: Payer: Self-pay

## 2020-02-01 ENCOUNTER — Ambulatory Visit: Payer: Medicare Other

## 2020-02-01 DIAGNOSIS — R2681 Unsteadiness on feet: Secondary | ICD-10-CM

## 2020-02-01 DIAGNOSIS — R2689 Other abnormalities of gait and mobility: Secondary | ICD-10-CM

## 2020-02-01 NOTE — Therapy (Signed)
[place] MAIN [facility] SERVICES [address] [place], [place], [address] Phone: [phone]   Fax:  [phone]  Physical Therapy Treatment  Patient Details  Name: Kendra Benton MRN: [id number] Date of Birth: 1940/07/12 Referring Provider (PT): Jacklynn Barnacle    Encounter Date: 02/01/2020   PT End of Session - 02/01/20 1214    Visit Number 5    Number of Visits 16    Date for PT Re-Evaluation 02/28/20    Authorization Type 5/10 eval 01/03/20    PT Start Time 1104    PT Stop Time 1147    PT Time Calculation (min) 43 min    Equipment Utilized During Treatment Gait belt    Activity Tolerance Patient tolerated treatment well    Behavior During Therapy WFL for tasks assessed/performed           Past Medical History:  Diagnosis Date   Allergy    Anxiety    GERD (gastroesophageal reflux disease)    Headache    History of kidney stones    Hypertension    Myocardial infarction [facility])    1997   Osteoporosis    Pneumonia     Past Surgical History:  Procedure Laterality Date   ABDOMINAL HYSTERECTOMY     patient has ovaries   APPENDECTOMY     ARTER[place] BIOPSY Right 12/30/2017   Procedure: BIOPSY TEMPORAL ARTERY;  Surgeon: Katha Cabal, MD;  Location: [facility] ORS;  Service: Vascular;  Laterality: Right;   ARTERY BIOPSY Left 06/18/2018   Procedure: BIOPSY TEMPORAL ARTERY;  Surgeon: Katha Cabal, MD;  Location: [facility] ORS;  Service: Vascular;  Laterality: Left;   BACK SURGERY     CARDIAC CATHETERIZATION     CATARACT EXTRACTION W/ INTRAOCULAR LENS  IMPLANT, BILATERAL Bilateral 2010   CHOLECYSTECTOMY N/A 02/28/2016   Procedure: LAPAROSCOPIC CHOLECYSTECTOMY WITH INTRAOPERATIVE CHOLANGIOGRAM;  Surgeon: Dia Crawford III, MD;  Location: [facility] ORS;  Service: General;  Laterality: N/A;   LUMBAR FUSION  11/09/2016   L3-L5   SPINE SURGERY     Disc   TONSILLECTOMY      There were no vitals filed for this visit.    Subjective Assessment - 02/01/20 1213    Subjective Patient reports her back is doing a little better but did have a fall since seen last when walking down a hill , is having a hard time with memory.    Pertinent History Patient presents with SPC to physical therapy evaluation for balance/falls. Reports she has gotten more wobbly, two falls repeated last week with bruising of L face and knee. Went to the emergency room and was cleared with no fractures. Started to use a cane about 6 months ago because of uneven surfaces. Husband died 44 months ago. Patient has PMH of arthritis, bronchitis, cancer, fibromyalgia,  bilateral L>R Dupuytrens disease,  HTN, IBS, MI, pneumonia, schwannoma acousta, seborrhea, spinal cord stimulator implant. Saw pelvic floor PT in 2019 at this facility.    Limitations Lifting;Standing;Walking;House hold activities    How long can you sit comfortably? not limited except by back pain.    How long can you stand comfortably? needs to touch something with hands.    How long can you walk comfortably? has to furniture surf in the house.    Patient Stated Goals to not have to use cane.    Currently in Pain? Yes    Pain Score 2     Pain Location Back    Pain  Orientation Lower    Pain Descriptors / Indicators Aching    Pain Type Chronic pain    Pain Onset 1 to 4 weeks ago    Pain Frequency Intermittent                  Standing with CGA next to support surface:  Airex pad: static tandem stance  30 seconds x 2 trials, noticeable trembling of ankles/LE's with fatigue and challenge to maintain stability; second set with eyes closed.  Airex pad:one foot on each color pad for modified tandem stance hold position for 60 seconds, switch legs, 2x each LE; airex pad step over orange hurdle to another airex pad 10x each LE airex pad: STS min A cues to keep knees abducted 10x with arms crossed, two near LOB Step over consecutive hurdles and half foam rollers without UE support 6x  length of // bars. airex balance beam lateral stepping, min-mod A for maintaining COM 8x length of // bars  Cone taps with occasional min A for retaining COM x3 cones x 8 trials each LE [place] with occasional UE support 4x length of // bars.     Pt educated throughout session about proper posture and technique with exercises. Improved exercise technique, movement at target joints, use of target muscles after min to mod verbal, visual, tactile cues      Access Code: [email or web address] URL: [email or web address] Date: 02/01/2020 Prepared by: Janna Arch  Program Notes with countertop support and chair behind you.    Exercises Tandem Stance on Foam Pad with Eyes Open - 1 x daily - 7 x weekly - 2 sets - 2 reps - 30 hold Standing March with Counter Support - 1 x daily - 7 x weekly - 2 sets - 10 reps - 5 hold Sit to Stand - 1 x daily - 7 x weekly - 2 sets - 10 reps - 5 hold                     PT Education - 02/01/20 1214    Education Details exercise technique, body mechanics    Person(s) Educated Patient    Methods Explanation;Demonstration;Tactile cues;Verbal cues    Comprehension Verbalized understanding;Returned demonstration;Verbal cues required;Tactile cues required            PT Short Term Goals - 01/03/20 1735      PT SHORT TERM GOAL #1   Title Patient will be independent in home exercise program to improve strength/mobility for better functional independence with ADLs.    Baseline 7/27: HEP given    Time 4    Period Weeks    Status New    Target Date 01/31/20             PT Long Term Goals - 01/03/20 1735      PT LONG TERM GOAL #1   Title Patient will increase FOTO score to equal to or greater than  62%   to demonstrate statistically significant improvement in mobility and quality of life.    Baseline 7/27: 60%    Time 8    Period Weeks    Status New    Target Date 02/28/20      PT LONG TERM GOAL #2   Title Patient (> 70 years  old) will complete five times sit to stand test in < 15 seconds indicating an increased LE strength and improved balance    Baseline 7/27: 22.59 seconds    Time 8  Period Weeks    Target Date 02/28/20      PT LONG TERM GOAL #3   Title Patient will increase Berg Balance score by > 6 points (44/56)  to demonstrate decreased fall risk during functional activities.    Baseline 7/27: 38/56    Time 8    Period Weeks    Target Date 02/28/20      PT LONG TERM GOAL #4   Title Patient will increase 10 meter walk test to >1.63m/s as to improve gait speed for better community ambulation and to reduce fall risk.    Baseline 7/27: 0.49m/s w one near fall    Time 8    Period Weeks    Status New    Target Date 02/28/20                 Plan - 02/01/20 1216    Clinical Impression Statement Patient presents with excellent motivation. She requires frequent multimodal cueing throughout session with additional need for task orientation and decreased memory of sequencing and previous interventions. Increased stability and mobility with education on breathing and task focus. Patient will benefit from skilled physical therapy to increase LE strength, mobility, and stability for decreased fall risk and improved capacity for mobility.    Personal Factors and Comorbidities Age;Comorbidity 3+;Education;Past/Current Experience;Fitness;Social Background;Time since onset of injury/illness/exacerbation    Comorbidities arthritis, bronchitis, cancer, fibromyalgia,  bilateral L>R Dupuytrens disease,  HTN, IBS, MI, pneumonia, schwannoma acousta, seborrhea, spinal cord stimulator implant.    Examination-Activity Limitations Bathing;Bed Mobility;Bend;Caring for [employer];Locomotion Level;Lift;Hygiene/Grooming;Squat;Stairs;Stand;Toileting;Transfers    Examination-Participation Restrictions Church;Cleaning;Community Activity;Driving;Interpersonal Relationship;Meal  Prep;Laundry;Shop;Volunteer;Yard Work    [employer] officer Evolving/Moderate complexity    Rehab Potential Fair    PT Frequency 2x / week    PT Duration 8 weeks    PT Treatment/Interventions ADLs/Self Care Home Management;Aquatic Therapy;Biofeedback;Canalith Repostioning;Ultrasound;Moist Heat;Traction;Electrical Stimulation;Iontophoresis 4mg /ml Dexamethasone;DME Instruction;Gait training;Stair training;Functional mobility training;Neuromuscular re-education;Cognitive remediation;Balance training;Therapeutic exercise;Therapeutic activities;Patient/family education;Manual techniques;Dry needling;Passive range of motion;Energy conservation;Splinting;Vestibular    PT Next Visit Plan strength, balance    PT Home Exercise Plan see above    Consulted and Agree with Plan of Care Patient           Patient will benefit from skilled therapeutic intervention in order to improve the following deficits and impairments:  Abnormal gait, Cardiopulmonary status limiting activity, Decreased activity tolerance, Decreased balance, Decreased knowledge of precautions, Decreased endurance, Decreased coordination, Decreased knowledge of use of DME, Decreased mobility, Decreased safety awareness, Decreased range of motion, Decreased strength, Difficulty walking, Increased muscle spasms, Impaired perceived functional ability, Impaired flexibility, Improper body mechanics, Postural dysfunction, Pain  Visit Diagnosis: Unsteadiness on feet  Other abnormalities of gait and mobility     Problem List Patient Active Problem List   Diagnosis Date Noted   Head ache 12/03/2017   Cholecystitis with cholelithiasis 02/27/2016   Increased frequency of urination 01/27/2015   History of night sweats 05/01/2014   Dupuytren's contracture 07/18/2013   Inverted nipple 04/18/2013   Depression 04/23/2010   Fibromyalgia 04/23/2010   Hypercholesterolemia 04/23/2010   Hypertension, benign 04/23/2010    Hypothyroidism 04/23/2010   Irritable colon 04/23/2010   Osteoarthritis 04/23/2010   Janna Arch, PT, DPT   02/01/2020, 12:17 PM  [place] MAIN [facility] SERVICES [address] [place], [place], [address] Phone: [phone]   Fax:  [phone]  Name: Kendra Benton MRN: [id number] Date of Birth: 07-May-1941

## 2020-02-06 ENCOUNTER — Ambulatory Visit: Payer: Medicare Other

## 2020-02-08 ENCOUNTER — Ambulatory Visit: Payer: Medicare Other | Attending: Internal Medicine

## 2020-02-08 ENCOUNTER — Other Ambulatory Visit: Payer: Self-pay

## 2020-02-08 DIAGNOSIS — R2689 Other abnormalities of gait and mobility: Secondary | ICD-10-CM | POA: Diagnosis present

## 2020-02-08 DIAGNOSIS — R2681 Unsteadiness on feet: Secondary | ICD-10-CM | POA: Diagnosis not present

## 2020-02-08 NOTE — Therapy (Signed)
[place] MAIN [facility] SERVICES [address] [place], [place], [address] Phone: [phone]   Fax:  [phone]  Physical Therapy Treatment  Patient Details  Name: Kendra Benton MRN: [id number] Date of Birth: 01-06-1941 Referring Provider (PT): Jacklynn Barnacle    Encounter Date: 02/08/2020   PT End of Session - 02/08/20 1106    Visit Number 6    Number of Visits 16    Date for PT Re-Evaluation 02/28/20    Authorization Type 6/10 eval 01/03/20    PT Start Time 1100    PT Stop Time 1144    PT Time Calculation (min) 44 min    Equipment Utilized During Treatment Gait belt    Activity Tolerance Patient tolerated treatment well    Behavior During Therapy WFL for tasks assessed/performed           Past Medical History:  Diagnosis Date  . Allergy   . Anxiety   . GERD (gastroesophageal reflux disease)   . Headache   . History of kidney stones   . Hypertension   . Myocardial infarction ([place])    1997  . Osteoporosis   . Pneumonia     Past Surgical History:  Procedure Laterality Date  . ABDOMINAL HYSTERECTOMY     patient has ovaries  . APPENDECTOMY    . ARTERY BIOPSY Right 12/30/2017   Procedure: BIOPSY TEMPORAL ARTERY;  Surgeon: Katha Cabal, MD;  Location: [facility] ORS;  Service: Vascular;  Laterality: Right;  . ARTERY BIOPSY Left 06/18/2018   Procedure: BIOPSY TEMPORAL ARTERY;  Surgeon: Katha Cabal, MD;  Location: [facility] ORS;  Service: Vascular;  Laterality: Left;  . BACK SURGERY    . CARDIAC CATHETERIZATION    . CATARACT EXTRACTION W/ INTRAOCULAR LENS  IMPLANT, BILATERAL Bilateral 2010  . CHOLECYSTECTOMY N/A 02/28/2016   Procedure: LAPAROSCOPIC CHOLECYSTECTOMY WITH INTRAOPERATIVE CHOLANGIOGRAM;  Surgeon: Dia Crawford III, MD;  Location: [facility] ORS;  Service: General;  Laterality: N/A;  . LUMBAR FUSION  11/09/2016   L3-L5  . SPINE SURGERY     Disc  . TONSILLECTOMY      There were no vitals filed for this visit.    Subjective Assessment - 02/08/20 1104    Subjective Patient reports no falls or LOB since last session. Has been doing her HEP, still having some back pain but feels her balance is getting better.    Pertinent History Patient presents with SPC to physical therapy evaluation for balance/falls. Reports she has gotten more wobbly, two falls repeated last week with bruising of L face and knee. Went to the emergency room and was cleared with no fractures. Started to use a cane about 6 months ago because of uneven surfaces. Husband died 63 months ago. Patient has PMH of arthritis, bronchitis, cancer, fibromyalgia,  bilateral L>R Dupuytren's disease,  HTN, IBS, MI, pneumonia, schwannoma acousta, seborrhea, spinal cord stimulator implant. Saw pelvic floor PT in 2019 at this facility.    Limitations Lifting;Standing;Walking;House hold activities    How long can you sit comfortably? not limited except by back pain.    How long can you stand comfortably? needs to touch something with hands.    How long can you walk comfortably? has to furniture surf in the house.    Patient Stated Goals to not have to use cane.    Currently in Pain? Yes    Pain Score 3     Pain Location Back    Pain Orientation Lower  Pain Descriptors / Indicators Aching    Pain Type Chronic pain    Pain Onset 1 to 4 weeks ago    Pain Frequency Intermittent            Treatment:   cone taps w no UE support, more challenging RLE due to standing on LLE; 5x each side   Cone weave between 6 cones, frequent knocking cones over with LLEx 6 trials   Figure 4 weaving no cone knock over. X 4 trials  airex pad under feet sit to stand 10x with arms crossed  in hallway with CGA for safety: -vertical head turns 2x 86 ft with two near LOB, cues for keeping base of support wider -initiation/termiantion of ambulation for sudden stop stability 2x 86 ft, one near LOB      Standing with CGA next to support surface:  Speed ladder: one foot  each square no UE support for equal step length, foot positioning, and sequencing x 8 lengths of // bars   airex balance beam lateral stepping, min-mod A for maintaining COM 8x length of // bars    seated: -RTB around ankles: green ball between knees alternating IR/ER 15x each LE -swiss ball TrA activation 10x 6 second holds    Pt educated throughout session about proper posture and technique with exercises. Improved exercise technique, movement at target joints, use of target muscles after min to mod verbal, visual, tactile cues                        PT Education - 02/08/20 1105    Education Details exercise technique, body mechanics, stability    Person(s) Educated Patient    Methods Explanation;Demonstration;Tactile cues;Verbal cues    Comprehension Verbalized understanding;Returned demonstration;Verbal cues required;Tactile cues required            PT Short Term Goals - 01/03/20 1735      PT SHORT TERM GOAL #1   Title Patient will be independent in home exercise program to improve strength/mobility for better functional independence with ADLs.    Baseline 7/27: HEP given    Time 4    Period Weeks    Status New    Target Date 01/31/20             PT Long Term Goals - 01/03/20 1735      PT LONG TERM GOAL #1   Title Patient will increase FOTO score to equal to or greater than  62%   to demonstrate statistically significant improvement in mobility and quality of life.    Baseline 7/27: 60%    Time 8    Period Weeks    Status New    Target Date 02/28/20      PT LONG TERM GOAL #2   Title Patient (> 30 years old) will complete five times sit to stand test in < 15 seconds indicating an increased LE strength and improved balance    Baseline 7/27: 22.59 seconds    Time 8    Period Weeks    Target Date 02/28/20      PT LONG TERM GOAL #3   Title Patient will increase Berg Balance score by > 6 points (44/56)  to demonstrate decreased fall risk during  functional activities.    Baseline 7/27: 38/56    Time 8    Period Weeks    Target Date 02/28/20      PT LONG TERM GOAL #4   Title Patient  will increase 10 meter walk test to >1.46m/s as to improve gait speed for better community ambulation and to reduce fall risk.    Baseline 7/27: 0.24m/s w one near fall    Time 8    Period Weeks    Status New    Target Date 02/28/20                 Plan - 02/08/20 1255    Clinical Impression Statement Patient presents with excellent motivation to physical therapy session. Patient is challenged with left turning as well as placement of left foot, frequently resulting in knocking over of obstacles. Single limb stability of LLE is challenged compared to RLE. Patient will benefit from skilled physical therapy to increase LE strength, mobility, and stability for decreased fall risk and improved capacity for mobility.    Personal Factors and Comorbidities Age;Comorbidity 3+;Education;Past/Current Experience;Fitness;Social Background;Time since onset of injury/illness/exacerbation    Comorbidities arthritis, bronchitis, cancer, fibromyalgia,  bilateral L>R Dupuytren's disease,  HTN, IBS, MI, pneumonia, schwannoma acousta, seborrhea, spinal cord stimulator implant.    Examination-Activity Limitations Bathing;Bed Mobility;Bend;Caring for [employer];Locomotion Level;Lift;Hygiene/Grooming;Squat;Stairs;Stand;Toileting;Transfers    Examination-Participation Restrictions Church;Cleaning;Community Activity;Driving;Interpersonal Relationship;Meal Prep;Laundry;Shop;Volunteer;Yard Work    [employer] officer Evolving/Moderate complexity    Rehab Potential Fair    PT Frequency 2x / week    PT Duration 8 weeks    PT Treatment/Interventions ADLs/Self Care Home Management;Aquatic Therapy;Biofeedback;Canalith Repostioning;Ultrasound;Moist Heat;Traction;Electrical Stimulation;Iontophoresis 4mg /ml Dexamethasone;DME  Instruction;Gait training;Stair training;Functional mobility training;Neuromuscular re-education;Cognitive remediation;Balance training;Therapeutic exercise;Therapeutic activities;Patient/family education;Manual techniques;Dry needling;Passive range of motion;Energy conservation;Splinting;Vestibular    PT Next Visit Plan strength, balance    PT Home Exercise Plan see above    Consulted and Agree with Plan of Care Patient           Patient will benefit from skilled therapeutic intervention in order to improve the following deficits and impairments:  Abnormal gait, Cardiopulmonary status limiting activity, Decreased activity tolerance, Decreased balance, Decreased knowledge of precautions, Decreased endurance, Decreased coordination, Decreased knowledge of use of DME, Decreased mobility, Decreased safety awareness, Decreased range of motion, Decreased strength, Difficulty walking, Increased muscle spasms, Impaired perceived functional ability, Impaired flexibility, Improper body mechanics, Postural dysfunction, Pain  Visit Diagnosis: Unsteadiness on feet  Other abnormalities of gait and mobility     Problem List Patient Active Problem List   Diagnosis Date Noted  . Head ache 12/03/2017  . Cholecystitis with cholelithiasis 02/27/2016  . Increased frequency of urination 01/27/2015  . History of night sweats 05/01/2014  . Dupuytren's contracture 07/18/2013  . Inverted nipple 04/18/2013  . Depression 04/23/2010  . Fibromyalgia 04/23/2010  . Hypercholesterolemia 04/23/2010  . Hypertension, benign 04/23/2010  . Hypothyroidism 04/23/2010  . Irritable colon 04/23/2010  . Osteoarthritis 04/23/2010   Janna Arch, PT, DPT   02/08/2020, 12:57 PM  [place] MAIN [facility] SERVICES [address] [place], [place], [address] Phone: [phone]   Fax:  [phone]  Name: Kendra Benton MRN: [id number] Date of Birth: [birth year]

## 2020-02-15 ENCOUNTER — Ambulatory Visit: Payer: Medicare Other

## 2020-02-20 ENCOUNTER — Ambulatory Visit: Payer: Medicare Other

## 2020-02-20 ENCOUNTER — Other Ambulatory Visit: Payer: Self-pay

## 2020-02-20 DIAGNOSIS — R2689 Other abnormalities of gait and mobility: Secondary | ICD-10-CM

## 2020-02-20 DIAGNOSIS — R2681 Unsteadiness on feet: Secondary | ICD-10-CM | POA: Diagnosis not present

## 2020-02-20 NOTE — Therapy (Signed)
[place] MAIN [facility] SERVICES [address] [place], [place], [address] Phone: [phone]   Fax:  [phone]  Physical Therapy Treatment  Patient Details  Name: Kendra Benton MRN: [id number] Date of Birth: 05-03-1941 Referring Provider (PT): Jacklynn Barnacle    Encounter Date: 02/20/2020   PT End of Session - 02/20/20 0935    Visit Number 7    Number of Visits 16    Date for PT Re-Evaluation 02/28/20    Authorization Type 7/10 eval 01/03/20    PT Start Time 0930    PT Stop Time 2778    PT Time Calculation (min) 44 min    Equipment Utilized During Treatment Gait belt    Activity Tolerance Patient tolerated treatment well    Behavior During Therapy WFL for tasks assessed/performed           Past Medical History:  Diagnosis Date   Allergy    Anxiety    GERD (gastroesophageal reflux disease)    Headache    History of kidney stones    Hypertension    Myocardial infarction [facility])    1997   Osteoporosis    Pneumonia     Past Surgical History:  Procedure Laterality Date   ABDOMINAL HYSTERECTOMY     patient has ovaries   APPENDECTOMY     ARTERY BIOPSY Right 12/30/2017   Procedure: BIOPSY TEMPORAL ARTERY;  Surgeon: Katha Cabal, MD;  Location: [facility] OR[facility];  Service: Vascular;  Laterality: Right;   ARTERY BIOPSY Left 06/18/2018   Procedure: BIOPSY TEMPORAL ARTERY;  Surgeon: Katha Cabal, MD;  Location: [facility] OR[facility];  Service: Vascular;  Laterality: Left;   BACK SURGERY     CARDIAC CATHETERIZATION     CATARACT EXTRACTION W/ INTRAOCULAR LENS  IMPLANT, BILATERAL Bilateral 2010   CHOLECYSTECTOMY N/A 02/28/2016   Procedure: LAPAROSCOPIC CHOLECYSTECTOMY WITH INTRAOPERATIVE CHOLANGIOGRAM;  Surgeon: Dia Crawford III, MD;  Location: [facility] ORS;  Service: General;  Laterality: N/A;   LUMBAR FUSION  11/09/2016   L3-L5   SPINE SURGERY     Disc   TONSILLECTOMY      There were no vitals filed for this visit.    Subjective Assessment - 02/20/20 0933    Subjective Patient reports compliance with HEP, forgot her cane in the car today and was able to walk to the rehab gym without. Reports less frequent LOB, now only when turning head with ambulation , no falls however.    Pertinent History Patient presents with SPC to physical therapy evaluation for balance/falls. Reports she has gotten more wobbly, two falls repeated last week with bruising of L face and knee. Went to the emergency room and was cleared with no fractures. Started to use a cane about 6 months ago because of uneven surfaces. Husband died 71 months ago. Patient has PMH of arthritis, bronchitis, cancer, fibromyalgia,  bilateral L>R Dupuytrens disease,  HTN, IBS, MI, pneumonia, schwannoma acousta, seborrhea, spinal cord stimulator implant. Saw pelvic floor PT in 2019 at this facility.    Limitations Lifting;Standing;Walking;House hold activities    How long can you sit comfortably? not limited except by back pain.    How long can you stand comfortably? needs to touch something with hands.    How long can you walk comfortably? has to furniture surf in the house.    Patient Stated Goals to not have to use cane.    Currently in Pain? No/denies  Treatment:   Nustep Lvl 3 RPM>50 for cardiovascular support; 4 minutes for challenge.   in hallway with CGA for safety: -vertical head turns 2x 86 ft with two near LOB, cues for keeping base of support wider -initiation/termiantion of ambulation for sudden stop stability 2x 86 ft, one near LOB        Standing with CGA next to support surface:  Unstable surface: red matt: ambulation within // bars, no UE support for carryover to ambulation on grass and unstable surfaces x 8 trials   Unstable surface (matt) : step over two consecutive half foam rollers for object negotiation, occasional LOB and instability x 10 // bars   Speed ladder: one foot each square no UE support for equal step  length, foot positioning, and sequencing x 8 lengths of // bars    airex balance beam lateral stepping, min-mod A for maintaining COM 8x length of // bars ; no UE support   airex pad: one foot each color pad for modified tandem stance, slow horizontal head turns, 10x each foot placement ; 2 sets. Frequent pauses for re-stabilization , close CGA and occasional Min A required for maintaining COM.    Scanning room for cone placement for visual scan, safety awareness, and stabilization with horizontal and vertical head turns. Able to pick up from ground and reach up with CGA and one near LOB x 2 trials of 8 cones  10x STS w/o UE support  Pt educated throughout session about proper posture and technique with exercises. Improved exercise technique, movement at target joints, use of target muscles after min to mod verbal, visual, tactile cues                     PT Education - 02/20/20 0934    Education Details exercise technique, body mechanics, stability    Person(s) Educated Patient    Methods Explanation;Demonstration;Tactile cues;Verbal cues    Comprehension Verbalized understanding;Returned demonstration;Tactile cues required;Verbal cues required            PT Short Term Goals - 01/03/20 1735      PT SHORT TERM GOAL #1   Title Patient will be independent in home exercise program to improve strength/mobility for better functional independence with ADLs.    Baseline 7/27: HEP given    Time 4    Period Weeks    Status New    Target Date 01/31/20             PT Long Term Goals - 01/03/20 1735      PT LONG TERM GOAL #1   Title Patient will increase FOTO score to equal to or greater than  62%   to demonstrate statistically significant improvement in mobility and quality of life.    Baseline 7/27: 60%    Time 8    Period Weeks    Status New    Target Date 02/28/20      PT LONG TERM GOAL #2   Title Patient (> 81 years old) will complete five times sit to stand  test in < 15 seconds indicating an increased LE strength and improved balance    Baseline 7/27: 22.59 seconds    Time 8    Period Weeks    Target Date 02/28/20      PT LONG TERM GOAL #3   Title Patient will increase Berg Balance score by > 6 points (44/56)  to demonstrate decreased fall risk during functional activities.    Baseline 7/27:  38/56    Time 8    Period Weeks    Target Date 02/28/20      PT LONG TERM GOAL #4   Title Patient will increase 10 meter walk test to >1.23m/s as to improve gait speed for better community ambulation and to reduce fall risk.    Baseline 7/27: 0.74m/s w one near fall    Time 8    Period Weeks    Status New    Target Date 02/28/20                 Plan - 02/20/20 9417    Clinical Impression Statement Patient is challenged with unstable surfaces however ankle righting reactions improve with repetition, Maintaining a widened BOS is challenging with frequent regression towards narrow base with scissor step when fatigued. Patient will benefit from skilled physical therapy to increase LE strength, mobility, and stability for decreased fall risk and improved capacity for mobility.    Personal Factors and Comorbidities Age;Comorbidity 3+;Education;Past/Current Experience;Fitness;Social Background;Time since onset of injury/illness/exacerbation    Comorbidities arthritis, bronchitis, cancer, fibromyalgia,  bilateral L>R Dupuytrens disease,  HTN, IBS, MI, pneumonia, schwannoma acousta, seborrhea, spinal cord stimulator implant.    Examination-Activity Limitations Bathing;Bed Mobility;Bend;Caring for [employer];Locomotion Level;Lift;Hygiene/Grooming;Squat;Stairs;Stand;Toileting;Transfers    Examination-Participation Restrictions Church;Cleaning;Community Activity;Driving;Interpersonal Relationship;Meal Prep;Laundry;Shop;Volunteer;Yard Work    [employer] officer Evolving/Moderate complexity    Rehab Potential Fair     PT Frequency 2x / week    PT Duration 8 weeks    PT Treatment/Interventions ADLs/Self Care Home Management;Aquatic Therapy;Biofeedback;Canalith Repostioning;Ultrasound;Moist Heat;Traction;Electrical Stimulation;Iontophoresis 4mg /ml Dexamethasone;DME Instruction;Gait training;Stair training;Functional mobility training;Neuromuscular re-education;Cognitive remediation;Balance training;Therapeutic exercise;Therapeutic activities;Patient/family education;Manual techniques;Dry needling;Passive range of motion;Energy conservation;Splinting;Vestibular    PT Next Visit Plan strength, balance    PT Home Exercise Plan see above    Consulted and Agree with Plan of Care Patient           Patient will benefit from skilled therapeutic intervention in order to improve the following deficits and impairments:  Abnormal gait, Cardiopulmonary status limiting activity, Decreased activity tolerance, Decreased balance, Decreased knowledge of precautions, Decreased endurance, Decreased coordination, Decreased knowledge of use of DME, Decreased mobility, Decreased safety awareness, Decreased range of motion, Decreased strength, Difficulty walking, Increased muscle spasms, Impaired perceived functional ability, Impaired flexibility, Improper body mechanics, Postural dysfunction, Pain  Visit Diagnosis: Unsteadiness on feet  Other abnormalities of gait and mobility     Problem List Patient Active Problem List   Diagnosis Date Noted   Head ache 12/03/2017   Cholecystitis with cholelithiasis 02/27/2016   Increased frequency of urination 01/27/2015   History of night sweats 05/01/2014   Dupuytren's contracture 07/18/2013   Inverted nipple 04/18/2013   Depression 04/23/2010   Fibromyalgia 04/23/2010   Hypercholesterolemia 04/23/2010   Hypertension, benign 04/23/2010   Hypothyroidism 04/23/2010   Irritable colon 04/23/2010   Osteoarthritis 04/23/2010   Janna Arch, PT, DPT   02/20/2020,  10:14 AM  [place] [address] [place], [place], [address] Phone: [phone]   Fax:  [phone]  Name: Kendra Benton MRN: [id number] Date of Birth: June 25, 1940

## 2020-02-23 ENCOUNTER — Ambulatory Visit: Payer: Medicare Other

## 2020-02-27 ENCOUNTER — Other Ambulatory Visit: Payer: Self-pay

## 2020-02-27 ENCOUNTER — Ambulatory Visit: Payer: Medicare Other

## 2020-02-27 DIAGNOSIS — R2681 Unsteadiness on feet: Secondary | ICD-10-CM

## 2020-02-27 DIAGNOSIS — R2689 Other abnormalities of gait and mobility: Secondary | ICD-10-CM

## 2020-02-27 NOTE — Therapy (Signed)
[place] MAIN [facility] SERVICES [address] [place], [place], [address] Phone: [phone]   Fax:  [phone]  Physical Therapy Treatment / [place]  Patient Details  Name: Kendra Benton MRN: [id number] Date of Birth: 1940/07/27 Referring Provider (PT): Jacklynn Barnacle    Encounter Date: 02/27/2020   PT End of Session - 02/27/20 1048    Visit Number 8    Number of Visits 24    Date for PT Re-Evaluation 04/23/20    Authorization Type 8/10 eval 9/39/03, recert 0/09/23    PT Start Time 0930    PT Stop Time 3007    PT Time Calculation (min) 44 min    Equipment Utilized During Treatment Gait belt    Activity Tolerance Patient tolerated treatment well    Behavior During Therapy WFL for tasks assessed/performed           Past Medical History:  Diagnosis Date   Allergy    Anxiety    GERD (gastroesophageal reflux disease)    Headache    History of kidney stones    Hypertension    Myocardial infarction ([place])    1997   Osteoporosis    Pneumonia     Past Surgical History:  Procedure Laterality Date   ABDOMINAL HYSTERECTOMY     patient has ovaries   APPENDECTOMY     ARTERY BIOPSY Right 12/30/2017   Procedure: BIOPSY TEMPORAL ARTERY;  Surgeon: Katha Cabal, MD;  Location: [facility] ORS;  Service: Vascular;  Laterality: Right;   ARTERY BIOPSY Left 06/18/2018   Procedure: BIOPSY TEMPORAL ARTERY;  Surgeon: Katha Cabal, MD;  Location: [facility] ORS;  Service: Vascular;  Laterality: Left;   BACK SURGERY     CARDIAC CATHETERIZATION     CATARACT EXTRACTION W/ INTRAOCULAR LENS  IMPLANT, BILATERAL Bilateral 2010   CHOLECYSTECTOMY N/A 02/28/2016   Procedure: LAPAROSCOPIC CHOLECYSTECTOMY WITH INTRAOPERATIVE CHOLANGIOGRAM;  Surgeon: Dia Crawford III, MD;  Location: [facility] ORS;  Service: General;  Laterality: N/A;   LUMBAR FUSION  11/09/2016   L3-L5   SPINE SURGERY     Disc   TONSILLECTOMY      There were no vitals  filed for this visit.   Subjective Assessment - 02/27/20 0937    Subjective Patient notes missing last week's appointment due to oversleeping. Denies LOB or falls since last visit. Patient notes performing exercises in kitchen and balance has been improving. Patient had yearly follow up s/p R lobe resection and is stil waiting on CT results. She denies any chest pain or SOB at this time.    Pertinent History Patient presents with SPC to physical therapy evaluation for balance/falls. Reports she has gotten more wobbly, two falls repeated last week with bruising of L face and knee. Went to the emergency room and was cleared with no fractures. Started to use a cane about 6 months ago because of uneven surfaces. Husband died 44 months ago. Patient has PMH of arthritis, bronchitis, cancer, fibromyalgia,  bilateral L>R Dupuytrens disease,  HTN, IBS, MI, pneumonia, schwannoma acousta, seborrhea, spinal cord stimulator implant. Saw pelvic floor PT in 2019 at this facility.    Limitations Lifting;Standing;Walking;House hold activities    How long can you sit comfortably? not limited except by back pain.    How long can you stand comfortably? needs to touch something with hands.    How long can you walk comfortably? has to furniture surf in the house.    Patient Stated Goals to not have  to use cane.    Currently in Pain? Yes    Pain Score 6     Pain Location Back    Pain Orientation Lower    Pain Descriptors / Indicators Aching    Pain Type Chronic pain    Pain Onset 1 to 4 weeks ago    Pain Frequency Intermittent           Vitals at start of session: 146/58, 49bpm  NuStep 4 minutes, level 3 RPM >50 for cardiovascular training. Moist heat on lower back due to increased pain at beginning of session.  Goals: 5xSTS: 17.44 seconds w/o UE support  10MWT: 10.35 seconds = 0.44ms (community ambulator) BERG: 38/56 FOTO: 56%   Treat: Standing with 2# AW: - PF with BUE support on bar. SPT cues for  slow movements to maximize muscle recruitment. 10x - Marches with LUE support on bar. 10x each leg (20x total)  Standing at bar without weights: Step up/over hurdle with yardstick between feet to promote wider BOS during gait in order to prevent scissoring. CGA and chair behind for safety. Patient performs 10x on each leg, demonstrating more difficulty advancing LLE than RLE. Patient demonstrates near LOB x 3 when advancing with LLE, requiring min A from SPT to re-establish BOS.  Pt educated throughout session about proper posture and technique with exercises. Improved exercise technique, movement at target joints, use of target muscles after min to mod verbal, visual, tactile cues   [facility]PT Assessment - 02/27/20 0001      Berg Balance Test   Sit to Stand Able to stand without using hands and stabilize independently    Standing Unsupported Able to stand 2 minutes with supervision    Sitting with Back Unsupported but Feet Supported on Floor or Stool Able to sit safely and securely 2 minutes    Stand to Sit Sits safely with minimal use of hands    Transfers Able to transfer safely, minor use of hands    Standing Unsupported with Eyes Closed Able to stand 10 seconds with supervision    Standing Unsupported with Feet Together Able to place feet together independently and stand for 1 minute with supervision    From Standing, Reach Forward with Outstretched Arm Reaches forward but needs supervision    From Standing Position, Pick up Object from [place] pick up shoe, needs supervision    From Standing Position, Turn to Look Behind Over each Shoulder Looks behind from both sides and weight shifts well    Turn 360 Degrees Able to turn 360 degrees safely but slowly    Standing Unsupported, Alternately Place Feet on Step/Stool Able to complete 4 steps without aid or supervision    Standing Unsupported, One Foot in FONEOKbalance while stepping or standing    Standing on One Leg Tries to lift  leg/unable to hold 3 seconds but remains standing independently    Total Score 38                                  PT Education - 02/27/20 0913    Education Details exercise technique, body mechanics, breathing technique, GOALS, POC    Person(s) Educated Patient    Methods Explanation;Demonstration;Tactile cues;Verbal cues    Comprehension Verbalized understanding;Returned demonstration;Verbal cues required;Tactile cues required            PT Short Term Goals - 02/27/20 1305  PT SHORT TERM GOAL #1   Title Patient will be independent in home exercise program to improve strength/mobility for better functional independence with ADLs.    Baseline 7/27: HEP given 9/20: HEP compliant    Time 4    Period Weeks    Status Partially Met    Target Date 03/26/20             PT Long Term Goals - 02/27/20 1026      PT LONG TERM GOAL #1   Title Patient will increase FOTO score to equal to or greater than  62%   to demonstrate statistically significant improvement in mobility and quality of life.    Baseline 7/27: 60%, 9/20: 56%    Time 8    Period Weeks    Status On-going    Target Date 04/23/20      PT LONG TERM GOAL #2   Title Patient (> 52 years old) will complete five times sit to stand test in < 15 seconds indicating an increased LE strength and improved balance    Baseline 7/27: 22.59 seconds, 9/20: 17.44 seconds, no UE support    Time 8    Period Weeks    Status On-going    Target Date 04/23/20      PT LONG TERM GOAL #3   Title Patient will increase Berg Balance score by > 6 points (44/56)  to demonstrate decreased fall risk during functional activities.    Baseline 7/27: 38/56, 9/20: 38/56    Time 8    Period Weeks    Status On-going    Target Date 04/23/20      PT LONG TERM GOAL #4   Title Patient will increase 10 meter walk test to >1.33ms as to improve gait speed for better community ambulation and to reduce fall risk.    Baseline  7/27: 0.677m w one near fall, 9/20: 0.9791mwithout AD    Time 8    Period Weeks    Status On-going    Target Date 04/23/20      PT LONG TERM GOAL #5   Title Patient will increase six minute walk test distance to >1000 without instances of LOB in order to improve gait ability.    Baseline 9/20: not assessed this date    Time 8    Period Weeks    Status New    Target Date 04/23/20                 Plan - 02/27/20 1059    Clinical Impression Statement Patient is showing improvement in stability with gait and functional tasks, indicated by improvements in 10MWT and 5xSTS scores. Patient has progressed from limited community ambulator with 1 near LOB to community ambulator with 0 near LOB. Patient continues to display difficulty with higher level dynamic balance tasks and requires outside assistance to regain balance. Patient notes difficulty with tasks such as stepping over objects in the home, pushing/pulling garbage bins, and prolonged walking around the home. FOTO score decreased however through further questioning scores were affected by cognition instead of function. Patient shows potential for further advancement of strength and balance in skilled PT. Patient will benefit from skilled physical therapy to increase LE strength, mobility, and stability for decreased fall risk and improved capacity for mobility.    Personal Factors and Comorbidities Age;Comorbidity 3+;Education;Past/Current Experience;Fitness;Social Background;Time since onset of injury/illness/exacerbation    Comorbidities arthritis, bronchitis, cancer, fibromyalgia,  bilateral L>R Dupuytrens disease,  HTN, IBS, MI, pneumonia,  schwannoma acousta, seborrhea, spinal cord stimulator implant.    Examination-Activity Limitations Bathing;Bed Mobility;Bend;Caring for [employer];Locomotion Level;Lift;Hygiene/Grooming;Squat;Stairs;Stand;Toileting;Transfers    Examination-Participation Restrictions  Church;Cleaning;Community Activity;Driving;Interpersonal Relationship;Meal Prep;Laundry;Shop;Volunteer;Yard Work    Merchant navy officer Evolving/Moderate complexity    Rehab Potential Fair    PT Frequency 2x / week    PT Duration 8 weeks    PT Treatment/Interventions ADLs/Self Care Home Management;Aquatic Therapy;Biofeedback;Canalith Repostioning;Ultrasound;Moist Heat;Traction;Electrical Stimulation;Iontophoresis 56m/ml Dexamethasone;DME Instruction;Gait training;Stair training;Functional mobility training;Neuromuscular re-education;Cognitive remediation;Balance training;Therapeutic exercise;Therapeutic activities;Patient/family education;Manual techniques;Dry needling;Passive range of motion;Energy conservation;Splinting;Vestibular    PT Next Visit Plan strength, balance    PT Home Exercise Plan see above    Consulted and Agree with Plan of Care Patient           Patient will benefit from skilled therapeutic intervention in order to improve the following deficits and impairments:  Abnormal gait, Cardiopulmonary status limiting activity, Decreased activity tolerance, Decreased balance, Decreased knowledge of precautions, Decreased endurance, Decreased coordination, Decreased knowledge of use of DME, Decreased mobility, Decreased safety awareness, Decreased range of motion, Decreased strength, Difficulty walking, Increased muscle spasms, Impaired perceived functional ability, Impaired flexibility, Improper body mechanics, Postural dysfunction, Pain  Visit Diagnosis: Unsteadiness on feet  Other abnormalities of gait and mobility     Problem List Patient Active Problem List   Diagnosis Date Noted   Head ache 12/03/2017   Cholecystitis with cholelithiasis 02/27/2016   Increased frequency of urination 01/27/2015   History of night sweats 05/01/2014   Dupuytren's contracture 07/18/2013   Inverted nipple 04/18/2013   Depression 04/23/2010   Fibromyalgia 04/23/2010    Hypercholesterolemia 04/23/2010   Hypertension, benign 04/23/2010   Hypothyroidism 04/23/2010   Irritable colon 04/23/2010   Osteoarthritis 04/23/2010   KTonny Bollman SPT This entire session was performed under direct supervision and direction of a licensed therapist/therapist assistant . I have personally read, edited and approve of the note as written.  MJanna Arch PT, DPT   02/27/2020, 1:11 PM  [place] [facility] [address] [address]Phone: [phone]  Fax:  [phone] Name: Kendra SHEVLINMRN: [id number] of Birth: 606/02/1941

## 2020-03-01 ENCOUNTER — Other Ambulatory Visit: Payer: Self-pay

## 2020-03-01 ENCOUNTER — Ambulatory Visit: Payer: Medicare Other

## 2020-03-01 DIAGNOSIS — R2681 Unsteadiness on feet: Secondary | ICD-10-CM | POA: Diagnosis not present

## 2020-03-01 DIAGNOSIS — R2689 Other abnormalities of gait and mobility: Secondary | ICD-10-CM

## 2020-03-01 NOTE — Therapy (Signed)
[place] MAIN [facility] SERVICES [address] [place], [place], [address] Phone: [phone]   Fax:  [phone]  Physical Therapy Treatment  Patient Details  Name: Kendra Benton MRN: [id number] Date of Birth: [birth year] Referring Provider (PT): Jacklynn Barnacle    Encounter Date: 03/01/2020   PT End of Session - 03/01/20 1227    Visit Number 9    Number of Visits 24    Date for PT Re-Evaluation 04/23/20    Authorization Type 9/10 eval 5/68/12, recert 7/51/70    PT Start Time 36    PT Stop Time 0174    PT Time Calculation (min) 44 min    Equipment Utilized During Treatment Gait belt    Activity Tolerance Patient tolerated treatment well    Behavior During Therapy WFL for tasks assessed/performed           Past Medical History:  Diagnosis Date   Allergy    Anxiety    GERD (gastroesophageal reflux disease)    Headache    History of kidney stones    Hypertension    Myocardial infarction [facility])    1997   Osteoporosis    Pneumonia     Past Surgical History:  Procedure Laterality Date   ABDOMINAL HYSTERECTOMY     patient has ovaries   APPENDECTOMY     ARTERY BIOPSY Right 12/30/2017   Procedure: BIOPSY TEMPORAL ARTERY;  Surgeon: Katha Cabal, MD;  Location: [facility] ORS;  Service: Vascular;  Laterality: Right;   ARTERY BIOPSY Left 06/18/2018   Procedure: BIOPSY TEMPORAL ARTERY;  Surgeon: Katha Cabal, MD;  Location: [facility] ORS;  Service: Vascular;  Laterality: Left;   BACK SURGERY     CARDIAC CATHETERIZATION     CATARACT EXTRACTION W/ INTRAOCULAR LENS  IMPLANT, BILATERAL Bilateral 2010   CHOLECYSTECTOMY N/A 02/28/2016   Procedure: LAPAROSCOPIC CHOLECYSTECTOMY WITH INTRAOPERATIVE CHOLANGIOGRAM;  Surgeon: Dia Crawford III, MD;  Location: [facility] ORS;  Service: General;  Laterality: N/A;   LUMBAR FUSION  11/09/2016   L3-L5   SPINE SURGERY     Disc   TONSILLECTOMY      There were no vitals filed for  this visit.   Subjective Assessment - 03/01/20 1224    Subjective Patient reports no falls or LOB since last session. Has been compliant with HEP and did yoga yesterday to help with her low back pain.    Pertinent History Patient presents with SPC to physical therapy evaluation for balance/falls. Reports she has gotten more wobbly, two falls repeated last week with bruising of L face and knee. Went to the emergency room and was cleared with no fractures. Started to use a cane about 6 months ago because of uneven surfaces. Husband died 63 months ago. Patient has PMH of arthritis, bronchitis, cancer, fibromyalgia,  bilateral L>R Dupuytrens disease,  HTN, IBS, MI, pneumonia, schwannoma acousta, seborrhea, spinal cord stimulator implant. Saw pelvic floor PT in 2019 at this facility.    Limitations Lifting;Standing;Walking;House hold activities    How long can you sit comfortably? not limited except by back pain.    How long can you stand comfortably? needs to touch something with hands.    How long can you walk comfortably? has to furniture surf in the house.    Patient Stated Goals to not have to use cane.    Currently in Pain? Yes    Pain Score 2     Pain Location Back    Pain Orientation Lower  Pain Descriptors / Indicators Aching    Pain Type Chronic pain    Pain Onset 1 to 4 weeks ago    Pain Frequency Intermittent               in hallway with CGA for safety: -vertical head turns 2x 86 ft with two near LOB, cues for keeping base of support wider -horizontal head turns 3x 86 ft ; multiple near LOB with increased scissor stepping  -backwards ambulation 60 ft, 2 near LOB.  -initiation/termination of ambulation for sudden stop stability 2x 86 ft, one near LOB        Standing with CGA next to support surface:  airex pad: UE reach onto wall 10x each UE airex pad: eyes closed 30 seconds airex pad under feet: sit to stand 10x no UE support   Half foam roller df/pf with UE support  20x, cues for body mechanics  standing heel raise 20x;BUE support   Standing with # 4 ankle weight: CGA for stability -Hip extension with Bilateral upper extremity support, cueing for neutral hip alignment, upright posture for optimal muscle recruitment, and sequencing, 10x each LE,  -Hip abduction with bilateral upper extremity support, cueing for neutral foot alignment for correct muscle activation, 10x each LE -Hip flexion with bilateral upper extremity support, cueing for body mechanics, speed of muscle recruitment for optimal strengthening and stabilization 10x each LE -Hamstring curl with bilateral upper extremity support, cueing for knee alignment for recruitment of hamstring musculature, 10x each LE   Seated with # 4lb ankle weights  -Seated marches with upright posture, back away from back of chair for abdominal/trunk activation/stabilization, 10x each LE -Seated LAQ with 3 second holds, 10x each LE, cueing for muscle activation and sequencing for neutral alignment -Seated IR/ER with cueing for stabilizing knee placement with lateral foot movement for optimal muscle recruitment, 10x each LE   Seated large swiss ball forward trunk lean for low back pain relief 10x 10 second holds  Pt educated throughout session about proper posture and technique with exercises. Improved exercise technique, movement at target joints, use of target muscles after min to mod verbal, visual, tactile cues                          PT Education - 03/01/20 1226    Education Details exercise technique, dual task with ambulation, body mechanics    Person(s) Educated Patient    Methods Explanation;Demonstration;Tactile cues;Verbal cues    Comprehension Verbalized understanding;Returned demonstration;Verbal cues required;Tactile cues required            PT Short Term Goals - 02/27/20 1305      PT SHORT TERM GOAL #1   Title Patient will be independent in home exercise program to  improve strength/mobility for better functional independence with ADLs.    Baseline 7/27: HEP given 9/20: HEP compliant    Time 4    Period Weeks    Status Partially Met    Target Date 03/26/20             PT Long Term Goals - 02/27/20 1026      PT LONG TERM GOAL #1   Title Patient will increase FOTO score to equal to or greater than  62%   to demonstrate statistically significant improvement in mobility and quality of life.    Baseline 7/27: 60%, 9/20: 56%    Time 8    Period Weeks    Status On-going  Target Date 04/23/20      PT LONG TERM GOAL #2   Title Patient (> 49 years old) will complete five times sit to stand test in < 15 seconds indicating an increased LE strength and improved balance    Baseline 7/27: 22.59 seconds, 9/20: 17.44 seconds, no UE support    Time 8    Period Weeks    Status On-going    Target Date 04/23/20      PT LONG TERM GOAL #3   Title Patient will increase Berg Balance score by > 6 points (44/56)  to demonstrate decreased fall risk during functional activities.    Baseline 7/27: 38/56, 9/20: 38/56    Time 8    Period Weeks    Status On-going    Target Date 04/23/20      PT LONG TERM GOAL #4   Title Patient will increase 10 meter walk test to >1.25ms as to improve gait speed for better community ambulation and to reduce fall risk.    Baseline 7/27: 0.621m w one near fall, 9/20: 0.9743mwithout AD    Time 8    Period Weeks    Status On-going    Target Date 04/23/20      PT LONG TERM GOAL #5   Title Patient will increase six minute walk test distance to >1000 without instances of LOB in order to improve gait ability.    Baseline 9/20: not assessed this date    Time 8    Period Weeks    Status New    Target Date 04/23/20                 Plan - 03/01/20 1229    Clinical Impression Statement Patient continues to be challenged with ambulation and dual task with frequent LOB and scissor stepping pattern requiring occasional Min A  to retain COM. Strengthening and ability to clear feet is improving however with decrease episodes of foot drag. Patient will benefit from skilled physical therapy to increase LE strength, mobility, and stability for decreased fall risk and improved capacity for mobility.    Personal Factors and Comorbidities Age;Comorbidity 3+;Education;Past/Current Experience;Fitness;Social Background;Time since onset of injury/illness/exacerbation    Comorbidities arthritis, bronchitis, cancer, fibromyalgia,  bilateral L>R Dupuytrens disease,  HTN, IBS, MI, pneumonia, schwannoma acousta, seborrhea, spinal cord stimulator implant.    Examination-Activity Limitations Bathing;Bed Mobility;Bend;Caring for [employer]comotion Level;Lift;Hygiene/Grooming;Squat;Stairs;Stand;Toileting;Transfers    Examination-Participation Restrictions Church;Cleaning;Community Activity;Driving;Interpersonal Relationship;Meal Prep;Laundry;Shop;Volunteer;Yard Work    StaMerchant navy officerolving/Moderate complexity    Rehab Potential Fair    PT Frequency 2x / week    PT Duration 8 weeks    PT Treatment/Interventions ADLs/Self Care Home Management;Aquatic Therapy;Biofeedback;Canalith Repostioning;Ultrasound;Moist Heat;Traction;Electrical Stimulation;Iontophoresis 4mg75m Dexamethasone;DME Instruction;Gait training;Stair training;Functional mobility training;Neuromuscular re-education;Cognitive remediation;Balance training;Therapeutic exercise;Therapeutic activities;Patient/family education;Manual techniques;Dry needling;Passive range of motion;Energy conservation;Splinting;Vestibular    PT Next Visit Plan strength, balance    PT Home Exercise Plan see above    Consulted and Agree with Plan of Care Patient           Patient will benefit from skilled therapeutic intervention in order to improve the following deficits and impairments:  Abnormal gait, Cardiopulmonary status limiting activity,  Decreased activity tolerance, Decreased balance, Decreased knowledge of precautions, Decreased endurance, Decreased coordination, Decreased knowledge of use of DME, Decreased mobility, Decreased safety awareness, Decreased range of motion, Decreased strength, Difficulty walking, Increased muscle spasms, Impaired perceived functional ability, Impaired flexibility, Improper body mechanics, Postural dysfunction, Pain  Visit Diagnosis: Unsteadiness on feet  Other  abnormalities of gait and mobility     Problem List Patient Active Problem List   Diagnosis Date Noted   Head ache 12/03/2017   Cholecystitis with cholelithiasis 02/27/2016   Increased frequency of urination 01/27/2015   History of night sweats 05/01/2014   Dupuytren's contracture 07/18/2013   Inverted nipple 04/18/2013   Depression 04/23/2010   Fibromyalgia 04/23/2010   Hypercholesterolemia 04/23/2010   Hypertension, benign 04/23/2010   Hypothyroidism 04/23/2010   Irritable colon 04/23/2010   Osteoarthritis 04/23/2010   Janna Arch, PT, DPT   03/01/2020, 12:31 PM  [place] MAIN [facility] SERVICES [address] [place], [place], [address] Phone: [phone]   Fax:  [phone]  Name: ALANAH SAKUMA MRN: [id number] Date of Birth: 12-10-1940

## 2020-03-05 ENCOUNTER — Ambulatory Visit: Payer: Medicare Other

## 2020-03-05 DIAGNOSIS — R2681 Unsteadiness on feet: Secondary | ICD-10-CM

## 2020-03-05 DIAGNOSIS — R2689 Other abnormalities of gait and mobility: Secondary | ICD-10-CM

## 2020-03-05 NOTE — Therapy (Addendum)
[place] MAIN [facility] SERVICES [address] [place], [place], [address] Phone: [phone]   Fax:  [phone]  Physical Therapy Treatment Physical Therapy Progress Note   Dates of reporting period  01/03/20   to   03/05/20  Patient Details  Name: Kendra Benton MRN: [id number] Date of Birth: Apr 18, 1941 Referring Provider (PT): Jacklynn Barnacle    Encounter Date: 03/05/2020   PT End of Session - 03/05/20 0921    Visit Number 10    Number of Visits 24    Date for PT Re-Evaluation 04/23/20    Authorization Type 10/10 eval 8/45/36, recert 4/68/03    PT Start Time 0930    PT Stop Time 1014    PT Time Calculation (min) 44 min    Equipment Utilized During Treatment Gait belt    Activity Tolerance Patient tolerated treatment well    Behavior During Therapy WFL for tasks assessed/performed           Past Medical History:  Diagnosis Date  . Allergy   . Anxiety   . GERD (gastroesophageal reflux disease)   . Headache   . History of kidney stones   . Hypertension   . Myocardial infarction ([place])    1997  . Osteoporosis   . Pneumonia     Past Surgical History:  Procedure Laterality Date  . ABDOMINAL HYSTERECTOMY     patient has ovaries  . APPENDECTOMY    . ARTERY BIOPSY Right 12/30/2017   Procedure: BIOPSY TEMPORAL ARTERY;  Surgeon: Katha Cabal, MD;  Location: [facility] ORS;  Service: Vascular;  Laterality: Right;  . ARTERY BIOPSY Left 06/18/2018   Procedure: BIOPSY TEMPORAL ARTERY;  Surgeon: Katha Cabal, MD;  Location: [facility] ORS;  Service: Vascular;  Laterality: Left;  . BACK SURGERY    . CARDIAC CATHETERIZATION    . CATARACT EXTRACTION W/ INTRAOCULAR LENS  IMPLANT, BILATERAL Bilateral 2010  . CHOLECYSTECTOMY N/A 02/28/2016   Procedure: LAPAROSCOPIC CHOLECYSTECTOMY WITH INTRAOPERATIVE CHOLANGIOGRAM;  Surgeon: Dia Crawford III, MD;  Location: [facility] ORS;  Service: General;  Laterality: N/A;  . LUMBAR FUSION  11/09/2016   L3-L5   . SPINE SURGERY     Disc  . TONSILLECTOMY      There were no vitals filed for this visit.   Subjective Assessment - 03/05/20 0929    Subjective Patient presents to clinic without cane, saying she has not had to use it since Thursday. Denies LOB or falls since last visit. Notes that biggest challenge is walking from car to house with groceries up incline.    Pertinent History Patient presents with SPC to physical therapy evaluation for balance/falls. Reports she has gotten more wobbly, two falls repeated last week with bruising of L face and knee. Went to the emergency room and was cleared with no fractures. Started to use a cane about 6 months ago because of uneven surfaces. Husband died 88 months ago. Patient has PMH of arthritis, bronchitis, cancer, fibromyalgia,  bilateral L>R Dupuytren's disease,  HTN, IBS, MI, pneumonia, schwannoma acousta, seborrhea, spinal cord stimulator implant. Saw pelvic floor PT in 2019 at this facility.    Limitations Lifting;Standing;Walking;House hold activities    How long can you sit comfortably? not limited except by back pain.    How long can you stand comfortably? needs to touch something with hands.    How long can you walk comfortably? has to furniture surf in the house.    Patient Stated Goals to not have  to use cane.    Currently in Pain? Yes    Pain Score 5     Pain Location Back    Pain Orientation Lower    Pain Descriptors / Indicators Aching    Pain Type Chronic pain    Pain Radiating Towards Patient notes pain intermittently radiates to L hip    Pain Onset 1 to 4 weeks ago    Pain Frequency Intermittent               Treatment:  - NuStep 4 minutes, level 4 for BLE and BUE coordination, RPM >50 for cardiovascular training. Moist heat donned to lower back due to pain levels.  Floor Transfer:  Facilitated floor transfer training with red mat and low table to mimic couch. SPT educates on proper sequencing of movement for safest method  of ascent. Patient performs 3x at [facility] for safety, utilizing tall kneel > half kneel technique. Further education provided on calling for help after potential injurious fall at home and keeping cell phone on person at all times.   Seated: - Core activation with green physioball. 15x with 3 second holds.   Seated with 3# ankle weights  -Seated marches with upright posture, back away from back of chair for abdominal/trunk activation/stabilization, 10x each LE -Seated LAQ with 3 second holds, 10x each LE, cueing for muscle activation and sequencing for neutral alignment - Seated DF/PF rocking 10x each direction. SPT cues for slow movements to maximize muscle activation. -Seated IR with cueing for stabilizing knee placement with lateral foot m ovement for optimal muscle recruitment, 10x each LE   Supine: - Lower trunk rotations with green physioball. 10x each side with 3 second holds. - Attempted supine marches with 3# ankle weights. Patient displays inc difficulty and c/o back spasm when lifting LLE. Patient performs 2x on each leg then discontinued due to pain.       Patient's condition has the potential to improve in response to therapy. Maximum improvement is yet to be obtained. The anticipated improvement is attainable and reasonable in a generally predictable time.  Patient reports she is feeling steadier and stronger but continues to be challenged with inclines/declines and carrying groceries.   Please see note on 02/27/20 for further details on goal progression.                  PT Education - 03/05/20 0920    Education Details exercise technique, body mechanics    Person(s) Educated Patient    Methods Explanation;Demonstration;Tactile cues;Verbal cues    Comprehension Verbalized understanding;Returned demonstration;Verbal cues required;Tactile cues required            PT Short Term Goals - 02/27/20 1305      PT SHORT TERM GOAL #1   Title Patient will be  independent in home exercise program to improve strength/mobility for better functional independence with ADLs.    Baseline 7/27: HEP given 9/20: HEP compliant    Time 4    Period Weeks    Status Partially Met    Target Date 03/26/20             PT Long Term Goals - 02/27/20 1026      PT LONG TERM GOAL #1   Title Patient will increase FOTO score to equal to or greater than  62%   to demonstrate statistically significant improvement in mobility and quality of life.    Baseline 7/27: 60%, 9/20: 56%    Time 8  Period Weeks    Status On-going    Target Date 04/23/20      PT LONG TERM GOAL #2   Title Patient (> 47 years old) will complete five times sit to stand test in < 15 seconds indicating an increased LE strength and improved balance    Baseline 7/27: 22.59 seconds, 9/20: 17.44 seconds, no UE support    Time 8    Period Weeks    Status On-going    Target Date 04/23/20      PT LONG TERM GOAL #3   Title Patient will increase Berg Balance score by > 6 points (44/56)  to demonstrate decreased fall risk during functional activities.    Baseline 7/27: 38/56, 9/20: 38/56    Time 8    Period Weeks    Status On-going    Target Date 04/23/20      PT LONG TERM GOAL #4   Title Patient will increase 10 meter walk test to >1.38ms as to improve gait speed for better community ambulation and to reduce fall risk.    Baseline 7/27: 0.670m w one near fall, 9/20: 0.9741mwithout AD    Time 8    Period Weeks    Status On-going    Target Date 04/23/20      PT LONG TERM GOAL #5   Title Patient will increase six minute walk test distance to >1000 without instances of LOB in order to improve gait ability.    Baseline 9/20: not assessed this date    Time 8    Period Weeks    Status New    Target Date 04/23/20                 Plan - 03/05/20 1031    Clinical Impression Statement Interventions limited by patient back pain this session. Patient displayed understanding of proper  floor transfer technique, preferring to place both hands on stable surface to rise instead of hand on knee. Patient has c/o L back spasm x2 during supine strengthening exercise. Patient displays good execution of seated strengthening exercises without provocation of pain. Patient will benefit from skilled physical therapy to increase LE strength, mobility, and stability for decreased fall risk and improved capacity for mobility.    Personal Factors and Comorbidities Age;Comorbidity 3+;Education;Past/Current Experience;Fitness;Social Background;Time since onset of injury/illness/exacerbation    Comorbidities arthritis, bronchitis, cancer, fibromyalgia,  bilateral L>R Dupuytren's disease,  HTN, IBS, MI, pneumonia, schwannoma acousta, seborrhea, spinal cord stimulator implant.    Examination-Activity Limitations Bathing;Bed Mobility;Bend;Caring for Oth[employer] Buycomotion Level;Lift;Hygiene/Grooming;Squat;Stairs;Stand;Toileting;Transfers    Examination-Participation Restrictions Church;Cleaning;Community Activity;Driving;Interpersonal Relationship;Meal Prep;Laundry;Shop;Volunteer;Yard Work    StaMerchant navy officerolving/Moderate complexity    Rehab Potential Fair    PT Frequency 2x / week    PT Duration 8 weeks    PT Treatment/Interventions ADLs/Self Care Home Management;Aquatic Therapy;Biofeedback;Canalith Repostioning;Ultrasound;Moist Heat;Traction;Electrical Stimulation;Iontophoresis 4mg46m Dexamethasone;DME Instruction;Gait training;Stair training;Functional mobility training;Neuromuscular re-education;Cognitive remediation;Balance training;Therapeutic exercise;Therapeutic activities;Patient/family education;Manual techniques;Dry needling;Passive range of motion;Energy conservation;Splinting;Vestibular    PT Next Visit Plan strength, balance    PT Home Exercise Plan see above    Consulted and Agree with Plan of Care Patient           Patient will benefit  from skilled therapeutic intervention in order to improve the following deficits and impairments:  Abnormal gait, Cardiopulmonary status limiting activity, Decreased activity tolerance, Decreased balance, Decreased knowledge of precautions, Decreased endurance, Decreased coordination, Decreased knowledge of use of DME, Decreased mobility, Decreased safety awareness, Decreased range of motion, Decreased strength,  Difficulty walking, Increased muscle spasms, Impaired perceived functional ability, Impaired flexibility, Improper body mechanics, Postural dysfunction, Pain  Visit Diagnosis: Unsteadiness on feet  Other abnormalities of gait and mobility     Problem List Patient Active Problem List   Diagnosis Date Noted  . Head ache 12/03/2017  . Cholecystitis with cholelithiasis 02/27/2016  . Increased frequency of urination 01/27/2015  . History of night sweats 05/01/2014  . Dupuytren's contracture 07/18/2013  . Inverted nipple 04/18/2013  . Depression 04/23/2010  . Fibromyalgia 04/23/2010  . Hypercholesterolemia 04/23/2010  . Hypertension, benign 04/23/2010  . Hypothyroidism 04/23/2010  . Irritable colon 04/23/2010  . Osteoarthritis 04/23/2010   Tonny Bollman, SPT  This entire session was performed under direct supervision and direction of a licensed therapist/therapist assistant . I have personally read, edited and approve of the note as written.  Janna Arch, PT, DPT   03/05/2020, 12:20 PM  [place] MAIN [facility] SERVICES [address] [place], [place], [address] Phone: [phone]   Fax:  [phone]  Name: Kendra Benton MRN: [id number] Date of Birth: 10/23/1940

## 2020-03-08 ENCOUNTER — Ambulatory Visit: Payer: Medicare Other

## 2020-03-13 ENCOUNTER — Emergency Department
Admission: EM | Admit: 2020-03-13 | Discharge: 2020-03-13 | Disposition: A | Discharge status: Home / Self Care | Payer: Medicare Other | Attending: Emergency Medicine | Admitting: Emergency Medicine

## 2020-03-13 ENCOUNTER — Other Ambulatory Visit: Payer: Self-pay

## 2020-03-13 ENCOUNTER — Emergency Department: Payer: Medicare Other

## 2020-03-13 DIAGNOSIS — Z87891 Personal history of nicotine dependence: Secondary | ICD-10-CM | POA: Insufficient documentation

## 2020-03-13 DIAGNOSIS — R0789 Other chest pain: Secondary | ICD-10-CM | POA: Insufficient documentation

## 2020-03-13 DIAGNOSIS — Z79899 Other long term (current) drug therapy: Secondary | ICD-10-CM | POA: Diagnosis not present

## 2020-03-13 DIAGNOSIS — E039 Hypothyroidism, unspecified: Secondary | ICD-10-CM | POA: Insufficient documentation

## 2020-03-13 DIAGNOSIS — R0602 Shortness of breath: Secondary | ICD-10-CM | POA: Diagnosis not present

## 2020-03-13 DIAGNOSIS — I10 Essential (primary) hypertension: Secondary | ICD-10-CM | POA: Diagnosis not present

## 2020-03-13 DIAGNOSIS — Z91040 Latex allergy status: Secondary | ICD-10-CM | POA: Insufficient documentation

## 2020-03-13 LAB — TROPONIN I (HIGH SENSITIVITY)
Troponin I (High Sensitivity): 7 ng/L (ref <18)
Troponin I (High Sensitivity): 6 ng/L (ref <18)

## 2020-03-13 LAB — BASIC METABOLIC PANEL
Anion gap: 13 (ref 5–15)
BUN: 11 mg/dL (ref 8–23)
CO2: 26 mmol/L (ref 22–32)
Calcium: 8.8 mg/dL — ABNORMAL LOW (ref 8.9–10.3)
Chloride: 99 mmol/L (ref 98–111)
Creatinine, Ser: 0.71 mg/dL (ref 0.44–1.00)
GFR calc non Af Amer: >60 mL/min (ref >60)
Glucose, Bld: 95 mg/dL (ref 70–99)
Potassium: 3 mmol/L — ABNORMAL LOW (ref 3.5–5.1)
Sodium: 138 mmol/L (ref 135–145)

## 2020-03-13 LAB — CBC
HCT: 38.3 % (ref 36.0–46.0)
Hemoglobin: 12.7 g/dL (ref 12.0–15.0)
MCH: 31.6 pg (ref 26.0–34.0)
MCHC: 33.2 g/dL (ref 30.0–36.0)
MCV: 95.3 fL (ref 80.0–100.0)
Platelets: 407 10*3/uL — ABNORMAL HIGH (ref 150–400)
RBC: 4.02 MIL/uL (ref 3.87–5.11)
RDW: 12.6 % (ref 11.5–15.5)
WBC: 9.2 10*3/uL (ref 4.0–10.5)
nRBC: 0 % (ref 0.0–0.2)

## 2020-03-13 LAB — FIBRIN DERIVATIVES D-DIMER (ARMC ONLY): Fibrin derivatives D-dimer (ARMC): 586.23 ng/mL (FEU) — ABNORMAL HIGH (ref 0.00–499.00)

## 2020-03-13 MED ORDER — POTASSIUM CHLORIDE CRYS ER 20 MEQ PO TBCR
40.0000 meq | EXTENDED_RELEASE_TABLET | Freq: Once | ORAL | Status: AC
  Administered 2020-03-13: 40 meq via ORAL
  Filled 2020-03-13: qty 2

## 2020-03-13 NOTE — Discharge Instructions (Signed)
Your EKG, Chest xray, and blood tests today were all reassuring.  Your d-dimer level was 586, which is borderline.  Please follow up with your doctor at your scheduled appointment next week.  If your symptoms have not resolved, you should talk to your doctor about having a CT scan done to make sure you don't have a blood clot in your chest.    Return to the ER if you start feeling worse, especially worsening chest pain or shortness of breath.

## 2020-03-13 NOTE — ED Triage Notes (Signed)
Pt states she woke up with SOB and chest pain this morning. Pt is in NAD, states "It feels like when I get bronchitis". States she just received her covid boaster vaccine on Friday.

## 2020-03-13 NOTE — ED Provider Notes (Signed)
[facility] Emergency Department Provider Note  ____________________________________________  Time seen: Approximately 7:10 PM  I have reviewed the triage vital signs and the nursing notes.   HISTORY  Chief Complaint Chest Pain    HPI Kendra Benton is a 79 y.o. female with a history of GERD, hypertension, lung cancer who comes ED complaining of shortness of breath and aching chest pain that started 3 days ago after receiving a Covid booster shot.  Intermittent, when present and is worsened by breathing.  Not exertional.  Nonradiating.  Mild to moderate intensity.  Today, she also started having fatigue and generalized body aches so she comes the ED for evaluation.     Past Medical History:  Diagnosis Date  . Allergy   . Anxiety   . GERD (gastroesophageal reflux disease)   . Headache   . History of kidney stones   . Hypertension   . Myocardial infarction ([place])    1997  . Osteoporosis   . Pneumonia      Patient Active Problem List   Diagnosis Date Noted  . Head ache 12/03/2017  . Cholecystitis with cholelithiasis 02/27/2016  . Increased frequency of urination 01/27/2015  . History of night sweats 05/01/2014  . Dupuytren's contracture 07/18/2013  . Inverted nipple 04/18/2013  . Depression 04/23/2010  . Fibromyalgia 04/23/2010  . Hypercholesterolemia 04/23/2010  . Hypertension, benign 04/23/2010  . Hypothyroidism 04/23/2010  . Irritable colon 04/23/2010  . Osteoarthritis 04/23/2010     Past Surgical History:  Procedure Laterality Date  . ABDOMINAL HYSTERECTOMY     patient has ovaries  . APPENDECTOMY    . ARTERY BIOPSY Right 12/30/2017   Procedure: BIOPSY TEMPORAL ARTERY;  Surgeon: Katha Cabal, MD;  Location: [facility] ORS;  Service: Vascular;  Laterality: Right;  . ARTERY BIOPSY Left 06/18/2018   Procedure: BIOPSY TEMPORAL ARTERY;  Surgeon: Katha Cabal, MD;  Location: [facility] ORS;  Service: Vascular;  Laterality: Left;  . BACK  SURGERY    . CARDIAC CATHETERIZATION    . CATARACT EXTRACTION W/ INTRAOCULAR LENS  IMPLANT, BILATERAL Bilateral 2010  . CHOLECYSTECTOMY N/A 02/28/2016   Procedure: LAPAROSCOPIC CHOLECYSTECTOMY WITH INTRAOPERATIVE CHOLANGIOGRAM;  Surgeon: Dia Crawford III, MD;  Location: [facility] ORS;  Service: General;  Laterality: N/A;  . LUMBAR FUSION  11/09/2016   L3-L5  . SPINE SURGERY     Disc  . TONSILLECTOMY       Prior to Admission medications   Medication Sig Start Date End Date Taking? Authorizing Provider  acetaminophen (TYLENOL) 500 MG tablet Take 1,000 mg by mouth 2 (two) times daily as needed for moderate pain or headache.     [provider]  dicyclomine (BENTYL) 10 MG capsule Take 10 mg by mouth 3 (three) times daily as needed for spasms.  08/17/14   [provider]  DULoxetine (CYMBALTA) 60 MG capsule Take 60 mg by mouth every evening.     [provider]  estradiol (ESTRACE) 0.5 MG tablet Take 1 mg by mouth daily.  04/15/17   [provider]  gabapentin (NEURONTIN) 300 MG capsule Take 600 mg by mouth 3 (three) times daily.  04/26/18   [provider]  Hypromellose (ARTIFICIAL TEARS OP) Place 1-2 drops into both eyes daily as needed (for dry eyes).    [provider]  lisinopril (PRINIVIL,ZESTRIL) 10 MG tablet Take 10 mg by mouth daily. 06/14/18 06/14/19  [provider]  metoprolol succinate (TOPROL-XL) 100 MG 24 hr tablet Take 50 mg by  mouth daily.  11/29/17   [provider]  omeprazole (PRILOSEC) 20 MG capsule Take 20 mg by mouth daily.    [provider]  ondansetron (ZOFRAN ODT) 4 MG disintegrating tablet Take 1 tablet (4 mg total) by mouth every 8 (eight) hours as needed. 08/28/18   Gregor Hams, MD  temazepam (RESTORIL) 30 MG capsule Take 30 mg by mouth at bedtime.     [provider]  traMADol (ULTRAM) 50 MG tablet Take 1 tablet (50 mg total) by mouth every 6 (six) hours as needed. 06/28/18   Schnier,  Dolores Lory, MD  triamterene-hydrochlorothiazide (DYAZIDE) 37.5-25 MG per capsule Take 1 capsule by mouth at bedtime.     [provider]     Allergies Ciprofloxacin, Levofloxacin, Albuterol, Amlodipine, and Latex   Family History  Problem Relation Age of Onset  . Heart disease Mother   . Arthritis Mother   . Cancer Mother 55       lymphoma  . Heart disease Father   . Cancer Father 83       prostate  . Cancer Sister 79       pancreatic    Social History Social History   Tobacco Use  . Smoking status: Former Smoker    Packs/day: 1.00    Years: 20.00    Pack years: 20.00    Types: Cigarettes    Quit date: 1980    Years since quitting: 41.7  . Smokeless tobacco: Never Used  Vaping Use  . Vaping Use: Never used  Substance Use Topics  . Alcohol use: Yes    Alcohol/week: 10.0 standard drinks    Types: 10 Shots of liquor per week    Comment: 10 drinks per week  . Drug use: No    Review of Systems  Constitutional:   No fever or chills.  ENT:   No sore throat. No rhinorrhea. Cardiovascular: Positive chest pain as above without syncope. Respiratory: Positive shortness of breath without cough. Gastrointestinal:   Negative for abdominal pain, vomiting and diarrhea.  Musculoskeletal:   Negative for focal pain or swelling All other systems reviewed and are negative except as documented above in ROS and HPI.  ____________________________________________   PHYSICAL EXAM:  VITAL SIGNS: ED Triage Vitals  Enc Vitals Group     BP 03/13/20 1512 (!) 176/74     Pulse Rate 03/13/20 1512 (!) 51     Resp 03/13/20 1512 17     Temp 03/13/20 1512 98 F (36.7 C)     Temp Source 03/13/20 1512 Oral     SpO2 03/13/20 1512 96 %     Weight 03/13/20 1506 124 lb (56.2 kg)     Height 03/13/20 1506 5\' 2"  (1.575 m)     Head Circumference --      Peak Flow --      Pain Score 03/13/20 1506 5     Pain Loc --      Pain Edu? --      Excl. in [place]? --     Vital signs reviewed,  nursing assessments reviewed.   Constitutional:   Alert and oriented. Non-toxic appearance. Eyes:   Conjunctivae are normal. EOMI. PERRL. ENT      Head:   Normocephalic and atraumatic.      Nose:   Wearing a mask.      Mouth/Throat:   Wearing a mask.      Neck:   No meningismus. Full ROM. Hematological/Lymphatic/Immunilogical:   No cervical  lymphadenopathy. Cardiovascular:   RRR. Symmetric bilateral radial and DP pulses.  No murmurs. Cap refill less than 2 seconds. Respiratory:   Normal respiratory effort without tachypnea/retractions. Breath sounds are clear and equal bilaterally. No wheezes/rales/rhonchi. Gastrointestinal:   Soft and nontender. Non distended. There is no CVA tenderness.  No rebound, rigidity, or guarding.  Musculoskeletal:   Normal range of motion in all extremities. No joint effusions.  No lower extremity tenderness.  No edema. Neurologic:   Normal speech and language.  Motor grossly intact. No acute focal neurologic deficits are appreciated.  Skin:    Skin is warm, dry and intact. No rash noted.  No petechiae, purpura, or bullae.  ____________________________________________    LABS (pertinent positives/negatives) (all labs ordered are listed, but only abnormal results are displayed) Labs Reviewed  BASIC METABOLIC PANEL - Abnormal; Notable for the following components:      Result Value   Potassium 3.0 (*)    Calcium 8.8 (*)    All other components within normal limits  CBC - Abnormal; Notable for the following components:   Platelets 407 (*)    All other components within normal limits  FIBRIN DERIVATIVES D-DIMER ([facility] ONLY) - Abnormal; Notable for the following components:   Fibrin derivatives D-dimer [facility]) 586.23 (*)    All other components within normal limits  TROPONIN I (HIGH SENSITIVITY)  TROPONIN I (HIGH SENSITIVITY)   ____________________________________________   EKG  Interpreted by me Sinus bradycardia rate of 53, normal axis.   First-degree AV block.  Right bundle branch block with associated repolarization abnormality.  No acute ischemic changes.  No evidence of right heart strain.  ____________________________________________    RADIOLOGY  DG Chest 2 View  Result Date: 03/13/2020 CLINICAL DATA:  Chest pain. EXAM: CHEST - 2 VIEW COMPARISON:  Chest radiograph 06/11/2017 FINDINGS: The cardiomediastinal contours are normal. Calcified granuloma at the left lung base, unchanged. The previous nodular density at the right lung apex is no longer seen. There is a linear opacity in the right suprahilar lung that was not seen on prior exam. Pulmonary vasculature is normal. No consolidation, pleural effusion, or pneumothorax. No acute osseous abnormalities are seen. Chronic compression fracture at the thoracolumbar junction just above lumbar fusion hardware, partially included. IMPRESSION: Linear opacity in the right suprahilar lung may be atelectasis or scarring. Previous nodular density at the right lung apex is no longer seen. Electronically Signed   By: Keith Rake M.D.   On: 03/13/2020 16:03    ____________________________________________   PROCEDURES Procedures  ____________________________________________  DIFFERENTIAL DIAGNOSIS   Vaccine reaction, non-STEMI, pulmonary embolism, viral URI  CLINICAL IMPRESSION / ASSESSMENT AND PLAN / ED COURSE  Medications ordered in the ED: Medications  potassium chloride SA (KLOR-CON) CR tablet 40 mEq (40 mEq Oral Given 03/13/20 1727)    Pertinent labs & imaging results that were available during my care of the patient were reviewed by me and considered in my medical decision making (see chart for details).  Kendra Benton was evaluated in Emergency Department on 03/13/2020 for the symptoms described in the history of present illness. She was evaluated in the context of the global COVID-19 pandemic, which necessitated consideration that the patient might be at risk for  infection with the SARS-CoV-2 virus that causes COVID-19. Institutional protocols and algorithms that pertain to the evaluation of patients at risk for COVID-19 are in a state of rapid change based on information released by regulatory bodies including the CDC and federal and state organizations. These  policies and algorithms were followed during the patient's care in the ED.   Patient presents with atypical symptoms, intermittent and pleuritic.  Vital signs are normal. Considering the patient's symptoms, medical history, and physical examination today, I have low suspicion for ACS, PE, TAD, pneumothorax, carditis, mediastinitis, pneumonia, CHF, or sepsis.  Due to age, work-up was undertaken with serial troponins which are negative, chest x-ray unremarkable, EKG reassuring.  D-dimer was 586, and using a age based stratification approach, this is low risk given her atypical symptoms and normal vital signs.  Engaged in shared decision-making with the patient regarding this level, offering to go ahead and obtain a CT scan of the chest now versus let her monitor her symptoms and follow-up with her doctor at her scheduled appointment next week, patient opts to be discharged now to follow-up with her doctor next week.  She will return to the ER if her symptoms worsen.      ____________________________________________   FINAL CLINICAL IMPRESSION(S) / ED DIAGNOSES    Final diagnoses:  Atypical chest pain     ED Discharge Orders    None      Portions of this note were generated with dragon dictation software. Dictation errors may occur despite best attempts at proofreading.   Carrie Mew, MD 03/13/20 [id number]

## 2020-03-14 ENCOUNTER — Ambulatory Visit: Payer: Medicare Other | Admitting: Physical Therapy

## 2020-03-19 ENCOUNTER — Ambulatory Visit: Payer: Medicare Other | Admitting: Physical Therapy

## 2020-03-21 ENCOUNTER — Encounter: Payer: Self-pay | Admitting: Physical Therapy

## 2020-03-21 ENCOUNTER — Ambulatory Visit: Payer: Medicare Other | Attending: Internal Medicine | Admitting: Physical Therapy

## 2020-03-21 ENCOUNTER — Other Ambulatory Visit: Payer: Self-pay

## 2020-03-21 DIAGNOSIS — R2681 Unsteadiness on feet: Secondary | ICD-10-CM | POA: Diagnosis not present

## 2020-03-21 DIAGNOSIS — R2689 Other abnormalities of gait and mobility: Secondary | ICD-10-CM | POA: Diagnosis present

## 2020-03-21 NOTE — Therapy (Signed)
[facility] [address] [place], [place], [address] Phone: [phone]   Fax:  [phone]  Physical Therapy Treatment/ Discharge  Patient Details  Name: Kendra Benton MRN: [id number] Date of Birth: [birth year] Referring Provider (PT): Jacklynn Barnacle    Encounter Date: 03/21/2020   PT End of Session - 03/21/20 1216    Visit Number 11    Number of Visits 24    Date for PT Re-Evaluation 04/23/20    Authorization Type 5/49 recert 02/02/40    PT Start Time 1145    PT Stop Time 1227    PT Time Calculation (min) 42 min    Equipment Utilized During Treatment Gait belt    Activity Tolerance Patient tolerated treatment well    Behavior During Therapy [facility] for tasks assessed/performed           Past Medical History:  Diagnosis Date  . Allergy   . Anxiety   . GERD (gastroesophageal reflux disease)   . Headache   . History of kidney stones   . Hypertension   . Myocardial infarction ([place])    1997  . Osteoporosis   . Pneumonia     Past Surgical History:  Procedure Laterality Date  . ABDOMINAL HYSTERECTOMY     patient has ovaries  . APPENDECTOMY    . ARTERY BIOPSY Right 12/30/2017   Procedure: BIOPSY TEMPORAL ARTERY;  Surgeon: Katha Cabal, MD;  Location: [facility];  Service: Vascular;  Laterality: Right;  . ARTERY BIOPSY Left 06/18/2018   Procedure: BIOPSY TEMPORAL ARTERY;  Surgeon: Katha Cabal, MD;  Location: [facility] OR[facility];  Service: Vascular;  Laterality: Left;  . BACK SURGERY    . CARDIAC CATHETERIZATION    . CATARACT EXTRACTION W/ INTRAOCULAR LENS  IMPLANT, BILATERAL Bilateral 2010  . CHOLECYSTECTOMY N/A 02/28/2016   Procedure: LAPAROSCOPIC CHOLECYSTECTOMY WITH INTRAOPERATIVE CHOLANGIOGRAM;  Surgeon: Dia Crawford III, MD;  Location: [facility];  Service: General;  Laterality: N/A;  . LUMBAR FUSION  11/09/2016   L3-L5  . SPINE SURGERY     Disc  . TONSILLECTOMY      There were no vitals filed for this  visit.   Subjective Assessment - 03/21/20 1149    Subjective Patient presents without cane this session, stating that she feels like she does not need it. Patient notes "a  few" near LOB when turning, but is able to stabilize. Patient is leaving for [place] and wishes to discharge from therapy until she returns.    Pertinent History Patient presents with SPC to physical therapy evaluation for balance/falls. Reports she has gotten more wobbly, two falls repeated last week with bruising of L face and knee. Went to the emergency room and was cleared with no fractures. Started to use a cane about 6 months ago because of uneven surfaces. Husband died 54 months ago. Patient has PMH of arthritis, bronchitis, cancer, fibromyalgia,  bilateral L>R Dupuytren's disease,  HTN, IBS, MI, pneumonia, schwannoma acousta, seborrhea, spinal cord stimulator implant. Saw pelvic floor PT in 2019 at this facility.    Limitations Lifting;Standing;Walking;House hold activities    How long can you sit comfortably? not limited except by back pain.    How long can you stand comfortably? needs to touch something with hands.    How long can you walk comfortably? has to furniture surf in the house.    Patient Stated Goals to not have to use cane.    Currently in Pain? No/denies  Pain Onset 1 to 4 weeks ago             Goals: 5xSTS: 15.6seconds Berg: 49/56 10MWT: self-selected: 1.9ms without AD, fast: 1.21m without AD 6MWT: [id number] near LOB x 1 FOTO: 70%    PT advises for patient to continue with HEP compliance while away. Education provided on fatigue management and sitting down when feeling tired in order to reduce fall risk. Patient notes going to the beach in time away. PT educates on taking smaller steps, being near people, and avoiding sand inclines for increased stability.                   PT Education - 03/21/20 1242    Education Details exercise technique, fatigue management, HEP  review, goals, gait safety    Person(s) Educated Patient    Methods Explanation;Demonstration;Tactile cues;Verbal cues    Comprehension Verbalized understanding;Returned demonstration;Verbal cues required;Tactile cues required            PT Short Term Goals - 03/21/20 1233      PT SHORT TERM GOAL #1   Title Patient will be independent in home exercise program to improve strength/mobility for better functional independence with ADLs.    Baseline 7/27: HEP given 9/20: HEP compliant, 10/13: HEP compliant with no questions    Time 4    Period Weeks    Status Achieved    Target Date 03/26/20             PT Long Term Goals - 03/21/20 1231      PT LONG TERM GOAL #1   Title Patient will increase FOTO score to equal to or greater than  62%   to demonstrate statistically significant improvement in mobility and quality of life.    Baseline 7/27: 60%, 9/20: 56% 10/13: 70%    Time 8    Period Weeks    Status Achieved    Target Date 04/23/20      PT LONG TERM GOAL #2   Title Patient (> 60 56ars old) will complete five times sit to stand test in < 15 seconds indicating an increased LE strength and improved balance    Baseline 7/27: 22.59 seconds, 9/20: 17.44 seconds, no UE support 10/13: 15.6 seconds, no UE support    Time 8    Period Weeks    Status Not Met    Target Date 04/23/20      PT LONG TERM GOAL #3   Title Patient will increase Berg Balance score by > 6 points (44/56)  to demonstrate decreased fall risk during functional activities.    Baseline 7/27: 38/56, 9/20: 38/56, 10/13: 49/56    Time 8    Period Weeks    Status Achieved    Target Date 04/23/20      PT LONG TERM GOAL #4   Title Patient will increase 10 meter walk test to >1.81m/46ms to improve gait speed for better community ambulation and to reduce fall risk.    Baseline 7/27: 0.80m/45mone near fall, 9/20: 0.90m/s11mhout AD, 10/13: 1.19m/s 59mout AD    Time 8    Period Weeks    Status Achieved    Target Date  04/23/20      PT LONG TERM GOAL #5   Title Patient will increase six minute walk test distance to >1000 without instances of LOB in order to improve gait ability.    Baseline 9/20: not assessed this date, 10/13: 1165ft wi31fne near  LOB    Time 8    Period Weeks    Status Achieved    Target Date 04/23/20                 Plan - 03/21/20 1239    Clinical Impression Statement Patient states wish for self-discharge this date as she is going to [place] for an unknown amount of time. Patient has met most balance goals and is able to perform functional mobility tasks confidently with minimal signs of instability. Patient demonstrates verbal understanding of HEP without questions. PT has advised patient to return to skilled therapy after trip for continued balance training if desired. I would be happy to treat this patient in the future as needed.    Personal Factors and Comorbidities Age;Comorbidity 3+;Education;Past/Current Experience;Fitness;Social Background;Time since onset of injury/illness/exacerbation    Comorbidities arthritis, bronchitis, cancer, fibromyalgia,  bilateral L>R Dupuytren's disease,  HTN, IBS, MI, pneumonia, schwannoma acousta, seborrhea, spinal cord stimulator implant.    Examination-Activity Limitations Bathing;Bed Mobility;Bend;Caring for [employer];Locomotion Level;Lift;Hygiene/Grooming;Squat;Stairs;Stand;Toileting;Transfers    Examination-Participation Restrictions Church;Cleaning;Community Activity;Driving;Interpersonal Relationship;Meal Prep;Laundry;Shop;Volunteer;Yard Work    [employer] Evolving/Moderate complexity    Rehab Potential Fair    PT Frequency 2x / week    PT Duration 8 weeks    PT Treatment/Interventions ADLs/Self Care Home Management;Aquatic Therapy;Biofeedback;Canalith Repostioning;Ultrasound;Moist Heat;Traction;Electrical Stimulation;Iontophoresis 76m/ml Dexamethasone;DME Instruction;Gait  training;Stair training;Functional mobility training;Neuromuscular re-education;Cognitive remediation;Balance training;Therapeutic exercise;Therapeutic activities;Patient/family education;Manual techniques;Dry needling;Passive range of motion;Energy conservation;Splinting;Vestibular    PT Next Visit Plan strength, balance    PT Home Exercise Plan see above    Consulted and Agree with Plan of Care Patient           Patient will benefit from skilled therapeutic intervention in order to improve the following deficits and impairments:  Abnormal gait, Cardiopulmonary status limiting activity, Decreased activity tolerance, Decreased balance, Decreased knowledge of precautions, Decreased endurance, Decreased coordination, Decreased knowledge of use of DME, Decreased mobility, Decreased safety awareness, Decreased range of motion, Decreased strength, Difficulty walking, Increased muscle spasms, Impaired perceived functional ability, Impaired flexibility, Improper body mechanics, Postural dysfunction, Pain  Visit Diagnosis: Unsteadiness on feet  Other abnormalities of gait and mobility     Problem List Patient Active Problem List   Diagnosis Date Noted  . Head ache 12/03/2017  . Cholecystitis with cholelithiasis 02/27/2016  . Increased frequency of urination 01/27/2015  . History of night sweats 05/01/2014  . Dupuytren's contracture 07/18/2013  . Inverted nipple 04/18/2013  . Depression 04/23/2010  . Fibromyalgia 04/23/2010  . Hypercholesterolemia 04/23/2010  . Hypertension, benign 04/23/2010  . Hypothyroidism 04/23/2010  . Irritable colon 04/23/2010  . Osteoarthritis 04/23/2010   KTonny Bollman SPT  This entire session was performed under direct supervision and direction of a licensed therapist/therapist assistant . I have personally read, edited and approve of the note as written. MHillis Range PT, DPT 03/21/20 2:40 PM   03/21/2020, 2:38 PM  [facility] [address] [address]Phone: [phone]  Fax:  [phone] Name: Kendra LUBASMRN: [id number] of Birth: [birth year]

## 2020-03-28 ENCOUNTER — Ambulatory Visit: Payer: Medicare Other | Admitting: Physical Therapy

## 2020-04-02 ENCOUNTER — Ambulatory Visit: Payer: Medicare Other | Admitting: Physical Therapy

## 2020-04-04 ENCOUNTER — Ambulatory Visit: Payer: Medicare Other | Admitting: Physical Therapy

## 2020-05-07 ENCOUNTER — Ambulatory Visit: Payer: Medicare Other | Admitting: Pain Medicine

## 2020-05-16 ENCOUNTER — Encounter: Payer: Self-pay | Admitting: Pain Medicine

## 2020-05-16 ENCOUNTER — Ambulatory Visit
Admission: RE | Admit: 2020-05-16 | Discharge: 2020-05-16 | Disposition: A | Discharge status: Home / Self Care | Payer: Medicare Other | Source: Ambulatory Visit | Attending: Pain Medicine | Admitting: Pain Medicine

## 2020-05-16 ENCOUNTER — Other Ambulatory Visit: Payer: Self-pay

## 2020-05-16 ENCOUNTER — Ambulatory Visit (HOSPITAL_BASED_OUTPATIENT_CLINIC_OR_DEPARTMENT_OTHER): Payer: Medicare Other | Admitting: Pain Medicine

## 2020-05-16 VITALS — BP 134/65 | HR 70 | Temp 97.2°F | Resp 18 | Ht 62.0 in | Wt 122.0 lb

## 2020-05-16 DIAGNOSIS — R0789 Other chest pain: Secondary | ICD-10-CM

## 2020-05-16 DIAGNOSIS — R0781 Pleurodynia: Secondary | ICD-10-CM | POA: Insufficient documentation

## 2020-05-16 DIAGNOSIS — M25551 Pain in right hip: Secondary | ICD-10-CM

## 2020-05-16 DIAGNOSIS — G8929 Other chronic pain: Secondary | ICD-10-CM

## 2020-05-16 DIAGNOSIS — M961 Postlaminectomy syndrome, not elsewhere classified: Secondary | ICD-10-CM | POA: Insufficient documentation

## 2020-05-16 DIAGNOSIS — M25552 Pain in left hip: Secondary | ICD-10-CM

## 2020-05-16 DIAGNOSIS — Z789 Other specified health status: Secondary | ICD-10-CM | POA: Insufficient documentation

## 2020-05-16 DIAGNOSIS — M899 Disorder of bone, unspecified: Secondary | ICD-10-CM | POA: Insufficient documentation

## 2020-05-16 DIAGNOSIS — G894 Chronic pain syndrome: Secondary | ICD-10-CM | POA: Insufficient documentation

## 2020-05-16 DIAGNOSIS — H9193 Unspecified hearing loss, bilateral: Secondary | ICD-10-CM | POA: Insufficient documentation

## 2020-05-16 DIAGNOSIS — R202 Paresthesia of skin: Secondary | ICD-10-CM | POA: Insufficient documentation

## 2020-05-16 DIAGNOSIS — M25562 Pain in left knee: Secondary | ICD-10-CM | POA: Insufficient documentation

## 2020-05-16 DIAGNOSIS — Z79899 Other long term (current) drug therapy: Secondary | ICD-10-CM | POA: Insufficient documentation

## 2020-05-16 DIAGNOSIS — M79605 Pain in left leg: Secondary | ICD-10-CM

## 2020-05-16 DIAGNOSIS — R200 Anesthesia of skin: Secondary | ICD-10-CM | POA: Insufficient documentation

## 2020-05-16 DIAGNOSIS — M545 Low back pain, unspecified: Secondary | ICD-10-CM | POA: Insufficient documentation

## 2020-05-16 DIAGNOSIS — H9190 Unspecified hearing loss, unspecified ear: Secondary | ICD-10-CM | POA: Insufficient documentation

## 2020-05-16 NOTE — Progress Notes (Addendum)
Patient: Kendra Benton  Service Category: E/M  Provider: Gaspar Cola, MD  DOB: 1940/10/07  DOS: 05/16/2020  Referring Provider: Jacklynn Barnacle  MRN: [id number]  Setting: Ambulatory outpatient  PCP: Jacklynn Barnacle, MD  Type: New Patient  Specialty: Interventional Pain Management    Location: Office  Delivery: Face-to-face     Primary Reason(s) for Visit: Encounter for initial evaluation of one or more chronic problems (new to examiner) potentially causing chronic pain, and posing a threat to normal musculoskeletal function. (Level of risk: High) CC: Back Pain (low) and Hip Pain (bilateral)  HPI  Kendra Benton is a 79 y.o. year old, female patient, who comes for the first time to our practice referred by Jacklynn Barnacle,* for our initial evaluation of her chronic pain. She has Cholecystitis with cholelithiasis; Depression; Dupuytren's contracture; Fibromyalgia; History of night sweats; Hypercholesterolemia; Essential hypertension; Hypothyroidism; Inverted nipple; Irritable colon; Osteoarthritis; Increased frequency of urination; Head ache; Anxiety; Atypical chest pain; Carcinoma, lung, right ([place]); Chronic low back pain (1ry area of Pain) (Bilateral) (L>R) w/o sciatica; Mass of upper lobe of right lung; Panic attack as reaction to stress; S/P partial lobectomy of lung; Sinus bradycardia; Hyperlipidemia; Chronic nonintractable headache; Chronic pain syndrome; Pharmacologic therapy; Disorder of skeletal system; Problems influencing health status; Failed back surgical syndrome; Chronic hip pain (2ry area of Pain) (Bilateral) (R>L); Chronic lower extremity pain (Left); Numbness and tingling of both feet (intermittent/recurrent); Pain in rib (Bilateral) (R>L) (intermittent); Chronic knee pain (Left); Patellar pain (Left) (chronic, intermittent); and Hard of hearing on their problem list. Today she comes in for evaluation of her Back Pain (low) and Hip Pain (bilateral)  Pain  Assessment: Location: Lower Back Radiating: radiates down left leg to knee on the side Onset: More than a month ago Duration: Chronic pain Quality: Aching Severity: 4 /10 (subjective, self-reported pain score)  Effect on ADL: I move alot slower Timing: Constant Modifying factors: tylenol and advil BP: 134/65  HR: 70  Onset and Duration: Gradual and Present longer than 3 months Cause of pain: Trauma Severity: Getting worse, NAS-11 at its worse: 8/10, NAS-11 at its best: 2/10 and NAS-11 on the average: 4/10 Timing: Not influenced by the time of the day and After activity or exercise Aggravating Factors: Bending, Climbing, Kneeling, Lifiting, Prolonged standing, Walking and Walking uphill Alleviating Factors: Hot packs, Lying down, Resting and physical therapy Associated Problems: Fatigue, Inability to control bladder (urine), Spasms, Tingling and Weakness Quality of Pain: Aching, Exhausting, Tender and Tingling Previous Examinations or Tests: Bone scan, CT scan, MRI scan, Nerve block, X-rays and Neurosurgical evaluation Previous Treatments: Physical Therapy, Pool exercises, Steroid treatments by mouth, Stretching exercises and Trigger point injections  This is a known patient of mine.  The last time I saw her was around 12/20/2013 when I did a bilateral lumbar spinal cord stimulator implant for her pain.  After that, she did not come back and apparently she went to Dr. Zadie Rhine at the [facility] where she had her stimulator removed.  She then underwent a back surgery by Dr. Edrick Oh and later a second back surgery for fusion, approximately 6 years ago at the [facility] by Dr. Zadie Rhine.  She was then referred to the [facility] pain clinic by her physician.  At that pain clinic she received some injections which she indicates that never worked as well as the ones that we had done for her.  Because her last visit was in 2015, and our electronic medical record was changed after that,  I do not  have access today to those old notes.  However, we had the patient sign a release form to get the notes from the [facility] pain clinic as well as the operative report from the [id number].  I have also instructed my staff to search the old electronic medical record for her notes so that I can review them.  The patient indicates her primary pain to be that of the lower back, bilaterally (L>R).  She has undergone 2 back surgeries in addition to the bilateral spinal cord stimulator implant that we had done.  She does not have a recent x-ray of her lumbar spine.  The patient's secondary area pain is that of the hips, bilaterally (R>L) she denies any hip surgery, and she does indicate having had an injection at the [facility] pain clinic but apparently it was of the SI joint.  She refers that the benefits from that lasted only 1 to 2 days.  She was told that her pain is secondary to scar tissue around her fusion rods.  The patient's third area pain is that of the left lower extremity.  This pain is described to be intermittent.  She also describes having bilateral greater toe numbness which is also intermittent.  The pain in the left leg goes down through the posterior and then lateral aspect of the leg into the area just anterior to the knee joint and the anterolateral, distal aspect of the left patella.  The patient's fourth area pain is that of tenderness around the lower rib cage, bilaterally, (R>L).  She describes that that pain started when she fell and bruised her rib cage approximately 4 years ago.  This is not a constant pain, but over time it tends to come and go, but primarily it feels tender to palpation.  She indicates having had some x-rays that were negative for any type of fractures.  Finally, the patient's last area (fifth) of pain is that of the left knee.  She describes the pain as being across her patella and also being intermittent.  She denies any surgeries, joint injections, or any  recent x-rays.  Addendum: The surgeons name is Dr. Joie Bimler.  This surgery was done on 11/10/2014 and the preoperative diagnosis was lumbar radiculopathy and degenerative scoliosis.  The operative report indicates that the procedure was a minimally invasive direct lateral interbody fusion L4-5 with PEEK interbody cage, bone morphogenic protein and grafton DBF.  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  During that particular appointment they documented that the patient has a history of an L3-L5 fusion.  They also documented that she had a fall on December 2018.  And their evaluation they indicated the patient had an MRI of the lumbar spine without contrast done on 04/17/2016 which indicated that she had that interval L3-5 fusion without any residual stenosis.  She also had mild left neuroforaminal stenosis at L5-S1.  The disc protrusion at this level seem to have decreased in size since 2015.  She also had evidence of a chronic L2 compression fracture.  Today I took the time to provide the patient with information regarding my pain practice. The patient was informed that my practice is divided into two sections: an interventional pain management section, as well as a completely  separate and distinct medication management section. I explained that I have procedure days for my interventional therapies, and evaluation days for follow-ups and medication management. Because of the amount of documentation required during both, they are kept separated. This means that there is the possibility that she may be scheduled for a procedure on one day, and medication management the next. I have also informed her that because of staffing and facility limitations, I no longer take patients for medication management  only. To illustrate the reasons for this, I gave the patient the example of surgeons, and how inappropriate it would be to refer a patient to his/her care, just to write for the post-surgical antibiotics on a surgery done by a different surgeon.   Because interventional pain management is my board-certified specialty, the patient was informed that joining my practice means that they are open to any and all interventional therapies. I made it clear that this does not mean that they will be forced to have any procedures done. What this means is that I believe interventional therapies to be essential part of the diagnosis and proper management of chronic pain conditions. Therefore, patients not interested in these interventional alternatives will be better served under the care of a different practitioner.  The patient was also made aware of my Comprehensive Pain Management Safety Guidelines where by joining my practice, they limit all of their nerve blocks and joint injections to those done by our practice, for as long as we are retained to manage their care.   Historic Controlled Substance Pharmacotherapy Review  PMP and historical list of controlled substances: Temazepam 30 mg; oxycodone/APAP 5/325; tramadol 50 mg Current opioid analgesics:  None MME/day: 0 mg/day  Historical Monitoring: The patient  reports no history of drug use. List of all UDS Test(s): No results found for: MDMA, COCAINSCRNUR, [place], [place], CANNABQUANT, THCU, [place] List of other Serum/Urine Drug Screening Test(s):  No results found for: AMPHSCRSER, BARBSCRSER, BENZOSCRSER, COCAINSCRSER, COCAINSCRNUR, PCPSCRSER, PCPQUANT, THCSCRSER, THCU, CANNABQUANT, OPIATESCRSER, OXYSCRSER, PROPOXSCRSER, ETH Historical Background Evaluation: [place] PMP: PDMP reviewed during this encounter. Online review of the past 27-monthperiod conducted.             PMP NARX Score Report:  Narcotic: 180 Sedative: 321 Stimulant: 000 [place] Department of  public safety, offender search: (Editor, commissioningInformation) Non-contributory Risk Assessment Profile: Aberrant behavior: None observed or detected today Risk factors for fatal opioid overdose: None identified today PMP NARX Overdose Risk Score: 050 Fatal overdose hazard ratio (HR): Calculation deferred Non-fatal overdose hazard ratio (HR): Calculation deferred Risk of opioid abuse or dependence: 0.7-3.0% with doses ? 36 MME/day and 6.1-26% with doses ? 120 MME/day. Substance use disorder (SUD) risk level: See below Personal History of Substance Abuse (SUD-Substance use disorder):  Alcohol: Negative  Illegal Drugs: Negative  Rx Drugs: Negative  ORT Risk Level calculation: Low Risk  Opioid Risk Tool - 05/16/20 0949      Family History of Substance Abuse   Alcohol Negative    Illegal Drugs Negative    Rx Drugs Negative      Personal History of Substance Abuse   Alcohol Negative    Illegal Drugs Negative    Rx Drugs Negative      Age   Age between 127-45years  No      History of Preadolescent Sexual Abuse   History of Preadolescent Sexual Abuse Negative or Female      Psychological Disease   Psychological Disease Negative    Depression Negative  Total Score   Opioid Risk Tool Scoring 0    Opioid Risk Interpretation Low Risk          ORT Scoring interpretation table:  Score <3 = Low Risk for SUD  Score between 4-7 = Moderate Risk for SUD  Score >8 = High Risk for Opioid Abuse   PHQ-2 Depression Scale:  Total score: 0  PHQ-2 Scoring interpretation table: (Score and probability of major depressive disorder)  Score 0 = No depression  Score 1 = 15.4% Probability  Score 2 = 21.1% Probability  Score 3 = 38.4% Probability  Score 4 = 45.5% Probability  Score 5 = 56.4% Probability  Score 6 = 78.6% Probability   PHQ-9 Depression Scale:  Total score: 0  PHQ-9 Scoring interpretation table:  Score 0-4 = No depression  Score 5-9 = Mild depression  Score 10-14 = Moderate  depression  Score 15-19 = Moderately severe depression  Score 20-27 = Severe depression (2.4 times higher risk of SUD and 2.89 times higher risk of overuse)   Pharmacologic Plan: As per protocol, I have not taken over any controlled substance management, pending the results of ordered tests and/or consults.            Initial impression: Pending review of available data and ordered tests.  Meds   Current Outpatient Medications:  .  acetaminophen (TYLENOL) 500 MG tablet, Take 1,000 mg by mouth 2 (two) times daily as needed for moderate pain or headache. , Disp: , Rfl:  .  ALBUTEROL IN, Inhale into the lungs., Disp: , Rfl:  .  atorvastatin (LIPITOR) 40 MG tablet, Take 40 mg by mouth daily., Disp: , Rfl:  .  dicyclomine (BENTYL) 10 MG capsule, Take 10 mg by mouth 3 (three) times daily as needed for spasms. , Disp: , Rfl:  .  doxycycline (DORYX) 100 MG EC tablet, Take 100 mg by mouth 2 (two) times daily., Disp: , Rfl:  .  DULoxetine (CYMBALTA) 20 MG capsule, Take 40 mg by mouth daily., Disp: , Rfl:  .  DULoxetine (CYMBALTA) 60 MG capsule, Take 60 mg by mouth every evening. , Disp: , Rfl:  .  estradiol (ESTRACE) 0.5 MG tablet, Take 1 mg by mouth daily. , Disp: , Rfl:  .  furosemide (LASIX) 20 MG tablet, Take 20 mg by mouth daily as needed., Disp: , Rfl:  .  gabapentin (NEURONTIN) 100 MG capsule, Take by mouth., Disp: , Rfl:  .  Hypromellose (ARTIFICIAL TEARS OP), Place 1-2 drops into both eyes daily as needed (for dry eyes)., Disp: , Rfl:  .  losartan (COZAAR) 50 MG tablet, Take 50 mg by mouth in the morning and at bedtime., Disp: , Rfl:  .  naproxen sodium (ALEVE) 220 MG tablet, Take 220 mg by mouth daily as needed., Disp: , Rfl:  .  omeprazole (PRILOSEC) 20 MG capsule, Take 20 mg by mouth daily., Disp: , Rfl:  .  OXcarbazepine (TRILEPTAL) 150 MG tablet, Take 150 mg by mouth 2 (two) times daily., Disp: , Rfl:  .  temazepam (RESTORIL) 30 MG capsule, Take 30 mg by mouth at bedtime. , Disp: , Rfl:   .  lisinopril (PRINIVIL,ZESTRIL) 10 MG tablet, Take 10 mg by mouth daily., Disp: , Rfl:   Imaging Review  Lumbosacral Imaging: Lumbar MR wo contrast: Results for orders placed during the hospital encounter of 04/17/16 MR LUMBAR SPINE WO CONTRAST  Narrative CLINICAL DATA:  Lumbar radiculopathy. Low back pain with bilateral leg and buttock pain.  Numbness in the left foot and big toe. Lifting injury in August. Prior lumbar surgery.  EXAM: MRI LUMBAR SPINE WITHOUT CONTRAST  TECHNIQUE: Multiplanar, multisequence MR imaging of the lumbar spine was performed. No intravenous contrast was administered.  COMPARISON:  Lumbar spine MRI 08/19/2011 and CT 05/19/2014  FINDINGS: Segmentation:  Standard.  Alignment: Mild lumbar levoscoliosis. Trace anterolisthesis of L3 on L4.  Vertebrae: Chronic L2 compression fracture with slight retropulsion, unchanged from the prior CT. No evidence of new fracture, destructive osseous lesion, or significant marrow edema. Interval L3-L5 posterior fusion and L4-5 interbody fusion.  Conus medullaris: Extends to the T12-L1 level and appears normal.  Paraspinal and other soft tissues: Dorsal epidural fluid collection at the laminectomy sites extending from L3-L5 and measuring 1.5 cm in AP thickness and approximately 5 cm in left-to-right diameter without mass effect on the thecal sac. Partially visualized left renal cyst.  Disc levels:  Disc desiccation throughout the lumbar spine. Mild disc space narrowing at L1-2 with severe narrowing at L3-4.  T12-L1:  Negative.  L1-2: Mild L2 retropulsion and mild disc bulging without significant stenosis, unchanged from the prior CT.  L2-3:  Minimal disc bulging without stenosis, unchanged.  L3-4:  Interval posterior decompression and fusion.  No stenosis.  L4-5:  Interval posterior decompression and fusion.  No stenosis.  L5-S1: Mild disc bulging and shallow left foraminal disc protrusion result in mild  left neural foraminal stenosis, with assessment mildly limited by magnetic susceptibility artifact. The foraminal disc protrusion has decreased in size from the prior CT.  IMPRESSION: 1. Interval L3-L5 fusion without residual stenosis. 2. Mild left neural foraminal stenosis at L5-S1. Disc protrusion has decreased in size since 2015. 3. Chronic L2 compression fracture.   Electronically Signed By: Logan Bores M.D. On: 04/17/2016 16:15  Foot Imaging: Foot-R DG Complete: Results for orders placed during the hospital encounter of 12/17/19 DG Foot Complete Right  Narrative CLINICAL DATA:  [place] today, right foot pain laterally  EXAM: RIGHT FOOT COMPLETE - 3+ VIEW  COMPARISON:  None.  FINDINGS: Frontal, oblique, and lateral views of the right foot are obtained. No fracture, subluxation, or dislocation. Joint spaces are well preserved. Soft tissues are normal. There is a small inferior calcaneal spur.  IMPRESSION: 1. No acute bony abnormality.   Electronically Signed By: Randa Ngo M.D. On: 12/17/2019 21:35  Complexity Note: Imaging results reviewed. Results shared with Ms. Sessums, using Layman's terms.                        ROS  Cardiovascular: heart trouble, high blood pressure, chest pain, heart attack, heart failure Pulmonary or Respiratory: Lung problems, Shortness of breath and Coughing up mucus (Bronchitis) Neurological: Incontinence:  Urinary Psychological-Psychiatric: Prone to panicking Gastrointestinal: Reflux or heatburn and Alternating episodes iof diarrhea and constipation (IBS-Irritable bowe syndrome) Genitourinary: No reported renal or genitourinary signs or symptoms such as difficulty voiding or producing urine, peeing blood, non-functioning kidney, kidney stones, difficulty emptying the bladder, difficulty controlling the flow of urine, or chronic kidney disease Hematological: Brusing easily Endocrine: No reported endocrine signs or symptoms such as  high or low blood sugar, rapid heart rate due to high thyroid levels, obesity or weight gain due to slow thyroid or thyroid disease Rheumatologic: Joint aches and or swelling due to excess weight (Osteoarthritis) and Generalized muscle aches (Fibromyalgia) Musculoskeletal: Negative for myasthenia gravis, muscular dystrophy, multiple sclerosis or malignant hyperthermia Work History: Retired  Allergies  Ms. Cardon is allergic  to spironolactone, hydrochlorothiazide, ciprofloxacin, hydralazine, levofloxacin, albuterol, amlodipine, and latex.  Laboratory Chemistry Profile   Renal Lab Results  Component Value Date   BUN 11 03/13/2020   CREATININE 0.71 03/13/2020   GFRAA >60 04/18/2019   GFRNONAA >60 03/13/2020   PROTEINUR NEGATIVE 08/27/2018     Electrolytes Lab Results  Component Value Date   NA 138 03/13/2020   K 3.0 (L) 03/13/2020   CL 99 03/13/2020   CALCIUM 8.8 (L) 03/13/2020   MG 1.8 06/30/2013     Hepatic Lab Results  Component Value Date   AST 17 08/27/2018   ALT 17 08/27/2018   ALBUMIN 3.8 08/27/2018   ALKPHOS 63 08/27/2018   LIPASE 32 08/27/2018     ID Lab Results  Component Value Date   SARSCOV2NAA Not Detected 05/13/2019   STAPHAUREUS NEGATIVE 12/28/2017   MRSAPCR NEGATIVE 12/28/2017     Bone No results found for: [place], GD924QA8TMH, DQ2229NL8, XQ1194RD4, 25OHVITD1, 25OHVITD2, 25OHVITD3, TESTOFREE, TESTOSTERONE   Endocrine Lab Results  Component Value Date   GLUCOSE 95 03/13/2020   GLUCOSEU NEGATIVE 08/27/2018     Neuropathy No results found for: VITAMINB12, FOLATE, HGBA1C, HIV   CNS No results found for: COLORCSF, APPEARCSF, RBCCOUNTCSF, WBCCSF, POLYSCSF, LYMPHSCSF, EOSCSF, PROTEINCSF, GLUCCSF, JCVIRUS, CSFOLI, IGGCSF, LABACHR, ACETBL, LABACHR, ACETBL   Inflammation (CRP: Acute  ESR: Chronic) Lab Results  Component Value Date   CRP 1.9 (H) 11/30/2017   ESRSEDRATE 35 (H) 11/30/2017   LATICACIDVEN 1.5 02/27/2016     Rheumatology No results  found for: RF, ANA, LABURIC, URICUR, LYMEIGGIGMAB, LYMEABIGMQN, HLAB27   Coagulation Lab Results  Component Value Date   INR 0.97 06/17/2018   LABPROT 12.8 06/17/2018   APTT 29 06/17/2018   PLT 407 (H) 03/13/2020     Cardiovascular Lab Results  Component Value Date   TROPONINI <0.03 06/11/2017   HGB 12.7 03/13/2020   HCT 38.3 03/13/2020     Screening Lab Results  Component Value Date   SARSCOV2NAA Not Detected 05/13/2019   STAPHAUREUS NEGATIVE 12/28/2017   MRSAPCR NEGATIVE 12/28/2017     Cancer No results found for: CEA, CA125, LABCA2   Allergens No results found for: ALMOND, APPLE, ASPARAGUS, AVOCADO, BANANA, BARLEY, BASIL, BAYLEAF, GREENBEAN, LIMABEAN, WHITEBEAN, BEEFIGE, REDBEET, BLUEBERRY, BROCCOLI, CABBAGE, MELON, CARROT, CASEIN, CASHEWNUT, CAULIFLOWER, CELERY     Note: Lab results reviewed.  PFSH  Drug: Ms. Deiss  reports no history of drug use. Alcohol:  reports current alcohol use of about 10.0 standard drinks of alcohol per week. Tobacco:  reports that she quit smoking about 41 years ago. Her smoking use included cigarettes. She has a 20.00 pack-year smoking history. She has never used smokeless tobacco. Medical:  has a past medical history of Allergy, Anxiety, GERD (gastroesophageal reflux disease), Headache, History of kidney stones, Hypertension, Myocardial infarction ([place]), Osteoporosis, and Pneumonia. Family: family history includes Arthritis in her mother; Cancer (age of onset: 14) in her sister; Cancer (age of onset: 22) in her father and mother; Heart disease in her father and mother.  Past Surgical History:  Procedure Laterality Date  . ABDOMINAL HYSTERECTOMY     patient has ovaries  . APPENDECTOMY    . ARTERY BIOPSY Right 12/30/2017   Procedure: BIOPSY TEMPORAL ARTERY;  Surgeon: Katha Cabal, MD;  Location: [facility] ORS;  Service: Vascular;  Laterality: Right;  . ARTERY BIOPSY Left 06/18/2018   Procedure: BIOPSY TEMPORAL ARTERY;  Surgeon: Katha Cabal, MD;  Location: [facility] ORS;  Service: Vascular;  Laterality: Left;  .  BACK SURGERY    . CARDIAC CATHETERIZATION    . CATARACT EXTRACTION W/ INTRAOCULAR LENS  IMPLANT, BILATERAL Bilateral 2010  . CHOLECYSTECTOMY N/A 02/28/2016   Procedure: LAPAROSCOPIC CHOLECYSTECTOMY WITH INTRAOPERATIVE CHOLANGIOGRAM;  Surgeon: Dia Crawford III, MD;  Location: [facility] ORS;  Service: General;  Laterality: N/A;  . LUMBAR FUSION  11/09/2016   L3-L5  . SPINE SURGERY     Disc  . TONSILLECTOMY     Active Ambulatory Problems    Diagnosis Date Noted  . Cholecystitis with cholelithiasis 02/27/2016  . Depression 04/23/2010  . Dupuytren's contracture 07/18/2013  . Fibromyalgia 04/23/2010  . History of night sweats 05/01/2014  . Hypercholesterolemia 04/23/2010  . Essential hypertension 04/23/2010  . Hypothyroidism 04/23/2010  . Inverted nipple 04/18/2013  . Irritable colon 04/23/2010  . Osteoarthritis 04/23/2010  . Increased frequency of urination 01/27/2015  . Head ache 12/03/2017  . Anxiety 06/15/2017  . Atypical chest pain 06/15/2017  . Carcinoma, lung, right ([place]) 06/25/2019  . Chronic low back pain (1ry area of Pain) (Bilateral) (L>R) w/o sciatica 08/17/2017  . Mass of upper lobe of right lung 01/06/2019  . Panic attack as reaction to stress 08/17/2017  . S/P partial lobectomy of lung 02/25/2019  . Sinus bradycardia 10/06/2017  . Hyperlipidemia 06/15/2017  . Chronic nonintractable headache 06/14/2018  . Chronic pain syndrome 05/16/2020  . Pharmacologic therapy 05/16/2020  . Disorder of skeletal system 05/16/2020  . Problems influencing health status 05/16/2020  . 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 Chronic hip pain (2ry area of Pain) (Bilateral) (R>L) 05/16/2020  . Chronic lower extremity pain (Left) 05/16/2020  . Numbness and tingling of both feet (intermittent/recurrent) 05/16/2020  . Pain in rib (Bilateral) (R>L) (intermittent) 05/16/2020  . Chronic knee pain (Left) 05/16/2020  .  Patellar pain (Left) (chronic, intermittent) 05/16/2020  . Hard of hearing 05/16/2020   Resolved Ambulatory Problems    Diagnosis Date Noted  . No Resolved Ambulatory Problems   Past Medical History:  Diagnosis Date  . Allergy   . GERD (gastroesophageal reflux disease)   . Headache   . History of kidney stones   . Hypertension   . Myocardial infarction ([place])   . Osteoporosis   . Pneumonia    Constitutional Exam  General appearance: Well nourished, well developed, and well hydrated. In no apparent acute distress Vitals:   05/16/20 0929  BP: 134/65  Pulse: 70  Resp: 18  Temp: (!) 97.2 F (36.2 C)  SpO2: 98%  Weight: 122 lb (55.3 kg)  Height: '5\' 2"'  (1.575 m)   BMI Assessment: Estimated body mass index is 22.31 kg/m as calculated from the following:   Height as of this encounter: '5\' 2"'  (1.575 m).   Weight as of this encounter: 122 lb (55.3 kg).  BMI interpretation table: BMI level Category Range association with higher incidence of chronic pain  <18 kg/m2 Underweight   18.5-24.9 kg/m2 Ideal body weight   25-29.9 kg/m2 Overweight Increased incidence by 20%  30-34.9 kg/m2 Obese (Class I) Increased incidence by 68%  35-39.9 kg/m2 Severe obesity (Class II) Increased incidence by 136%  >40 kg/m2 Extreme obesity (Class III) Increased incidence by 254%   Patient's current BMI Ideal Body weight  Body mass index is 22.31 kg/m. Ideal body weight: 50.1 kg (110 lb 7.2 oz) Adjusted ideal body weight: 52.2 kg (115 lb 1.1 oz)   BMI Readings from Last 4 Encounters:  05/16/20 22.31 kg/m  03/13/20 22.68 kg/m  12/17/19 22.33 kg/m  08/27/18 22.31 kg/m  Wt Readings from Last 4 Encounters:  05/16/20 122 lb (55.3 kg)  03/13/20 124 lb (56.2 kg)  12/17/19 130 lb 1.1 oz (59 kg)  08/27/18 130 lb (59 kg)    Psych/Mental status: Alert, oriented x 3 (person, place, & time)       Eyes: PERLA Respiratory: No evidence of acute respiratory distress  Assessment  Primary Diagnosis &  Pertinent Problem List: The primary encounter diagnosis was Chronic pain syndrome. Diagnoses of Chronic low back pain (1ry area of Pain) (Bilateral) (L>R) w/o sciatica, Chronic hip pain (2ry area of Pain) (Bilateral) (R>L), Chronic lower extremity pain (Left), Failed back surgical syndrome, Numbness and tingling of both feet (intermittent/recurrent), Pain in rib (Bilateral) (R>L) (intermittent), Chronic knee pain (Left), Patellar pain (Left) (chronic, intermittent), Pharmacologic therapy, Disorder of skeletal system, Problems influencing health status, and Bilateral hearing loss, unspecified hearing loss type were also pertinent to this visit.  Visit Diagnosis (New problems to examiner): 1. Chronic pain syndrome   2. 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 Chronic hip pain (2ry area of Pain) (Bilateral) (R>L)   4. Chronic lower extremity pain (Left)   5. Failed back surgical syndrome   6. Numbness and tingling of both feet (intermittent/recurrent)   7. Pain in rib (Bilateral) (R>L) (intermittent)   8. Chronic knee pain (Left)   9. Patellar pain (Left) (chronic, intermittent)   10. Pharmacologic therapy   11. Disorder of skeletal system   12. Problems influencing health status   13. Bilateral hearing loss, unspecified hearing loss type    Plan of Care (Initial workup plan)  Note: Ms. Carbine was reminded that as per protocol, today's visit has been an evaluation only. We have not taken over the patient's controlled substance management.  Problem-specific plan: No problem-specific Assessment & Plan notes found for this encounter.   Lab Orders     Compliance Drug Analysis, Ur     Comp. Metabolic Panel (12)     Magnesium     Vitamin B12     Sedimentation rate     25-Hydroxy vitamin D Lcms D2+D3     C-reactive protein  Imaging Orders     DG Lumbar Spine Complete W/Bend     DG HIP UNILAT W OR W/O PELVIS 2-3 VIEWS RIGHT     DG HIP UNILAT W OR W/O  PELVIS 2-3 VIEWS LEFT     DG Knee 1-2 Views Left     DG Thoracic Spine 2 View Referral Orders  No referral(s) requested today   Procedure Orders    No procedure(s) ordered today   Pharmacotherapy (current): Medications ordered:  No orders of the defined types were placed in this encounter.  Medications administered during this visit: Giovanni L. Cowley had no medications administered during this visit.   Pharmacological management options:  Opioid Analgesics: The patient was informed that there is no guarantee that she would be a candidate for opioid analgesics. The decision will be made following CDC guidelines. This decision will be based on the results of diagnostic studies, as well as Ms. Ionescu's risk profile.   Membrane stabilizer: To be determined at a later time  Muscle relaxant: To be determined at a later time  NSAID: To be determined at a later time  Other analgesic(s): To be determined at a later time   Interventional management options: Ms. Villacres was informed that there is no guarantee that she would be a candidate for interventional therapies. The  decision will be based on the results of diagnostic studies, as well as Ms. Chovan's risk profile.  Procedure(s) under consideration:  Possible diagnostic midline caudal ESI + diagnostic epidurogram #1  Diagnostic bilateral IA hip joint injection  Diagnostic left IA knee injection    Provider-requested follow-up: Return for (F2F), (2V) post-test eval (40 min), (s/p Tests).  Future Appointments  Date Time Provider [place]  06/04/2020 10:40 AM Milinda Pointer, MD [facility] None    Note by: Gaspar Cola, MD Date: 05/16/2020; Time: 12:50 PM

## 2020-05-16 NOTE — Patient Instructions (Signed)
____________________________________________________________________________________________  General Risks and Possible Complications  Patient Responsibilities: It is important that you read this as it is part of your informed consent. It is our duty to inform you of the risks and possible complications associated with treatments offered to you. It is your responsibility as a patient to read this and to ask questions about anything that is not clear or that you believe was not covered in this document.  Patient's Rights: You have the right to refuse treatment. You also have the right to change your mind, even after initially having agreed to have the treatment done. However, under this last option, if you wait until the last second to change your mind, you may be charged for the materials used up to that point.  Introduction: Medicine is not an exact science. Everything in Medicine, including the lack of treatment(s), carries the potential for danger, harm, or loss (which is by definition: Risk). In Medicine, a complication is a secondary problem, condition, or disease that can aggravate an already existing one. All treatments carry the risk of possible complications. The fact that a side effects or complications occurs, does not imply that the treatment was conducted incorrectly. It must be clearly understood that these can happen even when everything is done following the highest safety standards.  No treatment: You can choose not to proceed with the proposed treatment alternative. The "PRO(s)" would include: avoiding the risk of complications associated with the therapy. The "CON(s)" would include: not getting any of the treatment benefits. These benefits fall under one of three categories: diagnostic; therapeutic; and/or palliative. Diagnostic benefits include: getting information which can ultimately lead to improvement of the disease or symptom(s). Therapeutic benefits are those associated with the  successful treatment of the disease. Finally, palliative benefits are those related to the decrease of the primary symptoms, without necessarily curing the condition (example: decreasing the pain from a flare-up of a chronic condition, such as incurable terminal cancer).  General Risks and Complications: These are associated to most interventional treatments. They can occur alone, or in combination. They fall under one of the following six (6) categories: no benefit or worsening of symptoms; bleeding; infection; nerve damage; allergic reactions; and/or death. 1. No benefits or worsening of symptoms: In Medicine there are no guarantees, only probabilities. No healthcare provider can ever guarantee that a medical treatment will work, they can only state the probability that it may. Furthermore, there is always the possibility that the condition may worsen, either directly, or indirectly, as a consequence of the treatment. 2. Bleeding: This is more common if the patient is taking a blood thinner, either prescription or over the counter (example: Goody Powders, Fish oil, Aspirin, Garlic, etc.), or if suffering a condition associated with impaired coagulation (example: Hemophilia, cirrhosis of the liver, low platelet counts, etc.). However, even if you do not have one on these, it can still happen. If you have any of these conditions, or take one of these drugs, make sure to notify your treating physician. 3. Infection: This is more common in patients with a compromised immune system, either due to disease (example: diabetes, cancer, human immunodeficiency virus [HIV], etc.), or due to medications or treatments (example: therapies used to treat cancer and rheumatological diseases). However, even if you do not have one on these, it can still happen. If you have any of these conditions, or take one of these drugs, make sure to notify your treating physician. 4. Nerve Damage: This is more common when the   treatment is  an invasive one, but it can also happen with the use of medications, such as those used in the treatment of cancer. The damage can occur to small secondary nerves, or to large primary ones, such as those in the spinal cord and brain. This damage may be temporary or permanent and it may lead to impairments that can range from temporary numbness to permanent paralysis and/or brain death. 5. Allergic Reactions: Any time a substance or material comes in contact with our body, there is the possibility of an allergic reaction. These can range from a mild skin rash (contact dermatitis) to a severe systemic reaction (anaphylactic reaction), which can result in death. 6. Death: In general, any medical intervention can result in death, most of the time due to an unforeseen complication. ____________________________________________________________________________________________   

## 2020-05-16 NOTE — Progress Notes (Signed)
Safety precautions to be maintained throughout the outpatient stay will include: orient to surroundings, keep bed in low position, maintain call bell within reach at all times, provide assistance with transfer out of bed and ambulation.  

## 2020-05-23 LAB — COMP. METABOLIC PANEL (12)
AST: 16 IU/L (ref 0–40)
Albumin/Globulin Ratio: 2.1 (ref 1.2–2.2)
Albumin: 4.1 g/dL (ref 3.6–4.6)
Alkaline Phosphatase: 86 IU/L (ref 44–121)
BUN/Creatinine Ratio: 25 (ref 12–28)
BUN: 20 mg/dL (ref 8–27)
Bilirubin Total: 0.4 mg/dL (ref 0.0–1.2)
Calcium: 9.4 mg/dL (ref 8.7–10.3)
Chloride: 93 mmol/L — ABNORMAL LOW (ref 96–106)
Creatinine, Ser: 0.81 mg/dL (ref 0.57–1.00)
GFR calc Af Amer: 79 mL/min/{1.73_m2} (ref >59)
GFR calc non Af Amer: 68 mL/min/{1.73_m2} (ref >59)
Globulin, Total: 2 g/dL (ref 1.5–4.5)
Glucose: 57 mg/dL — ABNORMAL LOW (ref 65–99)
Potassium: 4.6 mmol/L (ref 3.5–5.2)
Sodium: 135 mmol/L (ref 134–144)
Total Protein: 6.1 g/dL (ref 6.0–8.5)

## 2020-05-23 LAB — 25-HYDROXY VITAMIN D LCMS D2+D3
25-Hydroxy, Vitamin D-2: <1 ng/mL
25-Hydroxy, Vitamin D-3: 35 ng/mL
25-Hydroxy, Vitamin D: 35 ng/mL

## 2020-05-23 LAB — COMPLIANCE DRUG ANALYSIS, UR

## 2020-05-23 LAB — SEDIMENTATION RATE: Sed Rate: 2 mm/hr (ref 0–40)

## 2020-05-23 LAB — C-REACTIVE PROTEIN: CRP: 1 mg/L (ref 0–10)

## 2020-05-23 LAB — MAGNESIUM: Magnesium: 1.7 mg/dL (ref 1.6–2.3)

## 2020-05-23 LAB — VITAMIN B12: Vitamin B-12: 330 pg/mL (ref 232–1245)

## 2020-06-03 NOTE — Progress Notes (Deleted)
No show

## 2020-06-04 ENCOUNTER — Ambulatory Visit: Payer: Medicare Other | Admitting: Pain Medicine

## 2020-06-04 DIAGNOSIS — U071 COVID-19: Secondary | ICD-10-CM | POA: Insufficient documentation

## 2020-06-12 ENCOUNTER — Other Ambulatory Visit: Payer: Self-pay | Admitting: Unknown Physician Specialty

## 2020-06-12 ENCOUNTER — Ambulatory Visit
Admission: RE | Admit: 2020-06-12 | Discharge: 2020-06-12 | Disposition: A | Discharge status: Home / Self Care | Payer: Medicare Other | Attending: Unknown Physician Specialty | Admitting: Unknown Physician Specialty

## 2020-06-12 ENCOUNTER — Ambulatory Visit
Admission: RE | Admit: 2020-06-12 | Discharge: 2020-06-12 | Disposition: A | Discharge status: Home / Self Care | Payer: Medicare Other | Source: Ambulatory Visit | Attending: Unknown Physician Specialty | Admitting: Unknown Physician Specialty

## 2020-06-12 DIAGNOSIS — R0602 Shortness of breath: Secondary | ICD-10-CM | POA: Insufficient documentation

## 2020-06-12 DIAGNOSIS — R051 Acute cough: Secondary | ICD-10-CM | POA: Diagnosis present

## 2020-06-17 NOTE — Progress Notes (Unsigned)
PROVIDER NOTE: Information contained herein reflects review and annotations entered in association with encounter. Interpretation of such information and data should be left to medically-trained personnel. Information provided to patient can be located elsewhere in the medical record under "Patient Instructions". Document created using STT-dictation technology, any transcriptional errors that may result from process are unintentional.    Patient: Kendra Benton  Service Category: E/M  Provider: Gaspar Cola, MD  DOB: 1940-09-28  DOS: 06/18/2020  Specialty: Interventional Pain Management  MRN: [id number]  Setting: Ambulatory outpatient  PCP: Jacklynn Barnacle, MD  Type: Established Patient    Referring Provider: Jacklynn Barnacle  Location: Office  Delivery: Face-to-face     Primary Reason(s) for Visit: Encounter for evaluation before starting new chronic pain management plan of care (Level of risk: moderate) CC: Back Pain  HPI  Kendra Benton is a 80 y.o. year old, female patient, who comes today for a follow-up evaluation to review the test results and decide on a treatment plan. She has Cholecystitis with cholelithiasis; Depression; Dupuytren's contracture; Fibromyalgia; History of night sweats; Hypercholesterolemia; Essential hypertension; Hypothyroidism; Inverted nipple; Irritable colon; Osteoarthritis; Increased frequency of urination; Head ache; Anxiety; Atypical chest pain; Carcinoma, lung, right ([place]); Chronic low back pain (1ry area of Pain) (Bilateral) (L>R) w/o sciatica; Mass of upper lobe of right lung; Panic attack as reaction to stress; S/P partial lobectomy of lung; Sinus bradycardia; Hyperlipidemia; Chronic nonintractable headache; Chronic pain syndrome; Pharmacologic therapy; Disorder of skeletal system; Problems influencing health status; Failed back surgical syndrome; Chronic hip pain (2ry area of Pain) (Bilateral) (R>L); Chronic lower extremity pain (Left); Numbness and  tingling of both feet (intermittent/recurrent); Pain in rib (Bilateral) (R>L) (intermittent); Chronic knee pain (Left); Patellar pain (Left) (chronic, intermittent); Hard of hearing; COVID; and Lumbar facet joint syndrome on their problem list. Her primarily concern today is the Back Pain  Pain Assessment: Location: Right,Left Back Radiating: pain radiaties down to both hips Onset: More than a month ago Duration: Chronic pain Quality: Burning,Aching Severity: 7 /10 (subjective, self-reported pain score)  Effect on ADL: limits my daily activities Timing: Intermittent Modifying factors: heating pad and meds BP: (!) 150/71  HR: 75  Kendra Benton comes in today for a follow-up visit after her initial evaluation on 06/04/2020, on the same date she was apparently found to be positive for COVID 19 at a [facility] healthcare facility.  Today we went over the results of her tests. These were explained in "Layman's terms". During today's appointment we went over my diagnostic impression, as well as the proposed treatment plan.  This is a known patient of mine. The last time I saw her was around 12/20/2013 when I did a bilateral lumbar spinal cord stimulator implant for her pain. After that, she did not come back and apparently she went to Dr. Zadie Rhine at the [facility] where she had her stimulator removed. She then underwent a back surgery by Dr. Edrick Oh and later a second back surgery for fusion, approximately 6 years ago at the [facility] by Dr. Zadie Rhine. She was then referred to the [facility] pain clinic by her physician. At that pain clinic she received some injections which she indicates that never worked as well as the ones that we had done for her.  Because her last visit was in 2015, and our electronic medical record was changed after that, I do not have access today to those old notes. However, we had the patient sign a release form to get the notes from the  [place] pain clinic as well as the  operative report from the [facility]. I have also instructed my staff to search the old electronic medical record for her notes so that I can review them.  The patient indicates her primary pain to be that of the lower back, bilaterally(L>R). She has undergone 2 back surgeries in addition to the bilateral spinal cord stimulator implant that we had done. She does not have a recent x-ray of her lumbar spine.  The patient's secondary area pain is that of the hips, bilaterally(R>L)she denies any hip surgery, and she does indicate having had an injection at the [facility] pain clinic but apparently it was of the SI joint. She refers that the benefits from that lasted only 1 to 2 days. She was told that her pain is secondary to scar tissue around her fusion rods.  The patient's third area pain is that of the left lower extremity. This pain is described to be intermittent. She also describes having bilateral greater toe numbness which is also intermittent. The pain in the left leg goes down through the posterior and then lateral aspect of the leg into the area just anterior to the knee joint and the anterolateral, distal aspect of the left patella.  The patient's fourth area pain is that of tenderness around the lower rib cage, bilaterally,(R>L). She describes that that pain started when she fell and bruised her rib cage approximately 4 years ago. This is not a constant pain, but over time it tends to come and go, but primarily it feels tender to palpation. She indicates having had some x-rays that were negative for any type of fractures.  Finally, the patient's last area (fifth) of pain is that of the left knee. She describes the pain as being across her patella and also being intermittent. She denies any surgeries, joint injections, or any recent x-rays.  Addendum:The surgeons name is Dr. Fatima Sanger Buttram.This surgery was done on 11/10/2014 and the preoperative diagnosis was lumbar  radiculopathy and degenerative scoliosis. The operative report indicates that the procedure was a minimally invasive direct lateral interbody fusion L4-5 with PEEK interbody cage, bone morphogenic protein and graftonDBF. 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 During that particular appointment they documented that the patient has a history of an L3-L5 fusion. They also documented that she had a fall on December 2018. And their evaluation they indicated the patient had an MRI of the lumbar spine without contrast done on 04/17/2016 which indicated that she had that interval L3-5 fusion without any residual stenosis. She also had mild left neuroforaminal stenosis at L5-S1. The disc protrusion at this level seem to have decreased in size since 2015. She also had evidence of a chronic L2 compression fracture.  Today I went over the results of her recent laboratory and x-ray testing.  The patient was provided with copies of her x-ray reports.  Physical exam today was positive for reproduction of her low back pain upon hyperextension and rotation maneuver and Kemp maneuver bilaterally.  Both were positive for ipsilateral lumbar facet arthralgia.  The patient's lumbar x-rays do confirm that she has posterior element degenerative joint disease.  In considering the treatment plan options, Ms. Annunziato was reminded that I no longer take patients for medication management only. I  asked her to let me know if she had no intention of taking advantage of the interventional therapies, so that we could make arrangements to provide this space to someone interested. I also made it clear that undergoing interventional therapies for the purpose of getting pain medications is very inappropriate on the part of a  patient, and it will not be tolerated in this practice. This type of behavior would suggest true addiction and therefore it requires referral to an addiction specialist.   Further details on both, my assessment(s), as well as the proposed treatment plan, please see below.  Controlled Substance Pharmacotherapy Assessment REMS (Risk Evaluation and Mitigation Strategy)  Analgesic: None MME/day: 0 mg/day  Pill Count: None expected due to no prior prescriptions written by our practice. Chauncey Fischer, RN  06/18/2020  9:19 AM  Sign when Signing Visit Safety precautions to be maintained throughout the outpatient stay will include: orient to surroundings, keep bed in low position, maintain call bell within reach at all times, provide assistance with transfer out of bed and ambulation.    Pharmacokinetics: Liberation and absorption (onset of action): WNL Distribution (time to peak effect): WNL Metabolism and excretion (duration of action): WNL         Pharmacodynamics: Desired effects: Analgesia: Ms. Faraci reports >50% benefit. Functional ability: Patient reports that medication allows her to accomplish basic ADLs Clinically meaningful improvement in function (CMIF): Sustained CMIF goals met Perceived effectiveness: Described as relatively effective, allowing for increase in activities of daily living (ADL) Undesirable effects: Side-effects or Adverse reactions: None reported Monitoring: [place] PMP: PDMP reviewed during this encounter. Online review of the past 83-monthperiod previously conducted. Not applicable at this point since we have not taken over the patient's medication management yet. List of other Serum/Urine Drug Screening Test(s):  No results found for: AMPHSCRSER, BARBSCRSER, BENZOSCRSER, COCAINSCRSER, COCAINSCRNUR, PCPSCRSER, THCSCRSER, THCU, CANNABQUANT, [place] EJerseytownList of all UDS test(s) done:  Lab Results  Component Value Date   SUMMARY Note  05/16/2020   Last UDS on record: Summary  Date Value Ref Range Status  05/16/2020 Note  Final    Comment:    ==================================================================== Compliance Drug Analysis, Ur ==================================================================== Test                             Result       Flag       Units  Drug Present and Declared for Prescription Verification   Oxazepam                       3036         EXPECTED   ng/mg creat   Temazepam                      >9091        EXPECTED   ng/mg creat    Oxazepam and temazepam are expected metabolites of diazepam.    Oxazepam is also an expected metabolite of other benzodiazepine    drugs, including chlordiazepoxide, prazepam, clorazepate, halazepam,    and temazepam.  Oxazepam and temazepam are available as scheduled    prescription medications.    Gabapentin                     PRESENT      EXPECTED   Oxcarbazepine MHD  PRESENT      EXPECTED    Oxcarbazepine MHD is the active metabolite of oxcarbazepine and    eslicarbazepine.    Duloxetine                     PRESENT      EXPECTED   Acetaminophen                  PRESENT      EXPECTED   Naproxen                       PRESENT      EXPECTED ==================================================================== Test                      Result    Flag   Units      Ref Range   Creatinine              22               mg/dL      >=20 ==================================================================== Declared Medications:  The flagging and interpretation on this report are based on the  following declared medications.  Unexpected results may arise from  inaccuracies in the declared medications.   **Note: The testing scope of this panel includes these medications:   Duloxetine (Cymbalta)  Gabapentin (Neurontin)  Naproxen (Aleve)  Oxcarbazepine (Trileptal)  Temazepam (Restoril)   **Note: The testing scope of this panel does not include  small to  moderate amounts of these reported medications:   Acetaminophen (Tylenol)   **Note: The testing scope of this panel does not include the  following reported medications:   Albuterol  Atorvastatin (Lipitor)  Dicyclomine (Bentyl)  Doxycycline (Doryx)  Estradiol (Estrace)  Eye Drops  Furosemide (Lasix)  Lisinopril (Zestril)  Losartan (Cozaar)  Omeprazole (Prilosec) ==================================================================== For clinical consultation, please call [phone]. ====================================================================    UDS interpretation: No unexpected findings.          Medication Assessment Form: Patient introduced to form today Treatment compliance: Treatment may start today if patient agrees with proposed plan. Evaluation of compliance is not applicable at this point Risk Assessment Profile: Aberrant behavior: See initial evaluations. None observed or detected today Comorbid factors increasing risk of overdose: See initial evaluation. No additional risks detected today Opioid risk tool (ORT):  Opioid Risk  05/16/2020  Alcohol 0  Illegal Drugs 0  Rx Drugs 0  Alcohol 0  Illegal Drugs 0  Rx Drugs 0  Age between 16-45 years  0  History of Preadolescent Sexual Abuse 0  Psychological Disease 0  Depression 0  Opioid Risk Tool Scoring 0  Opioid Risk Interpretation Low Risk    ORT Scoring interpretation table:  Score <3 = Low Risk for SUD  Score between 4-7 = Moderate Risk for SUD  Score >8 = High Risk for Opioid Abuse   Risk of substance use disorder (SUD): Low  Risk Mitigation Strategies:  Patient opioid safety counseling: Completed today. Counseling provided to patient as per "Patient Counseling Document". Document signed by patient, attesting to counseling and understanding Patient-Prescriber Agreement (PPA): Obtained today.  Controlled substance notification to other providers: Written and sent today.  Pharmacologic  Plan: Today we may be taking over the patient's pharmacological regimen. See below.             Laboratory Chemistry Profile   Renal Lab Results  Component Value Date   BUN 20 05/16/2020  CREATININE 0.81 05/16/2020   BCR 25 05/16/2020   GFRAA 79 05/16/2020   GFRNONAA 68 05/16/2020   PROTEINUR NEGATIVE 08/27/2018     Electrolytes Lab Results  Component Value Date   NA 135 05/16/2020   K 4.6 05/16/2020   CL 93 (L) 05/16/2020   CALCIUM 9.4 05/16/2020   MG 1.7 05/16/2020     Hepatic Lab Results  Component Value Date   AST 16 05/16/2020   ALT 17 08/27/2018   ALBUMIN 4.1 05/16/2020   ALKPHOS 86 05/16/2020   LIPASE 32 08/27/2018     ID Lab Results  Component Value Date   SARSCOV2NAA Not Detected 05/13/2019   STAPHAUREUS NEGATIVE 12/28/2017   MRSAPCR NEGATIVE 12/28/2017     Bone Lab Results  Component Value Date   25OHVITD1 35 05/16/2020   25OHVITD2 <1.0 05/16/2020   25OHVITD3 35 05/16/2020     Endocrine Lab Results  Component Value Date   GLUCOSE 57 (L) 05/16/2020   GLUCOSEU NEGATIVE 08/27/2018     Neuropathy Lab Results  Component Value Date   VITAMINB12 330 05/16/2020     CNS No results found for: COLORCSF, APPEARCSF, RBCCOUNTCSF, WBCCSF, POLYSCSF, LYMPHSCSF, EOSCSF, PROTEINCSF, GLUCCSF, JCVIRUS, CSFOLI, IGGCSF, LABACHR, ACETBL, LABACHR, ACETBL   Inflammation (CRP: Acute  ESR: Chronic) Lab Results  Component Value Date   CRP 1 05/16/2020   ESRSEDRATE 2 05/16/2020   LATICACIDVEN 1.5 02/27/2016     Rheumatology No results found for: RF, ANA, LABURIC, URICUR, LYMEIGGIGMAB, LYMEABIGMQN, HLAB27   Coagulation Lab Results  Component Value Date   INR 0.97 06/17/2018   LABPROT 12.8 06/17/2018   APTT 29 06/17/2018   PLT 407 (H) 03/13/2020     Cardiovascular Lab Results  Component Value Date   TROPONINI <0.03 06/11/2017   HGB 12.7 03/13/2020   HCT 38.3 03/13/2020     Screening Lab Results  Component Value Date   SARSCOV2NAA Not Detected  05/13/2019   STAPHAUREUS NEGATIVE 12/28/2017   MRSAPCR NEGATIVE 12/28/2017     Cancer No results found for: CEA, CA125, LABCA2   Allergens No results found for: ALMOND, APPLE, ASPARAGUS, AVOCADO, BANANA, BARLEY, BASIL, BAYLEAF, GREENBEAN, LIMABEAN, WHITEBEAN, BEEFIGE, REDBEET, BLUEBERRY, BROCCOLI, CABBAGE, MELON, CARROT, CASEIN, CASHEWNUT, CAULIFLOWER, CELERY     Note: Lab results reviewed.  Recent Diagnostic Imaging Review  Thoracic Imaging: Thoracic DG 2-3 views: Results for orders placed during the hospital encounter of 05/16/20 DG Thoracic Spine 2 View  Narrative CLINICAL DATA:  Thoracic back pain.  EXAM: THORACIC SPINE 2 VIEWS  COMPARISON:  MRI lumbar spine 04/17/2016.  Thoracic CT 05/19/2014  FINDINGS: Mild dextroscoliosis midthoracic spine. Negative for thoracic fracture or mass.  Chronic compression fracture L2 unchanged from 2017 lumbar MRI. Lumbar spinal fusion hardware noted.  IMPRESSION: Negative for thoracic fracture.  Mild scoliosis.  Electronically Signed By: Franchot Gallo M.D. On: 05/17/2020 14:23  Lumbosacral Imaging: Lumbar MR wo contrast: Results for orders placed during the hospital encounter of 04/17/16 MR LUMBAR SPINE WO CONTRAST  Narrative CLINICAL DATA:  Lumbar radiculopathy. Low back pain with bilateral leg and buttock pain. Numbness in the left foot and big toe. Lifting injury in August. Prior lumbar surgery.  EXAM: MRI LUMBAR SPINE WITHOUT CONTRAST  TECHNIQUE: Multiplanar, multisequence MR imaging of the lumbar spine was performed. No intravenous contrast was administered.  COMPARISON:  Lumbar spine MRI 08/19/2011 and CT 05/19/2014  FINDINGS: Segmentation:  Standard.  Alignment: Mild lumbar levoscoliosis. Trace anterolisthesis of L3 on L4.  Vertebrae: Chronic L2 compression fracture with slight  retropulsion, unchanged from the prior CT. No evidence of new fracture, destructive osseous lesion, or significant marrow edema.  Interval L3-L5 posterior fusion and L4-5 interbody fusion.  Conus medullaris: Extends to the T12-L1 level and appears normal.  Paraspinal and other soft tissues: Dorsal epidural fluid collection at the laminectomy sites extending from L3-L5 and measuring 1.5 cm in AP thickness and approximately 5 cm in left-to-right diameter without mass effect on the thecal sac. Partially visualized left renal cyst.  Disc levels:  Disc desiccation throughout the lumbar spine. Mild disc space narrowing at L1-2 with severe narrowing at L3-4.  T12-L1:  Negative.  L1-2: Mild L2 retropulsion and mild disc bulging without significant stenosis, unchanged from the prior CT.  L2-3:  Minimal disc bulging without stenosis, unchanged.  L3-4:  Interval posterior decompression and fusion.  No stenosis.  L4-5:  Interval posterior decompression and fusion.  No stenosis.  L5-S1: Mild disc bulging and shallow left foraminal disc protrusion result in mild left neural foraminal stenosis, with assessment mildly limited by magnetic susceptibility artifact. The foraminal disc protrusion has decreased in size from the prior CT.  IMPRESSION: 1. Interval L3-L5 fusion without residual stenosis. 2. Mild left neural foraminal stenosis at L5-S1. Disc protrusion has decreased in size since 2015. 3. Chronic L2 compression fracture.   Electronically Signed By: Logan Bores M.D. On: 04/17/2016 16:15  Lumbar DG Bending views: Results for orders placed during the hospital encounter of 05/16/20 DG Lumbar Spine Complete W/Bend  Narrative CLINICAL DATA:  Low back pain  EXAM: LUMBAR SPINE - COMPLETE WITH BENDING VIEWS  COMPARISON:  Lumbar MRI 04/17/2016  FINDINGS: Posterior lumbar fusion from L3-L5 with pedicle screws and fusion rods. Interbody spacer at L4-L5. Chronic endplate compression at L2.  No significant subluxation with flexion and extension maneuvers.  IMPRESSION: Posterior lumbar fusion from I3-B0  without complication.  No subluxation with flexion and extension maneuvers.   Electronically Signed By: Suzy Bouchard M.D. On: 05/17/2020 14:20        Hip Imaging: Hip-R DG 2-3 views: Results for orders placed during the hospital encounter of 05/16/20 DG HIP UNILAT W OR W/O PELVIS 2-3 VIEWS RIGHT  Narrative CLINICAL DATA:  Right hip pain  EXAM: DG HIP (WITH OR WITHOUT PELVIS) 2-3V RIGHT  COMPARISON:  None.  FINDINGS: No hip fracture or bone lesion.  Hip joint space normal.  No pelvic bony lesion. Lumbar fusion with pedicle screw and interbody fusion and posterior bony fusion.  IMPRESSION: Negative right hip   Electronically Signed By: Franchot Gallo M.D. On: 05/17/2020 14:18  Hip-L DG 2-3 views: Results for orders placed during the hospital encounter of 05/16/20 DG HIP UNILAT W OR W/O PELVIS 2-3 VIEWS LEFT  Narrative CLINICAL DATA:  Right hip pain.  Fall July 2021.  EXAM: DG HIP (WITH OR WITHOUT PELVIS) 2-3V LEFT  COMPARISON:  None.  FINDINGS: There is no evidence of hip fracture or dislocation. There is no evidence of arthropathy or other focal bone abnormality.  IMPRESSION: Negative.   Electronically Signed By: Franchot Gallo M.D. On: 05/17/2020 14:19  Knee Imaging: Knee-L DG 1-2 views: Results for orders placed during the hospital encounter of 05/16/20 DG Knee 1-2 Views Left  Narrative CLINICAL DATA:  Knee pain.  Recent falls  EXAM: LEFT KNEE - 1-2 VIEW  COMPARISON:  None.  FINDINGS: Normal alignment no fracture. No joint effusion. Joint spaces maintained  Small soft tissue calcifications anterior knee above and below the patella appear benign. Possible phleboliths.  IMPRESSION: Negative.   Electronically Signed By: Franchot Gallo M.D. On: 05/17/2020 14:20  Foot Imaging: Foot-R DG Complete: Results for orders placed during the hospital encounter of 12/17/19 DG Foot Complete Right  Narrative CLINICAL DATA:  [place] today,  right foot pain laterally  EXAM: RIGHT FOOT COMPLETE - 3+ VIEW  COMPARISON:  None.  FINDINGS: Frontal, oblique, and lateral views of the right foot are obtained. No fracture, subluxation, or dislocation. Joint spaces are well preserved. Soft tissues are normal. There is a small inferior calcaneal spur.  IMPRESSION: 1. No acute bony abnormality.   Electronically Signed By: Randa Ngo M.D. On: 12/17/2019 21:35  Complexity Note: Imaging results reviewed. Results shared with Ms. Hemler, using Layman's terms.                        Meds   Current Outpatient Medications:  .  acetaminophen (TYLENOL) 500 MG tablet, Take 1,000 mg by mouth 2 (two) times daily as needed for moderate pain or headache. , Disp: , Rfl:  .  ALBUTEROL IN, Inhale into the lungs., Disp: , Rfl:  .  atorvastatin (LIPITOR) 40 MG tablet, Take 40 mg by mouth daily., Disp: , Rfl:  .  calcium-vitamin D (OSCAL WITH D) 500-200 MG-UNIT tablet, Take 1 tablet by mouth., Disp: , Rfl:  .  dicyclomine (BENTYL) 10 MG capsule, Take 10 mg by mouth 3 (three) times daily as needed for spasms. , Disp: , Rfl:  .  DULoxetine (CYMBALTA) 20 MG capsule, Take 40 mg by mouth daily., Disp: , Rfl:  .  DULoxetine (CYMBALTA) 60 MG capsule, Take 60 mg by mouth every evening. , Disp: , Rfl:  .  estradiol (ESTRACE) 0.5 MG tablet, Take 1 mg by mouth daily. , Disp: , Rfl:  .  furosemide (LASIX) 20 MG tablet, Take 20 mg by mouth daily as needed., Disp: , Rfl:  .  gabapentin (NEURONTIN) 100 MG capsule, Take by mouth., Disp: , Rfl:  .  Hypromellose (ARTIFICIAL TEARS OP), Place 1-2 drops into both eyes daily as needed (for dry eyes)., Disp: , Rfl:  .  loperamide (IMODIUM) 2 MG capsule, Take by mouth as needed for diarrhea or loose stools., Disp: , Rfl:  .  losartan (COZAAR) 50 MG tablet, Take 50 mg by mouth in the morning and at bedtime., Disp: , Rfl:  .  naproxen sodium (ALEVE) 220 MG tablet, Take 220 mg by mouth daily as needed., Disp: , Rfl:  .   omeprazole (PRILOSEC) 20 MG capsule, Take 20 mg by mouth daily., Disp: , Rfl:  .  OXcarbazepine (TRILEPTAL) 150 MG tablet, Take 150 mg by mouth 2 (two) times daily., Disp: , Rfl:  .  polycarbophil (FIBERCON) 625 MG tablet, Take 625 mg by mouth daily., Disp: , Rfl:  .  potassium chloride SA (KLOR-CON) 20 MEQ tablet, Take 20 mEq by mouth 2 (two) times daily., Disp: , Rfl:  .  temazepam (RESTORIL) 30 MG capsule, Take 30 mg by mouth at bedtime., Disp: , Rfl:  .  traMADol (ULTRAM) 50 MG tablet, Take 1 tablet (50 mg total) by mouth 2 (two) times daily as needed for severe pain. Must last 30 days, Disp: 60 tablet, Rfl: 0 .  TURMERIC PO, Take 900 mg by mouth daily., Disp: , Rfl:  .  lisinopril (PRINIVIL,ZESTRIL) 10 MG tablet, Take 10 mg by mouth daily., Disp: , Rfl:   ROS  Constitutional: Denies any fever or chills Gastrointestinal: No reported hemesis,  hematochezia, vomiting, or acute GI distress Musculoskeletal: Denies any acute onset joint swelling, redness, loss of ROM, or weakness Neurological: No reported episodes of acute onset apraxia, aphasia, dysarthria, agnosia, amnesia, paralysis, loss of coordination, or loss of consciousness  Allergies  Ms. Ullery is allergic to spironolactone, hydrochlorothiazide, ciprofloxacin, hydralazine, levofloxacin, albuterol, amlodipine, and latex.  PFSH  Drug: Ms. Start  reports no history of drug use. Alcohol:  reports current alcohol use of about 10.0 standard drinks of alcohol per week. Tobacco:  reports that she quit smoking about 42 years ago. Her smoking use included cigarettes. She has a 20.00 pack-year smoking history. She has never used smokeless tobacco. Medical:  has a past medical history of Allergy, Anxiety, GERD (gastroesophageal reflux disease), Headache, History of kidney stones, Hypertension, Myocardial infarction ([place]), Osteoporosis, and Pneumonia. Surgical: Ms. Kashani  has a past surgical history that includes Cholecystectomy (N/A,  02/28/2016); Spine surgery; Abdominal hysterectomy; Appendectomy; Lumbar fusion (11/09/2016); Cardiac catheterization; Back surgery; Tonsillectomy; Cataract extraction w/ intraocular lens  implant, bilateral (Bilateral, 2010); Artery Biopsy (Right, 12/30/2017); and Artery Biopsy (Left, 06/18/2018). Family: family history includes Arthritis in her mother; Cancer (age of onset: 6) in her sister; Cancer (age of onset: 29) in her father and mother; Heart disease in her father and mother.  Constitutional Exam  General appearance: Well nourished, well developed, and well hydrated. In no apparent acute distress Vitals:   06/18/20 0907  BP: (!) 150/71  Pulse: 75  Temp: (!) 97.2 F (36.2 C)  SpO2: 97%  Weight: 123 lb (55.8 kg)  Height: _0  (1.575 m)   BMI Assessment: Estimated body mass index is 22.5 kg/m as calculated from the following:   Height as of this encounter: _1  (1.575 m).   Weight as of this encounter: 123 lb (55.8 kg).  BMI interpretation table: BMI level Category Range association with higher incidence of chronic pain  <18 kg/m2 Underweight   18.5-24.9 kg/m2 Ideal body weight   25-29.9 kg/m2 Overweight Increased incidence by 20%  30-34.9 kg/m2 Obese (Class I) Increased incidence by 68%  35-39.9 kg/m2 Severe obesity (Class II) Increased incidence by 136%  >40 kg/m2 Extreme obesity (Class III) Increased incidence by 254%   Patient's current BMI Ideal Body weight  Body mass index is 22.5 kg/m. Ideal body weight: 50.1 kg (110 lb 7.2 oz) Adjusted ideal body weight: 52.4 kg (115 lb 7.5 oz)   BMI Readings from Last 4 Encounters:  06/18/20 22.50 kg/m  05/16/20 22.31 kg/m  03/13/20 22.68 kg/m  12/17/19 22.33 kg/m   Wt Readings from Last 4 Encounters:  06/18/20 123 lb (55.8 kg)  05/16/20 122 lb (55.3 kg)  03/13/20 124 lb (56.2 kg)  12/17/19 130 lb 1.1 oz (59 kg)    Psych/Mental status: Alert, oriented x 3 (person, place, & time)       Eyes: PERLA Respiratory: No  evidence of acute respiratory distress  Assessment & Plan  Primary Diagnosis & Pertinent Problem List: The primary encounter diagnosis was Chronic pain syndrome. 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  Visit Diagnosis: 1. Chronic pain syndrome   2. 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 Lumbar facet joint syndrome   4. Pharmacologic therapy    Problems updated and reviewed during this visit: Problem  Lumbar Facet Joint Syndrome  Covid    Plan of  Care  Pharmacotherapy (Medications Ordered): Meds ordered this encounter  Medications  . traMADol (ULTRAM) 50 MG tablet    Sig: Take 1 tablet (50 mg total) by mouth 2 (two) times daily as needed for severe pain. Must last 30 days    Dispense:  60 tablet    Refill:  0    Chronic Pain: STOP Act (Not applicable) Fill 1 day early if closed on refill date. Avoid benzodiazepines within 8 hours of opioids    Procedure Orders     LUMBAR FACET(MEDIAL BRANCH NERVE BLOCK) MBNB Lab Orders  No laboratory test(s) ordered today   Imaging Orders  No imaging studies ordered today   Referral Orders  No referral(s) requested today    Pharmacological management options:  Opioid Analgesics: We'll take over management today. See above orders Membrane stabilizer: Options discussed, including a trial. Muscle relaxant: We have discussed the possibility of a trial NSAID: Trial discussed. Other analgesic(s): To be determined at a later time    Interventional management options: Planned, scheduled, and/or pending:    Diagnostic bilateral lumbar facet block #1    Considering:   Possible diagnostic midline caudal ESI + diagnostic epidurogram #1  Diagnostic bilateral IA hip joint injection  Diagnostic left IA knee injection    PRN Procedures:   None at this time     Provider-requested follow-up: Return for Procedure (w/ sedation): (B) L-FCT BLK #1. Recent Visits Date Type Provider Dept  05/16/20 Office Visit Milinda Pointer, MD [facility]  Showing recent visits within past 90 days and meeting all other requirements Today's Visits Date Type Provider Dept  06/18/20 Office Visit Milinda Pointer, MD [facility]  Showing today's visits and meeting all other requirements Future Appointments No visits were found meeting these conditions. Showing future appointments within next 90 days and meeting all other requirements  Primary Care Physician: Jacklynn Barnacle, MD Note by: Gaspar Cola, MD Date: 06/18/2020; Time: 9:50 AM

## 2020-06-18 ENCOUNTER — Encounter: Payer: Self-pay | Admitting: Pain Medicine

## 2020-06-18 ENCOUNTER — Other Ambulatory Visit: Payer: Self-pay

## 2020-06-18 ENCOUNTER — Ambulatory Visit: Payer: Medicare Other | Admitting: Pain Medicine

## 2020-06-18 ENCOUNTER — Ambulatory Visit: Payer: Medicare Other | Attending: Pain Medicine | Admitting: Pain Medicine

## 2020-06-18 VITALS — BP 150/71 | HR 75 | Temp 97.2°F | Ht 62.0 in | Wt 123.0 lb

## 2020-06-18 DIAGNOSIS — G8929 Other chronic pain: Secondary | ICD-10-CM

## 2020-06-18 DIAGNOSIS — G894 Chronic pain syndrome: Secondary | ICD-10-CM | POA: Diagnosis present

## 2020-06-18 DIAGNOSIS — M545 Low back pain, unspecified: Secondary | ICD-10-CM | POA: Insufficient documentation

## 2020-06-18 DIAGNOSIS — M5137 Other intervertebral disc degeneration, lumbosacral region: Secondary | ICD-10-CM | POA: Insufficient documentation

## 2020-06-18 DIAGNOSIS — M47816 Spondylosis without myelopathy or radiculopathy, lumbar region: Secondary | ICD-10-CM | POA: Diagnosis present

## 2020-06-18 DIAGNOSIS — Z79899 Other long term (current) drug therapy: Secondary | ICD-10-CM | POA: Diagnosis present

## 2020-06-18 DIAGNOSIS — M47817 Spondylosis without myelopathy or radiculopathy, lumbosacral region: Secondary | ICD-10-CM | POA: Insufficient documentation

## 2020-06-18 MED ORDER — TRAMADOL HCL 50 MG PO TABS: 50.0000 mg | ORAL_TABLET | Freq: Two times a day (BID) | ORAL | 0 refills | Status: DC | PRN

## 2020-06-18 NOTE — Progress Notes (Signed)
Safety precautions to be maintained throughout the outpatient stay will include: orient to surroundings, keep bed in low position, maintain call bell within reach at all times, provide assistance with transfer out of bed and ambulation.  

## 2020-06-18 NOTE — Patient Instructions (Incomplete)

## 2020-06-18 NOTE — Patient Instructions (Addendum)
____________________________________________________________________________________________  Pain Prevention Technique  Definition:   A technique used to minimize the effects of an activity known to cause inflammation or swelling, which in turn leads to an increase in pain.  Purpose: To prevent swelling from occurring. It is based on the fact that it is easier to prevent swelling from happening than it is to get rid of it, once it occurs.  Contraindications: 1. Anyone with allergy or hypersensitivity to the recommended medications. 2. Anyone taking anticoagulants (Blood Thinners) (e.g., Coumadin, Warfarin, Plavix, etc.). 3. Patients in Renal Failure.  Technique: Before you undertake an activity known to cause pain, or a flare-up of your chronic pain, and before you experience any pain, do the following:  1. On a full stomach, take 4 (four) over the counter Ibuprofens 200mg  tablets (Motrin), for a total of 800 mg. 2. In addition, take over the counter Magnesium 400 to 500 mg, before doing the activity.  3. Six (6) hours later, again on a full stomach, repeat the Ibuprofen. 4. That night, take a warm shower and stretch under the running warm water.  This technique may be sufficient to abort the pain and discomfort before it happens. Keep in mind that it takes a lot less medication to prevent swelling than it takes to eliminate it once it occurs.  ____________________________________________________________________________________________   ____________________________________________________________________________________________  Preparing for Procedure with Sedation  Procedure appointments are limited to planned procedures: . No Prescription Refills. . No disability issues will be discussed. . No medication changes will be discussed.  Instructions: . Oral Intake: Do not eat or drink anything for at least 8 hours prior to your procedure. (Exception: Blood Pressure Medication.  See below.) . Transportation: Unless otherwise stated by your physician, you may drive yourself after the procedure. . Blood Pressure Medicine: Do not forget to take your blood pressure medicine with a sip of water the morning of the procedure. If your Diastolic (lower reading)is above 100 mmHg, elective cases will be cancelled/rescheduled. . Blood thinners: These will need to be stopped for procedures. Notify our staff if you are taking any blood thinners. Depending on which one you take, there will be specific instructions on how and when to stop it. . Diabetics on insulin: Notify the staff so that you can be scheduled 1st case in the morning. If your diabetes requires high dose insulin, take only  of your normal insulin dose the morning of the procedure and notify the staff that you have done so. . Preventing infections: Shower with an antibacterial soap the morning of your procedure. . Build-up your immune system: Take 1000 mg of Vitamin C with every meal (3 times a day) the day prior to your procedure. Marland Kitchen Antibiotics: Inform the staff if you have a condition or reason that requires you to take antibiotics before dental procedures. . Pregnancy: If you are pregnant, call and cancel the procedure. . Sickness: If you have a cold, fever, or any active infections, call and cancel the procedure. . Arrival: You must be in the facility at least 30 minutes prior to your scheduled procedure. . Children: Do not bring children with you. . Dress appropriately: Bring dark clothing that you would not mind if they get stained. . Valuables: Do not bring any jewelry or valuables.  Reasons to call and reschedule or cancel your procedure: (Following these recommendations will minimize the risk of a serious complication.) . Surgeries: Avoid having procedures within 2 weeks of any surgery. (Avoid for 2 weeks before or after  any surgery). . Flu Shots: Avoid having procedures within 2 weeks of a flu shots or . (Avoid  for 2 weeks before or after immunizations). . Barium: Avoid having a procedure within 7-10 days after having had a radiological study involving the use of radiological contrast. (Myelograms, Barium swallow or enema study). . Heart attacks: Avoid any elective procedures or surgeries for the initial 6 months after a "Myocardial Infarction" (Heart Attack). . Blood thinners: It is imperative that you stop these medications before procedures. Let us know if you if you take any blood thinner.  . Infection: Avoid procedures during or within two weeks of an infection (including chest colds or gastrointestinal problems). Symptoms associated with infections include: Localized redness, fever, chills, night sweats or profuse sweating, burning sensation when voiding, cough, congestion, stuffiness, runny nose, sore throat, diarrhea, nausea, vomiting, cold or Flu symptoms, recent or current infections. It is specially important if the infection is over the area that we intend to treat. Marland Kitchen Heart and lung problems: Symptoms that may suggest an active cardiopulmonary problem include: cough, chest pain, breathing difficulties or shortness of breath, dizziness, ankle swelling, uncontrolled high or unusually low blood pressure, and/or palpitations. If you are experiencing any of these symptoms, cancel your procedure and contact your primary care physician for an evaluation.  Remember:  Regular Business hours are:  Monday to Thursday 8:00 AM to 4:00 PM  Provider's Schedule: Milinda Pointer, MD:  Procedure days: Tuesday and Thursday 7:30 AM to 4:00 PM  Gillis Santa, MD:  Procedure days: Monday and Wednesday 7:30 AM to 4:00 PM ____________________________________________________________________________________________   ____________________________________________________________________________________________  Medication Rules  Purpose: To inform patients, and their family members, of our rules and  regulations.  Applies to: All patients receiving prescriptions (written or electronic).  Pharmacy of record: Pharmacy where electronic prescriptions will be sent. If written prescriptions are taken to a different pharmacy, please inform the nursing staff. The pharmacy listed in the electronic medical record should be the one where you would like electronic prescriptions to be sent.  Electronic prescriptions: In compliance with the [place] (STOP) Act of 2017 (Session Lanny Cramp [phone]), effective June 09, 2018, all controlled substances must be electronically prescribed. Calling prescriptions to the pharmacy will cease to exist.  Prescription refills: Only during scheduled appointments. Applies to all prescriptions.  NOTE: The following applies primarily to controlled substances (Opioid* Pain Medications).   Type of encounter (visit): For patients receiving controlled substances, face-to-face visits are required. (Not an option or up to the patient.)  Patient's responsibilities: 1. Pain Pills: Bring all pain pills to every appointment (except for procedure appointments). 2. Pill Bottles: Bring pills in original pharmacy bottle. Always bring the newest bottle. Bring bottle, even if empty. 3. Medication refills: You are responsible for knowing and keeping track of what medications you take and those you need refilled. The day before your appointment: write a list of all prescriptions that need to be refilled. The day of the appointment: give the list to the admitting nurse. Prescriptions will be written only during appointments. No prescriptions will be written on procedure days. If you forget a medication: it will not be "Called in", "Faxed", or "electronically sent". You will need to get another appointment to get these prescribed. No early refills. Do not call asking to have your prescription filled early. 4. Prescription Accuracy: You are responsible  for carefully inspecting your prescriptions before leaving our office. Have the discharge nurse carefully go over each prescription with  you, before taking them home. Make sure that your name is accurately spelled, that your address is correct. Check the name and dose of your medication to make sure it is accurate. Check the number of pills, and the written instructions to make sure they are clear and accurate. Make sure that you are given enough medication to last until your next medication refill appointment. 5. Taking Medication: Take medication as prescribed. When it comes to controlled substances, taking less pills or less frequently than prescribed is permitted and encouraged. Never take more pills than instructed. Never take medication more frequently than prescribed.  6. Inform other Doctors: Always inform, all of your healthcare providers, of all the medications you take. 7. Pain Medication from other Providers: You are not allowed to accept any additional pain medication from any other Doctor or Healthcare provider. There are two exceptions to this rule. (see below) In the event that you require additional pain medication, you are responsible for notifying us, as stated below. 8. Cough Medicine: Often these contain an opioid, such as codeine or hydrocodone. Never accept or take cough medicine containing these opioids if you are already taking an opioid* medication. The combination may cause respiratory failure and death. 9. Medication Agreement: You are responsible for carefully reading and following our Medication Agreement. This must be signed before receiving any prescriptions from our practice. Safely store a copy of your signed Agreement. Violations to the Agreement will result in no further prescriptions. (Additional copies of our Medication Agreement are available upon request.) 10. Laws, Rules, & Regulations: All patients are expected to follow all Federal and [employer], Rules, [employer] Ignorance of the Laws does not constitute a valid excuse.  11. Illegal drugs and Controlled Substances: The use of illegal substances (including, but not limited to marijuana and its derivatives) and/or the illegal use of any controlled substances is strictly prohibited. Violation of this rule may result in the immediate and permanent discontinuation of any and all prescriptions being written by our practice. The use of any illegal substances is prohibited. 12. Adopted CDC guidelines & recommendations: Target dosing levels will be at or below 60 MME/day. Use of benzodiazepines** is not recommended.  Exceptions: There are only two exceptions to the rule of not receiving pain medications from other Healthcare Providers. 1. Exception #1 (Emergencies): In the event of an emergency (i.e.: accident requiring emergency care), you are allowed to receive additional pain medication. However, you are responsible for: As soon as you are able, call our office [phone], at any time of the day or night, and leave a message stating your name, the date and nature of the emergency, and the name and dose of the medication prescribed. In the event that your call is answered by a member of our staff, make sure to document and save the date, time, and the name of the person that took your information.  2. Exception #2 (Planned Surgery): In the event that you are scheduled by another doctor or dentist to have any type of surgery or procedure, you are allowed (for a period no longer than 30 days), to receive additional pain medication, for the acute post-op pain. However, in this case, you are responsible for picking up a copy of our "Post-op Pain Management for Surgeons" handout, and giving it to your surgeon or dentist. This document is available at our office, and does not require an appointment to obtain it. Simply go to our office during business hours (Monday-Thursday from  8:00 AM to 4:00 PM) (Friday 8:00 AM  to 12:00 Noon) or if you have a scheduled appointment with [place], prior to your surgery, and ask for it by name. In addition, you are responsible for: calling our office [phone], at any time of the day or night, and leaving a message stating your name, name of your surgeon, type of surgery, and date of procedure or surgery. Failure to comply with your responsibilities may result in termination of therapy involving the controlled substances.  *Opioid medications include: morphine, codeine, oxycodone, oxymorphone, hydrocodone, hydromorphone, meperidine, tramadol, tapentadol, buprenorphine, fentanyl, methadone. **Benzodiazepine medications include: diazepam (Valium), alprazolam (Xanax), clonazepam (Klonopine), lorazepam (Ativan), clorazepate (Tranxene), chlordiazepoxide (Librium), estazolam (Prosom), oxazepam (Serax), temazepam (Restoril), triazolam (Halcion) (Last updated: 05/07/2020) ____________________________________________________________________________________________   ____________________________________________________________________________________________  Medication Recommendations and Reminders  Applies to: All patients receiving prescriptions (written and/or electronic).  Medication Rules & Regulations: These rules and regulations exist for your safety and that of others. They are not flexible and neither are we. Dismissing or ignoring them will be considered "non-compliance" with medication therapy, resulting in complete and irreversible termination of such therapy. (See document titled "Medication Rules" for more details.) In all conscience, because of safety reasons, we cannot continue providing a therapy where the patient does not follow instructions.  Pharmacy of record:   Definition: This is the pharmacy where your electronic prescriptions will be sent.   We do not endorse any particular pharmacy, however, we have experienced problems with [facility] not securing enough  medication supply for the community.  We do not restrict you in your choice of pharmacy. However, once we write for your prescriptions, we will NOT be re-sending more prescriptions to fix restricted supply problems created by your pharmacy, or your insurance.   The pharmacy listed in the electronic medical record should be the one where you want electronic prescriptions to be sent.  If you choose to change pharmacy, simply notify our nursing staff.  Recommendations:  Keep all of your pain medications in a safe place, under lock and key, even if you live alone. We will NOT replace lost, stolen, or damaged medication.  After you fill your prescription, take 1 week's worth of pills and put them away in a safe place. You should keep a separate, properly labeled bottle for this purpose. The remainder should be kept in the original bottle. Use this as your primary supply, until it runs out. Once it's gone, then you know that you have 1 week's worth of medicine, and it is time to come in for a prescription refill. If you do this correctly, it is unlikely that you will ever run out of medicine.  To make sure that the above recommendation works, it is very important that you make sure your medication refill appointments are scheduled at least 1 week before you run out of medicine. To do this in an effective manner, make sure that you do not leave the office without scheduling your next medication management appointment. Always ask the nursing staff to show you in your prescription , when your medication will be running out. Then arrange for the receptionist to get you a return appointment, at least 7 days before you run out of medicine. Do not wait until you have 1 or 2 pills left, to come in. This is very poor planning and does not take into consideration that we may need to cancel appointments due to bad weather, sickness, or emergencies affecting our staff.  DO NOT ACCEPT A "Partial  Fill": If for any  reason your pharmacy does not have enough pills/tablets to completely fill or refill your prescription, do not allow for a "partial fill". The law allows the pharmacy to complete that prescription within 72 hours, without requiring a new prescription. If they do not fill the rest of your prescription within those 72 hours, you will need a separate prescription to fill the remaining amount, which we will NOT provide. If the reason for the partial fill is your insurance, you will need to talk to the pharmacist about payment alternatives for the remaining tablets, but again, DO NOT ACCEPT A PARTIAL FILL, unless you can trust your pharmacist to obtain the remainder of the pills within 72 hours.  Prescription refills and/or changes in medication(s):   Prescription refills, and/or changes in dose or medication, will be conducted only during scheduled medication management appointments. (Applies to both, written and electronic prescriptions.)  No refills on procedure days. No medication will be changed or started on procedure days. No changes, adjustments, and/or refills will be conducted on a procedure day. Doing so will interfere with the diagnostic portion of the procedure.  No phone refills. No medications will be "called into the pharmacy".  No Fax refills.  No weekend refills.  No Holliday refills.  No after hours refills.  Remember:  Business hours are:  Monday to Thursday 8:00 AM to 4:00 PM Provider's Schedule: Milinda Pointer, MD - Appointments are:  Medication management: Monday and Wednesday 8:00 AM to 4:00 PM Procedure day: Tuesday and Thursday 7:30 AM to 4:00 PM Gillis Santa, MD - Appointments are:  Medication management: Tuesday and Thursday 8:00 AM to 4:00 PM Procedure day: Monday and Wednesday 7:30 AM to 4:00 PM (Last update:  12/28/2019) ____________________________________________________________________________________________   ____________________________________________________________________________________________  CBD (cannabidiol) WARNING  Applicable to: All individuals currently taking or considering taking CBD (cannabidiol) and, more important, all patients taking opioid analgesic controlled substances (pain medication). (Example: oxycodone; oxymorphone; hydrocodone; hydromorphone; morphine; methadone; tramadol; tapentadol; fentanyl; buprenorphine; butorphanol; dextromethorphan; meperidine; codeine; etc.)  Legal status: CBD remains a Schedule I drug prohibited for any use. CBD is illegal with one exception. In the [place], CBD has a limited Transport planner (FDA) approval for the treatment of two specific types of epilepsy disorders. Only one CBD product has been approved by the FDA for this purpose: "Epidiolex". FDA is aware that some companies are marketing products containing cannabis and cannabis-derived compounds in ways that violate the [employer], Drug and Cosmetic Act [facility] Act) and that may put the health and safety of consumers at risk. The FDA, a Federal agency, has not enforced the CBD status since 2018.   Legality: Some manufacturers ship CBD products nationally, which is illegal. Often such products are sold online and are therefore available throughout the country. CBD is openly sold in head shops and health food stores in some states where such sales have not been explicitly legalized. Selling unapproved products with unsubstantiated therapeutic claims is not only a violation of the law, but also can put patients at risk, as these products have not been proven to be safe or effective. Federal illegality makes it difficult to conduct research on CBD.  Reference: "FDA Regulation of Cannabis and Cannabis-Derived Products, Including Cannabidiol (CBD)" -  [employer]  Warning: CBD is not FDA approved and has not undergo the same manufacturing controls as prescription drugs.  This means that the purity and safety of available CBD may be questionable. Most of the time, despite manufacturer's  claims, it is contaminated with THC (delta-9-tetrahydrocannabinol - the chemical in marijuana responsible for the "HIGH").  When this is the case, the [place] contaminant will trigger a positive urine drug screen (UDS) test for Marijuana (carboxy-THC). Because a positive UDS for any illicit substance is a violation of our medication agreement, your opioid analgesics (pain medicine) may be permanently discontinued.  MORE ABOUT CBD  General Information: CBD  is a derivative of the Marijuana (cannabis sativa) plant discovered in 31. It is one of the 113 identified substances found in Marijuana. It accounts for up to 40% of the plant's extract. As of 2018, preliminary clinical studies on CBD included research for the treatment of anxiety, movement disorders, and pain. CBD is available and consumed in multiple forms, including inhalation of smoke or vapor, as an aerosol spray, and by mouth. It may be supplied as an oil containing CBD, capsules, dried cannabis, or as a liquid solution. CBD is thought not to be as psychoactive as THC (delta-9-tetrahydrocannabinol - the chemical in marijuana responsible for the "HIGH"). Studies suggest that CBD may interact with different biological target receptors in the body, including cannabinoid and other neurotransmitter receptors. As of 2018 the mechanism of action for its biological effects has not been determined.  Side-effects  Adverse reactions: Dry mouth, diarrhea, decreased appetite, fatigue, drowsiness, malaise, weakness, sleep disturbances, and others.  Drug interactions: CBC may interact with other medications  such as blood-thinners. (Last update: 01/14/2020) ____________________________________________________________________________________________   ____________________________________________________________________________________________  Drug Holidays (Slow)  What is a "Drug Holiday"? Drug Holiday: is the name given to the period of time during which a patient stops taking a medication(s) for the purpose of eliminating tolerance to the drug.  Benefits . Improved effectiveness of opioids. . Decreased opioid dose needed to achieve benefits. . Improved pain with lesser dose.  What is tolerance? Tolerance: is the progressive decreased in effectiveness of a drug due to its repetitive use. With repetitive use, the body gets use to the medication and as a consequence, it loses its effectiveness. This is a common problem seen with opioid pain medications. As a result, a larger dose of the drug is needed to achieve the same effect that used to be obtained with a smaller dose.  How long should a "Drug Holiday" last? You should stay off of the pain medicine for at least 14 consecutive days. (2 weeks)  Should I stop the medicine "cold [place]"? No. You should always coordinate with your Pain Specialist so that he/she can provide you with the correct medication dose to make the transition as smoothly as possible.  How do I stop the medicine? Slowly. You will be instructed to decrease the daily amount of pills that you take by one (1) pill every seven (7) days. This is called a "slow downward taper" of your dose. For example: if you normally take four (4) pills per day, you will be asked to drop this dose to three (3) pills per day for seven (7) days, then to two (2) pills per day for seven (7) days, then to one (1) per day for seven (7) days, and at the end of those last seven (7) days, this is when the "Drug Holiday" would start.   Will I have withdrawals? By doing a "slow downward taper" like this  one, it is unlikely that you will experience any significant withdrawal symptoms. Typically, what triggers withdrawals is the sudden stop of a high dose opioid therapy. Withdrawals can usually be avoided by slowly  decreasing the dose over a prolonged period of time. If you do not follow these instructions and decide to stop your medication abruptly, withdrawals may be possible.  What are withdrawals? Withdrawals: refers to the wide range of symptoms that occur after stopping or dramatically reducing opiate drugs after heavy and prolonged use. Withdrawal symptoms do not occur to patients that use low dose opioids, or those who take the medication sporadically. Contrary to benzodiazepine (example: Valium, Xanax, etc.) or alcohol withdrawals ("Delirium Tremens"), opioid withdrawals are not lethal. Withdrawals are the physical manifestation of the body getting rid of the excess receptors.  Expected Symptoms Early symptoms of withdrawal may include: . Agitation . Anxiety . Muscle aches . Increased tearing . Insomnia . Runny nose . Sweating . Yawning  Late symptoms of withdrawal may include: . Abdominal cramping . Diarrhea . Dilated pupils . Goose bumps . Nausea . Vomiting  Will I experience withdrawals? Due to the slow nature of the taper, it is very unlikely that you will experience any.  What is a slow taper? Taper: refers to the gradual decrease in dose.  (Last update: 12/28/2019) ____________________________________________________________________________________________

## 2020-06-18 NOTE — Progress Notes (Addendum)
PROVIDER NOTE: Information contained herein reflects review and annotations entered in association with encounter. Interpretation of such information and data should be left to medically-trained personnel. Information provided to patient can be located elsewhere in the medical record under "Patient Instructions". Document created using STT-dictation technology, any transcriptional errors that may result from process are unintentional.    Patient: Kendra Benton  Service Category: Procedure  Provider: Gaspar Cola, MD  DOB: 1940-07-06  DOS: 06/19/2020  Location: [facility]  MRN: [id number]  Setting: Ambulatory - outpatient  Referring Provider: Jacklynn Barnacle  Type: Established Patient  Specialty: Interventional Pain Management  PCP: Jacklynn Barnacle, MD   Primary Reason for Visit: Interventional Pain Management Treatment. CC: Back Pain (Lower back/)  Procedure:          Anesthesia, Analgesia, Anxiolysis:  Type: Lumbar Facet, Medial Branch Block(s) #1  Primary Purpose: Diagnostic Region: Posterolateral Lumbosacral Spine Level: L2, L3, L4, L5, & S1 Medial Branch Level(s). Injecting these levels blocks the L3-4, L4-5, and L5-S1 lumbar facet joints. Laterality: Bilateral  Type: Moderate (Conscious) Sedation combined with Local Anesthesia Indication(s): Analgesia and Anxiety Route: Intravenous (IV) IV Access: Secured Sedation: Meaningful verbal contact was maintained at all times during the procedure  Local Anesthetic: Lidocaine 1-2%  Position: Prone   Indications: 1. Lumbar facet joint syndrome   2. Spondylosis without myelopathy or radiculopathy, lumbosacral region   3. DDD (degenerative disc disease), lumbosacral   4. Chronic low back pain (1ry area of Pain) (Bilateral) (L>R) w/o sciatica    Pain Score: Pre-procedure: 6 /10 Post-procedure: 0-No pain/10   Pre-op H&P Assessment:  Kendra Benton is a 80 y.o. (year old), female patient, seen today for  interventional treatment. She  has a past surgical history that includes Cholecystectomy (N/A, 02/28/2016); Spine surgery; Abdominal hysterectomy; Appendectomy; Lumbar fusion (11/09/2016); Cardiac catheterization; Back surgery; Tonsillectomy; Cataract extraction w/ intraocular lens  implant, bilateral (Bilateral, 2010); Artery Biopsy (Right, 12/30/2017); and Artery Biopsy (Left, 06/18/2018). Kendra Benton has a current medication list which includes the following prescription(s): acetaminophen, albuterol, atorvastatin, calcium-vitamin d, dicyclomine, duloxetine, duloxetine, estradiol, furosemide, gabapentin, carboxymethylcellulose sodium, loperamide, losartan, naproxen sodium, omeprazole, oxcarbazepine, fibercon, potassium chloride sa, temazepam, tramadol, turmeric, and lisinopril, and the following Facility-Administered Medications: fentanyl and midazolam. Her primarily concern today is the Back Pain (Lower back/)  Initial Vital Signs:  Pulse/HCG Rate: 73ECG Heart Rate: 75 Temp: (!) 97.2 F (36.2 C) Resp: 16 BP: (!) 137/54 SpO2: 96 %  BMI: Estimated body mass index is 22.5 kg/m as calculated from the following:   Height as of this encounter: 5\' 2"  (1.575 m).   Weight as of this encounter: 123 lb (55.8 kg).  Risk Assessment: Allergies: Reviewed. She is allergic to spironolactone, hydrochlorothiazide, ciprofloxacin, hydralazine, levofloxacin, albuterol, amlodipine, and latex.  Allergy Precautions: None required Coagulopathies: Reviewed. None identified.  Blood-thinner therapy: None at this time Active Infection(s): Reviewed. None identified. Kendra Benton is afebrile  Site Confirmation: Kendra Benton was asked to confirm the procedure and laterality before marking the site Procedure checklist: Completed Consent: Before the procedure and under the influence of no sedative(s), amnesic(s), or anxiolytics, the patient was informed of the treatment options, risks and possible complications. To fulfill our  ethical and legal obligations, as recommended by the American Medical Association's Code of Ethics, I have informed the patient of my clinical impression; the nature and purpose of the treatment or procedure; the risks, benefits, and possible complications of the intervention; the alternatives, including doing nothing; the risk(s) and benefit(s)  of the alternative treatment(s) or procedure(s); and the risk(s) and benefit(s) of doing nothing. The patient was provided information about the general risks and possible complications associated with the procedure. These may include, but are not limited to: failure to achieve desired goals, infection, bleeding, organ or nerve damage, allergic reactions, paralysis, and death. In addition, the patient was informed of those risks and complications associated to Spine-related procedures, such as failure to decrease pain; infection (i.e.: Meningitis, epidural or intraspinal abscess); bleeding (i.e.: epidural hematoma, subarachnoid hemorrhage, or any other type of intraspinal or peri-dural bleeding); organ or nerve damage (i.e.: Any type of peripheral nerve, nerve root, or spinal cord injury) with subsequent damage to sensory, motor, and/or autonomic systems, resulting in permanent pain, numbness, and/or weakness of one or several areas of the body; allergic reactions; (i.e.: anaphylactic reaction); and/or death. Furthermore, the patient was informed of those risks and complications associated with the medications. These include, but are not limited to: allergic reactions (i.e.: anaphylactic or anaphylactoid reaction(s)); adrenal axis suppression; blood sugar elevation that in diabetics may result in ketoacidosis or comma; water retention that in patients with history of congestive heart failure may result in shortness of breath, pulmonary edema, and decompensation with resultant heart failure; weight gain; swelling or edema; medication-induced neural toxicity; particulate  matter embolism and blood vessel occlusion with resultant organ, and/or nervous system infarction; and/or aseptic necrosis of one or more joints. Finally, the patient was informed that Medicine is not an exact science; therefore, there is also the possibility of unforeseen or unpredictable risks and/or possible complications that may result in a catastrophic outcome. The patient indicated having understood very clearly. We have given the patient no guarantees and we have made no promises. Enough time was given to the patient to ask questions, all of which were answered to the patient's satisfaction. Ms. Bach has indicated that she wanted to continue with the procedure. Attestation: I, the ordering provider, attest that I have discussed with the patient the benefits, risks, side-effects, alternatives, likelihood of achieving goals, and potential problems during recovery for the procedure that I have provided informed consent. Date  Time: 06/19/2020  8:28 AM  Pre-Procedure Preparation:  Monitoring: As per clinic protocol. Respiration, ETCO2, SpO2, BP, heart rate and rhythm monitor placed and checked for adequate function Safety Precautions: Patient was assessed for positional comfort and pressure points before starting the procedure. Time-out: I initiated and conducted the "Time-out" before starting the procedure, as per protocol. The patient was asked to participate by confirming the accuracy of the "Time Out" information. Verification of the correct person, site, and procedure were performed and confirmed by me, the nursing staff, and the patient. "Time-out" conducted as per Joint Commission's Universal Protocol (UP.01.01.01). Time: 0919  Description of Procedure:          Laterality: Bilateral. The procedure was performed in identical fashion on both sides. Levels:  L2, L3, L4, L5, & S1 Medial Branch Level(s) Area Prepped: Posterior Lumbosacral Region DuraPrep (Iodine Povacrylex [0.7% available  iodine] and Isopropyl Alcohol, 74% w/w) Safety Precautions: Aspiration looking for blood return was conducted prior to all injections. At no point did we inject any substances, as a needle was being advanced. Before injecting, the patient was told to immediately notify me if she was experiencing any new onset of "ringing in the ears, or metallic taste in the mouth". No attempts were made at seeking any paresthesias. Safe injection practices and needle disposal techniques used. Medications properly checked for expiration dates. SDV (  single dose vial) medications used. After the completion of the procedure, all disposable equipment used was discarded in the proper designated medical waste containers. Local Anesthesia: Protocol guidelines were followed. The patient was positioned over the fluoroscopy table. The area was prepped in the usual manner. The time-out was completed. The target area was identified using fluoroscopy. A 12-in long, straight, sterile hemostat was used with fluoroscopic guidance to locate the targets for each level blocked. Once located, the skin was marked with an approved surgical skin marker. Once all sites were marked, the skin (epidermis, dermis, and hypodermis), as well as deeper tissues (fat, connective tissue and muscle) were infiltrated with a small amount of a short-acting local anesthetic, loaded on a 10cc syringe with a 25G, 1.5-in  Needle. An appropriate amount of time was allowed for local anesthetics to take effect before proceeding to the next step. Local Anesthetic: Lidocaine 2.0% The unused portion of the local anesthetic was discarded in the proper designated containers. Technical explanation of process:  L2 Medial Branch Nerve Block (MBB): The target area for the L2 medial branch is at the junction of the postero-lateral aspect of the superior articular process and the superior, posterior, and medial edge of the transverse process of L3. Under fluoroscopic guidance, a  Quincke needle was inserted until contact was made with os over the superior postero-lateral aspect of the pedicular shadow (target area). After negative aspiration for blood, 0.5 mL of the nerve block solution was injected without difficulty or complication. The needle was removed intact. L3 Medial Branch Nerve Block (MBB): The target area for the L3 medial branch is at the junction of the postero-lateral aspect of the superior articular process and the superior, posterior, and medial edge of the transverse process of L4. Under fluoroscopic guidance, a Quincke needle was inserted until contact was made with os over the superior postero-lateral aspect of the pedicular shadow (target area). After negative aspiration for blood, 0.5 mL of the nerve block solution was injected without difficulty or complication. The needle was removed intact. L4 Medial Branch Nerve Block (MBB): The target area for the L4 medial branch is at the junction of the postero-lateral aspect of the superior articular process and the superior, posterior, and medial edge of the transverse process of L5. Under fluoroscopic guidance, a Quincke needle was inserted until contact was made with os over the superior postero-lateral aspect of the pedicular shadow (target area). After negative aspiration for blood, 0.5 mL of the nerve block solution was injected without difficulty or complication. The needle was removed intact. L5 Medial Branch Nerve Block (MBB): The target area for the L5 medial branch is at the junction of the postero-lateral aspect of the superior articular process and the superior, posterior, and medial edge of the sacral ala. Under fluoroscopic guidance, a Quincke needle was inserted until contact was made with os over the superior postero-lateral aspect of the pedicular shadow (target area). After negative aspiration for blood, 0.5 mL of the nerve block solution was injected without difficulty or complication. The needle was  removed intact. S1 Medial Branch Nerve Block (MBB): The target area for the S1 medial branch is at the posterior and inferior 6 o'clock position of the L5-S1 facet joint. Under fluoroscopic guidance, the Quincke needle inserted for the L5 MBB was redirected until contact was made with os over the inferior and postero aspect of the sacrum, at the 6 o' clock position under the L5-S1 facet joint (Target area). After negative aspiration for blood, 0.5  mL of the nerve block solution was injected without difficulty or complication. The needle was removed intact.  Nerve block solution: 0.2% PF-Ropivacaine + Triamcinolone (40 mg/mL) diluted to a final concentration of 4 mg of Triamcinolone/mL of Ropivacaine The unused portion of the solution was discarded in the proper designated containers. Procedural Needles: 22-gauge, 3.5-inch, Quincke needles used for all levels.  Once the entire procedure was completed, the treated area was cleaned, making sure to leave some of the prepping solution back to take advantage of its long term bactericidal properties.   Illustration of the posterior view of the lumbar spine and the posterior neural structures. Laminae of L2 through S1 are labeled. DPRL5, dorsal primary ramus of L5; DPRS1, dorsal primary ramus of S1; DPR3, dorsal primary ramus of L3; FJ, facet (zygapophyseal) joint L3-L4; I, inferior articular process of L4; LB1, lateral branch of dorsal primary ramus of L1; IAB, inferior articular branches from L3 medial branch (supplies L4-L5 facet joint); IBP, intermediate branch plexus; MB3, medial branch of dorsal primary ramus of L3; NR3, third lumbar nerve root; S, superior articular process of L5; SAB, superior articular branches from L4 (supplies L4-5 facet joint also); TP3, transverse process of L3.  Vitals:   06/19/20 0930 06/19/20 0940 06/19/20 0950 06/19/20 1000  BP: (!) 151/64 (!) 157/67 (!) 144/61 140/60  Pulse:      Resp: 16 16 18 18   Temp:  (!) 97.2 F (36.2  C)  (!) 97.3 F (36.3 C)  TempSrc:      SpO2: 100% 96% 96% 95%  Weight:      Height:         Start Time: 0919 hrs. End Time: 0930 hrs.  Imaging Guidance (Spinal):          Type of Imaging Technique: Fluoroscopy Guidance (Spinal) Indication(s): Assistance in needle guidance and placement for procedures requiring needle placement in or near specific anatomical locations not easily accessible without such assistance. Exposure Time: Please see nurses notes. Contrast: None used. Fluoroscopic Guidance: I was personally present during the use of fluoroscopy. "Tunnel Vision Technique" used to obtain the best possible view of the target area. Parallax error corrected before commencing the procedure. "Direction-depth-direction" technique used to introduce the needle under continuous pulsed fluoroscopy. Once target was reached, antero-posterior, oblique, and lateral fluoroscopic projection used confirm needle placement in all planes. Images permanently stored in EMR. Interpretation: No contrast injected. I personally interpreted the imaging intraoperatively. Adequate needle placement confirmed in multiple planes. Permanent images saved into the patient's record.  Antibiotic Prophylaxis:   Anti-infectives (From admission, onward)   None     Indication(s): None identified  Post-operative Assessment:  Post-procedure Vital Signs:  Pulse/HCG Rate: 7372 Temp: (!) 97.3 F (36.3 C) Resp: 18 BP: 140/60 SpO2: 95 %  EBL: None  Complications: No immediate post-treatment complications observed by team, or reported by patient.  Note: The patient tolerated the entire procedure well. A repeat set of vitals were taken after the procedure and the patient was kept under observation following institutional policy, for this type of procedure. Post-procedural neurological assessment was performed, showing return to baseline, prior to discharge. The patient was provided with post-procedure discharge  instructions, including a section on how to identify potential problems. Should any problems arise concerning this procedure, the patient was given instructions to immediately contact us, at any time, without hesitation. In any case, we plan to contact the patient by telephone for a follow-up status report regarding this interventional procedure.  Comments:  No  additional relevant information.  Plan of Care  Orders:  Orders Placed This Encounter  Procedures  . LUMBAR FACET(MEDIAL BRANCH NERVE BLOCK) MBNB    Scheduling Instructions:     Procedure: Lumbar facet block (AKA.: Lumbosacral medial branch nerve block)     Side: Bilateral     Level: L3-4, L4-5, & L5-S1 Facets (L2, L3, L4, L5, & S1 Medial Branch Nerves)     Sedation: Patient's choice.     Timeframe: Today    Order Specific Question:   Where will this procedure be performed?    Answer:   [facility]  . [facility] C-ARM 1-60 MIN NO REPORT    Intraoperative interpretation by procedural physician at [facility].    Standing Status:   Standing    Number of Occurrences:   1    Order Specific Question:   Reason for exam:    Answer:   Assistance in needle guidance and placement for procedures requiring needle placement in or near specific anatomical locations not easily accessible without such assistance.  . Informed Consent Details: Physician/Practitioner Attestation; Transcribe to consent form and obtain patient signature    Nursing Order: Transcribe to consent form and obtain patient signature. Note: Always confirm laterality of pain with Ms. Glaeser, before procedure.    Order Specific Question:   Physician/Practitioner attestation of informed consent for procedure/surgical case    Answer:   I, the physician/practitioner, attest that I have discussed with the patient the benefits, risks, side effects, alternatives, likelihood of achieving goals and potential problems during recovery for the procedure that I have  provided informed consent.    Order Specific Question:   Procedure    Answer:   Lumbar Facet Block  under fluoroscopic guidance    Order Specific Question:   Physician/Practitioner performing the procedure    Answer:   Anner Baity A. Dossie Arbour MD    Order Specific Question:   Indication/Reason    Answer:   Low Back Pain, with our without leg pain, due to Facet Joint Arthralgia (Joint Pain) Spondylosis (Arthritis of the Spine), without myelopathy or radiculopathy (Nerve Damage).  . Provide equipment / supplies at bedside    "Block Tray" (Disposable  single use) Needle type: SpinalSpinal Amount/quantity: 4 Size: Regular (3.5-inch) Gauge: 22G    Standing Status:   Standing    Number of Occurrences:   1    Order Specific Question:   Specify    Answer:   Block Tray   Chronic Opioid Analgesic:  None MME/day: 0 mg/day   Medications ordered for procedure: Meds ordered this encounter  Medications  . lidocaine (XYLOCAINE) 2 % (with pres) injection 400 mg  . lactated ringers infusion 1,000 mL  . midazolam (VERSED) 5 MG/5ML injection 1-2 mg    Make sure Flumazenil is available in the pyxis when using this medication. If oversedation occurs, administer 0.2 mg IV over 15 sec. If after 45 sec no response, administer 0.2 mg again over 1 min; may repeat at 1 min intervals; not to exceed 4 doses (1 mg)  . fentaNYL (SUBLIMAZE) injection 25-50 mcg    Make sure Narcan is available in the pyxis when using this medication. In the event of respiratory depression (RR< 8/min): Titrate NARCAN (naloxone) in increments of 0.1 to 0.2 mg IV at 2-3 minute intervals, until desired degree of reversal.  . ropivacaine (PF) 2 mg/mL (0.2%) (NAROPIN) injection 18 mL  . triamcinolone acetonide (KENALOG-40) injection 80 mg   Medications administered: We  administered lidocaine, lactated ringers, midazolam, fentaNYL, ropivacaine (PF) 2 mg/mL (0.2%), and triamcinolone acetonide.  See the medical record for exact dosing, route,  and time of administration.  Follow-up plan:   Return in about 2 weeks (around 07/03/2020) for (F2F), (PP) Follow-up.      Interventional Therapies  Risk  Complexity Considerations:   Advanced age  Latex allergy  For candidate for Lumbar RF secondary to hardware     Planned  Pending:      Under consideration:   Possible diagnostic midline caudal ESI + diagnostic epidurogram #1  Diagnostic bilateral IA hip joint injection  Diagnostic left IA knee injection    Completed:   Diagnostic bilateral lumbar facet MBB x1 (06/19/2020)   Therapeutic  Palliative (PRN) options:   None established    Recent Visits Date Type Provider Dept  06/18/20 Office Visit Milinda Pointer, [facility]  05/16/20 Office Visit Milinda Pointer, MD [facility]  Showing recent visits within past 90 days and meeting all other requirements Today's Visits Date Type Provider Dept  06/19/20 Procedure visit Milinda Pointer, MD [facility]  Showing today's visits and meeting all other requirements Future Appointments Date Type Provider Dept  07/04/20 Appointment Milinda Pointer, MD [facility]  Showing future appointments within next 90 days and meeting all other requirements  Disposition: Discharge home  Discharge (Date  Time): 06/19/2020; 1005 hrs.   Primary Care Physician: Jacklynn Barnacle, MD Location: [facility] Note by: Gaspar Cola, MD Date: 06/19/2020; Time: 11:47 AM  Disclaimer:  Medicine is not an Chief Strategy Officer. The only guarantee in medicine is that nothing is guaranteed. It is important to note that the decision to proceed with this intervention was based on the information collected from the patient. The Data and conclusions were drawn from the patient's questionnaire, the interview, and the physical examination. Because the information was provided in large part by the patient, it cannot be  guaranteed that it has not Benton purposely or unconsciously manipulated. Every effort has Benton made to obtain as much relevant data as possible for this evaluation. It is important to note that the conclusions that lead to this procedure are derived in large part from the available data. Always take into account that the treatment will also be dependent on availability of resources and existing treatment guidelines, considered by other Pain Management Practitioners as being common knowledge and practice, at the time of the intervention. For Medico-Legal purposes, it is also important to point out that variation in procedural techniques and pharmacological choices are the acceptable norm. The indications, contraindications, technique, and results of the above procedure should only be interpreted and judged by a Board-Certified Interventional Pain Specialist with extensive familiarity and expertise in the same exact procedure and technique.

## 2020-06-19 ENCOUNTER — Ambulatory Visit (HOSPITAL_BASED_OUTPATIENT_CLINIC_OR_DEPARTMENT_OTHER): Payer: Medicare Other | Admitting: Pain Medicine

## 2020-06-19 ENCOUNTER — Ambulatory Visit
Admission: RE | Admit: 2020-06-19 | Discharge: 2020-06-19 | Disposition: A | Discharge status: Home / Self Care | Payer: Medicare Other | Source: Ambulatory Visit | Attending: Pain Medicine | Admitting: Pain Medicine

## 2020-06-19 ENCOUNTER — Encounter: Payer: Self-pay | Admitting: Pain Medicine

## 2020-06-19 VITALS — BP 140/60 | HR 73 | Temp 97.3°F | Resp 18 | Ht 62.0 in | Wt 123.0 lb

## 2020-06-19 DIAGNOSIS — M47816 Spondylosis without myelopathy or radiculopathy, lumbar region: Secondary | ICD-10-CM | POA: Insufficient documentation

## 2020-06-19 DIAGNOSIS — M545 Low back pain, unspecified: Secondary | ICD-10-CM

## 2020-06-19 DIAGNOSIS — G8929 Other chronic pain: Secondary | ICD-10-CM | POA: Diagnosis present

## 2020-06-19 DIAGNOSIS — M5137 Other intervertebral disc degeneration, lumbosacral region: Secondary | ICD-10-CM | POA: Diagnosis present

## 2020-06-19 DIAGNOSIS — M47817 Spondylosis without myelopathy or radiculopathy, lumbosacral region: Secondary | ICD-10-CM | POA: Diagnosis present

## 2020-06-19 DIAGNOSIS — M5136 Other intervertebral disc degeneration, lumbar region: Secondary | ICD-10-CM | POA: Diagnosis not present

## 2020-06-19 MED ORDER — FENTANYL CITRATE (PF) 100 MCG/2ML IJ SOLN
INTRAMUSCULAR | Status: AC
  Filled 2020-06-19: qty 2

## 2020-06-19 MED ORDER — MIDAZOLAM HCL 5 MG/5ML IJ SOLN
1.0000 mg | INTRAMUSCULAR | Status: DC | PRN
  Administered 2020-06-19: 2 mg via INTRAVENOUS

## 2020-06-19 MED ORDER — ROPIVACAINE HCL 2 MG/ML IJ SOLN
INTRAMUSCULAR | Status: AC
  Filled 2020-06-19: qty 20

## 2020-06-19 MED ORDER — LACTATED RINGERS IV SOLN
1000.0000 mL | Freq: Once | INTRAVENOUS | Status: AC
  Administered 2020-06-19: 1000 mL via INTRAVENOUS

## 2020-06-19 MED ORDER — LIDOCAINE HCL (PF) 2 % IJ SOLN
INTRAMUSCULAR | Status: AC
  Filled 2020-06-19: qty 10

## 2020-06-19 MED ORDER — TRIAMCINOLONE ACETONIDE 40 MG/ML IJ SUSP
80.0000 mg | Freq: Once | INTRAMUSCULAR | Status: AC
  Administered 2020-06-19: 80 mg

## 2020-06-19 MED ORDER — LIDOCAINE HCL 2 % IJ SOLN
20.0000 mL | Freq: Once | INTRAMUSCULAR | Status: AC
  Administered 2020-06-19: 400 mg

## 2020-06-19 MED ORDER — MIDAZOLAM HCL 5 MG/5ML IJ SOLN
INTRAMUSCULAR | Status: AC
  Filled 2020-06-19: qty 5

## 2020-06-19 MED ORDER — ROPIVACAINE HCL 2 MG/ML IJ SOLN
18.0000 mL | Freq: Once | INTRAMUSCULAR | Status: AC
  Administered 2020-06-19: 18 mL via PERINEURAL

## 2020-06-19 MED ORDER — TRIAMCINOLONE ACETONIDE 40 MG/ML IJ SUSP
INTRAMUSCULAR | Status: AC
  Filled 2020-06-19: qty 2

## 2020-06-19 MED ORDER — FENTANYL CITRATE (PF) 100 MCG/2ML IJ SOLN
25.0000 ug | INTRAMUSCULAR | Status: DC | PRN
  Administered 2020-06-19: 50 ug via INTRAVENOUS

## 2020-06-19 NOTE — Progress Notes (Signed)
Safety precautions to be maintained throughout the outpatient stay will include: orient to surroundings, keep bed in low position, maintain call bell within reach at all times, provide assistance with transfer out of bed and ambulation.  

## 2020-06-20 ENCOUNTER — Telehealth: Payer: Self-pay

## 2020-06-20 NOTE — Telephone Encounter (Signed)
Called pt pp no answer, left message to call if needed.

## 2020-07-02 NOTE — Progress Notes (Deleted)
PROVIDER NOTE: Information contained herein reflects review and annotations entered in association with encounter. Interpretation of such information and data should be left to medically-trained personnel. Information provided to patient can be located elsewhere in the medical record under "Patient Instructions". Document created using STT-dictation technology, any transcriptional errors that may result from process are unintentional.    Patient: Kendra Benton  Service Category: E/M  Provider: Gaspar Cola, MD  DOB: 1941/02/11  DOS: 07/04/2020  Specialty: Interventional Pain Management  MRN: [id number]  Setting: Ambulatory outpatient  PCP: Kendra Barnacle, MD  Type: Established Patient    Referring Provider: Jacklynn Benton  Location: Office  Delivery: Face-to-face     HPI  [name], a 80 y.o. year old female, is here today because of her No primary diagnosis found.. [name] primary complain today is No chief complaint on file. Last encounter: My last encounter with her was on 06/19/2020. Pertinent problems: [name] has Dupuytren's contracture; Fibromyalgia; History of night sweats; Osteoarthritis; Head ache; Atypical chest pain; Carcinoma, lung, right ([place]); Chronic low back pain (1ry area of Pain) (Bilateral) (L>R) w/o sciatica; Mass of upper lobe of right lung; Chronic nonintractable headache; Chronic pain syndrome; Failed back surgical syndrome; Chronic hip pain (2ry area of Pain) (Bilateral) (R>L); Chronic lower extremity pain (Left); Numbness and tingling of both feet (intermittent/recurrent); Pain in rib (Bilateral) (R>L) (intermittent); Chronic knee pain (Left); Patellar pain (Left) (chronic, intermittent); Lumbar facet joint syndrome; Spondylosis without myelopathy or radiculopathy, lumbosacral region; and DDD (degenerative disc disease), lumbosacral on their pertinent problem list. Pain Assessment: Severity of   is reported as a  /10. Location:    / .  Onset:  . Quality:  . Timing:  . Modifying factor(s):  Marland Kitchen Vitals:  vitals were not taken for this visit.   Reason for encounter:  ***   ***  Pharmacotherapy Assessment   Analgesic: None MME/day: 0 mg/day   Monitoring: [place] PMP: PDMP reviewed during this encounter.       Pharmacotherapy: No side-effects or adverse reactions reported. Compliance: No problems identified. Effectiveness: Clinically acceptable.  No notes on file  UDS:  Summary  Date Value Ref Range Status  05/16/2020 Note  Final    Comment:    ==================================================================== Compliance Drug Analysis, Ur ==================================================================== Test                             Result       Flag       Units  Drug Present and Declared for Prescription Verification   Oxazepam                       3036         EXPECTED   ng/mg creat   Temazepam                      >9091        EXPECTED   ng/mg creat    Oxazepam and temazepam are expected metabolites of diazepam.    Oxazepam is also an expected metabolite of other benzodiazepine    drugs, including chlordiazepoxide, prazepam, clorazepate, halazepam,    and temazepam.  Oxazepam and temazepam are available as scheduled    prescription medications.    Gabapentin                     PRESENT  EXPECTED   Oxcarbazepine MHD              PRESENT      EXPECTED    Oxcarbazepine MHD is the active metabolite of oxcarbazepine and    eslicarbazepine.    Duloxetine                     PRESENT      EXPECTED   Acetaminophen                  PRESENT      EXPECTED   Naproxen                       PRESENT      EXPECTED ==================================================================== Test                      Result    Flag   Units      Ref Range   Creatinine              22               mg/dL      >=20 ==================================================================== Declared Medications:  The flagging and  interpretation on this report are based on the  following declared medications.  Unexpected results may arise from  inaccuracies in the declared medications.   **Note: The testing scope of this panel includes these medications:   Duloxetine (Cymbalta)  Gabapentin (Neurontin)  Naproxen (Aleve)  Oxcarbazepine (Trileptal)  Temazepam (Restoril)   **Note: The testing scope of this panel does not include small to  moderate amounts of these reported medications:   Acetaminophen (Tylenol)   **Note: The testing scope of this panel does not include the  following reported medications:   Albuterol  Atorvastatin (Lipitor)  Dicyclomine (Bentyl)  Doxycycline (Doryx)  Estradiol (Estrace)  Eye Drops  Furosemide (Lasix)  Lisinopril (Zestril)  Losartan (Cozaar)  Omeprazole (Prilosec) ==================================================================== For clinical consultation, please call [phone]. ====================================================================      ROS  Constitutional: Denies any fever or chills Gastrointestinal: No reported hemesis, hematochezia, vomiting, or acute GI distress Musculoskeletal: Denies any acute onset joint swelling, redness, loss of ROM, or weakness Neurological: No reported episodes of acute onset apraxia, aphasia, dysarthria, agnosia, amnesia, paralysis, loss of coordination, or loss of consciousness  Medication Review  Albuterol, Carboxymethylcellulose Sodium, DULoxetine, OXcarbazepine, Turmeric, acetaminophen, atorvastatin, calcium-vitamin D, dicyclomine, estradiol, furosemide, gabapentin, lisinopril, loperamide, losartan, naproxen sodium, omeprazole, polycarbophil, potassium chloride SA, temazepam, and traMADol  History Review  Allergy: [name] is allergic to spironolactone, hydrochlorothiazide, ciprofloxacin, hydralazine, levofloxacin, albuterol, amlodipine, and latex. Drug: [name]  reports no history of drug use. Alcohol:   reports current alcohol use of about 10.0 standard drinks of alcohol per week. Tobacco:  reports that she quit smoking about 42 years ago. Her smoking use included cigarettes. She has a 20.00 pack-year smoking history. She has never used smokeless tobacco. Social: Ms. Picha  reports that she quit smoking about 42 years ago. Her smoking use included cigarettes. She has a 20.00 pack-year smoking history. She has never used smokeless tobacco. She reports current alcohol use of about 10.0 standard drinks of alcohol per week. She reports that she does not use drugs. Medical:  has a past medical history of Allergy, Anxiety, GERD (gastroesophageal reflux disease), Headache, History of kidney stones, Hypertension, Myocardial infarction ([place]), Osteoporosis, and Pneumonia. Surgical: Ms. Kizer  has a past surgical history that includes Cholecystectomy (  N/A, 02/28/2016); Spine surgery; Abdominal hysterectomy; Appendectomy; Lumbar fusion (11/09/2016); Cardiac catheterization; Back surgery; Tonsillectomy; Cataract extraction w/ intraocular lens  implant, bilateral (Bilateral, 2010); Artery Biopsy (Right, 12/30/2017); and Artery Biopsy (Left, 06/18/2018). Family: family history includes Arthritis in her mother; Cancer (age of onset: 28) in her sister; Cancer (age of onset: 16) in her father and mother; Heart disease in her father and mother.  Laboratory Chemistry Profile   Renal Lab Results  Component Value Date   BUN 20 05/16/2020   CREATININE 0.81 05/16/2020   BCR 25 05/16/2020   GFRAA 79 05/16/2020   GFRNONAA 68 05/16/2020     Hepatic Lab Results  Component Value Date   AST 16 05/16/2020   ALT 17 08/27/2018   ALBUMIN 4.1 05/16/2020   ALKPHOS 86 05/16/2020   LIPASE 32 08/27/2018     Electrolytes Lab Results  Component Value Date   NA 135 05/16/2020   K 4.6 05/16/2020   CL 93 (L) 05/16/2020   CALCIUM 9.4 05/16/2020   MG 1.7 05/16/2020     Bone Lab Results  Component Value Date   25OHVITD1 35  05/16/2020   25OHVITD2 <1.0 05/16/2020   25OHVITD3 35 05/16/2020     Inflammation (CRP: Acute Phase) (ESR: Chronic Phase) Lab Results  Component Value Date   CRP 1 05/16/2020   ESRSEDRATE 2 05/16/2020   LATICACIDVEN 1.5 02/27/2016       Note: Above Lab results reviewed.  Recent Imaging Review  DG PAIN CLINIC C-ARM 1-60 MIN NO REPORT Fluoro was used, but no Radiologist interpretation will be provided.  Please refer to "NOTES" tab for provider progress note. Note: Reviewed        Physical Exam  General appearance: Well nourished, well developed, and well hydrated. In no apparent acute distress Mental status: Alert, oriented x 3 (person, place, & time)       Respiratory: No evidence of acute respiratory distress Eyes: PERLA Vitals: There were no vitals taken for this visit. BMI: Estimated body mass index is 22.5 kg/m as calculated from the following:   Height as of 06/19/20: '5\' 2"'  (1.575 m).   Weight as of 06/19/20: 123 lb (55.8 kg). Ideal: Ideal body weight: 50.1 kg (110 lb 7.2 oz) Adjusted ideal body weight: 52.4 kg (115 lb 7.5 oz)  Assessment   Status Diagnosis  Controlled Controlled Controlled No diagnosis found.   Updated Problems: No problems updated.  Plan of Care  Problem-specific:  No problem-specific Assessment & Plan notes found for this encounter.  [name] has a current medication list which includes the following long-term medication(s): albuterol, atorvastatin, calcium-vitamin d, dicyclomine, duloxetine, duloxetine, estradiol, furosemide, lisinopril, losartan, omeprazole, oxcarbazepine, temazepam, and tramadol.  Pharmacotherapy (Medications Ordered): No orders of the defined types were placed in this encounter.  Orders:  No orders of the defined types were placed in this encounter.  Follow-up plan:   No follow-ups on file.      Interventional Therapies  Risk  Complexity Considerations:   Advanced age  Latex allergy  For candidate  for Lumbar RF secondary to hardware     Planned  Pending:      Under consideration:   Possible diagnostic midline caudal ESI + diagnostic epidurogram #1  Diagnostic bilateral IA hip joint injection  Diagnostic left IA knee injection    Completed:   Diagnostic bilateral lumbar facet MBB x1 (06/19/2020)   Therapeutic  Palliative (PRN) options:   None established     Recent Visits Date Type  Provider Dept  06/19/20 Procedure visit Milinda Pointer, MD [facility]  06/18/20 Office Visit Milinda Pointer, MD [facility]  05/16/20 Office Visit Milinda Pointer, MD [facility]  Showing recent visits within past 90 days and meeting all other requirements Future Appointments Date Type Provider Dept  07/04/20 Appointment Milinda Pointer, MD [facility]  Showing future appointments within next 90 days and meeting all other requirements  I discussed the assessment and treatment plan with the patient. The patient was provided an opportunity to ask questions and all were answered. The patient agreed with the plan and demonstrated an understanding of the instructions.  Patient advised to call back or seek an in-person evaluation if the symptoms or condition worsens.  Duration of encounter: *** minutes.  Note by: Kendra Cola, MD Date: 07/04/2020; Time: 8:06 AM

## 2020-07-03 ENCOUNTER — Ambulatory Visit: Payer: Medicare Other | Attending: Pain Medicine | Admitting: Pain Medicine

## 2020-07-03 ENCOUNTER — Other Ambulatory Visit: Payer: Self-pay

## 2020-07-03 ENCOUNTER — Encounter: Payer: Self-pay | Admitting: Pain Medicine

## 2020-07-03 VITALS — BP 152/81 | HR 89 | Temp 97.6°F | Resp 16 | Ht 62.0 in | Wt 125.0 lb

## 2020-07-03 DIAGNOSIS — G8929 Other chronic pain: Secondary | ICD-10-CM | POA: Insufficient documentation

## 2020-07-03 DIAGNOSIS — M47816 Spondylosis without myelopathy or radiculopathy, lumbar region: Secondary | ICD-10-CM | POA: Insufficient documentation

## 2020-07-03 DIAGNOSIS — M545 Low back pain, unspecified: Secondary | ICD-10-CM | POA: Diagnosis not present

## 2020-07-03 DIAGNOSIS — M25551 Pain in right hip: Secondary | ICD-10-CM | POA: Diagnosis not present

## 2020-07-03 DIAGNOSIS — M25552 Pain in left hip: Secondary | ICD-10-CM | POA: Insufficient documentation

## 2020-07-03 DIAGNOSIS — M79605 Pain in left leg: Secondary | ICD-10-CM | POA: Diagnosis present

## 2020-07-03 DIAGNOSIS — G894 Chronic pain syndrome: Secondary | ICD-10-CM | POA: Insufficient documentation

## 2020-07-03 DIAGNOSIS — Z79899 Other long term (current) drug therapy: Secondary | ICD-10-CM

## 2020-07-03 DIAGNOSIS — F1120 Opioid dependence, uncomplicated: Secondary | ICD-10-CM | POA: Diagnosis present

## 2020-07-03 MED ORDER — TRAMADOL HCL 50 MG PO TABS: 50.0000 mg | ORAL_TABLET | Freq: Two times a day (BID) | ORAL | 0 refills | Status: DC | PRN

## 2020-07-03 NOTE — Progress Notes (Signed)
PROVIDER NOTE: Information contained herein reflects review and annotations entered in association with encounter. Interpretation of such information and data should be left to medically-trained personnel. Information provided to patient can be located elsewhere in the medical record under "Patient Instructions". Document created using STT-dictation technology, any transcriptional errors that may result from process are unintentional.    Patient: Kendra Benton  Service Category: E/M  Provider: Gaspar Cola, MD  DOB: Jul 82, 1942  DOS: 07/03/2020  Specialty: Interventional Pain Management  MRN: [id number]  Setting: Ambulatory outpatient  PCP: Jacklynn Barnacle, MD  Type: Established Patient    Referring Provider: Jacklynn Barnacle  Location: Office  Delivery: Face-to-face     HPI  [name], a 80 y.o. year old female, is here today because of her Chronic pain syndrome [G89.4]. Ms. Baune primary complain today is Back Pain (lower) Last encounter: My last encounter with her was on 06/19/2020. Pertinent problems: Ms. Macfadden has Dupuytren's contracture; Fibromyalgia; History of night sweats; Osteoarthritis; Head ache; Atypical chest pain; Carcinoma, lung, right ([place]); Chronic low back pain (1ry area of Pain) (Bilateral) (L>R) w/o sciatica; Mass of upper lobe of right lung; Chronic nonintractable headache; Chronic pain syndrome; Failed back surgical syndrome; Chronic hip pain (2ry area of Pain) (Bilateral) (R>L); Chronic lower extremity pain (Left); Numbness and tingling of both feet (intermittent/recurrent); Pain in rib (Bilateral) (R>L) (intermittent); Chronic knee pain (Left); Patellar pain (Left) (chronic, intermittent); Lumbar facet joint syndrome; Spondylosis without myelopathy or radiculopathy, lumbosacral region; and DDD (degenerative disc disease), lumbosacral on their pertinent problem list. Pain Assessment: Severity of Chronic pain is reported as a 3 /10. Location:  Back Lower/both hips, left leg to the shin. Onset: More than a month ago. Quality: Aching. Timing: Intermittent. Modifying factor(s): medications, stretching, heating pad. Vitals:  height is 5' 2" (1.575 m) and weight is 125 lb (56.7 kg). Her temporal temperature is 97.6 F (36.4 C). Her blood pressure is 152/81 (abnormal) and her pulse is 89. Her respiration is 16 and oxygen saturation is 100%.   Reason for encounter: both, medication management and post-procedure assessment.   The patient indicates doing well with the current medication regimen. No adverse reactions or side effects reported to the medications.  The patient indicates that she has actually taken only 3 to 4 pills since she had her prescription.  She says that she is averaging less than 1 pill/day.  However, she is also taking up to 4 Advil per day.  I recommended to her to ease off of some of those Advil and to keep it to no more than 2/day.  I instructed her to use the tramadol a little bit more frequently if needed.  With regards to the diagnostic bilateral lumbar facet block, this provided the patient with 100% relief of the pain for the duration of the local anesthetic which actually lasted longer than the expected duration of the local anesthetic and the patient indicated that the numbness wore off the next morning and that when the pain started returning.  However, she only had 50% benefit at that point which later improved to her current 80%.  She refers that it has allowed her to go shopping without any problems, which she was certainly having in the past.  Since she continues to have this 80% ongoing relief, we will hold on any further injections until the pain starts coming back.  She was instructed to give [place] a call when that happens and at that point will proceed with the  second diagnostic bilateral lumbar facet block.  Today I spoke to the patient about the long-term plan and I informed her that should the pain continue to return,  which I would expect it to, then we will consider radiofrequency ablation should her second diagnostic shows results similar to this first 1.  She understood the plan and agree.  Post-Procedure Evaluation  Procedure (06/19/2020): Diagnostic bilateral lumbar facet block #1 under fluoroscopic guidance and IV sedation Pre-procedure pain level: 6/10 Post-procedure: 0/10 (100% relief)  Sedation: Sedation provided.  Effectiveness during initial hour after procedure(Ultra-Short Term Relief): 100 %.  Local anesthetic used: Long-acting (4-6 hours) Effectiveness: Defined as any analgesic benefit obtained secondary to the administration of local anesthetics. This carries significant diagnostic value as to the etiological location, or anatomical origin, of the pain. Duration of benefit is expected to coincide with the duration of the local anesthetic used.  Effectiveness during initial 4-6 hours after procedure(Short-Term Relief): 100 %.  Long-term benefit: Defined as any relief past the pharmacologic duration of the local anesthetics.  Effectiveness past the initial 6 hours after procedure(Long-Term Relief): 80 %.  Current benefits: Defined as benefit that persist at this time.   Analgesia:  80% improved.  Function: Ms. Capili reports improvement in function ROM: Ms. Beahm reports improvement in ROM  Pharmacotherapy Assessment   Analgesic: None MME/day: 0 mg/day   Monitoring: [place] PMP: PDMP not reviewed this encounter.       Pharmacotherapy: No side-effects or adverse reactions reported. Compliance: No problems identified. Effectiveness: Clinically acceptable.  Landis Martins, RN  07/03/2020 11:31 AM  Sign when Signing Visit Nursing Pain Medication Assessment:  Safety precautions to be maintained throughout the outpatient stay will include: orient to surroundings, keep bed in low position, maintain call bell within reach at all times, provide assistance with transfer out of bed and  ambulation.  Medication Inspection Compliance: Pill count conducted under aseptic conditions, in front of the patient. Neither the pills nor the bottle was removed from the patient's sight at any time. Once count was completed pills were immediately returned to the patient in their original bottle.  Medication: Tramadol (Ultram) Pill/Patch Count: 54 of 60 pills remain Pill/Patch Appearance: Markings consistent with prescribed medication Bottle Appearance: Standard pharmacy container. Clearly labeled. Filled Date: 06/18/2020 Last Medication intake:  Today    UDS:  Summary  Date Value Ref Range Status  05/16/2020 Note  Final    Comment:    ==================================================================== Compliance Drug Analysis, Ur ==================================================================== Test                             Result       Flag       Units  Drug Present and Declared for Prescription Verification   Oxazepam                       3036         EXPECTED   ng/mg creat   Temazepam                      >9091        EXPECTED   ng/mg creat    Oxazepam and temazepam are expected metabolites of diazepam.    Oxazepam is also an expected metabolite of other benzodiazepine    drugs, including chlordiazepoxide, prazepam, clorazepate, halazepam,    and temazepam.  Oxazepam and temazepam are available as scheduled  prescription medications.    Gabapentin                     PRESENT      EXPECTED   Oxcarbazepine MHD              PRESENT      EXPECTED    Oxcarbazepine MHD is the active metabolite of oxcarbazepine and    eslicarbazepine.    Duloxetine                     PRESENT      EXPECTED   Acetaminophen                  PRESENT      EXPECTED   Naproxen                       PRESENT      EXPECTED ==================================================================== Test                      Result    Flag   Units      Ref Range   Creatinine              22                mg/dL      >=20 ==================================================================== Declared Medications:  The flagging and interpretation on this report are based on the  following declared medications.  Unexpected results may arise from  inaccuracies in the declared medications.   **Note: The testing scope of this panel includes these medications:   Duloxetine (Cymbalta)  Gabapentin (Neurontin)  Naproxen (Aleve)  Oxcarbazepine (Trileptal)  Temazepam (Restoril)   **Note: The testing scope of this panel does not include small to  moderate amounts of these reported medications:   Acetaminophen (Tylenol)   **Note: The testing scope of this panel does not include the  following reported medications:   Albuterol  Atorvastatin (Lipitor)  Dicyclomine (Bentyl)  Doxycycline (Doryx)  Estradiol (Estrace)  Eye Drops  Furosemide (Lasix)  Lisinopril (Zestril)  Losartan (Cozaar)  Omeprazole (Prilosec) ==================================================================== For clinical consultation, please call [phone]. ====================================================================      ROS  Constitutional: Denies any fever or chills Gastrointestinal: No reported hemesis, hematochezia, vomiting, or acute GI distress Musculoskeletal: Denies any acute onset joint swelling, redness, loss of ROM, or weakness Neurological: No reported episodes of acute onset apraxia, aphasia, dysarthria, agnosia, amnesia, paralysis, loss of coordination, or loss of consciousness  Medication Review  Albuterol, Carboxymethylcellulose Sodium, DULoxetine, OXcarbazepine, Turmeric, acetaminophen, atorvastatin, calcium-vitamin D, dicyclomine, estradiol, furosemide, gabapentin, lisinopril, loperamide, losartan, naproxen sodium, omeprazole, polycarbophil, potassium chloride SA, temazepam, and traMADol  History Review  Allergy: Ms. Labrecque is allergic to spironolactone, hydrochlorothiazide,  ciprofloxacin, hydralazine, levofloxacin, amlodipine, and latex. Drug: Ms. Scheck  reports no history of drug use. Alcohol:  reports current alcohol use of about 10.0 standard drinks of alcohol per week. Tobacco:  reports that she quit smoking about 42 years ago. Her smoking use included cigarettes. She has a 20.00 pack-year smoking history. She has never used smokeless tobacco. Social: Ms. Kilbride  reports that she quit smoking about 42 years ago. Her smoking use included cigarettes. She has a 20.00 pack-year smoking history. She has never used smokeless tobacco. She reports current alcohol use of about 10.0 standard drinks of alcohol per week. She reports that she does not use drugs. Medical:  has a past medical history  of Allergy, Anxiety, GERD (gastroesophageal reflux disease), Headache, History of kidney stones, Hypertension, Myocardial infarction ([place]), Osteoporosis, and Pneumonia. Surgical: Ms. Tal  has a past surgical history that includes Cholecystectomy (N/A, 02/28/2016); Spine surgery; Abdominal hysterectomy; Appendectomy; Lumbar fusion (11/09/2016); Cardiac catheterization; Back surgery; Tonsillectomy; Cataract extraction w/ intraocular lens  implant, bilateral (Bilateral, 2010); Artery Biopsy (Right, 12/30/2017); and Artery Biopsy (Left, 06/18/2018). Family: family history includes Arthritis in her mother; Cancer (age of onset: 78) in her sister; Cancer (age of onset: 46) in her father and mother; Heart disease in her father and mother.  Laboratory Chemistry Profile   Renal Lab Results  Component Value Date   BUN 20 05/16/2020   CREATININE 0.81 05/16/2020   BCR 25 05/16/2020   GFRAA 79 05/16/2020   GFRNONAA 68 05/16/2020     Hepatic Lab Results  Component Value Date   AST 16 05/16/2020   ALT 17 08/27/2018   ALBUMIN 4.1 05/16/2020   ALKPHOS 86 05/16/2020   LIPASE 32 08/27/2018     Electrolytes Lab Results  Component Value Date   NA 135 05/16/2020   K 4.6 05/16/2020   CL  93 (L) 05/16/2020   CALCIUM 9.4 05/16/2020   MG 1.7 05/16/2020     Bone Lab Results  Component Value Date   25OHVITD1 35 05/16/2020   25OHVITD2 <1.0 05/16/2020   25OHVITD3 35 05/16/2020     Inflammation (CRP: Acute Phase) (ESR: Chronic Phase) Lab Results  Component Value Date   CRP 1 05/16/2020   ESRSEDRATE 2 05/16/2020   LATICACIDVEN 1.5 02/27/2016       Note: Above Lab results reviewed.  Recent Imaging Review  DG PAIN CLINIC C-ARM 1-60 MIN NO REPORT Fluoro was used, but no Radiologist interpretation will be provided.  Please refer to "NOTES" tab for provider progress note. Note: Reviewed        Physical Exam  General appearance: Well nourished, well developed, and well hydrated. In no apparent acute distress Mental status: Alert, oriented x 3 (person, place, & time)       Respiratory: No evidence of acute respiratory distress Eyes: PERLA Vitals: BP (!) 152/81   Pulse 89   Temp 97.6 F (36.4 C) (Temporal)   Resp 16   Ht 5' 2" (1.575 m)   Wt 125 lb (56.7 kg)   SpO2 100%   BMI 22.86 kg/m  BMI: Estimated body mass index is 22.86 kg/m as calculated from the following:   Height as of this encounter: 5' 2" (1.575 m).   Weight as of this encounter: 125 lb (56.7 kg). Ideal: Ideal body weight: 50.1 kg (110 lb 7.2 oz) Adjusted ideal body weight: 52.7 kg (116 lb 4.3 oz)  Assessment   Status Diagnosis  Controlled Improved Improved 1. Chronic pain syndrome   2. 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 Lumbar facet joint syndrome   4. Chronic hip pain (2ry area of Pain) (Bilateral) (R>L)   5. Chronic lower extremity pain (Left)   6. Pharmacologic therapy   7. Uncomplicated opioid dependence ([place])      Updated Problems: No problems updated.  Plan of Care  Problem-specific:  No problem-specific Assessment & Plan notes found for this encounter.  [name] has a current medication list which includes the following  long-term medication(s): albuterol, atorvastatin, calcium-vitamin d, dicyclomine, duloxetine, duloxetine, estradiol, furosemide, losartan, omeprazole, oxcarbazepine, temazepam, lisinopril, and [START ON 07/18/2020] tramadol.  Pharmacotherapy (Medications Ordered): Meds ordered this  encounter  Medications  . traMADol (ULTRAM) 50 MG tablet    Sig: Take 1 tablet (50 mg total) by mouth 2 (two) times daily as needed for severe pain. Must last 30 days    Dispense:  30 tablet    Refill:  0    Chronic Pain: STOP Act (Not applicable) Fill 1 day early if closed on refill date. Avoid benzodiazepines within 8 hours of opioids   Orders:  Orders Placed This Encounter  Procedures  . LUMBAR FACET(MEDIAL BRANCH NERVE BLOCK) MBNB    Standing Status:   Standing    Number of Occurrences:   1    Standing Expiration Date:   12/31/2020    Scheduling Instructions:     Procedure: Lumbar facet block (AKA.: Lumbosacral medial branch nerve block)     Side: Bilateral     Level: L3-4, L4-5, & L5-S1 Facets (L2, L3, L4, L5, & S1 Medial Branch Nerves)     Sedation: Patient's choice.     Timeframe: PRN.  She will call when the pain returns.    Order Specific Question:   Where will this procedure be performed?    Answer:   [facility]  . ToxASSURE Select 13 (MW), Urine    Volume: 30 ml(s). Minimum 3 ml of urine is needed. Document temperature of fresh sample. Indications: Long term (current) use of opiate analgesic (E15.830)    Order Specific Question:   Release to patient    Answer:   Immediate   Follow-up plan:   Return in about 6 weeks (around 08/17/2020) for (F2F), (Med Mgmt).      Interventional Therapies  Risk  Complexity Considerations:   Advanced age  Latex allergy  For candidate for Lumbar RF secondary to hardware     Planned  Pending:      Under consideration:   Possible diagnostic midline caudal ESI + diagnostic epidurogram #1  Diagnostic bilateral IA hip joint injection  Diagnostic  left IA knee injection    Completed:   Diagnostic bilateral lumbar facet MBB x1 (06/19/2020)   Therapeutic  Palliative (PRN) options:   None established     Recent Visits Date Type Provider Dept  06/19/20 Procedure visit Milinda Pointer, MD [facility]  06/18/20 Office Visit Milinda Pointer, [facility]  05/16/20 Office Visit Milinda Pointer, MD [facility]  Showing recent visits within past 90 days and meeting all other requirements Today's Visits Date Type Provider Dept  07/03/20 Office Visit Milinda Pointer, MD [facility]  Showing today's visits and meeting all other requirements Future Appointments Date Type Provider Dept  08/06/20 Appointment Milinda Pointer, MD [facility]  Showing future appointments within next 90 days and meeting all other requirements  I discussed the assessment and treatment plan with the patient. The patient was provided an opportunity to ask questions and all were answered. The patient agreed with the plan and demonstrated an understanding of the instructions.  Patient advised to call back or seek an in-person evaluation if the symptoms or condition worsens.  Duration of encounter: 30 minutes.  Note by: Gaspar Cola, MD Date: 07/03/2020; Time: 1:17 PM

## 2020-07-03 NOTE — Patient Instructions (Addendum)
____________________________________________________________________________________________  Medication Rules  Purpose: To inform patients, and their family members, of our rules and regulations.  Applies to: All patients receiving prescriptions (written or electronic).  Pharmacy of record: Pharmacy where electronic prescriptions will be sent. If written prescriptions are taken to a different pharmacy, please inform the nursing staff. The pharmacy listed in the electronic medical record should be the one where you would like electronic prescriptions to be sent.  Electronic prescriptions: In compliance with the [place] (STOP) Act of 2017 (Session Lanny Cramp [phone]), effective June 09, 2018, all controlled substances must be electronically prescribed. Calling prescriptions to the pharmacy will cease to exist.  Prescription refills: Only during scheduled appointments. Applies to all prescriptions.  NOTE: The following applies primarily to controlled substances (Opioid* Pain Medications).   Type of encounter (visit): For patients receiving controlled substances, face-to-face visits are required. (Not an option or up to the patient.)  Patient's responsibilities: 1. Pain Pills: Bring all pain pills to every appointment (except for procedure appointments). 2. Pill Bottles: Bring pills in original pharmacy bottle. Always bring the newest bottle. Bring bottle, even if empty. 3. Medication refills: You are responsible for knowing and keeping track of what medications you take and those you need refilled. The day before your appointment: write a list of all prescriptions that need to be refilled. The day of the appointment: give the list to the admitting nurse. Prescriptions will be written only during appointments. No prescriptions will be written on procedure days. If you forget a medication: it will not be "Called in", "Faxed", or "electronically sent".  You will need to get another appointment to get these prescribed. No early refills. Do not call asking to have your prescription filled early. 4. Prescription Accuracy: You are responsible for carefully inspecting your prescriptions before leaving our office. Have the discharge nurse carefully go over each prescription with you, before taking them home. Make sure that your name is accurately spelled, that your address is correct. Check the name and dose of your medication to make sure it is accurate. Check the number of pills, and the written instructions to make sure they are clear and accurate. Make sure that you are given enough medication to last until your next medication refill appointment. 5. Taking Medication: Take medication as prescribed. When it comes to controlled substances, taking less pills or less frequently than prescribed is permitted and encouraged. Never take more pills than instructed. Never take medication more frequently than prescribed.  6. Inform other Doctors: Always inform, all of your healthcare providers, of all the medications you take. 7. Pain Medication from other Providers: You are not allowed to accept any additional pain medication from any other Doctor or Healthcare provider. There are two exceptions to this rule. (see below) In the event that you require additional pain medication, you are responsible for notifying us, as stated below. 8. Cough Medicine: Often these contain an opioid, such as codeine or hydrocodone. Never accept or take cough medicine containing these opioids if you are already taking an opioid* medication. The combination may cause respiratory failure and death. 9. Medication Agreement: You are responsible for carefully reading and following our Medication Agreement. This must be signed before receiving any prescriptions from our practice. Safely store a copy of your signed Agreement. Violations to the Agreement will result in no further prescriptions.  (Additional copies of our Medication Agreement are available upon request.) 10. Laws, Rules, & Regulations: All patients are expected to follow all  Federal and [employer], [employer], [employer], [employer]. Ignorance of the Laws does not constitute a valid excuse.  11. Illegal drugs and Controlled Substances: The use of illegal substances (including, but not limited to marijuana and its derivatives) and/or the illegal use of any controlled substances is strictly prohibited. Violation of this rule may result in the immediate and permanent discontinuation of any and all prescriptions being written by our practice. The use of any illegal substances is prohibited. 12. Adopted CDC guidelines & recommendations: Target dosing levels will be at or below 60 MME/day. Use of benzodiazepines** is not recommended.  Exceptions: There are only two exceptions to the rule of not receiving pain medications from other Healthcare Providers. 1. Exception #1 (Emergencies): In the event of an emergency (i.e.: accident requiring emergency care), you are allowed to receive additional pain medication. However, you are responsible for: As soon as you are able, call our office [phone], at any time of the day or night, and leave a message stating your name, the date and nature of the emergency, and the name and dose of the medication prescribed. In the event that your call is answered by a member of our staff, make sure to document and save the date, time, and the name of the person that took your information.  2. Exception #2 (Planned Surgery): In the event that you are scheduled by another doctor or dentist to have any type of surgery or procedure, you are allowed (for a period no longer than 30 days), to receive additional pain medication, for the acute post-op pain. However, in this case, you are responsible for picking up a copy of our "Post-op Pain Management for Surgeons" handout, and giving it to your surgeon or dentist. This  document is available at our office, and does not require an appointment to obtain it. Simply go to our office during business hours (Monday-Thursday from 8:00 AM to 4:00 PM) (Friday 8:00 AM to 12:00 Noon) or if you have a scheduled appointment with [place], prior to your surgery, and ask for it by name. In addition, you are responsible for: calling our office [phone], at any time of the day or night, and leaving a message stating your name, name of your surgeon, type of surgery, and date of procedure or surgery. Failure to comply with your responsibilities may result in termination of therapy involving the controlled substances.  *Opioid medications include: morphine, codeine, oxycodone, oxymorphone, hydrocodone, hydromorphone, meperidine, tramadol, tapentadol, buprenorphine, fentanyl, methadone. **Benzodiazepine medications include: diazepam (Valium), alprazolam (Xanax), clonazepam (Klonopine), lorazepam (Ativan), clorazepate (Tranxene), chlordiazepoxide (Librium), estazolam (Prosom), oxazepam (Serax), temazepam (Restoril), triazolam (Halcion) (Last updated: 05/07/2020) ____________________________________________________________________________________________   ____________________________________________________________________________________________  Medication Recommendations and Reminders  Applies to: All patients receiving prescriptions (written and/or electronic).  Medication Rules & Regulations: These rules and regulations exist for your safety and that of others. They are not flexible and neither are we. Dismissing or ignoring them will be considered "non-compliance" with medication therapy, resulting in complete and irreversible termination of such therapy. (See document titled "Medication Rules" for more details.) In all conscience, because of safety reasons, we cannot continue providing a therapy where the patient does not follow instructions.  Pharmacy of record:   Definition:  This is the pharmacy where your electronic prescriptions will be sent.   We do not endorse any particular pharmacy, however, we have experienced problems with [facility] not securing enough medication supply for the community.  We do not restrict you in your choice of pharmacy. However,  once we write for your prescriptions, we will NOT be re-sending more prescriptions to fix restricted supply problems created by your pharmacy, or your insurance.   The pharmacy listed in the electronic medical record should be the one where you want electronic prescriptions to be sent.  If you choose to change pharmacy, simply notify our nursing staff.  Recommendations:  Keep all of your pain medications in a safe place, under lock and key, even if you live alone. We will NOT replace lost, stolen, or damaged medication.  After you fill your prescription, take 1 week's worth of pills and put them away in a safe place. You should keep a separate, properly labeled bottle for this purpose. The remainder should be kept in the original bottle. Use this as your primary supply, until it runs out. Once it's gone, then you know that you have 1 week's worth of medicine, and it is time to come in for a prescription refill. If you do this correctly, it is unlikely that you will ever run out of medicine.  To make sure that the above recommendation works, it is very important that you make sure your medication refill appointments are scheduled at least 1 week before you run out of medicine. To do this in an effective manner, make sure that you do not leave the office without scheduling your next medication management appointment. Always ask the nursing staff to show you in your prescription , when your medication will be running out. Then arrange for the receptionist to get you a return appointment, at least 7 days before you run out of medicine. Do not wait until you have 1 or 2 pills left, to come in. This is very poor planning and  does not take into consideration that we may need to cancel appointments due to bad weather, sickness, or emergencies affecting our staff.  DO NOT ACCEPT A "Partial Fill": If for any reason your pharmacy does not have enough pills/tablets to completely fill or refill your prescription, do not allow for a "partial fill". The law allows the pharmacy to complete that prescription within 72 hours, without requiring a new prescription. If they do not fill the rest of your prescription within those 72 hours, you will need a separate prescription to fill the remaining amount, which we will NOT provide. If the reason for the partial fill is your insurance, you will need to talk to the pharmacist about payment alternatives for the remaining tablets, but again, DO NOT ACCEPT A PARTIAL FILL, unless you can trust your pharmacist to obtain the remainder of the pills within 72 hours.  Prescription refills and/or changes in medication(s):   Prescription refills, and/or changes in dose or medication, will be conducted only during scheduled medication management appointments. (Applies to both, written and electronic prescriptions.)  No refills on procedure days. No medication will be changed or started on procedure days. No changes, adjustments, and/or refills will be conducted on a procedure day. Doing so will interfere with the diagnostic portion of the procedure.  No phone refills. No medications will be "called into the pharmacy".  No Fax refills.  No weekend refills.  No Holliday refills.  No after hours refills.  Remember:  Business hours are:  Monday to Thursday 8:00 AM to 4:00 PM Provider's Schedule: Milinda Pointer, MD - Appointments are:  Medication management: Monday and Wednesday 8:00 AM to 4:00 PM Procedure day: Tuesday and Thursday 7:30 AM to 4:00 PM Gillis Santa, MD - Appointments are:  Medication management: Tuesday and Thursday 8:00 AM to 4:00 PM Procedure day: Monday and Wednesday  7:30 AM to 4:00 PM (Last update: 12/28/2019) ____________________________________________________________________________________________   ____________________________________________________________________________________________  Drug Holidays (Slow)  What is a "Drug Holiday"? Drug Holiday: is the name given to the period of time during which a patient stops taking a medication(s) for the purpose of eliminating tolerance to the drug.  Benefits . Improved effectiveness of opioids. . Decreased opioid dose needed to achieve benefits. . Improved pain with lesser dose.  What is tolerance? Tolerance: is the progressive decreased in effectiveness of a drug due to its repetitive use. With repetitive use, the body gets use to the medication and as a consequence, it loses its effectiveness. This is a common problem seen with opioid pain medications. As a result, a larger dose of the drug is needed to achieve the same effect that used to be obtained with a smaller dose.  How long should a "Drug Holiday" last? You should stay off of the pain medicine for at least 14 consecutive days. (2 weeks)  Should I stop the medicine "cold [place]"? No. You should always coordinate with your Pain Specialist so that he/she can provide you with the correct medication dose to make the transition as smoothly as possible.  How do I stop the medicine? Slowly. You will be instructed to decrease the daily amount of pills that you take by one (1) pill every seven (7) days. This is called a "slow downward taper" of your dose. For example: if you normally take four (4) pills per day, you will be asked to drop this dose to three (3) pills per day for seven (7) days, then to two (2) pills per day for seven (7) days, then to one (1) per day for seven (7) days, and at the end of those last seven (7) days, this is when the "Drug Holiday" would start.   Will I have withdrawals? By doing a "slow downward taper" like this one,  it is unlikely that you will experience any significant withdrawal symptoms. Typically, what triggers withdrawals is the sudden stop of a high dose opioid therapy. Withdrawals can usually be avoided by slowly decreasing the dose over a prolonged period of time. If you do not follow these instructions and decide to stop your medication abruptly, withdrawals may be possible.  What are withdrawals? Withdrawals: refers to the wide range of symptoms that occur after stopping or dramatically reducing opiate drugs after heavy and prolonged use. Withdrawal symptoms do not occur to patients that use low dose opioids, or those who take the medication sporadically. Contrary to benzodiazepine (example: Valium, Xanax, etc.) or alcohol withdrawals ("Delirium Tremens"), opioid withdrawals are not lethal. Withdrawals are the physical manifestation of the body getting rid of the excess receptors.  Expected Symptoms Early symptoms of withdrawal may include: . Agitation . Anxiety . Muscle aches . Increased tearing . Insomnia . Runny nose . Sweating . Yawning  Late symptoms of withdrawal may include: . Abdominal cramping . Diarrhea . Dilated pupils . Goose bumps . Nausea . Vomiting  Will I experience withdrawals? Due to the slow nature of the taper, it is very unlikely that you will experience any.  What is a slow taper? Taper: refers to the gradual decrease in dose.  (Last update: 12/28/2019) ____________________________________________________________________________________________    ____________________________________________________________________________________________  CBD (cannabidiol) WARNING  Applicable to: All individuals currently taking or considering taking CBD (cannabidiol) and, more important, all patients taking opioid analgesic controlled substances (pain  medication). (Example: oxycodone; oxymorphone; hydrocodone; hydromorphone; morphine; methadone; tramadol; tapentadol;  fentanyl; buprenorphine; butorphanol; dextromethorphan; meperidine; codeine; etc.)  Legal status: CBD remains a Schedule I drug prohibited for any use. CBD is illegal with one exception. In the [place], CBD has a limited Transport planner (FDA) approval for the treatment of two specific types of epilepsy disorders. Only one CBD product has been approved by the FDA for this purpose: "Epidiolex". FDA is aware that some companies are marketing products containing cannabis and cannabis-derived compounds in ways that violate the [employer], Drug and Cosmetic Act [facility] Act) and that may put the health and safety of consumers at risk. The FDA, a Federal agency, has not enforced the CBD status since 2018.   Legality: Some manufacturers ship CBD products nationally, which is illegal. Often such products are sold online and are therefore available throughout the country. CBD is openly sold in head shops and health food stores in some states where such sales have not been explicitly legalized. Selling unapproved products with unsubstantiated therapeutic claims is not only a violation of the law, but also can put patients at risk, as these products have not been proven to be safe or effective. Federal illegality makes it difficult to conduct research on CBD.  Reference: "FDA Regulation of Cannabis and Cannabis-Derived Products, Including Cannabidiol (CBD)" - [employer]  Warning: CBD is not FDA approved and has not undergo the same manufacturing controls as prescription drugs.  This means that the purity and safety of available CBD may be questionable. Most of the time, despite manufacturer's claims, it is contaminated with THC (delta-9-tetrahydrocannabinol - the chemical in marijuana responsible for the "HIGH").  When this is the case, the [facility] contaminant will trigger a positive  urine drug screen (UDS) test for Marijuana (carboxy-THC). Because a positive UDS for any illicit substance is a violation of our medication agreement, your opioid analgesics (pain medicine) may be permanently discontinued.  MORE ABOUT CBD  General Information: CBD  is a derivative of the Marijuana (cannabis sativa) plant discovered in 26. It is one of the 113 identified substances found in Marijuana. It accounts for up to 40% of the plant's extract. As of 2018, preliminary clinical studies on CBD included research for the treatment of anxiety, movement disorders, and pain. CBD is available and consumed in multiple forms, including inhalation of smoke or vapor, as an aerosol spray, and by mouth. It may be supplied as an oil containing CBD, capsules, dried cannabis, or as a liquid solution. CBD is thought not to be as psychoactive as THC (delta-9-tetrahydrocannabinol - the chemical in marijuana responsible for the "HIGH"). Studies suggest that CBD may interact with different biological target receptors in the body, including cannabinoid and other neurotransmitter receptors. As of 2018 the mechanism of action for its biological effects has not been determined.  Side-effects  Adverse reactions: Dry mouth, diarrhea, decreased appetite, fatigue, drowsiness, malaise, weakness, sleep disturbances, and others.  Drug interactions: CBC may interact with other medications such as blood-thinners. (Last update: 01/14/2020) ____________________________________________________________________________________________   ____________________________________________________________________________________________  Preparing for Procedure with Sedation  Procedure appointments are limited to planned procedures: . No Prescription Refills. . No disability issues will be discussed. . No medication changes will be discussed.  Instructions: . Oral Intake: Do not eat or drink anything for at least 8 hours prior to  your procedure. (Exception: Blood Pressure Medication. See below.) . Transportation: Unless otherwise stated by your physician, you may drive yourself after the procedure. . Blood Pressure  Medicine: Do not forget to take your blood pressure medicine with a sip of water the morning of the procedure. If your Diastolic (lower reading)is above 100 mmHg, elective cases will be cancelled/rescheduled. . Blood thinners: These will need to be stopped for procedures. Notify our staff if you are taking any blood thinners. Depending on which one you take, there will be specific instructions on how and when to stop it. . Diabetics on insulin: Notify the staff so that you can be scheduled 1st case in the morning. If your diabetes requires high dose insulin, take only  of your normal insulin dose the morning of the procedure and notify the staff that you have done so. . Preventing infections: Shower with an antibacterial soap the morning of your procedure. . Build-up your immune system: Take 1000 mg of Vitamin C with every meal (3 times a day) the day prior to your procedure. Marland Kitchen Antibiotics: Inform the staff if you have a condition or reason that requires you to take antibiotics before dental procedures. . Pregnancy: If you are pregnant, call and cancel the procedure. . Sickness: If you have a cold, fever, or any active infections, call and cancel the procedure. . Arrival: You must be in the facility at least 30 minutes prior to your scheduled procedure. . Children: Do not bring children with you. . Dress appropriately: Bring dark clothing that you would not mind if they get stained. . Valuables: Do not bring any jewelry or valuables.  Reasons to call and reschedule or cancel your procedure: (Following these recommendations will minimize the risk of a serious complication.) . Surgeries: Avoid having procedures within 2 weeks of any surgery. (Avoid for 2 weeks before or after any surgery). . Flu Shots: Avoid having  procedures within 2 weeks of a flu shots or . (Avoid for 2 weeks before or after immunizations). . Barium: Avoid having a procedure within 7-10 days after having had a radiological study involving the use of radiological contrast. (Myelograms, Barium swallow or enema study). . Heart attacks: Avoid any elective procedures or surgeries for the initial 6 months after a "Myocardial Infarction" (Heart Attack). . Blood thinners: It is imperative that you stop these medications before procedures. Let us know if you if you take any blood thinner.  . Infection: Avoid procedures during or within two weeks of an infection (including chest colds or gastrointestinal problems). Symptoms associated with infections include: Localized redness, fever, chills, night sweats or profuse sweating, burning sensation when voiding, cough, congestion, stuffiness, runny nose, sore throat, diarrhea, nausea, vomiting, cold or Flu symptoms, recent or current infections. It is specially important if the infection is over the area that we intend to treat. Marland Kitchen Heart and lung problems: Symptoms that may suggest an active cardiopulmonary problem include: cough, chest pain, breathing difficulties or shortness of breath, dizziness, ankle swelling, uncontrolled high or unusually low blood pressure, and/or palpitations. If you are experiencing any of these symptoms, cancel your procedure and contact your primary care physician for an evaluation.  Remember:  Regular Business hours are:  Monday to Thursday 8:00 AM to 4:00 PM  Provider's Schedule: Milinda Pointer, MD:  Procedure days: Tuesday and Thursday 7:30 AM to 4:00 PM  Gillis Santa, MD:  Procedure days: Monday and Wednesday 7:30 AM to 4:00 PM ____________________________________________________________________________________________

## 2020-07-03 NOTE — Progress Notes (Signed)
Nursing Pain Medication Assessment:  Safety precautions to be maintained throughout the outpatient stay will include: orient to surroundings, keep bed in low position, maintain call bell within reach at all times, provide assistance with transfer out of bed and ambulation.  Medication Inspection Compliance: Pill count conducted under aseptic conditions, in front of the patient. Neither the pills nor the bottle was removed from the patient's sight at any time. Once count was completed pills were immediately returned to the patient in their original bottle.  Medication: Tramadol (Ultram) Pill/Patch Count: 54 of 60 pills remain Pill/Patch Appearance: Markings consistent with prescribed medication Bottle Appearance: Standard pharmacy container. Clearly labeled. Filled Date: 06/18/2020 Last Medication intake:  Today

## 2020-07-04 ENCOUNTER — Ambulatory Visit: Payer: Medicare Other | Admitting: Pain Medicine

## 2020-07-10 LAB — TOXASSURE SELECT 13 (MW), URINE

## 2020-07-17 ENCOUNTER — Ambulatory Visit: Payer: Medicare Other | Attending: Internal Medicine

## 2020-07-17 ENCOUNTER — Other Ambulatory Visit: Payer: Self-pay

## 2020-07-17 VITALS — BP 119/63 | HR 90 | Ht 62.0 in | Wt 125.0 lb

## 2020-07-17 DIAGNOSIS — R262 Difficulty in walking, not elsewhere classified: Secondary | ICD-10-CM | POA: Diagnosis not present

## 2020-07-17 DIAGNOSIS — R2681 Unsteadiness on feet: Secondary | ICD-10-CM | POA: Diagnosis present

## 2020-07-17 DIAGNOSIS — M6281 Muscle weakness (generalized): Secondary | ICD-10-CM | POA: Diagnosis present

## 2020-07-17 DIAGNOSIS — R2689 Other abnormalities of gait and mobility: Secondary | ICD-10-CM | POA: Diagnosis present

## 2020-07-17 NOTE — Therapy (Signed)
[place] MAIN [facility] SERVICES [place], [place], [address] Phone: [phone]   Fax:  [phone]  Physical Therapy Evaluation  Patient Details  Name: Kendra Benton MRN: [id number] Date of Birth: [birth year] Referring Provider (PT): Dr. Sharia Reeve   Encounter Date: 07/17/2020   PT End of Session - 07/17/20 1716    Visit Number 1    Number of Visits 25    Date for PT Re-Evaluation 10/09/20    Authorization Type Initial PT Evaluation on 07/17/2020    Authorization Time Period 07/17/2020- 10/09/2020    PT Start Time 1330    PT Stop Time 1429    PT Time Calculation (min) 59 min    Equipment Utilized During Treatment Gait belt    Activity Tolerance Patient tolerated treatment well;No increased pain    Behavior During Therapy WFL for tasks assessed/performed           Past Medical History:  Diagnosis Date  . Allergy   . Anxiety   . GERD (gastroesophageal reflux disease)   . Headache   . History of kidney stones   . Hypertension   . Myocardial infarction ([place])    1997  . Osteoporosis   . Pneumonia     Past Surgical History:  Procedure Laterality Date  . ABDOMINAL HYSTERECTOMY     patient has ovaries  . APPENDECTOMY    . ARTERY BIOPSY Right 12/30/2017   Procedure: BIOPSY TEMPORAL ARTERY;  Surgeon: Katha Cabal, MD;  Location: [facility] ORS;  Service: Vascular;  Laterality: Right;  . ARTERY BIOPSY Left 06/18/2018   Procedure: BIOPSY TEMPORAL ARTERY;  Surgeon: Katha Cabal, MD;  Location: [facility] ORS;  Service: Vascular;  Laterality: Left;  . BACK SURGERY    . CARDIAC CATHETERIZATION    . CATARACT EXTRACTION W/ INTRAOCULAR LENS  IMPLANT, BILATERAL Bilateral 2010  . CHOLECYSTECTOMY N/A 02/28/2016   Procedure: LAPAROSCOPIC CHOLECYSTECTOMY WITH INTRAOPERATIVE CHOLANGIOGRAM;  Surgeon: Dia Crawford III, MD;  Location: [facility] ORS;  Service: General;  Laterality: N/A;  . LUMBAR FUSION  11/09/2016   L3-L5  . SPINE SURGERY     Disc   . TONSILLECTOMY      Vitals:   07/17/20 1353  BP: 119/63  Pulse: 90  SpO2: 95%  Weight: 125 lb (56.7 kg)  Height: 5\' 2"  (1.575 m)      Subjective Assessment - 07/17/20 1700    Subjective Patient reports she is here due to having some intermittent low back pain, impaired balance and difficulty walking.    Pertinent History Patient reports history of chronic LBP with past lumbar fusion surgery on 11/09/2016 and most recent lumbar facet joint block procedure on 06/19/2020. She reports past medical history of spondylosis, Lung Cancer, HTN. She reports increased difficulty performing ADL's yet still independent but has paid services who assist with cleaning.    Limitations Standing;Lifting;Walking;House hold activities    How long can you sit comfortably? no restrictions    How long can you stand comfortably? <30 min    How long can you walk comfortably? <10 min    Patient Stated Goals Improve my pain and walk better with no falls.    Currently in Pain? Yes   Chronic low back pain- best = 1/10, current=2/10, worst= 7/10   Pain Score 2     Pain Location Back    Pain Orientation Right    Pain Descriptors / Indicators Aching    Pain Type Chronic pain  Pain Onset More than a month ago    Pain Frequency Intermittent    Aggravating Factors  Prolonged standing/walking    Pain Relieving Factors Rest    Multiple Pain Sites No              [facility] PT Assessment - 07/17/20 1707      Assessment   Medical Diagnosis low back pain/ risk of falling    Referring Provider (PT) Dr. Sharia Reeve    Onset Date/Surgical Date 04/09/20   worsened around 3 months ago   Hand Dominance Right    Prior Therapy yes   for my unsteadiness back in 2021- summer/fall     Precautions   Precautions Fall      Restrictions   Weight Bearing Restrictions No      Balance Screen   Has the patient fallen in the past 6 months Yes    How many times? 1    Has the patient had a decrease in activity level because of a  fear of falling?  Yes    Is the patient reluctant to leave their home because of a fear of falling?  No      Home Environment   Living Environment Private residence    Living Arrangements Alone    Available Help at Discharge Family    Type of [place] to enter    Home Layout One level    [place] - 4 wheels      Prior Function   Level of [place] Retired      Associate Professor   Overall Cognitive Status Within Functional Limits for tasks assessed    Attention Focused    Focused Attention Appears intact    Memory Appears intact    Awareness Appears intact    Problem Solving Appears intact    Executive Function Reasoning;Sequencing;Organizing;Decision Making;Initiating;Self Monitoring;Self Correcting    Reasoning Appears intact    Sequencing Appears intact    Organizing Appears intact    Decision Making Appears intact    Initiating Appears intact    Self Monitoring Appears intact    Self Correcting Appears intact      Observation/Other Assessments   Focus on Therapeutic Outcomes (FOTO)  60      Standardized Balance Assessment   Standardized Balance Assessment Berg Balance Test      Berg Balance Test   Sit to Stand Able to stand without using hands and stabilize independently    Standing Unsupported Able to stand safely 2 minutes    Sitting with Back Unsupported but Feet Supported on Floor or Stool Able to sit safely and securely 2 minutes    Stand to Sit Sits safely with minimal use of hands    Transfers Able to transfer safely, minor use of hands    Standing Unsupported with Eyes Closed Able to stand 10 seconds safely    Standing Unsupported with Feet Together Able to place feet together independently and stand 1 minute safely    From Standing, Reach Forward with Outstretched Arm Can reach forward >12 cm safely (5")    From Standing Position, Pick up Object from Floor Able to pick up shoe safely and easily    From  Standing Position, Turn to Look Behind Over each Shoulder Turn sideways only but maintains balance    Turn 360 Degrees Able to turn 360 degrees safely in 4 seconds or less    Standing Unsupported,  Alternately Place Feet on Step/Stool Able to stand independently and safely and complete 8 steps in 20 seconds    Standing Unsupported, One Foot in Front Able to plae foot ahead of the other independently and hold 30 seconds    Standing on One Leg Tries to lift leg/unable to hold 3 seconds but remains standing independently    Total Score 49            OBJECTIVE  SENSATION: Grossly intact to light touch bilateral LEs as determined by testing dermatomes L2-S2 Proprioception and hot/cold testing deferred on this date   MUSCULOSKELETAL: Tremor: None Bulk: Normal Tone: Normal No visible step-off along spinal column  Posture Lumbar lordosis: WNL Iliac crest height: equal bilaterally Lumbar lateral shift: negative   Gait Patient ambulated without an assistive device in clinic- mild unsteadiness with wide base of support and short reciprocal steps- wide base of support. *Patient reports using cane for outdoor/community distances.   Palpation - Tenderness and increased prominence along T12 SP and right SI joint   Strength (out of 5) R/L 4+/5 Hip flexion 4+/5 Hip ER 4+/5 Hip IR 4+/5 Hip abduction 4+/5 Hip adduction 4+/5 Hip extension 4/5 Knee extension 4/5 Knee flexion 5/5 Ankle dorsiflexion 5/5 Ankle plantarflexion 5 Trunk flexion 5 Trunk extension 5/5 Trunk rotation     AROM (degrees) All lumbar/hip/knee = WNL      Repeated Movements No centralization or peripheralization of symptoms with repeated lumbar extension or flexion.    Thomas: neg bilaterally    Passive Accessory Intervertebral Motion (PAIVM) Pt denies reproduction of back pain with T12- L2 . Generally hypomobile throughout    SPECIAL TESTS  SLR= Neg bilaterally  SIJ:  Thigh Thrust  =neg   ASSESSMENT Clinical Impression: Pt is a pleasant  80 year-old female referred for low back pain and risk of falling. Patient reports intermittent LBP and imbalance at times requiring cane with history of falls. She presents today with right LE muscle weakness with MMT, Impaired balance as seen in Berg balance test and gait abnormality with increased risk of falling.  Pt will benefit from skilled PT services to address above mentioned deficits and return to pain-free function at home and decrease risk of falling.                 Objective measurements completed on examination: See above findings.               PT Education - 07/17/20 1715    Education Details Patient instructed in importance/benefit of outpatient PT services to improve overall condition.    Person(s) Educated Patient    Methods Explanation;Demonstration    Comprehension Verbalized understanding            PT Short Term Goals - 07/17/20 2123      PT SHORT TERM GOAL #1   Title Patient will be independent in home exercise program to improve pain relief/ strength/mobility for better functional independence with ADLs.    Baseline 07/17/2020- Patient doesn't have any formal HEP in place.    Time 6    Period Weeks    Status New    Target Date 08/28/20             PT Long Term Goals - 07/17/20 2125      PT LONG TERM GOAL #1   Title Patient will increase FOTO score to equal to or greater than 63 to demonstrate statistically significant improvement in mobility and quality of life.  Baseline 07/17/2020: FOTO score=60    Time 12    Period Weeks    Status New    Target Date 10/09/20      PT LONG TERM GOAL #2   Title Pt will decrease worst back pain as reported on NPRS by at least 2 points in order to demonstrate clinically significant reduction in back pain.    Baseline 07/17/2020- 5/10 LBP at worst.    Time 12    Period Weeks    Status New    Target Date 10/09/20      PT LONG  TERM GOAL #3   Title Patient (> 66 years old) will complete five times sit to stand test in < 18 seconds indicating an increased LE strength and improved balance.    Baseline 07/17/2020- 26.02 sec    Time 12    Period Weeks    Status New    Target Date 10/09/20      PT LONG TERM GOAL #4   Title Patient will increase Berg Balance score by > 4 points to demonstrate decreased fall risk during functional activities.    Baseline 07/17/2020- BERG = 49/56    Time 12    Period Weeks    Status New    Target Date 10/09/20      PT LONG TERM GOAL #5   Title Patient will increase 10 meter walk test to >1.37m/s as to improve gait speed for better community ambulation and to reduce fall risk.    Baseline 07/17/2020= 10MWT avg=11.0 sec (0.91 m/s) without an AD    Time 12    Period Weeks    Status New    Target Date 10/09/20                  Plan - 07/17/20 2117    Clinical Impression Statement Pt is a pleasant  80 year-old female referred for low back pain and risk of falling. Patient reports intermittent LBP and imbalance at times requiring cane with history of falls. She presents today with right LE muscle weakness with MMT, Impaired balance as seen in Berg balance test and gait abnormality with increased risk of falling.  Pt will benefit from skilled PT services to address above mentioned deficits and return to pain-free function at home and decrease risk of falling.    Personal Factors and Comorbidities Age;Comorbidity 3+    Comorbidities Osteoporosis, HTN, CHF    Examination-Activity Limitations [place];Caring for Others;Carry;Lift;Reach Overhead;Sit;Squat;Stairs;Stand    Examination-Participation Restrictions Cleaning;Community Activity;Laundry;Shop;Yard Work    Merchant navy officer Stable/Uncomplicated    Surveyor, mining    Rehab Potential Good    PT Frequency 2x / week    PT Duration 12 weeks    PT Treatment/Interventions ADLs/Self Care Home  Management;Cryotherapy;Moist Heat;DME Instruction;Stair training;Functional mobility training;Therapeutic activities;Therapeutic exercise;Balance training;Neuromuscular re-education;Patient/family education;Manual techniques    PT Next Visit Plan LE strengthening, Low back stretching, balance training.    PT Home Exercise Plan Will initiate next visit.    Consulted and Agree with Plan of Care Patient           Patient will benefit from skilled therapeutic intervention in order to improve the following deficits and impairments:  Abnormal gait,Decreased balance,Decreased coordination,Decreased activity tolerance,Cardiopulmonary status limiting activity,Decreased endurance,Decreased mobility,Decreased strength,Difficulty walking,Pain  Visit Diagnosis: Difficulty in walking, not elsewhere classified  Unsteadiness on feet  Muscle weakness (generalized)     Problem List Patient Active Problem List   Diagnosis Date Noted  . Lumbar facet  joint syndrome 06/18/2020  . Spondylosis without myelopathy or radiculopathy, lumbosacral region 06/18/2020  . DDD (degenerative disc disease), lumbosacral 06/18/2020  . COVID 06/04/2020  . Chronic pain syndrome 05/16/2020  . Pharmacologic therapy 05/16/2020  . Disorder of skeletal system 05/16/2020  . Problems influencing health status 05/16/2020  . 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 Chronic hip pain (2ry area of Pain) (Bilateral) (R>L) 05/16/2020  . Chronic lower extremity pain (Left) 05/16/2020  . Numbness and tingling of both feet (intermittent/recurrent) 05/16/2020  . Pain in rib (Bilateral) (R>L) (intermittent) 05/16/2020  . Chronic knee pain (Left) 05/16/2020  . Patellar pain (Left) (chronic, intermittent) 05/16/2020  . Hard of hearing 05/16/2020  . Carcinoma, lung, right ([place]) 06/25/2019  . S/P partial lobectomy of lung 02/25/2019  . Mass of upper lobe of right lung 01/06/2019  . Chronic nonintractable headache 06/14/2018  . Head  ache 12/03/2017  . Sinus bradycardia 10/06/2017  . Chronic low back pain (1ry area of Pain) (Bilateral) (L>R) w/o sciatica 08/17/2017  . Panic attack as reaction to stress 08/17/2017  . Anxiety 06/15/2017  . Atypical chest pain 06/15/2017  . Hyperlipidemia 06/15/2017  . Cholecystitis with cholelithiasis 02/27/2016  . Increased frequency of urination 01/27/2015  . History of night sweats 05/01/2014  . Dupuytren's contracture 07/18/2013  . Inverted nipple 04/18/2013  . Depression 04/23/2010  . Fibromyalgia 04/23/2010  . Hypercholesterolemia 04/23/2010  . Essential hypertension 04/23/2010  . Hypothyroidism 04/23/2010  . Irritable colon 04/23/2010  . Osteoarthritis 04/23/2010    Lewis Moccasin, PT 07/18/2020, 9:12 AM  [place] MAIN [facility] SERVICES [address] [place], [place], [address] Phone: [phone]   Fax:  [phone]  Name: Kendra Benton MRN: [id number] Date of Birth: Nov 05, 1940

## 2020-07-19 ENCOUNTER — Ambulatory Visit: Payer: Medicare Other

## 2020-07-19 ENCOUNTER — Other Ambulatory Visit: Payer: Self-pay

## 2020-07-19 DIAGNOSIS — M6281 Muscle weakness (generalized): Secondary | ICD-10-CM

## 2020-07-19 DIAGNOSIS — R2681 Unsteadiness on feet: Secondary | ICD-10-CM

## 2020-07-19 DIAGNOSIS — R2689 Other abnormalities of gait and mobility: Secondary | ICD-10-CM

## 2020-07-19 DIAGNOSIS — R262 Difficulty in walking, not elsewhere classified: Secondary | ICD-10-CM | POA: Diagnosis not present

## 2020-07-19 NOTE — Therapy (Signed)
[place] MAIN [facility] SERVICES [address] [place], [place], [address] Phone: [phone]   Fax:  [phone]  Physical Therapy Treatment  Patient Details  Name: Kendra Benton MRN: [id number] Date of Birth: 01-19-1941 Referring Provider (PT): Dr. Sharia Reeve   Encounter Date: 07/19/2020    Past Medical History:  Diagnosis Date  . Allergy   . Anxiety   . GERD (gastroesophageal reflux disease)   . Headache   . History of kidney stones   . Hypertension   . Myocardial infarction ([place])    1997  . Osteoporosis   . Pneumonia     Past Surgical History:  Procedure Laterality Date  . ABDOMINAL HYSTERECTOMY     patient has ovaries  . APPENDECTOMY    . ARTERY BIOPSY Right 12/30/2017   Procedure: BIOPSY TEMPORAL ARTERY;  Surgeon: Katha Cabal, MD;  Location: [facility] ORS;  Service: Vascular;  Laterality: Right;  . ARTERY BIOPSY Left 06/18/2018   Procedure: BIOPSY TEMPORAL ARTERY;  Surgeon: Katha Cabal, MD;  Location: [facility] ORS;  Service: Vascular;  Laterality: Left;  . BACK SURGERY    . CARDIAC CATHETERIZATION    . CATARACT EXTRACTION W/ INTRAOCULAR LENS  IMPLANT, BILATERAL Bilateral 2010  . CHOLECYSTECTOMY N/A 02/28/2016   Procedure: LAPAROSCOPIC CHOLECYSTECTOMY WITH INTRAOPERATIVE CHOLANGIOGRAM;  Surgeon: Dia Crawford III, MD;  Location: [facility] ORS;  Service: General;  Laterality: N/A;  . LUMBAR FUSION  11/09/2016   L3-L5  . SPINE SURGERY     Disc  . TONSILLECTOMY      There were no vitals filed for this visit.             Nustep seat level 5 (resistance level 2) x 5 min LE only with cues to maintain at 50SPM and to stop if any LBP- patient denied pain and able to complete without difficulty.  Instruction in self stretching:  Bilateral HS - using bed sheet- supine - hold 20 sec x 4 Double knee to chest x 20 sec hold x 4 Lower trunk rotation- left to right x 20 sec hold each side x 3 Piriformis stretch- using bed sheet x  20 sec x 3 sets each.    Instructed in core stabilization:  Initiation of transverse abdominal contraction- Hold 5 sec with a rolled up bed sheet positioned in lumbar region to maintain neutral spine- Patient was able to hold for 5 sec without pain. Repetitive verbal cues and visual demonstration on how to correct contract these muscles.   Pt educated throughout session about proper posture and technique with exercises. Improved exercise technique, movement at target joints, use of target muscles after min to mod verbal, visual, tactile cues.                PT Short Term Goals - 07/17/20 2123      PT SHORT TERM GOAL #1   Title Patient will be independent in home exercise program to improve pain relief/ strength/mobility for better functional independence with ADLs.    Baseline 07/17/2020- Patient doesn't have any formal HEP in place.    Time 6    Period Weeks    Status New    Target Date 08/28/20             PT Long Term Goals - 07/17/20 2125      PT LONG TERM GOAL #1   Title Patient will increase FOTO score to equal to or greater than 63 to demonstrate statistically significant improvement in  mobility and quality of life.    Baseline 07/17/2020: FOTO score=60    Time 12    Period Weeks    Status New    Target Date 10/09/20      PT LONG TERM GOAL #2   Title Pt will decrease worst back pain as reported on NPRS by at least 2 points in order to demonstrate clinically significant reduction in back pain.    Baseline 07/17/2020- 5/10 LBP at worst.    Time 12    Period Weeks    Status New    Target Date 10/09/20      PT LONG TERM GOAL #3   Title Patient (> 86 years old) will complete five times sit to stand test in < 18 seconds indicating an increased LE strength and improved balance.    Baseline 07/17/2020- 26.02 sec    Time 12    Period Weeks    Status New    Target Date 10/09/20      PT LONG TERM GOAL #4   Title Patient will increase Berg Balance score by >  4 points to demonstrate decreased fall risk during functional activities.    Baseline 07/17/2020- BERG = 49/56    Time 12    Period Weeks    Status New    Target Date 10/09/20      PT LONG TERM GOAL #5   Title Patient will increase 10 meter walk test to >1.12m/s as to improve gait speed for better community ambulation and to reduce fall risk.    Baseline 07/17/2020= 10MWT avg=11.0 sec (0.91 m/s) without an AD    Time 12    Period Weeks    Status New    Target Date 10/09/20                  Patient will benefit from skilled therapeutic intervention in order to improve the following deficits and impairments:     Visit Diagnosis: No diagnosis found.     Problem List Patient Active Problem List   Diagnosis Date Noted  . Lumbar facet joint syndrome 06/18/2020  . Spondylosis without myelopathy or radiculopathy, lumbosacral region 06/18/2020  . DDD (degenerative disc disease), lumbosacral 06/18/2020  . COVID 06/04/2020  . Chronic pain syndrome 05/16/2020  . Pharmacologic therapy 05/16/2020  . Disorder of skeletal system 05/16/2020  . Problems influencing health status 05/16/2020  . 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 Chronic hip pain (2ry area of Pain) (Bilateral) (R>L) 05/16/2020  . Chronic lower extremity pain (Left) 05/16/2020  . Numbness and tingling of both feet (intermittent/recurrent) 05/16/2020  . Pain in rib (Bilateral) (R>L) (intermittent) 05/16/2020  . Chronic knee pain (Left) 05/16/2020  . Patellar pain (Left) (chronic, intermittent) 05/16/2020  . Hard of hearing 05/16/2020  . Carcinoma, lung, right ([place]) 06/25/2019  . S/P partial lobectomy of lung 02/25/2019  . Mass of upper lobe of right lung 01/06/2019  . Chronic nonintractable headache 06/14/2018  . Head ache 12/03/2017  . Sinus bradycardia 10/06/2017  . Chronic low back pain (1ry area of Pain) (Bilateral) (L>R) w/o sciatica 08/17/2017  . Panic attack as reaction to stress 08/17/2017  .  Anxiety 06/15/2017  . Atypical chest pain 06/15/2017  . Hyperlipidemia 06/15/2017  . Cholecystitis with cholelithiasis 02/27/2016  . Increased frequency of urination 01/27/2015  . History of night sweats 05/01/2014  . Dupuytren's contracture 07/18/2013  . Inverted nipple 04/18/2013  . Depression 04/23/2010  . Fibromyalgia 04/23/2010  . Hypercholesterolemia 04/23/2010  .  Essential hypertension 04/23/2010  . Hypothyroidism 04/23/2010  . Irritable colon 04/23/2010  . Osteoarthritis 04/23/2010    Lewis Moccasin, PT 07/19/2020, 2:41 PM  [place] MAIN [facility] SERVICES [address] [place], [place], [address] Phone: [phone]   Fax:  [phone]  Name: Kendra Benton MRN: [id number] Date of Birth: 1940/10/12

## 2020-07-24 ENCOUNTER — Other Ambulatory Visit: Payer: Self-pay

## 2020-07-24 ENCOUNTER — Ambulatory Visit: Payer: Medicare Other

## 2020-07-24 DIAGNOSIS — R262 Difficulty in walking, not elsewhere classified: Secondary | ICD-10-CM | POA: Diagnosis not present

## 2020-07-24 DIAGNOSIS — M6281 Muscle weakness (generalized): Secondary | ICD-10-CM

## 2020-07-24 DIAGNOSIS — R2689 Other abnormalities of gait and mobility: Secondary | ICD-10-CM

## 2020-07-24 DIAGNOSIS — R2681 Unsteadiness on feet: Secondary | ICD-10-CM

## 2020-07-24 NOTE — Therapy (Signed)
[place] MAIN [facility] SERVICES [place], [place], [address] Phone: [phone]   Fax:  [phone]  Physical Therapy Treatment  Patient Details  Name: Kendra Benton MRN: [id number] Date of Birth: 1940/09/07 Referring Provider (PT): Dr. Sharia Reeve   Encounter Date: 07/24/2020   PT End of Session - 07/24/20 1200    Visit Number 3    Number of Visits 25    Date for PT Re-Evaluation 10/09/20    Authorization Type Initial PT Evaluation on 07/17/2020    Authorization Time Period 07/17/2020- 10/09/2020    PT Start Time 1146    PT Stop Time 1230    PT Time Calculation (min) 44 min    Equipment Utilized During Treatment Gait belt    Activity Tolerance Patient tolerated treatment well;No increased pain    Behavior During Therapy WFL for tasks assessed/performed           Past Medical History:  Diagnosis Date  . Allergy   . Anxiety   . GERD (gastroesophageal reflux disease)   . Headache   . History of kidney stones   . Hypertension   . Myocardial infarction ([place])    1997  . Osteoporosis   . Pneumonia     Past Surgical History:  Procedure Laterality Date  . ABDOMINAL HYSTERECTOMY     patient has ovaries  . APPENDECTOMY    . ARTERY BIOPSY Right 12/30/2017   Procedure: BIOPSY TEMPORAL ARTERY;  Surgeon: Katha Cabal, MD;  Location: [facility] ORS;  Service: Vascular;  Laterality: Right;  . ARTERY BIOPSY Left 06/18/2018   Procedure: BIOPSY TEMPORAL ARTERY;  Surgeon: Katha Cabal, MD;  Location: [facility] ORS;  Service: Vascular;  Laterality: Left;  . BACK SURGERY    . CARDIAC CATHETERIZATION    . CATARACT EXTRACTION W/ INTRAOCULAR LENS  IMPLANT, BILATERAL Bilateral 2010  . CHOLECYSTECTOMY N/A 02/28/2016   Procedure: LAPAROSCOPIC CHOLECYSTECTOMY WITH INTRAOPERATIVE CHOLANGIOGRAM;  Surgeon: Dia Crawford III, MD;  Location: [facility] ORS;  Service: General;  Laterality: N/A;  . LUMBAR FUSION  11/09/2016   L3-L5  . SPINE SURGERY     Disc   . TONSILLECTOMY      There were no vitals filed for this visit.   Subjective Assessment - 07/24/20 1156    Subjective Patient reports she was really tired after her last session. Reports symptoms are about the same. Reports some acute left anterior ankle numbness/stingy sensation at imes.    Pertinent History Patient reports history of chronic LBP with past lumbar fusion surgery on 11/09/2016 and most recent lumbar facet joint block procedure on 06/19/2020. She reports past medical history of spondylosis, Lung Cancer, HTN. She reports increased difficulty performing ADL's yet still independent but has paid services who assist with cleaning.    Limitations Standing;Lifting;Walking;House hold activities    How long can you sit comfortably? no restrictions    How long can you stand comfortably? <30 min    How long can you walk comfortably? <10 min    Patient Stated Goals Improve my pain and walk better with no falls.    Currently in Pain? Yes    Pain Score 3     Pain Location Hip    Pain Orientation Right    Pain Descriptors / Indicators Aching;Sore    Pain Type Chronic pain    Pain Onset More than a month ago    Pain Frequency Intermittent    Aggravating Factors  walking or prlonged standing.  Pain Relieving Factors rest and meds         Reviewed the below stretching and core stabilization   Bilateral HS - using bed sheet- supine - hold 20 sec x 2 Double knee to chest x 20 sec hold x 3 Lower trunk rotation- left to right x 20 sec hold each side x 3 Piriformis stretch- using bed sheet x 20 sec x 3 sets each.    Reviewed core stabilization:  Reviewed transverse abdominal contraction- Hold 5 sec with a rolled up bed sheet positioned in lumbar region to maintain neutral spine- Patient was able to hold for 5 sec without pain. Repetitive verbal cues and visual demonstration on how to correct contract these muscles.   Patient presented with right posterior hip/Low back pain- measured leg  length- both equal from ASIS to medial malleoli however presented with ant rotated inominate with observation- instructed in the following:  Instructed in muscle energy strategy including push/pull using PVC pipe with patient extending right LE and flexing left hip x 5 sec hold x 5 sets. Patient then retested with more equal leg length in supine/long sit and no complaint of pain.   Pt educated throughout session about proper posture and technique with exercises. Improved exercise technique, movement at target joints, use of target muscles after min to mod verbal, visual, tactile cues.                              PT Education - 07/25/20 1731    Education Details Educated in specific exercise technique with Low back stretching and core stabilization.    Person(s) Educated Patient    Methods Explanation;Demonstration;Tactile cues;Verbal cues    Comprehension Verbalized understanding;Tactile cues required;Returned demonstration;Need further instruction;Verbal cues required            PT Short Term Goals - 07/17/20 2123      PT SHORT TERM GOAL #1   Title Patient will be independent in home exercise program to improve pain relief/ strength/mobility for better functional independence with ADLs.    Baseline 07/17/2020- Patient doesn't have any formal HEP in place.    Time 6    Period Weeks    Status New    Target Date 08/28/20             PT Long Term Goals - 07/17/20 2125      PT LONG TERM GOAL #1   Title Patient will increase FOTO score to equal to or greater than 63 to demonstrate statistically significant improvement in mobility and quality of life.    Baseline 07/17/2020: FOTO score=60    Time 12    Period Weeks    Status New    Target Date 10/09/20      PT LONG TERM GOAL #2   Title Pt will decrease worst back pain as reported on NPRS by at least 2 points in order to demonstrate clinically significant reduction in back pain.    Baseline 07/17/2020-  5/10 LBP at worst.    Time 12    Period Weeks    Status New    Target Date 10/09/20      PT LONG TERM GOAL #3   Title Patient (> 74 years old) will complete five times sit to stand test in < 18 seconds indicating an increased LE strength and improved balance.    Baseline 07/17/2020- 26.02 sec    Time 12    Period Weeks  Status New    Target Date 10/09/20      PT LONG TERM GOAL #4   Title Patient will increase Berg Balance score by > 4 points to demonstrate decreased fall risk during functional activities.    Baseline 07/17/2020- BERG = 49/56    Time 12    Period Weeks    Status New    Target Date 10/09/20      PT LONG TERM GOAL #5   Title Patient will increase 10 meter walk test to >1.77m/s as to improve gait speed for better community ambulation and to reduce fall risk.    Baseline 07/17/2020= 10MWT avg=11.0 sec (0.91 m/s) without an AD    Time 12    Period Weeks    Status New    Target Date 10/09/20                 Plan - 07/24/20 1734    Clinical Impression Statement Patient performed well with review of low back stretches and core stabilization. She also presented intially with an anterior rotated inominate which improved with muscle energy techniques today and pateint denied pain at end of session. She will continue to benefit from skilled PT services to improve her overall pain and progress toward safe mobility in home and community with decreased risk of falling.    Personal Factors and Comorbidities Age;Comorbidity 3+    Comorbidities Osteoporosis, HTN, CHF    Examination-Activity Limitations Bend;Caring for Others;Carry;Lift;Reach Overhead;Sit;Squat;Stairs;Stand    Examination-Participation Restrictions Cleaning;Community Activity;Laundry;Shop;Yard Work    Stability/Clinical Decision Making Stable/Uncomplicated    Rehab Potential Good    PT Frequency 2x / week    PT Duration 12 weeks    PT Treatment/Interventions ADLs/Self Care Home  Management;Cryotherapy;Moist Heat;DME Instruction;Stair training;Functional mobility training;Therapeutic activities;Therapeutic exercise;Balance training;Neuromuscular re-education;Patient/family education;Manual techniques    PT Next Visit Plan LE strengthening, Low back stretching, manual therapy for pain relief,  balance training.    Consulted and Agree with Plan of Care Patient           Patient will benefit from skilled therapeutic intervention in order to improve the following deficits and impairments:  Abnormal gait,Decreased balance,Decreased coordination,Decreased activity tolerance,Cardiopulmonary status limiting activity,Decreased endurance,Decreased mobility,Decreased strength,Difficulty walking,Pain  Visit Diagnosis: Difficulty in walking, not elsewhere classified  Unsteadiness on feet  Muscle weakness (generalized)  Other abnormalities of gait and mobility     Problem List Patient Active Problem List   Diagnosis Date Noted  . Lumbar facet joint syndrome 06/18/2020  . Spondylosis without myelopathy or radiculopathy, lumbosacral region 06/18/2020  . DDD (degenerative disc disease), lumbosacral 06/18/2020  . COVID 06/04/2020  . Chronic pain syndrome 05/16/2020  . Pharmacologic therapy 05/16/2020  . Disorder of skeletal system 05/16/2020  . Problems influencing health status 05/16/2020  . 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 Chronic hip pain (2ry area of Pain) (Bilateral) (R>L) 05/16/2020  . Chronic lower extremity pain (Left) 05/16/2020  . Numbness and tingling of both feet (intermittent/recurrent) 05/16/2020  . Pain in rib (Bilateral) (R>L) (intermittent) 05/16/2020  . Chronic knee pain (Left) 05/16/2020  . Patellar pain (Left) (chronic, intermittent) 05/16/2020  . Hard of hearing 05/16/2020  . Carcinoma, lung, right ([place]) 06/25/2019  . S/P partial lobectomy of lung 02/25/2019  . Mass of upper lobe of right lung 01/06/2019  . Chronic nonintractable  headache 06/14/2018  . Head ache 12/03/2017  . Sinus bradycardia 10/06/2017  . Chronic low back pain (1ry area of Pain) (Bilateral) (L>R) w/o sciatica 08/17/2017  .  Panic attack as reaction to stress 08/17/2017  . Anxiety 06/15/2017  . Atypical chest pain 06/15/2017  . Hyperlipidemia 06/15/2017  . Cholecystitis with cholelithiasis 02/27/2016  . Increased frequency of urination 01/27/2015  . History of night sweats 05/01/2014  . Dupuytren's contracture 07/18/2013  . Inverted nipple 04/18/2013  . Depression 04/23/2010  . Fibromyalgia 04/23/2010  . Hypercholesterolemia 04/23/2010  . Essential hypertension 04/23/2010  . Hypothyroidism 04/23/2010  . Irritable colon 04/23/2010  . Osteoarthritis 04/23/2010    Lewis Moccasin, PT 07/25/2020, 5:45 PM  [place] MAIN [facility] SERVICES [address] [place], [place], [address] Phone: [phone]   Fax:  [phone]  Name: Kendra Benton MRN: [id number] Date of Birth: Jun 27, 1940

## 2020-07-26 ENCOUNTER — Other Ambulatory Visit: Payer: Self-pay

## 2020-07-26 ENCOUNTER — Ambulatory Visit: Payer: Medicare Other

## 2020-07-26 DIAGNOSIS — R2689 Other abnormalities of gait and mobility: Secondary | ICD-10-CM

## 2020-07-26 DIAGNOSIS — R2681 Unsteadiness on feet: Secondary | ICD-10-CM

## 2020-07-26 DIAGNOSIS — M6281 Muscle weakness (generalized): Secondary | ICD-10-CM

## 2020-07-26 DIAGNOSIS — R262 Difficulty in walking, not elsewhere classified: Secondary | ICD-10-CM

## 2020-07-26 NOTE — Therapy (Signed)
[place] MAIN [facility] SERVICES [address] [place], [place], [address] Phone: [phone]   Fax:  [phone]  Physical Therapy Treatment  Patient Details  Name: Kendra Benton MRN: [id number] Date of Birth: [birth year] Referring Provider (PT): Dr. Sharia Reeve   Encounter Date: 07/26/2020   PT End of Session - 07/26/20 2058    Visit Number 4    Number of Visits 25    Date for PT Re-Evaluation 10/09/20    Authorization Type Initial PT Evaluation on 07/17/2020    Authorization Time Period 07/17/2020- 10/09/2020    PT Start Time 1016    PT Stop Time 1059    PT Time Calculation (min) 43 min    Equipment Utilized During Treatment Gait belt    Activity Tolerance Patient tolerated treatment well;No increased pain    Behavior During Therapy WFL for tasks assessed/performed           Past Medical History:  Diagnosis Date  . Allergy   . Anxiety   . GERD (gastroesophageal reflux disease)   . Headache   . History of kidney stones   . Hypertension   . Myocardial infarction ([place])    1997  . Osteoporosis   . Pneumonia     Past Surgical History:  Procedure Laterality Date  . ABDOMINAL HYSTERECTOMY     patient has ovaries  . APPENDECTOMY    . ARTERY BIOPSY Right 12/30/2017   Procedure: BIOPSY TEMPORAL ARTERY;  Surgeon: Katha Cabal, MD;  Location: [facility] ORS;  Service: Vascular;  Laterality: Right;  . ARTERY BIOPSY Left 06/18/2018   Procedure: BIOPSY TEMPORAL ARTERY;  Surgeon: Katha Cabal, MD;  Location: [facility] ORS;  Service: Vascular;  Laterality: Left;  . BACK SURGERY    . CARDIAC CATHETERIZATION    . CATARACT EXTRACTION W/ INTRAOCULAR LENS  IMPLANT, BILATERAL Bilateral 2010  . CHOLECYSTECTOMY N/A 02/28/2016   Procedure: LAPAROSCOPIC CHOLECYSTECTOMY WITH INTRAOPERATIVE CHOLANGIOGRAM;  Surgeon: Dia Crawford III, MD;  Location: [facility] ORS;  Service: General;  Laterality: N/A;  . LUMBAR FUSION  11/09/2016   L3-L5  . SPINE SURGERY     Disc   . TONSILLECTOMY      There were no vitals filed for this visit.   Subjective Assessment - 07/26/20 1026    Subjective Patient reports she did some housework yesterday and pretty sore and then sat for 1 hours in a folding chair at her grandsons basketball game. "But overall, I think I am doing better. "    Pertinent History Patient reports history of chronic LBP with past lumbar fusion surgery on 11/09/2016 and most recent lumbar facet joint block procedure on 06/19/2020. She reports past medical history of spondylosis, Lung Cancer, HTN. She reports increased difficulty performing ADL's yet still independent but has paid services who assist with cleaning.    Limitations Standing;Lifting;Walking;House hold activities    How long can you sit comfortably? no restrictions    How long can you stand comfortably? <30 min    How long can you walk comfortably? <10 min    Patient Stated Goals Improve my pain and walk better with no falls.    Currently in Pain? No/denies    Pain Onset More than a month ago           Therex:  Instructed in muscle energy techniques including use of PVC pipe under left LE and over right distal thigh with prompt to press down with left and up with right- hold contraction  x 5 sec x 5 sets today.   Instructed patient in supine Hip ext activities including isometric straight leg press down with heel placed on top of bolster and instructed patient to hold contraction x 5 sec x 10 reps.  Instructed in single leg bridging (right LE) with left LE straight x 10 reps.  Patient able to perform without increased report of pain.  Added muscle energy exercise listed above to HEP today and issued handout.  Reassessed functional LE and patient presents with near equal in supine and long sit after session.   Reviewed and performed Transverse abdominal contraction with towel roll x 5 sec x 10 reps. Added TA contraction while alt leg lift (foot 6" off mat) x 10. Patient able to  perform well with no increase in pain.  Pt educated throughout session about proper posture and technique with exercises. Improved exercise technique, movement at target joints, use of target muscles after min to mod verbal, visual, tactile cues.                            PT Education - 07/26/20 1036    Education Details Core exercise/Muscle energy strategies.    Person(s) Educated Patient    Methods Explanation;Demonstration;Verbal cues;Tactile cues    Comprehension Verbalized understanding;Tactile cues required;Returned demonstration;Need further instruction;Verbal cues required            PT Short Term Goals - 07/17/20 2123      PT SHORT TERM GOAL #1   Title Patient will be independent in home exercise program to improve pain relief/ strength/mobility for better functional independence with ADLs.    Baseline 07/17/2020- Patient doesn't have any formal HEP in place.    Time 6    Period Weeks    Status New    Target Date 08/28/20             PT Long Term Goals - 07/17/20 2125      PT LONG TERM GOAL #1   Title Patient will increase FOTO score to equal to or greater than 63 to demonstrate statistically significant improvement in mobility and quality of life.    Baseline 07/17/2020: FOTO score=60    Time 12    Period Weeks    Status New    Target Date 10/09/20      PT LONG TERM GOAL #2   Title Pt will decrease worst back pain as reported on NPRS by at least 2 points in order to demonstrate clinically significant reduction in back pain.    Baseline 07/17/2020- 5/10 LBP at worst.    Time 12    Period Weeks    Status New    Target Date 10/09/20      PT LONG TERM GOAL #3   Title Patient (> 68 years old) will complete five times sit to stand test in < 18 seconds indicating an increased LE strength and improved balance.    Baseline 07/17/2020- 26.02 sec    Time 12    Period Weeks    Status New    Target Date 10/09/20      PT LONG TERM GOAL #4    Title Patient will increase Berg Balance score by > 4 points to demonstrate decreased fall risk during functional activities.    Baseline 07/17/2020- BERG = 49/56    Time 12    Period Weeks    Status New    Target Date 10/09/20  PT LONG TERM GOAL #5   Title Patient will increase 10 meter walk test to >1.3m/s as to improve gait speed for better community ambulation and to reduce fall risk.    Baseline 07/17/2020= 10MWT avg=11.0 sec (0.91 m/s) without an AD    Time 12    Period Weeks    Status New    Target Date 10/09/20                 Plan - 07/26/20 2059    Clinical Impression Statement Patient performed well overall today with new instruction for core and posterior LE strengthening. She may present with SI discomfort due to rotated inominate so focusing on muscle energy strategies today with patient reporting no pain. She will continue to benefit from skilled PT services to improve her overall pain and progress toward safe mobility in home and community with decreased risk of falling.    Personal Factors and Comorbidities Age;Comorbidity 3+    Comorbidities Osteoporosis, HTN, CHF    Examination-Activity Limitations Bend;Caring for Others;Carry;Lift;Reach Overhead;Sit;Squat;Stairs;Stand    Examination-Participation Restrictions Cleaning;Community Activity;Laundry;Shop;Yard Work    Stability/Clinical Decision Making Stable/Uncomplicated    Rehab Potential Good    PT Frequency 2x / week    PT Duration 12 weeks    PT Treatment/Interventions ADLs/Self Care Home Management;Cryotherapy;Moist Heat;DME Instruction;Stair training;Functional mobility training;Therapeutic activities;Therapeutic exercise;Balance training;Neuromuscular re-education;Patient/family education;Manual techniques    PT Next Visit Plan LE strengthening, Low back stretching, manual therapy for pain relief,  balance training.    PT Home Exercise Plan Added core/LE strengthening exercises to home program -  issued handout    Consulted and Agree with Plan of Care Patient           Patient will benefit from skilled therapeutic intervention in order to improve the following deficits and impairments:  Abnormal gait,Decreased balance,Decreased coordination,Decreased activity tolerance,Cardiopulmonary status limiting activity,Decreased endurance,Decreased mobility,Decreased strength,Difficulty walking,Pain  Visit Diagnosis: Difficulty in walking, not elsewhere classified  Unsteadiness on feet  Muscle weakness (generalized)  Other abnormalities of gait and mobility     Problem List Patient Active Problem List   Diagnosis Date Noted  . Lumbar facet joint syndrome 06/18/2020  . Spondylosis without myelopathy or radiculopathy, lumbosacral region 06/18/2020  . DDD (degenerative disc disease), lumbosacral 06/18/2020  . COVID 06/04/2020  . Chronic pain syndrome 05/16/2020  . Pharmacologic therapy 05/16/2020  . Disorder of skeletal system 05/16/2020  . Problems influencing health status 05/16/2020  . 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 Chronic hip pain (2ry area of Pain) (Bilateral) (R>L) 05/16/2020  . Chronic lower extremity pain (Left) 05/16/2020  . Numbness and tingling of both feet (intermittent/recurrent) 05/16/2020  . Pain in rib (Bilateral) (R>L) (intermittent) 05/16/2020  . Chronic knee pain (Left) 05/16/2020  . Patellar pain (Left) (chronic, intermittent) 05/16/2020  . Hard of hearing 05/16/2020  . Carcinoma, lung, right ([place]) 06/25/2019  . S/P partial lobectomy of lung 02/25/2019  . Mass of upper lobe of right lung 01/06/2019  . Chronic nonintractable headache 06/14/2018  . Head ache 12/03/2017  . Sinus bradycardia 10/06/2017  . Chronic low back pain (1ry area of Pain) (Bilateral) (L>R) w/o sciatica 08/17/2017  . Panic attack as reaction to stress 08/17/2017  . Anxiety 06/15/2017  . Atypical chest pain 06/15/2017  . Hyperlipidemia 06/15/2017  . Cholecystitis with  cholelithiasis 02/27/2016  . Increased frequency of urination 01/27/2015  . History of night sweats 05/01/2014  . Dupuytren's contracture 07/18/2013  . Inverted nipple 04/18/2013  . Depression 04/23/2010  .  Fibromyalgia 04/23/2010  . Hypercholesterolemia 04/23/2010  . Essential hypertension 04/23/2010  . Hypothyroidism 04/23/2010  . Irritable colon 04/23/2010  . Osteoarthritis 04/23/2010    Lewis Moccasin, PT 07/27/2020, 7:55 AM  [place] MAIN [facility] SERVICES [address] [place], [place], [address] Phone: [phone]   Fax:  [phone]  Name: Kendra Benton MRN: [id number] Date of Birth: July 03, 1940

## 2020-07-31 ENCOUNTER — Other Ambulatory Visit: Payer: Self-pay

## 2020-07-31 ENCOUNTER — Ambulatory Visit: Payer: Medicare Other

## 2020-07-31 DIAGNOSIS — R262 Difficulty in walking, not elsewhere classified: Secondary | ICD-10-CM | POA: Diagnosis not present

## 2020-07-31 DIAGNOSIS — R2681 Unsteadiness on feet: Secondary | ICD-10-CM

## 2020-07-31 DIAGNOSIS — M6281 Muscle weakness (generalized): Secondary | ICD-10-CM

## 2020-07-31 DIAGNOSIS — R2689 Other abnormalities of gait and mobility: Secondary | ICD-10-CM

## 2020-07-31 NOTE — Therapy (Signed)
[place] MAIN [facility] SERVICES [address] [place], [place], [address] Phone: [phone]   Fax:  [phone]  Physical Therapy Treatment  Patient Details  Name: Kendra Benton MRN: [id number] Date of Birth: 08/30/1940 Referring Provider (PT): Dr. Sharia Reeve   Encounter Date: 07/31/2020   PT End of Session - 07/31/20 1208    Visit Number 5    Number of Visits 25    Date for PT Re-Evaluation 10/09/20    Authorization Type Initial PT Evaluation on 07/17/2020    Authorization Time Period 07/17/2020- 10/09/2020    PT Start Time 1200    PT Stop Time 1230    PT Time Calculation (min) 30 min    Equipment Utilized During Treatment Gait belt    Activity Tolerance Patient tolerated treatment well;No increased pain    Behavior During Therapy WFL for tasks assessed/performed           Past Medical History:  Diagnosis Date  . Allergy   . Anxiety   . GERD (gastroesophageal reflux disease)   . Headache   . History of kidney stones   . Hypertension   . Myocardial infarction ([place])    1997  . Osteoporosis   . Pneumonia     Past Surgical History:  Procedure Laterality Date  . ABDOMINAL HYSTERECTOMY     patient has ovaries  . APPENDECTOMY    . ARTERY BIOPSY Right 12/30/2017   Procedure: BIOPSY TEMPORAL ARTERY;  Surgeon: Katha Cabal, MD;  Location: [facility] OR[facility];  Service: Vascular;  Laterality: Right;  . ARTERY BIOPSY Left 06/18/2018   Procedure: BIOPSY TEMPORAL ARTERY;  Surgeon: Katha Cabal, MD;  Location: [facility];  Service: Vascular;  Laterality: Left;  . BACK SURGERY    . CARDIAC CATHETERIZATION    . CATARACT EXTRACTION W/ INTRAOCULAR LENS  IMPLANT, BILATERAL Bilateral 2010  . CHOLECYSTECTOMY N/A 02/28/2016   Procedure: LAPAROSCOPIC CHOLECYSTECTOMY WITH INTRAOPERATIVE CHOLANGIOGRAM;  Surgeon: Dia Crawford III, MD;  Location: [facility] OR[facility];  Service: General;  Laterality: N/A;  . LUMBAR FUSION  11/09/2016   L3-L5  . SPINE SURGERY     Disc   . TONSILLECTOMY      There were no vitals filed for this visit.   Subjective Assessment - 07/31/20 1205    Subjective Patient reports falling at home on Sunday - bringing groceries into home  and fell onto right knee but was cushioned by loaf of bread. States she is sore and was also sore after doing the bridge exercise so has not attempted that exercise again.    Pertinent History Patient reports history of chronic LBP with past lumbar fusion surgery on 11/09/2016 and most recent lumbar facet joint block procedure on 06/19/2020. She reports past medical history of spondylosis, Lung Cancer, HTN. She reports increased difficulty performing ADL's yet still independent but has paid services who assist with cleaning.    Limitations Standing;Lifting;Walking;House hold activities    How long can you sit comfortably? no restrictions    How long can you stand comfortably? <30 min    How long can you walk comfortably? <10 min    Patient Stated Goals Improve my pain and walk better with no falls.    Currently in Pain? Yes    Pain Score 2     Pain Location Buttocks    Pain Orientation Right    Pain Descriptors / Indicators Aching    Pain Type Chronic pain    Pain Onset More than a  month ago    Pain Frequency Intermittent    Aggravating Factors  Prolonged standing/walking    Multiple Pain Sites No           Attempted manual stretching/Low back PIVM - tender throughout- Normal Lumbar AROM flex/ext with no provocation of symptoms other than tightness.   Therex:     Reviewed and performed Transverse abdominal contraction with towel roll x 5 sec x 10 reps. Added TA contraction while alt leg lift (foot 6" off mat) x 10. Patient able to perform well with no increase in pain. TA contraction- Alt UE/LE lift (dead bug) x 15 reps each TA contraction with Hip ABD with GTB x 12 reps each .  Patient denied any increase in symptoms during sessions.   Pt educated throughout session about proper  posture and technique with exercises. Improved exercise technique, movement at target joints, use of target muscles after min to mod verbal, visual, tactile cues.                           PT Education - 08/01/20 1226    Education Details Fall prevention education, specific exercise training    Person(s) Educated Patient    Methods Explanation;Demonstration;Tactile cues;Verbal cues    Comprehension Verbalized understanding;Returned demonstration;Verbal cues required;Tactile cues required;Need further instruction            PT Short Term Goals - 07/17/20 2123      PT SHORT TERM GOAL #1   Title Patient will be independent in home exercise program to improve pain relief/ strength/mobility for better functional independence with ADLs.    Baseline 07/17/2020- Patient doesn't have any formal HEP in place.    Time 6    Period Weeks    Status New    Target Date 08/28/20             PT Long Term Goals - 07/17/20 2125      PT LONG TERM GOAL #1   Title Patient will increase FOTO score to equal to or greater than 63 to demonstrate statistically significant improvement in mobility and quality of life.    Baseline 07/17/2020: FOTO score=60    Time 12    Period Weeks    Status New    Target Date 10/09/20      PT LONG TERM GOAL #2   Title Pt will decrease worst back pain as reported on NPRS by at least 2 points in order to demonstrate clinically significant reduction in back pain.    Baseline 07/17/2020- 5/10 LBP at worst.    Time 12    Period Weeks    Status New    Target Date 10/09/20      PT LONG TERM GOAL #3   Title Patient (> 18 years old) will complete five times sit to stand test in < 18 seconds indicating an increased LE strength and improved balance.    Baseline 07/17/2020- 26.02 sec    Time 12    Period Weeks    Status New    Target Date 10/09/20      PT LONG TERM GOAL #4   Title Patient will increase Berg Balance score by > 4 points to  demonstrate decreased fall risk during functional activities.    Baseline 07/17/2020- BERG = 49/56    Time 12    Period Weeks    Status New    Target Date 10/09/20      PT LONG  TERM GOAL #5   Title Patient will increase 10 meter walk test to >1.73m/s as to improve gait speed for better community ambulation and to reduce fall risk.    Baseline 07/17/2020= 10MWT avg=11.0 sec (0.91 m/s) without an AD    Time 12    Period Weeks    Status New    Target Date 10/09/20                 Plan - 08/01/20 1228    Clinical Impression Statement Treatment limited secondary to patient presenting with increased low back soreness. She was point tender throughout low back and treatment focused on core stabilization in pain free motion. She will continue to benefit from skilled PT services to improve her overall pain and progress toward safe mobility in home and community with decreased risk of falling.    Personal Factors and Comorbidities Age;Comorbidity 3+    Comorbidities Osteoporosis, HTN, CHF    Examination-Activity Limitations [place];Caring for Others;Carry;Lift;Reach Overhead;Sit;Squat;Stairs;Stand    Examination-Participation Restrictions Cleaning;Community Activity;Laundry;Shop;Yard Work    Stability/Clinical Decision Making Stable/Uncomplicated    Rehab Potential Good    PT Frequency 2x / week    PT Duration 12 weeks    PT Treatment/Interventions ADLs/Self Care Home Management;Cryotherapy;Moist Heat;DME Instruction;Stair training;Functional mobility training;Therapeutic activities;Therapeutic exercise;Balance training;Neuromuscular re-education;Patient/family education;Manual techniques    PT Next Visit Plan LE strengthening, Low back stretching, manual therapy for pain relief,  balance training.    Consulted and Agree with Plan of Care Patient           Patient will benefit from skilled therapeutic intervention in order to improve the following deficits and impairments:  Abnormal  gait,Decreased balance,Decreased coordination,Decreased activity tolerance,Cardiopulmonary status limiting activity,Decreased endurance,Decreased mobility,Decreased strength,Difficulty walking,Pain  Visit Diagnosis: Difficulty in walking, not elsewhere classified  Unsteadiness on feet  Muscle weakness (generalized)  Other abnormalities of gait and mobility     Problem List Patient Active Problem List   Diagnosis Date Noted  . Lumbar facet joint syndrome 06/18/2020  . Spondylosis without myelopathy or radiculopathy, lumbosacral region 06/18/2020  . DDD (degenerative disc disease), lumbosacral 06/18/2020  . COVID 06/04/2020  . Chronic pain syndrome 05/16/2020  . Pharmacologic therapy 05/16/2020  . Disorder of skeletal system 05/16/2020  . Problems influencing health status 05/16/2020  . 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 Chronic hip pain (2ry area of Pain) (Bilateral) (R>L) 05/16/2020  . Chronic lower extremity pain (Left) 05/16/2020  . Numbness and tingling of both feet (intermittent/recurrent) 05/16/2020  . Pain in rib (Bilateral) (R>L) (intermittent) 05/16/2020  . Chronic knee pain (Left) 05/16/2020  . Patellar pain (Left) (chronic, intermittent) 05/16/2020  . Hard of hearing 05/16/2020  . 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 S/P partial lobectomy of lung 02/25/2019  . Mass of upper lobe of right lung 01/06/2019  . Chronic nonintractable headache 06/14/2018  . Head ache 12/03/2017  . Sinus bradycardia 10/06/2017  . Chronic low back pain (1ry area of Pain) (Bilateral) (L>R) w/o sciatica 08/17/2017  . Panic attack as reaction to stress 08/17/2017  . Anxiety 06/15/2017  . Atypical chest pain 06/15/2017  . Hyperlipidemia 06/15/2017  . Cholecystitis with cholelithiasis 02/27/2016  . Increased frequency of urination 01/27/2015  . History of night sweats 05/01/2014  . Dupuytren's contracture 07/18/2013  . Inverted nipple 04/18/2013  . Depression 04/23/2010   . Fibromyalgia 04/23/2010  . Hypercholesterolemia 04/23/2010  . Essential hypertension 04/23/2010  . Hypothyroidism 04/23/2010  . Irritable colon 04/23/2010  . Osteoarthritis 04/23/2010  Lewis Moccasin, PT 08/01/2020, 12:38 PM  [place] MAIN [facility] SERVICES [address] [place], [place], [address] Phone: [phone]   Fax:  [phone]  Name: Kendra Benton MRN: [id number] Date of Birth: 02-19-1941

## 2020-08-02 ENCOUNTER — Ambulatory Visit: Payer: Medicare Other

## 2020-08-05 NOTE — Progress Notes (Signed)
PROVIDER NOTE: Information contained herein reflects review and annotations entered in association with encounter. Interpretation of such information and data should be left to medically-trained personnel. Information provided to patient can be located elsewhere in the medical record under "Patient Instructions". Document created using STT-dictation technology, any transcriptional errors that may result from process are unintentional.    Patient: Kendra Kendra Benton  Service Category: E/M  Provider: Gaspar Cola, MD  DOB: [birth year]  DOS: 08/06/2020  Specialty: Interventional Pain Management  MRN: [id number]  Setting: Ambulatory outpatient  PCP: Kendra Barnacle, MD  Type: Established Patient    Referring Provider: Jacklynn Kendra Benton  Location: Office  Delivery: Face-to-face     HPI  Ms. Kendra Kendra Benton, a 80 y.o. year old female, is here today because of her Chronic bilateral low back pain without sciatica [M54.50, G89.29]. Ms. Kendra Kendra Benton primary complain today is Back Pain (Back pain) Last encounter: My last encounter with her was on 07/03/2020. Pertinent problems: Ms. Kendra Kendra Benton has Dupuytren's contracture; Fibromyalgia; History of night sweats; Osteoarthritis; Head ache; Atypical chest pain; Carcinoma, lung, right ([place]); Chronic low back pain (1ry area of Pain) (Bilateral) (L>R) w/o sciatica; Mass of upper lobe of right lung; Chronic nonintractable headache; Chronic pain syndrome; Failed back surgical syndrome; Chronic hip pain (2ry area of Pain) (Bilateral) (R>L); Chronic lower extremity pain (Left); Numbness and tingling of both feet (intermittent/recurrent); Pain in rib (Bilateral) (R>L) (intermittent); Chronic knee pain (Left); Patellar pain (Left) (chronic, intermittent); Lumbar facet joint syndrome; Spondylosis without myelopathy or radiculopathy, lumbosacral region; DDD (degenerative disc disease), lumbosacral; and Fall (on) (from) other stairs and steps, initial encounter on their  pertinent problem list. Pain Assessment: Severity of Chronic pain is reported as a 4 /10. Location: Back  /pain radiaties down to her hips, left foot become numb. Onset: More than a month ago. Quality: Aching,Burning,Numbness,Constant. Timing: Constant. Modifying factor(s): meds and heating pad. Vitals:  height is _0  (1.575 m) and weight is 125 lb (56.7 kg). Her temporal temperature is 97.2 F (36.2 C) (abnormal). Her blood pressure is 152/68 (abnormal) and her pulse is 71. Her respiration is 16 and oxygen saturation is 94%.   Reason for encounter: medication management.   The patient indicates doing well with the current medication regimen. No adverse reactions or side effects reported to the medications.  The patient indicates having experienced a fall returning home from getting some groceries.  Apparently she fell at the very first step of her stairs.  She denies having had any x-rays done and she also indicates that it was not until approximately 3 days later that the pain started to flareup on her lower back.  She refers that this pain is across the lower back but more so on the left side.  She also has Kendra Benton experiencing some pain that is going down to the top of her foot with intermittent numbness of the entire foot.  This seems to be happening over the L5 dermatomal distribution.  She is 80 years old and therefore I will go ahead and order an x-ray to make sure that she has not fractured a vertebral body.  She indicated that she was doing physical therapy and thought it was helping until this happened.  She attempted to go back to physical therapy but was unable to tolerate it and she was sent home by the physical therapist for her to rest.  They also cancel the next appointment, for the same reason.  She states that she is planning on going  to the physical therapist tomorrow to see if she can continue with the physical therapy.  However, she still having the pain and therefore I will go ahead and  send a prescription to her pharmacy for a steroid taper.  She denies any diabetes or any contraindications to the use of that steroid.  She asked me if she could continue taking the tramadol which I indicated it would be fine to take both.  She also indicated not having any problems with her tramadol and therefore I have sent a refill to her pharmacy.  I will schedule an appointment to talk to her on a virtual visit in approximately 2 weeks from now to see how she did with the steroid taper and to go over the results of her x-rays of the lumbar spine.  She was informed of the plan and she has agreed to it.  If the steroids do not do the trick, then we will consider bringing him back for diagnostic bilateral lumbar facet blocks.  RTCB: 11/15/2020  Pharmacotherapy Assessment   Analgesic: None MME/day: 0 mg/day   Monitoring: [place] PMP: PDMP reviewed during this encounter.       Pharmacotherapy: No side-effects or adverse reactions reported. Compliance: No problems identified. Effectiveness: Clinically acceptable.  Kendra Fischer, RN  08/06/2020 11:20 AM  Sign when Signing Visit Nursing Pain Medication Assessment:  Safety precautions to be maintained throughout the outpatient stay will include: orient to surroundings, keep bed in low position, maintain call bell within reach at all times, provide assistance with transfer out of bed and ambulation.  Medication Inspection Compliance: Pill count conducted under aseptic conditions, in front of the patient. Neither the pills nor the bottle was removed from the patient's sight at any time. Once count was completed pills were immediately returned to the patient in their original bottle.  Medication: Tramadol (Ultram) Pill/Patch Count: 15 of 60 pills remain Pill/Patch Appearance: Kendra Kendra Benton consistent with prescribed medication Bottle Appearance: Standard pharmacy container. Clearly labeled. Filled Date: 1 / 10 / 22 Last Medication intake:  TodaySafety  precautions to be maintained throughout the outpatient stay will include: orient to surroundings, keep bed in low position, maintain call bell within reach at all times, provide assistance with transfer out of bed and ambulation.     UDS:  Summary  Date Value Ref Range Status  07/03/2020 Note  Final    Comment:    ==================================================================== [employer] [facility] (MW) ==================================================================== Test                             Result       Flag       Units  Drug Present and Declared for Prescription Verification   Oxazepam                       3005         EXPECTED   ng/mg creat   Temazepam                      >9524        EXPECTED   ng/mg creat    Oxazepam and temazepam are expected metabolites of diazepam.    Oxazepam is also an expected metabolite of other benzodiazepine    drugs, including chlordiazepoxide, prazepam, clorazepate, halazepam,    and temazepam.  Oxazepam and temazepam are available as scheduled    prescription medications.  Tramadol                       20467        EXPECTED   ng/mg creat   O-Desmethyltramadol            8262         EXPECTED   ng/mg creat   N-Desmethyltramadol            6733         EXPECTED   ng/mg creat    Source of tramadol is a prescription medication. O-desmethyltramadol    and N-desmethyltramadol are expected metabolites of tramadol.  ==================================================================== Test                      Result    Flag   Units      Ref Range   Creatinine              21               mg/dL      >=20 ==================================================================== Declared Medications:  The flagging and interpretation on this report are based on the  following declared medications.  Unexpected results may arise from  inaccuracies in the declared medications.   **Note: The testing scope of this panel includes these  medications:   Temazepam (Restoril)  Tramadol (Ultram)   **Note: The testing scope of this panel does not include the  following reported medications:   Acetaminophen (Tylenol)  Albuterol  Atorvastatin (Lipitor)  Calcium  Dicyclomine (Bentyl)  Duloxetine (Cymbalta)  Estradiol (Estrace)  Eye Drops  Furosemide (Lasix)  Gabapentin (Neurontin)  Lisinopril (Zestril)  Loperamide (Imodium)  Losartan (Cozaar)  Naproxen (Aleve)  Omeprazole (Prilosec)  Oxcarbazepine (Trileptal)  Potassium (Klor-Con)  Turmeric  Vitamin D ==================================================================== For clinical consultation, please call [phone]. ====================================================================      ROS  Constitutional: Denies any fever or chills Gastrointestinal: No reported hemesis, hematochezia, vomiting, or acute GI distress Musculoskeletal: Denies any acute onset joint swelling, redness, loss of ROM, or weakness Neurological: No reported episodes of acute onset apraxia, aphasia, dysarthria, agnosia, amnesia, paralysis, loss of coordination, or loss of consciousness  Medication Review  Albuterol, Carboxymethylcellulose Sodium, DULoxetine, OXcarbazepine, Turmeric, acetaminophen, atorvastatin, calcium-vitamin D, dicyclomine, estradiol, furosemide, gabapentin, lisinopril, loperamide, losartan, naproxen sodium, omeprazole, polycarbophil, potassium chloride SA, predniSONE, temazepam, and traMADol  History Review  Allergy: Ms. Griffey is allergic to spironolactone, hydrochlorothiazide, ciprofloxacin, hydralazine, levofloxacin, amlodipine, and latex. Drug: Ms. Silvernail  reports no history of drug use. Alcohol:  reports current alcohol use of about 10.0 standard drinks of alcohol per week. Tobacco:  reports that she quit smoking about 42 years ago. Her smoking use included cigarettes. She has a 20.00 pack-year smoking history. She has never used smokeless  tobacco. Social: Ms. Cranor  reports that she quit smoking about 42 years ago. Her smoking use included cigarettes. She has a 20.00 pack-year smoking history. She has never used smokeless tobacco. She reports current alcohol use of about 10.0 standard drinks of alcohol per week. She reports that she does not use drugs. Medical:  has a past medical history of Allergy, Anxiety, GERD (gastroesophageal reflux disease), Headache, History of kidney stones, Hypertension, Myocardial infarction ([place]), Osteoporosis, and Pneumonia. Surgical: Ms. Geisinger  has a past surgical history that includes Cholecystectomy (N/A, 02/28/2016); Spine surgery; Abdominal hysterectomy; Appendectomy; Lumbar fusion (11/09/2016); Cardiac catheterization; Back surgery; Tonsillectomy; Cataract extraction w/ intraocular lens  implant, bilateral (Bilateral, 2010); Artery  Biopsy (Right, 12/30/2017); and Artery Biopsy (Left, 06/18/2018). Family: family history includes Arthritis in her mother; Cancer (age of onset: 27) in her sister; Cancer (age of onset: 42) in her father and mother; Heart disease in her father and mother.  Laboratory Chemistry Profile   Renal Lab Results  Component Value Date   BUN 20 05/16/2020   CREATININE 0.81 05/16/2020   BCR 25 05/16/2020   GFRAA 79 05/16/2020   GFRNONAA 68 05/16/2020     Hepatic Lab Results  Component Value Date   AST 16 05/16/2020   ALT 17 08/27/2018   ALBUMIN 4.1 05/16/2020   ALKPHOS 86 05/16/2020   LIPASE 32 08/27/2018     Electrolytes Lab Results  Component Value Date   NA 135 05/16/2020   K 4.6 05/16/2020   CL 93 (L) 05/16/2020   CALCIUM 9.4 05/16/2020   MG 1.7 05/16/2020     Bone Lab Results  Component Value Date   25OHVITD1 35 05/16/2020   25OHVITD2 <1.0 05/16/2020   25OHVITD3 35 05/16/2020     Inflammation (CRP: Acute Phase) (ESR: Chronic Phase) Lab Results  Component Value Date   CRP 1 05/16/2020   ESRSEDRATE 2 05/16/2020   LATICACIDVEN 1.5 02/27/2016        Note: Above Lab results reviewed.  Recent Imaging Review  DG PAIN CLINIC C-ARM 1-60 MIN NO REPORT Fluoro was used, but no Radiologist interpretation will be provided.  Please refer to "NOTES" tab for provider progress note. Note: Reviewed        Physical Exam  General appearance: Well nourished, well developed, and well hydrated. In no apparent acute distress Mental status: Alert, oriented x 3 (person, place, & time)       Respiratory: No evidence of acute respiratory distress Eyes: PERLA Vitals: BP (!) 152/68 (BP Location: Left Arm, Patient Position: Sitting, Cuff Size: Large)   Pulse 71   Temp (!) 97.2 F (36.2 C) (Temporal)   Resp 16   Ht _0  (1.575 m)   Wt 125 lb (56.7 kg)   SpO2 94%   BMI 22.86 kg/m  BMI: Estimated body mass index is 22.86 kg/m as calculated from the following:   Height as of this encounter: _1  (1.575 m).   Weight as of this encounter: 125 lb (56.7 kg). Ideal: Ideal body weight: 50.1 kg (110 lb 7.2 oz) Adjusted ideal body weight: 52.7 kg (116 lb 4.3 oz)  Assessment   Status Diagnosis  Controlled Controlled Controlled 1. Chronic low back pain (1ry area of Pain) (Bilateral) (L>R) w/o sciatica   2. Chronic pain syndrome   3. Lumbar facet joint syndrome   4. Chronic hip pain (2ry area of Pain) (Bilateral) (R>L)   5. Chronic lower extremity pain (Left)   6. Pharmacologic therapy   7. Uncomplicated opioid dependence ([place])   8. Fall (on) (from) other stairs and steps, initial encounter      Updated Problems: Problem  Fall (On) (From) Other Stairs and Steps, Initial Encounter    Plan of Care  Problem-specific:  No problem-specific Assessment & Plan notes found for this encounter.  Ms. Kendra Kendra Benton has a current medication list which includes the following long-term medication(s): albuterol, atorvastatin, calcium-vitamin d, dicyclomine, duloxetine, duloxetine, estradiol, furosemide, losartan, omeprazole, oxcarbazepine, temazepam,  lisinopril, and [START ON 08/17/2020] tramadol.  Pharmacotherapy (Medications Ordered): Meds ordered this encounter  Medications  . traMADol (ULTRAM) 50 MG tablet    Sig: Take 1 tablet (50 mg total) by mouth 2 (two)  times daily as needed for severe pain. Must last 30 days    Dispense:  60 tablet    Refill:  2    Chronic Pain: STOP Act (Not applicable) Fill 1 day early if closed on refill date. Avoid benzodiazepines within 8 hours of opioids  . predniSONE (DELTASONE) 20 MG tablet    Sig: Take 3 tablets (60 mg total) by mouth daily with breakfast for 3 days, THEN 2 tablets (40 mg total) daily with breakfast for 3 days, THEN 1 tablet (20 mg total) daily with breakfast for 3 days.    Dispense:  18 tablet    Refill:  0   Orders:  Orders Placed This Encounter  Procedures  . DG Lumbar Spine Complete W/Bend    Patient presents with axial pain with possible radicular component.  In addition to any acute findings, please report on:  1. Facet (Zygapophyseal) joint DJD (Hypertrophy, space narrowing, subchondral sclerosis, and/or osteophyte formation) 2. DDD and/or IVDD (Loss of disc height, desiccation or "Black disc disease") 3. Pars defects 4. Spondylolisthesis, spondylosis, and/or spondyloarthropathies (include Degree/Grade of displacement in mm) 5. Vertebral body Fractures, including age (old, new/acute) 60. Modic Type Changes 7. Demineralization 8. Bone pathology 9. Central, Lateral Recess, and/or Foraminal Stenosis (include AP diameter of stenosis in mm) 10. Surgical changes (hardware type, status, and presence of fibrosis)  NOTE: Please specify level(s) and laterality. If applicable: Please indicate ROM and/or evidence of instability (>3m displacement between flexion and extension views)    Standing Status:   Future    Standing Expiration Date:   09/03/2020    Scheduling Instructions:     Imaging must be done as soon as possible. Inform patient that order will expire within 30 days and  I will not renew it.    Order Specific Question:   Reason for Exam (SYMPTOM  OR DIAGNOSIS REQUIRED)    Answer:   Low back pain    Order Specific Question:   Preferred imaging location?    Answer:   [facility]    Order Specific Question:   Call Results- Best Contact Number?    Answer:   [phone]([facility]    Order Specific Question:   Radiology Contrast Protocol - do NOT remove file path    Answer:   [email or web address]    Order Specific Question:   Release to patient    Answer:   Immediate   Follow-up plan:   Return in about 3 months (around 11/15/2020) for (F2F), (Med Mgmt) .      Interventional Therapies  Risk  Complexity Considerations:   Advanced age  Latex allergy  For candidate for Lumbar RF secondary to hardware     Planned  Pending:      Under consideration:   Possible diagnostic midline caudal ESI + diagnostic epidurogram #1  Diagnostic bilateral IA hip joint injection  Diagnostic left IA knee injection    Completed:   Diagnostic bilateral lumbar facet MBB x1 (06/19/2020)   Therapeutic  Palliative (PRN) options:   None established      Recent Visits Date Type Provider Dept  07/03/20 Office Visit NMilinda Pointer MD [facility]  06/19/20 Procedure visit NMilinda Pointer MD [facility]  06/18/20 [facility] 05/16/20 Office Visit NMilinda Pointer MD [facility]  Showing recent visits within past 90 days and meeting all other requirements Today's Visits Date Type Provider Dept  08/06/20 Office Visit NMilinda Pointer MD [facility]  Showing today's visits and meeting all other requirements Future Appointments No visits were found meeting these conditions. Showing future appointments within next 90 days and meeting all other requirements  I discussed the assessment and treatment plan with the patient. The  patient was provided an opportunity to ask questions and all were answered. The patient agreed with the plan and demonstrated an understanding of the instructions.  Patient advised to call back or seek an in-person evaluation if the symptoms or condition worsens.  Duration of encounter: 30 minutes.  Note by: Kendra Cola, MD Date: 08/06/2020; Time: 11:57 AM

## 2020-08-06 ENCOUNTER — Other Ambulatory Visit: Payer: Self-pay

## 2020-08-06 ENCOUNTER — Encounter: Payer: Self-pay | Admitting: Pain Medicine

## 2020-08-06 ENCOUNTER — Ambulatory Visit: Payer: Medicare Other | Attending: Pain Medicine | Admitting: Pain Medicine

## 2020-08-06 VITALS — BP 152/68 | HR 71 | Temp 97.2°F | Resp 16 | Ht 62.0 in | Wt 125.0 lb

## 2020-08-06 DIAGNOSIS — G894 Chronic pain syndrome: Secondary | ICD-10-CM

## 2020-08-06 DIAGNOSIS — M25552 Pain in left hip: Secondary | ICD-10-CM | POA: Insufficient documentation

## 2020-08-06 DIAGNOSIS — M79605 Pain in left leg: Secondary | ICD-10-CM | POA: Insufficient documentation

## 2020-08-06 DIAGNOSIS — M47816 Spondylosis without myelopathy or radiculopathy, lumbar region: Secondary | ICD-10-CM | POA: Diagnosis not present

## 2020-08-06 DIAGNOSIS — M545 Low back pain, unspecified: Secondary | ICD-10-CM | POA: Diagnosis not present

## 2020-08-06 DIAGNOSIS — W108XXA Fall (on) (from) other stairs and steps, initial encounter: Secondary | ICD-10-CM | POA: Insufficient documentation

## 2020-08-06 DIAGNOSIS — Z79899 Other long term (current) drug therapy: Secondary | ICD-10-CM

## 2020-08-06 DIAGNOSIS — M25551 Pain in right hip: Secondary | ICD-10-CM | POA: Diagnosis not present

## 2020-08-06 DIAGNOSIS — G8929 Other chronic pain: Secondary | ICD-10-CM | POA: Diagnosis present

## 2020-08-06 DIAGNOSIS — F1120 Opioid dependence, uncomplicated: Secondary | ICD-10-CM | POA: Diagnosis not present

## 2020-08-06 MED ORDER — TRAMADOL HCL 50 MG PO TABS: 50.0000 mg | ORAL_TABLET | Freq: Two times a day (BID) | ORAL | 2 refills | Status: DC | PRN

## 2020-08-06 MED ORDER — PREDNISONE 20 MG PO TABS: ORAL_TABLET | ORAL | 0 refills | Status: AC

## 2020-08-06 NOTE — Progress Notes (Signed)
Nursing Pain Medication Assessment:  Safety precautions to be maintained throughout the outpatient stay will include: orient to surroundings, keep bed in low position, maintain call bell within reach at all times, provide assistance with transfer out of bed and ambulation.  Medication Inspection Compliance: Pill count conducted under aseptic conditions, in front of the patient. Neither the pills nor the bottle was removed from the patient's sight at any time. Once count was completed pills were immediately returned to the patient in their original bottle.  Medication: Tramadol (Ultram) Pill/Patch Count: 15 of 60 pills remain Pill/Patch Appearance: Markings consistent with prescribed medication Bottle Appearance: Standard pharmacy container. Clearly labeled. Filled Date: 1 / 10 / 22 Last Medication intake:  TodaySafety precautions to be maintained throughout the outpatient stay will include: orient to surroundings, keep bed in low position, maintain call bell within reach at all times, provide assistance with transfer out of bed and ambulation.

## 2020-08-06 NOTE — Patient Instructions (Signed)
____________________________________________________________________________________________  Medication Rules  Purpose: To inform patients, and their family members, of our rules and regulations.  Applies to: All patients receiving prescriptions (written or electronic).  Pharmacy of record: Pharmacy where electronic prescriptions will be sent. If written prescriptions are taken to a different pharmacy, please inform the nursing staff. The pharmacy listed in the electronic medical record should be the one where you would like electronic prescriptions to be sent.  Electronic prescriptions: In compliance with the [place] Strengthen Opioid Misuse Prevention (STOP) Act of 2017 (Session Law 2017-74/H243), effective June 09, 2018, all controlled substances must be electronically prescribed. Calling prescriptions to the pharmacy will cease to exist.  Prescription refills: Only during scheduled appointments. Applies to all prescriptions.  NOTE: The following applies primarily to controlled substances (Opioid* Pain Medications).   Type of encounter (visit): For patients receiving controlled substances, face-to-face visits are required. (Not an option or up to the patient.)  Patient's responsibilities: 1. Pain Pills: Bring all pain pills to every appointment (except for procedure appointments). 2. Pill Bottles: Bring pills in original pharmacy bottle. Always bring the newest bottle. Bring bottle, even if empty. 3. Medication refills: You are responsible for knowing and keeping track of what medications you take and those you need refilled. The day before your appointment: write a list of all prescriptions that need to be refilled. The day of the appointment: give the list to the admitting nurse. Prescriptions will be written only during appointments. No prescriptions will be written on procedure days. If you forget a medication: it will not be "Called in", "Faxed", or "electronically sent".  You will need to get another appointment to get these prescribed. No early refills. Do not call asking to have your prescription filled early. 4. Prescription Accuracy: You are responsible for carefully inspecting your prescriptions before leaving our office. Have the discharge nurse carefully go over each prescription with you, before taking them home. Make sure that your name is accurately spelled, that your address is correct. Check the name and dose of your medication to make sure it is accurate. Check the number of pills, and the written instructions to make sure they are clear and accurate. Make sure that you are given enough medication to last until your next medication refill appointment. 5. Taking Medication: Take medication as prescribed. When it comes to controlled substances, taking less pills or less frequently than prescribed is permitted and encouraged. Never take more pills than instructed. Never take medication more frequently than prescribed.  6. Inform other Doctors: Always inform, all of your healthcare providers, of all the medications you take. 7. Pain Medication from other Providers: You are not allowed to accept any additional pain medication from any other Doctor or Healthcare provider. There are two exceptions to this rule. (see below) In the event that you require additional pain medication, you are responsible for notifying us, as stated below. 8. Cough Medicine: Often these contain an opioid, such as codeine or hydrocodone. Never accept or take cough medicine containing these opioids if you are already taking an opioid* medication. The combination may cause respiratory failure and death. 9. Medication Agreement: You are responsible for carefully reading and following our Medication Agreement. This must be signed before receiving any prescriptions from our practice. Safely store a copy of your signed Agreement. Violations to the Agreement will result in no further prescriptions.  (Additional copies of our Medication Agreement are available upon request.) 10. Laws, Rules, & Regulations: All patients are expected to follow all   Federal and State Laws, Statutes, Rules, & Regulations. Ignorance of the Laws does not constitute a valid excuse.  11. Illegal drugs and Controlled Substances: The use of illegal substances (including, but not limited to marijuana and its derivatives) and/or the illegal use of any controlled substances is strictly prohibited. Violation of this rule may result in the immediate and permanent discontinuation of any and all prescriptions being written by our practice. The use of any illegal substances is prohibited. 12. Adopted CDC guidelines & recommendations: Target dosing levels will be at or below 60 MME/day. Use of benzodiazepines** is not recommended.  Exceptions: There are only two exceptions to the rule of not receiving pain medications from other Healthcare Providers. 1. Exception #1 (Emergencies): In the event of an emergency (i.e.: accident requiring emergency care), you are allowed to receive additional pain medication. However, you are responsible for: As soon as you are able, call our office [phone], at any time of the day or night, and leave a message stating your name, the date and nature of the emergency, and the name and dose of the medication prescribed. In the event that your call is answered by a member of our staff, make sure to document and save the date, time, and the name of the person that took your information.  2. Exception #2 (Planned Surgery): In the event that you are scheduled by another doctor or dentist to have any type of surgery or procedure, you are allowed (for a period no longer than 30 days), to receive additional pain medication, for the acute post-op pain. However, in this case, you are responsible for picking up a copy of our "Post-op Pain Management for Surgeons" handout, and giving it to your surgeon or dentist. This  document is available at our office, and does not require an appointment to obtain it. Simply go to our office during business hours (Monday-Thursday from 8:00 AM to 4:00 PM) (Friday 8:00 AM to 12:00 Noon) or if you have a scheduled appointment with us, prior to your surgery, and ask for it by name. In addition, you are responsible for: calling our office [phone], at any time of the day or night, and leaving a message stating your name, name of your surgeon, type of surgery, and date of procedure or surgery. Failure to comply with your responsibilities may result in termination of therapy involving the controlled substances.  *Opioid medications include: morphine, codeine, oxycodone, oxymorphone, hydrocodone, hydromorphone, meperidine, tramadol, tapentadol, buprenorphine, fentanyl, methadone. **Benzodiazepine medications include: diazepam (Valium), alprazolam (Xanax), clonazepam (Klonopine), lorazepam (Ativan), clorazepate (Tranxene), chlordiazepoxide (Librium), estazolam (Prosom), oxazepam (Serax), temazepam (Restoril), triazolam (Halcion) (Last updated: 05/07/2020) ____________________________________________________________________________________________   ____________________________________________________________________________________________  Medication Recommendations and Reminders  Applies to: All patients receiving prescriptions (written and/or electronic).  Medication Rules & Regulations: These rules and regulations exist for your safety and that of others. They are not flexible and neither are we. Dismissing or ignoring them will be considered "non-compliance" with medication therapy, resulting in complete and irreversible termination of such therapy. (See document titled "Medication Rules" for more details.) In all conscience, because of safety reasons, we cannot continue providing a therapy where the patient does not follow instructions.  Pharmacy of record:   Definition:  This is the pharmacy where your electronic prescriptions will be sent.   We do not endorse any particular pharmacy, however, we have experienced problems with [facility] not securing enough medication supply for the community.  We do not restrict you in your choice of pharmacy. However,   once we write for your prescriptions, we will NOT be re-sending more prescriptions to fix restricted supply problems created by your pharmacy, or your insurance.   The pharmacy listed in the electronic medical record should be the one where you want electronic prescriptions to be sent.  If you choose to change pharmacy, simply notify our nursing staff.  Recommendations:  Keep all of your pain medications in a safe place, under lock and key, even if you live alone. We will NOT replace lost, stolen, or damaged medication.  After you fill your prescription, take 1 week's worth of pills and put them away in a safe place. You should keep a separate, properly labeled bottle for this purpose. The remainder should be kept in the original bottle. Use this as your primary supply, until it runs out. Once it's gone, then you know that you have 1 week's worth of medicine, and it is time to come in for a prescription refill. If you do this correctly, it is unlikely that you will ever run out of medicine.  To make sure that the above recommendation works, it is very important that you make sure your medication refill appointments are scheduled at least 1 week before you run out of medicine. To do this in an effective manner, make sure that you do not leave the office without scheduling your next medication management appointment. Always ask the nursing staff to show you in your prescription , when your medication will be running out. Then arrange for the receptionist to get you a return appointment, at least 7 days before you run out of medicine. Do not wait until you have 1 or 2 pills left, to come in. This is very poor planning and  does not take into consideration that we may need to cancel appointments due to bad weather, sickness, or emergencies affecting our staff.  DO NOT ACCEPT A "Partial Fill": If for any reason your pharmacy does not have enough pills/tablets to completely fill or refill your prescription, do not allow for a "partial fill". The law allows the pharmacy to complete that prescription within 72 hours, without requiring a new prescription. If they do not fill the rest of your prescription within those 72 hours, you will need a separate prescription to fill the remaining amount, which we will NOT provide. If the reason for the partial fill is your insurance, you will need to talk to the pharmacist about payment alternatives for the remaining tablets, but again, DO NOT ACCEPT A PARTIAL FILL, unless you can trust your pharmacist to obtain the remainder of the pills within 72 hours.  Prescription refills and/or changes in medication(s):   Prescription refills, and/or changes in dose or medication, will be conducted only during scheduled medication management appointments. (Applies to both, written and electronic prescriptions.)  No refills on procedure days. No medication will be changed or started on procedure days. No changes, adjustments, and/or refills will be conducted on a procedure day. Doing so will interfere with the diagnostic portion of the procedure.  No phone refills. No medications will be "called into the pharmacy".  No Fax refills.  No weekend refills.  No Holliday refills.  No after hours refills.  Remember:  Business hours are:  Monday to Thursday 8:00 AM to 4:00 PM Provider's Schedule: Amanee Iacovelli, MD - Appointments are:  Medication management: Monday and Wednesday 8:00 AM to 4:00 PM Procedure day: Tuesday and Thursday 7:30 AM to 4:00 PM Bilal Lateef, MD - Appointments are:    Medication management: Tuesday and Thursday 8:00 AM to 4:00 PM Procedure day: Monday and Wednesday  7:30 AM to 4:00 PM (Last update: 12/28/2019) ____________________________________________________________________________________________   ____________________________________________________________________________________________  CBD (cannabidiol) WARNING  Applicable to: All individuals currently taking or considering taking CBD (cannabidiol) and, more important, all patients taking opioid analgesic controlled substances (pain medication). (Example: oxycodone; oxymorphone; hydrocodone; hydromorphone; morphine; methadone; tramadol; tapentadol; fentanyl; buprenorphine; butorphanol; dextromethorphan; meperidine; codeine; etc.)  Legal status: CBD remains a Schedule I drug prohibited for any use. CBD is illegal with one exception. In the [place], CBD has a limited Food and Drug Administration (FDA) approval for the treatment of two specific types of epilepsy disorders. Only one CBD product has been approved by the FDA for this purpose: "Epidiolex". FDA is aware that some companies are marketing products containing cannabis and cannabis-derived compounds in ways that violate the Federal Food, Drug and Cosmetic Act (FD&C Act) and that may put the health and safety of consumers at risk. The FDA, a Federal agency, has not enforced the CBD status since 2018.   Legality: Some manufacturers ship CBD products nationally, which is illegal. Often such products are sold online and are therefore available throughout the country. CBD is openly sold in head shops and health food stores in some states where such sales have not been explicitly legalized. Selling unapproved products with unsubstantiated therapeutic claims is not only a violation of the law, but also can put patients at risk, as these products have not been proven to be safe or effective. Federal illegality makes it difficult to conduct research on CBD.  Reference: "FDA Regulation of Cannabis and Cannabis-Derived Products, Including Cannabidiol  (CBD)" - https://www.fda.gov/news-events/public-health-focus/fda-regulation-cannabis-and-cannabis-derived-products-including-cannabidiol-cbd  Warning: CBD is not FDA approved and has not undergo the same manufacturing controls as prescription drugs.  This means that the purity and safety of available CBD may be questionable. Most of the time, despite manufacturer's claims, it is contaminated with THC (delta-9-tetrahydrocannabinol - the chemical in marijuana responsible for the "HIGH").  When this is the case, the THC contaminant will trigger a positive urine drug screen (UDS) test for Marijuana (carboxy-THC). Because a positive UDS for any illicit substance is a violation of our medication agreement, your opioid analgesics (pain medicine) may be permanently discontinued.  MORE ABOUT CBD  General Information: CBD  is a derivative of the Marijuana (cannabis sativa) plant discovered in 1940. It is one of the 113 identified substances found in Marijuana. It accounts for up to 40% of the plant's extract. As of 2018, preliminary clinical studies on CBD included research for the treatment of anxiety, movement disorders, and pain. CBD is available and consumed in multiple forms, including inhalation of smoke or vapor, as an aerosol spray, and by mouth. It may be supplied as an oil containing CBD, capsules, dried cannabis, or as a liquid solution. CBD is thought not to be as psychoactive as THC (delta-9-tetrahydrocannabinol - the chemical in marijuana responsible for the "HIGH"). Studies suggest that CBD may interact with different biological target receptors in the body, including cannabinoid and other neurotransmitter receptors. As of 2018 the mechanism of action for its biological effects has not been determined.  Side-effects  Adverse reactions: Dry mouth, diarrhea, decreased appetite, fatigue, drowsiness, malaise, weakness, sleep disturbances, and others.  Drug interactions: CBC may interact with other  medications such as blood-thinners. (Last update: 01/14/2020) ____________________________________________________________________________________________   ____________________________________________________________________________________________  Drug Holidays (Slow)  What is a "Drug Holiday"? Drug Holiday: is the name given to the period of time during   which a patient stops taking a medication(s) for the purpose of eliminating tolerance to the drug.  Benefits . Improved effectiveness of opioids. . Decreased opioid dose needed to achieve benefits. . Improved pain with lesser dose.  What is tolerance? Tolerance: is the progressive decreased in effectiveness of a drug due to its repetitive use. With repetitive use, the body gets use to the medication and as a consequence, it loses its effectiveness. This is a common problem seen with opioid pain medications. As a result, a larger dose of the drug is needed to achieve the same effect that used to be obtained with a smaller dose.  How long should a "Drug Holiday" last? You should stay off of the pain medicine for at least 14 consecutive days. (2 weeks)  Should I stop the medicine "cold turkey"? No. You should always coordinate with your Pain Specialist so that he/she can provide you with the correct medication dose to make the transition as smoothly as possible.  How do I stop the medicine? Slowly. You will be instructed to decrease the daily amount of pills that you take by one (1) pill every seven (7) days. This is called a "slow downward taper" of your dose. For example: if you normally take four (4) pills per day, you will be asked to drop this dose to three (3) pills per day for seven (7) days, then to two (2) pills per day for seven (7) days, then to one (1) per day for seven (7) days, and at the end of those last seven (7) days, this is when the "Drug Holiday" would start.   Will I have withdrawals? By doing a "slow downward  taper" like this one, it is unlikely that you will experience any significant withdrawal symptoms. Typically, what triggers withdrawals is the sudden stop of a high dose opioid therapy. Withdrawals can usually be avoided by slowly decreasing the dose over a prolonged period of time. If you do not follow these instructions and decide to stop your medication abruptly, withdrawals may be possible.  What are withdrawals? Withdrawals: refers to the wide range of symptoms that occur after stopping or dramatically reducing opiate drugs after heavy and prolonged use. Withdrawal symptoms do not occur to patients that use low dose opioids, or those who take the medication sporadically. Contrary to benzodiazepine (example: Valium, Xanax, etc.) or alcohol withdrawals ("Delirium Tremens"), opioid withdrawals are not lethal. Withdrawals are the physical manifestation of the body getting rid of the excess receptors.  Expected Symptoms Early symptoms of withdrawal may include: . Agitation . Anxiety . Muscle aches . Increased tearing . Insomnia . Runny nose . Sweating . Yawning  Late symptoms of withdrawal may include: . Abdominal cramping . Diarrhea . Dilated pupils . Goose bumps . Nausea . Vomiting  Will I experience withdrawals? Due to the slow nature of the taper, it is very unlikely that you will experience any.  What is a slow taper? Taper: refers to the gradual decrease in dose.  (Last update: 12/28/2019) ____________________________________________________________________________________________     

## 2020-08-07 ENCOUNTER — Ambulatory Visit
Admission: RE | Admit: 2020-08-07 | Discharge: 2020-08-07 | Disposition: A | Discharge status: Home / Self Care | Payer: Medicare Other | Source: Ambulatory Visit | Attending: Pain Medicine | Admitting: Pain Medicine

## 2020-08-07 ENCOUNTER — Ambulatory Visit: Payer: Medicare Other | Attending: Internal Medicine

## 2020-08-07 ENCOUNTER — Other Ambulatory Visit: Payer: Self-pay

## 2020-08-07 DIAGNOSIS — M899 Disorder of bone, unspecified: Secondary | ICD-10-CM | POA: Diagnosis not present

## 2020-08-07 DIAGNOSIS — G8929 Other chronic pain: Secondary | ICD-10-CM | POA: Insufficient documentation

## 2020-08-07 DIAGNOSIS — W108XXA Fall (on) (from) other stairs and steps, initial encounter: Secondary | ICD-10-CM | POA: Diagnosis not present

## 2020-08-07 DIAGNOSIS — M47816 Spondylosis without myelopathy or radiculopathy, lumbar region: Secondary | ICD-10-CM | POA: Insufficient documentation

## 2020-08-07 DIAGNOSIS — R2689 Other abnormalities of gait and mobility: Secondary | ICD-10-CM | POA: Insufficient documentation

## 2020-08-07 DIAGNOSIS — Z981 Arthrodesis status: Secondary | ICD-10-CM | POA: Insufficient documentation

## 2020-08-07 DIAGNOSIS — M5136 Other intervertebral disc degeneration, lumbar region: Secondary | ICD-10-CM | POA: Insufficient documentation

## 2020-08-07 DIAGNOSIS — M545 Low back pain, unspecified: Secondary | ICD-10-CM

## 2020-08-07 DIAGNOSIS — R262 Difficulty in walking, not elsewhere classified: Secondary | ICD-10-CM | POA: Insufficient documentation

## 2020-08-07 DIAGNOSIS — M6281 Muscle weakness (generalized): Secondary | ICD-10-CM | POA: Insufficient documentation

## 2020-08-07 DIAGNOSIS — R2681 Unsteadiness on feet: Secondary | ICD-10-CM | POA: Insufficient documentation

## 2020-08-07 NOTE — Therapy (Signed)
[place] MAIN [facility] SERVICES [address] [place], [place], [address] Phone: [phone]   Fax:  [phone]  Physical Therapy Treatment  Patient Details  Name: Kendra Benton MRN: [id number] Date of Birth: 15-Nov-1940 Referring Provider (PT): Dr. Sharia Reeve   Encounter Date: 08/07/2020   PT End of Session - 08/07/20 1022    Visit Number 6    Number of Visits 25    Date for PT Re-Evaluation 10/09/20    Authorization Type Initial PT Evaluation on 07/17/2020    Authorization Time Period 07/17/2020- 10/09/2020    PT Start Time 1010    PT Stop Time 1050    PT Time Calculation (min) 40 min    Equipment Utilized During Treatment Gait belt    Activity Tolerance Patient tolerated treatment well;No increased pain    Behavior During Therapy WFL for tasks assessed/performed           Past Medical History:  Diagnosis Date  . Allergy   . Anxiety   . GERD (gastroesophageal reflux disease)   . Headache   . History of kidney stones   . Hypertension   . Myocardial infarction ([place])    1997  . Osteoporosis   . Pneumonia     Past Surgical History:  Procedure Laterality Date  . ABDOMINAL HYSTERECTOMY     patient has ovaries  . APPENDECTOMY    . ARTERY BIOPSY Right 12/30/2017   Procedure: BIOPSY TEMPORAL ARTERY;  Surgeon: Katha Cabal, MD;  Location: [facility] ORS;  Service: Vascular;  Laterality: Right;  . ARTERY BIOPSY Left 06/18/2018   Procedure: BIOPSY TEMPORAL ARTERY;  Surgeon: Katha Cabal, MD;  Location: [facility] ORS;  Service: Vascular;  Laterality: Left;  . BACK SURGERY    . CARDIAC CATHETERIZATION    . CATARACT EXTRACTION W/ INTRAOCULAR LENS  IMPLANT, BILATERAL Bilateral 2010  . CHOLECYSTECTOMY N/A 02/28/2016   Procedure: LAPAROSCOPIC CHOLECYSTECTOMY WITH INTRAOPERATIVE CHOLANGIOGRAM;  Surgeon: Dia Crawford III, MD;  Location: [facility] ORS;  Service: General;  Laterality: N/A;  . LUMBAR FUSION  11/09/2016   L3-L5  . SPINE SURGERY     Disc   . TONSILLECTOMY      There were no vitals filed for this visit.   Subjective Assessment - 08/07/20 1018    Subjective Patient reports that was seen by pain management and has to go to obtain an Xray immediately following session today. Reports ongoing right sided low back pain- 5/10    Pertinent History Patient reports history of chronic LBP with past lumbar fusion surgery on 11/09/2016 and most recent lumbar facet joint block procedure on 06/19/2020. She reports past medical history of spondylosis, Lung Cancer, HTN. She reports increased difficulty performing ADL's yet still independent but has paid services who assist with cleaning.    Limitations Standing;Lifting;Walking;House hold activities    How long can you sit comfortably? no restrictions    How long can you stand comfortably? <30 min    How long can you walk comfortably? <10 min    Patient Stated Goals Improve my pain and walk better with no falls.    Currently in Pain? Yes    Pain Score 5     Pain Location Back    Pain Orientation Right    Pain Descriptors / Indicators Aching    Pain Type Chronic pain    Pain Onset More than a month ago    Pain Frequency Intermittent    Aggravating Factors  prolonged sitting, walking  Pain Relieving Factors Rest, heat, meds    Effect of Pain on Daily Activities Difficulty with ADLs and walking/shopping          Assessed Lumbar ROM- Full AROM with lumbar flex/ext without increased pain.   Manual PROM/stretching:  Double/single knee to chest x 20 sec x 3 each Lower trunk rotation x 20 sec x 3 each direction- Patient reported some stiffness on right side yet no increase in pain.  HS stretch-  Piriformis-Hold 20 sec x 3 each leg- Patient reports some tightness on right side more than left but no increase in pain.   Patient denied any pain with above stretching- only feeling "Stretch" today.  Observed patient with gait using her cane- Discussed appropriate height as patient is using a  non-adjustable cane that is high. Patient demonstrated safe mobility with her cane but did discuss and have her try an adjustable cane and she reported no significant difference in comfort between the two canes.  Added TA contraction while alt leg lift (foot 6" off mat) x 10. Patient able to perform well with no increase in pain. TA contraction- Alt UE/LE lift (dead bug) x 15 reps each- patient experienced difficulty initially with sequencing- yet no increased in pain. TA contraction with eccentric LE drop x 10 reps each leg- Patient with initial difficulty with concept but did improve with practice.                             PT Education - 08/07/20 1632    Education Details Review of core stabilization and Low back stretching education.    Person(s) Educated Patient    Methods Explanation;Demonstration;Tactile cues;Verbal cues    Comprehension Verbalized understanding;Returned demonstration;Verbal cues required;Tactile cues required;Need further instruction            PT Short Term Goals - 07/17/20 2123      PT SHORT TERM GOAL #1   Title Patient will be independent in home exercise program to improve pain relief/ strength/mobility for better functional independence with ADLs.    Baseline 07/17/2020- Patient doesn't have any formal HEP in place.    Time 6    Period Weeks    Status New    Target Date 08/28/20             PT Long Term Goals - 07/17/20 2125      PT LONG TERM GOAL #1   Title Patient will increase FOTO score to equal to or greater than 63 to demonstrate statistically significant improvement in mobility and quality of life.    Baseline 07/17/2020: FOTO score=60    Time 12    Period Weeks    Status New    Target Date 10/09/20      PT LONG TERM GOAL #2   Title Pt will decrease worst back pain as reported on NPRS by at least 2 points in order to demonstrate clinically significant reduction in back pain.    Baseline 07/17/2020- 5/10 LBP at  worst.    Time 12    Period Weeks    Status New    Target Date 10/09/20      PT LONG TERM GOAL #3   Title Patient (> 46 years old) will complete five times sit to stand test in < 18 seconds indicating an increased LE strength and improved balance.    Baseline 07/17/2020- 26.02 sec    Time 12    Period Weeks  Status New    Target Date 10/09/20      PT LONG TERM GOAL #4   Title Patient will increase Berg Balance score by > 4 points to demonstrate decreased fall risk during functional activities.    Baseline 07/17/2020- BERG = 49/56    Time 12    Period Weeks    Status New    Target Date 10/09/20      PT LONG TERM GOAL #5   Title Patient will increase 10 meter walk test to >1.70m/s as to improve gait speed for better community ambulation and to reduce fall risk.    Baseline 07/17/2020= 10MWT avg=11.0 sec (0.91 m/s) without an AD    Time 12    Period Weeks    Status New    Target Date 10/09/20                 Plan - 08/07/20 1023    Clinical Impression Statement Treatment limited to gentle stretching and review/progression of core stabilization due to pending xray to rule any lumbar fx from recent fall. Patient able to perform all stretching and core therex painfreee today and good understanding of these exercises to perform in her home program. She will continue to benefit from skilled PT services to improve her overall pain and progress toward safe mobility in home and community with decreased risk of falling.    Personal Factors and Comorbidities Age;Comorbidity 3+    Comorbidities Osteoporosis, HTN, CHF    Examination-Activity Limitations Bend;Caring for Others;Carry;Lift;Reach Overhead;Sit;Squat;Stairs;Stand    Examination-Participation Restrictions Cleaning;Community Activity;Laundry;Shop;Yard Work    Stability/Clinical Decision Making Stable/Uncomplicated    Rehab Potential Good    PT Frequency 2x / week    PT Duration 12 weeks    PT Treatment/Interventions  ADLs/Self Care Home Management;Cryotherapy;Moist Heat;DME Instruction;Stair training;Functional mobility training;Therapeutic activities;Therapeutic exercise;Balance training;Neuromuscular re-education;Patient/family education;Manual techniques    PT Next Visit Plan LE strengthening, Low back stretching, manual therapy for pain relief,  balance training.    PT Home Exercise Plan add some core stabilization to current HEP    Consulted and Agree with Plan of Care Patient           Patient will benefit from skilled therapeutic intervention in order to improve the following deficits and impairments:  Abnormal gait,Decreased balance,Decreased coordination,Decreased activity tolerance,Cardiopulmonary status limiting activity,Decreased endurance,Decreased mobility,Decreased strength,Difficulty walking,Pain  Visit Diagnosis: Difficulty in walking, not elsewhere classified  Unsteadiness on feet  Muscle weakness (generalized)     Problem List Patient Active Problem List   Diagnosis Date Noted  . Fall (on) (from) other stairs and steps, initial encounter 08/06/2020  . Lumbar facet joint syndrome 06/18/2020  . Spondylosis without myelopathy or radiculopathy, lumbosacral region 06/18/2020  . DDD (degenerative disc disease), lumbosacral 06/18/2020  . COVID 06/04/2020  . Chronic pain syndrome 05/16/2020  . Pharmacologic therapy 05/16/2020  . Disorder of skeletal system 05/16/2020  . Problems influencing health status 05/16/2020  . 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 Chronic hip pain (2ry area of Pain) (Bilateral) (R>L) 05/16/2020  . Chronic lower extremity pain (Left) 05/16/2020  . Numbness and tingling of both feet (intermittent/recurrent) 05/16/2020  . Pain in rib (Bilateral) (R>L) (intermittent) 05/16/2020  . Chronic knee pain (Left) 05/16/2020  . Patellar pain (Left) (chronic, intermittent) 05/16/2020  . Hard of hearing 05/16/2020  . 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  S/P partial lobectomy of lung 02/25/2019  . Mass of upper lobe of right lung 01/06/2019  . Chronic nonintractable headache  06/14/2018  . Head ache 12/03/2017  . Sinus bradycardia 10/06/2017  . Chronic low back pain (1ry area of Pain) (Bilateral) (L>R) w/o sciatica 08/17/2017  . Panic attack as reaction to stress 08/17/2017  . Anxiety 06/15/2017  . Atypical chest pain 06/15/2017  . Hyperlipidemia 06/15/2017  . Cholecystitis with cholelithiasis 02/27/2016  . Increased frequency of urination 01/27/2015  . History of night sweats 05/01/2014  . Dupuytren's contracture 07/18/2013  . Inverted nipple 04/18/2013  . Depression 04/23/2010  . Fibromyalgia 04/23/2010  . Hypercholesterolemia 04/23/2010  . Essential hypertension 04/23/2010  . Hypothyroidism 04/23/2010  . Irritable colon 04/23/2010  . Osteoarthritis 04/23/2010    Lewis Moccasin, PT 08/07/2020, 4:45 PM  [place] MAIN [facility] SERVICES [address] [place], [place], [address] Phone: [phone]   Fax:  [phone]  Name: Kendra Benton MRN: [id number] Date of Birth: 12/09/1940

## 2020-08-09 ENCOUNTER — Ambulatory Visit: Payer: Medicare Other

## 2020-08-13 ENCOUNTER — Ambulatory Visit: Payer: Medicare Other

## 2020-08-15 ENCOUNTER — Other Ambulatory Visit: Payer: Self-pay

## 2020-08-15 ENCOUNTER — Ambulatory Visit: Payer: Medicare Other

## 2020-08-15 DIAGNOSIS — R262 Difficulty in walking, not elsewhere classified: Secondary | ICD-10-CM | POA: Diagnosis present

## 2020-08-15 DIAGNOSIS — R2681 Unsteadiness on feet: Secondary | ICD-10-CM | POA: Diagnosis present

## 2020-08-15 DIAGNOSIS — M6281 Muscle weakness (generalized): Secondary | ICD-10-CM

## 2020-08-15 DIAGNOSIS — R2689 Other abnormalities of gait and mobility: Secondary | ICD-10-CM | POA: Diagnosis present

## 2020-08-15 NOTE — Therapy (Signed)
[place] MAIN [facility] SERVICES [address] [place], [place], [address] Phone: [phone]   Fax:  [phone]  Physical Therapy Treatment  Patient Details  Name: Kendra Benton MRN: [id number] Date of Birth: [birth year] Referring Provider (PT): Dr. Sharia Reeve   Encounter Date: 08/15/2020   PT End of Session - 08/15/20 0951    Visit Number 7    Number of Visits 25    Date for PT Re-Evaluation 10/09/20    Authorization Type Initial PT Evaluation on 07/17/2020    Authorization Time Period 07/17/2020- 10/09/2020    PT Start Time 0945    PT Stop Time 1029    PT Time Calculation (min) 44 min    Equipment Utilized During Treatment Gait belt    Activity Tolerance Patient tolerated treatment well;No increased pain    Behavior During Therapy WFL for tasks assessed/performed           Past Medical History:  Diagnosis Date  . Allergy   . Anxiety   . GERD (gastroesophageal reflux disease)   . Headache   . History of kidney stones   . Hypertension   . Myocardial infarction ([place])    1997  . Osteoporosis   . Pneumonia     Past Surgical History:  Procedure Laterality Date  . ABDOMINAL HYSTERECTOMY     patient has ovaries  . APPENDECTOMY    . ARTERY BIOPSY Right 12/30/2017   Procedure: BIOPSY TEMPORAL ARTERY;  Surgeon: Katha Cabal, MD;  Location: [facility] ORS;  Service: Vascular;  Laterality: Right;  . ARTERY BIOPSY Left 06/18/2018   Procedure: BIOPSY TEMPORAL ARTERY;  Surgeon: Katha Cabal, MD;  Location: [facility] ORS;  Service: Vascular;  Laterality: Left;  . BACK SURGERY    . CARDIAC CATHETERIZATION    . CATARACT EXTRACTION W/ INTRAOCULAR LENS  IMPLANT, BILATERAL Bilateral 2010  . CHOLECYSTECTOMY N/A 02/28/2016   Procedure: LAPAROSCOPIC CHOLECYSTECTOMY WITH INTRAOPERATIVE CHOLANGIOGRAM;  Surgeon: Dia Crawford III, MD;  Location: [facility] ORS;  Service: General;  Laterality: N/A;  . LUMBAR FUSION  11/09/2016   L3-L5  . SPINE SURGERY     Disc   . TONSILLECTOMY      There were no vitals filed for this visit.   Subjective Assessment - 08/15/20 0950    Subjective Patient mixed up on times again this session. Reports she hasn't heard back about her x ray although this therapist can see otherwise in her chart.    Pertinent History Patient reports history of chronic LBP with past lumbar fusion surgery on 11/09/2016 and most recent lumbar facet joint block procedure on 06/19/2020. She reports past medical history of spondylosis, Lung Cancer, HTN. She reports increased difficulty performing ADL's yet still independent but has paid services who assist with cleaning.    Limitations Standing;Lifting;Walking;House hold activities    How long can you sit comfortably? no restrictions    How long can you stand comfortably? <30 min    How long can you walk comfortably? <10 min    Patient Stated Goals Improve my pain and walk better with no falls.    Pain Score 3     Pain Location Back    Pain Orientation Right    Pain Descriptors / Indicators Aching    Pain Type Chronic pain    Pain Onset More than a month ago             Treatment:     Standing in // bars:   Standing  with CGA next to support surface:  Airex pad: static stand 30 seconds x 2 trials, noticeable trembling of ankles/LE's with fatigue and challenge to maintain stability Airex pad: horizontal head turns 30 seconds scanning room 10x ; cueing for arc of motion  Airex pad: vertical head turns 30 seconds, cueing for arc of motion, noticeable sway with upward gaze increasing demand on ankle righting reaction musculature Airex pad: one foot on 6" step one foot on airex pad, hold position for 60 seconds, switch legs, 2x each LE; frequent posterior LOB   Standing with #2lb ankle weight -4" step toe taps 12x each LE , BUE support -hip extension 10x each LE, BUE support -side step 4x length of // bars  seated: heat pad behind back: GTB abduction 15x GTB march with abdominal  contraction 12x each LE Swiss ball TrA contraction with UE/LE press  With 2lb ankle weight:  -LAQ 10x 3 second holds  -alternating ER/IR 10x each LE    Pt educated throughout session about proper posture and technique with exercises. Improved exercise technique, movement at target joints, use of target muscles after min to mod verbal, visual, tactile cues.   Patient tolerated progression to standing interventions well with intermittent seated rest breaks and use of heat pad. She is highly motivated throughout session. Patient is limited with ankle righting reactions that improves with cues for coordination and focus on task. She will continue to benefit from skilled PT services to improve her overall pain and progress toward safe mobility in home and community with decreased risk of falling                   PT Education - 08/15/20 0951    Education Details exercise technique, body mechanics    Person(s) Educated Patient    Methods Explanation;Demonstration;Tactile cues;Verbal cues    Comprehension Verbalized understanding;Returned demonstration;Verbal cues required;Tactile cues required            PT Short Term Goals - 07/17/20 2123      PT SHORT TERM GOAL #1   Title Patient will be independent in home exercise program to improve pain relief/ strength/mobility for better functional independence with ADLs.    Baseline 07/17/2020- Patient doesn't have any formal HEP in place.    Time 6    Period Weeks    Status New    Target Date 08/28/20             PT Long Term Goals - 07/17/20 2125      PT LONG TERM GOAL #1   Title Patient will increase FOTO score to equal to or greater than 63 to demonstrate statistically significant improvement in mobility and quality of life.    Baseline 07/17/2020: FOTO score=60    Time 12    Period Weeks    Status New    Target Date 10/09/20      PT LONG TERM GOAL #2   Title Pt will decrease worst back pain as reported on NPRS by  at least 2 points in order to demonstrate clinically significant reduction in back pain.    Baseline 07/17/2020- 5/10 LBP at worst.    Time 12    Period Weeks    Status New    Target Date 10/09/20      PT LONG TERM GOAL #3   Title Patient (> 57 years old) will complete five times sit to stand test in < 18 seconds indicating an increased LE strength and improved balance.  Baseline 07/17/2020- 26.02 sec    Time 12    Period Weeks    Status New    Target Date 10/09/20      PT LONG TERM GOAL #4   Title Patient will increase Berg Balance score by > 4 points to demonstrate decreased fall risk during functional activities.    Baseline 07/17/2020- BERG = 49/56    Time 12    Period Weeks    Status New    Target Date 10/09/20      PT LONG TERM GOAL #5   Title Patient will increase 10 meter walk test to >1.48m/s as to improve gait speed for better community ambulation and to reduce fall risk.    Baseline 07/17/2020= 10MWT avg=11.0 sec (0.91 m/s) without an AD    Time 12    Period Weeks    Status New    Target Date 10/09/20                 Plan - 08/15/20 1019    Clinical Impression Statement Patient tolerated progression to standing interventions well with intermittent seated rest breaks and use of heat pad. She is highly motivated throughout session. Patient is limited with ankle righting reactions that improves with cues for coordination and focus on task. She will continue to benefit from skilled PT services to improve her overall pain and progress toward safe mobility in home and community with decreased risk of falling    Personal Factors and Comorbidities Age;Comorbidity 3+    Comorbidities Osteoporosis, HTN, CHF    Examination-Activity Limitations Bend;Caring for Others;Carry;Lift;Reach Overhead;Sit;Squat;Stairs;Stand    Examination-Participation Restrictions Cleaning;Community Activity;Laundry;Shop;Yard Work    Stability/Clinical Decision Making Stable/Uncomplicated     Rehab Potential Good    PT Frequency 2x / week    PT Duration 12 weeks    PT Treatment/Interventions ADLs/Self Care Home Management;Cryotherapy;Moist Heat;DME Instruction;Stair training;Functional mobility training;Therapeutic activities;Therapeutic exercise;Balance training;Neuromuscular re-education;Patient/family education;Manual techniques    PT Next Visit Plan LE strengthening, Low back stretching, manual therapy for pain relief,  balance training.    PT Home Exercise Plan add some core stabilization to current HEP    Consulted and Agree with Plan of Care Patient           Patient will benefit from skilled therapeutic intervention in order to improve the following deficits and impairments:  Abnormal gait,Decreased balance,Decreased coordination,Decreased activity tolerance,Cardiopulmonary status limiting activity,Decreased endurance,Decreased mobility,Decreased strength,Difficulty walking,Pain  Visit Diagnosis: Difficulty in walking, not elsewhere classified  Unsteadiness on feet  Muscle weakness (generalized)     Problem List Patient Active Problem List   Diagnosis Date Noted  . Fall (on) (from) other stairs and steps, initial encounter 08/06/2020  . Lumbar facet joint syndrome 06/18/2020  . Spondylosis without myelopathy or radiculopathy, lumbosacral region 06/18/2020  . DDD (degenerative disc disease), lumbosacral 06/18/2020  . COVID 06/04/2020  . Chronic pain syndrome 05/16/2020  . Pharmacologic therapy 05/16/2020  . Disorder of skeletal system 05/16/2020  . Problems influencing health status 05/16/2020  . 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 Chronic hip pain (2ry area of Pain) (Bilateral) (R>L) 05/16/2020  . Chronic lower extremity pain (Left) 05/16/2020  . Numbness and tingling of both feet (intermittent/recurrent) 05/16/2020  . Pain in rib (Bilateral) (R>L) (intermittent) 05/16/2020  . Chronic knee pain (Left) 05/16/2020  . Patellar pain (Left) (chronic,  intermittent) 05/16/2020  . Hard of hearing 05/16/2020  . Carcinoma, lung, right ([place]) 06/25/2019  . S/P partial lobectomy of lung 02/25/2019  . Mass  of upper lobe of right lung 01/06/2019  . Chronic nonintractable headache 06/14/2018  . Head ache 12/03/2017  . Sinus bradycardia 10/06/2017  . Chronic low back pain (1ry area of Pain) (Bilateral) (L>R) w/o sciatica 08/17/2017  . Panic attack as reaction to stress 08/17/2017  . Anxiety 06/15/2017  . Atypical chest pain 06/15/2017  . Hyperlipidemia 06/15/2017  . Cholecystitis with cholelithiasis 02/27/2016  . Increased frequency of urination 01/27/2015  . History of night sweats 05/01/2014  . Dupuytren's contracture 07/18/2013  . Inverted nipple 04/18/2013  . Depression 04/23/2010  . Fibromyalgia 04/23/2010  . Hypercholesterolemia 04/23/2010  . Essential hypertension 04/23/2010  . Hypothyroidism 04/23/2010  . Irritable colon 04/23/2010  . Osteoarthritis 04/23/2010   Janna Arch, PT, DPT   08/15/2020, 10:48 AM  [place] MAIN [facility] SERVICES [address] [place], [place], [address] Phone: [phone]   Fax:  [phone]  Name: Kendra Benton MRN: [id number] Date of Birth: 1941/04/17

## 2020-08-18 NOTE — Progress Notes (Signed)
Patient: Kendra Benton  Service Category: E/M  Provider: Gaspar Cola, MD  DOB: May 30, 1941  DOS: 08/21/2020  Location: Office  MRN: [id number]  Setting: Ambulatory outpatient  Referring Provider: Jacklynn Barnacle  Type: Established Patient  Specialty: Interventional Pain Management  PCP: Jacklynn Barnacle, MD  Location: Remote location  Delivery: TeleHealth     Virtual Encounter - Pain Management PROVIDER NOTE: Information contained herein reflects review and annotations entered in association with encounter. Interpretation of such information and data should be left to medically-trained personnel. Information provided to patient can be located elsewhere in the medical record under "Patient Instructions". Document created using STT-dictation technology, any transcriptional errors that may result from process are unintentional.    Contact & Pharmacy Preferred: [phone] Home: [phone] (home) Mobile: [phone] (mobile) E-mail: [email or web address], [name] Phone: [phone] Fax: [phone]   Pre-screening  Kendra Benton offered "in-person" vs "virtual" encounter. She indicated preferring virtual for this encounter.   Reason COVID-19*  Social distancing based on CDC and AMA recommendations.   I contacted Kendra Benton on 08/21/2020 via telephone.      I clearly identified myself as Gaspar Cola, MD. I verified that I was speaking with the correct person using two identifiers (Name: Kendra Benton, and date of birth: June 04, 1941).  Consent I sought verbal advanced consent from Kendra Benton for virtual visit interactions. I informed Kendra Benton of possible security and privacy concerns, risks, and limitations associated with providing "not-in-person" medical evaluation and management services. I also informed Kendra Benton of the  availability of "in-person" appointments. Finally, I informed her that there would be a charge for the virtual visit and that she could be  personally, fully or partially, financially responsible for it. Kendra Benton expressed understanding and agreed to proceed.   Historic Elements   Kendra Benton is a 80 y.o. year old, female patient evaluated today after our last contact on 08/06/2020. Kendra Benton  has a past medical history of Allergy, Anxiety, GERD (gastroesophageal reflux disease), Headache, History of kidney stones, Hypertension, Myocardial infarction ([place]), Osteoporosis, and Pneumonia. She also  has a past surgical history that includes Cholecystectomy (N/A, 02/28/2016); Spine surgery; Abdominal hysterectomy; Appendectomy; Lumbar fusion (11/09/2016); Cardiac catheterization; Back surgery; Tonsillectomy; Cataract extraction w/ intraocular lens  implant, bilateral (Bilateral, 2010); Artery Biopsy (Right, 12/30/2017); and Artery Biopsy (Left, 06/18/2018). Kendra Benton has a current medication list which includes the following prescription(s): acetaminophen, albuterol, atorvastatin, calcium-vitamin d, dicyclomine, duloxetine, duloxetine, estradiol, furosemide, gabapentin, carboxymethylcellulose sodium, loperamide, losartan, naproxen sodium, omeprazole, oxcarbazepine, fibercon, potassium chloride sa, temazepam, tramadol, turmeric, and lisinopril. She  reports that she quit smoking about 42 years ago. Her smoking use included cigarettes. She has a 20.00 pack-year smoking history. She has never used smokeless tobacco. She reports current alcohol use of about 10.0 standard drinks of alcohol per week. She reports that she does not use drugs. Kendra Benton is allergic to spironolactone, hydrochlorothiazide, ciprofloxacin, hydralazine, levofloxacin, amlodipine, and latex.   HPI  Today, she is being contacted for follow-up evaluation to review the results of the lumbar spine x-rays done in flexion and extension on  08/07/2020.  According to the official report, the patient has stable postsurgical and degenerative changes with no acute abnormalities.  There is no evidence of instability.  He describes a fusion of L3-4 and L4-5 with bilateral intrapedicular screw placements and interbody fusion of the L4-5  level it also describes that there appears to be near fusion of the L3-4 disc space due to degenerative changes there is a stable moderate compression deformity of L2 consistent with an old fracture.  No acute fractures identified.  There is minimal grade 1 retrolisthesis of L2 over L3 secondary to severe degenerative disc disease at that level.        Today we went over the results of her recent lumbar spine x-rays and I have explained those in layman's terms.  Although she does well with the bilateral lumbar facet blocks, the results do not last that long.  The last injection we did was on 06/19/2020 and she refers that this has already worn off.  Because of the hardware that she has from her fusion, she is not a candidate for radiofrequency ablation since it would not be approved by her insurance company.  Furthermore, the anatomy would suggest that she has quite a bit of calcification in the lumbar region and this would make it extremely difficult to be able to attain adequate stimulation during the procedure.  For this reason we talked about some other alternatives such as a spinal cord stimulator or intrathecal pump.  She already had a spinal cord stimulator in the past which was removed prior to her having the lumbar fusion.  At that time, the technology from the spinal cord stimulator was such that it provided better coverage of the lower extremities as opposed to the lower back pain.  Because it did not provide her with any significant benefit in terms of her lower back, she had it removed then proceeded with a fusion which in hindsight has also not provide her with any long-term relief of her lower back pain.   Today I explained to the patient that the technology of the spinal cord stimulators has advanced to the point where we can now offer 16 electrode leads for a total of 32 electrodes and we can also use high-frequency stimulation which tends to be subclinical where she may not feel the stimulation, but he tends to block the nociception.  Another alternative would be the possibility of an intrathecal pump.  This alternative would definitely cover the pain in the lower back and the lower extremities as long as we positioned the catheter close to the T7 region.  I have explained to the patient that just like the spinal cord stimulator, before we put a intrathecal pump, it would require a trial.  The trial consist of an intrathecal injection of fentanyl to see if she would develop any side effects such as pruritus or urinary retention.  If she did not end the same time she obtained good relief of the pain, then we could move onto a permanent implant where we could then contemplate the possibility of using local anesthetics on a continuous infusion.  This would provide her with significant benefit in the sense that it would minimize the opioids as well as any side effects from those opioids.  At age 18, I have much rather control her pain using intrathecal pump than oral medications since this could make her unstable on her feet increasing the risk of a fall with a subsequent fracture.  All of this information was shared with the patient today.  Because of the complexity of the information the patient indicated that she would like for me to talk to a friend of hers who is a retired Publishing rights manager by the name of Dr. Fletcher Anon today she has provided  me with a verbal consent to share her medical information with her friend.  Post-Procedure Evaluation  Procedure (06/19/2020): Diagnostic bilateral lumbar facet block #1 under fluoroscopic guidance and IV sedation Pre-procedure pain level: 6/10 Post-procedure: 0/10 (100%  relief)  Sedation: Sedation provided.  Effectiveness during initial hour after procedure(Ultra-Short Term Relief):  100%.  Local anesthetic used: Long-acting (4-6 hours) Effectiveness: Defined as any analgesic benefit obtained secondary to the administration of local anesthetics. This carries significant diagnostic value as to the etiological location, or anatomical origin, of the pain. Duration of benefit is expected to coincide with the duration of the local anesthetic used.  Effectiveness during initial 4-6 hours after procedure(Short-Term Relief):  100%.  Long-term benefit: Defined as any relief past the pharmacologic duration of the local anesthetics.  Effectiveness past the initial 6 hours after procedure(Long-Term Relief):  80%.  Current benefits: Defined as benefit that persist at this time.   Analgesia:  80%  Pharmacotherapy Assessment  Analgesic: None MME/day: 0 mg/day   Monitoring: [place] PMP: PDMP reviewed during this encounter.       Pharmacotherapy: No side-effects or adverse reactions reported. Compliance: No problems identified. Effectiveness: Clinically acceptable. Plan: Refer to "POC".  UDS:  Summary  Date Value Ref Range Status  07/03/2020 Note  Final    Comment:    ==================================================================== [employer] (MW) ==================================================================== Test                             Result       Flag       Units  Drug Present and Declared for Prescription Verification   Oxazepam                       3005         EXPECTED   ng/mg creat   Temazepam                      >9524        EXPECTED   ng/mg creat    Oxazepam and temazepam are expected metabolites of diazepam.    Oxazepam is also an expected metabolite of other benzodiazepine    drugs, including chlordiazepoxide, prazepam, clorazepate, halazepam,    and temazepam.  Oxazepam and temazepam are available as scheduled     prescription medications.    Tramadol                       20467        EXPECTED   ng/mg creat   O-Desmethyltramadol            8262         EXPECTED   ng/mg creat   N-Desmethyltramadol            6733         EXPECTED   ng/mg creat    Source of tramadol is a prescription medication. O-desmethyltramadol    and N-desmethyltramadol are expected metabolites of tramadol.  ==================================================================== Test                      Result    Flag   Units      Ref Range   Creatinine              21               mg/dL      >=  20 ==================================================================== Declared Medications:  The flagging and interpretation on this report are based on the  following declared medications.  Unexpected results may arise from  inaccuracies in the declared medications.   **Note: The testing scope of this panel includes these medications:   Temazepam (Restoril)  Tramadol (Ultram)   **Note: The testing scope of this panel does not include the  following reported medications:   Acetaminophen (Tylenol)  Albuterol  Atorvastatin (Lipitor)  Calcium  Dicyclomine (Bentyl)  Duloxetine (Cymbalta)  Estradiol (Estrace)  Eye Drops  Furosemide (Lasix)  Gabapentin (Neurontin)  Lisinopril (Zestril)  Loperamide (Imodium)  Losartan (Cozaar)  Naproxen (Aleve)  Omeprazole (Prilosec)  Oxcarbazepine (Trileptal)  Potassium (Klor-Con)  Turmeric  Vitamin D ==================================================================== For clinical consultation, please call [phone]. ====================================================================     Laboratory Chemistry Profile   Renal Lab Results  Component Value Date   BUN 20 05/16/2020   CREATININE 0.81 05/16/2020   BCR 25 05/16/2020   GFRAA 79 05/16/2020   GFRNONAA 68 05/16/2020     Hepatic Lab Results  Component Value Date   AST 16 05/16/2020   ALT 17 08/27/2018   ALBUMIN  4.1 05/16/2020   ALKPHOS 86 05/16/2020   LIPASE 32 08/27/2018     Electrolytes Lab Results  Component Value Date   NA 135 05/16/2020   K 4.6 05/16/2020   CL 93 (L) 05/16/2020   CALCIUM 9.4 05/16/2020   MG 1.7 05/16/2020     Bone Lab Results  Component Value Date   25OHVITD1 35 05/16/2020   25OHVITD2 <1.0 05/16/2020   25OHVITD3 35 05/16/2020     Inflammation (CRP: Acute Phase) (ESR: Chronic Phase) Lab Results  Component Value Date   CRP 1 05/16/2020   ESRSEDRATE 2 05/16/2020   LATICACIDVEN 1.5 02/27/2016       Note: Above Lab results reviewed.  Imaging  DG Lumbar Spine Complete W/Bend CLINICAL DATA:  Chronic low back pain.  EXAM: LUMBAR SPINE - COMPLETE WITH BENDING VIEWS  COMPARISON:  May 16, 2020.  FINDINGS: Status post surgical posterior fusion of L3-4 and L4-5 with bilateral intrapedicular screw placement and interbody fusion of L4-5. There appears to be near fusion of the L3-4 disc space due to degenerative change. Stable moderate compression deformity of L2 vertebral body is noted consistent with old fracture. No acute fracture is noted. Stable minimal grade 1 retrolisthesis of L2-3 is noted secondary to severe degenerative disc disease at this level. No change in vertebral body alignment is noted on flexion or extension views.  IMPRESSION: Stable postsurgical and degenerative changes as described above. No acute abnormality seen in the lumbar spine. No change in vertebral body alignment is noted on flexion or extension views.  Electronically Signed   By: Marijo Conception M.D.   On: 08/08/2020 09:16  Assessment  The primary encounter diagnosis was Chronic pain syndrome. Diagnoses of Chronic low back pain (1ry area of Pain) (Bilateral) (L>R) w/o sciatica, Lumbar facet joint syndrome, Chronic hip pain (2ry area of Pain) (Bilateral) (R>L), Chronic lower extremity pain (Left), Failed back surgical syndrome, and DDD (degenerative disc disease),  lumbosacral were also pertinent to this visit.  Plan of Care  Problem-specific:  No problem-specific Assessment & Plan notes found for this encounter.  [name] has a current medication list which includes the following long-term medication(s): albuterol, atorvastatin, calcium-vitamin d, dicyclomine, duloxetine, duloxetine, estradiol, furosemide, losartan, omeprazole, oxcarbazepine, temazepam, tramadol, and lisinopril.  Pharmacotherapy (Medications Ordered): No orders of the defined types were placed  in this encounter.  Orders:  No orders of the defined types were placed in this encounter.  Follow-up plan:   Return for scheduled encounter.      Interventional Therapies  Risk  Complexity Considerations:   Advanced age  Latex allergy  For candidate for Lumbar RF secondary to hardware     Planned  Pending:      Under consideration:   Possible diagnostic midline caudal ESI + diagnostic epidurogram #1  Diagnostic bilateral IA hip joint injection  Diagnostic left IA knee injection    Completed:   Diagnostic bilateral lumbar facet MBB x1 (06/19/2020)   Therapeutic  Palliative (PRN) options:   None established       Recent Visits Date Type Provider Dept  08/06/20 Office Visit Milinda Pointer, MD [facility]  07/03/20 Office Visit Milinda Pointer, MD [facility]  06/19/20 Procedure visit Milinda Pointer, MD [facility]  06/18/20 Office Visit Milinda Pointer, MD [facility]  Showing recent visits within past 90 days and meeting all other requirements Today's Visits Date Type Provider Dept  08/21/20 Telemedicine Milinda Pointer, MD [facility]  Showing today's visits and meeting all other requirements Future Appointments Date Type Provider Dept  11/14/20 Appointment Milinda Pointer, MD [facility]  Showing future appointments within next 90 days and meeting all other requirements  I  discussed the assessment and treatment plan with the patient. The patient was provided an opportunity to ask questions and all were answered. The patient agreed with the plan and demonstrated an understanding of the instructions.  Patient advised to call back or seek an in-person evaluation if the symptoms or condition worsens.  Duration of encounter: 35 minutes.  Note by: Gaspar Cola, MD Date: 08/21/2020; Time: 5:43 PM

## 2020-08-20 ENCOUNTER — Ambulatory Visit: Payer: Medicare Other

## 2020-08-20 ENCOUNTER — Telehealth: Payer: Self-pay

## 2020-08-20 NOTE — Telephone Encounter (Signed)
LM  For patient to call office for pre virtual appointment questions.

## 2020-08-21 ENCOUNTER — Encounter: Payer: Self-pay | Admitting: Pain Medicine

## 2020-08-21 ENCOUNTER — Ambulatory Visit: Payer: Medicare Other | Attending: Pain Medicine | Admitting: Pain Medicine

## 2020-08-21 ENCOUNTER — Other Ambulatory Visit: Payer: Self-pay

## 2020-08-21 DIAGNOSIS — M25552 Pain in left hip: Secondary | ICD-10-CM

## 2020-08-21 DIAGNOSIS — M79605 Pain in left leg: Secondary | ICD-10-CM

## 2020-08-21 DIAGNOSIS — M47816 Spondylosis without myelopathy or radiculopathy, lumbar region: Secondary | ICD-10-CM | POA: Diagnosis not present

## 2020-08-21 DIAGNOSIS — G8929 Other chronic pain: Secondary | ICD-10-CM

## 2020-08-21 DIAGNOSIS — M545 Low back pain, unspecified: Secondary | ICD-10-CM

## 2020-08-21 DIAGNOSIS — M25551 Pain in right hip: Secondary | ICD-10-CM | POA: Diagnosis not present

## 2020-08-21 DIAGNOSIS — G894 Chronic pain syndrome: Secondary | ICD-10-CM | POA: Diagnosis not present

## 2020-08-21 DIAGNOSIS — M5137 Other intervertebral disc degeneration, lumbosacral region: Secondary | ICD-10-CM

## 2020-08-21 DIAGNOSIS — M51379 Other intervertebral disc degeneration, lumbosacral region without mention of lumbar back pain or lower extremity pain: Secondary | ICD-10-CM

## 2020-08-21 DIAGNOSIS — M961 Postlaminectomy syndrome, not elsewhere classified: Secondary | ICD-10-CM

## 2020-08-22 ENCOUNTER — Ambulatory Visit: Payer: Medicare Other

## 2020-08-22 ENCOUNTER — Other Ambulatory Visit: Payer: Self-pay

## 2020-08-22 DIAGNOSIS — R2681 Unsteadiness on feet: Secondary | ICD-10-CM

## 2020-08-22 DIAGNOSIS — M6281 Muscle weakness (generalized): Secondary | ICD-10-CM

## 2020-08-22 DIAGNOSIS — R262 Difficulty in walking, not elsewhere classified: Secondary | ICD-10-CM | POA: Diagnosis not present

## 2020-08-22 DIAGNOSIS — R2689 Other abnormalities of gait and mobility: Secondary | ICD-10-CM

## 2020-08-22 NOTE — Therapy (Signed)
[place] MAIN [facility] SERVICES [address] [place], [place], [address] Phone: [phone]   Fax:  [phone]  Physical Therapy Treatment  Patient Details  Name: Kendra Benton MRN: [id number] Date of Birth: Apr 02, 1941 Referring Provider (PT): Dr. Sharia Reeve   Encounter Date: 08/22/2020   PT End of Session - 08/22/20 1030    Visit Number 8    Number of Visits 25    Date for PT Re-Evaluation 10/09/20    Authorization Type Traditional Medicare    Authorization Time Period 07/17/2020- 10/09/2020    PT Start Time 1025    PT Stop Time 1055    PT Time Calculation (min) 30 min    Equipment Utilized During Treatment Gait belt    Activity Tolerance Patient tolerated treatment well;No increased pain    Behavior During Therapy WFL for tasks assessed/performed           Past Medical History:  Diagnosis Date  . Allergy   . Anxiety   . GERD (gastroesophageal reflux disease)   . Headache   . History of kidney stones   . Hypertension   . Myocardial infarction ([place])    1997  . Osteoporosis   . Pneumonia     Past Surgical History:  Procedure Laterality Date  . ABDOMINAL HYSTERECTOMY     patient has ovaries  . APPENDECTOMY    . ARTERY BIOPSY Right 12/30/2017   Procedure: BIOPSY TEMPORAL ARTERY;  Surgeon: Katha Cabal, MD;  Location: [facility] ORS;  Service: Vascular;  Laterality: Right;  . ARTERY BIOPSY Left 06/18/2018   Procedure: BIOPSY TEMPORAL ARTERY;  Surgeon: Katha Cabal, MD;  Location: [facility] ORS;  Service: Vascular;  Laterality: Left;  . BACK SURGERY    . CARDIAC CATHETERIZATION    . CATARACT EXTRACTION W/ INTRAOCULAR LENS  IMPLANT, BILATERAL Bilateral 2010  . CHOLECYSTECTOMY N/A 02/28/2016   Procedure: LAPAROSCOPIC CHOLECYSTECTOMY WITH INTRAOPERATIVE CHOLANGIOGRAM;  Surgeon: Dia Crawford III, MD;  Location: [facility] ORS;  Service: General;  Laterality: N/A;  . LUMBAR FUSION  11/09/2016   L3-L5  . SPINE SURGERY     Disc  .  TONSILLECTOMY      There were no vitals filed for this visit.   Subjective Assessment - 08/22/20 1027    Subjective Doing well in general. Heard back from pain management MD who reports imaging reveals no acute fractutres (after recent falls). NO other medication or medial updates.    Pertinent History Patient reports history of chronic LBP with past lumbar fusion surgery on 11/09/2016 and most recent lumbar facet joint block procedure on 06/19/2020. She reports past medical history of spondylosis, Lung Cancer, HTN. She reports increased difficulty performing ADL's yet still independent but has paid services who assist with cleaning.    Currently in Pain? Yes    Pain Score 4           INTERVENTION: -hooklying DKTC 3x30sec -hooklying BLE rotation stretch 6x15sec bilat (ball between knees to avoid hip impingement)  -hooklying SKTC 2x30sec bilat   Progression of transverse abdominus retraining -hooklying ABD draw-in + alternate bent knee raise 1x16 -hooklying ABD draw-in + alternate bent knee drop-out and return 1x16 -hooklying bridge 1x10 (feet close to hips to reduce spinal loading)  -hooklying ABD draw-in + alternate bent knee raise 1x16 -hooklying FABER stretch with stance foot elevated to tolerance 2x30sec bilat   -STS from elevated plinth 1x10  -firm stance 4" step taps x2 minutes (LOB ~25-30% of reps, occasional minA required)  -  STS from elevated plinth 1x10  -firm stance 4" step taps x2 minutes (LOB only once; consider from airex surface next session)      PT Short Term Goals - 07/17/20 2123      PT SHORT TERM GOAL #1   Title Patient will be independent in home exercise program to improve pain relief/ strength/mobility for better functional independence with ADLs.    Baseline 07/17/2020- Patient doesn't have any formal HEP in place.    Time 6    Period Weeks    Status New    Target Date 08/28/20             PT Long Term Goals - 07/17/20 2125      PT LONG TERM GOAL  #1   Title Patient will increase FOTO score to equal to or greater than 63 to demonstrate statistically significant improvement in mobility and quality of life.    Baseline 07/17/2020: FOTO score=60    Time 12    Period Weeks    Status New    Target Date 10/09/20      PT LONG TERM GOAL #2   Title Pt will decrease worst back pain as reported on NPRS by at least 2 points in order to demonstrate clinically significant reduction in back pain.    Baseline 07/17/2020- 5/10 LBP at worst.    Time 12    Period Weeks    Status New    Target Date 10/09/20      PT LONG TERM GOAL #3   Title Patient (> 58 years old) will complete five times sit to stand test in < 18 seconds indicating an increased LE strength and improved balance.    Baseline 07/17/2020- 26.02 sec    Time 12    Period Weeks    Status New    Target Date 10/09/20      PT LONG TERM GOAL #4   Title Patient will increase Berg Balance score by > 4 points to demonstrate decreased fall risk during functional activities.    Baseline 07/17/2020- BERG = 49/56    Time 12    Period Weeks    Status New    Target Date 10/09/20      PT LONG TERM GOAL #5   Title Patient will increase 10 meter walk test to >1.37m/s as to improve gait speed for better community ambulation and to reduce fall risk.    Baseline 07/17/2020= 10MWT avg=11.0 sec (0.91 m/s) without an AD    Time 12    Period Weeks    Status New    Target Date 10/09/20                 Plan - 08/22/20 1033    Clinical Impression Statement Continued with current plan of care as laid out in evaluation and recent prior sessions. Began with back stretch, moving to abdominal retraining, then closed chang strength and balance. Pt remains motivated to advance progress toward goals. Rest breaks provided as needed, pt quick to ask when needed. Author maintains all interventions within appropriate level of intensity as not to purposefully exacerbate pain. Pt does require varying levels  of assistance and cuing for completion of exercises for correct form and sometimes due to pain/weakness. Pt continues to demonstrate progress toward goals AEB progression of some interventions this date either in volume or intensity. No updates to HEP this date.    Personal Factors and Comorbidities Age;Comorbidity 3+    Comorbidities Osteoporosis, HTN, CHF  Examination-Activity Limitations Bend;Caring for Others;Carry;Lift;Reach Overhead;Sit;Squat;Stairs;Stand    Examination-Participation Restrictions Cleaning;Community Activity;Laundry;Shop;Yard Work    [employer] Stable/Uncomplicated    Clinical Decision Making High    Rehab Potential Good    PT Frequency 2x / week    PT Duration 2 weeks    PT Treatment/Interventions ADLs/Self Care Home Management;Cryotherapy;Moist Heat;DME Instruction;Stair training;Functional mobility training;Therapeutic activities;Therapeutic exercise;Balance training;Neuromuscular re-education;Patient/family education;Manual techniques    PT Next Visit Plan LE strengthening, Low back stretching, manual therapy for pain relief,  balance training.    PT Home Exercise Plan add some core stabilization to current HEP    Consulted and Agree with Plan of Care Patient           Patient will benefit from skilled therapeutic intervention in order to improve the following deficits and impairments:  Abnormal gait,Decreased balance,Decreased coordination,Decreased activity tolerance,Cardiopulmonary status limiting activity,Decreased endurance,Decreased mobility,Decreased strength,Difficulty walking,Pain  Visit Diagnosis: Difficulty in walking, not elsewhere classified  Unsteadiness on feet  Muscle weakness (generalized)  Other abnormalities of gait and mobility     Problem List Patient Active Problem List   Diagnosis Date Noted  . Fall (on) (from) other stairs and steps, initial encounter 08/06/2020  . Lumbar facet joint syndrome 06/18/2020   . Spondylosis without myelopathy or radiculopathy, lumbosacral region 06/18/2020  . DDD (degenerative disc disease), lumbosacral 06/18/2020  . COVID 06/04/2020  . Chronic pain syndrome 05/16/2020  . Pharmacologic therapy 05/16/2020  . Disorder of skeletal system 05/16/2020  . Problems influencing health status 05/16/2020  . 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 Chronic hip pain (2ry area of Pain) (Bilateral) (R>L) 05/16/2020  . Chronic lower extremity pain (Left) 05/16/2020  . Numbness and tingling of both feet (intermittent/recurrent) 05/16/2020  . Pain in rib (Bilateral) (R>L) (intermittent) 05/16/2020  . Chronic knee pain (Left) 05/16/2020  . Patellar pain (Left) (chronic, intermittent) 05/16/2020  . Hard of hearing 05/16/2020  . Carcinoma, lung, right ([place]) 06/25/2019  . S/P partial lobectomy of lung 02/25/2019  . Mass of upper lobe of right lung 01/06/2019  . Chronic nonintractable headache 06/14/2018  . Head ache 12/03/2017  . Sinus bradycardia 10/06/2017  . Chronic low back pain (1ry area of Pain) (Bilateral) (L>R) w/o sciatica 08/17/2017  . Panic attack as reaction to stress 08/17/2017  . Anxiety 06/15/2017  . Atypical chest pain 06/15/2017  . Hyperlipidemia 06/15/2017  . Cholecystitis with cholelithiasis 02/27/2016  . Increased frequency of urination 01/27/2015  . History of night sweats 05/01/2014  . Dupuytren's contracture 07/18/2013  . Inverted nipple 04/18/2013  . Depression 04/23/2010  . Fibromyalgia 04/23/2010  . Hypercholesterolemia 04/23/2010  . Essential hypertension 04/23/2010  . Hypothyroidism 04/23/2010  . Irritable colon 04/23/2010  . Osteoarthritis 04/23/2010   10:48 AM, 08/22/20 Etta Grandchild, PT, DPT Physical Therapist - [facility]  Outpatient Physical [place] [phone]     Etta Grandchild 08/22/2020, 10:36 AM  [place] MAIN [facility] SERVICES [address] [place], [place], [address] Phone: [phone]   Fax:  [phone]  Name: Kendra Benton MRN: [id number] Date of Birth: [birth year]

## 2020-08-27 ENCOUNTER — Ambulatory Visit: Payer: Medicare Other

## 2020-08-28 ENCOUNTER — Other Ambulatory Visit: Payer: Self-pay

## 2020-08-28 ENCOUNTER — Ambulatory Visit: Payer: Medicare Other

## 2020-08-28 DIAGNOSIS — R2681 Unsteadiness on feet: Secondary | ICD-10-CM

## 2020-08-28 DIAGNOSIS — M6281 Muscle weakness (generalized): Secondary | ICD-10-CM

## 2020-08-28 DIAGNOSIS — R262 Difficulty in walking, not elsewhere classified: Secondary | ICD-10-CM

## 2020-08-28 DIAGNOSIS — R2689 Other abnormalities of gait and mobility: Secondary | ICD-10-CM

## 2020-08-28 NOTE — Therapy (Signed)
[place] MAIN [facility] SERVICES [address] [place], [place], [address] Phone: [phone]   Fax:  [phone]  Physical Therapy Treatment  Patient Details  Name: Kendra Benton MRN: [id number] Date of Birth: 12-24-1940 Referring Provider (PT): Dr. Sharia Reeve   Encounter Date: 08/28/2020   PT End of Session - 08/28/20 1713    Visit Number 9    Number of Visits 25    Date for PT Re-Evaluation 10/09/20    Authorization Type Traditional Medicare    Authorization Time Period 07/17/2020- 10/09/2020    PT Start Time 1353    PT Stop Time 1430    PT Time Calculation (min) 37 min    Equipment Utilized During Treatment Gait belt    Activity Tolerance Patient tolerated treatment well;No increased pain    Behavior During Therapy WFL for tasks assessed/performed           Past Medical History:  Diagnosis Date  . Allergy   . Anxiety   . GERD (gastroesophageal reflux disease)   . Headache   . History of kidney stones   . Hypertension   . Myocardial infarction ([place])    1997  . Osteoporosis   . Pneumonia     Past Surgical History:  Procedure Laterality Date  . ABDOMINAL HYSTERECTOMY     patient has ovaries  . APPENDECTOMY    . ARTERY BIOPSY Right 12/30/2017   Procedure: BIOPSY TEMPORAL ARTERY;  Surgeon: Katha Cabal, MD;  Location: [facility] ORS;  Service: Vascular;  Laterality: Right;  . ARTERY BIOPSY Left 06/18/2018   Procedure: BIOPSY TEMPORAL ARTERY;  Surgeon: Katha Cabal, MD;  Location: [facility] ORS;  Service: Vascular;  Laterality: Left;  . BACK SURGERY    . CARDIAC CATHETERIZATION    . CATARACT EXTRACTION W/ INTRAOCULAR LENS  IMPLANT, BILATERAL Bilateral 2010  . CHOLECYSTECTOMY N/A 02/28/2016   Procedure: LAPAROSCOPIC CHOLECYSTECTOMY WITH INTRAOPERATIVE CHOLANGIOGRAM;  Surgeon: Dia Crawford III, MD;  Location: [facility] ORS;  Service: General;  Laterality: N/A;  . LUMBAR FUSION  11/09/2016   L3-L5  . SPINE SURGERY     Disc  .  TONSILLECTOMY      There were no vitals filed for this visit.   Subjective Assessment - 08/28/20 1358    Subjective Patient reports feeling okay - had some aleve earlier and went for a massage this morning. "wishing I had waited til after therapy - kinda did things backwards today."    Pertinent History Patient reports history of chronic LBP with past lumbar fusion surgery on 11/09/2016 and most recent lumbar facet joint block procedure on 06/19/2020. She reports past medical history of spondylosis, Lung Cancer, HTN. She reports increased difficulty performing ADL's yet still independent but has paid services who assist with cleaning.    Currently in Pain? No/denies                 Procedure: BIOPSY TEMPORAL ARTERY;  Surgeon: Katha Cabal, MD;  Location: [facility] ORS;  Service: Vascular;  Laterality: Left;  . BACK SURGERY    . CARDIAC CATHETERIZATION    . CATARACT EXTRACTION W/ INTRAOCULAR LENS  IMPLANT, BILATERAL Bilateral 2010  . CHOLECYSTECTOMY N/A 02/28/2016   Procedure: LAPAROSCOPIC CHOLECYSTECTOMY WITH INTRAOPERATIVE CHOLANGIOGRAM;  Surgeon: Dia Crawford III, MD;  Location: [facility] ORS;  Service: General;  Laterality: N/A;  . LUMBAR FUSION  11/09/2016   L3-L5  . SPINE SURGERY     Disc  . TONSILLECTOMY      There  were no vitals filed for this visit.   Subjective Assessment - 08/22/20 1027    Subjective Doing well in general. Heard back from pain management MD who reports imaging reveals no acute fractutres (after recent falls). NO other medication or medial updates.    Pertinent History Patient reports history of chronic LBP with past lumbar fusion surgery on 11/09/2016 and most recent lumbar facet joint block procedure on 06/19/2020. She reports past medical history of spondylosis, Lung Cancer, HTN. She reports increased difficulty performing ADL's yet still independent but has paid services who assist with cleaning.    Currently in Pain? Yes    Pain Score 4           Therapeutic  exercises:   -hooklying DKTC 3x30sec -hooklying BLE rotation stretch 6x15sec bilat  -Pirifomis stretch- supine- hold 20 sec x 2 BLE   Progression of transverse abdominus retraining -hooklying ABD draw-in + alternate bent knee raise 1x16 -hooklying ABD draw-in + alternate bent knee (hip ER) (butterfly) x 10 -hooklying bridge 1x10  - eccentric  - Quadraped- 1/2 birdog (alt UEs) x 5 into childs pose (hold 20 sec)- *patient experienced increased shortness of   -Quadraped- 1/2 birdog (Alt LEs) x 5 reps then into childs pose (hold 20 sec)    Static balance at bar:  Feet apart - EO x 20 sec and EC x 20 sec (no unsteadiness) Feet together-EO x 20 sec x 2 sets and EC x 20 sec x 2 sets (mild unsteadiness with both yet more sway and occasional loss of balance with EC- posterior lean/sway) Feet staggered- EO x 20 sec x 2 - increased unsteadiness so did not attempt EC today.   Education provided throughout session via VC/TC and demonstration to facilitate movement at target joints and correct muscle activation for all testing and exercises performed.  Clinical impression. Patient able to perform core stabilization exercises and progression into quadruped well today without report of pain. She was challenged with balance activities and more difficulty with feet narrowed/staggered and all activities with eyes closed. She will continue to benefit from skilled PT services to improve her overall pain and progress toward safe mobility in home and community with decreased risk of falling                      PT Education - 08/28/20 1712    Education Details core stabilization and Low back stretching education    Person(s) Educated Patient    Methods Explanation;Demonstration;Tactile cues;Verbal cues    Comprehension Verbalized understanding;Tactile cues required;Returned demonstration;Need further instruction;Verbal cues required            PT Short Term Goals - 07/17/20 2123       PT SHORT TERM GOAL #1   Title Patient will be independent in home exercise program to improve pain relief/ strength/mobility for better functional independence with ADLs.    Baseline 07/17/2020- Patient doesn't have any formal HEP in place.    Time 6    Period Weeks    Status New    Target Date 08/28/20             PT Long Term Goals - 07/17/20 2125      PT LONG TERM GOAL #1   Title Patient will increase FOTO score to equal to or greater than 63 to demonstrate statistically significant improvement in mobility and quality of life.    Baseline 07/17/2020: FOTO score=60    Time 12    Period  Weeks    Status New    Target Date 10/09/20      PT LONG TERM GOAL #2   Title Pt will decrease worst back pain as reported on NPRS by at least 2 points in order to demonstrate clinically significant reduction in back pain.    Baseline 07/17/2020- 5/10 LBP at worst.    Time 12    Period Weeks    Status New    Target Date 10/09/20      PT LONG TERM GOAL #3   Title Patient (> 17 years old) will complete five times sit to stand test in < 18 seconds indicating an increased LE strength and improved balance.    Baseline 07/17/2020- 26.02 sec    Time 12    Period Weeks    Status New    Target Date 10/09/20      PT LONG TERM GOAL #4   Title Patient will increase Berg Balance score by > 4 points to demonstrate decreased fall risk during functional activities.    Baseline 07/17/2020- BERG = 49/56    Time 12    Period Weeks    Status New    Target Date 10/09/20      PT LONG TERM GOAL #5   Title Patient will increase 10 meter walk test to >1.71m/s as to improve gait speed for better community ambulation and to reduce fall risk.    Baseline 07/17/2020= 10MWT avg=11.0 sec (0.91 m/s) without an AD    Time 12    Period Weeks    Status New    Target Date 10/09/20                 Plan - 08/28/20 1713    Clinical Impression Statement Patient able to perform core stabilization exercises and  progression into quadruped well today without report of pain. She was challenged with balance activities and more difficulty with feet narrowed/staggered and all activities with eyes closed. She will continue to benefit from skilled PT services to improve her overall pain and progress toward safe mobility in home and community with decreased risk of falling    Personal Factors and Comorbidities Age;Comorbidity 3+    Comorbidities Osteoporosis, HTN, CHF    Examination-Activity Limitations Bend;Caring for Others;Carry;Lift;Reach Overhead;Sit;Squat;Stairs;Stand    Examination-Participation Restrictions Cleaning;Community Activity;Laundry;Shop;Yard Work    Stability/Clinical Decision Making Stable/Uncomplicated    Rehab Potential Good    PT Frequency 2x / week    PT Duration 2 weeks    PT Treatment/Interventions ADLs/Self Care Home Management;Cryotherapy;Moist Heat;DME Instruction;Stair training;Functional mobility training;Therapeutic activities;Therapeutic exercise;Balance training;Neuromuscular re-education;Patient/family education;Manual techniques    PT Next Visit Plan LE strengthening, Low back stretching, manual therapy for pain relief,  balance training.    PT Home Exercise Plan no changes today.    Consulted and Agree with Plan of Care Patient           Patient will benefit from skilled therapeutic intervention in order to improve the following deficits and impairments:  Abnormal gait,Decreased balance,Decreased coordination,Decreased activity tolerance,Cardiopulmonary status limiting activity,Decreased endurance,Decreased mobility,Decreased strength,Difficulty walking,Pain  Visit Diagnosis: Difficulty in walking, not elsewhere classified  Unsteadiness on feet  Muscle weakness (generalized)  Other abnormalities of gait and mobility     Problem List Patient Active Problem List   Diagnosis Date Noted  . Fall (on) (from) other stairs and steps, initial encounter 08/06/2020  .  Lumbar facet joint syndrome 06/18/2020  . Spondylosis without myelopathy or radiculopathy, lumbosacral region 06/18/2020  . DDD (  degenerative disc disease), lumbosacral 06/18/2020  . COVID 06/04/2020  . Chronic pain syndrome 05/16/2020  . Pharmacologic therapy 05/16/2020  . Disorder of skeletal system 05/16/2020  . Problems influencing health status 05/16/2020  . 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 Chronic hip pain (2ry area of Pain) (Bilateral) (R>L) 05/16/2020  . Chronic lower extremity pain (Left) 05/16/2020  . Numbness and tingling of both feet (intermittent/recurrent) 05/16/2020  . Pain in rib (Bilateral) (R>L) (intermittent) 05/16/2020  . Chronic knee pain (Left) 05/16/2020  . Patellar pain (Left) (chronic, intermittent) 05/16/2020  . Hard of hearing 05/16/2020  . Carcinoma, lung, right ([place]) 06/25/2019  . S/P partial lobectomy of lung 02/25/2019  . Mass of upper lobe of right lung 01/06/2019  . Chronic nonintractable headache 06/14/2018  . Head ache 12/03/2017  . Sinus bradycardia 10/06/2017  . Chronic low back pain (1ry area of Pain) (Bilateral) (L>R) w/o sciatica 08/17/2017  . Panic attack as reaction to stress 08/17/2017  . Anxiety 06/15/2017  . Atypical chest pain 06/15/2017  . Hyperlipidemia 06/15/2017  . Cholecystitis with cholelithiasis 02/27/2016  . Increased frequency of urination 01/27/2015  . History of night sweats 05/01/2014  . Dupuytren's contracture 07/18/2013  . Inverted nipple 04/18/2013  . Depression 04/23/2010  . Fibromyalgia 04/23/2010  . Hypercholesterolemia 04/23/2010  . Essential hypertension 04/23/2010  . Hypothyroidism 04/23/2010  . Irritable colon 04/23/2010  . Osteoarthritis 04/23/2010    Lewis Moccasin, PT 08/28/2020, 5:20 PM  [place] MAIN [facility] SERVICES [address] [place], [place], [address] Phone: [phone]   Fax:  [phone]  Name: Kendra Benton MRN: [id number] Date  of Birth: [birth year]

## 2020-08-29 ENCOUNTER — Other Ambulatory Visit: Payer: Self-pay

## 2020-08-29 ENCOUNTER — Ambulatory Visit: Payer: Medicare Other

## 2020-08-29 DIAGNOSIS — M6281 Muscle weakness (generalized): Secondary | ICD-10-CM

## 2020-08-29 DIAGNOSIS — R2681 Unsteadiness on feet: Secondary | ICD-10-CM

## 2020-08-29 DIAGNOSIS — R262 Difficulty in walking, not elsewhere classified: Secondary | ICD-10-CM

## 2020-08-29 DIAGNOSIS — R2689 Other abnormalities of gait and mobility: Secondary | ICD-10-CM

## 2020-08-29 NOTE — Therapy (Signed)
[place] MAIN [facility] SERVICES [place], [place], [address] Phone: [phone]   Fax:  [phone]  Physical Therapy Treatment Physical Therapy Progress Note   Dates of reporting period  07/17/20   to   08/29/20   Patient Details  Name: Kendra Benton MRN: [id number] Date of Birth: 18-May-1941 Referring Provider (PT): Dr. Sharia Reeve   Encounter Date: 08/29/2020   PT End of Session - 08/29/20 1221    Visit Number 10    Number of Visits 25    Date for PT Re-Evaluation 10/09/20    Authorization Type Traditional Medicare    Authorization Time Period 07/17/2020- 10/09/2020    PT Start Time 1031    PT Stop Time 1111    PT Time Calculation (min) 40 min    Activity Tolerance Patient tolerated treatment well;No increased pain    Behavior During Therapy WFL for tasks assessed/performed           Past Medical History:  Diagnosis Date  . Allergy   . Anxiety   . GERD (gastroesophageal reflux disease)   . Headache   . History of kidney stones   . Hypertension   . Myocardial infarction ([place])    1997  . Osteoporosis   . Pneumonia     Past Surgical History:  Procedure Laterality Date  . ABDOMINAL HYSTERECTOMY     patient has ovaries  . APPENDECTOMY    . ARTERY BIOPSY Right 12/30/2017   Procedure: BIOPSY TEMPORAL ARTERY;  Surgeon: Katha Cabal, MD;  Location: [facility] ORS;  Service: Vascular;  Laterality: Right;  . ARTERY BIOPSY Left 06/18/2018   Procedure: BIOPSY TEMPORAL ARTERY;  Surgeon: Katha Cabal, MD;  Location: [facility] ORS;  Service: Vascular;  Laterality: Left;  . BACK SURGERY    . CARDIAC CATHETERIZATION    . CATARACT EXTRACTION W/ INTRAOCULAR LENS  IMPLANT, BILATERAL Bilateral 2010  . CHOLECYSTECTOMY N/A 02/28/2016   Procedure: LAPAROSCOPIC CHOLECYSTECTOMY WITH INTRAOPERATIVE CHOLANGIOGRAM;  Surgeon: Dia Crawford III, MD;  Location: [facility] ORS;  Service: General;  Laterality: N/A;  . LUMBAR FUSION  11/09/2016   L3-L5   . SPINE SURGERY     Disc  . TONSILLECTOMY      There were no vitals filed for this visit.   Subjective Assessment - 08/29/20 1040    Subjective Pt doing well today. Was here at PT yesterday. Pt reports left hip pain is flared mildly. She has decided to stop taking her ultram anymore due to the side-effects.    Pertinent History Patient reports history of chronic LBP with past lumbar fusion surgery on 11/09/2016 and most recent lumbar facet joint block procedure on 06/19/2020. She reports past medical history of spondylosis, Lung Cancer, HTN. She reports increased difficulty performing ADL's yet still independent but has paid services who assist with cleaning.    Currently in Pain? Yes    Pain Score 2               [facility] PT Assessment - 08/29/20 0001      Berg Balance Test   Sit to Stand Able to stand without using hands and stabilize independently    Standing Unsupported Able to stand safely 2 minutes    Sitting with Back Unsupported but Feet Supported on Floor or Stool Able to sit safely and securely 2 minutes    Stand to Sit Sits safely with minimal use of hands    Transfers Able to transfer safely, minor use of  hands    Standing Unsupported with Eyes Closed Able to stand 10 seconds safely    Standing Unsupported with Feet Together Able to place feet together independently and stand 1 minute safely    From Standing, Reach Forward with Outstretched Arm Can reach confidently >25 cm (10")    From Standing Position, Pick up Object from Floor Able to pick up shoe safely and easily    From Standing Position, Turn to Look Behind Over each Shoulder Turn sideways only but maintains balance   no LOB, but pt not medically allowed to twist   Turn 360 Degrees Able to turn 360 degrees safely in 4 seconds or less    Standing Unsupported, Alternately Place Feet on Step/Stool Able to stand independently and complete 8 steps >20 seconds   1 LOB, but no assist needed   Standing Unsupported, One Foot  in Front Able to plae foot ahead of the other independently and hold 30 seconds    Standing on One Leg Able to lift leg independently and hold equal to or more than 3 seconds    Total Score 50          INTERVENTION THIS DATE: MFR to Left gluteus medius x15 minutes, excellent resolution of tenderness, only tolerates mild pressure from author  -5xSTS: 14sec -10MWT (self selected, fastest; no device)  -SLS: ~3-4sec left, 5-6sec Rt (multiple false starts)  SLS: 10x5secH bilat, alternating sides      PT Short Term Goals - 07/17/20 2123      PT SHORT TERM GOAL #1   Title Patient will be independent in home exercise program to improve pain relief/ strength/mobility for better functional independence with ADLs.    Baseline 07/17/2020- Patient doesn't have any formal HEP in place.    Time 6    Period Weeks    Status New    Target Date 08/28/20             PT Long Term Goals - 08/29/20 1043      PT LONG TERM GOAL #1   Title Patient will increase FOTO score to equal to or greater than 63 to demonstrate statistically significant improvement in mobility and quality of life.    Baseline 07/17/2020: FOTO score=60    Time 12    Period Weeks    Status On-going    Target Date 10/09/20      PT LONG TERM GOAL #2   Title Pt will decrease worst back pain as reported on NPRS by at least 2 points in order to demonstrate clinically significant reduction in back pain.    Baseline 07/17/2020- 5/10 LBP at worst; 08/29/20: reaches around a 7/10 almost daily in the past week.    Time 12    Period Weeks    Status On-going    Target Date 10/09/20      PT LONG TERM GOAL #3   Title Patient (> 28 years old) will complete five times sit to stand test in <13 seconds indicating an increased LE strength and improved balance.   Previously was 18sec goal   Baseline 07/17/2020- 26.02 sec; 08/29/20: 14.34sec    Time 12    Period Weeks    Status Revised    Target Date 10/09/20      PT LONG TERM GOAL #4    Title Patient will increase Berg Balance score by > 4 points to demonstrate decreased fall risk during functional activities.   Will update to either a SLS goal or a  DGI/FGA goal next session.   Baseline 07/17/2020- BERG = 49/56; 08/29/20: 50/56 (will convert to new balance goal, as pt cannot improve any more (statistically) due to ceiling effect and medical restrictions)    Time 12    Period Weeks    Status Revised    Target Date 10/09/20      PT LONG TERM GOAL #5   Title Patient will increase 10 meter walk test to >1.59m/s as to improve gait speed for better community ambulation and to reduce fall risk.    Baseline 07/17/2020= 10MWT avg=11.0 sec (0.91 m/s) without an AD; 08/29/20: 1.38m/s self selected, 1.77m/s;    Time 12    Period Weeks    Status Achieved    Target Date 10/09/20                 Plan - 08/29/20 1222    Clinical Impression Statement Reassessment adn 10th visit note this date: pt showing great improvement with 10MWT (at goal), HEP independence (at goal), and 5xSTS (at goal), however not meeting other LT goals. BBT is no longer statistically meaningful to show pt's progress, nor does it speak to remaining deficits, perceived safety issues, or room for improvement- balanc egoal to be updated next session. Favorable response to MFR to Left gluteus medius for resolving pain. Finished with SLS balance training as well as discussing importance to using hands-free righting/recovery strategies during SLS practice in gym to maximize balance training AND falls recovery. Excellent progress toward goals in general. As back pain is much improved, will need to modify intervention time in coming sessions to increase balance training time over other concurrent areas of intervention. Pt is most motivated to improve her balance at this point, moreso than her back pain limitations which are at least predicatable to some degree.    Personal Factors and Comorbidities Age;Comorbidity 3+     Comorbidities Osteoporosis, HTN, CHF    Examination-Activity Limitations Bend;Caring for Others;Carry;Lift;Reach Overhead;Sit;Squat;Stairs;Stand    Examination-Participation Restrictions Cleaning;Community Activity;Laundry;Shop;Yard Work    [employer] Stable/Uncomplicated    Clinical Decision Making High    Rehab Potential Good    PT Frequency 2x / week    PT Duration 2 weeks    PT Treatment/Interventions ADLs/Self Care Home Management;Cryotherapy;Moist Heat;DME Instruction;Stair training;Functional mobility training;Therapeutic activities;Therapeutic exercise;Balance training;Neuromuscular re-education;Patient/family education;Manual techniques    PT Next Visit Plan LE strengthening, Low back stretching, manual therapy for pain relief,  balance training.    PT Home Exercise Plan added SLS balance training at home    Consulted and Agree with Plan of Care Patient           Patient will benefit from skilled therapeutic intervention in order to improve the following deficits and impairments:  Abnormal gait,Decreased balance,Decreased coordination,Decreased activity tolerance,Cardiopulmonary status limiting activity,Decreased endurance,Decreased mobility,Decreased strength,Difficulty walking,Pain  Visit Diagnosis: Difficulty in walking, not elsewhere classified  Unsteadiness on feet  Muscle weakness (generalized)  Other abnormalities of gait and mobility     Problem List Patient Active Problem List   Diagnosis Date Noted  . Fall (on) (from) other stairs and steps, initial encounter 08/06/2020  . Lumbar facet joint syndrome 06/18/2020  . Spondylosis without myelopathy or radiculopathy, lumbosacral region 06/18/2020  . DDD (degenerative disc disease), lumbosacral 06/18/2020  . COVID 06/04/2020  . Chronic pain syndrome 05/16/2020  . Pharmacologic therapy 05/16/2020  . Disorder of skeletal system 05/16/2020  . Problems influencing health status 05/16/2020  .  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 Chronic hip  pain (2ry area of Pain) (Bilateral) (R>L) 05/16/2020  . Chronic lower extremity pain (Left) 05/16/2020  . Numbness and tingling of both feet (intermittent/recurrent) 05/16/2020  . Pain in rib (Bilateral) (R>L) (intermittent) 05/16/2020  . Chronic knee pain (Left) 05/16/2020  . Patellar pain (Left) (chronic, intermittent) 05/16/2020  . Hard of hearing 05/16/2020  . Carcinoma, lung, right ([place]) 06/25/2019  . S/P partial lobectomy of lung 02/25/2019  . Mass of upper lobe of right lung 01/06/2019  . Chronic nonintractable headache 06/14/2018  . Head ache 12/03/2017  . Sinus bradycardia 10/06/2017  . Chronic low back pain (1ry area of Pain) (Bilateral) (L>R) w/o sciatica 08/17/2017  . Panic attack as reaction to stress 08/17/2017  . Anxiety 06/15/2017  . Atypical chest pain 06/15/2017  . Hyperlipidemia 06/15/2017  . Cholecystitis with cholelithiasis 02/27/2016  . Increased frequency of urination 01/27/2015  . History of night sweats 05/01/2014  . Dupuytren's contracture 07/18/2013  . Inverted nipple 04/18/2013  . Depression 04/23/2010  . Fibromyalgia 04/23/2010  . Hypercholesterolemia 04/23/2010  . Essential hypertension 04/23/2010  . Hypothyroidism 04/23/2010  . Irritable colon 04/23/2010  . Osteoarthritis 04/23/2010   12:47 PM, 08/29/20 Etta Grandchild, PT, DPT Physical Therapist - [place] [phone]     Etta Grandchild 08/29/2020, 12:44 PM  [place] MAIN [facility] SERVICES [address] [place], [place], [address] Phone: [phone]   Fax:  [phone]  Name: Kendra Benton MRN: [id number] Date of Birth: [birth year]

## 2020-09-03 ENCOUNTER — Other Ambulatory Visit: Payer: Self-pay

## 2020-09-03 ENCOUNTER — Ambulatory Visit: Payer: Medicare Other

## 2020-09-03 DIAGNOSIS — M6281 Muscle weakness (generalized): Secondary | ICD-10-CM

## 2020-09-03 DIAGNOSIS — R2689 Other abnormalities of gait and mobility: Secondary | ICD-10-CM

## 2020-09-03 DIAGNOSIS — R2681 Unsteadiness on feet: Secondary | ICD-10-CM

## 2020-09-03 DIAGNOSIS — R262 Difficulty in walking, not elsewhere classified: Secondary | ICD-10-CM | POA: Diagnosis not present

## 2020-09-03 NOTE — Therapy (Signed)
[place] MAIN [facility] SERVICES [place], [place], [address] Phone: [phone]   Fax:  [phone]  Physical Therapy Treatment  Patient Details  Name: Kendra Benton MRN: [id number] Date of Birth: 1941-06-07 Referring Provider (PT): Dr. Sharia Reeve   Encounter Date: 09/03/2020   PT End of Session - 09/03/20 1024    Visit Number 11    Number of Visits 25    Date for PT Re-Evaluation 10/09/20    Authorization Type Traditional Medicare    Authorization Time Period 07/17/2020- 10/09/2020    PT Start Time 0945    PT Stop Time 1028    PT Time Calculation (min) 43 min    Equipment Utilized During Treatment Gait belt    Activity Tolerance Patient tolerated treatment well;No increased pain    Behavior During Therapy WFL for tasks assessed/performed           Past Medical History:  Diagnosis Date  . Allergy   . Anxiety   . GERD (gastroesophageal reflux disease)   . Headache   . History of kidney stones   . Hypertension   . Myocardial infarction ([place])    1997  . Osteoporosis   . Pneumonia     Past Surgical History:  Procedure Laterality Date  . ABDOMINAL HYSTERECTOMY     patient has ovaries  . APPENDECTOMY    . ARTERY BIOPSY Right 12/30/2017   Procedure: BIOPSY TEMPORAL ARTERY;  Surgeon: Katha Cabal, MD;  Location: [facility] ORS;  Service: Vascular;  Laterality: Right;  . ARTERY BIOPSY Left 06/18/2018   Procedure: BIOPSY TEMPORAL ARTERY;  Surgeon: Katha Cabal, MD;  Location: [facility] ORS;  Service: Vascular;  Laterality: Left;  . BACK SURGERY    . CARDIAC CATHETERIZATION    . CATARACT EXTRACTION W/ INTRAOCULAR LENS  IMPLANT, BILATERAL Bilateral 2010  . CHOLECYSTECTOMY N/A 02/28/2016   Procedure: LAPAROSCOPIC CHOLECYSTECTOMY WITH INTRAOPERATIVE CHOLANGIOGRAM;  Surgeon: Dia Crawford III, MD;  Location: [facility] ORS;  Service: General;  Laterality: N/A;  . LUMBAR FUSION  11/09/2016   L3-L5  . SPINE SURGERY     Disc  .  TONSILLECTOMY      Seated BP: 147/61 mmHg, 74 bpm Standing BP: 126/64 mmHg, 74 bpm ** Constant c/o moderate dizziness, no change in severity with positional changes  Interventions:  Subjective Assessment - 09/03/20 0951    Subjective Pt with c/o moderate dizziness today. Continues to have minimal LBP. Started walking again; states it's going well but she doesn't know how long/far she walks. Reports she's been tapering off gabapentin per MD request.    Pertinent History Patient reports history of chronic LBP with past lumbar fusion surgery on 11/09/2016 and most recent lumbar facet joint block procedure on 06/19/2020. She reports past medical history of spondylosis, Lung Cancer, HTN. She reports increased difficulty performing ADL's yet still independent but has paid services who assist with cleaning.    Currently in Pain? Yes    Pain Score 1     Pain Location Back    Pain Descriptors / Indicators Aching    Pain Type Chronic pain           Nu-step  lvl 2, seat 9 SPM >50   Standing in // bars:  Standing with CGA next to support surface:  Airex pad: static stand 30 seconds x 3 trials, increased multidirectional sway, able to correct with cues for corrective weight shift Airex pad: horizontal head turns 2x 30 seconds scanning room Airex  pad: vertical head turns 2x 30 seconds, noticeable anterior sway with downward gaze increasing demand on ankle righting reaction musculature Airex pad: one foot on 6" step one foot on airex pad, hold position for 60 seconds, switch legs, 2x each LE; increased difficulty L>R   Standing with #2lb ankle weight -4" step toe taps 12x each LE , 2 finger support -hip extension 10x each LE, BUE support -side step 4x length of // bars   With 2lb ankle weight:  -LAQ 10x 3 second holds  -alternating ER/IR 10x each LE with green ball between knees     Pt tolerated treatment well today. Treatment continues to focus of NMR and muscular endurance for decreased risk  of falls. Treatment not progressed as pt has increased walking at home (3x/wk), and presents with increased leg shaking with fatigue. Orthostatic BP noted during session, but states MD has recently changed BP medications. No increased c/o dizziness with vertical/horizontal head turns in romberg stance on airex. Improved corrective weight shift during balance ex with cues. Pt will continue to benefit from skilled PT services to address deficits for improved safety with functional mobility. Will continue per POC.     PT Education - 09/03/20 1023    Education Details safety with mobility, biomechanics, exercise prescription    Person(s) Educated Patient    Methods Explanation;Demonstration;Tactile cues;Verbal cues    Comprehension Verbalized understanding;Returned demonstration;Tactile cues required;Verbal cues required            PT Short Term Goals - 07/17/20 2123      PT SHORT TERM GOAL #1   Title Patient will be independent in home exercise program to improve pain relief/ strength/mobility for better functional independence with ADLs.    Baseline 07/17/2020- Patient doesn't have any formal HEP in place.    Time 6    Period Weeks    Status New    Target Date 08/28/20             PT Long Term Goals - 08/29/20 1043      PT LONG TERM GOAL #1   Title Patient will increase FOTO score to equal to or greater than 63 to demonstrate statistically significant improvement in mobility and quality of life.    Baseline 07/17/2020: FOTO score=60    Time 12    Period Weeks    Status On-going    Target Date 10/09/20      PT LONG TERM GOAL #2   Title Pt will decrease worst back pain as reported on NPRS by at least 2 points in order to demonstrate clinically significant reduction in back pain.    Baseline 07/17/2020- 5/10 LBP at worst; 08/29/20: reaches around a 7/10 almost daily in the past week.    Time 12    Period Weeks    Status On-going    Target Date 10/09/20      PT LONG TERM GOAL  #3   Title Patient (> 82 years old) will complete five times sit to stand test in <13 seconds indicating an increased LE strength and improved balance.   Previously was 18sec goal   Baseline 07/17/2020- 26.02 sec; 08/29/20: 14.34sec    Time 12    Period Weeks    Status Revised    Target Date 10/09/20      PT LONG TERM GOAL #4   Title Patient will increase Berg Balance score by > 4 points to demonstrate decreased fall risk during functional activities.   Will update to either  a SLS goal or a DGI/FGA goal next session.   Baseline 07/17/2020- BERG = 49/56; 08/29/20: 50/56 (will convert to new balance goal, as pt cannot improve any more (statistically) due to ceiling effect and medical restrictions)    Time 12    Period Weeks    Status Revised    Target Date 10/09/20      PT LONG TERM GOAL #5   Title Patient will increase 10 meter walk test to >1.51m/s as to improve gait speed for better community ambulation and to reduce fall risk.    Baseline 07/17/2020= 10MWT avg=11.0 sec (0.91 m/s) without an AD; 08/29/20: 1.36m/s self selected, 1.52m/s;    Time 12    Period Weeks    Status Achieved    Target Date 10/09/20                 Plan - 09/03/20 1024    Clinical Impression Statement Pt tolerated treatment well today. Treatment not progressed as pt has increased walking at home (3x/wk), and presents with increased leg shaking with fatigue. Orthostatic BP noted during session, but states MD has recently changed BP medications. No increased c/o dizziness with vertical/horizontal head turns in romberg stance on airex. Improved corrective weight shift during balance ex with cues. Pt will continue to benefit from skilled PT services to address deficits for improved safety with functional mobility. Will continue per POC.    Personal Factors and Comorbidities Age;Comorbidity 3+    Comorbidities Osteoporosis, HTN, CHF    Examination-Activity Limitations Bend;Caring for Others;Carry;Lift;Reach  Overhead;Sit;Squat;Stairs;Stand    Examination-Participation Restrictions Cleaning;Community Activity;Laundry;Shop;Yard Work    Stability/Clinical Decision Making Stable/Uncomplicated    Rehab Potential Good    PT Frequency 2x / week    PT Duration 2 weeks    PT Treatment/Interventions ADLs/Self Care Home Management;Cryotherapy;Moist Heat;DME Instruction;Stair training;Functional mobility training;Therapeutic activities;Therapeutic exercise;Balance training;Neuromuscular re-education;Patient/family education;Manual techniques    PT Next Visit Plan LE strengthening, Low back stretching, manual therapy for pain relief,  balance training.    PT Home Exercise Plan added SLS balance training at home    Consulted and Agree with Plan of Care Patient           Patient will benefit from skilled therapeutic intervention in order to improve the following deficits and impairments:  Abnormal gait,Decreased balance,Decreased coordination,Decreased activity tolerance,Cardiopulmonary status limiting activity,Decreased endurance,Decreased mobility,Decreased strength,Difficulty walking,Pain  Visit Diagnosis: Difficulty in walking, not elsewhere classified  Unsteadiness on feet  Muscle weakness (generalized)  Other abnormalities of gait and mobility     Problem List Patient Active Problem List   Diagnosis Date Noted  . Fall (on) (from) other stairs and steps, initial encounter 08/06/2020  . Lumbar facet joint syndrome 06/18/2020  . Spondylosis without myelopathy or radiculopathy, lumbosacral region 06/18/2020  . DDD (degenerative disc disease), lumbosacral 06/18/2020  . COVID 06/04/2020  . Chronic pain syndrome 05/16/2020  . Pharmacologic therapy 05/16/2020  . Disorder of skeletal system 05/16/2020  . Problems influencing health status 05/16/2020  . 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 Chronic hip pain (2ry area of Pain) (Bilateral) (R>L) 05/16/2020  . Chronic lower extremity pain  (Left) 05/16/2020  . Numbness and tingling of both feet (intermittent/recurrent) 05/16/2020  . Pain in rib (Bilateral) (R>L) (intermittent) 05/16/2020  . Chronic knee pain (Left) 05/16/2020  . Patellar pain (Left) (chronic, intermittent) 05/16/2020  . Hard of hearing 05/16/2020  . Carcinoma, lung, right ([place]) 06/25/2019  . S/P partial lobectomy of lung 02/25/2019  . Mass of upper  lobe of right lung 01/06/2019  . Chronic nonintractable headache 06/14/2018  . Head ache 12/03/2017  . Sinus bradycardia 10/06/2017  . Chronic low back pain (1ry area of Pain) (Bilateral) (L>R) w/o sciatica 08/17/2017  . Panic attack as reaction to stress 08/17/2017  . Anxiety 06/15/2017  . Atypical chest pain 06/15/2017  . Hyperlipidemia 06/15/2017  . Cholecystitis with cholelithiasis 02/27/2016  . Increased frequency of urination 01/27/2015  . History of night sweats 05/01/2014  . Dupuytren's contracture 07/18/2013  . Inverted nipple 04/18/2013  . Depression 04/23/2010  . Fibromyalgia 04/23/2010  . Hypercholesterolemia 04/23/2010  . Essential hypertension 04/23/2010  . Hypothyroidism 04/23/2010  . Irritable colon 04/23/2010  . Osteoarthritis 04/23/2010    Herminio Commons 09/03/2020, 11:06 AM  [place] MAIN [facility] SERVICES [address] [place], [place], [address] Phone: [phone]   Fax:  [phone]  Name: GERALDINA PARROTT MRN: [id number] Date of Birth: Oct 17, 1940

## 2020-09-05 ENCOUNTER — Ambulatory Visit: Payer: Medicare Other

## 2020-09-05 ENCOUNTER — Other Ambulatory Visit: Payer: Self-pay

## 2020-09-05 VITALS — BP 125/73 | HR 79

## 2020-09-05 DIAGNOSIS — R262 Difficulty in walking, not elsewhere classified: Secondary | ICD-10-CM | POA: Diagnosis not present

## 2020-09-05 DIAGNOSIS — M6281 Muscle weakness (generalized): Secondary | ICD-10-CM

## 2020-09-05 DIAGNOSIS — R2681 Unsteadiness on feet: Secondary | ICD-10-CM

## 2020-09-05 DIAGNOSIS — R2689 Other abnormalities of gait and mobility: Secondary | ICD-10-CM

## 2020-09-05 NOTE — Therapy (Signed)
[place] MAIN [facility] SERVICES [place], [place], [address] Phone: [phone]   Fax:  [phone]  Physical Therapy Treatment  Patient Details  Name: Kendra Benton MRN: [id number] Date of Birth: July 24, 1940 Referring Provider (PT): Dr. Sharia Reeve   Encounter Date: 09/05/2020   PT End of Session - 09/05/20 1120    Visit Number 12    Number of Visits 25    Date for PT Re-Evaluation 10/09/20    Authorization Type Traditional Medicare    Authorization Time Period 07/17/2020- 10/09/2020    PT Start Time 1032    PT Stop Time 1115    PT Time Calculation (min) 43 min    Equipment Utilized During Treatment Gait belt    Activity Tolerance Patient tolerated treatment well;No increased pain    Behavior During Therapy WFL for tasks assessed/performed           Past Medical History:  Diagnosis Date  . Allergy   . Anxiety   . GERD (gastroesophageal reflux disease)   . Headache   . History of kidney stones   . Hypertension   . Myocardial infarction ([place])    1997  . Osteoporosis   . Pneumonia     Past Surgical History:  Procedure Laterality Date  . ABDOMINAL HYSTERECTOMY     patient has ovaries  . APPENDECTOMY    . ARTERY BIOPSY Right 12/30/2017   Procedure: BIOPSY TEMPORAL ARTERY;  Surgeon: Katha Cabal, MD;  Location: [facility] ORS;  Service: Vascular;  Laterality: Right;  . ARTERY BIOPSY Left 06/18/2018   Procedure: BIOPSY TEMPORAL ARTERY;  Surgeon: Katha Cabal, MD;  Location: [facility] ORS;  Service: Vascular;  Laterality: Left;  . BACK SURGERY    . CARDIAC CATHETERIZATION    . CATARACT EXTRACTION W/ INTRAOCULAR LENS  IMPLANT, BILATERAL Bilateral 2010  . CHOLECYSTECTOMY N/A 02/28/2016   Procedure: LAPAROSCOPIC CHOLECYSTECTOMY WITH INTRAOPERATIVE CHOLANGIOGRAM;  Surgeon: Dia Crawford III, MD;  Location: [facility] ORS;  Service: General;  Laterality: N/A;  . LUMBAR FUSION  11/09/2016   L3-L5  . SPINE SURGERY     Disc  .  TONSILLECTOMY      Vitals:   09/05/20 1037  BP: 125/73  Pulse: 79     Subjective Assessment - 09/05/20 1037    Subjective Pt continues to present with c/o moderate dizziness today, which she states her and her PCP believe to be due to tapering off of multiple medications. No falls since last visit. Minimal LBP with no radicular symptoms.    Pertinent History Patient reports history of chronic LBP with past lumbar fusion surgery on 11/09/2016 and most recent lumbar facet joint block procedure on 06/19/2020. She reports past medical history of spondylosis, Lung Cancer, HTN. She reports increased difficulty performing ADL's yet still independent but has paid services who assist with cleaning.    Limitations Standing;Lifting;Walking;House hold activities    Currently in Pain? Yes    Pain Score 1     Pain Location Back    Pain Orientation Right    Pain Descriptors / Indicators Aching           Interventions:  Nu-step  lvl 2, seat 6 SPM >60, 4 min  Standing in // bars: - Standing with CGA next to support surface:  - Airex pad: static stand 3x30s, increased multidirectional sway with cognitive load - Airex pad: horizontal head turns 2x30s scanning room - Airex pad: vertical head turns 2x30s, noticeable anterior sway with downward  gaze increasing demand on ankle righting reaction musculature - Airex pad: one foot on 6" step one foot on airex pad, hold position for 2x60s BLE; increased difficulty L>R - Squats x10 without UE support   Standing with #2lb ankle weight: - 4" step toe taps BLE x15ea, 1 finger support - hip extension BLE x15ea, UUE support - side step 3x length of // bars, no UE support - standing marching 2x30s, 2 finger support   Seated with 2lb ankle weight:  - BLE LAQ x15ea, with 3s holds and green ball between knees - BLE alternating ER/IR x15ea with green ball between knees   Standing paloff press 5# bil, x10ea with CGA     Assessment: Pt tolerated progression of  treatment well today. Continues to present with moderate dizziness; intensity unaffected during session with BP WNL (see vitals section above). Pt demonstrates improvements in balance, requiring decreased UE support. However, increased multidirectional sway noted with increased cognitive load. Squats and paloff press incorporated into treatment today to address LE strength and proximal stability. Pt will continue to benefit from skilled OPPT services to address deficits to improve overall safety with functional mobility for improved quality of life. Will continue per POC.    PT Education - 09/05/20 1119    Education Details safety with mobility, dizziness, biomechanics, exercise prescription    Person(s) Educated Patient    Methods Explanation;Demonstration;Tactile cues;Verbal cues    Comprehension Verbalized understanding;Returned demonstration;Verbal cues required;Tactile cues required            PT Short Term Goals - 07/17/20 2123      PT SHORT TERM GOAL #1   Title Patient will be independent in home exercise program to improve pain relief/ strength/mobility for better functional independence with ADLs.    Baseline 07/17/2020- Patient doesn't have any formal HEP in place.    Time 6    Period Weeks    Status New    Target Date 08/28/20             PT Long Term Goals - 08/29/20 1043      PT LONG TERM GOAL #1   Title Patient will increase FOTO score to equal to or greater than 63 to demonstrate statistically significant improvement in mobility and quality of life.    Baseline 07/17/2020: FOTO score=60    Time 12    Period Weeks    Status On-going    Target Date 10/09/20      PT LONG TERM GOAL #2   Title Pt will decrease worst back pain as reported on NPRS by at least 2 points in order to demonstrate clinically significant reduction in back pain.    Baseline 07/17/2020- 5/10 LBP at worst; 08/29/20: reaches around a 7/10 almost daily in the past week.    Time 12    Period Weeks     Status On-going    Target Date 10/09/20      PT LONG TERM GOAL #3   Title Patient (> 59 years old) will complete five times sit to stand test in <13 seconds indicating an increased LE strength and improved balance.   Previously was 18sec goal   Baseline 07/17/2020- 26.02 sec; 08/29/20: 14.34sec    Time 12    Period Weeks    Status Revised    Target Date 10/09/20      PT LONG TERM GOAL #4   Title Patient will increase Berg Balance score by > 4 points to demonstrate decreased fall risk during  functional activities.   Will update to either a SLS goal or a DGI/FGA goal next session.   Baseline 07/17/2020- BERG = 49/56; 08/29/20: 50/56 (will convert to new balance goal, as pt cannot improve any more (statistically) due to ceiling effect and medical restrictions)    Time 12    Period Weeks    Status Revised    Target Date 10/09/20      PT LONG TERM GOAL #5   Title Patient will increase 10 meter walk test to >1.31m/s as to improve gait speed for better community ambulation and to reduce fall risk.    Baseline 07/17/2020= 10MWT avg=11.0 sec (0.91 m/s) without an AD; 08/29/20: 1.51m/s self selected, 1.40m/s;    Time 12    Period Weeks    Status Achieved    Target Date 10/09/20                 Plan - 09/05/20 1120    Clinical Impression Statement Pt tolerated progression of treatment well today. Pt demonstrates improvements in balance, requiring decreased UE support. However, increased multidirectional sway noted with increased cognitive load. Squats and paloff press incorporated into treatment today to address LE strength and proximal stability. Pt will continue to benefit from skilled OPPT services to address deficits to improve overall safety with functional mobility for improved quality of life. Will continue per POC.    Personal Factors and Comorbidities Age;Comorbidity 3+    Comorbidities Osteoporosis, HTN, CHF    Examination-Activity Limitations Bend;Caring for  Others;Carry;Lift;Reach Overhead;Sit;Squat;Stairs;Stand    Examination-Participation Restrictions Cleaning;Community Activity;Laundry;Shop;Yard Work    Stability/Clinical Decision Making Stable/Uncomplicated    Rehab Potential Good    PT Frequency 2x / week    PT Duration 2 weeks    PT Treatment/Interventions ADLs/Self Care Home Management;Cryotherapy;Moist Heat;DME Instruction;Stair training;Functional mobility training;Therapeutic activities;Therapeutic exercise;Balance training;Neuromuscular re-education;Patient/family education;Manual techniques    PT Next Visit Plan LE strengthening, Low back stretching, manual therapy for pain relief,  balance training.    PT Home Exercise Plan added SLS balance training at home    Consulted and Agree with Plan of Care Patient           Patient will benefit from skilled therapeutic intervention in order to improve the following deficits and impairments:  Abnormal gait,Decreased balance,Decreased coordination,Decreased activity tolerance,Cardiopulmonary status limiting activity,Decreased endurance,Decreased mobility,Decreased strength,Difficulty walking,Pain  Visit Diagnosis: Difficulty in walking, not elsewhere classified  Unsteadiness on feet  Muscle weakness (generalized)  Other abnormalities of gait and mobility     Problem List Patient Active Problem List   Diagnosis Date Noted  . Fall (on) (from) other stairs and steps, initial encounter 08/06/2020  . Lumbar facet joint syndrome 06/18/2020  . Spondylosis without myelopathy or radiculopathy, lumbosacral region 06/18/2020  . DDD (degenerative disc disease), lumbosacral 06/18/2020  . COVID 06/04/2020  . Chronic pain syndrome 05/16/2020  . Pharmacologic therapy 05/16/2020  . Disorder of skeletal system 05/16/2020  . Problems influencing health status 05/16/2020  . 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 Chronic hip pain (2ry area of Pain) (Bilateral) (R>L) 05/16/2020  . Chronic  lower extremity pain (Left) 05/16/2020  . Numbness and tingling of both feet (intermittent/recurrent) 05/16/2020  . Pain in rib (Bilateral) (R>L) (intermittent) 05/16/2020  . Chronic knee pain (Left) 05/16/2020  . Patellar pain (Left) (chronic, intermittent) 05/16/2020  . Hard of hearing 05/16/2020  . Carcinoma, lung, right ([place]) 06/25/2019  . S/P partial lobectomy of lung 02/25/2019  . Mass of upper lobe of right lung 01/06/2019  .  Chronic nonintractable headache 06/14/2018  . Head ache 12/03/2017  . Sinus bradycardia 10/06/2017  . Chronic low back pain (1ry area of Pain) (Bilateral) (L>R) w/o sciatica 08/17/2017  . Panic attack as reaction to stress 08/17/2017  . Anxiety 06/15/2017  . Atypical chest pain 06/15/2017  . Hyperlipidemia 06/15/2017  . Cholecystitis with cholelithiasis 02/27/2016  . Increased frequency of urination 01/27/2015  . History of night sweats 05/01/2014  . Dupuytren's contracture 07/18/2013  . Inverted nipple 04/18/2013  . Depression 04/23/2010  . Fibromyalgia 04/23/2010  . Hypercholesterolemia 04/23/2010  . Essential hypertension 04/23/2010  . Hypothyroidism 04/23/2010  . Irritable colon 04/23/2010  . Osteoarthritis 04/23/2010   Herminio Commons, PT, DPT 11:23 AM,09/05/20  [place] MAIN [facility] SERVICES [address] [place], [place], [address] Phone: [phone]   Fax:  [phone]  Name: Kendra Benton MRN: [id number] Date of Birth: 1941/01/01

## 2020-09-11 ENCOUNTER — Other Ambulatory Visit: Payer: Self-pay

## 2020-09-11 ENCOUNTER — Ambulatory Visit: Payer: Medicare Other | Attending: Internal Medicine

## 2020-09-11 DIAGNOSIS — R2681 Unsteadiness on feet: Secondary | ICD-10-CM | POA: Insufficient documentation

## 2020-09-11 DIAGNOSIS — R2689 Other abnormalities of gait and mobility: Secondary | ICD-10-CM | POA: Insufficient documentation

## 2020-09-11 DIAGNOSIS — R269 Unspecified abnormalities of gait and mobility: Secondary | ICD-10-CM | POA: Diagnosis present

## 2020-09-11 DIAGNOSIS — R262 Difficulty in walking, not elsewhere classified: Secondary | ICD-10-CM | POA: Diagnosis present

## 2020-09-11 DIAGNOSIS — R278 Other lack of coordination: Secondary | ICD-10-CM | POA: Insufficient documentation

## 2020-09-11 DIAGNOSIS — M6281 Muscle weakness (generalized): Secondary | ICD-10-CM | POA: Insufficient documentation

## 2020-09-11 NOTE — Therapy (Signed)
[place] MAIN [facility] SERVICES [place], [place], [address] Phone: [phone]   Fax:  [phone]  Physical Therapy Treatment  Patient Details  Name: Kendra Benton MRN: [id number] Date of Birth: [birth year] Referring Provider (PT): Dr. Sharia Reeve   Encounter Date: 09/11/2020   PT End of Session - 09/11/20 1332    Visit Number 13    Number of Visits 25    Date for PT Re-Evaluation 10/09/20    Authorization Type Traditional Medicare    Authorization Time Period 07/17/2020- 10/09/2020    PT Start Time 1430    PT Stop Time 1500    PT Time Calculation (min) 30 min    Equipment Utilized During Treatment Gait belt    Activity Tolerance Treatment limited secondary to medical complications (Comment)    Behavior During Therapy [facility] for tasks assessed/performed           Past Medical History:  Diagnosis Date  . Allergy   . Anxiety   . GERD (gastroesophageal reflux disease)   . Headache   . History of kidney stones   . Hypertension   . Myocardial infarction ([place])    1997  . Osteoporosis   . Pneumonia     Past Surgical History:  Procedure Laterality Date  . ABDOMINAL HYSTERECTOMY     patient has ovaries  . APPENDECTOMY    . ARTERY BIOPSY Right 12/30/2017   Procedure: BIOPSY TEMPORAL ARTERY;  Surgeon: Katha Cabal, MD;  Location: [facility] ORS;  Service: Vascular;  Laterality: Right;  . ARTERY BIOPSY Left 06/18/2018   Procedure: BIOPSY TEMPORAL ARTERY;  Surgeon: Katha Cabal, MD;  Location: [facility] ORS;  Service: Vascular;  Laterality: Left;  . BACK SURGERY    . CARDIAC CATHETERIZATION    . CATARACT EXTRACTION W/ INTRAOCULAR LENS  IMPLANT, BILATERAL Bilateral 2010  . CHOLECYSTECTOMY N/A 02/28/2016   Procedure: LAPAROSCOPIC CHOLECYSTECTOMY WITH INTRAOPERATIVE CHOLANGIOGRAM;  Surgeon: Dia Crawford III, MD;  Location: [facility] ORS;  Service: General;  Laterality: N/A;  . LUMBAR FUSION  11/09/2016   L3-L5  . SPINE SURGERY     Disc   . TONSILLECTOMY      Subjective Assessment - 09/11/20 1322    Subjective Patient reports not feeling well today- "just not up to par with some weakness, difficulty with walking, mild spacey feeling."    Pertinent History Patient reports history of chronic LBP with past lumbar fusion surgery on 11/09/2016 and most recent lumbar facet joint block procedure on 06/19/2020. She reports past medical history of spondylosis, Lung Cancer, HTN. She reports increased difficulty performing ADL's yet still independent but has paid services who assist with cleaning.    Limitations Standing;Lifting;Walking;House hold activities    Currently in Pain? Yes    Pain Score 5     Pain Location Back    Pain Orientation Posterior    Pain Descriptors / Indicators Aching    Pain Type Chronic pain    Pain Onset More than a month ago    Pain Frequency Constant    Multiple Pain Sites No         Vitals:   BP= 136/61 mmHg Left UE in sitting. HR =82 bpm O2 sat=98% Standing= 111/60 mmHg - "not like vertigo but felt spacey"    Seated interventions:  TA contraction with hip march 3# AW x 15 reps each TA contraction with knee ext 3# AW x15 reps each   Rechecked Vitals after seated therex and instruction to  drink 6 -8 oz water BP= 142/69 mmHg, HR=79 bpm. Sitting LUE BP= 124/66 mmHg, HR= 77 bpm- standing LUE- "feeling just weak with standing."   Deferred further treatment secondary to patient not feeling well today.   Clinical Impression: treatment limited to vital assessment and minimal LE seated activities. Unable to progress patient due to orthostatic BP. Patient instructed to continue to monitor for next couple of days until she returns and notify MD if symptoms persist or worsen. She verbalized understanding of plan.                             PT Education - 09/12/20 1331    Education Details Educated regarding blood pressure readings.    Person(s) Educated Patient    Methods  Explanation    Comprehension Verbalized understanding            PT Short Term Goals - 07/17/20 2123      PT SHORT TERM GOAL #1   Title Patient will be independent in home exercise program to improve pain relief/ strength/mobility for better functional independence with ADLs.    Baseline 07/17/2020- Patient doesn't have any formal HEP in place.    Time 6    Period Weeks    Status New    Target Date 08/28/20             PT Long Term Goals - 08/29/20 1043      PT LONG TERM GOAL #1   Title Patient will increase FOTO score to equal to or greater than 63 to demonstrate statistically significant improvement in mobility and quality of life.    Baseline 07/17/2020: FOTO score=60    Time 12    Period Weeks    Status On-going    Target Date 10/09/20      PT LONG TERM GOAL #2   Title Pt will decrease worst back pain as reported on NPRS by at least 2 points in order to demonstrate clinically significant reduction in back pain.    Baseline 07/17/2020- 5/10 LBP at worst; 08/29/20: reaches around a 7/10 almost daily in the past week.    Time 12    Period Weeks    Status On-going    Target Date 10/09/20      PT LONG TERM GOAL #3   Title Patient (> 82 years old) will complete five times sit to stand test in <13 seconds indicating an increased LE strength and improved balance.   Previously was 18sec goal   Baseline 07/17/2020- 26.02 sec; 08/29/20: 14.34sec    Time 12    Period Weeks    Status Revised    Target Date 10/09/20      PT LONG TERM GOAL #4   Title Patient will increase Berg Balance score by > 4 points to demonstrate decreased fall risk during functional activities.   Will update to either a SLS goal or a DGI/FGA goal next session.   Baseline 07/17/2020- BERG = 49/56; 08/29/20: 50/56 (will convert to new balance goal, as pt cannot improve any more (statistically) due to ceiling effect and medical restrictions)    Time 12    Period Weeks    Status Revised    Target Date  10/09/20      PT LONG TERM GOAL #5   Title Patient will increase 10 meter walk test to >1.105m/s as to improve gait speed for better community ambulation and to reduce fall risk.  Baseline 07/17/2020= 10MWT avg=11.0 sec (0.91 m/s) without an AD; 08/29/20: 1.72m/s self selected, 1.27m/s;    Time 12    Period Weeks    Status Achieved    Target Date 10/09/20                 Plan - 09/11/20 1330    Clinical Impression Statement Patient reported feeling better after heat to left shoulder. He was able to improve with steps forward/backward with VC today and no difficulty with seated LE strengthening. Pt will benefit from further skilled PT to improve BLE strength, balance and functional mobility    Personal Factors and Comorbidities Age;Comorbidity 3+    Comorbidities Osteoporosis, HTN, CHF    Examination-Activity Limitations Bend;Caring for Others;Carry;Lift;Reach Overhead;Sit;Squat;Stairs;Stand    Examination-Participation Restrictions Cleaning;Community Activity;Laundry;Shop;Yard Work    Stability/Clinical Decision Making Stable/Uncomplicated    Rehab Potential Good    PT Frequency 2x / week    PT Duration 2 weeks    PT Treatment/Interventions ADLs/Self Care Home Management;Cryotherapy;Moist Heat;DME Instruction;Stair training;Functional mobility training;Therapeutic activities;Therapeutic exercise;Balance training;Neuromuscular re-education;Patient/family education;Manual techniques    PT Next Visit Plan LE strengthening, Low back stretching, manual therapy for pain relief,  balance training.    PT Home Exercise Plan no changes    Consulted and Agree with Plan of Care Patient           Patient will benefit from skilled therapeutic intervention in order to improve the following deficits and impairments:  Abnormal gait,Decreased balance,Decreased coordination,Decreased activity tolerance,Cardiopulmonary status limiting activity,Decreased endurance,Decreased mobility,Decreased  strength,Difficulty walking,Pain  Visit Diagnosis: Difficulty in walking, not elsewhere classified  Unsteadiness on feet  Muscle weakness (generalized)  Other abnormalities of gait and mobility     Problem List Patient Active Problem List   Diagnosis Date Noted  . Fall (on) (from) other stairs and steps, initial encounter 08/06/2020  . Lumbar facet joint syndrome 06/18/2020  . Spondylosis without myelopathy or radiculopathy, lumbosacral region 06/18/2020  . DDD (degenerative disc disease), lumbosacral 06/18/2020  . COVID 06/04/2020  . Chronic pain syndrome 05/16/2020  . Pharmacologic therapy 05/16/2020  . Disorder of skeletal system 05/16/2020  . Problems influencing health status 05/16/2020  . 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 Chronic hip pain (2ry area of Pain) (Bilateral) (R>L) 05/16/2020  . Chronic lower extremity pain (Left) 05/16/2020  . Numbness and tingling of both feet (intermittent/recurrent) 05/16/2020  . Pain in rib (Bilateral) (R>L) (intermittent) 05/16/2020  . Chronic knee pain (Left) 05/16/2020  . Patellar pain (Left) (chronic, intermittent) 05/16/2020  . Hard of hearing 05/16/2020  . Carcinoma, lung, right ([place]) 06/25/2019  . S/P partial lobectomy of lung 02/25/2019  . Mass of upper lobe of right lung 01/06/2019  . Chronic nonintractable headache 06/14/2018  . Head ache 12/03/2017  . Sinus bradycardia 10/06/2017  . Chronic low back pain (1ry area of Pain) (Bilateral) (L>R) w/o sciatica 08/17/2017  . Panic attack as reaction to stress 08/17/2017  . Anxiety 06/15/2017  . Atypical chest pain 06/15/2017  . Hyperlipidemia 06/15/2017  . Cholecystitis with cholelithiasis 02/27/2016  . Increased frequency of urination 01/27/2015  . History of night sweats 05/01/2014  . Dupuytren's contracture 07/18/2013  . Inverted nipple 04/18/2013  . Depression 04/23/2010  . Fibromyalgia 04/23/2010  . Hypercholesterolemia 04/23/2010  . Essential hypertension  04/23/2010  . Hypothyroidism 04/23/2010  . Irritable colon 04/23/2010  . Osteoarthritis 04/23/2010    Lewis Moccasin, PT 09/12/2020, 1:33 PM  [place] MAIN [facility] SERVICES [address]  [place], [place], [address] Phone: [phone]   Fax:  [phone]  Name: ALIZAE BECHTEL MRN: [id number] Date of Birth: 04-05-1941

## 2020-09-13 ENCOUNTER — Ambulatory Visit: Payer: Medicare Other

## 2020-09-17 ENCOUNTER — Ambulatory Visit: Payer: Medicare Other

## 2020-09-17 ENCOUNTER — Other Ambulatory Visit: Payer: Self-pay

## 2020-09-17 DIAGNOSIS — R2689 Other abnormalities of gait and mobility: Secondary | ICD-10-CM

## 2020-09-17 DIAGNOSIS — R262 Difficulty in walking, not elsewhere classified: Secondary | ICD-10-CM

## 2020-09-17 DIAGNOSIS — M6281 Muscle weakness (generalized): Secondary | ICD-10-CM

## 2020-09-17 DIAGNOSIS — R2681 Unsteadiness on feet: Secondary | ICD-10-CM

## 2020-09-17 NOTE — Therapy (Signed)
[place] MAIN [facility] SERVICES [place], [place], [address] Phone: [phone]   Fax:  [phone]  Physical Therapy Treatment  Patient Details  Name: Kendra Benton MRN: [id number] Date of Birth: [birth year] Referring Provider (PT): Dr. Sharia Reeve   Encounter Date: 09/17/2020   PT End of Session - 09/17/20 1359    Visit Number 14    Number of Visits 25    Date for PT Re-Evaluation 10/09/20    Authorization Type Traditional Medicare    Authorization Time Period 07/17/2020- 10/09/2020    PT Start Time 1350    PT Stop Time 1429    PT Time Calculation (min) 39 min    Equipment Utilized During Treatment Gait belt    Activity Tolerance Treatment limited secondary to medical complications (Comment)    Behavior During Therapy [facility] for tasks assessed/performed           Past Medical History:  Diagnosis Date  . Allergy   . Anxiety   . GERD (gastroesophageal reflux disease)   . Headache   . History of kidney stones   . Hypertension   . Myocardial infarction ([place])    1997  . Osteoporosis   . Pneumonia     Past Surgical History:  Procedure Laterality Date  . ABDOMINAL HYSTERECTOMY     patient has ovaries  . APPENDECTOMY    . ARTERY BIOPSY Right 12/30/2017   Procedure: BIOPSY TEMPORAL ARTERY;  Surgeon: Katha Cabal, MD;  Location: [facility] ORS;  Service: Vascular;  Laterality: Right;  . ARTERY BIOPSY Left 06/18/2018   Procedure: BIOPSY TEMPORAL ARTERY;  Surgeon: Katha Cabal, MD;  Location: [facility] ORS;  Service: Vascular;  Laterality: Left;  . BACK SURGERY    . CARDIAC CATHETERIZATION    . CATARACT EXTRACTION W/ INTRAOCULAR LENS  IMPLANT, BILATERAL Bilateral 2010  . CHOLECYSTECTOMY N/A 02/28/2016   Procedure: LAPAROSCOPIC CHOLECYSTECTOMY WITH INTRAOPERATIVE CHOLANGIOGRAM;  Surgeon: Dia Crawford III, MD;  Location: [facility] ORS;  Service: General;  Laterality: N/A;  . LUMBAR FUSION  11/09/2016   L3-L5  . SPINE SURGERY     Disc   . TONSILLECTOMY      Vitals at rest upon arrival:  BP = 171/65 mmHg Left UE sitting HR = 71 bpm   LBP = 4/10 (right side lumbar)  Standing BP = 121/61 mmHg  Left UE * spacey feeling   Sitting BP manual = 172/82 mmHg  Standing BP manual = 128/68 mmHg *wobbly  PC to Dr. Sharia Reeve office to relay above mentioned values exhibiting symptomatic orthostatic hypotension. Spoke with Earnest Bailey, Nurse with Dr. Sharia Reeve who stated she would notify MD and call patient to discuss.   Moist heat to low back in supine while performing PROM/manual stretching:   Lower trunk rotation left to right B hamstring B Piriformis STM to right sided paraspinals.  Patient reported some improvement after stretching today- 3/10 right sided low back pain.   Clinical Impression: Treatment limited secondary to patient presenting with orthostatic hypotension today. Notified PCP of patient condition and MD office to follow up with patient. She did respond to heat and stretching with decreased reported pain. Pt will benefit from further skilled PT to improve BLE strength, balance and functional mobility    Subjective Assessment - 09/17/20 1301    Subjective Patient reports still not feeling great but states she is not as "spacey" Reports right sided low back pain- up to 4/10 today.    Pertinent History Patient reports  history of chronic LBP with past lumbar fusion surgery on 11/09/2016 and most recent lumbar facet joint block procedure on 06/19/2020. She reports past medical history of spondylosis, Lung Cancer, HTN. She reports increased difficulty performing ADL's yet still independent but has paid services who assist with cleaning.    Limitations Standing;Lifting;Walking;House hold activities    Currently in Pain? Yes    Pain Score 4     Pain Location Back    Pain Orientation Right;Posterior    Pain Descriptors / Indicators Aching    Pain Type Chronic pain    Pain Onset More than a month ago    Pain Frequency  Constant                                     PT Education - 09/18/20 1304    Education Details Education in orthostatic hypotension    Person(s) Educated Patient    Methods Explanation;Demonstration;Verbal cues    Comprehension Verbalized understanding;Returned demonstration;Verbal cues required            PT Short Term Goals - 07/17/20 2123      PT SHORT TERM GOAL #1   Title Patient will be independent in home exercise program to improve pain relief/ strength/mobility for better functional independence with ADLs.    Baseline 07/17/2020- Patient doesn't have any formal HEP in place.    Time 6    Period Weeks    Status New    Target Date 08/28/20             PT Long Term Goals - 08/29/20 1043      PT LONG TERM GOAL #1   Title Patient will increase FOTO score to equal to or greater than 63 to demonstrate statistically significant improvement in mobility and quality of life.    Baseline 07/17/2020: FOTO score=60    Time 12    Period Weeks    Status On-going    Target Date 10/09/20      PT LONG TERM GOAL #2   Title Pt will decrease worst back pain as reported on NPRS by at least 2 points in order to demonstrate clinically significant reduction in back pain.    Baseline 07/17/2020- 5/10 LBP at worst; 08/29/20: reaches around a 7/10 almost daily in the past week.    Time 12    Period Weeks    Status On-going    Target Date 10/09/20      PT LONG TERM GOAL #3   Title Patient (> 27 years old) will complete five times sit to stand test in <13 seconds indicating an increased LE strength and improved balance.   Previously was 18sec goal   Baseline 07/17/2020- 26.02 sec; 08/29/20: 14.34sec    Time 12    Period Weeks    Status Revised    Target Date 10/09/20      PT LONG TERM GOAL #4   Title Patient will increase Berg Balance score by > 4 points to demonstrate decreased fall risk during functional activities.   Will update to either a SLS goal or a  DGI/FGA goal next session.   Baseline 07/17/2020- BERG = 49/56; 08/29/20: 50/56 (will convert to new balance goal, as pt cannot improve any more (statistically) due to ceiling effect and medical restrictions)    Time 12    Period Weeks    Status Revised    Target Date 10/09/20  PT LONG TERM GOAL #5   Title Patient will increase 10 meter walk test to >1.79m/s as to improve gait speed for better community ambulation and to reduce fall risk.    Baseline 07/17/2020= 10MWT avg=11.0 sec (0.91 m/s) without an AD; 08/29/20: 1.51m/s self selected, 1.64m/s;    Time 12    Period Weeks    Status Achieved    Target Date 10/09/20                 Plan - 09/17/20 1400    Clinical Impression Statement Treatment limited secondary to patient presenting with orthostatic hypotension today. Notified PCP of patient condition and MD office to follow up with patient. She did respond to heat and stretching with decreased reported pain. Pt will benefit from further skilled PT to improve BLE strength, balance and functional mobility    Personal Factors and Comorbidities Age;Comorbidity 3+    Comorbidities Osteoporosis, HTN, CHF    Examination-Activity Limitations Bend;Caring for Others;Carry;Lift;Reach Overhead;Sit;Squat;Stairs;Stand    Examination-Participation Restrictions Cleaning;Community Activity;Laundry;Shop;Yard Work    Stability/Clinical Decision Making Stable/Uncomplicated    Rehab Potential Good    PT Frequency 2x / week    PT Duration 2 weeks    PT Treatment/Interventions ADLs/Self Care Home Management;Cryotherapy;Moist Heat;DME Instruction;Stair training;Functional mobility training;Therapeutic activities;Therapeutic exercise;Balance training;Neuromuscular re-education;Patient/family education;Manual techniques    PT Next Visit Plan LE strengthening, Low back stretching, manual therapy for pain relief,  balance training.    PT Home Exercise Plan no changes    Consulted and Agree with Plan of  Care Patient           Patient will benefit from skilled therapeutic intervention in order to improve the following deficits and impairments:  Abnormal gait,Decreased balance,Decreased coordination,Decreased activity tolerance,Cardiopulmonary status limiting activity,Decreased endurance,Decreased mobility,Decreased strength,Difficulty walking,Pain  Visit Diagnosis: Difficulty in walking, not elsewhere classified  Unsteadiness on feet  Muscle weakness (generalized)  Other abnormalities of gait and mobility     Problem List Patient Active Problem List   Diagnosis Date Noted  . Fall (on) (from) other stairs and steps, initial encounter 08/06/2020  . Lumbar facet joint syndrome 06/18/2020  . Spondylosis without myelopathy or radiculopathy, lumbosacral region 06/18/2020  . DDD (degenerative disc disease), lumbosacral 06/18/2020  . COVID 06/04/2020  . Chronic pain syndrome 05/16/2020  . Pharmacologic therapy 05/16/2020  . Disorder of skeletal system 05/16/2020  . Problems influencing health status 05/16/2020  . 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 Chronic hip pain (2ry area of Pain) (Bilateral) (R>L) 05/16/2020  . Chronic lower extremity pain (Left) 05/16/2020  . Numbness and tingling of both feet (intermittent/recurrent) 05/16/2020  . Pain in rib (Bilateral) (R>L) (intermittent) 05/16/2020  . Chronic knee pain (Left) 05/16/2020  . Patellar pain (Left) (chronic, intermittent) 05/16/2020  . Hard of hearing 05/16/2020  . 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 S/P partial lobectomy of lung 02/25/2019  . Mass of upper lobe of right lung 01/06/2019  . Chronic nonintractable headache 06/14/2018  . Head ache 12/03/2017  . Sinus bradycardia 10/06/2017  . Chronic low back pain (1ry area of Pain) (Bilateral) (L>R) w/o sciatica 08/17/2017  . Panic attack as reaction to stress 08/17/2017  . Anxiety 06/15/2017  . Atypical chest pain 06/15/2017  . Hyperlipidemia 06/15/2017   . Cholecystitis with cholelithiasis 02/27/2016  . Increased frequency of urination 01/27/2015  . History of night sweats 05/01/2014  . Dupuytren's contracture 07/18/2013  . Inverted nipple 04/18/2013  . Depression 04/23/2010  . Fibromyalgia 04/23/2010  . Hypercholesterolemia  04/23/2010  . Essential hypertension 04/23/2010  . Hypothyroidism 04/23/2010  . Irritable colon 04/23/2010  . Osteoarthritis 04/23/2010    Lewis Moccasin, PT 09/18/2020, 1:17 PM  [place] MAIN [facility] SERVICES [address] [place], [place], [address] Phone: [phone]   Fax:  [phone]  Name: HEAVENLEE MAIORANA MRN: [id number] Date of Birth: [birth year]

## 2020-09-19 ENCOUNTER — Other Ambulatory Visit: Payer: Self-pay

## 2020-09-19 ENCOUNTER — Ambulatory Visit: Payer: Medicare Other

## 2020-09-19 DIAGNOSIS — R262 Difficulty in walking, not elsewhere classified: Secondary | ICD-10-CM | POA: Diagnosis not present

## 2020-09-19 DIAGNOSIS — R269 Unspecified abnormalities of gait and mobility: Secondary | ICD-10-CM

## 2020-09-19 DIAGNOSIS — M6281 Muscle weakness (generalized): Secondary | ICD-10-CM

## 2020-09-19 DIAGNOSIS — R278 Other lack of coordination: Secondary | ICD-10-CM

## 2020-09-19 NOTE — Therapy (Signed)
[place] MAIN [facility] SERVICES [place], [place], [address] Phone: [phone]   Fax:  [phone]  Physical Therapy Treatment  Patient Details  Name: Kendra Benton MRN: [id number] Date of Birth: Mar 07, 1941 Referring Provider (PT): Dr. Sharia Reeve   Encounter Date: 09/19/2020   PT End of Session - 09/19/20 1958    Visit Number 15    Number of Visits 25    Date for PT Re-Evaluation 10/09/20    Authorization Type Traditional Medicare    Authorization Time Period 07/17/2020- 10/09/2020    PT Start Time 1432    PT Stop Time 1514    PT Time Calculation (min) 42 min    Equipment Utilized During Treatment Gait belt    Activity Tolerance Treatment limited secondary to medical complications (Comment)    Behavior During Therapy [facility] for tasks assessed/performed           Past Medical History:  Diagnosis Date  . Allergy   . Anxiety   . GERD (gastroesophageal reflux disease)   . Headache   . History of kidney stones   . Hypertension   . Myocardial infarction ([place])    1997  . Osteoporosis   . Pneumonia     Past Surgical History:  Procedure Laterality Date  . ABDOMINAL HYSTERECTOMY     patient has ovaries  . APPENDECTOMY    . ARTERY BIOPSY Right 12/30/2017   Procedure: BIOPSY TEMPORAL ARTERY;  Surgeon: Katha Cabal, MD;  Location: [facility] ORS;  Service: Vascular;  Laterality: Right;  . ARTERY BIOPSY Left 06/18/2018   Procedure: BIOPSY TEMPORAL ARTERY;  Surgeon: Katha Cabal, MD;  Location: [facility] ORS;  Service: Vascular;  Laterality: Left;  . BACK SURGERY    . CARDIAC CATHETERIZATION    . CATARACT EXTRACTION W/ INTRAOCULAR LENS  IMPLANT, BILATERAL Bilateral 2010  . CHOLECYSTECTOMY N/A 02/28/2016   Procedure: LAPAROSCOPIC CHOLECYSTECTOMY WITH INTRAOPERATIVE CHOLANGIOGRAM;  Surgeon: Dia Crawford III, MD;  Location: [facility] ORS;  Service: General;  Laterality: N/A;  . LUMBAR FUSION  11/09/2016   L3-L5  . SPINE SURGERY     Disc   . TONSILLECTOMY      There were no vitals filed for this visit.   Subjective Assessment - 09/19/20 1447    Subjective Patient reports that she was seen at Primary care for orthostatic Hypotension issues and instructed to remain hydrated, Wear compression stockings, and cut back on    Pertinent History Patient reports history of chronic LBP with past lumbar fusion surgery on 11/09/2016 and most recent lumbar facet joint block procedure on 06/19/2020. She reports past medical history of spondylosis, Lung Cancer, HTN. She reports increased difficulty performing ADL's yet still independent but has paid services who assist with cleaning.    Limitations Standing;Lifting;Walking;House hold activities    Pain Onset More than a month ago             Vitals at rest upon arrival:  BP = 149/65 mmHg Left UE supine HR = 76 bpm   LBP = 3/10 (right side lumbar)  Sitting BP = 127/60 mmHg  Left UE * spacey feeling   Standing BP manual = 120/50 mmHg  Moist heat to low back in supine while performing PROM/manual stretching: Patient only able to tolerate approx 5 min of heat but continued with below stretching.   Lower trunk rotation left to right - hold 30 sec x 4 each direction B hamstring- hold 30 sec x 4 each direction  B Piriformis- hold 30 sec x 4 each direction  STM to right sided paraspinals with patient lying in sidelye position.  Patient reported some improvement after stretching today- 2/10 right sided low back pain.   Clinical Impression: Treatment continues to be limited secondary to patient with orthostatic hypotension and MD aware. Patient did respond favorably to low back stretching and STM. She reported feeling better upon end of visit.  Pt will benefit from further skilled PT to improve BLE strength, balance and functional mobility                             PT Short Term Goals - 07/17/20 2123      PT SHORT TERM GOAL #1   Title Patient will be  independent in home exercise program to improve pain relief/ strength/mobility for better functional independence with ADLs.    Baseline 07/17/2020- Patient doesn't have any formal HEP in place.    Time 6    Period Weeks    Status New    Target Date 08/28/20             PT Long Term Goals - 08/29/20 1043      PT LONG TERM GOAL #1   Title Patient will increase FOTO score to equal to or greater than 63 to demonstrate statistically significant improvement in mobility and quality of life.    Baseline 07/17/2020: FOTO score=60    Time 12    Period Weeks    Status On-going    Target Date 10/09/20      PT LONG TERM GOAL #2   Title Pt will decrease worst back pain as reported on NPRS by at least 2 points in order to demonstrate clinically significant reduction in back pain.    Baseline 07/17/2020- 5/10 LBP at worst; 08/29/20: reaches around a 7/10 almost daily in the past week.    Time 12    Period Weeks    Status On-going    Target Date 10/09/20      PT LONG TERM GOAL #3   Title Patient (> 46 years old) will complete five times sit to stand test in <13 seconds indicating an increased LE strength and improved balance.   Previously was 18sec goal   Baseline 07/17/2020- 26.02 sec; 08/29/20: 14.34sec    Time 12    Period Weeks    Status Revised    Target Date 10/09/20      PT LONG TERM GOAL #4   Title Patient will increase Berg Balance score by > 4 points to demonstrate decreased fall risk during functional activities.   Will update to either a SLS goal or a DGI/FGA goal next session.   Baseline 07/17/2020- BERG = 49/56; 08/29/20: 50/56 (will convert to new balance goal, as pt cannot improve any more (statistically) due to ceiling effect and medical restrictions)    Time 12    Period Weeks    Status Revised    Target Date 10/09/20      PT LONG TERM GOAL #5   Title Patient will increase 10 meter walk test to >1.41m/s as to improve gait speed for better community ambulation and to  reduce fall risk.    Baseline 07/17/2020= 10MWT avg=11.0 sec (0.91 m/s) without an AD; 08/29/20: 1.67m/s self selected, 1.53m/s;    Time 12    Period Weeks    Status Achieved    Target Date 10/09/20  Plan - 09/20/20 1513    Clinical Impression Statement Treatment continues to be limited secondary to patient with orthostatic hypotension and MD aware. Patient did respond favorably to low back stretching and STM. She reported feeling better upon end of visit.  Pt will benefit from further skilled PT to improve BLE strength, balance and functional mobility    Personal Factors and Comorbidities Age;Comorbidity 3+    Comorbidities Osteoporosis, HTN, CHF    Examination-Activity Limitations Bend;Caring for Others;Carry;Lift;Reach Overhead;Sit;Squat;Stairs;Stand    Examination-Participation Restrictions Cleaning;Community Activity;Laundry;Shop;Yard Work    Stability/Clinical Decision Making Stable/Uncomplicated    Rehab Potential Good    PT Frequency 2x / week    PT Duration 2 weeks    PT Treatment/Interventions ADLs/Self Care Home Management;Cryotherapy;Moist Heat;DME Instruction;Stair training;Functional mobility training;Therapeutic activities;Therapeutic exercise;Balance training;Neuromuscular re-education;Patient/family education;Manual techniques    PT Next Visit Plan LE strengthening, Low back stretching, manual therapy for pain relief,  balance training.    PT Home Exercise Plan no changes    Consulted and Agree with Plan of Care Patient           Patient will benefit from skilled therapeutic intervention in order to improve the following deficits and impairments:  Abnormal gait,Decreased balance,Decreased coordination,Decreased activity tolerance,Cardiopulmonary status limiting activity,Decreased endurance,Decreased mobility,Decreased strength,Difficulty walking,Pain  Visit Diagnosis: Abnormality of gait and mobility  Difficulty in walking, not elsewhere  classified  Muscle weakness (generalized)  Other lack of coordination     Problem List Patient Active Problem List   Diagnosis Date Noted  . Fall (on) (from) other stairs and steps, initial encounter 08/06/2020  . Lumbar facet joint syndrome 06/18/2020  . Spondylosis without myelopathy or radiculopathy, lumbosacral region 06/18/2020  . DDD (degenerative disc disease), lumbosacral 06/18/2020  . COVID 06/04/2020  . Chronic pain syndrome 05/16/2020  . Pharmacologic therapy 05/16/2020  . Disorder of skeletal system 05/16/2020  . Problems influencing health status 05/16/2020  . 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 Chronic hip pain (2ry area of Pain) (Bilateral) (R>L) 05/16/2020  . Chronic lower extremity pain (Left) 05/16/2020  . Numbness and tingling of both feet (intermittent/recurrent) 05/16/2020  . Pain in rib (Bilateral) (R>L) (intermittent) 05/16/2020  . Chronic knee pain (Left) 05/16/2020  . Patellar pain (Left) (chronic, intermittent) 05/16/2020  . Hard of hearing 05/16/2020  . Carcinoma, lung, right ([place]) 06/25/2019  . S/P partial lobectomy of lung 02/25/2019  . Mass of upper lobe of right lung 01/06/2019  . Chronic nonintractable headache 06/14/2018  . Head ache 12/03/2017  . Sinus bradycardia 10/06/2017  . Chronic low back pain (1ry area of Pain) (Bilateral) (L>R) w/o sciatica 08/17/2017  . Panic attack as reaction to stress 08/17/2017  . Anxiety 06/15/2017  . Atypical chest pain 06/15/2017  . Hyperlipidemia 06/15/2017  . Cholecystitis with cholelithiasis 02/27/2016  . Increased frequency of urination 01/27/2015  . History of night sweats 05/01/2014  . Dupuytren's contracture 07/18/2013  . Inverted nipple 04/18/2013  . Depression 04/23/2010  . Fibromyalgia 04/23/2010  . Hypercholesterolemia 04/23/2010  . Essential hypertension 04/23/2010  . Hypothyroidism 04/23/2010  . Irritable colon 04/23/2010  . Osteoarthritis 04/23/2010    Lewis Moccasin,  PT 09/20/2020, 8:00 PM  [place] MAIN [facility] SERVICES [address] [place], [place], [address] Phone: [phone]   Fax:  [phone]  Name: Kendra Benton MRN: [id number] Date of Birth: 11-12-1940

## 2020-09-25 ENCOUNTER — Ambulatory Visit: Payer: Medicare Other

## 2020-09-25 ENCOUNTER — Other Ambulatory Visit: Payer: Self-pay

## 2020-09-25 DIAGNOSIS — R262 Difficulty in walking, not elsewhere classified: Secondary | ICD-10-CM | POA: Diagnosis not present

## 2020-09-25 DIAGNOSIS — R269 Unspecified abnormalities of gait and mobility: Secondary | ICD-10-CM

## 2020-09-25 DIAGNOSIS — M6281 Muscle weakness (generalized): Secondary | ICD-10-CM

## 2020-09-25 NOTE — Therapy (Signed)
[place] MAIN [facility] SERVICES [place], [place], [address] Phone: [phone]   Fax:  [phone]  Physical Therapy Treatment  Patient Details  Name: Kendra Benton MRN: [id number] Date of Birth: 27-Jan-1941 Referring Provider (PT): Dr. Sharia Reeve   Encounter Date: 09/25/2020   PT End of Session - 09/25/20 1048    Visit Number 16    Number of Visits 25    Date for PT Re-Evaluation 10/09/20    Authorization Type Traditional Medicare    Authorization Time Period 07/17/2020- 10/09/2020    PT Start Time 1023    PT Stop Time 1059    PT Time Calculation (min) 36 min    Equipment Utilized During Treatment Gait belt    Activity Tolerance Treatment limited secondary to medical complications (Comment)    Behavior During Therapy [facility] for tasks assessed/performed           Past Medical History:  Diagnosis Date  . Allergy   . Anxiety   . GERD (gastroesophageal reflux disease)   . Headache   . History of kidney stones   . Hypertension   . Myocardial infarction ([place])    1997  . Osteoporosis   . Pneumonia     Past Surgical History:  Procedure Laterality Date  . ABDOMINAL HYSTERECTOMY     patient has ovaries  . APPENDECTOMY    . ARTERY BIOPSY Right 12/30/2017   Procedure: BIOPSY TEMPORAL ARTERY;  Surgeon: Katha Cabal, MD;  Location: [facility] ORS;  Service: Vascular;  Laterality: Right;  . ARTERY BIOPSY Left 06/18/2018   Procedure: BIOPSY TEMPORAL ARTERY;  Surgeon: Katha Cabal, MD;  Location: [facility] ORS;  Service: Vascular;  Laterality: Left;  . BACK SURGERY    . CARDIAC CATHETERIZATION    . CATARACT EXTRACTION W/ INTRAOCULAR LENS  IMPLANT, BILATERAL Bilateral 2010  . CHOLECYSTECTOMY N/A 02/28/2016   Procedure: LAPAROSCOPIC CHOLECYSTECTOMY WITH INTRAOPERATIVE CHOLANGIOGRAM;  Surgeon: Dia Crawford III, MD;  Location: [facility] ORS;  Service: General;  Laterality: N/A;  . LUMBAR FUSION  11/09/2016   L3-L5  . SPINE SURGERY     Disc   . TONSILLECTOMY      There were no vitals filed for this visit.   Subjective Assessment - 09/25/20 1047    Subjective Patient reports she almost didn't make it to session. Has not been feeling well lately, feeling nauseas, swimming headed, weak. She went to the doctor since last session, didn't change her medication but increased her fluid intake.    Pertinent History Patient reports history of chronic LBP with past lumbar fusion surgery on 11/09/2016 and most recent lumbar facet joint block procedure on 06/19/2020. She reports past medical history of spondylosis, Lung Cancer, HTN. She reports increased difficulty performing ADL's yet still independent but has paid services who assist with cleaning.    Limitations Standing;Lifting;Walking;House hold activities    Currently in Pain? Yes    Pain Score 4     Pain Location Back    Pain Orientation Right    Pain Descriptors / Indicators Aching    Pain Type Chronic pain    Pain Onset More than a month ago    Pain Frequency Constant             Patient reports she almost didn't make it to session. Has not been feeling well lately, feeling nauseas, swimming headed, weak. She went to the doctor since last session, didn't change her medication but increased her fluid intake.  Seated BP 132/61  Standing BP: 102/50   Seated interventions:  RTB abduction 20x  RTB hamstring curl 10x each LE RTB marching 10x each LE RTB around ankles: alternating LAQ 10x each LE; very challenging for patient to perform.  TrA activation with pelvic tilt 10x 3 second hold TrA activation with UE raise 10x each hand   Pt educated throughout session about proper posture and technique with exercises. Improved exercise technique, movement at target joints, use of target muscles after min to mod verbal, visual, tactile cues.   Patient only tolerated seated interventions only due to drastic diastolic changes. Her session is additionally limited by late arrival and  patient not feeling well. Pt will benefit from further skilled PT to improve BLE strength, balance and functional mobility                       PT Education - 09/25/20 1048    Education Details exercise technique, body mechanics, orthostatic hypotension    Person(s) Educated Patient    Methods Explanation;Demonstration;Tactile cues;Verbal cues    Comprehension Verbalized understanding;Returned demonstration;Verbal cues required;Tactile cues required            PT Short Term Goals - 07/17/20 2123      PT SHORT TERM GOAL #1   Title Patient will be independent in home exercise program to improve pain relief/ strength/mobility for better functional independence with ADLs.    Baseline 07/17/2020- Patient doesn't have any formal HEP in place.    Time 6    Period Weeks    Status New    Target Date 08/28/20             PT Long Term Goals - 08/29/20 1043      PT LONG TERM GOAL #1   Title Patient will increase FOTO score to equal to or greater than 63 to demonstrate statistically significant improvement in mobility and quality of life.    Baseline 07/17/2020: FOTO score=60    Time 12    Period Weeks    Status On-going    Target Date 10/09/20      PT LONG TERM GOAL #2   Title Pt will decrease worst back pain as reported on NPRS by at least 2 points in order to demonstrate clinically significant reduction in back pain.    Baseline 07/17/2020- 5/10 LBP at worst; 08/29/20: reaches around a 7/10 almost daily in the past week.    Time 12    Period Weeks    Status On-going    Target Date 10/09/20      PT LONG TERM GOAL #3   Title Patient (> 18 years old) will complete five times sit to stand test in <13 seconds indicating an increased LE strength and improved balance.   Previously was 18sec goal   Baseline 07/17/2020- 26.02 sec; 08/29/20: 14.34sec    Time 12    Period Weeks    Status Revised    Target Date 10/09/20      PT LONG TERM GOAL #4   Title Patient will  increase Berg Balance score by > 4 points to demonstrate decreased fall risk during functional activities.   Will update to either a SLS goal or a DGI/FGA goal next session.   Baseline 07/17/2020- BERG = 49/56; 08/29/20: 50/56 (will convert to new balance goal, as pt cannot improve any more (statistically) due to ceiling effect and medical restrictions)    Time 12    Period Weeks  Status Revised    Target Date 10/09/20      PT LONG TERM GOAL #5   Title Patient will increase 10 meter walk test to >1.75m/s as to improve gait speed for better community ambulation and to reduce fall risk.    Baseline 07/17/2020= 10MWT avg=11.0 sec (0.91 m/s) without an AD; 08/29/20: 1.11m/s self selected, 1.39m/s;    Time 12    Period Weeks    Status Achieved    Target Date 10/09/20                 Plan - 09/25/20 1051    Clinical Impression Statement Patient only tolerated seated interventions only due to drastic diastolic changes. Her session is additionally limited by late arrival and patient not feeling well. Pt will benefit from further skilled PT to improve BLE strength, balance and functional mobility    Personal Factors and Comorbidities Age;Comorbidity 3+    Comorbidities Osteoporosis, HTN, CHF    Examination-Activity Limitations Bend;Caring for Others;Carry;Lift;Reach Overhead;Sit;Squat;Stairs;Stand    Examination-Participation Restrictions Cleaning;Community Activity;Laundry;Shop;Yard Work    Stability/Clinical Decision Making Stable/Uncomplicated    Rehab Potential Good    PT Frequency 2x / week    PT Duration 2 weeks    PT Treatment/Interventions ADLs/Self Care Home Management;Cryotherapy;Moist Heat;DME Instruction;Stair training;Functional mobility training;Therapeutic activities;Therapeutic exercise;Balance training;Neuromuscular re-education;Patient/family education;Manual techniques    PT Next Visit Plan LE strengthening, Low back stretching, manual therapy for pain relief,  balance  training.    PT Home Exercise Plan no changes    Consulted and Agree with Plan of Care Patient           Patient will benefit from skilled therapeutic intervention in order to improve the following deficits and impairments:  Abnormal gait,Decreased balance,Decreased coordination,Decreased activity tolerance,Cardiopulmonary status limiting activity,Decreased endurance,Decreased mobility,Decreased strength,Difficulty walking,Pain  Visit Diagnosis: Abnormality of gait and mobility  Difficulty in walking, not elsewhere classified  Muscle weakness (generalized)     Problem List Patient Active Problem List   Diagnosis Date Noted  . Fall (on) (from) other stairs and steps, initial encounter 08/06/2020  . Lumbar facet joint syndrome 06/18/2020  . Spondylosis without myelopathy or radiculopathy, lumbosacral region 06/18/2020  . DDD (degenerative disc disease), lumbosacral 06/18/2020  . COVID 06/04/2020  . Chronic pain syndrome 05/16/2020  . Pharmacologic therapy 05/16/2020  . Disorder of skeletal system 05/16/2020  . Problems influencing health status 05/16/2020  . 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 Chronic hip pain (2ry area of Pain) (Bilateral) (R>L) 05/16/2020  . Chronic lower extremity pain (Left) 05/16/2020  . Numbness and tingling of both feet (intermittent/recurrent) 05/16/2020  . Pain in rib (Bilateral) (R>L) (intermittent) 05/16/2020  . Chronic knee pain (Left) 05/16/2020  . Patellar pain (Left) (chronic, intermittent) 05/16/2020  . Hard of hearing 05/16/2020  . Carcinoma, lung, right ([place]) 06/25/2019  . S/P partial lobectomy of lung 02/25/2019  . Mass of upper lobe of right lung 01/06/2019  . Chronic nonintractable headache 06/14/2018  . Head ache 12/03/2017  . Sinus bradycardia 10/06/2017  . Chronic low back pain (1ry area of Pain) (Bilateral) (L>R) w/o sciatica 08/17/2017  . Panic attack as reaction to stress 08/17/2017  . Anxiety 06/15/2017  . Atypical  chest pain 06/15/2017  . Hyperlipidemia 06/15/2017  . Cholecystitis with cholelithiasis 02/27/2016  . Increased frequency of urination 01/27/2015  . History of night sweats 05/01/2014  . Dupuytren's contracture 07/18/2013  . Inverted nipple 04/18/2013  . Depression 04/23/2010  . Fibromyalgia 04/23/2010  . Hypercholesterolemia 04/23/2010  .  Essential hypertension 04/23/2010  . Hypothyroidism 04/23/2010  . Irritable colon 04/23/2010  . Osteoarthritis 04/23/2010   Janna Arch, PT, DPT   09/25/2020, 11:50 AM  [place] MAIN [facility] SERVICES [address] [place], [place], [address] Phone: [phone]   Fax:  [phone]  Name: VERDIE WILMS MRN: [id number] Date of Birth: [birth year]

## 2020-09-27 ENCOUNTER — Other Ambulatory Visit: Payer: Self-pay

## 2020-09-27 ENCOUNTER — Ambulatory Visit: Payer: Medicare Other

## 2020-09-27 DIAGNOSIS — M6281 Muscle weakness (generalized): Secondary | ICD-10-CM

## 2020-09-27 DIAGNOSIS — R262 Difficulty in walking, not elsewhere classified: Secondary | ICD-10-CM

## 2020-09-27 DIAGNOSIS — R269 Unspecified abnormalities of gait and mobility: Secondary | ICD-10-CM

## 2020-09-27 NOTE — Therapy (Signed)
[place] MAIN [facility] SERVICES [place], [place], [address] Phone: [phone]   Fax:  [phone]  Physical Therapy Treatment  Patient Details  Name: Kendra Benton MRN: [id number] Date of Birth: 07-Feb-1941 Referring Provider (PT): Dr. Sharia Reeve   Encounter Date: 09/27/2020   PT End of Session - 09/27/20 1547    Visit Number 17    Number of Visits 25    Date for PT Re-Evaluation 10/09/20    Authorization Type Traditional Medicare    Authorization Time Period 07/17/2020- 10/09/2020    PT Start Time 1435    PT Stop Time 1520    PT Time Calculation (min) 45 min    Equipment Utilized During Treatment Gait belt    Activity Tolerance Treatment limited secondary to medical complications (Comment)    Behavior During Therapy [facility] for tasks assessed/performed           Past Medical History:  Diagnosis Date  . Allergy   . Anxiety   . GERD (gastroesophageal reflux disease)   . Headache   . History of kidney stones   . Hypertension   . Myocardial infarction ([place])    1997  . Osteoporosis   . Pneumonia     Past Surgical History:  Procedure Laterality Date  . ABDOMINAL HYSTERECTOMY     patient has ovaries  . APPENDECTOMY    . ARTERY BIOPSY Right 12/30/2017   Procedure: BIOPSY TEMPORAL ARTERY;  Surgeon: Katha Cabal, MD;  Location: [facility] ORS;  Service: Vascular;  Laterality: Right;  . ARTERY BIOPSY Left 06/18/2018   Procedure: BIOPSY TEMPORAL ARTERY;  Surgeon: Katha Cabal, MD;  Location: [facility] ORS;  Service: Vascular;  Laterality: Left;  . BACK SURGERY    . CARDIAC CATHETERIZATION    . CATARACT EXTRACTION W/ INTRAOCULAR LENS  IMPLANT, BILATERAL Bilateral 2010  . CHOLECYSTECTOMY N/A 02/28/2016   Procedure: LAPAROSCOPIC CHOLECYSTECTOMY WITH INTRAOPERATIVE CHOLANGIOGRAM;  Surgeon: Dia Crawford III, MD;  Location: [facility] ORS;  Service: General;  Laterality: N/A;  . LUMBAR FUSION  11/09/2016   L3-L5  . SPINE SURGERY     Disc   . TONSILLECTOMY      There were no vitals filed for this visit.   Subjective Assessment - 09/27/20 1440    Subjective Patient reports she  Is just feeling tired all the time- no dizziness but just tired.  She does report cutting back on caffeine, wearing compression hose today, and drinking some Liquid IV drink    Pertinent History Patient reports history of chronic LBP with past lumbar fusion surgery on 11/09/2016 and most recent lumbar facet joint block procedure on 06/19/2020. She reports past medical history of spondylosis, Lung Cancer, HTN. She reports increased difficulty performing ADL's yet still independent but has paid services who assist with cleaning.    Limitations Standing;Lifting;Walking;House hold activities    Currently in Pain? Yes    Pain Score 5     Pain Location Back    Pain Orientation Posterior;Left    Pain Descriptors / Indicators Aching    Pain Type Chronic pain    Pain Onset More than a month ago    Pain Frequency Constant           Treatment:  Seated BP:  160/50 Standing BP: 142/46 mmHg ( no symptoms of lightheadness/dizziness)  Manual LE/Low back stretching: Hamstring - BLE x 20 sec hold x 3 each side Lower trunk rotation-BLE x 20 sec hold x 3 each side Piriformis-BLE  x 20 sec hold x 3 each side Knee to chest-BLE x 20 sec hold x 3 each side  TA contraction with Hip ER using RTB x 10  Bridging x 10  Sidelye clamshell with RTB and TA contraction x 10 reps each side. *No increased report of pain today.   Pt educated throughout session about proper posture and technique with exercises. Improved exercise technique, movement at target joints, use of target muscles after min to mod verbal, visual, tactile cues.   Clinical Impression: Treatment was again limited as patient continues to report just feeling very tired and near orthostatic values. She did report some relief in low back symptoms after stretching and able to particate in supine/sidelye Core  stabilization exercises but limited by fatigue. Pt will benefit from further skilled PT to improve BLE strength, balance and functional mobility                                         PT Education - 09/27/20 1546    Education Details core stabilization, rationale for not performing standing activities due to near othrostatic BP values.    Person(s) Educated Patient    Methods Explanation;Demonstration;Tactile cues;Verbal cues    Comprehension Verbalized understanding;Returned demonstration;Verbal cues required;Tactile cues required;Need further instruction            PT Short Term Goals - 07/17/20 2123      PT SHORT TERM GOAL #1   Title Patient will be independent in home exercise program to improve pain relief/ strength/mobility for better functional independence with ADLs.    Baseline 07/17/2020- Patient doesn't have any formal HEP in place.    Time 6    Period Weeks    Status New    Target Date 08/28/20             PT Long Term Goals - 08/29/20 1043      PT LONG TERM GOAL #1   Title Patient will increase FOTO score to equal to or greater than 63 to demonstrate statistically significant improvement in mobility and quality of life.    Baseline 07/17/2020: FOTO score=60    Time 12    Period Weeks    Status On-going    Target Date 10/09/20      PT LONG TERM GOAL #2   Title Pt will decrease worst back pain as reported on NPRS by at least 2 points in order to demonstrate clinically significant reduction in back pain.    Baseline 07/17/2020- 5/10 LBP at worst; 08/29/20: reaches around a 7/10 almost daily in the past week.    Time 12    Period Weeks    Status On-going    Target Date 10/09/20      PT LONG TERM GOAL #3   Title Patient (> 39 years old) will complete five times sit to stand test in <13 seconds indicating an increased LE strength and improved balance.   Previously was 18sec goal   Baseline 07/17/2020- 26.02 sec; 08/29/20:  14.34sec    Time 12    Period Weeks    Status Revised    Target Date 10/09/20      PT LONG TERM GOAL #4   Title Patient will increase Berg Balance score by > 4 points to demonstrate decreased fall risk during functional activities.   Will update to either a SLS goal or a DGI/FGA goal next session.  Baseline 07/17/2020- BERG = 49/56; 08/29/20: 50/56 (will convert to new balance goal, as pt cannot improve any more (statistically) due to ceiling effect and medical restrictions)    Time 12    Period Weeks    Status Revised    Target Date 10/09/20      PT LONG TERM GOAL #5   Title Patient will increase 10 meter walk test to >1.33m/s as to improve gait speed for better community ambulation and to reduce fall risk.    Baseline 07/17/2020= 10MWT avg=11.0 sec (0.91 m/s) without an AD; 08/29/20: 1.82m/s self selected, 1.68m/s;    Time 12    Period Weeks    Status Achieved    Target Date 10/09/20                 Plan - 09/27/20 1542    Clinical Impression Statement Treatment was again limited as patient continues to report just feeling very tired and near orthostatic values. She did report some relief in low back symptoms after stretching and able to particate in supine/sidelye Core stabilization exercises but limited by fatigue. Pt will benefit from further skilled PT to improve BLE strength, balance and functional mobility    Personal Factors and Comorbidities Age;Comorbidity 3+    Comorbidities Osteoporosis, HTN, CHF    Examination-Activity Limitations Bend;Caring for Others;Carry;Lift;Reach Overhead;Sit;Squat;Stairs;Stand    Examination-Participation Restrictions Cleaning;Community Activity;Laundry;Shop;Yard Work    Stability/Clinical Decision Making Stable/Uncomplicated    Rehab Potential Good    PT Frequency 2x / week    PT Duration 2 weeks    PT Treatment/Interventions ADLs/Self Care Home Management;Cryotherapy;Moist Heat;DME Instruction;Stair training;Functional mobility  training;Therapeutic activities;Therapeutic exercise;Balance training;Neuromuscular re-education;Patient/family education;Manual techniques    PT Next Visit Plan LE strengthening, Low back stretching, manual therapy for pain relief,  balance training.    PT Home Exercise Plan no changes    Consulted and Agree with Plan of Care Patient           Patient will benefit from skilled therapeutic intervention in order to improve the following deficits and impairments:  Abnormal gait,Decreased balance,Decreased coordination,Decreased activity tolerance,Cardiopulmonary status limiting activity,Decreased endurance,Decreased mobility,Decreased strength,Difficulty walking,Pain  Visit Diagnosis: Abnormality of gait and mobility  Difficulty in walking, not elsewhere classified  Muscle weakness (generalized)     Problem List Patient Active Problem List   Diagnosis Date Noted  . Fall (on) (from) other stairs and steps, initial encounter 08/06/2020  . Lumbar facet joint syndrome 06/18/2020  . Spondylosis without myelopathy or radiculopathy, lumbosacral region 06/18/2020  . DDD (degenerative disc disease), lumbosacral 06/18/2020  . COVID 06/04/2020  . Chronic pain syndrome 05/16/2020  . Pharmacologic therapy 05/16/2020  . Disorder of skeletal system 05/16/2020  . Problems influencing health status 05/16/2020  . 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 Chronic hip pain (2ry area of Pain) (Bilateral) (R>L) 05/16/2020  . Chronic lower extremity pain (Left) 05/16/2020  . Numbness and tingling of both feet (intermittent/recurrent) 05/16/2020  . Pain in rib (Bilateral) (R>L) (intermittent) 05/16/2020  . Chronic knee pain (Left) 05/16/2020  . Patellar pain (Left) (chronic, intermittent) 05/16/2020  . Hard of hearing 05/16/2020  . Carcinoma, lung, right ([place]) 06/25/2019  . S/P partial lobectomy of lung 02/25/2019  . Mass of upper lobe of right lung 01/06/2019  . Chronic nonintractable headache  06/14/2018  . Head ache 12/03/2017  . Sinus bradycardia 10/06/2017  . Chronic low back pain (1ry area of Pain) (Bilateral) (L>R) w/o sciatica 08/17/2017  . Panic attack as reaction to stress 08/17/2017  .  Anxiety 06/15/2017  . Atypical chest pain 06/15/2017  . Hyperlipidemia 06/15/2017  . Cholecystitis with cholelithiasis 02/27/2016  . Increased frequency of urination 01/27/2015  . History of night sweats 05/01/2014  . Dupuytren's contracture 07/18/2013  . Inverted nipple 04/18/2013  . Depression 04/23/2010  . Fibromyalgia 04/23/2010  . Hypercholesterolemia 04/23/2010  . Essential hypertension 04/23/2010  . Hypothyroidism 04/23/2010  . Irritable colon 04/23/2010  . Osteoarthritis 04/23/2010    Lewis Moccasin, PT 09/27/2020, 3:54 PM  [place] MAIN [facility] SERVICES [address] [place], [place], [address] Phone: [phone]   Fax:  [phone]  Name: AVEAH CASTELL MRN: [id number] Date of Birth: 1940/09/03

## 2020-10-02 ENCOUNTER — Ambulatory Visit: Payer: Medicare Other

## 2020-10-02 ENCOUNTER — Other Ambulatory Visit: Payer: Self-pay

## 2020-10-02 DIAGNOSIS — R262 Difficulty in walking, not elsewhere classified: Secondary | ICD-10-CM

## 2020-10-02 DIAGNOSIS — R278 Other lack of coordination: Secondary | ICD-10-CM

## 2020-10-02 DIAGNOSIS — R269 Unspecified abnormalities of gait and mobility: Secondary | ICD-10-CM

## 2020-10-02 DIAGNOSIS — M6281 Muscle weakness (generalized): Secondary | ICD-10-CM

## 2020-10-02 NOTE — Therapy (Signed)
[place] MAIN [facility] SERVICES [place], [place], [address] Phone: [phone]   Fax:  [phone]  Physical Therapy Treatment  Patient Details  Name: Kendra Benton MRN: [id number] Date of Birth: August 04, 1940 Referring Provider (PT): Dr. Sharia Reeve   Encounter Date: 10/02/2020   PT End of Session - 10/02/20 0913    Visit Number 18    Number of Visits 25    Date for PT Re-Evaluation 10/09/20    Authorization Type Traditional Medicare    Authorization Time Period 07/17/2020- 10/09/2020    PT Start Time 0259    PT Stop Time 0329    PT Time Calculation (min) 30 min    Equipment Utilized During Treatment Gait belt    Activity Tolerance Treatment limited secondary to medical complications (Comment)    Behavior During Therapy [facility] for tasks assessed/performed           Past Medical History:  Diagnosis Date  . Allergy   . Anxiety   . GERD (gastroesophageal reflux disease)   . Headache   . History of kidney stones   . Hypertension   . Myocardial infarction ([place])    1997  . Osteoporosis   . Pneumonia     Past Surgical History:  Procedure Laterality Date  . ABDOMINAL HYSTERECTOMY     patient has ovaries  . APPENDECTOMY    . ARTERY BIOPSY Right 12/30/2017   Procedure: BIOPSY TEMPORAL ARTERY;  Surgeon: Katha Cabal, MD;  Location: [facility] ORS;  Service: Vascular;  Laterality: Right;  . ARTERY BIOPSY Left 06/18/2018   Procedure: BIOPSY TEMPORAL ARTERY;  Surgeon: Katha Cabal, MD;  Location: [facility] ORS;  Service: Vascular;  Laterality: Left;  . BACK SURGERY    . CARDIAC CATHETERIZATION    . CATARACT EXTRACTION W/ INTRAOCULAR LENS  IMPLANT, BILATERAL Bilateral 2010  . CHOLECYSTECTOMY N/A 02/28/2016   Procedure: LAPAROSCOPIC CHOLECYSTECTOMY WITH INTRAOPERATIVE CHOLANGIOGRAM;  Surgeon: Dia Crawford III, MD;  Location: [facility] ORS;  Service: General;  Laterality: N/A;  . LUMBAR FUSION  11/09/2016   L3-L5  . SPINE SURGERY     Disc   . TONSILLECTOMY      There were no vitals filed for this visit.   Subjective Assessment - 10/02/20 1655    Subjective Patient reports she is feeling about the same today- Reports still "woozy when up some and still feeling really tired."    Pertinent History Patient reports history of chronic LBP with past lumbar fusion surgery on 11/09/2016 and most recent lumbar facet joint block procedure on 06/19/2020. She reports past medical history of spondylosis, Lung Cancer, HTN. She reports increased difficulty performing ADL's yet still independent but has paid services who assist with cleaning.    Limitations Standing;Lifting;Walking;House hold activities    Currently in Pain? Yes    Pain Score 5     Pain Location Back    Pain Orientation Posterior    Pain Descriptors / Indicators Aching    Pain Type Chronic pain    Pain Onset More than a month ago    Pain Frequency Constant           Treatment:  Supine BP at rest= 184/61 mmHg Left UE  Seated BP:  157/57 mmHg - patient reported mild wooziness Standing BP: 123/55  mmHg  Mild wooziness- just feeling weak with standing and "off"  Manual LE/Low back stretching: Hamstring - BLE x 20 sec hold x 3 each side Lower trunk rotation-BLE x 20  sec hold x 3 each side Piriformis-BLE x 20 sec hold x 3 each side Knee to chest-BLE x 20 sec hold x 3 each side  Contacted Dr. Jacklynn Barnacle- Received prompt that the office was closed. Will attempt tomorrow am to notify of patient condition.    Clinical Impression: Treatment was again limited as patient continues to report just feeling very tired and "Woozy symptoms and limited to only some Low back stretching. Discussed holding PT as patient reports she has MD appointment on 10/17/2020 and does not wish to see another practioner. Encouraged her to contact MD office or go to Urgent care if symptoms persist or ED if symptoms become emergent. Patient in agreement with holding PT until seen by PCP.  Pt  will benefit from further skilled PT to improve BLE strength, balance and functional mobility                                           PT Education - 10/02/20 1656    Education Details Reviewed orthostatic condition    Person(s) Educated Patient    Methods Explanation            PT Short Term Goals - 07/17/20 2123      PT SHORT TERM GOAL #1   Title Patient will be independent in home exercise program to improve pain relief/ strength/mobility for better functional independence with ADLs.    Baseline 07/17/2020- Patient doesn't have any formal HEP in place.    Time 6    Period Weeks    Status New    Target Date 08/28/20             PT Long Term Goals - 08/29/20 1043      PT LONG TERM GOAL #1   Title Patient will increase FOTO score to equal to or greater than 63 to demonstrate statistically significant improvement in mobility and quality of life.    Baseline 07/17/2020: FOTO score=60    Time 12    Period Weeks    Status On-going    Target Date 10/09/20      PT LONG TERM GOAL #2   Title Pt will decrease worst back pain as reported on NPRS by at least 2 points in order to demonstrate clinically significant reduction in back pain.    Baseline 07/17/2020- 5/10 LBP at worst; 08/29/20: reaches around a 7/10 almost daily in the past week.    Time 12    Period Weeks    Status On-going    Target Date 10/09/20      PT LONG TERM GOAL #3   Title Patient (> 90 years old) will complete five times sit to stand test in <13 seconds indicating an increased LE strength and improved balance.   Previously was 18sec goal   Baseline 07/17/2020- 26.02 sec; 08/29/20: 14.34sec    Time 12    Period Weeks    Status Revised    Target Date 10/09/20      PT LONG TERM GOAL #4   Title Patient will increase Berg Balance score by > 4 points to demonstrate decreased fall risk during functional activities.   Will update to either a SLS goal or a DGI/FGA goal next  session.   Baseline 07/17/2020- BERG = 49/56; 08/29/20: 50/56 (will convert to new balance goal, as pt cannot improve any more (statistically) due to ceiling effect  and medical restrictions)    Time 12    Period Weeks    Status Revised    Target Date 10/09/20      PT LONG TERM GOAL #5   Title Patient will increase 10 meter walk test to >1.68m/s as to improve gait speed for better community ambulation and to reduce fall risk.    Baseline 07/17/2020= 10MWT avg=11.0 sec (0.91 m/s) without an AD; 08/29/20: 1.88m/s self selected, 1.43m/s;    Time 12    Period Weeks    Status Achieved    Target Date 10/09/20                 Plan - 10/02/20 1651    Clinical Impression Statement Treatment was again limited as patient continues to report just feeling very tired and "Woozy symptoms and limited to only some Low back stretching. Discussed holding PT as patient reports she has MD appointment on 10/17/2020 and does not wish to see another practioner. Encouraged her to contact MD office or go to Urgent care if symptoms persist or ED if symptoms become emergent. Patient in agreement with holding PT until seen by PCP.  Pt will benefit from further skilled PT to improve BLE strength, balance and functional mobility    Personal Factors and Comorbidities Age;Comorbidity 3+    Comorbidities Osteoporosis, HTN, CHF    Examination-Activity Limitations Bend;Caring for Others;Carry;Lift;Reach Overhead;Sit;Squat;Stairs;Stand    Examination-Participation Restrictions Cleaning;Community Activity;Laundry;Shop;Yard Work    Stability/Clinical Decision Making Stable/Uncomplicated    Rehab Potential Good    PT Frequency 2x / week    PT Duration 2 weeks    PT Treatment/Interventions ADLs/Self Care Home Management;Cryotherapy;Moist Heat;DME Instruction;Stair training;Functional mobility training;Therapeutic activities;Therapeutic exercise;Balance training;Neuromuscular re-education;Patient/family education;Manual  techniques    PT Next Visit Plan Hold PT for now until Patient seen by MD- will hold until after 5/11 per conversation with patient.    PT Home Exercise Plan no changes    Consulted and Agree with Plan of Care Patient           Patient will benefit from skilled therapeutic intervention in order to improve the following deficits and impairments:  Abnormal gait,Decreased balance,Decreased coordination,Decreased activity tolerance,Cardiopulmonary status limiting activity,Decreased endurance,Decreased mobility,Decreased strength,Difficulty walking,Pain  Visit Diagnosis: Abnormality of gait and mobility  Difficulty in walking, not elsewhere classified  Muscle weakness (generalized)  Other lack of coordination     Problem List Patient Active Problem List   Diagnosis Date Noted  . Fall (on) (from) other stairs and steps, initial encounter 08/06/2020  . Lumbar facet joint syndrome 06/18/2020  . Spondylosis without myelopathy or radiculopathy, lumbosacral region 06/18/2020  . DDD (degenerative disc disease), lumbosacral 06/18/2020  . COVID 06/04/2020  . Chronic pain syndrome 05/16/2020  . Pharmacologic therapy 05/16/2020  . Disorder of skeletal system 05/16/2020  . Problems influencing health status 05/16/2020  . 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 Chronic hip pain (2ry area of Pain) (Bilateral) (R>L) 05/16/2020  . Chronic lower extremity pain (Left) 05/16/2020  . Numbness and tingling of both feet (intermittent/recurrent) 05/16/2020  . Pain in rib (Bilateral) (R>L) (intermittent) 05/16/2020  . Chronic knee pain (Left) 05/16/2020  . Patellar pain (Left) (chronic, intermittent) 05/16/2020  . Hard of hearing 05/16/2020  . Carcinoma, lung, right ([place]) 06/25/2019  . S/P partial lobectomy of lung 02/25/2019  . Mass of upper lobe of right lung 01/06/2019  . Chronic nonintractable headache 06/14/2018  . Head ache 12/03/2017  . Sinus bradycardia 10/06/2017  . Chronic low back  pain (1ry area of Pain) (Bilateral) (L>R) w/o sciatica 08/17/2017  . Panic attack as reaction to stress 08/17/2017  . Anxiety 06/15/2017  . Atypical chest pain 06/15/2017  . Hyperlipidemia 06/15/2017  . Cholecystitis with cholelithiasis 02/27/2016  . Increased frequency of urination 01/27/2015  . History of night sweats 05/01/2014  . Dupuytren's contracture 07/18/2013  . Inverted nipple 04/18/2013  . Depression 04/23/2010  . Fibromyalgia 04/23/2010  . Hypercholesterolemia 04/23/2010  . Essential hypertension 04/23/2010  . Hypothyroidism 04/23/2010  . Irritable colon 04/23/2010  . Osteoarthritis 04/23/2010    Lewis Moccasin, PT 10/03/2020, 9:17 AM  [place] MAIN [facility] SERVICES [address] [place], [place], [address] Phone: [phone]   Fax:  [phone]  Name: EMELEE RODOCKER MRN: [id number] Date of Birth: 1940/10/04

## 2020-10-04 ENCOUNTER — Ambulatory Visit: Payer: Medicare Other

## 2020-10-08 ENCOUNTER — Ambulatory Visit: Payer: Medicare Other

## 2020-10-10 ENCOUNTER — Ambulatory Visit: Payer: Medicare Other

## 2020-10-15 ENCOUNTER — Ambulatory Visit: Payer: Medicare Other

## 2020-10-17 ENCOUNTER — Ambulatory Visit: Payer: Medicare Other

## 2020-10-24 ENCOUNTER — Ambulatory Visit: Payer: Medicare Other | Attending: Internal Medicine

## 2020-10-24 ENCOUNTER — Other Ambulatory Visit: Payer: Self-pay

## 2020-10-24 DIAGNOSIS — R278 Other lack of coordination: Secondary | ICD-10-CM

## 2020-10-24 DIAGNOSIS — M6281 Muscle weakness (generalized): Secondary | ICD-10-CM | POA: Diagnosis present

## 2020-10-24 DIAGNOSIS — R262 Difficulty in walking, not elsewhere classified: Secondary | ICD-10-CM | POA: Diagnosis present

## 2020-10-24 DIAGNOSIS — R2689 Other abnormalities of gait and mobility: Secondary | ICD-10-CM | POA: Diagnosis present

## 2020-10-24 DIAGNOSIS — R269 Unspecified abnormalities of gait and mobility: Secondary | ICD-10-CM | POA: Diagnosis present

## 2020-10-24 NOTE — Therapy (Signed)
[place] MAIN [facility] SERVICES [address] [place], [place], [address] Phone: [phone]   Fax:  [phone]  Physical Therapy Treatment/Recertification for dates 10/24/2020- 01/16/2021  Patient Details  Name: Kendra Benton MRN: [id number] Date of Birth: 1941/02/16 Referring Provider (PT): Dr. Sharia Reeve   Encounter Date: 10/24/2020   PT End of Session - 10/24/20 1449    Visit Number 19    Number of Visits 25    Date for PT Re-Evaluation 01/16/21    Authorization Type Traditional Medicare    Authorization Time Period 07/17/2020- 10/09/2020    PT Start Time 1434    PT Stop Time 1514    PT Time Calculation (min) 40 min    Equipment Utilized During Treatment Gait belt    Activity Tolerance Patient tolerated treatment well    Behavior During Therapy [facility] for tasks assessed/performed           Past Medical History:  Diagnosis Date  . Allergy   . Anxiety   . GERD (gastroesophageal reflux disease)   . Headache   . History of kidney stones   . Hypertension   . Myocardial infarction ([place])    1997  . Osteoporosis   . Pneumonia     Past Surgical History:  Procedure Laterality Date  . ABDOMINAL HYSTERECTOMY     patient has ovaries  . APPENDECTOMY    . ARTERY BIOPSY Right 12/30/2017   Procedure: BIOPSY TEMPORAL ARTERY;  Surgeon: Katha Cabal, MD;  Location: [facility] ORS;  Service: Vascular;  Laterality: Right;  . ARTERY BIOPSY Left 06/18/2018   Procedure: BIOPSY TEMPORAL ARTERY;  Surgeon: Katha Cabal, MD;  Location: [facility] ORS;  Service: Vascular;  Laterality: Left;  . BACK SURGERY    . CARDIAC CATHETERIZATION    . CATARACT EXTRACTION W/ INTRAOCULAR LENS  IMPLANT, BILATERAL Bilateral 2010  . CHOLECYSTECTOMY N/A 02/28/2016   Procedure: LAPAROSCOPIC CHOLECYSTECTOMY WITH INTRAOPERATIVE CHOLANGIOGRAM;  Surgeon: Dia Crawford III, MD;  Location: [facility] ORS;  Service: General;  Laterality: N/A;  . LUMBAR FUSION  11/09/2016   L3-L5  . SPINE  SURGERY     Disc  . TONSILLECTOMY      There were no vitals filed for this visit.   Subjective Assessment - 10/24/20 1446    Subjective I went to see my MD and he said I was okay to continue with PT. He stopped my furosemide    Pertinent History Patient reports history of chronic LBP with past lumbar fusion surgery on 11/09/2016 and most recent lumbar facet joint block procedure on 06/19/2020. She reports past medical history of spondylosis, Lung Cancer, HTN. She reports increased difficulty performing ADL's yet still independent but has paid services who assist with cleaning.    Limitations Standing;Lifting;Walking;House hold activities    Pain Onset More than a month ago           Interventions:    Vitals: 167/56 mmHg left UE at rest in sitting. HR=66 bpm Standing: 151/58 mmHg left UE at rest in sitting; HR=68 bpm. *Patient was asymptomatic  Assessed all Goals today- see goal section  Objective: Patient Presents with tenderness along posterior iliac crest border and left sided paraspinals.      5xSTS= 22 FGA= 20/30 10 mwt= 12.34 sec  Clinical Impression: Patient returns to clinic following hold for past couple of weeks due to orthostatic BP reading. Patient has followed up with PCP and instructed to stay hydrated and stop furosemide and now to set up  appt with neurology for further evaluation. Patient was reassessed today and presents with slower gait speed, decreased self perceived ability, and continued LBP and imbalance. Set up new balance goal and will continue to progress toward established goals. Her progress has been limited by medical condition and hold and Patient's condition has the potential to improve in response to therapy. Maximum improvement is yet to be obtained. The anticipated improvement is attainable and reasonable in a generally predictable time.                   PT Education - 10/24/20 2255    Education Details balance education    Person(s)  Educated Patient    Methods Explanation;Verbal cues    Comprehension Verbalized understanding;Need further instruction            PT Short Term Goals - 10/24/20 1456      PT SHORT TERM GOAL #1   Title Patient will be independent in home exercise program to improve pain relief/ strength/mobility for better functional independence with ADLs.    Baseline 07/17/2020- Patient doesn't have any formal HEP in place. 10/24/2020- Patient reports compliance with current HEP for Low back stretching, core and LE strengthening.    Time 6    Period Weeks    Status Achieved    Target Date 08/28/20             PT Long Term Goals - 10/24/20 1501      PT LONG TERM GOAL #1   Title Patient will increase FOTO score to equal to or greater than 63 to demonstrate statistically significant improvement in mobility and quality of life.    Baseline 07/17/2020: FOTO score=60; 10/24/2020= 40    Time 12    Period Weeks    Status On-going    Target Date 01/16/21      PT LONG TERM GOAL #2   Title Pt will decrease worst back pain as reported on NPRS by at least 2 points in order to demonstrate clinically significant reduction in back pain.    Baseline 07/17/2020- 5/10 LBP at worst; 08/29/20: reaches around a 7/10 almost daily in the past week. 10/24/2020= Patient rates at a 3/10 current and up to 6/10 at worst.    Time 12    Period Weeks    Status On-going    Target Date 01/16/21      PT LONG TERM GOAL #3   Title Patient (> 45 years old) will complete five times sit to stand test in <13 seconds indicating an increased LE strength and improved balance.   Previously was 18sec goal   Baseline 07/17/2020- 26.02 sec; 08/29/20: 14.34sec  10/24/2020 = 22.45 sec    Time 12    Period Weeks    Status On-going      PT LONG TERM GOAL #4   Title Patient will increase Berg Balance score by > 4 points to demonstrate decreased fall risk during functional activities.   Will update to either a SLS goal or a DGI/FGA goal next  session.   Baseline 07/17/2020- BERG = 49/56; 08/29/20: 50/56 (will convert to new balance goal, as pt cannot improve any more (statistically) due to ceiling effect and medical restrictions)    Time 12    Period Weeks    Status Revised      PT LONG TERM GOAL #5   Title Patient will increase 10 meter walk test to >1.73m/s as to improve gait speed for better community ambulation and  to reduce fall risk.    Baseline 07/17/2020= 10MWT avg=11.0 sec (0.91 m/s) without an AD; 08/29/20: 1.67m/s self selected, 1.40m/s;    Time 12    Period Weeks    Status Achieved      Additional Long Term Goals   Additional Long Term Goals Yes      PT LONG TERM GOAL #6   Title Patient will increase Functional Gait Assessment score to >23/30 as to reduce fall risk and improve dynamic gait safety with community ambulation.    Baseline 10/24/2020= FGA score of 20/30    Time 12    Period Weeks    Status New    Target Date 01/16/21                 Plan - 10/24/20 1452    Clinical Impression Statement Patient returns to clinic following hold for past couple of weeks due to orthostatic BP reading. Patient has followed up with PCP and instructed to stay hydrated and stop furosemide and now to set up appt with neurology for further evaluation. Patient was reassessed today and presents with slower gait speed, decreased self perceived ability, and continued LBP and imbalance. Set up new balance goal and will continue to progress toward established goals. Her progress has been limited by medical condition and hold and Patient's condition has the potential to improve in response to therapy. Maximum improvement is yet to be obtained. The anticipated improvement is attainable and reasonable in a generally predictable time.    Personal Factors and Comorbidities Age;Comorbidity 3+    Comorbidities Osteoporosis, HTN, CHF    Examination-Activity Limitations Bend;Caring for Others;Carry;Lift;Reach  Overhead;Sit;Squat;Stairs;Stand    Examination-Participation Restrictions Cleaning;Community Activity;Laundry;Shop;Yard Work    Stability/Clinical Decision Making Stable/Uncomplicated    Rehab Potential Good    PT Frequency 2x / week    PT Duration 2 weeks    PT Treatment/Interventions ADLs/Self Care Home Management;Cryotherapy;Moist Heat;DME Instruction;Stair training;Functional mobility training;Therapeutic activities;Therapeutic exercise;Balance training;Neuromuscular re-education;Patient/family education;Manual techniques    PT Next Visit Plan Manual therapy/PROM for low back symptoms, balance/core/LE strengthening and training for improved mobility and function    PT Home Exercise Plan no changes    Consulted and Agree with Plan of Care Patient           Patient will benefit from skilled therapeutic intervention in order to improve the following deficits and impairments:  Abnormal gait,Decreased balance,Decreased coordination,Decreased activity tolerance,Cardiopulmonary status limiting activity,Decreased endurance,Decreased mobility,Decreased strength,Difficulty walking,Pain  Visit Diagnosis: Abnormality of gait and mobility  Difficulty in walking, not elsewhere classified  Muscle weakness (generalized)  Other lack of coordination     Problem List Patient Active Problem List   Diagnosis Date Noted  . Fall (on) (from) other stairs and steps, initial encounter 08/06/2020  . Lumbar facet joint syndrome 06/18/2020  . Spondylosis without myelopathy or radiculopathy, lumbosacral region 06/18/2020  . DDD (degenerative disc disease), lumbosacral 06/18/2020  . COVID 06/04/2020  . Chronic pain syndrome 05/16/2020  . Pharmacologic therapy 05/16/2020  . Disorder of skeletal system 05/16/2020  . Problems influencing health status 05/16/2020  . 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 Chronic hip pain (2ry area of Pain) (Bilateral) (R>L) 05/16/2020  . Chronic lower extremity pain  (Left) 05/16/2020  . Numbness and tingling of both feet (intermittent/recurrent) 05/16/2020  . Pain in rib (Bilateral) (R>L) (intermittent) 05/16/2020  . Chronic knee pain (Left) 05/16/2020  . Patellar pain (Left) (chronic, intermittent) 05/16/2020  . Hard of hearing 05/16/2020  . Carcinoma, lung, right (  [place]) 06/25/2019  . S/P partial lobectomy of lung 02/25/2019  . Mass of upper lobe of right lung 01/06/2019  . Chronic nonintractable headache 06/14/2018  . Head ache 12/03/2017  . Sinus bradycardia 10/06/2017  . Chronic low back pain (1ry area of Pain) (Bilateral) (L>R) w/o sciatica 08/17/2017  . Panic attack as reaction to stress 08/17/2017  . Anxiety 06/15/2017  . Atypical chest pain 06/15/2017  . Hyperlipidemia 06/15/2017  . Cholecystitis with cholelithiasis 02/27/2016  . Increased frequency of urination 01/27/2015  . History of night sweats 05/01/2014  . Dupuytren's contracture 07/18/2013  . Inverted nipple 04/18/2013  . Depression 04/23/2010  . Fibromyalgia 04/23/2010  . Hypercholesterolemia 04/23/2010  . Essential hypertension 04/23/2010  . Hypothyroidism 04/23/2010  . Irritable colon 04/23/2010  . Osteoarthritis 04/23/2010    Lewis Moccasin, PT 10/25/2020, 10:58 AM  [place] MAIN [facility] SERVICES [address] [place], [place], [address] Phone: [phone]   Fax:  [phone]  Name: Kendra Benton MRN: [id number] Date of Birth: 27-Oct-1940

## 2020-10-29 ENCOUNTER — Other Ambulatory Visit: Payer: Self-pay

## 2020-10-29 ENCOUNTER — Ambulatory Visit: Payer: Medicare Other

## 2020-10-29 DIAGNOSIS — R269 Unspecified abnormalities of gait and mobility: Secondary | ICD-10-CM

## 2020-10-29 DIAGNOSIS — R2689 Other abnormalities of gait and mobility: Secondary | ICD-10-CM

## 2020-10-29 DIAGNOSIS — R262 Difficulty in walking, not elsewhere classified: Secondary | ICD-10-CM

## 2020-10-29 DIAGNOSIS — M6281 Muscle weakness (generalized): Secondary | ICD-10-CM

## 2020-10-29 NOTE — Therapy (Signed)
[place] MAIN [facility] SERVICES [place], [place], [address] Phone: [phone]   Fax:  [phone]  Physical Therapy Treatment/Physical Therapy Progress Note   Dates of reporting period  08/29/2020   to   10/29/2020  Patient Details  Name: Kendra Benton MRN: [id number] Date of Birth: 10-04-1940 Referring Provider (PT): Dr. Sharia Reeve   Encounter Date: 10/29/2020   PT End of Session - 10/29/20 1428    Visit Number 20    Number of Visits 25    Date for PT Re-Evaluation 01/16/21    Authorization Type Traditional Medicare    Authorization Time Period 07/17/2020- 10/09/2020    PT Start Time 1430    PT Stop Time 1513    PT Time Calculation (min) 43 min    Equipment Utilized During Treatment Gait belt    Activity Tolerance Patient tolerated treatment well    Behavior During Therapy [facility] for tasks assessed/performed           Past Medical History:  Diagnosis Date  . Allergy   . Anxiety   . GERD (gastroesophageal reflux disease)   . Headache   . History of kidney stones   . Hypertension   . Myocardial infarction ([place])    1997  . Osteoporosis   . Pneumonia     Past Surgical History:  Procedure Laterality Date  . ABDOMINAL HYSTERECTOMY     patient has ovaries  . APPENDECTOMY    . ARTERY BIOPSY Right 12/30/2017   Procedure: BIOPSY TEMPORAL ARTERY;  Surgeon: Katha Cabal, MD;  Location: [facility] ORS;  Service: Vascular;  Laterality: Right;  . ARTERY BIOPSY Left 06/18/2018   Procedure: BIOPSY TEMPORAL ARTERY;  Surgeon: Katha Cabal, MD;  Location: [facility] ORS;  Service: Vascular;  Laterality: Left;  . BACK SURGERY    . CARDIAC CATHETERIZATION    . CATARACT EXTRACTION W/ INTRAOCULAR LENS  IMPLANT, BILATERAL Bilateral 2010  . CHOLECYSTECTOMY N/A 02/28/2016   Procedure: LAPAROSCOPIC CHOLECYSTECTOMY WITH INTRAOPERATIVE CHOLANGIOGRAM;  Surgeon: Dia Crawford III, MD;  Location: [facility] ORS;  Service: General;  Laterality: N/A;  .  LUMBAR FUSION  11/09/2016   L3-L5  . SPINE SURGERY     Disc  . TONSILLECTOMY      There were no vitals filed for this visit.  Interventions:   Assessed Leg length discrepancy- Both legs measure at approx 81 cm with symmetrical iliac crest however ASIS on left more superior than right and approx 2cm shorter left leg (functional with long sitting).   Performed manual stretching including long axis distraction to left LE with prolonged hold x 30 sec x 10 reps.   Contract relax performed with both hip flex and hip ext with 5 sec hold x 10 reps each direction.  Piriformis stretch- hold 30 sec x 4 reps Instruction in sidelye stretch by adding 1/2 foam roll under right flank to stretch left flank - hold 3 min.  Bilateral knee to chest and hamstring passive stretching x 20 sec hold x 3 each leg *Patient reported decreased overall pain 2/10 and remeasured landmarks with med malleoli more symmetrical with decreased leg length discrepancy.  Education provided throughout session via VC/TC and demonstration to facilitate movement at target joints and correct muscle activation for all testing and exercises performed.           Clinical Impression: Patient's goals not directly reassessed due to patient goals were just assessed last visit for recert visit.  She responded well overall  to manual techniques and stretching today- reporting less pain after treatment and alignment improved with ankle more symmetrical after interventions. Added some stretching to patient's home program and will review next visit.  Patient's condition has the potential to improve in response to therapy. Maximum improvement is yet to be obtained. The anticipated improvement is attainable and reasonable in a generally predictable time.                  PT Education - 10/30/20 0845    Education Details Low back stretching education    Person(s) Educated Patient    Methods Explanation;Demonstration;Verbal cues     Comprehension Verbalized understanding;Verbal cues required;Returned demonstration;Need further instruction            PT Short Term Goals - 10/24/20 1456      PT SHORT TERM GOAL #1   Title Patient will be independent in home exercise program to improve pain relief/ strength/mobility for better functional independence with ADLs.    Baseline 07/17/2020- Patient doesn't have any formal HEP in place. 10/24/2020- Patient reports compliance with current HEP for Low back stretching, core and LE strengthening.    Time 6    Period Weeks    Status Achieved    Target Date 08/28/20             PT Long Term Goals - 10/24/20 1501      PT LONG TERM GOAL #1   Title Patient will increase FOTO score to equal to or greater than 63 to demonstrate statistically significant improvement in mobility and quality of life.    Baseline 07/17/2020: FOTO score=60; 10/24/2020= 40    Time 12    Period Weeks    Status On-going    Target Date 01/16/21      PT LONG TERM GOAL #2   Title Pt will decrease worst back pain as reported on NPRS by at least 2 points in order to demonstrate clinically significant reduction in back pain.    Baseline 07/17/2020- 5/10 LBP at worst; 08/29/20: reaches around a 7/10 almost daily in the past week. 10/24/2020= Patient rates at a 3/10 current and up to 6/10 at worst.    Time 12    Period Weeks    Status On-going    Target Date 01/16/21      PT LONG TERM GOAL #3   Title Patient (> 47 years old) will complete five times sit to stand test in <13 seconds indicating an increased LE strength and improved balance.   Previously was 18sec goal   Baseline 07/17/2020- 26.02 sec; 08/29/20: 14.34sec  10/24/2020 = 22.45 sec    Time 12    Period Weeks    Status On-going      PT LONG TERM GOAL #4   Title Patient will increase Berg Balance score by > 4 points to demonstrate decreased fall risk during functional activities.   Will update to either a SLS goal or a DGI/FGA goal next session.    Baseline 07/17/2020- BERG = 49/56; 08/29/20: 50/56 (will convert to new balance goal, as pt cannot improve any more (statistically) due to ceiling effect and medical restrictions)    Time 12    Period Weeks    Status Revised      PT LONG TERM GOAL #5   Title Patient will increase 10 meter walk test to >1.7m/s as to improve gait speed for better community ambulation and to reduce fall risk.    Baseline 07/17/2020= 10MWT avg=11.0 sec (  0.91 m/s) without an AD; 08/29/20: 1.34m/s self selected, 1.57m/s;    Time 12    Period Weeks    Status Achieved      Additional Long Term Goals   Additional Long Term Goals Yes      PT LONG TERM GOAL #6   Title Patient will increase Functional Gait Assessment score to >23/30 as to reduce fall risk and improve dynamic gait safety with community ambulation.    Baseline 10/24/2020= FGA score of 20/30    Time 12    Period Weeks    Status New    Target Date 01/16/21                 Plan - 10/29/20 1428    Clinical Impression Statement Patient's goals not directly reassessed due to patient goals were just assessed last visit for recert visit.  She responded well overall to manual techniques and stretching today- reporting less pain after treatment and alignment improved with ankle more symmetrical after interventions. Added some stretching to patient's home program and will review next visit.  Patient's condition has the potential to improve in response to therapy. Maximum improvement is yet to be obtained. The anticipated improvement is attainable and reasonable in a generally predictable time.    Personal Factors and Comorbidities Age;Comorbidity 3+    Comorbidities Osteoporosis, HTN, CHF    Examination-Activity Limitations Bend;Caring for Others;Carry;Lift;Reach Overhead;Sit;Squat;Stairs;Stand    Examination-Participation Restrictions Cleaning;Community Activity;Laundry;Shop;Yard Work    Stability/Clinical Decision Making Stable/Uncomplicated    Rehab  Potential Good    PT Frequency 2x / week    PT Duration 2 weeks    PT Treatment/Interventions ADLs/Self Care Home Management;Cryotherapy;Moist Heat;DME Instruction;Stair training;Functional mobility training;Therapeutic activities;Therapeutic exercise;Balance training;Neuromuscular re-education;Patient/family education;Manual techniques    PT Next Visit Plan Manual therapy/PROM for low back symptoms, balance/core/LE strengthening and training for improved mobility and function    PT Home Exercise Plan no changes    Consulted and Agree with Plan of Care Patient           Patient will benefit from skilled therapeutic intervention in order to improve the following deficits and impairments:  Abnormal gait,Decreased balance,Decreased coordination,Decreased activity tolerance,Cardiopulmonary status limiting activity,Decreased endurance,Decreased mobility,Decreased strength,Difficulty walking,Pain  Visit Diagnosis: Other abnormalities of gait and mobility  Abnormality of gait and mobility  Difficulty in walking, not elsewhere classified  Muscle weakness (generalized)     Problem List Patient Active Problem List   Diagnosis Date Noted  . Fall (on) (from) other stairs and steps, initial encounter 08/06/2020  . Lumbar facet joint syndrome 06/18/2020  . Spondylosis without myelopathy or radiculopathy, lumbosacral region 06/18/2020  . DDD (degenerative disc disease), lumbosacral 06/18/2020  . COVID 06/04/2020  . Chronic pain syndrome 05/16/2020  . Pharmacologic therapy 05/16/2020  . Disorder of skeletal system 05/16/2020  . Problems influencing health status 05/16/2020  . 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 Chronic hip pain (2ry area of Pain) (Bilateral) (R>L) 05/16/2020  . Chronic lower extremity pain (Left) 05/16/2020  . Numbness and tingling of both feet (intermittent/recurrent) 05/16/2020  . Pain in rib (Bilateral) (R>L) (intermittent) 05/16/2020  . Chronic knee pain (Left)  05/16/2020  . Patellar pain (Left) (chronic, intermittent) 05/16/2020  . Hard of hearing 05/16/2020  . Carcinoma, lung, right ([place]) 06/25/2019  . S/P partial lobectomy of lung 02/25/2019  . Mass of upper lobe of right lung 01/06/2019  . Chronic nonintractable headache 06/14/2018  . Head ache 12/03/2017  . Sinus bradycardia 10/06/2017  . Chronic  low back pain (1ry area of Pain) (Bilateral) (L>R) w/o sciatica 08/17/2017  . Panic attack as reaction to stress 08/17/2017  . Anxiety 06/15/2017  . Atypical chest pain 06/15/2017  . Hyperlipidemia 06/15/2017  . Cholecystitis with cholelithiasis 02/27/2016  . Increased frequency of urination 01/27/2015  . History of night sweats 05/01/2014  . Dupuytren's contracture 07/18/2013  . Inverted nipple 04/18/2013  . Depression 04/23/2010  . Fibromyalgia 04/23/2010  . Hypercholesterolemia 04/23/2010  . Essential hypertension 04/23/2010  . Hypothyroidism 04/23/2010  . Irritable colon 04/23/2010  . Osteoarthritis 04/23/2010    Lewis Moccasin, PT 10/30/2020, 8:49 AM  [place] MAIN [facility] SERVICES [address] [place], [place], [address] Phone: [phone]   Fax:  [phone]  Name: Kendra Benton MRN: [id number] Date of Birth: Oct 09, 1940

## 2020-10-31 ENCOUNTER — Ambulatory Visit: Payer: Medicare Other

## 2020-11-07 ENCOUNTER — Other Ambulatory Visit: Payer: Self-pay

## 2020-11-07 ENCOUNTER — Ambulatory Visit: Payer: Medicare Other | Attending: Internal Medicine

## 2020-11-07 DIAGNOSIS — M6281 Muscle weakness (generalized): Secondary | ICD-10-CM | POA: Diagnosis present

## 2020-11-07 DIAGNOSIS — R278 Other lack of coordination: Secondary | ICD-10-CM | POA: Diagnosis present

## 2020-11-07 DIAGNOSIS — R262 Difficulty in walking, not elsewhere classified: Secondary | ICD-10-CM

## 2020-11-07 DIAGNOSIS — R269 Unspecified abnormalities of gait and mobility: Secondary | ICD-10-CM

## 2020-11-07 NOTE — Therapy (Signed)
[place] MAIN [facility] SERVICES [address] [place], [place], [address] Phone: [phone]   Fax:  [phone]  Physical Therapy Treatment  Patient Details  Name: Kendra Benton MRN: [id number] Date of Birth: Sep 27, 1940 Referring Provider (PT): Dr. Sharia Reeve   Encounter Date: 11/07/2020   PT End of Session - 11/07/20 1443    Visit Number 21    Number of Visits 25    Date for PT Re-Evaluation 01/16/21    Authorization Type Traditional Medicare    Authorization Time Period 07/17/2020- 10/09/2020    PT Start Time 1433    PT Stop Time 1514    PT Time Calculation (min) 41 min    Equipment Utilized During Treatment Gait belt    Activity Tolerance Patient tolerated treatment well    Behavior During Therapy [facility] for tasks assessed/performed           Past Medical History:  Diagnosis Date  . Allergy   . Anxiety   . GERD (gastroesophageal reflux disease)   . Headache   . History of kidney stones   . Hypertension   . Myocardial infarction ([place])    1997  . Osteoporosis   . Pneumonia     Past Surgical History:  Procedure Laterality Date  . ABDOMINAL HYSTERECTOMY     patient has ovaries  . APPENDECTOMY    . ARTERY BIOPSY Right 12/30/2017   Procedure: BIOPSY TEMPORAL ARTERY;  Surgeon: Katha Cabal, MD;  Location: [facility] ORS;  Service: Vascular;  Laterality: Right;  . ARTERY BIOPSY Left 06/18/2018   Procedure: BIOPSY TEMPORAL ARTERY;  Surgeon: Katha Cabal, MD;  Location: [facility] ORS;  Service: Vascular;  Laterality: Left;  . BACK SURGERY    . CARDIAC CATHETERIZATION    . CATARACT EXTRACTION W/ INTRAOCULAR LENS  IMPLANT, BILATERAL Bilateral 2010  . CHOLECYSTECTOMY N/A 02/28/2016   Procedure: LAPAROSCOPIC CHOLECYSTECTOMY WITH INTRAOPERATIVE CHOLANGIOGRAM;  Surgeon: Dia Crawford III, MD;  Location: [facility] ORS;  Service: General;  Laterality: N/A;  . LUMBAR FUSION  11/09/2016   L3-L5  . SPINE SURGERY     Disc  . TONSILLECTOMY      There  were no vitals filed for this visit.   Subjective Assessment - 11/07/20 1440    Subjective Patient reports falling last Friday outside of home walking from car parked on street.    Pertinent History Patient reports history of chronic LBP with past lumbar fusion surgery on 11/09/2016 and most recent lumbar facet joint block procedure on 06/19/2020. She reports past medical history of spondylosis, Lung Cancer, HTN. She reports increased difficulty performing ADL's yet still independent but has paid services who assist with cleaning.    Limitations Standing;Lifting;Walking;House hold activities    Currently in Pain? Yes    Pain Score 5     Pain Location Back    Pain Orientation Posterior;Lower    Pain Descriptors / Indicators Aching    Pain Type Chronic pain    Pain Onset More than a month ago    Pain Frequency Constant           Vitals:  BP= 116/91 mmHg left UE sitting  HR- 68 bpm; O2 sat=98% BP in standing after 3 min rest= 125/82 mmHg Left UE (asymptomatic)  Interventions:   PROM to low back secondary to patient reporting increased soreness overall.  Knee to chest  Hip flex/ext/IR/ER Lower trunk rotation Hamstrings Piriformis Sciatic neural stretch ALL x 30 sec x 3 times each LE.  *  Attempted STM to left sided paraspinals however patient exhibited increase tenderness with palpation- deferred.   Reviewed self stretching- HS, LTR, Piriformis, KTC and patient verbalized understanding.   Clinical Impression: Treatment limited secondary to patient sore from recent fall. Discussed fall prevention and recommended for patient to begin to use delivery grocery service and not to carry any bags in from the car. Recommended use of cane or walker for safe mobility. Unable to progress to more core stabilization or LE strengthening today. Pt will benefit from further skilled PT to improve BLE strength, balance and functional mobility                          PT Education -  11/08/20 1759    Education Details low back stretching/fall preventions- importance of use of cane/walker    Person(s) Educated Patient    Methods Explanation;Demonstration;Tactile cues;Verbal cues    Comprehension Verbalized understanding;Returned demonstration;Verbal cues required;Tactile cues required;Need further instruction            PT Short Term Goals - 10/24/20 1456      PT SHORT TERM GOAL #1   Title Patient will be independent in home exercise program to improve pain relief/ strength/mobility for better functional independence with ADLs.    Baseline 07/17/2020- Patient doesn't have any formal HEP in place. 10/24/2020- Patient reports compliance with current HEP for Low back stretching, core and LE strengthening.    Time 6    Period Weeks    Status Achieved    Target Date 08/28/20             PT Long Term Goals - 10/24/20 1501      PT LONG TERM GOAL #1   Title Patient will increase FOTO score to equal to or greater than 63 to demonstrate statistically significant improvement in mobility and quality of life.    Baseline 07/17/2020: FOTO score=60; 10/24/2020= 40    Time 12    Period Weeks    Status On-going    Target Date 01/16/21      PT LONG TERM GOAL #2   Title Pt will decrease worst back pain as reported on NPRS by at least 2 points in order to demonstrate clinically significant reduction in back pain.    Baseline 07/17/2020- 5/10 LBP at worst; 08/29/20: reaches around a 7/10 almost daily in the past week. 10/24/2020= Patient rates at a 3/10 current and up to 6/10 at worst.    Time 12    Period Weeks    Status On-going    Target Date 01/16/21      PT LONG TERM GOAL #3   Title Patient (> 65 years old) will complete five times sit to stand test in <13 seconds indicating an increased LE strength and improved balance.   Previously was 18sec goal   Baseline 07/17/2020- 26.02 sec; 08/29/20: 14.34sec  10/24/2020 = 22.45 sec    Time 12    Period Weeks    Status On-going       PT LONG TERM GOAL #4   Title Patient will increase Berg Balance score by > 4 points to demonstrate decreased fall risk during functional activities.   Will update to either a SLS goal or a DGI/FGA goal next session.   Baseline 07/17/2020- BERG = 49/56; 08/29/20: 50/56 (will convert to new balance goal, as pt cannot improve any more (statistically) due to ceiling effect and medical restrictions)    Time 12  Period Weeks    Status Revised      PT LONG TERM GOAL #5   Title Patient will increase 10 meter walk test to >1.15m/s as to improve gait speed for better community ambulation and to reduce fall risk.    Baseline 07/17/2020= 10MWT avg=11.0 sec (0.91 m/s) without an AD; 08/29/20: 1.4m/s self selected, 1.48m/s;    Time 12    Period Weeks    Status Achieved      Additional Long Term Goals   Additional Long Term Goals Yes      PT LONG TERM GOAL #6   Title Patient will increase Functional Gait Assessment score to >23/30 as to reduce fall risk and improve dynamic gait safety with community ambulation.    Baseline 10/24/2020= FGA score of 20/30    Time 12    Period Weeks    Status New    Target Date 01/16/21                 Plan - 11/07/20 1443    Clinical Impression Statement Treatment limited secondary to patient sore from recent fall. Discussed fall prevention and recommended for patient to begin to use delivery grocery service and not to carry any bags in from the car. Recommended use of cane or walker for safe mobility. Unable to progress to more core stabilization or LE strengthening today. Pt will benefit from further skilled PT to improve BLE strength, balance and functional mobility    Personal Factors and Comorbidities Age;Comorbidity 3+    Comorbidities Osteoporosis, HTN, CHF    Examination-Activity Limitations Bend;Caring for Others;Carry;Lift;Reach Overhead;Sit;Squat;Stairs;Stand    Examination-Participation Restrictions Cleaning;Community Activity;Laundry;Shop;Yard  Work    Stability/Clinical Decision Making Stable/Uncomplicated    Rehab Potential Good    PT Frequency 2x / week    PT Duration 2 weeks    PT Treatment/Interventions ADLs/Self Care Home Management;Cryotherapy;Moist Heat;DME Instruction;Stair training;Functional mobility training;Therapeutic activities;Therapeutic exercise;Balance training;Neuromuscular re-education;Patient/family education;Manual techniques    PT Next Visit Plan Manual therapy/PROM for low back symptoms, balance/core/LE strengthening and training for improved mobility and function    PT Home Exercise Plan no changes    Consulted and Agree with Plan of Care Patient           Patient will benefit from skilled therapeutic intervention in order to improve the following deficits and impairments:  Abnormal gait,Decreased balance,Decreased coordination,Decreased activity tolerance,Cardiopulmonary status limiting activity,Decreased endurance,Decreased mobility,Decreased strength,Difficulty walking,Pain  Visit Diagnosis: Abnormality of gait and mobility  Difficulty in walking, not elsewhere classified  Muscle weakness (generalized)     Problem List Patient Active Problem List   Diagnosis Date Noted  . Fall (on) (from) other stairs and steps, initial encounter 08/06/2020  . Lumbar facet joint syndrome 06/18/2020  . Spondylosis without myelopathy or radiculopathy, lumbosacral region 06/18/2020  . DDD (degenerative disc disease), lumbosacral 06/18/2020  . COVID 06/04/2020  . Chronic pain syndrome 05/16/2020  . Pharmacologic therapy 05/16/2020  . Disorder of skeletal system 05/16/2020  . Problems influencing health status 05/16/2020  . 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 Chronic hip pain (2ry area of Pain) (Bilateral) (R>L) 05/16/2020  . Chronic lower extremity pain (Left) 05/16/2020  . Numbness and tingling of both feet (intermittent/recurrent) 05/16/2020  . Pain in rib (Bilateral) (R>L) (intermittent)  05/16/2020  . Chronic knee pain (Left) 05/16/2020  . Patellar pain (Left) (chronic, intermittent) 05/16/2020  . Hard of hearing 05/16/2020  . Carcinoma, lung, right ([place]) 06/25/2019  . S/P partial lobectomy of lung 02/25/2019  .  Mass of upper lobe of right lung 01/06/2019  . Chronic nonintractable headache 06/14/2018  . Head ache 12/03/2017  . Sinus bradycardia 10/06/2017  . Chronic low back pain (1ry area of Pain) (Bilateral) (L>R) w/o sciatica 08/17/2017  . Panic attack as reaction to stress 08/17/2017  . Anxiety 06/15/2017  . Atypical chest pain 06/15/2017  . Hyperlipidemia 06/15/2017  . Cholecystitis with cholelithiasis 02/27/2016  . Increased frequency of urination 01/27/2015  . History of night sweats 05/01/2014  . Dupuytren's contracture 07/18/2013  . Inverted nipple 04/18/2013  . Depression 04/23/2010  . Fibromyalgia 04/23/2010  . Hypercholesterolemia 04/23/2010  . Essential hypertension 04/23/2010  . Hypothyroidism 04/23/2010  . Irritable colon 04/23/2010  . Osteoarthritis 04/23/2010    Lewis Moccasin, PT 11/08/2020, 8:49 PM  [place] MAIN [facility] SERVICES [address] [place], [place], [address] Phone: [phone]   Fax:  [phone]  Name: Kendra Benton MRN: [id number] Date of Birth: 09-07-1940

## 2020-11-14 ENCOUNTER — Encounter: Payer: Medicare Other | Admitting: Pain Medicine

## 2020-11-15 ENCOUNTER — Ambulatory Visit: Payer: Medicare Other

## 2020-11-15 ENCOUNTER — Other Ambulatory Visit: Payer: Self-pay

## 2020-11-15 DIAGNOSIS — R262 Difficulty in walking, not elsewhere classified: Secondary | ICD-10-CM

## 2020-11-15 DIAGNOSIS — R269 Unspecified abnormalities of gait and mobility: Secondary | ICD-10-CM | POA: Diagnosis not present

## 2020-11-15 DIAGNOSIS — M6281 Muscle weakness (generalized): Secondary | ICD-10-CM

## 2020-11-15 DIAGNOSIS — R278 Other lack of coordination: Secondary | ICD-10-CM

## 2020-11-15 NOTE — Therapy (Signed)
[place] MAIN [facility] SERVICES [address] [place], [place], [address] Phone: [phone]   Fax:  [phone]  Physical Therapy Treatment  Patient Details  Name: Kendra Benton MRN: [id number] Date of Birth: 10/18/1940 Referring Provider (PT): Dr. Sharia Reeve   Encounter Date: 11/15/2020   PT End of Session - 11/15/20 1628     Visit Number 22    Number of Visits 25    Date for PT Re-Evaluation 01/16/21    Authorization Type Traditional Medicare    Authorization Time Period 07/17/2020- 10/09/2020    PT Start Time 1618    PT Stop Time 1704    PT Time Calculation (min) 46 min    Equipment Utilized During Treatment Gait belt    Activity Tolerance Patient tolerated treatment well    Behavior During Therapy WFL for tasks assessed/performed             Past Medical History:  Diagnosis Date   Allergy    Anxiety    GERD (gastroesophageal reflux disease)    Headache    History of kidney stones    Hypertension    Myocardial infarction ([place])    1997   Osteoporosis    Pneumonia     Past Surgical History:  Procedure Laterality Date   ABDOMINAL HYSTERECTOMY     patient has ovaries   APPENDECTOMY     ARTERY BIOPSY Right 12/30/2017   Procedure: BIOPSY TEMPORAL ARTERY;  Surgeon: Katha Cabal, MD;  Location: [facility] ORS;  Service: Vascular;  Laterality: Right;   ARTERY BIOPSY Left 06/18/2018   Procedure: BIOPSY TEMPORAL ARTERY;  Surgeon: Katha Cabal, MD;  Location: [facility] ORS;  Service: Vascular;  Laterality: Left;   BACK SURGERY     CARDIAC CATHETERIZATION     CATARACT EXTRACTION W/ INTRAOCULAR LENS  IMPLANT, BILATERAL Bilateral 2010   CHOLECYSTECTOMY N/A 02/28/2016   Procedure: LAPAROSCOPIC CHOLECYSTECTOMY WITH INTRAOPERATIVE CHOLANGIOGRAM;  Surgeon: Dia Crawford III, MD;  Location: [facility] ORS;  Service: General;  Laterality: N/A;   LUMBAR FUSION  11/09/2016   L3-L5   SPINE SURGERY     Disc   TONSILLECTOMY        Subjective  Assessment - 11/15/20 1624     Subjective Patient reports feeling some better since fall last week-still hurting some and states no furhter falls    Pertinent History Patient reports history of chronic LBP with past lumbar fusion surgery on 11/09/2016 and most recent lumbar facet joint block procedure on 06/19/2020. She reports past medical history of spondylosis, Lung Cancer, HTN. She reports increased difficulty performing ADL's yet still independent but has paid services who assist with cleaning.    Limitations Standing;Lifting;Walking;House hold activities    Currently in Pain? Yes    Pain Score 4     Pain Location Back    Pain Orientation Posterior;Lower    Pain Descriptors / Indicators Aching    Pain Type Chronic pain    Pain Onset More than a month ago    Pain Frequency Constant    Aggravating Factors  prolonged standing/walking    Pain Relieving Factors Rest, heat, meds    Effect of Pain on Daily Activities Difficulty with walking/ADLS/Shopping    Multiple Pain Sites No              Vitals: BP= 152/89 mmHg Left UE at rest in sitting BP= 149/60  mmHg Standing Left UE   Bridging x 10 on mat table- with VC for 2  sec hold- Patient able to perform well with good control and no pain so progressed today to:  Bridging with LE on 65cm green theraball  x 15 reps with 2 sec hold- Mild unsteadiness initially yet improved overall with practice.  Transverse abs contract with LE on ball  then knee to chest)  Lower trunk rotation  with LE in hooklye on ball x 20 reps TA contraction with dead bug (LE on theraball- lifting)  x 20 reps each direction.  Thoracic rotation in sidelye x 15 reps each side- VC for technique. Quadraped - hold with TA contraction- 5sec x 5reps then progressed to:  [place]- Initially  just alt UE raises but quickly progressed to alt opp UE/LE.  Ardine Eng pose- hold 1 min with no difficulty or pain Downward dog- Patient demonstrated x 5 reps to demo that she could perform  with good form and no pain or difficulty.  Pt educated throughout session about proper posture and technique with exercises. Improved exercise technique, movement at target joints, use of target muscles after min to mod verbal, visual, tactile cues.    Clinical impression: Patient reported feeling less stiff and reported excited that she could perform all activities not only without pain but felt better at end of session. Patient did respond well to core activities and able to progress into different positions today without significant difficulty. Pt will benefit from further skilled PT to improve BLE strength, balance and functional mobility                          PT Education - 11/15/20 1627     Education Details Specific exercise form    Person(s) Educated Patient    Methods Explanation;Demonstration;Tactile cues;Verbal cues    Comprehension Verbalized understanding;Returned demonstration;Verbal cues required;Tactile cues required;Need further instruction              PT Short Term Goals - 10/24/20 1456       PT SHORT TERM GOAL #1   Title Patient will be independent in home exercise program to improve pain relief/ strength/mobility for better functional independence with ADLs.    Baseline 07/17/2020- Patient doesn't have any formal HEP in place. 10/24/2020- Patient reports compliance with current HEP for Low back stretching, core and LE strengthening.    Time 6    Period Weeks    Status Achieved    Target Date 08/28/20               PT Long Term Goals - 10/24/20 1501       PT LONG TERM GOAL #1   Title Patient will increase FOTO score to equal to or greater than 63 to demonstrate statistically significant improvement in mobility and quality of life.    Baseline 07/17/2020: FOTO score=60; 10/24/2020= 40    Time 12    Period Weeks    Status On-going    Target Date 01/16/21      PT LONG TERM GOAL #2   Title Pt will decrease worst back pain as  reported on NPRS by at least 2 points in order to demonstrate clinically significant reduction in back pain.    Baseline 07/17/2020- 5/10 LBP at worst; 08/29/20: reaches around a 7/10 almost daily in the past week. 10/24/2020= Patient rates at a 3/10 current and up to 6/10 at worst.    Time 12    Period Weeks    Status On-going    Target Date 01/16/21  PT LONG TERM GOAL #3   Title Patient (> 64 years old) will complete five times sit to stand test in <13 seconds indicating an increased LE strength and improved balance.   Previously was 18sec goal   Baseline 07/17/2020- 26.02 sec; 08/29/20: 14.34sec  10/24/2020 = 22.45 sec    Time 12    Period Weeks    Status On-going      PT LONG TERM GOAL #4   Title Patient will increase Berg Balance score by > 4 points to demonstrate decreased fall risk during functional activities.   Will update to either a SLS goal or a DGI/FGA goal next session.   Baseline 07/17/2020- BERG = 49/56; 08/29/20: 50/56 (will convert to new balance goal, as pt cannot improve any more (statistically) due to ceiling effect and medical restrictions)    Time 12    Period Weeks    Status Revised      PT LONG TERM GOAL #5   Title Patient will increase 10 meter walk test to >1.31m/s as to improve gait speed for better community ambulation and to reduce fall risk.    Baseline 07/17/2020= 10MWT avg=11.0 sec (0.91 m/s) without an AD; 08/29/20: 1.70m/s self selected, 1.47m/s;    Time 12    Period Weeks    Status Achieved      Additional Long Term Goals   Additional Long Term Goals Yes      PT LONG TERM GOAL #6   Title Patient will increase Functional Gait Assessment score to >23/30 as to reduce fall risk and improve dynamic gait safety with community ambulation.    Baseline 10/24/2020= FGA score of 20/30    Time 12    Period Weeks    Status New    Target Date 01/16/21                   Plan - 11/15/20 1720     Clinical Impression Statement atient reported feeling  less stiff and reported excited that she could perform all activities not only without pain but felt better at end of session. Patient did respond well to core activities and able to progress into different positions today without significant difficulty. Pt will benefit from further skilled PT to improve BLE strength, balance and functional mobility    Personal Factors and Comorbidities Age;Comorbidity 3+    Comorbidities Osteoporosis, HTN, CHF    Examination-Activity Limitations Bend;Caring for Others;Carry;Lift;Reach Overhead;Sit;Squat;Stairs;Stand    Examination-Participation Restrictions Cleaning;Community Activity;Laundry;Shop;Yard Work    Stability/Clinical Decision Making Stable/Uncomplicated    Rehab Potential Good    PT Frequency 2x / week    PT Duration 2 weeks    PT Treatment/Interventions ADLs/Self Care Home Management;Cryotherapy;Moist Heat;DME Instruction;Stair training;Functional mobility training;Therapeutic activities;Therapeutic exercise;Balance training;Neuromuscular re-education;Patient/family education;Manual techniques    PT Next Visit Plan Manual therapy/PROM for low back symptoms, balance/core/LE strengthening and training for improved mobility and function    PT Home Exercise Plan no changes    Consulted and Agree with Plan of Care Patient             Patient will benefit from skilled therapeutic intervention in order to improve the following deficits and impairments:  Abnormal gait, Decreased balance, Decreased coordination, Decreased activity tolerance, Cardiopulmonary status limiting activity, Decreased endurance, Decreased mobility, Decreased strength, Difficulty walking, Pain  Visit Diagnosis: Abnormality of gait and mobility  Difficulty in walking, not elsewhere classified  Muscle weakness (generalized)  Other lack of coordination     Problem List Patient Active  Problem List   Diagnosis Date Noted   Fall (on) (from) other stairs and steps, initial  encounter 08/06/2020   Lumbar facet joint syndrome 06/18/2020   Spondylosis without myelopathy or radiculopathy, lumbosacral region 06/18/2020   DDD (degenerative disc disease), lumbosacral 06/18/2020   COVID 06/04/2020   Chronic pain syndrome 05/16/2020   Pharmacologic therapy 05/16/2020   Disorder of skeletal system 05/16/2020   Problems influencing health status 05/16/2020   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   Chronic hip pain (2ry area of Pain) (Bilateral) (R>L) 05/16/2020   Chronic lower extremity pain (Left) 05/16/2020   Numbness and tingling of both feet (intermittent/recurrent) 05/16/2020   Pain in rib (Bilateral) (R>L) (intermittent) 05/16/2020   Chronic knee pain (Left) 05/16/2020   Patellar pain (Left) (chronic, intermittent) 05/16/2020   Hard of hearing 05/16/2020   Carcinoma, lung, right ([place]) 06/25/2019   S/P partial lobectomy of lung 02/25/2019   Mass of upper lobe of right lung 01/06/2019   Chronic nonintractable headache 06/14/2018   Head ache 12/03/2017   Sinus bradycardia 10/06/2017   Chronic low back pain (1ry area of Pain) (Bilateral) (L>R) w/o sciatica 08/17/2017   Panic attack as reaction to stress 08/17/2017   Anxiety 06/15/2017   Atypical chest pain 06/15/2017   Hyperlipidemia 06/15/2017   Cholecystitis with cholelithiasis 02/27/2016   Increased frequency of urination 01/27/2015   History of night sweats 05/01/2014   Dupuytren's contracture 07/18/2013   Inverted nipple 04/18/2013   Depression 04/23/2010   Fibromyalgia 04/23/2010   Hypercholesterolemia 04/23/2010   Essential hypertension 04/23/2010   Hypothyroidism 04/23/2010   Irritable colon 04/23/2010   Osteoarthritis 04/23/2010    Lewis Moccasin, PT 11/15/2020, 5:21 PM  [place] MAIN [facility] SERVICES [address] [place], [place], [address] Phone: [phone]   Fax:  [phone]  Name: NASHYA GARLINGTON MRN: [id number] Date of Birth:  1941/03/26

## 2020-11-21 ENCOUNTER — Other Ambulatory Visit: Payer: Self-pay | Admitting: Physician Assistant

## 2020-11-21 DIAGNOSIS — R2689 Other abnormalities of gait and mobility: Secondary | ICD-10-CM

## 2020-11-23 ENCOUNTER — Ambulatory Visit: Payer: Medicare Other

## 2020-11-23 ENCOUNTER — Other Ambulatory Visit: Payer: Self-pay

## 2020-11-23 DIAGNOSIS — R278 Other lack of coordination: Secondary | ICD-10-CM

## 2020-11-23 DIAGNOSIS — R269 Unspecified abnormalities of gait and mobility: Secondary | ICD-10-CM | POA: Diagnosis not present

## 2020-11-23 DIAGNOSIS — M6281 Muscle weakness (generalized): Secondary | ICD-10-CM

## 2020-11-23 DIAGNOSIS — R262 Difficulty in walking, not elsewhere classified: Secondary | ICD-10-CM

## 2020-11-23 NOTE — Therapy (Signed)
[place] MAIN [facility] SERVICES [address] [place], [place], [address] Phone: [phone]   Fax:  [phone]  Physical Therapy Treatment  Patient Details  Name: Kendra Benton MRN: [id number] Date of Birth: 06/21/1940 Referring Provider (PT): Dr. Sharia Reeve   Encounter Date: 11/23/2020   PT End of Session - 11/23/20 1115     Visit Number 23    Number of Visits 25    Date for PT Re-Evaluation 01/16/21    Authorization Type Traditional Medicare    Authorization Time Period 07/17/2020- 10/09/2020    PT Start Time 1017    PT Stop Time 1100    PT Time Calculation (min) 43 min    Equipment Utilized During Treatment Gait belt    Activity Tolerance Patient tolerated treatment well    Behavior During Therapy WFL for tasks assessed/performed             Past Medical History:  Diagnosis Date   Allergy    Anxiety    GERD (gastroesophageal reflux disease)    Headache    History of kidney stones    Hypertension    Myocardial infarction ([place])    1997   Osteoporosis    Pneumonia     Past Surgical History:  Procedure Laterality Date   ABDOMINAL HYSTERECTOMY     patient has ovaries   APPENDECTOMY     ARTERY BIOPSY Right 12/30/2017   Procedure: BIOPSY TEMPORAL ARTERY;  Surgeon: Katha Cabal, MD;  Location: [facility] ORS;  Service: Vascular;  Laterality: Right;   ARTERY BIOPSY Left 06/18/2018   Procedure: BIOPSY TEMPORAL ARTERY;  Surgeon: Katha Cabal, MD;  Location: [facility] ORS;  Service: Vascular;  Laterality: Left;   BACK SURGERY     CARDIAC CATHETERIZATION     CATARACT EXTRACTION W/ INTRAOCULAR LENS  IMPLANT, BILATERAL Bilateral 2010   CHOLECYSTECTOMY N/A 02/28/2016   Procedure: LAPAROSCOPIC CHOLECYSTECTOMY WITH INTRAOPERATIVE CHOLANGIOGRAM;  Surgeon: Dia Crawford III, MD;  Location: [facility] ORS;  Service: General;  Laterality: N/A;   LUMBAR FUSION  11/09/2016   L3-L5   SPINE SURGERY     Disc   TONSILLECTOMY      There were no  vitals filed for this visit.   Subjective Assessment - 11/23/20 1023     Subjective Patient reports feeling some better since fall last week-still hurting some and states no furhter falls    Pertinent History Patient reports history of chronic LBP with past lumbar fusion surgery on 11/09/2016 and most recent lumbar facet joint block procedure on 06/19/2020. She reports past medical history of spondylosis, Lung Cancer, HTN. She reports increased difficulty performing ADL's yet still independent but has paid services who assist with cleaning.    Limitations Standing;Lifting;Walking;House hold activities    Pain Onset More than a month ago               *Patient reports 5/10 Low back pain  BP= 136/49mmHg  (sitting at rest Left UE)  HR= 77bpm BP= 127/61 mmHg (standing at rest Left UE)  HR= 79bpm   Bridging x 10 on mat table- with VC for 2 sec hold- Patient able to perform well and states no worsening in back pain  Bridging with Hip add squeeze on ball  x  8 reps with 2 sec hold- VC fo coordination of dual tasks - Patient stopped stating that was tiring.   Bridging with Hip abd using GTB x 10 reps- "I can really feel my core working  on that one." Patient was unsteady yet did improve with reps.   Sidelye Clamshell with GTB x 10 reps - No pain or difficulty  Lower trunk rotation  with LE in hooklye on ball x 20 reps .  Thoracic rotation in sidelye x 10 reps each side- VC for technique including to turn head in direction of top arm.   Ardine Eng pose- hold 1 min with no difficulty or pain  Downward dog- Patient performed 1 min with good form and no pain or difficulty.  Wall squats (mini) with lumbar spine supported against wall x 10 reps.   Wall posture stretch- using PVC pipe to raise UE overhead x  16min hold.   Pt educated throughout session about proper posture and technique with exercises. Improved exercise technique, movement at target joints, use of target muscles after min to mod  verbal, visual, tactile cues.   *Patient reports feeling much better after exercises today. States less than 3/10 low back pain.    Clinical impression:  Patient able to perform all activities well with decreased reported pain. She was able to incorporate core/Low back and LE strengthening  without difficulty- able to follow all VC and visual demonstration well. Pt will benefit from further skilled PT to improve BLE strength, balance and functional mobility                         PT Education - 11/23/20 1114     Education Details exercise technique, postural ed    Person(s) Educated Patient    Methods Explanation;Demonstration;Tactile cues;Verbal cues    Comprehension Verbalized understanding;Returned demonstration;Verbal cues required;Tactile cues required;Need further instruction              PT Short Term Goals - 10/24/20 1456       PT SHORT TERM GOAL #1   Title Patient will be independent in home exercise program to improve pain relief/ strength/mobility for better functional independence with ADLs.    Baseline 07/17/2020- Patient doesn't have any formal HEP in place. 10/24/2020- Patient reports compliance with current HEP for Low back stretching, core and LE strengthening.    Time 6    Period Weeks    Status Achieved    Target Date 08/28/20               PT Long Term Goals - 10/24/20 1501       PT LONG TERM GOAL #1   Title Patient will increase FOTO score to equal to or greater than 63 to demonstrate statistically significant improvement in mobility and quality of life.    Baseline 07/17/2020: FOTO score=60; 10/24/2020= 40    Time 12    Period Weeks    Status On-going    Target Date 01/16/21      PT LONG TERM GOAL #2   Title Pt will decrease worst back pain as reported on NPRS by at least 2 points in order to demonstrate clinically significant reduction in back pain.    Baseline 07/17/2020- 5/10 LBP at worst; 08/29/20: reaches around a 7/10  almost daily in the past week. 10/24/2020= Patient rates at a 3/10 current and up to 6/10 at worst.    Time 12    Period Weeks    Status On-going    Target Date 01/16/21      PT LONG TERM GOAL #3   Title Patient (> 47 years old) will complete five times sit to stand test in <13 seconds indicating  an increased LE strength and improved balance.   Previously was 18sec goal   Baseline 07/17/2020- 26.02 sec; 08/29/20: 14.34sec  10/24/2020 = 22.45 sec    Time 12    Period Weeks    Status On-going      PT LONG TERM GOAL #4   Title Patient will increase Berg Balance score by > 4 points to demonstrate decreased fall risk during functional activities.   Will update to either a SLS goal or a DGI/FGA goal next session.   Baseline 07/17/2020- BERG = 49/56; 08/29/20: 50/56 (will convert to new balance goal, as pt cannot improve any more (statistically) due to ceiling effect and medical restrictions)    Time 12    Period Weeks    Status Revised      PT LONG TERM GOAL #5   Title Patient will increase 10 meter walk test to >1.24m/s as to improve gait speed for better community ambulation and to reduce fall risk.    Baseline 07/17/2020= 10MWT avg=11.0 sec (0.91 m/s) without an AD; 08/29/20: 1.72m/s self selected, 1.78m/s;    Time 12    Period Weeks    Status Achieved      Additional Long Term Goals   Additional Long Term Goals Yes      PT LONG TERM GOAL #6   Title Patient will increase Functional Gait Assessment score to >23/30 as to reduce fall risk and improve dynamic gait safety with community ambulation.    Baseline 10/24/2020= FGA score of 20/30    Time 12    Period Weeks    Status New    Target Date 01/16/21                   Plan - 11/23/20 1113     Clinical Impression Statement Patient able to perform all activities well with decreased reported pain. She was able to incorporate core/Low back and LE strengthening  without difficulty- able to follow all VC and visual demonstration  well. Pt will benefit from further skilled PT to improve BLE strength, balance and functional mobility    Personal Factors and Comorbidities Age;Comorbidity 3+    Comorbidities Osteoporosis, HTN, CHF    Examination-Activity Limitations Bend;Caring for Others;Carry;Lift;Reach Overhead;Sit;Squat;Stairs;Stand    Examination-Participation Restrictions Cleaning;Community Activity;Laundry;Shop;Yard Work    Stability/Clinical Decision Making Stable/Uncomplicated    Rehab Potential Good    PT Frequency 2x / week    PT Duration 2 weeks    PT Treatment/Interventions ADLs/Self Care Home Management;Cryotherapy;Moist Heat;DME Instruction;Stair training;Functional mobility training;Therapeutic activities;Therapeutic exercise;Balance training;Neuromuscular re-education;Patient/family education;Manual techniques    PT Next Visit Plan Manual therapy/PROM for low back symptoms, balance/core/LE strengthening and training for improved mobility and function    PT Home Exercise Plan no changes    Consulted and Agree with Plan of Care Patient             Patient will benefit from skilled therapeutic intervention in order to improve the following deficits and impairments:  Abnormal gait, Decreased balance, Decreased coordination, Decreased activity tolerance, Cardiopulmonary status limiting activity, Decreased endurance, Decreased mobility, Decreased strength, Difficulty walking, Pain  Visit Diagnosis: Abnormality of gait and mobility  Difficulty in walking, not elsewhere classified  Muscle weakness (generalized)  Other lack of coordination     Problem List Patient Active Problem List   Diagnosis Date Noted   Fall (on) (from) other stairs and steps, initial encounter 08/06/2020   Lumbar facet joint syndrome 06/18/2020   Spondylosis without myelopathy or radiculopathy, lumbosacral region  06/18/2020   DDD (degenerative disc disease), lumbosacral 06/18/2020   COVID 06/04/2020   Chronic pain syndrome  05/16/2020   Pharmacologic therapy 05/16/2020   Disorder of skeletal system 05/16/2020   Problems influencing health status 05/16/2020   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   Chronic hip pain (2ry area of Pain) (Bilateral) (R>L) 05/16/2020   Chronic lower extremity pain (Left) 05/16/2020   Numbness and tingling of both feet (intermittent/recurrent) 05/16/2020   Pain in rib (Bilateral) (R>L) (intermittent) 05/16/2020   Chronic knee pain (Left) 05/16/2020   Patellar pain (Left) (chronic, intermittent) 05/16/2020   Hard of hearing 05/16/2020   Carcinoma, lung, right ([place]) 06/25/2019   S/P partial lobectomy of lung 02/25/2019   Mass of upper lobe of right lung 01/06/2019   Chronic nonintractable headache 06/14/2018   Head ache 12/03/2017   Sinus bradycardia 10/06/2017   Chronic low back pain (1ry area of Pain) (Bilateral) (L>R) w/o sciatica 08/17/2017   Panic attack as reaction to stress 08/17/2017   Anxiety 06/15/2017   Atypical chest pain 06/15/2017   Hyperlipidemia 06/15/2017   Cholecystitis with cholelithiasis 02/27/2016   Increased frequency of urination 01/27/2015   History of night sweats 05/01/2014   Dupuytren's contracture 07/18/2013   Inverted nipple 04/18/2013   Depression 04/23/2010   Fibromyalgia 04/23/2010   Hypercholesterolemia 04/23/2010   Essential hypertension 04/23/2010   Hypothyroidism 04/23/2010   Irritable colon 04/23/2010   Osteoarthritis 04/23/2010    Lewis Moccasin, PT 11/23/2020, 11:16 AM  [place] MAIN [facility] SERVICES [address] [place], [place], [address] Phone: [phone]   Fax:  [phone]  Name: Kendra Benton MRN: [id number] Date of Birth: [birth year]

## 2020-11-27 ENCOUNTER — Ambulatory Visit: Payer: Medicare Other

## 2020-11-27 ENCOUNTER — Other Ambulatory Visit: Payer: Self-pay

## 2020-11-27 DIAGNOSIS — R278 Other lack of coordination: Secondary | ICD-10-CM

## 2020-11-27 DIAGNOSIS — R262 Difficulty in walking, not elsewhere classified: Secondary | ICD-10-CM

## 2020-11-27 DIAGNOSIS — M6281 Muscle weakness (generalized): Secondary | ICD-10-CM

## 2020-11-27 DIAGNOSIS — R269 Unspecified abnormalities of gait and mobility: Secondary | ICD-10-CM | POA: Diagnosis not present

## 2020-11-27 NOTE — Therapy (Signed)
[place] MAIN [facility] SERVICES [address] [place], [place], [address] Phone: [phone]   Fax:  [phone]  Physical Therapy Treatment  Patient Details  Name: Kendra Benton MRN: [id number] Date of Birth: June 20, 1940 Referring Provider (PT): Dr. Sharia Reeve   Encounter Date: 11/27/2020   PT End of Session - 11/27/20 0925     Visit Number 24    Number of Visits 50    Date for PT Re-Evaluation 01/16/21    Authorization Type Traditional Medicare    Authorization Time Period 07/17/2020- 10/09/2020    PT Start Time 9326    PT Stop Time 1000    PT Time Calculation (min) 42 min    Equipment Utilized During Treatment Gait belt    Activity Tolerance Patient tolerated treatment well    Behavior During Therapy [facility] for tasks assessed/performed             Past Medical History:  Diagnosis Date   Allergy    Anxiety    GERD (gastroesophageal reflux disease)    Headache    History of kidney stones    Hypertension    Myocardial infarction [facility])    1997   Osteoporosis    Pneumonia     Past Surgical History:  Procedure Laterality Date   ABDOMINAL HYSTERECTOMY     patient has ovaries   APPENDECTOMY     ARTERY BIOPSY Right 12/30/2017   Procedure: BIOPSY TEMPORAL ARTERY;  Surgeon: Katha Cabal, MD;  Location: [facility] ORS;  Service: Vascular;  Laterality: Right;   ARTERY BIOPSY Left 06/18/2018   Procedure: BIOPSY TEMPORAL ARTERY;  Surgeon: Katha Cabal, MD;  Location: [facility] ORS;  Service: Vascular;  Laterality: Left;   BACK SURGERY     CARDIAC CATHETERIZATION     CATARACT EXTRACTION W/ INTRAOCULAR LENS  IMPLANT, BILATERAL Bilateral 2010   CHOLECYSTECTOMY N/A 02/28/2016   Procedure: LAPAROSCOPIC CHOLECYSTECTOMY WITH INTRAOPERATIVE CHOLANGIOGRAM;  Surgeon: Dia Crawford III, MD;  Location: [facility] ORS;  Service: General;  Laterality: N/A;   LUMBAR FUSION  11/09/2016   L3-L5   SPINE SURGERY     Disc   TONSILLECTOMY      There were no  vitals filed for this visit.   Subjective Assessment - 11/27/20 0923     Subjective Patient reports ongoing low back pain- stating 4/10 and hurting after she was trying to organize her home- moving items around.    Pertinent History Patient reports history of chronic LBP with past lumbar fusion surgery on 11/09/2016 and most recent lumbar facet joint block procedure on 06/19/2020. She reports past medical history of spondylosis, Lung Cancer, HTN. She reports increased difficulty performing ADL's yet still independent but has paid services who assist with cleaning.    Limitations Standing;Lifting;Walking;House hold activities    Currently in Pain? Yes    Pain Score 4     Pain Location Back    Pain Descriptors / Indicators Aching    Pain Onset More than a month ago    Pain Frequency Constant    Aggravating Factors  prolonged standing/walking, Lifting, bending    Pain Relieving Factors Resting, Heat, meds    Effect of Pain on Daily Activities Difficulty with ADLs    Multiple Pain Sites No             Interventions:   Manual stretching- B hamstrings- Hold 30 sec x 3 each leg Neural glide (Sciatic nerve flossing) x 20 reps x 2 sets each leg.  Single knee to chest - hold 20 sec x 3  Piriformis - hold 20 sec x 3  Lower trunk rotation x 20 sec each   Instruction in body mechanics and lifting today-   Set up multiple objects including 10 lb box, 5 lb box, 300g ball, cell phone, and cone and instructed patient in body mechanics including- approaching each object as close as possible; how to squat properly and lift or perform golfers lean when appropriate; and how to lift without twisting. Patient able to verbalize understanding of lifting techniques and demo with objects mentioned above today.   Education provided throughout session via VC/TC and demonstration to facilitate movement at target joints and correct muscle activation for all testing and exercises performed.   Clinical Impression:  Treatment today focused on body mechanics and simulation of lifting objects to instruct in correct technique. Patient able to follow instructions and perform correctly and denied any back pain during instruction. Overall, patient is still having some chronic low back pain and since she lives alone is likely trying to do too much as housework and IADLs  increasing her risk for continued pain. She was able to learn some techniques to put into practice at home to see if changing her body mechanics with everyday tasks will decrease risk of pain or injury. Pt will benefit from further skilled PT to improve BLE strength, balance and functional mobility                       PT Education - 11/27/20 0924     Education Details Body mechanics, lifting techniques,  and exercise technique    Person(s) Educated Patient    Methods Explanation;Demonstration;Tactile cues;Verbal cues    Comprehension Verbalized understanding;Returned demonstration;Verbal cues required;Need further instruction;Tactile cues required              PT Short Term Goals - 10/24/20 1456       PT SHORT TERM GOAL #1   Title Patient will be independent in home exercise program to improve pain relief/ strength/mobility for better functional independence with ADLs.    Baseline 07/17/2020- Patient doesn't have any formal HEP in place. 10/24/2020- Patient reports compliance with current HEP for Low back stretching, core and LE strengthening.    Time 6    Period Weeks    Status Achieved    Target Date 08/28/20               PT Long Term Goals - 10/24/20 1501       PT LONG TERM GOAL #1   Title Patient will increase FOTO score to equal to or greater than 63 to demonstrate statistically significant improvement in mobility and quality of life.    Baseline 07/17/2020: FOTO score=60; 10/24/2020= 40    Time 12    Period Weeks    Status On-going    Target Date 01/16/21      PT LONG TERM GOAL #2   Title Pt will  decrease worst back pain as reported on NPRS by at least 2 points in order to demonstrate clinically significant reduction in back pain.    Baseline 07/17/2020- 5/10 LBP at worst; 08/29/20: reaches around a 7/10 almost daily in the past week. 10/24/2020= Patient rates at a 3/10 current and up to 6/10 at worst.    Time 12    Period Weeks    Status On-going    Target Date 01/16/21      PT LONG TERM GOAL #  3   Title Patient (> 5 years old) will complete five times sit to stand test in <13 seconds indicating an increased LE strength and improved balance.   Previously was 18sec goal   Baseline 07/17/2020- 26.02 sec; 08/29/20: 14.34sec  10/24/2020 = 22.45 sec    Time 12    Period Weeks    Status On-going      PT LONG TERM GOAL #4   Title Patient will increase Berg Balance score by > 4 points to demonstrate decreased fall risk during functional activities.   Will update to either a SLS goal or a DGI/FGA goal next session.   Baseline 07/17/2020- BERG = 49/56; 08/29/20: 50/56 (will convert to new balance goal, as pt cannot improve any more (statistically) due to ceiling effect and medical restrictions)    Time 12    Period Weeks    Status Revised      PT LONG TERM GOAL #5   Title Patient will increase 10 meter walk test to >1.13m/s as to improve gait speed for better community ambulation and to reduce fall risk.    Baseline 07/17/2020= 10MWT avg=11.0 sec (0.91 m/s) without an AD; 08/29/20: 1.44m/s self selected, 1.70m/s;    Time 12    Period Weeks    Status Achieved      Additional Long Term Goals   Additional Long Term Goals Yes      PT LONG TERM GOAL #6   Title Patient will increase Functional Gait Assessment score to >23/30 as to reduce fall risk and improve dynamic gait safety with community ambulation.    Baseline 10/24/2020= FGA score of 20/30    Time 12    Period Weeks    Status New    Target Date 01/16/21                   Plan - 11/27/20 1146     Clinical Impression  Statement reatment today focused on body mechanics and simulation of lifting objects to instruct in correct technique. Patient able to follow instructions and perform correctly and denied any back pain during instruction. Overall, patient is still having some chronic low back pain and since she lives alone is likely trying to do too much as housework and IADLs  increasing her risk for continued pain. She was able to learn some techniques to put into practice at home to see if changing her body mechanics with everyday tasks will decrease risk of pain or injury. Pt will benefit from further skilled PT to improve BLE strength, balance and functional mobility    Personal Factors and Comorbidities Age;Comorbidity 3+    Comorbidities Osteoporosis, HTN, CHF    Examination-Activity Limitations Bend;Caring for Others;Carry;Lift;Reach Overhead;Sit;Squat;Stairs;Stand    Examination-Participation Restrictions Cleaning;Community Activity;Laundry;Shop;Yard Work    [employer] Stable/Uncomplicated    Clinical Decision Making High    Rehab Potential Good    PT Frequency 2x / week    PT Duration 2 weeks    PT Treatment/Interventions ADLs/Self Care Home Management;Cryotherapy;Moist Heat;DME Instruction;Stair training;Functional mobility training;Therapeutic activities;Therapeutic exercise;Balance training;Neuromuscular re-education;Patient/family education;Manual techniques    PT Next Visit Plan Manual therapy/PROM for low back symptoms, balance/core/LE strengthening and training for improved mobility and function    PT Home Exercise Plan Instructed in body mechanics and lifting techniques.    Consulted and Agree with Plan of Care Patient             Patient will benefit from skilled therapeutic intervention in order to improve the  following deficits and impairments:  Abnormal gait, Decreased balance, Decreased coordination, Decreased activity tolerance, Cardiopulmonary status limiting  activity, Decreased endurance, Decreased mobility, Decreased strength, Difficulty walking, Pain  Visit Diagnosis: Abnormality of gait and mobility  Difficulty in walking, not elsewhere classified  Muscle weakness (generalized)  Other lack of coordination     Problem List Patient Active Problem List   Diagnosis Date Noted   Fall (on) (from) other stairs and steps, initial encounter 08/06/2020   Lumbar facet joint syndrome 06/18/2020   Spondylosis without myelopathy or radiculopathy, lumbosacral region 06/18/2020   DDD (degenerative disc disease), lumbosacral 06/18/2020   COVID 06/04/2020   Chronic pain syndrome 05/16/2020   Pharmacologic therapy 05/16/2020   Disorder of skeletal system 05/16/2020   Problems influencing health status 05/16/2020   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   Chronic hip pain (2ry area of Pain) (Bilateral) (R>L) 05/16/2020   Chronic lower extremity pain (Left) 05/16/2020   Numbness and tingling of both feet (intermittent/recurrent) 05/16/2020   Pain in rib (Bilateral) (R>L) (intermittent) 05/16/2020   Chronic knee pain (Left) 05/16/2020   Patellar pain (Left) (chronic, intermittent) 05/16/2020   Hard of hearing 05/16/2020   Carcinoma, lung, right ([place]) 06/25/2019   S/P partial lobectomy of lung 02/25/2019   Mass of upper lobe of right lung 01/06/2019   Chronic nonintractable headache 06/14/2018   Head ache 12/03/2017   Sinus bradycardia 10/06/2017   Chronic low back pain (1ry area of Pain) (Bilateral) (L>R) w/o sciatica 08/17/2017   Panic attack as reaction to stress 08/17/2017   Anxiety 06/15/2017   Atypical chest pain 06/15/2017   Hyperlipidemia 06/15/2017   Cholecystitis with cholelithiasis 02/27/2016   Increased frequency of urination 01/27/2015   History of night sweats 05/01/2014   Dupuytren's contracture 07/18/2013   Inverted nipple 04/18/2013   Depression 04/23/2010   Fibromyalgia 04/23/2010   Hypercholesterolemia 04/23/2010    Essential hypertension 04/23/2010   Hypothyroidism 04/23/2010   Irritable colon 04/23/2010   Osteoarthritis 04/23/2010    Lewis Moccasin, PT 11/27/2020, 9:32 PM  [place] MAIN [facility] SERVICES [address] [place], [place], [address] Phone: [phone]   Fax:  [phone]  Name: VERNESSA LIKES MRN: [id number] Date of Birth: 29-Sep-1940

## 2020-11-29 ENCOUNTER — Ambulatory Visit
Admission: RE | Admit: 2020-11-29 | Discharge: 2020-11-29 | Disposition: A | Discharge status: Home / Self Care | Payer: Medicare Other | Source: Ambulatory Visit | Attending: Physician Assistant | Admitting: Physician Assistant

## 2020-11-29 ENCOUNTER — Other Ambulatory Visit: Payer: Self-pay

## 2020-11-29 DIAGNOSIS — R2689 Other abnormalities of gait and mobility: Secondary | ICD-10-CM | POA: Diagnosis not present

## 2020-11-29 MED ORDER — GADOBUTROL 1 MMOL/ML IV SOLN
5.0000 mL | Freq: Once | INTRAVENOUS | Status: AC | PRN
  Administered 2020-11-29: 5 mL via INTRAVENOUS

## 2020-11-30 ENCOUNTER — Ambulatory Visit: Payer: Medicare Other

## 2020-12-04 ENCOUNTER — Other Ambulatory Visit: Payer: Self-pay

## 2020-12-04 ENCOUNTER — Ambulatory Visit: Payer: Medicare Other

## 2020-12-04 DIAGNOSIS — R262 Difficulty in walking, not elsewhere classified: Secondary | ICD-10-CM

## 2020-12-04 DIAGNOSIS — R269 Unspecified abnormalities of gait and mobility: Secondary | ICD-10-CM | POA: Diagnosis not present

## 2020-12-04 DIAGNOSIS — M6281 Muscle weakness (generalized): Secondary | ICD-10-CM

## 2020-12-04 NOTE — Therapy (Signed)
[place] MAIN [facility] SERVICES [address] [place], [place], [address] Phone: [phone]   Fax:  [phone]  Physical Therapy Treatment  Patient Details  Name: Kendra Benton MRN: [id number] Date of Birth: 07-29-1940 Referring Provider (PT): Dr. Sharia Reeve   Encounter Date: 12/04/2020   PT End of Session - 12/04/20 1624     Visit Number 25    Number of Visits 50    Date for PT Re-Evaluation 01/16/21    Authorization Type Traditional Medicare    Authorization Time Period 07/17/2020- 10/09/2020    PT Start Time 1615    PT Stop Time 1700    PT Time Calculation (min) 45 min    Equipment Utilized During Treatment Gait belt    Activity Tolerance Patient tolerated treatment well    Behavior During Therapy WFL for tasks assessed/performed             Past Medical History:  Diagnosis Date   Allergy    Anxiety    GERD (gastroesophageal reflux disease)    Headache    History of kidney stones    Hypertension    Myocardial infarction ([place])    1997   Osteoporosis    Pneumonia     Past Surgical History:  Procedure Laterality Date   ABDOMINAL HYSTERECTOMY     patient has ovaries   APPENDECTOMY     ARTERY BIOPSY Right 12/30/2017   Procedure: BIOPSY TEMPORAL ARTERY;  Surgeon: Katha Cabal, MD;  Location: [facility] ORS;  Service: Vascular;  Laterality: Right;   ARTERY BIOPSY Left 06/18/2018   Procedure: BIOPSY TEMPORAL ARTERY;  Surgeon: Katha Cabal, MD;  Location: [facility] ORS;  Service: Vascular;  Laterality: Left;   BACK SURGERY     CARDIAC CATHETERIZATION     CATARACT EXTRACTION W/ INTRAOCULAR LENS  IMPLANT, BILATERAL Bilateral 2010   CHOLECYSTECTOMY N/A 02/28/2016   Procedure: LAPAROSCOPIC CHOLECYSTECTOMY WITH INTRAOPERATIVE CHOLANGIOGRAM;  Surgeon: Dia Crawford III, MD;  Location: [facility] ORS;  Service: General;  Laterality: N/A;   LUMBAR FUSION  11/09/2016   L3-L5   SPINE SURGERY     Disc   TONSILLECTOMY      There were no  vitals filed for this visit.   Subjective Assessment - 12/04/20 1620     Subjective Patient reports feeling okay today. Reports she has been stretching and trying to stay active. Reports she is still waiting on her results from the MRI.    Pertinent History Patient reports history of chronic LBP with past lumbar fusion surgery on 11/09/2016 and most recent lumbar facet joint block procedure on 06/19/2020. She reports past medical history of spondylosis, Lung Cancer, HTN. She reports increased difficulty performing ADL's yet still independent but has paid services who assist with cleaning.    Limitations Standing;Lifting;Walking;House hold activities    Currently in Pain? Yes    Pain Score 3     Pain Location Back    Pain Orientation Posterior    Pain Descriptors / Indicators Aching    Pain Type Chronic pain    Pain Onset More than a month ago    Pain Frequency Constant    Aggravating Factors  prolonged standing/walking, Lifting, bending    Pain Relieving Factors Rest, head, Meds    Effect of Pain on Daily Activities Difficulty with ADls              Interventions:   Soft tissue Mobilization to right sided low back paraspinals. Patient presents with increased  tenderness along T12-L5 region. Minimized pressure due to patient sensitivity.   Core stabilization:   Quadraped position: Set designer (alt Opp UE/LE) with maintaining Transverse abdominal contraction x 10 reps each side.  - with hip ER alt LE x 10 reps each.   Quadraped to childs pose position- hold 30 sec x 2 reps.   Prone press up (patient declined after attempting stating increased pain)  Plank on forearms- hold 15 sec x 3 sets  Attempted side plank- Patient unable to attain position.   Sitting on theraball- alt UE x 15 reps with core contracted.  Sitting on theraball- alt hip march x 10 reps (patient exhibited increased difficulty with maintaining sitting position)  Sitting on theraball- alt knee ext x 10 reps (patient  difficulty balancing and coordinating movement).  *patient did reports some Low back soreness after performing all activities.  Education provided throughout session via VC/TC and demonstration to facilitate movement at target joints and correct muscle activation for all testing and exercises performed.   Clinical Impression: Patient challenged with core activities - unable to achieve certain positions but well motivated to attempt. She has a strong yoga background and recognized importance of flexibility in low back/LE as well as developing a core exercise plan. She was able to participate well but did report low back soreness after session. Patient will benefit from further skilled PT to improve BLE strength, balance, and functional mobility.                         PT Education - 12/04/20 1624     Education Details exercise technique    Person(s) Educated Patient    Methods Explanation;Demonstration;Tactile cues;Verbal cues    Comprehension Verbalized understanding;Returned demonstration;Verbal cues required;Tactile cues required;Need further instruction              PT Short Term Goals - 10/24/20 1456       PT SHORT TERM GOAL #1   Title Patient will be independent in home exercise program to improve pain relief/ strength/mobility for better functional independence with ADLs.    Baseline 07/17/2020- Patient doesn't have any formal HEP in place. 10/24/2020- Patient reports compliance with current HEP for Low back stretching, core and LE strengthening.    Time 6    Period Weeks    Status Achieved    Target Date 08/28/20               PT Long Term Goals - 10/24/20 1501       PT LONG TERM GOAL #1   Title Patient will increase FOTO score to equal to or greater than 63 to demonstrate statistically significant improvement in mobility and quality of life.    Baseline 07/17/2020: FOTO score=60; 10/24/2020= 40    Time 12    Period Weeks    Status On-going     Target Date 01/16/21      PT LONG TERM GOAL #2   Title Pt will decrease worst back pain as reported on NPRS by at least 2 points in order to demonstrate clinically significant reduction in back pain.    Baseline 07/17/2020- 5/10 LBP at worst; 08/29/20: reaches around a 7/10 almost daily in the past week. 10/24/2020= Patient rates at a 3/10 current and up to 6/10 at worst.    Time 12    Period Weeks    Status On-going    Target Date 01/16/21      PT LONG TERM GOAL #3  Title Patient (> 48 years old) will complete five times sit to stand test in <13 seconds indicating an increased LE strength and improved balance.   Previously was 18sec goal   Baseline 07/17/2020- 26.02 sec; 08/29/20: 14.34sec  10/24/2020 = 22.45 sec    Time 12    Period Weeks    Status On-going      PT LONG TERM GOAL #4   Title Patient will increase Berg Balance score by > 4 points to demonstrate decreased fall risk during functional activities.   Will update to either a SLS goal or a DGI/FGA goal next session.   Baseline 07/17/2020- BERG = 49/56; 08/29/20: 50/56 (will convert to new balance goal, as pt cannot improve any more (statistically) due to ceiling effect and medical restrictions)    Time 12    Period Weeks    Status Revised      PT LONG TERM GOAL #5   Title Patient will increase 10 meter walk test to >1.93m/s as to improve gait speed for better community ambulation and to reduce fall risk.    Baseline 07/17/2020= 10MWT avg=11.0 sec (0.91 m/s) without an AD; 08/29/20: 1.72m/s self selected, 1.20m/s;    Time 12    Period Weeks    Status Achieved      Additional Long Term Goals   Additional Long Term Goals Yes      PT LONG TERM GOAL #6   Title Patient will increase Functional Gait Assessment score to >23/30 as to reduce fall risk and improve dynamic gait safety with community ambulation.    Baseline 10/24/2020= FGA score of 20/30    Time 12    Period Weeks    Status New    Target Date 01/16/21                    Plan - 12/04/20 1625     Clinical Impression Statement Patient challenged with core activities - unable to achieve certain positions but well motivated to attempt. She has a strong yoga background and recognized importance of flexibility in low back/LE as well as developing a core exercise plan. She was able to participate well but did report low back soreness after session. Patient will benefit from further skilled PT to improve BLE strength, balance, and functional mobility.    Personal Factors and Comorbidities Age;Comorbidity 3+    Comorbidities Osteoporosis, HTN, CHF    Examination-Activity Limitations Bend;Caring for Others;Carry;Lift;Reach Overhead;Sit;Squat;Stairs;Stand    Examination-Participation Restrictions Cleaning;Community Activity;Laundry;Shop;Yard Work    Stability/Clinical Decision Making Stable/Uncomplicated    Rehab Potential Good    PT Frequency 2x / week    PT Duration 2 weeks    PT Treatment/Interventions ADLs/Self Care Home Management;Cryotherapy;Moist Heat;DME Instruction;Stair training;Functional mobility training;Therapeutic activities;Therapeutic exercise;Balance training;Neuromuscular re-education;Patient/family education;Manual techniques    PT Next Visit Plan Manual therapy/PROM for low back symptoms, balance/core/LE strengthening and training for improved mobility and function    Consulted and Agree with Plan of Care Patient             Patient will benefit from skilled therapeutic intervention in order to improve the following deficits and impairments:  Abnormal gait, Decreased balance, Decreased coordination, Decreased activity tolerance, Cardiopulmonary status limiting activity, Decreased endurance, Decreased mobility, Decreased strength, Difficulty walking, Pain  Visit Diagnosis: Abnormality of gait and mobility  Muscle weakness (generalized)  Difficulty in walking, not elsewhere classified     Problem List Patient Active Problem  List   Diagnosis Date Noted   Fall (on) (  from) other stairs and steps, initial encounter 08/06/2020   Lumbar facet joint syndrome 06/18/2020   Spondylosis without myelopathy or radiculopathy, lumbosacral region 06/18/2020   DDD (degenerative disc disease), lumbosacral 06/18/2020   COVID 06/04/2020   Chronic pain syndrome 05/16/2020   Pharmacologic therapy 05/16/2020   Disorder of skeletal system 05/16/2020   Problems influencing health status 05/16/2020   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   Chronic hip pain (2ry area of Pain) (Bilateral) (R>L) 05/16/2020   Chronic lower extremity pain (Left) 05/16/2020   Numbness and tingling of both feet (intermittent/recurrent) 05/16/2020   Pain in rib (Bilateral) (R>L) (intermittent) 05/16/2020   Chronic knee pain (Left) 05/16/2020   Patellar pain (Left) (chronic, intermittent) 05/16/2020   Hard of hearing 05/16/2020   Carcinoma, lung, right ([place]) 06/25/2019   S/P partial lobectomy of lung 02/25/2019   Mass of upper lobe of right lung 01/06/2019   Chronic nonintractable headache 06/14/2018   Head ache 12/03/2017   Sinus bradycardia 10/06/2017   Chronic low back pain (1ry area of Pain) (Bilateral) (L>R) w/o sciatica 08/17/2017   Panic attack as reaction to stress 08/17/2017   Anxiety 06/15/2017   Atypical chest pain 06/15/2017   Hyperlipidemia 06/15/2017   Cholecystitis with cholelithiasis 02/27/2016   Increased frequency of urination 01/27/2015   History of night sweats 05/01/2014   Dupuytren's contracture 07/18/2013   Inverted nipple 04/18/2013   Depression 04/23/2010   Fibromyalgia 04/23/2010   Hypercholesterolemia 04/23/2010   Essential hypertension 04/23/2010   Hypothyroidism 04/23/2010   Irritable colon 04/23/2010   Osteoarthritis 04/23/2010    Lewis Moccasin, PT 12/04/2020, 5:29 PM  [facility] MAIN [facility] SERVICES [address] [place], [place], [address] Phone: [phone]    Fax:  [phone]  Name: Kendra Benton MRN: [id number] Date of Birth: 1940-06-23

## 2020-12-11 ENCOUNTER — Ambulatory Visit: Payer: Medicare Other | Attending: Internal Medicine

## 2020-12-11 ENCOUNTER — Other Ambulatory Visit: Payer: Self-pay

## 2020-12-11 DIAGNOSIS — M6281 Muscle weakness (generalized): Secondary | ICD-10-CM | POA: Diagnosis present

## 2020-12-11 DIAGNOSIS — R262 Difficulty in walking, not elsewhere classified: Secondary | ICD-10-CM | POA: Diagnosis present

## 2020-12-11 DIAGNOSIS — R269 Unspecified abnormalities of gait and mobility: Secondary | ICD-10-CM | POA: Diagnosis not present

## 2020-12-11 DIAGNOSIS — R278 Other lack of coordination: Secondary | ICD-10-CM | POA: Insufficient documentation

## 2020-12-11 DIAGNOSIS — R2681 Unsteadiness on feet: Secondary | ICD-10-CM | POA: Insufficient documentation

## 2020-12-11 NOTE — Therapy (Signed)
[place] MAIN [facility] SERVICES [address] [place], [place], [address] Phone: [phone]   Fax:  [phone]  Physical Therapy Treatment  Patient Details  Name: Kendra Benton MRN: [id number] Date of Birth: 08-08-1940 Referring Provider (PT): Dr. Sharia Reeve   Encounter Date: 12/11/2020   PT End of Session - 12/11/20 1211     Visit Number 26    Number of Visits 50    Date for PT Re-Evaluation 01/16/21    Authorization Type Traditional Medicare    Authorization Time Period 07/17/2020- 10/09/2020    PT Start Time 1147    PT Stop Time 1230    PT Time Calculation (min) 43 min    Equipment Utilized During Treatment Gait belt    Activity Tolerance Patient tolerated treatment well    Behavior During Therapy WFL for tasks assessed/performed             Past Medical History:  Diagnosis Date   Allergy    Anxiety    GERD (gastroesophageal reflux disease)    Headache    History of kidney stones    Hypertension    Myocardial infarction ([place])    1997   Osteoporosis    Pneumonia     Past Surgical History:  Procedure Laterality Date   ABDOMINAL HYSTERECTOMY     patient has ovaries   APPENDECTOMY     ARTERY BIOPSY Right 12/30/2017   Procedure: BIOPSY TEMPORAL ARTERY;  Surgeon: Katha Cabal, MD;  Location: [facility] ORS;  Service: Vascular;  Laterality: Right;   ARTERY BIOPSY Left 06/18/2018   Procedure: BIOPSY TEMPORAL ARTERY;  Surgeon: Katha Cabal, MD;  Location: [facility] ORS;  Service: Vascular;  Laterality: Left;   BACK SURGERY     CARDIAC CATHETERIZATION     CATARACT EXTRACTION W/ INTRAOCULAR LENS  IMPLANT, BILATERAL Bilateral 2010   CHOLECYSTECTOMY N/A 02/28/2016   Procedure: LAPAROSCOPIC CHOLECYSTECTOMY WITH INTRAOPERATIVE CHOLANGIOGRAM;  Surgeon: Dia Crawford III, MD;  Location: [facility] ORS;  Service: General;  Laterality: N/A;   LUMBAR FUSION  11/09/2016   L3-L5   SPINE SURGERY     Disc   TONSILLECTOMY      There were no  vitals filed for this visit.   Subjective Assessment - 12/11/20 1153     Subjective Patient reports feeling about the same- Reports back pain between a 3-4/10    Pertinent History Patient reports history of chronic LBP with past lumbar fusion surgery on 11/09/2016 and most recent lumbar facet joint block procedure on 06/19/2020. She reports past medical history of spondylosis, Lung Cancer, HTN. She reports increased difficulty performing ADL's yet still independent but has paid services who assist with cleaning.    Limitations Standing;Lifting;Walking;House hold activities    Currently in Pain? Yes    Pain Score 4     Pain Location Back    Pain Orientation Posterior    Pain Descriptors / Indicators Aching    Pain Type Chronic pain    Pain Onset More than a month ago    Pain Frequency Constant    Aggravating Factors  prolonged standing/walking, lifting, carrying groceries    Pain Relieving Factors Rest, meds    Effect of Pain on Daily Activities Difficulty with mobility and ADLs              Interventions  PROM to lumbar/hips: Double knee to chest, Lower trunk rotation, hamstrings, Piriformis x 30 sec hold BLE x 3 sets each.   Bridging with LE  on threraball x 10 reps Sidelye Hip ER with GTB and TA contraction x 10 reps B LE.  Supine Hip ER with GTB and TA contraction x 10 reps B LE.   Quadraped position: Alternating LE hip extension B LE x 8 reps.  Ardine Eng pose x 1 min Patient experienced SOB  so terminated activity.  Education provided throughout session via VC/TC and demonstration to facilitate movement at target joints and correct muscle activation for all testing and exercises performed.   Clinical Impression: Patient denied any worsening of low back pain and completed involved tasks well with VC and visual demo. Patient remains motivated to work on core stabilization activities to help manage her chronic low back symptoms. Pt will benefit from further skilled PT to improve BLE  strength, balance and functional mobility                         PT Education - 12/11/20 1301     Education Details core stabilization ed    Person(s) Educated Patient    Methods Explanation;Demonstration;Tactile cues;Verbal cues    Comprehension Verbalized understanding;Returned demonstration;Verbal cues required;Need further instruction;Tactile cues required              PT Short Term Goals - 10/24/20 1456       PT SHORT TERM GOAL #1   Title Patient will be independent in home exercise program to improve pain relief/ strength/mobility for better functional independence with ADLs.    Baseline 07/17/2020- Patient doesn't have any formal HEP in place. 10/24/2020- Patient reports compliance with current HEP for Low back stretching, core and LE strengthening.    Time 6    Period Weeks    Status Achieved    Target Date 08/28/20               PT Long Term Goals - 10/24/20 1501       PT LONG TERM GOAL #1   Title Patient will increase FOTO score to equal to or greater than 63 to demonstrate statistically significant improvement in mobility and quality of life.    Baseline 07/17/2020: FOTO score=60; 10/24/2020= 40    Time 12    Period Weeks    Status On-going    Target Date 01/16/21      PT LONG TERM GOAL #2   Title Pt will decrease worst back pain as reported on NPRS by at least 2 points in order to demonstrate clinically significant reduction in back pain.    Baseline 07/17/2020- 5/10 LBP at worst; 08/29/20: reaches around a 7/10 almost daily in the past week. 10/24/2020= Patient rates at a 3/10 current and up to 6/10 at worst.    Time 12    Period Weeks    Status On-going    Target Date 01/16/21      PT LONG TERM GOAL #3   Title Patient (> 82 years old) will complete five times sit to stand test in <13 seconds indicating an increased LE strength and improved balance.   Previously was 18sec goal   Baseline 07/17/2020- 26.02 sec; 08/29/20: 14.34sec   10/24/2020 = 22.45 sec    Time 12    Period Weeks    Status On-going      PT LONG TERM GOAL #4   Title Patient will increase Berg Balance score by > 4 points to demonstrate decreased fall risk during functional activities.   Will update to either a SLS goal or a DGI/FGA goal  next session.   Baseline 07/17/2020- BERG = 49/56; 08/29/20: 50/56 (will convert to new balance goal, as pt cannot improve any more (statistically) due to ceiling effect and medical restrictions)    Time 12    Period Weeks    Status Revised      PT LONG TERM GOAL #5   Title Patient will increase 10 meter walk test to >1.29m/s as to improve gait speed for better community ambulation and to reduce fall risk.    Baseline 07/17/2020= 10MWT avg=11.0 sec (0.91 m/s) without an AD; 08/29/20: 1.58m/s self selected, 1.72m/s;    Time 12    Period Weeks    Status Achieved      Additional Long Term Goals   Additional Long Term Goals Yes      PT LONG TERM GOAL #6   Title Patient will increase Functional Gait Assessment score to >23/30 as to reduce fall risk and improve dynamic gait safety with community ambulation.    Baseline 10/24/2020= FGA score of 20/30    Time 12    Period Weeks    Status New    Target Date 01/16/21                   Plan - 12/11/20 1211     Clinical Impression Statement Patient denied any worsening of low back pain and completed involved tasks well with VC and visual demo. Patient remains motivated to work on core stabilization activities to help manage her chronic low back symptoms. Pt will benefit from further skilled PT to improve BLE strength, balance and functional mobility    Personal Factors and Comorbidities Age;Comorbidity 3+    Comorbidities Osteoporosis, HTN, CHF    Examination-Activity Limitations Bend;Caring for Others;Carry;Lift;Reach Overhead;Sit;Squat;Stairs;Stand    Examination-Participation Restrictions Cleaning;Community Activity;Laundry;Shop;Yard Work    Stability/Clinical  Decision Making Stable/Uncomplicated    Rehab Potential Good    PT Frequency 2x / week    PT Duration 2 weeks    PT Treatment/Interventions ADLs/Self Care Home Management;Cryotherapy;Moist Heat;DME Instruction;Stair training;Functional mobility training;Therapeutic activities;Therapeutic exercise;Balance training;Neuromuscular re-education;Patient/family education;Manual techniques    PT Next Visit Plan Manual therapy/PROM for low back symptoms, balance/core/LE strengthening and training for improved mobility and function    PT Home Exercise Plan No changes    Consulted and Agree with Plan of Care Patient             Patient will benefit from skilled therapeutic intervention in order to improve the following deficits and impairments:  Abnormal gait, Decreased balance, Decreased coordination, Decreased activity tolerance, Cardiopulmonary status limiting activity, Decreased endurance, Decreased mobility, Decreased strength, Difficulty walking, Pain  Visit Diagnosis: Abnormality of gait and mobility  Difficulty in walking, not elsewhere classified  Muscle weakness (generalized)     Problem List Patient Active Problem List   Diagnosis Date Noted   Fall (on) (from) other stairs and steps, initial encounter 08/06/2020   Lumbar facet joint syndrome 06/18/2020   Spondylosis without myelopathy or radiculopathy, lumbosacral region 06/18/2020   DDD (degenerative disc disease), lumbosacral 06/18/2020   COVID 06/04/2020   Chronic pain syndrome 05/16/2020   Pharmacologic therapy 05/16/2020   Disorder of skeletal system 05/16/2020   Problems influencing health status 05/16/2020   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   Chronic hip pain (2ry area of Pain) (Bilateral) (R>L) 05/16/2020   Chronic lower extremity pain (Left) 05/16/2020   Numbness and tingling of both feet (intermittent/recurrent) 05/16/2020   Pain in rib (Bilateral) (R>L) (intermittent) 05/16/2020   Chronic knee  pain  (Left) 05/16/2020   Patellar pain (Left) (chronic, intermittent) 05/16/2020   Hard of hearing 05/16/2020   Carcinoma, lung, right ([place]) 06/25/2019   S/P partial lobectomy of lung 02/25/2019   Mass of upper lobe of right lung 01/06/2019   Chronic nonintractable headache 06/14/2018   Head ache 12/03/2017   Sinus bradycardia 10/06/2017   Chronic low back pain (1ry area of Pain) (Bilateral) (L>R) w/o sciatica 08/17/2017   Panic attack as reaction to stress 08/17/2017   Anxiety 06/15/2017   Atypical chest pain 06/15/2017   Hyperlipidemia 06/15/2017   Cholecystitis with cholelithiasis 02/27/2016   Increased frequency of urination 01/27/2015   History of night sweats 05/01/2014   Dupuytren's contracture 07/18/2013   Inverted nipple 04/18/2013   Depression 04/23/2010   Fibromyalgia 04/23/2010   Hypercholesterolemia 04/23/2010   Essential hypertension 04/23/2010   Hypothyroidism 04/23/2010   Irritable colon 04/23/2010   Osteoarthritis 04/23/2010    Lewis Moccasin, PT 12/11/2020, 2:52 PM  [place] MAIN [facility] SERVICES [address] [place], [place], [address] Phone: [phone]   Fax:  [phone]  Name: Kendra Benton MRN: [id number] Date of Birth: 1940/08/28

## 2020-12-13 ENCOUNTER — Other Ambulatory Visit: Payer: Self-pay

## 2020-12-13 ENCOUNTER — Ambulatory Visit: Payer: Medicare Other | Admitting: Physical Therapy

## 2020-12-13 DIAGNOSIS — R2681 Unsteadiness on feet: Secondary | ICD-10-CM

## 2020-12-13 DIAGNOSIS — R269 Unspecified abnormalities of gait and mobility: Secondary | ICD-10-CM

## 2020-12-13 DIAGNOSIS — M6281 Muscle weakness (generalized): Secondary | ICD-10-CM

## 2020-12-13 DIAGNOSIS — R278 Other lack of coordination: Secondary | ICD-10-CM

## 2020-12-13 DIAGNOSIS — R262 Difficulty in walking, not elsewhere classified: Secondary | ICD-10-CM

## 2020-12-13 NOTE — Therapy (Signed)
[place] MAIN [facility] SERVICES [address] [place], [place], [address] Phone: [phone]   Fax:  [phone]  Physical Therapy Treatment  Patient Details  Name: Kendra Benton MRN: [id number] Date of Birth: Oct 01, 1940 Referring Provider (PT): Dr. Sharia Reeve   Encounter Date: 12/13/2020   PT End of Session - 12/13/20 1237     Visit Number 27    Number of Visits 108    Date for PT Re-Evaluation 01/16/21    Authorization Type Traditional Medicare    Authorization Time Period 07/17/2020- 10/09/2020    PT Start Time 1145    PT Stop Time 1230    PT Time Calculation (min) 45 min    Equipment Utilized During Treatment Gait belt    Activity Tolerance Patient tolerated treatment well    Behavior During Therapy WFL for tasks assessed/performed             Past Medical History:  Diagnosis Date   Allergy    Anxiety    GERD (gastroesophageal reflux disease)    Headache    History of kidney stones    Hypertension    Myocardial infarction ([place])    1997   Osteoporosis    Pneumonia     Past Surgical History:  Procedure Laterality Date   ABDOMINAL HYSTERECTOMY     patient has ovaries   APPENDECTOMY     ARTERY BIOPSY Right 12/30/2017   Procedure: BIOPSY TEMPORAL ARTERY;  Surgeon: Katha Cabal, MD;  Location: [facility] ORS;  Service: Vascular;  Laterality: Right;   ARTERY BIOPSY Left 06/18/2018   Procedure: BIOPSY TEMPORAL ARTERY;  Surgeon: Katha Cabal, MD;  Location: [facility] ORS;  Service: Vascular;  Laterality: Left;   BACK SURGERY     CARDIAC CATHETERIZATION     CATARACT EXTRACTION W/ INTRAOCULAR LENS  IMPLANT, BILATERAL Bilateral 2010   CHOLECYSTECTOMY N/A 02/28/2016   Procedure: LAPAROSCOPIC CHOLECYSTECTOMY WITH INTRAOPERATIVE CHOLANGIOGRAM;  Surgeon: Dia Crawford III, MD;  Location: [facility] ORS;  Service: General;  Laterality: N/A;   LUMBAR FUSION  11/09/2016   L3-L5   SPINE SURGERY     Disc   TONSILLECTOMY      There were no  vitals filed for this visit.   Subjective Assessment - 12/13/20 1235     Subjective Patient reports increased back pain today in the lumbar region - she states the pain increased after picking up a heavy box of books yesterday. 6/10 pain described as tightness.    Pertinent History Patient reports history of chronic LBP with past lumbar fusion surgery on 11/09/2016 and most recent lumbar facet joint block procedure on 06/19/2020. She reports past medical history of spondylosis, Lung Cancer, HTN. She reports increased difficulty performing ADL's yet still independent but has paid services who assist with cleaning.    Limitations Standing;Lifting;Walking;House hold activities    Currently in Pain? Yes    Pain Score 6     Pain Location Back    Pain Orientation Lower    Pain Descriptors / Indicators Tightness;Aching    Pain Type Chronic pain    Pain Onset More than a month ago             INTERVENTIONS  Manual Therapy PROM to lumbar/hips: Double knee to chest, Lower trunk rotation, hamstrings, Piriformis, supine leg crossover x 45 sec hold BLE x 2 sets each.   TherEx Bridging with LE on threraball x 6 reps - pt reports irritation to low back Sidelye Hip ER with  GTB and TA contraction x 10 reps B LE. Childs pose x 1 min  Neuromuscular ReEd Tree pose with alternate toe resting on floor for increased stability, chair in front for occasional UE support 1 x 30s each side Mountain pose with feet shoulder width, added heel lift, UE support om chair, 2 x 30s   Education provided throughout session via VC/TC and demonstration to facilitate movement at target joints and correct muscle activation for all testing and exercises performed. Education and modifications on yoga balance poses pt performs at home.     Clinical Impression: Due to increased pain upon arrival, session focused on stretching and pain relief. Strengthening exercises were attempted with pt reporting increase in pain with  lumbar extension. Pt brought a book of yoga poses to session today to review balance exercises she is performing at home. PT provided modifications and progressions to provide pt with safe yet challenging balance activities at home. Pt was also advised to perform these exercises with a stable surface in front of her in case UE support is required. She did perform each variation in the clinic with UE support on chair as would be practiced at home. Patient remains motivated to increase core stabilization to help manage her chronic low back symptoms. Pt will benefit from further skilled PT to improve BLE strength, balance and functional mobility.         PT Short Term Goals - 10/24/20 1456       PT SHORT TERM GOAL #1   Title Patient will be independent in home exercise program to improve pain relief/ strength/mobility for better functional independence with ADLs.    Baseline 07/17/2020- Patient doesn't have any formal HEP in place. 10/24/2020- Patient reports compliance with current HEP for Low back stretching, core and LE strengthening.    Time 6    Period Weeks    Status Achieved    Target Date 08/28/20               PT Long Term Goals - 10/24/20 1501       PT LONG TERM GOAL #1   Title Patient will increase FOTO score to equal to or greater than 63 to demonstrate statistically significant improvement in mobility and quality of life.    Baseline 07/17/2020: FOTO score=60; 10/24/2020= 40    Time 12    Period Weeks    Status On-going    Target Date 01/16/21      PT LONG TERM GOAL #2   Title Pt will decrease worst back pain as reported on NPRS by at least 2 points in order to demonstrate clinically significant reduction in back pain.    Baseline 07/17/2020- 5/10 LBP at worst; 08/29/20: reaches around a 7/10 almost daily in the past week. 10/24/2020= Patient rates at a 3/10 current and up to 6/10 at worst.    Time 12    Period Weeks    Status On-going    Target Date 01/16/21       PT LONG TERM GOAL #3   Title Patient (> 48 years old) will complete five times sit to stand test in <13 seconds indicating an increased LE strength and improved balance.   Previously was 18sec goal   Baseline 07/17/2020- 26.02 sec; 08/29/20: 14.34sec  10/24/2020 = 22.45 sec    Time 12    Period Weeks    Status On-going      PT LONG TERM GOAL #4   Title Patient will increase Berg Balance score  by > 4 points to demonstrate decreased fall risk during functional activities.   Will update to either a SLS goal or a DGI/FGA goal next session.   Baseline 07/17/2020- BERG = 49/56; 08/29/20: 50/56 (will convert to new balance goal, as pt cannot improve any more (statistically) due to ceiling effect and medical restrictions)    Time 12    Period Weeks    Status Revised      PT LONG TERM GOAL #5   Title Patient will increase 10 meter walk test to >1.19m/s as to improve gait speed for better community ambulation and to reduce fall risk.    Baseline 07/17/2020= 10MWT avg=11.0 sec (0.91 m/s) without an AD; 08/29/20: 1.23m/s self selected, 1.46m/s;    Time 12    Period Weeks    Status Achieved      Additional Long Term Goals   Additional Long Term Goals Yes      PT LONG TERM GOAL #6   Title Patient will increase Functional Gait Assessment score to >23/30 as to reduce fall risk and improve dynamic gait safety with community ambulation.    Baseline 10/24/2020= FGA score of 20/30    Time 12    Period Weeks    Status New    Target Date 01/16/21                   Plan - 12/13/20 1759     Clinical Impression Statement Due to increased pain upon arrival, session focused on stretching and pain relief. Strengthening exercises were attempted with pt reporting increase in pain with lumbar extension. Pt brought a book of yoga poses to session today to review balance exercises she is performing at home. PT provided modifications and progressions to provide pt with safe yet challenging balance activities at  home. Pt was also advised to perform these exercises with a stable surface in front of her in case UE support is required. She did perform each variation in the clinic with UE support on chair as would be practiced at home. Patient remains motivated to increase core stabilization to help manage her chronic low back symptoms. Pt will benefit from further skilled PT to improve BLE strength, balance and functional mobility.    Personal Factors and Comorbidities Age;Comorbidity 3+    Comorbidities Osteoporosis, HTN, CHF    Examination-Activity Limitations Bend;Caring for Others;Carry;Lift;Reach Overhead;Sit;Squat;Stairs;Stand    Examination-Participation Restrictions Cleaning;Community Activity;Laundry;Shop;Yard Work    Stability/Clinical Decision Making Stable/Uncomplicated    Rehab Potential Good    PT Frequency 2x / week    PT Duration 2 weeks    PT Treatment/Interventions ADLs/Self Care Home Management;Cryotherapy;Moist Heat;DME Instruction;Stair training;Functional mobility training;Therapeutic activities;Therapeutic exercise;Balance training;Neuromuscular re-education;Patient/family education;Manual techniques    PT Next Visit Plan Manual therapy/PROM for low back symptoms, balance/core/LE strengthening and training for improved mobility and function    PT Home Exercise Plan No changes    Consulted and Agree with Plan of Care Patient             Patient will benefit from skilled therapeutic intervention in order to improve the following deficits and impairments:  Abnormal gait, Decreased balance, Decreased coordination, Decreased activity tolerance, Cardiopulmonary status limiting activity, Decreased endurance, Decreased mobility, Decreased strength, Difficulty walking, Pain  Visit Diagnosis: Abnormality of gait and mobility  Difficulty in walking, not elsewhere classified  Muscle weakness (generalized)  Other lack of coordination  Unsteadiness on feet     Problem List Patient  Active Problem List   Diagnosis Date  Noted   Fall (on) (from) other stairs and steps, initial encounter 08/06/2020   Lumbar facet joint syndrome 06/18/2020   Spondylosis without myelopathy or radiculopathy, lumbosacral region 06/18/2020   DDD (degenerative disc disease), lumbosacral 06/18/2020   COVID 06/04/2020   Chronic pain syndrome 05/16/2020   Pharmacologic therapy 05/16/2020   Disorder of skeletal system 05/16/2020   Problems influencing health status 05/16/2020   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   Chronic hip pain (2ry area of Pain) (Bilateral) (R>L) 05/16/2020   Chronic lower extremity pain (Left) 05/16/2020   Numbness and tingling of both feet (intermittent/recurrent) 05/16/2020   Pain in rib (Bilateral) (R>L) (intermittent) 05/16/2020   Chronic knee pain (Left) 05/16/2020   Patellar pain (Left) (chronic, intermittent) 05/16/2020   Hard of hearing 05/16/2020   Carcinoma, lung, right ([place]) 06/25/2019   S/P partial lobectomy of lung 02/25/2019   Mass of upper lobe of right lung 01/06/2019   Chronic nonintractable headache 06/14/2018   Head ache 12/03/2017   Sinus bradycardia 10/06/2017   Chronic low back pain (1ry area of Pain) (Bilateral) (L>R) w/o sciatica 08/17/2017   Panic attack as reaction to stress 08/17/2017   Anxiety 06/15/2017   Atypical chest pain 06/15/2017   Hyperlipidemia 06/15/2017   Cholecystitis with cholelithiasis 02/27/2016   Increased frequency of urination 01/27/2015   History of night sweats 05/01/2014   Dupuytren's contracture 07/18/2013   Inverted nipple 04/18/2013   Depression 04/23/2010   Fibromyalgia 04/23/2010   Hypercholesterolemia 04/23/2010   Essential hypertension 04/23/2010   Hypothyroidism 04/23/2010   Irritable colon 04/23/2010   Osteoarthritis 04/23/2010   Patrina Levering PT, DPT  [facility] 12/13/2020, 6:07 PM  [place] MAIN [facility] SERVICES [address] [place], [place],  [address] Phone: [phone]   Fax:  [phone]  Name: CINDRA AUSTAD MRN: [id number] Date of Birth: [birth year]

## 2020-12-18 ENCOUNTER — Other Ambulatory Visit: Payer: Self-pay

## 2020-12-18 ENCOUNTER — Ambulatory Visit: Payer: Medicare Other | Admitting: Physical Therapy

## 2020-12-18 DIAGNOSIS — R269 Unspecified abnormalities of gait and mobility: Secondary | ICD-10-CM | POA: Diagnosis not present

## 2020-12-18 DIAGNOSIS — R2681 Unsteadiness on feet: Secondary | ICD-10-CM

## 2020-12-18 DIAGNOSIS — M6281 Muscle weakness (generalized): Secondary | ICD-10-CM

## 2020-12-18 DIAGNOSIS — R262 Difficulty in walking, not elsewhere classified: Secondary | ICD-10-CM

## 2020-12-18 NOTE — Therapy (Signed)
[place] MAIN [facility] SERVICES [address] [place], [place], [address] Phone: [phone]   Fax:  [phone]  Physical Therapy Treatment  Patient Details  Name: Kendra Benton MRN: [id number] Date of Birth: [birth year] Referring Provider (PT): Dr. Sharia Reeve   Encounter Date: 12/18/2020   PT End of Session - 12/18/20 1243     Visit Number 28    Number of Visits 50    Date for PT Re-Evaluation 01/16/21    Authorization Type Traditional Medicare    Authorization Time Period 07/17/2020- 10/09/2020    PT Start Time 1145    PT Stop Time 1230    PT Time Calculation (min) 45 min    Equipment Utilized During Treatment Gait belt    Activity Tolerance Patient tolerated treatment well    Behavior During Therapy WFL for tasks assessed/performed             Past Medical History:  Diagnosis Date   Allergy    Anxiety    GERD (gastroesophageal reflux disease)    Headache    History of kidney stones    Hypertension    Myocardial infarction ([place])    1997   Osteoporosis    Pneumonia     Past Surgical History:  Procedure Laterality Date   ABDOMINAL HYSTERECTOMY     patient has ovaries   APPENDECTOMY     ARTERY BIOPSY Right 12/30/2017   Procedure: BIOPSY TEMPORAL ARTERY;  Surgeon: Katha Cabal, MD;  Location: [facility] ORS;  Service: Vascular;  Laterality: Right;   ARTERY BIOPSY Left 06/18/2018   Procedure: BIOPSY TEMPORAL ARTERY;  Surgeon: Katha Cabal, MD;  Location: [facility] ORS;  Service: Vascular;  Laterality: Left;   BACK SURGERY     CARDIAC CATHETERIZATION     CATARACT EXTRACTION W/ INTRAOCULAR LENS  IMPLANT, BILATERAL Bilateral 2010   CHOLECYSTECTOMY N/A 02/28/2016   Procedure: LAPAROSCOPIC CHOLECYSTECTOMY WITH INTRAOPERATIVE CHOLANGIOGRAM;  Surgeon: Dia Crawford III, MD;  Location: [facility] ORS;  Service: General;  Laterality: N/A;   LUMBAR FUSION  11/09/2016   L3-L5   SPINE SURGERY     Disc   TONSILLECTOMY      There were no  vitals filed for this visit.   Subjective Assessment - 12/18/20 1206     Subjective Patient reports back pain has improved today compred to last session. 4/10 pain described as tightness in the low back, lef tmore than right. She states she was able to drive to [facility] and spend a day with friends however was sore and spent the next day in bed to recover.    Pertinent History Patient reports history of chronic LBP with past lumbar fusion surgery on 11/09/2016 and most recent lumbar facet joint block procedure on 06/19/2020. She reports past medical history of spondylosis, Lung Cancer, HTN. She reports increased difficulty performing ADL's yet still independent but has paid services who assist with cleaning.    Limitations Standing;Lifting;Walking;House hold activities    Currently in Pain? Yes    Pain Score 4     Pain Location Back    Pain Orientation Lower    Pain Descriptors / Indicators Tightness;Aching    Pain Onset More than a month ago             INTERVENTIONS   Manual Therapy PROM to lumbar/hips: single knee to chest, Lower trunk rotation, hamstrings, Piriformis, supine leg crossover x 45 sec hold BLE   TherEx Bridge with 3 sec hold and TA  activation x 10 reps Bridge with hip abduction using GTB x 10 reps Sid[place]e Hip abduction with GTB and TA contraction x 10 reps B LE. Supine Hip abduction with GTB and TA contraction x 10 reps BLE.   Quadraped position: Ardine Eng pose x 1 min   Neuromuscular ReEd Tree pose with opposite toe on ground for increased stability 4 x 5-20 sec each side; Heel raise with 5 sec hold x 15 (last 5 with arms overhead for "mountain pose"); SLS "crane pose" 2 x 10-20 seconds each side;  Education provided throughout session via VC/TC and demonstration to facilitate movement at target joints and correct muscle activation for all testing and exercises performed.      Clinical Impression: Patient presents to PT with excellent motivation. She was  able to return to extension exercises including bridges. A progression to bridging was included this session. Balance exercises were related to yoga for pt motivation. Patient remains motivated to work on core stabilization activities to help manage her chronic low back symptoms. Pt will benefit from further skilled PT to improve BLE strength, balance and functional mobility        PT Short Term Goals - 10/24/20 1456       PT SHORT TERM GOAL #1   Title Patient will be independent in home exercise program to improve pain relief/ strength/mobility for better functional independence with ADLs.    Baseline 07/17/2020- Patient doesn't have any formal HEP in place. 10/24/2020- Patient reports compliance with current HEP for Low back stretching, core and LE strengthening.    Time 6    Period Weeks    Status Achieved    Target Date 08/28/20               PT Long Term Goals - 10/24/20 1501       PT LONG TERM GOAL #1   Title Patient will increase FOTO score to equal to or greater than 63 to demonstrate statistically significant improvement in mobility and quality of life.    Baseline 07/17/2020: FOTO score=60; 10/24/2020= 40    Time 12    Period Weeks    Status On-going    Target Date 01/16/21      PT LONG TERM GOAL #2   Title Pt will decrease worst back pain as reported on NPRS by at least 2 points in order to demonstrate clinically significant reduction in back pain.    Baseline 07/17/2020- 5/10 LBP at worst; 08/29/20: reaches around a 7/10 almost daily in the past week. 10/24/2020= Patient rates at a 3/10 current and up to 6/10 at worst.    Time 12    Period Weeks    Status On-going    Target Date 01/16/21      PT LONG TERM GOAL #3   Title Patient (> 80 years old) will complete five times sit to stand test in <13 seconds indicating an increased LE strength and improved balance.   Previously was 18sec goal   Baseline 07/17/2020- 26.02 sec; 08/29/20: 14.34sec  10/24/2020 = 22.45 sec     Time 12    Period Weeks    Status On-going      PT LONG TERM GOAL #4   Title Patient will increase Berg Balance score by > 4 points to demonstrate decreased fall risk during functional activities.   Will update to either a SLS goal or a DGI/FGA goal next session.   Baseline 07/17/2020- BERG = 49/56; 08/29/20: 50/56 (will convert to new balance goal,  as pt cannot improve any more (statistically) due to ceiling effect and medical restrictions)    Time 12    Period Weeks    Status Revised      PT LONG TERM GOAL #5   Title Patient will increase 10 meter walk test to >1.49m/s as to improve gait speed for better community ambulation and to reduce fall risk.    Baseline 07/17/2020= 10MWT avg=11.0 sec (0.91 m/s) without an AD; 08/29/20: 1.75m/s self selected, 1.57m/s;    Time 12    Period Weeks    Status Achieved      Additional Long Term Goals   Additional Long Term Goals Yes      PT LONG TERM GOAL #6   Title Patient will increase Functional Gait Assessment score to >23/30 as to reduce fall risk and improve dynamic gait safety with community ambulation.    Baseline 10/24/2020= FGA score of 20/30    Time 12    Period Weeks    Status New    Target Date 01/16/21                   Plan - 12/18/20 1243     Clinical Impression Statement Patient presents to PT with excellent motivation. She was able to return to extension exercises including bridges. A progression to bridging was included this session. Balance exercises were related to yoga for pt motivation. Patient remains motivated to work on core stabilization activities to help manage her chronic low back symptoms. Pt will benefit from further skilled PT to improve BLE strength, balance and functional mobility.    Personal Factors and Comorbidities Age;Comorbidity 3+    Comorbidities Osteoporosis, HTN, CHF    Examination-Activity Limitations Bend;Caring for Others;Carry;Lift;Reach Overhead;Sit;Squat;Stairs;Stand     Examination-Participation Restrictions Cleaning;Community Activity;Laundry;Shop;Yard Work    Stability/Clinical Decision Making Stable/Uncomplicated    Rehab Potential Good    PT Frequency 2x / week    PT Duration 2 weeks    PT Treatment/Interventions ADLs/Self Care Home Management;Cryotherapy;Moist Heat;DME Instruction;Stair training;Functional mobility training;Therapeutic activities;Therapeutic exercise;Balance training;Neuromuscular re-education;Patient/family education;Manual techniques    PT Next Visit Plan Manual therapy/PROM for low back symptoms, balance/core/LE strengthening and training for improved mobility and function    PT Home Exercise Plan No changes    Consulted and Agree with Plan of Care Patient             Patient will benefit from skilled therapeutic intervention in order to improve the following deficits and impairments:  Abnormal gait, Decreased balance, Decreased coordination, Decreased activity tolerance, Cardiopulmonary status limiting activity, Decreased endurance, Decreased mobility, Decreased strength, Difficulty walking, Pain  Visit Diagnosis: Abnormality of gait and mobility  Difficulty in walking, not elsewhere classified  Muscle weakness (generalized)  Unsteadiness on feet     Problem List Patient Active Problem List   Diagnosis Date Noted   Fall (on) (from) other stairs and steps, initial encounter 08/06/2020   Lumbar facet joint syndrome 06/18/2020   Spondylosis without myelopathy or radiculopathy, lumbosacral region 06/18/2020   DDD (degenerative disc disease), lumbosacral 06/18/2020   COVID 06/04/2020   Chronic pain syndrome 05/16/2020   Pharmacologic therapy 05/16/2020   Disorder of skeletal system 05/16/2020   Problems influencing health status 05/16/2020   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   Chronic hip pain (2ry area of Pain) (Bilateral) (R>L) 05/16/2020   Chronic lower extremity pain (Left) 05/16/2020   Numbness and  tingling of both feet (intermittent/recurrent) 05/16/2020   Pain in rib (Bilateral) (R>L) (intermittent) 05/16/2020  Chronic knee pain (Left) 05/16/2020   Patellar pain (Left) (chronic, intermittent) 05/16/2020   Hard of hearing 05/16/2020   Carcinoma, lung, right ([place]) 06/25/2019   S/P partial lobectomy of lung 02/25/2019   Mass of upper lobe of right lung 01/06/2019   Chronic nonintractable headache 06/14/2018   Head ache 12/03/2017   Sinus bradycardia 10/06/2017   Chronic low back pain (1ry area of Pain) (Bilateral) (L>R) w/o sciatica 08/17/2017   Panic attack as reaction to stress 08/17/2017   Anxiety 06/15/2017   Atypical chest pain 06/15/2017   Hyperlipidemia 06/15/2017   Cholecystitis with cholelithiasis 02/27/2016   Increased frequency of urination 01/27/2015   History of night sweats 05/01/2014   Dupuytren's contracture 07/18/2013   Inverted nipple 04/18/2013   Depression 04/23/2010   Fibromyalgia 04/23/2010   Hypercholesterolemia 04/23/2010   Essential hypertension 04/23/2010   Hypothyroidism 04/23/2010   Irritable colon 04/23/2010   Osteoarthritis 04/23/2010   Patrina Levering PT, DPT  [facility] 12/18/2020, 12:45 PM  [place] MAIN [facility] SERVICES [address] [place], [place], [address] Phone: [phone]   Fax:  [phone]  Name: FLORAINE BUECHLER MRN: [id number] Date of Birth: [birth year]

## 2020-12-26 ENCOUNTER — Ambulatory Visit: Payer: Medicare Other

## 2020-12-26 ENCOUNTER — Other Ambulatory Visit: Payer: Self-pay

## 2020-12-26 DIAGNOSIS — R269 Unspecified abnormalities of gait and mobility: Secondary | ICD-10-CM

## 2020-12-26 DIAGNOSIS — M6281 Muscle weakness (generalized): Secondary | ICD-10-CM

## 2020-12-26 DIAGNOSIS — R262 Difficulty in walking, not elsewhere classified: Secondary | ICD-10-CM

## 2020-12-26 NOTE — Therapy (Signed)
[place] MAIN [facility] SERVICES [address] [place], [place], [address] Phone: [phone]   Fax:  [phone]  Physical Therapy Treatment  Patient Details  Name: Kendra Benton MRN: [id number] Date of Birth: 1940-06-13 Referring Provider (PT): Dr. Sharia Reeve   Encounter Date: 12/26/2020   PT End of Session - 12/26/20 1724     Visit Number 29    Number of Visits 50    Date for PT Re-Evaluation 01/16/21    Authorization Type Traditional Medicare    Authorization Time Period 07/17/2020- 10/09/2020    PT Start Time 1605    PT Stop Time 1645    PT Time Calculation (min) 40 min    Equipment Utilized During Treatment Gait belt    Activity Tolerance Patient tolerated treatment well;No increased pain    Behavior During Therapy WFL for tasks assessed/performed             Past Medical History:  Diagnosis Date   Allergy    Anxiety    GERD (gastroesophageal reflux disease)    Headache    History of kidney stones    Hypertension    Myocardial infarction [facility])    1997   Osteoporosis    Pneumonia     Past Surgical History:  Procedure Laterality Date   ABDOMINAL HYSTERECTOMY     patient has ovaries   APPENDECTOMY     ARTERY BIOPSY Right 12/30/2017   Procedure: BIOPSY TEMPORAL ARTERY;  Surgeon: Katha Cabal, MD;  Location: [facility] ORS;  Service: Vascular;  Laterality: Right;   ARTERY BIOPSY Left 06/18/2018   Procedure: BIOPSY TEMPORAL ARTERY;  Surgeon: Katha Cabal, MD;  Location: [facility] ORS;  Service: Vascular;  Laterality: Left;   BACK SURGERY     CARDIAC CATHETERIZATION     CATARACT EXTRACTION W/ INTRAOCULAR LENS  IMPLANT, BILATERAL Bilateral 2010   CHOLECYSTECTOMY N/A 02/28/2016   Procedure: LAPAROSCOPIC CHOLECYSTECTOMY WITH INTRAOPERATIVE CHOLANGIOGRAM;  Surgeon: Dia Crawford III, MD;  Location: [facility] ORS;  Service: General;  Laterality: N/A;   LUMBAR FUSION  11/09/2016   L3-L5   SPINE SURGERY     Disc   TONSILLECTOMY       There were no vitals filed for this visit.   Subjective Assessment - 12/26/20 1721     Subjective Patient reports increased left sided low back pain - rates at a 6/10 and reports she has been dealing with an URI for past several days. States she is leaving for the beach with her daughters with will be gone next week.    Pertinent History Patient reports history of chronic LBP with past lumbar fusion surgery on 11/09/2016 and most recent lumbar facet joint block procedure on 06/19/2020. She reports past medical history of spondylosis, Lung Cancer, HTN. She reports increased difficulty performing ADL's yet still independent but has paid services who assist with cleaning.    Limitations Standing;Lifting;Walking;House hold activities    Currently in Pain? Yes    Pain Score 6     Pain Location Back    Pain Orientation Left;Posterior;Lower    Pain Descriptors / Indicators Aching;Sore;Tightness    Pain Type Chronic pain    Pain Onset More than a month ago    Pain Frequency Constant    Aggravating Factors  Prolonged standing/walking, Lifting, carrying items    Pain Relieving Factors Rest, Meds    Effect of Pain on Daily Activities Difficulty with mobility and ADLs  INTERVENTIONS   Manual Therapy PROM to lumbar/hips: single knee to chest, Lower trunk rotation, hamstrings, Piriformis, supine leg crossover 3 sets x 30 sec hold BLE Prone: STM to left sided paraspinals and gentle distraction to left LE  x 10 sec hold x 5 trials. PIVM at Lumbar - grade 2 x 30 bouts.    TherEx:  Patient performed knee to chest; Side bend to left x 10 reps x 2 sets in standing; Lower trunk rotation   Education provided throughout session via VC/TC and demonstration to facilitate movement at target joints and correct muscle activation for all testing and exercises performed.      Clinical Impression:  Treatment limited to Manual therapy stretching and minimal therapeutic exercises due to patient  report of increased overall pain today. Patient responded well - able to tolerate passive stretching and reported slight decrease in pain in back pain down to 5/10 after treatment. Pt will benefit from further skilled PT to improve BLE strength, balance and functional mobility                            PT Education - 12/26/20 1724     Education Details Exercise technique    Person(s) Educated Patient    Methods Explanation;Demonstration;Tactile cues;Verbal cues    Comprehension Verbalized understanding;Returned demonstration;Verbal cues required;Tactile cues required;Need further instruction              PT Short Term Goals - 10/24/20 1456       PT SHORT TERM GOAL #1   Title Patient will be independent in home exercise program to improve pain relief/ strength/mobility for better functional independence with ADLs.    Baseline 07/17/2020- Patient doesn't have any formal HEP in place. 10/24/2020- Patient reports compliance with current HEP for Low back stretching, core and LE strengthening.    Time 6    Period Weeks    Status Achieved    Target Date 08/28/20               PT Long Term Goals - 10/24/20 1501       PT LONG TERM GOAL #1   Title Patient will increase FOTO score to equal to or greater than 63 to demonstrate statistically significant improvement in mobility and quality of life.    Baseline 07/17/2020: FOTO score=60; 10/24/2020= 40    Time 12    Period Weeks    Status On-going    Target Date 01/16/21      PT LONG TERM GOAL #2   Title Pt will decrease worst back pain as reported on NPRS by at least 2 points in order to demonstrate clinically significant reduction in back pain.    Baseline 07/17/2020- 5/10 LBP at worst; 08/29/20: reaches around a 7/10 almost daily in the past week. 10/24/2020= Patient rates at a 3/10 current and up to 6/10 at worst.    Time 12    Period Weeks    Status On-going    Target Date 01/16/21      PT LONG TERM  GOAL #3   Title Patient (> 72 years old) will complete five times sit to stand test in <13 seconds indicating an increased LE strength and improved balance.   Previously was 18sec goal   Baseline 07/17/2020- 26.02 sec; 08/29/20: 14.34sec  10/24/2020 = 22.45 sec    Time 12    Period Weeks    Status On-going      PT LONG  TERM GOAL #4   Title Patient will increase Berg Balance score by > 4 points to demonstrate decreased fall risk during functional activities.   Will update to either a SLS goal or a DGI/FGA goal next session.   Baseline 07/17/2020- BERG = 49/56; 08/29/20: 50/56 (will convert to new balance goal, as pt cannot improve any more (statistically) due to ceiling effect and medical restrictions)    Time 12    Period Weeks    Status Revised      PT LONG TERM GOAL #5   Title Patient will increase 10 meter walk test to >1.58m/s as to improve gait speed for better community ambulation and to reduce fall risk.    Baseline 07/17/2020= 10MWT avg=11.0 sec (0.91 m/s) without an AD; 08/29/20: 1.90m/s self selected, 1.53m/s;    Time 12    Period Weeks    Status Achieved      Additional Long Term Goals   Additional Long Term Goals Yes      PT LONG TERM GOAL #6   Title Patient will increase Functional Gait Assessment score to >23/30 as to reduce fall risk and improve dynamic gait safety with community ambulation.    Baseline 10/24/2020= FGA score of 20/30    Time 12    Period Weeks    Status New    Target Date 01/16/21                   Plan - 12/26/20 1725     Clinical Impression Statement Treatment limited to Manual therapy stretching and minimal therapeutic exercises due to patient report of increased overall pain today. Patient responded well - able to tolerate passive stretching and reported slight decrease in pain in back pain down to 5/10 after treatment. Pt will benefit from further skilled PT to improve BLE strength, balance and functional mobility    Personal Factors and  Comorbidities Age;Comorbidity 3+    Comorbidities Osteoporosis, HTN, CHF    Examination-Activity Limitations Bend;Caring for Others;Carry;Lift;Reach Overhead;Sit;Squat;Stairs;Stand    Examination-Participation Restrictions Cleaning;Community Activity;Laundry;Shop;Yard Work    Stability/Clinical Decision Making Stable/Uncomplicated    Rehab Potential Good    PT Frequency 2x / week    PT Duration 2 weeks    PT Treatment/Interventions ADLs/Self Care Home Management;Cryotherapy;Moist Heat;DME Instruction;Stair training;Functional mobility training;Therapeutic activities;Therapeutic exercise;Balance training;Neuromuscular re-education;Patient/family education;Manual techniques    PT Next Visit Plan Manual therapy/PROM for low back symptoms, balance/core/LE strengthening and training for improved mobility and function    PT Home Exercise Plan No changes    Consulted and Agree with Plan of Care Patient             Patient will benefit from skilled therapeutic intervention in order to improve the following deficits and impairments:  Abnormal gait, Decreased balance, Decreased coordination, Decreased activity tolerance, Cardiopulmonary status limiting activity, Decreased endurance, Decreased mobility, Decreased strength, Difficulty walking, Pain  Visit Diagnosis: Abnormality of gait and mobility  Difficulty in walking, not elsewhere classified  Muscle weakness (generalized)     Problem List Patient Active Problem List   Diagnosis Date Noted   Fall (on) (from) other stairs and steps, initial encounter 08/06/2020   Lumbar facet joint syndrome 06/18/2020   Spondylosis without myelopathy or radiculopathy, lumbosacral region 06/18/2020   DDD (degenerative disc disease), lumbosacral 06/18/2020   COVID 06/04/2020   Chronic pain syndrome 05/16/2020   Pharmacologic therapy 05/16/2020   Disorder of skeletal system 05/16/2020   Problems influencing health status 05/16/2020   Failed back  surgical  syndrome 05/16/2020   Chronic hip pain (2ry area of Pain) (Bilateral) (R>L) 05/16/2020   Chronic lower extremity pain (Left) 05/16/2020   Numbness and tingling of both feet (intermittent/recurrent) 05/16/2020   Pain in rib (Bilateral) (R>L) (intermittent) 05/16/2020   Chronic knee pain (Left) 05/16/2020   Patellar pain (Left) (chronic, intermittent) 05/16/2020   Hard of hearing 05/16/2020   Carcinoma, lung, right ([place]) 06/25/2019   S/P partial lobectomy of lung 02/25/2019   Mass of upper lobe of right lung 01/06/2019   Chronic nonintractable headache 06/14/2018   Head ache 12/03/2017   Sinus bradycardia 10/06/2017   Chronic low back pain (1ry area of Pain) (Bilateral) (L>R) w/o sciatica 08/17/2017   Panic attack as reaction to stress 08/17/2017   Anxiety 06/15/2017   Atypical chest pain 06/15/2017   Hyperlipidemia 06/15/2017   Cholecystitis with cholelithiasis 02/27/2016   Increased frequency of urination 01/27/2015   History of night sweats 05/01/2014   Dupuytren's contracture 07/18/2013   Inverted nipple 04/18/2013   Depression 04/23/2010   Fibromyalgia 04/23/2010   Hypercholesterolemia 04/23/2010   Essential hypertension 04/23/2010   Hypothyroidism 04/23/2010   Irritable colon 04/23/2010   Osteoarthritis 04/23/2010    Lewis Moccasin, PT 12/26/2020, 5:34 PM  [place] MAIN [facility] SERVICES [address] [place], [place], [address] Phone: [phone]   Fax:  [phone]  Name: FRANCEEN ERISMAN MRN: [id number] Date of Birth: 1941/01/19

## 2021-01-02 ENCOUNTER — Ambulatory Visit: Payer: Medicare Other

## 2021-01-09 ENCOUNTER — Other Ambulatory Visit: Payer: Self-pay

## 2021-01-09 ENCOUNTER — Ambulatory Visit: Payer: Medicare Other | Attending: Internal Medicine

## 2021-01-09 DIAGNOSIS — R278 Other lack of coordination: Secondary | ICD-10-CM | POA: Insufficient documentation

## 2021-01-09 DIAGNOSIS — R2689 Other abnormalities of gait and mobility: Secondary | ICD-10-CM | POA: Insufficient documentation

## 2021-01-09 DIAGNOSIS — M6281 Muscle weakness (generalized): Secondary | ICD-10-CM

## 2021-01-09 DIAGNOSIS — R2681 Unsteadiness on feet: Secondary | ICD-10-CM | POA: Insufficient documentation

## 2021-01-09 DIAGNOSIS — R262 Difficulty in walking, not elsewhere classified: Secondary | ICD-10-CM | POA: Diagnosis present

## 2021-01-09 DIAGNOSIS — R269 Unspecified abnormalities of gait and mobility: Secondary | ICD-10-CM | POA: Diagnosis not present
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
## 2021-01-11 ENCOUNTER — Other Ambulatory Visit: Payer: Self-pay

## 2021-01-11 ENCOUNTER — Encounter: Payer: Self-pay | Admitting: Emergency Medicine

## 2021-01-11 ENCOUNTER — Emergency Department
Admission: EM | Admit: 2021-01-11 | Discharge: 2021-01-11 | Disposition: A | Discharge status: Home / Self Care | Payer: Medicare Other | Attending: Student in an Organized Health Care Education/Training Program | Admitting: Student in an Organized Health Care Education/Training Program

## 2021-01-11 DIAGNOSIS — R42 Dizziness and giddiness: Secondary | ICD-10-CM | POA: Diagnosis present

## 2021-01-11 DIAGNOSIS — Z5321 Procedure and treatment not carried out due to patient leaving prior to being seen by health care provider: Secondary | ICD-10-CM | POA: Insufficient documentation

## 2021-01-11 LAB — BASIC METABOLIC PANEL
Anion gap: 11 (ref 5–15)
BUN: 12 mg/dL (ref 8–23)
CO2: 27 mmol/L (ref 22–32)
Calcium: 9.1 mg/dL (ref 8.9–10.3)
Chloride: 92 mmol/L — ABNORMAL LOW (ref 98–111)
Creatinine, Ser: 0.73 mg/dL (ref 0.44–1.00)
GFR, Estimated: >60 mL/min (ref >60)
Glucose, Bld: 98 mg/dL (ref 70–99)
Potassium: 2.9 mmol/L — ABNORMAL LOW (ref 3.5–5.1)
Sodium: 130 mmol/L — ABNORMAL LOW (ref 135–145)

## 2021-01-11 LAB — CBC
HCT: 34.4 % — ABNORMAL LOW (ref 36.0–46.0)
Hemoglobin: 11.2 g/dL — ABNORMAL LOW (ref 12.0–15.0)
MCH: 29.5 pg (ref 26.0–34.0)
MCHC: 32.6 g/dL (ref 30.0–36.0)
MCV: 90.5 fL (ref 80.0–100.0)
Platelets: 475 10*3/uL — ABNORMAL HIGH (ref 150–400)
RBC: 3.8 MIL/uL — ABNORMAL LOW (ref 3.87–5.11)
RDW: 13.4 % (ref 11.5–15.5)
WBC: 7.6 10*3/uL (ref 4.0–10.5)
nRBC: 0 % (ref 0.0–0.2)

## 2021-01-11 NOTE — ED Triage Notes (Signed)
First Nurse Note:  Arrives via [facility].  Per report, patient [place] the [facility] [place] and felt dizzy.  VS wnl.  Patient states all symptoms resolved.

## 2021-01-11 NOTE — ED Triage Notes (Signed)
Pt reports that she was shopping at the garden center at [facility] and began to feel dizzy and that she was going to pass out. Pt reports that now that she has cooled off she feels better.

## 2021-01-15 ENCOUNTER — Other Ambulatory Visit: Payer: Self-pay

## 2021-01-15 ENCOUNTER — Ambulatory Visit: Payer: Medicare Other

## 2021-01-15 DIAGNOSIS — R269 Unspecified abnormalities of gait and mobility: Secondary | ICD-10-CM | POA: Diagnosis not present

## 2021-01-15 DIAGNOSIS — M6281 Muscle weakness (generalized): Secondary | ICD-10-CM

## 2021-01-15 DIAGNOSIS — R262 Difficulty in walking, not elsewhere classified: Secondary | ICD-10-CM

## 2021-01-15 DIAGNOSIS — R2681 Unsteadiness on feet: Secondary | ICD-10-CM

## 2021-01-15 DIAGNOSIS — R278 Other lack of coordination: Secondary | ICD-10-CM

## 2021-01-15 NOTE — Therapy (Signed)
[place] MAIN [facility] SERVICES [address] [place], [place], [address] Phone: [phone]   Fax:  [phone]  Physical Therapy Treatment  Patient Details  Name: Kendra Benton MRN: [id number] Date of Birth: 1940-08-13 Referring Provider (PT): Dr. Sharia Reeve   Encounter Date: 01/15/2021   PT End of Session - 01/15/21 1705     Visit Number 31    Number of Visits 50    Date for PT Re-Evaluation 01/16/21    Authorization Type Traditional Medicare    Authorization Time Period 07/17/2020- 10/09/2020; 10/24/2020-01/16/2021; PN on 01/09/2021;    PT Start Time 1617    PT Stop Time 1658    PT Time Calculation (min) 41 min    Equipment Utilized During Treatment Gait belt    Activity Tolerance Patient limited by pain    Behavior During Therapy WFL for tasks assessed/performed             Past Medical History:  Diagnosis Date   Allergy    Anxiety    GERD (gastroesophageal reflux disease)    Headache    History of kidney stones    Hypertension    Myocardial infarction ([place])    1997   Osteoporosis    Pneumonia     Past Surgical History:  Procedure Laterality Date   ABDOMINAL HYSTERECTOMY     patient has ovaries   APPENDECTOMY     ARTERY BIOPSY Right 12/30/2017   Procedure: BIOPSY TEMPORAL ARTERY;  Surgeon: Katha Cabal, MD;  Location: [facility] OR[facility];  Service: Vascular;  Laterality: Right;   ARTERY BIOPSY Left 06/18/2018   Procedure: BIOPSY TEMPORAL ARTERY;  Surgeon: Katha Cabal, MD;  Location: [facility];  Service: Vascular;  Laterality: Left;   BACK SURGERY     CARDIAC CATHETERIZATION     CATARACT EXTRACTION W/ INTRAOCULAR LENS  IMPLANT, BILATERAL Bilateral 2010   CHOLECYSTECTOMY N/A 02/28/2016   Procedure: LAPAROSCOPIC CHOLECYSTECTOMY WITH INTRAOPERATIVE CHOLANGIOGRAM;  Surgeon: Dia Crawford III, MD;  Location: [facility] OR[facility];  Service: General;  Laterality: N/A;   LUMBAR FUSION  11/09/2016   L3-L5   SPINE SURGERY     Disc    TONSILLECTOMY      There were no vitals filed for this visit.   Subjective Assessment - 01/15/21 1700     Subjective Patient reports going to ED last week with episode of "overheating and dizziness." She states the wait was long and she was feeling much better so she checked out. States she has not felt great since then but has an appointment with primary MD tomorrow and going to discuss her condition.    Pertinent History Patient reports history of chronic LBP with past lumbar fusion surgery on 11/09/2016 and most recent lumbar facet joint block procedure on 06/19/2020. She reports past medical history of spondylosis, Lung Cancer, HTN. She reports increased difficulty performing ADL's yet still independent but has paid services who assist with cleaning.    Limitations Standing;Lifting;Walking;House hold activities    Currently in Pain? Yes    Pain Score 4     Pain Location Back    Pain Orientation Lower;Posterior    Pain Descriptors / Indicators Aching    Pain Type Chronic pain    Pain Onset More than a month ago    Pain Frequency Constant    Aggravating Factors  Bending/lifting/carrying    Pain Relieving Factors Rest, Meds    Effect of Pain on Daily Activities Difficulty with ADLs, Walking, housework  BP at rest in sitting= Left UE 154/78 mmHg HR=79 bpm BP standing at 128/60 mmHg HR=75 bpm  Patient was asymptomatic except for stating just feeling slightly dizzy while transferring.   Educated in orthostatic BP and reviewed symptoms- Patient to add to discussion with primary MD tomorrow.    Treatment focused on ROM/Pain management:   Manual Therapy:  Gentle long axis distraction to right LE x 30 sec hold x 10. *Patient reports feeling the stretch in a good way." Manual A/P femoral glides using belt (grade 2-3) x 30 bouts x 3 sets. On last set patient reported being sore but pain ceased after stretching.   Therapeutic Exercises:  PROM including knee to chest,  Piriformis, Hamstring, ankle DF/PF x 30 sec x 4 bilateral LE's  Deferred any progression of core stabilization or balance due to orthostatic BP.  Standing with QL stretch into wall x 30 sec x 3sets.  Clinical Impression: Treatment was limited again due to orthostatic BP but patient did respond favorably to lumbar stretching and manual techniques. Reports feeling better after manual and stretching. Patient to discuss recent ED visit and today's recurring Orthostatic issues. Will plan for recertification next visit. Pt will benefit from further skilled PT to improve BLE strength, balance and functional mobility                          PT Education - 01/15/21 1703     Education Details Pain management; review of orthostatic BP    Person(s) Educated Patient    Methods Explanation;Verbal cues    Comprehension Verbalized understanding;Verbal cues required;Need further instruction              PT Short Term Goals - 10/24/20 1456       PT SHORT TERM GOAL #1   Title Patient will be independent in home exercise program to improve pain relief/ strength/mobility for better functional independence with ADLs.    Baseline 07/17/2020- Patient doesn't have any formal HEP in place. 10/24/2020- Patient reports compliance with current HEP for Low back stretching, core and LE strengthening.    Time 6    Period Weeks    Status Achieved    Target Date 08/28/20               PT Long Term Goals - 01/09/21 1616       PT LONG TERM GOAL #1   Title Patient will increase FOTO score to equal to or greater than 63 to demonstrate statistically significant improvement in mobility and quality of life.    Baseline 07/17/2020: FOTO score=60; 10/24/2020= 40; 01/09/2021= 60    Time 12    Period Weeks    Status On-going    Target Date 01/16/21      PT LONG TERM GOAL #2   Title Pt will decrease worst back pain as reported on NPRS by at least 2 points in order to demonstrate clinically  significant reduction in back pain.    Baseline 07/17/2020- 5/10 LBP at worst; 08/29/20: reaches around a 7/10 almost daily in the past week. 10/24/2020= Patient rates at a 3/10 current and up to 6/10 at worst. 01/09/2021-Patient reports ongoing LBP (right sided today) rates at a 5/10.    Time 12    Period Weeks    Status On-going    Target Date 01/16/21      PT LONG TERM GOAL #3   Title Patient (> 30 years old) will complete five times  sit to stand test in <13 seconds indicating an increased LE strength and improved balance.   Previously was 18sec goal   Baseline 07/17/2020- 26.02 sec; 08/29/20: 14.34sec  10/24/2020 = 22.45 sec; 01/09/2021= 14.84 sec    Time 12    Period Weeks    Status On-going    Target Date 01/09/21      PT LONG TERM GOAL #4   Title Patient will increase Berg Balance score by > 4 points to demonstrate decreased fall risk during functional activities.   Will update to either a SLS goal or a DGI/FGA goal next session.   Baseline 07/17/2020- BERG = 49/56; 08/29/20: 50/56 (will convert to new balance goal, as pt cannot improve any more (statistically) due to ceiling effect and medical restrictions)    Time 12    Period Weeks    Status Revised      PT LONG TERM GOAL #5   Title Patient will increase 10 meter walk test to >1.23m/s as to improve gait speed for better community ambulation and to reduce fall risk.    Baseline 07/17/2020= 10MWT avg=11.0 sec (0.91 m/s) without an AD; 08/29/20: 1.65m/s self selected, 1.82m/s;    Time 12    Period Weeks    Status Achieved      PT LONG TERM GOAL #6   Title Patient will increase Functional Gait Assessment score to >23/30 as to reduce fall risk and improve dynamic gait safety with community ambulation.    Baseline 10/24/2020= FGA score of 20/30; 01/09/2021= 27/30  (will keep goal active to ensure patient remains compliant).    Time 12    Period Weeks    Status On-going    Target Date 01/16/21                   Plan - 01/15/21  1706     Clinical Impression Statement Treatment was limited again due to orthostatic BP but patient did respond favorably to lumbar stretching and manual techniques. Reports feeling better after manual and stretching. Patient to discuss recent ED visit and today's recurring Orthostatic issues. Will plan for recertification next visit. Pt will benefit from further skilled PT to improve BLE strength, balance and functional mobility    Personal Factors and Comorbidities Age;Comorbidity 3+    Comorbidities Osteoporosis, HTN, CHF    Examination-Activity Limitations Bend;Caring for Others;Carry;Lift;Reach Overhead;Sit;Squat;Stairs;Stand    Examination-Participation Restrictions Cleaning;Community Activity;Laundry;Shop;Yard Work    Stability/Clinical Decision Making Stable/Uncomplicated    Rehab Potential Good    PT Frequency 2x / week    PT Duration 2 weeks    PT Treatment/Interventions ADLs/Self Care Home Management;Cryotherapy;Moist Heat;DME Instruction;Stair training;Functional mobility training;Therapeutic activities;Therapeutic exercise;Balance training;Neuromuscular re-education;Patient/family education;Manual techniques    PT Next Visit Plan Manual therapy/PROM for low back symptoms, balance/core/LE strengthening and training for improved mobility and function. Reassessment due next visit    PT Home Exercise Plan No changes except to continue to monitor BP readings    Consulted and Agree with Plan of Care Patient             Patient will benefit from skilled therapeutic intervention in order to improve the following deficits and impairments:  Abnormal gait, Decreased balance, Decreased coordination, Decreased activity tolerance, Cardiopulmonary status limiting activity, Decreased endurance, Decreased mobility, Decreased strength, Difficulty walking, Pain  Visit Diagnosis: Abnormality of gait and mobility  Difficulty in walking, not elsewhere classified  Muscle weakness  (generalized)  Other lack of coordination  Unsteadiness on feet  Problem List Patient Active Problem List   Diagnosis Date Noted   Fall (on) (from) other stairs and steps, initial encounter 08/06/2020   Lumbar facet joint syndrome 06/18/2020   Spondylosis without myelopathy or radiculopathy, lumbosacral region 06/18/2020   DDD (degenerative disc disease), lumbosacral 06/18/2020   COVID 06/04/2020   Chronic pain syndrome 05/16/2020   Pharmacologic therapy 05/16/2020   Disorder of skeletal system 05/16/2020   Problems influencing health status 05/16/2020   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   Chronic hip pain (2ry area of Pain) (Bilateral) (R>L) 05/16/2020   Chronic lower extremity pain (Left) 05/16/2020   Numbness and tingling of both feet (intermittent/recurrent) 05/16/2020   Pain in rib (Bilateral) (R>L) (intermittent) 05/16/2020   Chronic knee pain (Left) 05/16/2020   Patellar pain (Left) (chronic, intermittent) 05/16/2020   Hard of hearing 05/16/2020   Carcinoma, lung, right ([place]) 06/25/2019   S/P partial lobectomy of lung 02/25/2019   Mass of upper lobe of right lung 01/06/2019   Chronic nonintractable headache 06/14/2018   Head ache 12/03/2017   Sinus bradycardia 10/06/2017   Chronic low back pain (1ry area of Pain) (Bilateral) (L>R) w/o sciatica 08/17/2017   Panic attack as reaction to stress 08/17/2017   Anxiety 06/15/2017   Atypical chest pain 06/15/2017   Hyperlipidemia 06/15/2017   Cholecystitis with cholelithiasis 02/27/2016   Increased frequency of urination 01/27/2015   History of night sweats 05/01/2014   Dupuytren's contracture 07/18/2013   Inverted nipple 04/18/2013   Depression 04/23/2010   Fibromyalgia 04/23/2010   Hypercholesterolemia 04/23/2010   Essential hypertension 04/23/2010   Hypothyroidism 04/23/2010   Irritable colon 04/23/2010   Osteoarthritis 04/23/2010    Lewis Moccasin, PT 01/15/2021, 5:23 PM  [place] MAIN [facility] SERVICES [address] [place], [place], [address] Phone: [phone]   Fax:  [phone]  Name: BOBBIEJO ISHIKAWA MRN: [id number] Date of Birth: January 14, 1941

## 2021-01-16 ENCOUNTER — Ambulatory Visit: Payer: Medicare Other

## 2021-01-24 ENCOUNTER — Ambulatory Visit: Payer: Medicare Other

## 2021-01-24 ENCOUNTER — Other Ambulatory Visit: Payer: Self-pay

## 2021-01-24 DIAGNOSIS — R269 Unspecified abnormalities of gait and mobility: Secondary | ICD-10-CM

## 2021-01-24 DIAGNOSIS — M6281 Muscle weakness (generalized): Secondary | ICD-10-CM

## 2021-01-24 DIAGNOSIS — R262 Difficulty in walking, not elsewhere classified: Secondary | ICD-10-CM

## 2021-01-24 DIAGNOSIS — R2681 Unsteadiness on feet: Secondary | ICD-10-CM

## 2021-01-24 DIAGNOSIS — R2689 Other abnormalities of gait and mobility: Secondary | ICD-10-CM

## 2021-01-24 NOTE — Therapy (Signed)
[place] MAIN [facility] SERVICES [address] [place], [place], [address] Phone: [phone]   Fax:  [phone]  Physical Therapy Treatment/Recertification 6/54/6503- 03/21/2021  Patient Details  Name: Kendra Benton MRN: [id number] Date of Birth: [birth year] Referring Provider (PT): Dr. Sharia Reeve   Encounter Date: 01/24/2021   PT End of Session - 01/24/21 0816     Visit Number 32    Number of Visits 50    Date for PT Re-Evaluation 01/16/21    Authorization Type Traditional Medicare    Authorization Time Period 07/17/2020- 10/09/2020; 10/24/2020-01/16/2021; PN on 01/09/2021; 01/24/2021-    PT Start Time 0809    PT Stop Time 5170    PT Time Calculation (min) 35 min    Equipment Utilized During Treatment Gait belt    Activity Tolerance Patient limited by pain    Behavior During Therapy WFL for tasks assessed/performed             Past Medical History:  Diagnosis Date   Allergy    Anxiety    GERD (gastroesophageal reflux disease)    Headache    History of kidney stones    Hypertension    Myocardial infarction ([place])    1997   Osteoporosis    Pneumonia     Past Surgical History:  Procedure Laterality Date   ABDOMINAL HYSTERECTOMY     patient has ovaries   APPENDECTOMY     ARTERY BIOPSY Right 12/30/2017   Procedure: BIOPSY TEMPORAL ARTERY;  Surgeon: Katha Cabal, MD;  Location: [facility] ORS;  Service: Vascular;  Laterality: Right;   ARTERY BIOPSY Left 06/18/2018   Procedure: BIOPSY TEMPORAL ARTERY;  Surgeon: Katha Cabal, MD;  Location: [facility] ORS;  Service: Vascular;  Laterality: Left;   BACK SURGERY     CARDIAC CATHETERIZATION     CATARACT EXTRACTION W/ INTRAOCULAR LENS  IMPLANT, BILATERAL Bilateral 2010   CHOLECYSTECTOMY N/A 02/28/2016   Procedure: LAPAROSCOPIC CHOLECYSTECTOMY WITH INTRAOPERATIVE CHOLANGIOGRAM;  Surgeon: Dia Crawford III, MD;  Location: [facility] ORS;  Service: General;  Laterality: N/A;   LUMBAR FUSION  11/09/2016    L3-L5   SPINE SURGERY     Disc   TONSILLECTOMY      There were no vitals filed for this visit.   Subjective Assessment - 01/24/21 0814     Subjective Patient reports she is doing okay- having about a 4/10 low back pain. She reports she would like be able to go up/down her steps at home better since she just has 1 rail.    Pertinent History Patient reports history of chronic LBP with past lumbar fusion surgery on 11/09/2016 and most recent lumbar facet joint block procedure on 06/19/2020. She reports past medical history of spondylosis, Lung Cancer, HTN. She reports increased difficulty performing ADL's yet still independent but has paid services who assist with cleaning.    Limitations Standing;Lifting;Walking;House hold activities    Currently in Pain? Yes    Pain Score 4     Pain Location Back    Pain Orientation Posterior;Lower    Pain Descriptors / Indicators Aching;Sore    Pain Type Chronic pain    Pain Onset More than a month ago    Pain Frequency Constant    Aggravating Factors  Bending/Lifting/carrying    Pain Relieving Factors Rest, Meds    Effect of Pain on Daily Activities Difficulty with ADLs, Walking, housework              Interventions:  Patient performed self stretching to demo good working knowledge of HEP including Knee to chest, Hamstring, Lower trunk rotation, piriformis.  Added sidelye stretch with 1/2 foal pad to stretch Side/low back x 30 sec x 4 sets  Manual techniques- Gentle long- axis distraction to right LE x 30 sec hold x 5.   Reassessed goals:   FGA: 28/30 improved from 20/30 on 5/18 Low back pain- 4/10 improved from initial 5/10. 5x STS= 15.0 sec without UE support.  Added new step goal Added Body mechanic goal  Clinical Impression: Patient continues to have chronic low back yet has good understanding of stretching and movement to manage her pain and maintain flexibility and function. She has met improved in all goals including reporting  decreased low back pain, Meeting FGA score demo improved balance with no further falls. Add new step goal and body mechanic goal to assist patient in maintaining good functional mobility and independence while reducing risk of falling or injury to spine. Patient's condition has the potential to improve in response to therapy. Maximum improvement is yet to be obtained. The anticipated improvement is attainable and reasonable in a generally predictable time.                      PT Education - 01/24/21 0816     Education Details Exercise technique    Person(s) Educated Patient    Methods Explanation;Demonstration;Tactile cues;Verbal cues    Comprehension Verbalized understanding;Returned demonstration;Tactile cues required;Need further instruction;Verbal cues required              PT Short Term Goals - 10/24/20 1456       PT SHORT TERM GOAL #1   Title Patient will be independent in home exercise program to improve pain relief/ strength/mobility for better functional independence with ADLs.    Baseline 07/17/2020- Patient doesn't have any formal HEP in place. 10/24/2020- Patient reports compliance with current HEP for Low back stretching, core and LE strengthening.    Time 6    Period Weeks    Status Achieved    Target Date 08/28/20               PT Long Term Goals - 01/24/21 0818       PT LONG TERM GOAL #1   Title Patient will increase FOTO score to equal to or greater than 63 to demonstrate statistically significant improvement in mobility and quality of life.    Baseline 07/17/2020: FOTO score=60; 10/24/2020= 40; 01/09/2021= 60. 01/24/2021- Did not update since just administered on 01/09/2021    Time 8    Period Weeks    Status On-going    Target Date 03/21/21      PT LONG TERM GOAL #2   Title Pt will decrease worst back pain as reported on NPRS by at least 2 points in order to demonstrate clinically significant reduction in back pain.    Baseline 07/17/2020-  5/10 LBP at worst; 08/29/20: reaches around a 7/10 almost daily in the past week. 10/24/2020= Patient rates at a 3/10 current and up to 6/10 at worst. 01/09/2021-Patient reports ongoing LBP (right sided today) rates at a 5/10. 01/24/2021= Patient reports LBP around 4/10    Time 8    Period Weeks    Status On-going    Target Date 03/21/21      PT LONG TERM GOAL #3   Title Patient (> 45 years old) will complete five times sit to stand test in <13 seconds indicating  an increased LE strength and improved balance.   Previously was 18sec goal   Baseline 07/17/2020- 26.02 sec; 08/29/20: 14.34sec  10/24/2020 = 22.45 sec; 01/09/2021= 14.84 sec. 01/24/2021=15.0 sec without UE support    Time 12    Period Weeks    Status On-going      PT LONG TERM GOAL #4   Title Patient will increase Berg Balance score by > 4 points to demonstrate decreased fall risk during functional activities.   Will update to either a SLS goal or a DGI/FGA goal next session.   Baseline 07/17/2020- BERG = 49/56; 08/29/20: 50/56 (will convert to new balance goal, as pt cannot improve any more (statistically) due to ceiling effect and medical restrictions)    Time 12    Period Weeks    Status Revised      PT LONG TERM GOAL #5   Title Patient will increase 10 meter walk test to >1.69ms as to improve gait speed for better community ambulation and to reduce fall risk.    Baseline 07/17/2020= 10MWT avg=11.0 sec (0.91 m/s) without an AD; 08/29/20: 1.092m self selected, 1.1977m    Time 12    Period Weeks    Status Achieved      Additional Long Term Goals   Additional Long Term Goals Yes      PT LONG TERM GOAL #6   Title Patient will increase Functional Gait Assessment score to >23/30 as to reduce fall risk and improve dynamic gait safety with community ambulation.    Baseline 10/24/2020= FGA score of 20/30; 01/09/2021= 27/30  (will keep goal active to ensure patient remains compliant). 01/24/2021=28/30    Time 12    Period Weeks    Status  Achieved      PT LONG TERM GOAL #7   Title Patient will ascend/descend 4 steps with 1 rail while holding a bag (ex. grocery) - demo reciprocal steps with mod independence for improved independence and step negotiation    Baseline 01/24/2021- Patient requires step to gait and railing and unable to hold a bag of grocery when stepping up    Time 8    Period Weeks    Status New    Target Date 03/21/21      PT LONG TERM GOAL #8   Title Patient will exhibit proper body mechanics with household activities to reduce risk of injury to spine.    Baseline 01/24/2021: Patient admits to decreased knowledge of safe and proper body mechanics with performing housework.    Time 8    Period Weeks    Status New    Target Date 03/21/21                   Plan - 01/24/21 0817     Clinical Impression Statement Patient continues to have chronic low back yet has good understanding of stretching and movement to manage her pain and maintain flexibility and function. She has met improved in all goals including reporting decreased low back pain, Meeting FGA score demo improved balance with no further falls. Add new step goal and body mechanic goal to assist patient in maintaining good functional mobility and independence while reducing risk of falling or injury to spine. Patient's condition has the potential to improve in response to therapy. Maximum improvement is yet to be obtained. The anticipated improvement is attainable and reasonable in a generally predictable time.    Personal Factors and Comorbidities Age;Comorbidity 3+    Comorbidities Osteoporosis, HTN, CHF  Examination-Activity Limitations Bend;Caring for Others;Carry;Lift;Reach Overhead;Sit;Squat;Stairs;Stand    Examination-Participation Restrictions Cleaning;Community Activity;Laundry;Shop;Yard Work    Stability/Clinical Decision Making Stable/Uncomplicated    Rehab Potential Good    PT Frequency 2x / week    PT Duration 2 weeks    PT  Treatment/Interventions ADLs/Self Care Home Management;Cryotherapy;Moist Heat;DME Instruction;Stair training;Functional mobility training;Therapeutic activities;Therapeutic exercise;Balance training;Neuromuscular re-education;Patient/family education;Manual techniques    PT Next Visit Plan Manual therapy/PROM for low back symptoms, balance/core/LE strengthening and training for improved mobility and function. Reassessment due next visit    PT Home Exercise Plan No changes    Consulted and Agree with Plan of Care Patient             Patient will benefit from skilled therapeutic intervention in order to improve the following deficits and impairments:  Abnormal gait, Decreased balance, Decreased coordination, Decreased activity tolerance, Cardiopulmonary status limiting activity, Decreased endurance, Decreased mobility, Decreased strength, Difficulty walking, Pain  Visit Diagnosis: Abnormality of gait and mobility  Difficulty in walking, not elsewhere classified  Muscle weakness (generalized)  Other abnormalities of gait and mobility  Unsteadiness on feet     Problem List Patient Active Problem List   Diagnosis Date Noted   Fall (on) (from) other stairs and steps, initial encounter 08/06/2020   Lumbar facet joint syndrome 06/18/2020   Spondylosis without myelopathy or radiculopathy, lumbosacral region 06/18/2020   DDD (degenerative disc disease), lumbosacral 06/18/2020   COVID 06/04/2020   Chronic pain syndrome 05/16/2020   Pharmacologic therapy 05/16/2020   Disorder of skeletal system 05/16/2020   Problems influencing health status 05/16/2020   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   Chronic hip pain (2ry area of Pain) (Bilateral) (R>L) 05/16/2020   Chronic lower extremity pain (Left) 05/16/2020   Numbness and tingling of both feet (intermittent/recurrent) 05/16/2020   Pain in rib (Bilateral) (R>L) (intermittent) 05/16/2020   Chronic knee pain (Left) 05/16/2020    Patellar pain (Left) (chronic, intermittent) 05/16/2020   Hard of hearing 05/16/2020   Carcinoma, lung, right ([place]) 06/25/2019   S/P partial lobectomy of lung 02/25/2019   Mass of upper lobe of right lung 01/06/2019   Chronic nonintractable headache 06/14/2018   Head ache 12/03/2017   Sinus bradycardia 10/06/2017   Chronic low back pain (1ry area of Pain) (Bilateral) (L>R) w/o sciatica 08/17/2017   Panic attack as reaction to stress 08/17/2017   Anxiety 06/15/2017   Atypical chest pain 06/15/2017   Hyperlipidemia 06/15/2017   Cholecystitis with cholelithiasis 02/27/2016   Increased frequency of urination 01/27/2015   History of night sweats 05/01/2014   Dupuytren's contracture 07/18/2013   Inverted nipple 04/18/2013   Depression 04/23/2010   Fibromyalgia 04/23/2010   Hypercholesterolemia 04/23/2010   Essential hypertension 04/23/2010   Hypothyroidism 04/23/2010   Irritable colon 04/23/2010   Osteoarthritis 04/23/2010    Lewis Moccasin, PT 01/24/2021, 5:52 PM  [place] MAIN [facility] SERVICES [address] [place], [place], [address] Phone: [phone]   Fax:  [phone]  Name: TAYTEN HEBER MRN: [id number] Date of Birth: Aug 13, 1940

## 2021-01-29 ENCOUNTER — Other Ambulatory Visit: Payer: Self-pay

## 2021-01-29 ENCOUNTER — Ambulatory Visit: Payer: Medicare Other

## 2021-01-29 DIAGNOSIS — R269 Unspecified abnormalities of gait and mobility: Secondary | ICD-10-CM | POA: Diagnosis not present

## 2021-01-29 DIAGNOSIS — M6281 Muscle weakness (generalized): Secondary | ICD-10-CM

## 2021-01-29 DIAGNOSIS — R2681 Unsteadiness on feet: Secondary | ICD-10-CM

## 2021-01-29 DIAGNOSIS — R262 Difficulty in walking, not elsewhere classified: Secondary | ICD-10-CM

## 2021-01-29 NOTE — Therapy (Signed)
[place] [facility] SERVICES [address] [place], [place], [address] Phone: [phone]   Fax:  [phone]  Physical Therapy Treatment  Patient Details  Name: Kendra Benton MRN: [id number] Date of Birth: [birth year] Referring Provider (PT): Dr. Sharia Reeve   Encounter Date: 01/29/2021   PT End of Session - 01/29/21 0831     Visit Number 33    Number of Visits 50    Date for PT Re-Evaluation 03/21/21    Authorization Type Traditional Medicare    Authorization Time Period 07/17/2020- 10/09/2020; 10/24/2020-01/16/2021; PN on 01/14/5026; recert 7/41/2878-67/67/2094    PT Start Time 0829    PT Stop Time 0914    PT Time Calculation (min) 45 min    Equipment Utilized During Treatment Gait belt    Activity Tolerance Patient limited by pain    Behavior During Therapy WFL for tasks assessed/performed             Past Medical History:  Diagnosis Date   Allergy    Anxiety    GERD (gastroesophageal reflux disease)    Headache    History of kidney stones    Hypertension    Myocardial infarction [facility])    1997   Osteoporosis    Pneumonia     Past Surgical History:  Procedure Laterality Date   ABDOMINAL HYSTERECTOMY     patient has ovaries   APPENDECTOMY     ARTERY BIOPSY Right 12/30/2017   Procedure: BIOPSY TEMPORAL ARTERY;  Surgeon: Katha Cabal, MD;  Location: [facility] ORS;  Service: Vascular;  Laterality: Right;   ARTERY BIOPSY Left 06/18/2018   Procedure: BIOPSY TEMPORAL ARTERY;  Surgeon: Katha Cabal, MD;  Location: [facility] ORS;  Service: Vascular;  Laterality: Left;   BACK SURGERY     CARDIAC CATHETERIZATION     CATARACT EXTRACTION W/ INTRAOCULAR LENS  IMPLANT, BILATERAL Bilateral 2010   CHOLECYSTECTOMY N/A 02/28/2016   Procedure: LAPAROSCOPIC CHOLECYSTECTOMY WITH INTRAOPERATIVE CHOLANGIOGRAM;  Surgeon: Dia Crawford III, MD;  Location: [facility] ORS;  Service: General;  Laterality: N/A;   LUMBAR FUSION  11/09/2016   L3-L5   SPINE  SURGERY     Disc   TONSILLECTOMY      There were no vitals filed for this visit.   Subjective Assessment - 01/29/21 0830     Subjective Patient reports she was able to perform approx 45 min of yoga without significant increase in pain. States she is feeling okay- approx 3/10 LBP today.    Pertinent History Patient reports history of chronic LBP with past lumbar fusion surgery on 11/09/2016 and most recent lumbar facet joint block procedure on 06/19/2020. She reports past medical history of spondylosis, Lung Cancer, HTN. She reports increased difficulty performing ADL's yet still independent but has paid services who assist with cleaning.    Limitations Standing;Lifting;Walking;House hold activities    Currently in Pain? Yes    Pain Score 3     Pain Location Back    Pain Orientation Posterior;Lower;Right    Pain Descriptors / Indicators Aching    Pain Type Chronic pain    Pain Onset More than a month ago    Pain Frequency Constant    Aggravating Factors  Bending/lifting/carrying    Pain Relieving Factors Rest, Meds    Effect of Pain on Daily Activities Difficulty with ADLs, walking, housework            Interventions:   Manual therapy: PROM to lumbar/hips: single knee to chest, Lower trunk  rotation, hamstrings, Piriformis, supine leg crossover 3 sets x 30 sec hold BLE  Therapeutic exercises: Patient performed sidelye stretching (with towel roll under lateral hip) prolonged hold x 5 min.   Patient performed active knee to chest and B Lower trunk rotation and piriformis x 5 min total.   Instructed patient in self contract relax technique with B Hip flex in supine- hold 5 sec x 5 sets with isometric hip ext into mat. (Instructed to use yoga block under heel at home) x 5 sets.   Patient reported feeling better after session with no obvious pain.   She performed Steps going up step to technique with use of R rail x 2 trials and one reciprocal steps- No increased reported pain and no  LOB.   Clinical impression: Patient performed  well today - Patient responded well to PROM and contract relax techniques with improved left sided low back symptoms. She demo improved ability to negotiate steps with 1 rail after instruction today as well. Pt will benefit from further skilled PT to improve BLE strength, balance and functional mobility                           PT Education - 01/29/21 0830     Education Details Exercise and step technique    Person(s) Educated Patient    Methods Explanation;Demonstration;Tactile cues;Verbal cues    Comprehension Verbalized understanding;Returned demonstration;Verbal cues required;Tactile cues required;Need further instruction              PT Short Term Goals - 10/24/20 1456       PT SHORT TERM GOAL #1   Title Patient will be independent in home exercise program to improve pain relief/ strength/mobility for better functional independence with ADLs.    Baseline 07/17/2020- Patient doesn't have any formal HEP in place. 10/24/2020- Patient reports compliance with current HEP for Low back stretching, core and LE strengthening.    Time 6    Period Weeks    Status Achieved    Target Date 08/28/20               PT Long Term Goals - 01/24/21 0818       PT LONG TERM GOAL #1   Title Patient will increase FOTO score to equal to or greater than 63 to demonstrate statistically significant improvement in mobility and quality of life.    Baseline 07/17/2020: FOTO score=60; 10/24/2020= 40; 01/09/2021= 60. 01/24/2021- Did not update since just administered on 01/09/2021    Time 8    Period Weeks    Status On-going    Target Date 03/21/21      PT LONG TERM GOAL #2   Title Pt will decrease worst back pain as reported on NPRS by at least 2 points in order to demonstrate clinically significant reduction in back pain.    Baseline 07/17/2020- 5/10 LBP at worst; 08/29/20: reaches around a 7/10 almost daily in the past week.  10/24/2020= Patient rates at a 3/10 current and up to 6/10 at worst. 01/09/2021-Patient reports ongoing LBP (right sided today) rates at a 5/10. 01/24/2021= Patient reports LBP around 4/10    Time 8    Period Weeks    Status On-going    Target Date 03/21/21      PT LONG TERM GOAL #3   Title Patient (> 13 years old) will complete five times sit to stand test in <13 seconds indicating an increased LE strength and  improved balance.   Previously was 18sec goal   Baseline 07/17/2020- 26.02 sec; 08/29/20: 14.34sec  10/24/2020 = 22.45 sec; 01/09/2021= 14.84 sec. 01/24/2021=15.0 sec without UE support    Time 12    Period Weeks    Status On-going      PT LONG TERM GOAL #4   Title Patient will increase Berg Balance score by > 4 points to demonstrate decreased fall risk during functional activities.   Will update to either a SLS goal or a DGI/FGA goal next session.   Baseline 07/17/2020- BERG = 49/56; 08/29/20: 50/56 (will convert to new balance goal, as pt cannot improve any more (statistically) due to ceiling effect and medical restrictions)    Time 12    Period Weeks    Status Revised      PT LONG TERM GOAL #5   Title Patient will increase 10 meter walk test to >1.40m/s as to improve gait speed for better community ambulation and to reduce fall risk.    Baseline 07/17/2020= 10MWT avg=11.0 sec (0.91 m/s) without an AD; 08/29/20: 1.69m/s self selected, 1.80m/s;    Time 12    Period Weeks    Status Achieved      Additional Long Term Goals   Additional Long Term Goals Yes      PT LONG TERM GOAL #6   Title Patient will increase Functional Gait Assessment score to >23/30 as to reduce fall risk and improve dynamic gait safety with community ambulation.    Baseline 10/24/2020= FGA score of 20/30; 01/09/2021= 27/30  (will keep goal active to ensure patient remains compliant). 01/24/2021=28/30    Time 12    Period Weeks    Status Achieved      PT LONG TERM GOAL #7   Title Patient will ascend/descend 4 steps  with 1 rail while holding a bag (ex. grocery) - demo reciprocal steps with mod independence for improved independence and step negotiation    Baseline 01/24/2021- Patient requires step to gait and railing and unable to hold a bag of grocery when stepping up    Time 8    Period Weeks    Status New    Target Date 03/21/21      PT LONG TERM GOAL #8   Title Patient will exhibit proper body mechanics with household activities to reduce risk of injury to spine.    Baseline 01/24/2021: Patient admits to decreased knowledge of safe and proper body mechanics with performing housework.    Time 8    Period Weeks    Status New    Target Date 03/21/21                   Plan - 01/29/21 1419     Clinical Impression Statement Patient performed  well today - Patient responded well to PROM and contract relax techniques with improved left sided low back symptoms. She demo improved ability to negotiate steps with 1 rail after instruction today as well. Pt will benefit from further skilled PT to improve BLE strength, balance and functional mobilit    Personal Factors and Comorbidities Age;Comorbidity 3+    Comorbidities Osteoporosis, HTN, CHF    Examination-Activity Limitations Bend;Caring for Others;Carry;Lift;Reach Overhead;Sit;Squat;Stairs;Stand    Examination-Participation Restrictions Cleaning;Community Activity;Laundry;Shop;Yard Work    Stability/Clinical Decision Making Stable/Uncomplicated    Rehab Potential Good    PT Frequency 2x / week    PT Duration 2 weeks    PT Treatment/Interventions ADLs/Self Care Home Management;Cryotherapy;Moist Heat;DME Instruction;Stair training;Functional mobility  training;Therapeutic activities;Therapeutic exercise;Balance training;Neuromuscular re-education;Patient/family education;Manual techniques    PT Next Visit Plan Manual therapy/PROM for low back symptoms, balance/core/LE strengthening and training for improved mobility and function. Reassessment due next  visit    PT Home Exercise Plan No changes    Consulted and Agree with Plan of Care Patient             Patient will benefit from skilled therapeutic intervention in order to improve the following deficits and impairments:  Abnormal gait, Decreased balance, Decreased coordination, Decreased activity tolerance, Cardiopulmonary status limiting activity, Decreased endurance, Decreased mobility, Decreased strength, Difficulty walking, Pain  Visit Diagnosis: Abnormality of gait and mobility  Difficulty in walking, not elsewhere classified  Muscle weakness (generalized)  Unsteadiness on feet     Problem List Patient Active Problem List   Diagnosis Date Noted   Fall (on) (from) other stairs and steps, initial encounter 08/06/2020   Lumbar facet joint syndrome 06/18/2020   Spondylosis without myelopathy or radiculopathy, lumbosacral region 06/18/2020   DDD (degenerative disc disease), lumbosacral 06/18/2020   COVID 06/04/2020   Chronic pain syndrome 05/16/2020   Pharmacologic therapy 05/16/2020   Disorder of skeletal system 05/16/2020   Problems influencing health status 05/16/2020   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   Chronic hip pain (2ry area of Pain) (Bilateral) (R>L) 05/16/2020   Chronic lower extremity pain (Left) 05/16/2020   Numbness and tingling of both feet (intermittent/recurrent) 05/16/2020   Pain in rib (Bilateral) (R>L) (intermittent) 05/16/2020   Chronic knee pain (Left) 05/16/2020   Patellar pain (Left) (chronic, intermittent) 05/16/2020   Hard of hearing 05/16/2020   Carcinoma, lung, right ([place]) 06/25/2019   S/P partial lobectomy of lung 02/25/2019   Mass of upper lobe of right lung 01/06/2019   Chronic nonintractable headache 06/14/2018   Head ache 12/03/2017   Sinus bradycardia 10/06/2017   Chronic low back pain (1ry area of Pain) (Bilateral) (L>R) w/o sciatica 08/17/2017   Panic attack as reaction to stress 08/17/2017   Anxiety 06/15/2017    Atypical chest pain 06/15/2017   Hyperlipidemia 06/15/2017   Cholecystitis with cholelithiasis 02/27/2016   Increased frequency of urination 01/27/2015   History of night sweats 05/01/2014   Dupuytren's contracture 07/18/2013   Inverted nipple 04/18/2013   Depression 04/23/2010   Fibromyalgia 04/23/2010   Hypercholesterolemia 04/23/2010   Essential hypertension 04/23/2010   Hypothyroidism 04/23/2010   Irritable colon 04/23/2010   Osteoarthritis 04/23/2010    Lewis Moccasin, PT 01/29/2021, 2:29 PM  [place] [facility] SERVICES [address] [place], [place], [address] Phone: [phone]   Fax:  [phone]  Name: ALKA FALWELL MRN: [id number] Date of Birth: June 16, 1940

## 2021-02-04 ENCOUNTER — Ambulatory Visit: Payer: Medicare Other

## 2021-02-05 ENCOUNTER — Other Ambulatory Visit: Payer: Self-pay

## 2021-02-05 ENCOUNTER — Ambulatory Visit: Payer: Medicare Other | Admitting: Physical Therapy

## 2021-02-05 DIAGNOSIS — M6281 Muscle weakness (generalized): Secondary | ICD-10-CM

## 2021-02-05 DIAGNOSIS — R262 Difficulty in walking, not elsewhere classified: Secondary | ICD-10-CM

## 2021-02-05 DIAGNOSIS — R269 Unspecified abnormalities of gait and mobility: Secondary | ICD-10-CM

## 2021-02-05 DIAGNOSIS — R2681 Unsteadiness on feet: Secondary | ICD-10-CM

## 2021-02-05 DIAGNOSIS — R2689 Other abnormalities of gait and mobility: Secondary | ICD-10-CM

## 2021-02-05 DIAGNOSIS — R278 Other lack of coordination: Secondary | ICD-10-CM

## 2021-02-05 NOTE — Therapy (Signed)
[place] MAIN [facility] SERVICES [address] [place], [place], [address] Phone: [phone]   Fax:  [phone]  Physical Therapy Treatment  Patient Details  Name: Kendra Benton MRN: [id number] Date of Birth: [birth year] Referring Provider (PT): Dr. Sharia Reeve   Encounter Date: 02/05/2021   PT End of Session - 02/05/21 1249     Visit Number 34    Number of Visits 36    Date for PT Re-Evaluation 03/21/21    Authorization Type Traditional Medicare    Authorization Time Period 07/17/2020- 10/09/2020; 10/24/2020-01/16/2021; PN on 07/11/2977; recert 8/92/1194-17/40/8144    PT Start Time 1147    PT Stop Time 1233    PT Time Calculation (min) 46 min    Equipment Utilized During Treatment Gait belt    Activity Tolerance Patient limited by pain    Behavior During Therapy WFL for tasks assessed/performed             Past Medical History:  Diagnosis Date   Allergy    Anxiety    GERD (gastroesophageal reflux disease)    Headache    History of kidney stones    Hypertension    Myocardial infarction ([place])    1997   Osteoporosis    Pneumonia     Past Surgical History:  Procedure Laterality Date   ABDOMINAL HYSTERECTOMY     patient has ovaries   APPENDECTOMY     ARTERY BIOPSY Right 12/30/2017   Procedure: BIOPSY TEMPORAL ARTERY;  Surgeon: Katha Cabal, MD;  Location: [facility] OR[facility];  Service: Vascular;  Laterality: Right;   ARTERY BIOPSY Left 06/18/2018   Procedure: BIOPSY TEMPORAL ARTERY;  Surgeon: Katha Cabal, MD;  Location: [facility];  Service: Vascular;  Laterality: Left;   BACK SURGERY     CARDIAC CATHETERIZATION     CATARACT EXTRACTION W/ INTRAOCULAR LENS  IMPLANT, BILATERAL Bilateral 2010   CHOLECYSTECTOMY N/A 02/28/2016   Procedure: LAPAROSCOPIC CHOLECYSTECTOMY WITH INTRAOPERATIVE CHOLANGIOGRAM;  Surgeon: Dia Crawford III, MD;  Location: [facility] OR[facility];  Service: General;  Laterality: N/A;   LUMBAR FUSION  11/09/2016   L3-L5   SPINE  SURGERY     Disc   TONSILLECTOMY      There were no vitals filed for this visit.   Subjective Assessment - 02/05/21 1152     Subjective Pt repotrs a very busy weekend, she had to cencel appointment yesterday to recover. Patient reports she is performing 45 min of yoga every other day and it has been helping. Reports 3/10 LBP today.    Pertinent History Patient reports history of chronic LBP with past lumbar fusion surgery on 11/09/2016 and most recent lumbar facet joint block procedure on 06/19/2020. She reports past medical history of spondylosis, Lung Cancer, HTN. She reports increased difficulty performing ADL's yet still independent but has paid services who assist with cleaning.    Limitations Standing;Lifting;Walking;House hold activities    How long can you sit comfortably? no restrictions    How long can you stand comfortably? <30 min    How long can you walk comfortably? <10 min    Patient Stated Goals Improve my pain and walk better with no falls.    Currently in Pain? Yes    Pain Score 3     Pain Location Back    Pain Orientation Lower;Posterior;Right    Pain Descriptors / Indicators Aching    Pain Type Chronic pain    Pain Onset More than a month ago  Interventions:    Manual therapy: Stretching to lumbar/hips: single knee to chest, Lower trunk rotation, hamstrings, Piriformis, supine leg crossover 2 sets x 30 sec hold BLE   Therapeutic exercises: Patient performed sidelye stretching (with towel roll under R lateral hip) with LLE crossover; prolonged hold x 5 min.    Neuromuscular Re-ed:  Warrior 2 pose with upward gaze, 2 x 30 second BLE; Romberg stance on airex pad x 30 seconds; Narrow BOS on airex pad 2 x 30 seconds; Tandem stance on airex pad, 1 x 30 seconds BLE, pt reported dizziness after this exercise;    Clinical impression: Pt demo excellent motivation throughout session today. She did experience SOB after walking from lobby to therapy mat in  gym; required 3 minutes sitting to recover. Patient performed well today - responded well to stretching with improved left sided low back symptoms. She demo improved standing stability with yoga poses and stability challenges on airex pad. She did report significant dizziness after tandem stance on airex pad; symptoms improved after 2 minutes of sitting and prior to pt leaving session. Pt will benefit from further skilled PT to improve BLE strength, balance and functional mobility.       PT Short Term Goals - 10/24/20 1456       PT SHORT TERM GOAL #1   Title Patient will be independent in home exercise program to improve pain relief/ strength/mobility for better functional independence with ADLs.    Baseline 07/17/2020- Patient doesn't have any formal HEP in place. 10/24/2020- Patient reports compliance with current HEP for Low back stretching, core and LE strengthening.    Time 6    Period Weeks    Status Achieved    Target Date 08/28/20               PT Long Term Goals - 01/24/21 0818       PT LONG TERM GOAL #1   Title Patient will increase FOTO score to equal to or greater than 63 to demonstrate statistically significant improvement in mobility and quality of life.    Baseline 07/17/2020: FOTO score=60; 10/24/2020= 40; 01/09/2021= 60. 01/24/2021- Did not update since just administered on 01/09/2021    Time 8    Period Weeks    Status On-going    Target Date 03/21/21      PT LONG TERM GOAL #2   Title Pt will decrease worst back pain as reported on NPRS by at least 2 points in order to demonstrate clinically significant reduction in back pain.    Baseline 07/17/2020- 5/10 LBP at worst; 08/29/20: reaches around a 7/10 almost daily in the past week. 10/24/2020= Patient rates at a 3/10 current and up to 6/10 at worst. 01/09/2021-Patient reports ongoing LBP (right sided today) rates at a 5/10. 01/24/2021= Patient reports LBP around 4/10    Time 8    Period Weeks    Status On-going    Target  Date 03/21/21      PT LONG TERM GOAL #3   Title Patient (> 58 years old) will complete five times sit to stand test in <13 seconds indicating an increased LE strength and improved balance.   Previously was 18sec goal   Baseline 07/17/2020- 26.02 sec; 08/29/20: 14.34sec  10/24/2020 = 22.45 sec; 01/09/2021= 14.84 sec. 01/24/2021=15.0 sec without UE support    Time 12    Period Weeks    Status On-going      PT LONG TERM GOAL #4   Title Patient will  increase Berg Balance score by > 4 points to demonstrate decreased fall risk during functional activities.   Will update to either a SLS goal or a DGI/FGA goal next session.   Baseline 07/17/2020- BERG = 49/56; 08/29/20: 50/56 (will convert to new balance goal, as pt cannot improve any more (statistically) due to ceiling effect and medical restrictions)    Time 12    Period Weeks    Status Revised      PT LONG TERM GOAL #5   Title Patient will increase 10 meter walk test to >1.75m/s as to improve gait speed for better community ambulation and to reduce fall risk.    Baseline 07/17/2020= 10MWT avg=11.0 sec (0.91 m/s) without an AD; 08/29/20: 1.76m/s self selected, 1.37m/s;    Time 12    Period Weeks    Status Achieved      Additional Long Term Goals   Additional Long Term Goals Yes      PT LONG TERM GOAL #6   Title Patient will increase Functional Gait Assessment score to >23/30 as to reduce fall risk and improve dynamic gait safety with community ambulation.    Baseline 10/24/2020= FGA score of 20/30; 01/09/2021= 27/30  (will keep goal active to ensure patient remains compliant). 01/24/2021=28/30    Time 12    Period Weeks    Status Achieved      PT LONG TERM GOAL #7   Title Patient will ascend/descend 4 steps with 1 rail while holding a bag (ex. grocery) - demo reciprocal steps with mod independence for improved independence and step negotiation    Baseline 01/24/2021- Patient requires step to gait and railing and unable to hold a bag of grocery when  stepping up    Time 8    Period Weeks    Status New    Target Date 03/21/21      PT LONG TERM GOAL #8   Title Patient will exhibit proper body mechanics with household activities to reduce risk of injury to spine.    Baseline 01/24/2021: Patient admits to decreased knowledge of safe and proper body mechanics with performing housework.    Time 8    Period Weeks    Status New    Target Date 03/21/21                   Plan - 02/05/21 1249     Clinical Impression Statement Pt demo excellent motivation throughout session today. She did experience SOB after walking from lobby to therapy mat in gym; required 3 minutes sitting to recover. Patient performed well today - responded well to stretching with improved left sided low back symptoms. She demo improved standing stability with yoga poses and stability challenges on airex pad. She did report significant dizziness after tandem stance on airex pad; symptoms improved after 2 minutes of sitting and prior to pt leaving session. Pt will benefit from further skilled PT to improve BLE strength, balance and functional mobility.    Personal Factors and Comorbidities Age;Comorbidity 3+    Comorbidities Osteoporosis, HTN, CHF    Examination-Activity Limitations Bend;Caring for Others;Carry;Lift;Reach Overhead;Sit;Squat;Stairs;Stand    Examination-Participation Restrictions Cleaning;Community Activity;Laundry;Shop;Yard Work    Stability/Clinical Decision Making Stable/Uncomplicated    Rehab Potential Good    PT Frequency 2x / week    PT Duration 2 weeks    PT Treatment/Interventions ADLs/Self Care Home Management;Cryotherapy;Moist Heat;DME Instruction;Stair training;Functional mobility training;Therapeutic activities;Therapeutic exercise;Balance training;Neuromuscular re-education;Patient/family education;Manual techniques    PT Next Visit Plan Manual therapy/PROM  for low back symptoms, balance/core/LE strengthening and training for improved  mobility and function. Reassessment due next visit    PT Home Exercise Plan No changes    Consulted and Agree with Plan of Care Patient             Patient will benefit from skilled therapeutic intervention in order to improve the following deficits and impairments:  Abnormal gait, Decreased balance, Decreased coordination, Decreased activity tolerance, Cardiopulmonary status limiting activity, Decreased endurance, Decreased mobility, Decreased strength, Difficulty walking, Pain  Visit Diagnosis: Abnormality of gait and mobility  Difficulty in walking, not elsewhere classified  Muscle weakness (generalized)  Unsteadiness on feet  Other abnormalities of gait and mobility  Other lack of coordination     Problem List Patient Active Problem List   Diagnosis Date Noted   Fall (on) (from) other stairs and steps, initial encounter 08/06/2020   Lumbar facet joint syndrome 06/18/2020   Spondylosis without myelopathy or radiculopathy, lumbosacral region 06/18/2020   DDD (degenerative disc disease), lumbosacral 06/18/2020   COVID 06/04/2020   Chronic pain syndrome 05/16/2020   Pharmacologic therapy 05/16/2020   Disorder of skeletal system 05/16/2020   Problems influencing health status 05/16/2020   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   Chronic hip pain (2ry area of Pain) (Bilateral) (R>L) 05/16/2020   Chronic lower extremity pain (Left) 05/16/2020   Numbness and tingling of both feet (intermittent/recurrent) 05/16/2020   Pain in rib (Bilateral) (R>L) (intermittent) 05/16/2020   Chronic knee pain (Left) 05/16/2020   Patellar pain (Left) (chronic, intermittent) 05/16/2020   Hard of hearing 05/16/2020   Carcinoma, lung, right ([place]) 06/25/2019   S/P partial lobectomy of lung 02/25/2019   Mass of upper lobe of right lung 01/06/2019   Chronic nonintractable headache 06/14/2018   Head ache 12/03/2017   Sinus bradycardia 10/06/2017   Chronic low back pain (1ry area of Pain)  (Bilateral) (L>R) w/o sciatica 08/17/2017   Panic attack as reaction to stress 08/17/2017   Anxiety 06/15/2017   Atypical chest pain 06/15/2017   Hyperlipidemia 06/15/2017   Cholecystitis with cholelithiasis 02/27/2016   Increased frequency of urination 01/27/2015   History of night sweats 05/01/2014   Dupuytren's contracture 07/18/2013   Inverted nipple 04/18/2013   Depression 04/23/2010   Fibromyalgia 04/23/2010   Hypercholesterolemia 04/23/2010   Essential hypertension 04/23/2010   Hypothyroidism 04/23/2010   Irritable colon 04/23/2010   Osteoarthritis 04/23/2010    Patrina Levering PT, DPT  [facility] 02/05/2021, 12:52 PM  [facility] MAIN [facility] SERVICES [address] [place], [place], [address] Phone: [phone]   Fax:  [phone]  Name: Kendra Benton MRN: [id number] Date of Birth: 1940/11/02

## 2021-02-14 ENCOUNTER — Ambulatory Visit: Payer: Medicare Other | Attending: Internal Medicine

## 2021-02-14 ENCOUNTER — Other Ambulatory Visit: Payer: Self-pay

## 2021-02-14 DIAGNOSIS — R269 Unspecified abnormalities of gait and mobility: Secondary | ICD-10-CM | POA: Diagnosis not present

## 2021-02-14 DIAGNOSIS — R2689 Other abnormalities of gait and mobility: Secondary | ICD-10-CM | POA: Diagnosis present

## 2021-02-14 DIAGNOSIS — R262 Difficulty in walking, not elsewhere classified: Secondary | ICD-10-CM | POA: Diagnosis present

## 2021-02-14 DIAGNOSIS — R2681 Unsteadiness on feet: Secondary | ICD-10-CM | POA: Diagnosis present

## 2021-02-14 DIAGNOSIS — R278 Other lack of coordination: Secondary | ICD-10-CM | POA: Insufficient documentation

## 2021-02-14 DIAGNOSIS — M6281 Muscle weakness (generalized): Secondary | ICD-10-CM

## 2021-02-14 NOTE — Therapy (Signed)
[place] MAIN [facility] SERVICES [address] [place], [place], [address] Phone: [phone]   Fax:  [phone]  Physical Therapy Treatment  Patient Details  Name: Kendra Benton MRN: [id number] Date of Birth: 1941-03-14 Referring Provider (PT): Dr. Sharia Reeve   Encounter Date: 02/14/2021   PT End of Session - 02/14/21 1517     Visit Number 35    Number of Visits 50    Date for PT Re-Evaluation 03/21/21    Authorization Type Traditional Medicare    Authorization Time Period 07/17/2020- 10/09/2020; 10/24/2020-01/16/2021; PN on 08/11/3297; recert 2/42/6834-19/62/2297    PT Start Time 1516    PT Stop Time 1603    PT Time Calculation (min) 47 min    Equipment Utilized During Treatment Gait belt    Activity Tolerance Patient limited by pain    Behavior During Therapy WFL for tasks assessed/performed             Past Medical History:  Diagnosis Date   Allergy    Anxiety    GERD (gastroesophageal reflux disease)    Headache    History of kidney stones    Hypertension    Myocardial infarction ([place])    1997   Osteoporosis    Pneumonia     Past Surgical History:  Procedure Laterality Date   ABDOMINAL HYSTERECTOMY     patient has ovaries   APPENDECTOMY     ARTERY BIOPSY Right 12/30/2017   Procedure: BIOPSY TEMPORAL ARTERY;  Surgeon: Katha Cabal, MD;  Location: [facility] ORS;  Service: Vascular;  Laterality: Right;   ARTERY BIOPSY Left 06/18/2018   Procedure: BIOPSY TEMPORAL ARTERY;  Surgeon: Katha Cabal, MD;  Location: [facility];  Service: Vascular;  Laterality: Left;   BACK SURGERY     CARDIAC CATHETERIZATION     CATARACT EXTRACTION W/ INTRAOCULAR LENS  IMPLANT, BILATERAL Bilateral 2010   CHOLECYSTECTOMY N/A 02/28/2016   Procedure: LAPAROSCOPIC CHOLECYSTECTOMY WITH INTRAOPERATIVE CHOLANGIOGRAM;  Surgeon: Dia Crawford III, MD;  Location: [facility] OR[facility];  Service: General;  Laterality: N/A;   LUMBAR FUSION  11/09/2016   L3-L5   SPINE  SURGERY     Disc   TONSILLECTOMY      There were no vitals filed for this visit.   Subjective Assessment - 02/14/21 1515     Subjective Patient reports being woozy today.  My back pain is pretty bad today- about a 6/10 - I have been doing my yoga exercises and doing okay just having a rough day today.    Pertinent History Patient reports history of chronic LBP with past lumbar fusion surgery on 11/09/2016 and most recent lumbar facet joint block procedure on 06/19/2020. She reports past medical history of spondylosis, Lung Cancer, HTN. She reports increased difficulty performing ADL's yet still independent but has paid services who assist with cleaning.    Limitations Standing;Lifting;Walking;House hold activities    How long can you sit comfortably? no restrictions    How long can you stand comfortably? <30 min    How long can you walk comfortably? <10 min    Patient Stated Goals Improve my pain and walk better with no falls.    Currently in Pain? Yes    Pain Score 6     Pain Location Back    Pain Orientation Right;Posterior;Lower    Pain Descriptors / Indicators Aching    Pain Type Chronic pain    Pain Onset More than a month ago    Pain Frequency  Constant    Aggravating Factors  transfers, Prolonged standing/Bending/lifting    Pain Relieving Factors Rest, Meds    Effect of Pain on Daily Activities Difficulty with mobility, ADL's, and housework              INTERVENTIONS:    BP: 149/69 mmHg  HR= 88 bpm (Seated) Left arm at rest.  BP: 133/59 mmHg  Manual therapy: Stretching to lumbar/hips: single knee to chest, Long axis LE distraction, Lower trunk rotation x 15 min total. Patient presents with mild increased tightness along right lumbar paraspinal region.       Therapeutic exercises: Patient attempted to perform sidelye stretching (with towel roll under R lateral hip) with LLE crossover- however experienced increased pain with positioning and unable to continue the  stretch. Switched to standing and patient performed leaning into wall x 20 sec hold x 3 sets. She required verbal cues to perform correctly- Denied any increase in back pain with stetching.  Muscle energy strategies- Manual resistive hip extension in varying angles x 5 sec hold x multiple attempts. Isometric hip flex in varying angles of hip flex x 5 sec - multiple attempts. Patient reports soreness after stretching and exercises but denies any worsening.  Patient then performed sidelye thoracic rotation - VC, Visual demonstration to perform correctly for 20 sec hold x 3 sets.   Clinical Impression: Patient presents in more pain today than past several visits. She responded favorably to manual techniques and exercise training to improve her flexibility and muscle imbalance. She was able to respond to contract relax activities and standing stretching today better than sidelye. Pt will benefit from further skilled PT to improve BLE strength, balance and functional mobility.                      PT Education - 02/14/21 1619     Education Details exercise technique    Person(s) Educated Patient    Methods Explanation;Demonstration;Tactile cues;Verbal cues    Comprehension Verbalized understanding;Returned demonstration;Verbal cues required;Tactile cues required;Need further instruction              PT Short Term Goals - 10/24/20 1456       PT SHORT TERM GOAL #1   Title Patient will be independent in home exercise program to improve pain relief/ strength/mobility for better functional independence with ADLs.    Baseline 07/17/2020- Patient doesn't have any formal HEP in place. 10/24/2020- Patient reports compliance with current HEP for Low back stretching, core and LE strengthening.    Time 6    Period Weeks    Status Achieved    Target Date 08/28/20               PT Long Term Goals - 01/24/21 0818       PT LONG TERM GOAL #1   Title Patient will increase FOTO  score to equal to or greater than 63 to demonstrate statistically significant improvement in mobility and quality of life.    Baseline 07/17/2020: FOTO score=60; 10/24/2020= 40; 01/09/2021= 60. 01/24/2021- Did not update since just administered on 01/09/2021    Time 8    Period Weeks    Status On-going    Target Date 03/21/21      PT LONG TERM GOAL #2   Title Pt will decrease worst back pain as reported on NPRS by at least 2 points in order to demonstrate clinically significant reduction in back pain.    Baseline 07/17/2020- 5/10 LBP at  worst; 08/29/20: reaches around a 7/10 almost daily in the past week. 10/24/2020= Patient rates at a 3/10 current and up to 6/10 at worst. 01/09/2021-Patient reports ongoing LBP (right sided today) rates at a 5/10. 01/24/2021= Patient reports LBP around 4/10    Time 8    Period Weeks    Status On-going    Target Date 03/21/21      PT LONG TERM GOAL #3   Title Patient (> 55 years old) will complete five times sit to stand test in <13 seconds indicating an increased LE strength and improved balance.   Previously was 18sec goal   Baseline 07/17/2020- 26.02 sec; 08/29/20: 14.34sec  10/24/2020 = 22.45 sec; 01/09/2021= 14.84 sec. 01/24/2021=15.0 sec without UE support    Time 12    Period Weeks    Status On-going      PT LONG TERM GOAL #4   Title Patient will increase Berg Balance score by > 4 points to demonstrate decreased fall risk during functional activities.   Will update to either a SLS goal or a DGI/FGA goal next session.   Baseline 07/17/2020- BERG = 49/56; 08/29/20: 50/56 (will convert to new balance goal, as pt cannot improve any more (statistically) due to ceiling effect and medical restrictions)    Time 12    Period Weeks    Status Revised      PT LONG TERM GOAL #5   Title Patient will increase 10 meter walk test to >1.63m/s as to improve gait speed for better community ambulation and to reduce fall risk.    Baseline 07/17/2020= 10MWT avg=11.0 sec (0.91 m/s)  without an AD; 08/29/20: 1.78m/s self selected, 1.60m/s;    Time 12    Period Weeks    Status Achieved      Additional Long Term Goals   Additional Long Term Goals Yes      PT LONG TERM GOAL #6   Title Patient will increase Functional Gait Assessment score to >23/30 as to reduce fall risk and improve dynamic gait safety with community ambulation.    Baseline 10/24/2020= FGA score of 20/30; 01/09/2021= 27/30  (will keep goal active to ensure patient remains compliant). 01/24/2021=28/30    Time 12    Period Weeks    Status Achieved      PT LONG TERM GOAL #7   Title Patient will ascend/descend 4 steps with 1 rail while holding a bag (ex. grocery) - demo reciprocal steps with mod independence for improved independence and step negotiation    Baseline 01/24/2021- Patient requires step to gait and railing and unable to hold a bag of grocery when stepping up    Time 8    Period Weeks    Status New    Target Date 03/21/21      PT LONG TERM GOAL #8   Title Patient will exhibit proper body mechanics with household activities to reduce risk of injury to spine.    Baseline 01/24/2021: Patient admits to decreased knowledge of safe and proper body mechanics with performing housework.    Time 8    Period Weeks    Status New    Target Date 03/21/21                   Plan - 02/14/21 1517     Clinical Impression Statement Patient presents in more pain today than past several visits. She responded favorably to manual techniques and exercise training to improve her flexibility and muscle imbalance. She was  able to respond to contract relax activities and standing stretching today better than sidelye. Pt will benefit from further skilled PT to improve BLE strength, balance and functional mobility.    Personal Factors and Comorbidities Age;Comorbidity 3+    Comorbidities Osteoporosis, HTN, CHF    Examination-Activity Limitations Bend;Caring for Others;Carry;Lift;Reach  Overhead;Sit;Squat;Stairs;Stand    Examination-Participation Restrictions Cleaning;Community Activity;Laundry;Shop;Yard Work    Stability/Clinical Decision Making Stable/Uncomplicated    Rehab Potential Good    PT Frequency 2x / week    PT Duration 2 weeks    PT Treatment/Interventions ADLs/Self Care Home Management;Cryotherapy;Moist Heat;DME Instruction;Stair training;Functional mobility training;Therapeutic activities;Therapeutic exercise;Balance training;Neuromuscular re-education;Patient/family education;Manual techniques    PT Next Visit Plan Manual therapy/PROM for low back symptoms, balance/core/LE strengthening and training for improved mobility and function. Reassessment due next visit    PT Home Exercise Plan No changes    Consulted and Agree with Plan of Care Patient             Patient will benefit from skilled therapeutic intervention in order to improve the following deficits and impairments:  Abnormal gait, Decreased balance, Decreased coordination, Decreased activity tolerance, Cardiopulmonary status limiting activity, Decreased endurance, Decreased mobility, Decreased strength, Difficulty walking, Pain  Visit Diagnosis: Abnormality of gait and mobility  Difficulty in walking, not elsewhere classified  Muscle weakness (generalized)  Unsteadiness on feet     Problem List Patient Active Problem List   Diagnosis Date Noted   Fall (on) (from) other stairs and steps, initial encounter 08/06/2020   Lumbar facet joint syndrome 06/18/2020   Spondylosis without myelopathy or radiculopathy, lumbosacral region 06/18/2020   DDD (degenerative disc disease), lumbosacral 06/18/2020   COVID 06/04/2020   Chronic pain syndrome 05/16/2020   Pharmacologic therapy 05/16/2020   Disorder of skeletal system 05/16/2020   Problems influencing health status 05/16/2020   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   Chronic hip pain (2ry area of Pain) (Bilateral) (R>L) 05/16/2020    Chronic lower extremity pain (Left) 05/16/2020   Numbness and tingling of both feet (intermittent/recurrent) 05/16/2020   Pain in rib (Bilateral) (R>L) (intermittent) 05/16/2020   Chronic knee pain (Left) 05/16/2020   Patellar pain (Left) (chronic, intermittent) 05/16/2020   Hard of hearing 05/16/2020   Carcinoma, lung, right ([place]) 06/25/2019   S/P partial lobectomy of lung 02/25/2019   Mass of upper lobe of right lung 01/06/2019   Chronic nonintractable headache 06/14/2018   Head ache 12/03/2017   Sinus bradycardia 10/06/2017   Chronic low back pain (1ry area of Pain) (Bilateral) (L>R) w/o sciatica 08/17/2017   Panic attack as reaction to stress 08/17/2017   Anxiety 06/15/2017   Atypical chest pain 06/15/2017   Hyperlipidemia 06/15/2017   Cholecystitis with cholelithiasis 02/27/2016   Increased frequency of urination 01/27/2015   History of night sweats 05/01/2014   Dupuytren's contracture 07/18/2013   Inverted nipple 04/18/2013   Depression 04/23/2010   Fibromyalgia 04/23/2010   Hypercholesterolemia 04/23/2010   Essential hypertension 04/23/2010   Hypothyroidism 04/23/2010   Irritable colon 04/23/2010   Osteoarthritis 04/23/2010    Lewis Moccasin, PT 02/14/2021, 4:33 PM  [place] MAIN [facility] SERVICES [address] [place], [place], [address] Phone: [phone]   Fax:  [phone]  Name: Kendra Benton MRN: [id number] Date of Birth: July 29, 1940

## 2021-02-21 ENCOUNTER — Other Ambulatory Visit: Payer: Self-pay

## 2021-02-21 ENCOUNTER — Ambulatory Visit: Payer: Medicare Other

## 2021-02-21 DIAGNOSIS — R269 Unspecified abnormalities of gait and mobility: Secondary | ICD-10-CM

## 2021-02-21 DIAGNOSIS — R2681 Unsteadiness on feet: Secondary | ICD-10-CM

## 2021-02-21 DIAGNOSIS — R262 Difficulty in walking, not elsewhere classified: Secondary | ICD-10-CM

## 2021-02-21 DIAGNOSIS — R278 Other lack of coordination: Secondary | ICD-10-CM

## 2021-02-21 NOTE — Therapy (Signed)
[place] MAIN [facility] SERVICES [address] [place], [place], [address] Phone: [phone]   Fax:  [phone]  Physical Therapy Treatment  Patient Details  Name: Kendra Benton MRN: [id number] Date of Birth: Aug 18, 1940 Referring Provider (PT): Dr. Sharia Reeve   Encounter Date: 02/21/2021   PT End of Session - 02/21/21 0904     Visit Number 36    Number of Visits 50    Date for PT Re-Evaluation 03/21/21    Authorization Type Traditional Medicare    Authorization Time Period 07/17/2020- 10/09/2020; 10/24/2020-01/16/2021; PN on 11/15/6293; recert 2/84/1324-40/03/2724    PT Start Time 0854    PT Stop Time 0935    PT Time Calculation (min) 41 min    Equipment Utilized During Treatment Gait belt    Activity Tolerance Patient limited by pain    Behavior During Therapy WFL for tasks assessed/performed             Past Medical History:  Diagnosis Date   Allergy    Anxiety    GERD (gastroesophageal reflux disease)    Headache    History of kidney stones    Hypertension    Myocardial infarction [facility])    1997   Osteoporosis    Pneumonia     Past Surgical History:  Procedure Laterality Date   ABDOMINAL HYSTERECTOMY     patient has ovaries   APPENDECTOMY     ARTERY BIOPSY Right 12/30/2017   Procedure: BIOPSY TEMPORAL ARTERY;  Surgeon: Katha Cabal, MD;  Location: [facility] ORS;  Service: Vascular;  Laterality: Right;   ARTERY BIOPSY Left 06/18/2018   Procedure: BIOPSY TEMPORAL ARTERY;  Surgeon: Katha Cabal, MD;  Location: [facility] ORS;  Service: Vascular;  Laterality: Left;   BACK SURGERY     CARDIAC CATHETERIZATION     CATARACT EXTRACTION W/ INTRAOCULAR LENS  IMPLANT, BILATERAL Bilateral 2010   CHOLECYSTECTOMY N/A 02/28/2016   Procedure: LAPAROSCOPIC CHOLECYSTECTOMY WITH INTRAOPERATIVE CHOLANGIOGRAM;  Surgeon: Dia Crawford III, MD;  Location: [facility] ORS;  Service: General;  Laterality: N/A;   LUMBAR FUSION  11/09/2016   L3-L5   SPINE  SURGERY     Disc   TONSILLECTOMY      There were no vitals filed for this visit.   Subjective Assessment - 02/21/21 0859     Subjective Patient reports back is about the same but reports less dizzy.    Pertinent History Patient reports history of chronic LBP with past lumbar fusion surgery on 11/09/2016 and most recent lumbar facet joint block procedure on 06/19/2020. She reports past medical history of spondylosis, Lung Cancer, HTN. She reports increased difficulty performing ADL's yet still independent but has paid services who assist with cleaning.    Limitations Standing;Lifting;Walking;House hold activities    How long can you sit comfortably? no restrictions    How long can you stand comfortably? <30 min    How long can you walk comfortably? <10 min    Patient Stated Goals Improve my pain and walk better with no falls.    Currently in Pain? Yes    Pain Location Back    Pain Orientation Posterior;Lower    Pain Descriptors / Indicators Aching    Pain Type Chronic pain    Pain Onset More than a month ago    Pain Frequency Constant    Aggravating Factors  Transfers, Prolonged standing/bending/lifting    Pain Relieving Factors Rest, Meds    Effect of Pain on Daily Activities Difficulty with  mobility, ADL's and housework            Interventions:  Therapeutic Exercises:   Patient side plank: Attempted to hold 4-5 seconds on right side. Patient was able to perform with some difficulty yet was able to complete approx 3 times today with no report of pain.   Standing QL stretch - hold 30 sec x 4. Patient  with no report of increased pain today. She required increased VC and Visual demo to perform correctly.  Utilized Geologist, engineering for feedback as well today with good results- patient able to perform with correct technique.   Manual therapy: Passisve stretching to lumbar/hips: single knee to chest, Lower trunk rotation, hamstrings, Piriformis, supine leg crossover 2 sets x 30 sec hold BLE.  STM using massage rolling stick to right side lumbar/posteriolateral section.   Clinical Impression: Patient without much relief with stretching today but also denied any worsening of symptoms. She reported feeling a good stretch but continues to report low back pain. She was able to perform more core activity including side plank with moderate difficulty. Pt will benefit from further skilled PT to improve BLE strength, balance and functional mobility.                            PT Education - 02/21/21 1013     Education Details exercise technique/posture    Person(s) Educated Patient    Methods Explanation;Demonstration;Tactile cues;Verbal cues    Comprehension Verbalized understanding;Returned demonstration;Verbal cues required;Tactile cues required;Need further instruction              PT Short Term Goals - 10/24/20 1456       PT SHORT TERM GOAL #1   Title Patient will be independent in home exercise program to improve pain relief/ strength/mobility for better functional independence with ADLs.    Baseline 07/17/2020- Patient doesn't have any formal HEP in place. 10/24/2020- Patient reports compliance with current HEP for Low back stretching, core and LE strengthening.    Time 6    Period Weeks    Status Achieved    Target Date 08/28/20               PT Long Term Goals - 01/24/21 0818       PT LONG TERM GOAL #1   Title Patient will increase FOTO score to equal to or greater than 63 to demonstrate statistically significant improvement in mobility and quality of life.    Baseline 07/17/2020: FOTO score=60; 10/24/2020= 40; 01/09/2021= 60. 01/24/2021- Did not update since just administered on 01/09/2021    Time 8    Period Weeks    Status On-going    Target Date 03/21/21      PT LONG TERM GOAL #2   Title Pt will decrease worst back pain as reported on NPRS by at least 2 points in order to demonstrate clinically significant reduction in back pain.     Baseline 07/17/2020- 5/10 LBP at worst; 08/29/20: reaches around a 7/10 almost daily in the past week. 10/24/2020= Patient rates at a 3/10 current and up to 6/10 at worst. 01/09/2021-Patient reports ongoing LBP (right sided today) rates at a 5/10. 01/24/2021= Patient reports LBP around 4/10    Time 8    Period Weeks    Status On-going    Target Date 03/21/21      PT LONG TERM GOAL #3   Title Patient (> 48 years old) will complete five times sit  to stand test in <13 seconds indicating an increased LE strength and improved balance.   Previously was 18sec goal   Baseline 07/17/2020- 26.02 sec; 08/29/20: 14.34sec  10/24/2020 = 22.45 sec; 01/09/2021= 14.84 sec. 01/24/2021=15.0 sec without UE support    Time 12    Period Weeks    Status On-going      PT LONG TERM GOAL #4   Title Patient will increase Berg Balance score by > 4 points to demonstrate decreased fall risk during functional activities.   Will update to either a SLS goal or a DGI/FGA goal next session.   Baseline 07/17/2020- BERG = 49/56; 08/29/20: 50/56 (will convert to new balance goal, as pt cannot improve any more (statistically) due to ceiling effect and medical restrictions)    Time 12    Period Weeks    Status Revised      PT LONG TERM GOAL #5   Title Patient will increase 10 meter walk test to >1.51m/s as to improve gait speed for better community ambulation and to reduce fall risk.    Baseline 07/17/2020= 10MWT avg=11.0 sec (0.91 m/s) without an AD; 08/29/20: 1.99m/s self selected, 1.81m/s;    Time 12    Period Weeks    Status Achieved      Additional Long Term Goals   Additional Long Term Goals Yes      PT LONG TERM GOAL #6   Title Patient will increase Functional Gait Assessment score to >23/30 as to reduce fall risk and improve dynamic gait safety with community ambulation.    Baseline 10/24/2020= FGA score of 20/30; 01/09/2021= 27/30  (will keep goal active to ensure patient remains compliant). 01/24/2021=28/30    Time 12    Period  Weeks    Status Achieved      PT LONG TERM GOAL #7   Title Patient will ascend/descend 4 steps with 1 rail while holding a bag (ex. grocery) - demo reciprocal steps with mod independence for improved independence and step negotiation    Baseline 01/24/2021- Patient requires step to gait and railing and unable to hold a bag of grocery when stepping up    Time 8    Period Weeks    Status New    Target Date 03/21/21      PT LONG TERM GOAL #8   Title Patient will exhibit proper body mechanics with household activities to reduce risk of injury to spine.    Baseline 01/24/2021: Patient admits to decreased knowledge of safe and proper body mechanics with performing housework.    Time 8    Period Weeks    Status New    Target Date 03/21/21                   Plan - 02/21/21 0924     Clinical Impression Statement Patient without much relief with stretching today but also denied any worsening of symptoms. She reported feeling a good stretch but continues to report low back pain. She was able to perform more core activity including side plank with moderate difficulty. Pt will benefit from further skilled PT to improve BLE strength, balance and functional mobility.    Personal Factors and Comorbidities Age;Comorbidity 3+    Comorbidities Osteoporosis, HTN, CHF    Examination-Activity Limitations Bend;Caring for Others;Carry;Lift;Reach Overhead;Sit;Squat;Stairs;Stand    Examination-Participation Restrictions Cleaning;Community Activity;Laundry;Shop;Yard Work    Stability/Clinical Decision Making Stable/Uncomplicated    Rehab Potential Good    PT Frequency 2x / week    PT  Duration 2 weeks    PT Treatment/Interventions ADLs/Self Care Home Management;Cryotherapy;Moist Heat;DME Instruction;Stair training;Functional mobility training;Therapeutic activities;Therapeutic exercise;Balance training;Neuromuscular re-education;Patient/family education;Manual techniques    PT Next Visit Plan Manual  therapy/PROM for low back symptoms, balance/core/LE strengthening and training for improved mobility and function. Reassessment due next visit    PT Home Exercise Plan No changes    Consulted and Agree with Plan of Care Patient             Patient will benefit from skilled therapeutic intervention in order to improve the following deficits and impairments:  Abnormal gait, Decreased balance, Decreased coordination, Decreased activity tolerance, Cardiopulmonary status limiting activity, Decreased endurance, Decreased mobility, Decreased strength, Difficulty walking, Pain  Visit Diagnosis: Abnormality of gait and mobility  Difficulty in walking, not elsewhere classified  Other lack of coordination  Unsteadiness on feet     Problem List Patient Active Problem List   Diagnosis Date Noted   Fall (on) (from) other stairs and steps, initial encounter 08/06/2020   Lumbar facet joint syndrome 06/18/2020   Spondylosis without myelopathy or radiculopathy, lumbosacral region 06/18/2020   DDD (degenerative disc disease), lumbosacral 06/18/2020   COVID 06/04/2020   Chronic pain syndrome 05/16/2020   Pharmacologic therapy 05/16/2020   Disorder of skeletal system 05/16/2020   Problems influencing health status 05/16/2020   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   Chronic hip pain (2ry area of Pain) (Bilateral) (R>L) 05/16/2020   Chronic lower extremity pain (Left) 05/16/2020   Numbness and tingling of both feet (intermittent/recurrent) 05/16/2020   Pain in rib (Bilateral) (R>L) (intermittent) 05/16/2020   Chronic knee pain (Left) 05/16/2020   Patellar pain (Left) (chronic, intermittent) 05/16/2020   Hard of hearing 05/16/2020   Carcinoma, lung, right ([place]) 06/25/2019   S/P partial lobectomy of lung 02/25/2019   Mass of upper lobe of right lung 01/06/2019   Chronic nonintractable headache 06/14/2018   Head ache 12/03/2017   Sinus bradycardia 10/06/2017   Chronic low back pain (1ry  area of Pain) (Bilateral) (L>R) w/o sciatica 08/17/2017   Panic attack as reaction to stress 08/17/2017   Anxiety 06/15/2017   Atypical chest pain 06/15/2017   Hyperlipidemia 06/15/2017   Cholecystitis with cholelithiasis 02/27/2016   Increased frequency of urination 01/27/2015   History of night sweats 05/01/2014   Dupuytren's contracture 07/18/2013   Inverted nipple 04/18/2013   Depression 04/23/2010   Fibromyalgia 04/23/2010   Hypercholesterolemia 04/23/2010   Essential hypertension 04/23/2010   Hypothyroidism 04/23/2010   Irritable colon 04/23/2010   Osteoarthritis 04/23/2010    Lewis Moccasin, PT 02/21/2021, 10:15 AM  [place] MAIN [facility] SERVICES [address] [place], [place], [address] Phone: [phone]   Fax:  [phone]  Name: NAYLENE FOELL MRN: [id number] Date of Birth: 04-Jan-1941

## 2021-02-25 ENCOUNTER — Ambulatory Visit: Payer: Medicare Other

## 2021-02-27 ENCOUNTER — Ambulatory Visit: Payer: Medicare Other

## 2021-02-27 ENCOUNTER — Other Ambulatory Visit: Payer: Self-pay

## 2021-02-27 DIAGNOSIS — R269 Unspecified abnormalities of gait and mobility: Secondary | ICD-10-CM | POA: Diagnosis not present

## 2021-02-27 DIAGNOSIS — R262 Difficulty in walking, not elsewhere classified: Secondary | ICD-10-CM

## 2021-02-27 DIAGNOSIS — R2689 Other abnormalities of gait and mobility: Secondary | ICD-10-CM

## 2021-02-27 DIAGNOSIS — M6281 Muscle weakness (generalized): Secondary | ICD-10-CM

## 2021-02-27 DIAGNOSIS — R278 Other lack of coordination: Secondary | ICD-10-CM

## 2021-02-27 DIAGNOSIS — R2681 Unsteadiness on feet: Secondary | ICD-10-CM

## 2021-02-27 NOTE — Therapy (Signed)
[place] MAIN [facility] SERVICES [address] [place], [place], [address] Phone: [phone]   Fax:  [phone]  Physical Therapy Treatment  Patient Details  Name: Kendra Benton MRN: [id number] Date of Birth: 1940/10/25 Referring Provider (PT): Dr. Sharia Reeve   Encounter Date: 02/27/2021   PT End of Session - 02/27/21 1554     Visit Number 37    Number of Visits 50    Date for PT Re-Evaluation 03/21/21    Authorization Type Traditional Medicare    Authorization Time Period 07/17/2020- 10/09/2020; 10/24/2020-01/16/2021; PN on 01/07/8562; recert 1/49/7026-37/85/8850    PT Start Time 1500    PT Stop Time 1550    PT Time Calculation (min) 50 min    Activity Tolerance Patient tolerated treatment well;No increased pain    Behavior During Therapy WFL for tasks assessed/performed             Past Medical History:  Diagnosis Date   Allergy    Anxiety    GERD (gastroesophageal reflux disease)    Headache    History of kidney stones    Hypertension    Myocardial infarction [facility])    1997   Osteoporosis    Pneumonia     Past Surgical History:  Procedure Laterality Date   ABDOMINAL HYSTERECTOMY     patient has ovaries   APPENDECTOMY     ARTERY BIOPSY Right 12/30/2017   Procedure: BIOPSY TEMPORAL ARTERY;  Surgeon: Katha Cabal, MD;  Location: [facility] ORS;  Service: Vascular;  Laterality: Right;   ARTERY BIOPSY Left 06/18/2018   Procedure: BIOPSY TEMPORAL ARTERY;  Surgeon: Katha Cabal, MD;  Location: [facility] ORS;  Service: Vascular;  Laterality: Left;   BACK SURGERY     CARDIAC CATHETERIZATION     CATARACT EXTRACTION W/ INTRAOCULAR LENS  IMPLANT, BILATERAL Bilateral 2010   CHOLECYSTECTOMY N/A 02/28/2016   Procedure: LAPAROSCOPIC CHOLECYSTECTOMY WITH INTRAOPERATIVE CHOLANGIOGRAM;  Surgeon: Dia Crawford III, MD;  Location: [facility] ORS;  Service: General;  Laterality: N/A;   LUMBAR FUSION  11/09/2016   L3-L5   SPINE SURGERY     Disc    TONSILLECTOMY      There were no vitals filed for this visit.   Subjective Assessment - 02/27/21 1505     Subjective Pt reports her pain is elevated a bit today, no clear reasons. Pt reports conitnued dizziness associated with movements, frequent and daily without extended duration. Pt ha snot been able to partake in yoga in a few days, still working on her exercises in bed at home.    Pertinent History Patient reports history of chronic LBP with past lumbar fusion surgery on 11/09/2016 and most recent lumbar facet joint block procedure on 06/19/2020. She reports past medical history of spondylosis, Lung Cancer, HTN. She reports increased difficulty performing ADL's yet still independent but has paid services who assist with cleaning.    Pain Score 5     Pain Location --   back           137/72 70bpm seated  152/68 71bpm standing x 0 minutes (no symptoms) 157/76 71bpm standing x2 minutes (no symptoms)   INTERVENTION THIS DATE: -hooklying on moist heat low back -myofascial release to bilat QL, errectorspinae (L2/3 level), gluteus medius, good reduction in spasm and pain -Bilat hip inferior glide mobilization in flexion and horizontal abduction 5x30sec Grade III bilat           PT Education - 02/27/21 1511  Education Details when to check orthostatic vitals at home    Person(s) Educated Patient    Methods Explanation    Comprehension Verbalized understanding;Returned demonstration              PT Short Term Goals - 10/24/20 1456       PT SHORT TERM GOAL #1   Title Patient will be independent in home exercise program to improve pain relief/ strength/mobility for better functional independence with ADLs.    Baseline 07/17/2020- Patient doesn't have any formal HEP in place. 10/24/2020- Patient reports compliance with current HEP for Low back stretching, core and LE strengthening.    Time 6    Period Weeks    Status Achieved    Target Date 08/28/20                PT Long Term Goals - 01/24/21 0818       PT LONG TERM GOAL #1   Title Patient will increase FOTO score to equal to or greater than 63 to demonstrate statistically significant improvement in mobility and quality of life.    Baseline 07/17/2020: FOTO score=60; 10/24/2020= 40; 01/09/2021= 60. 01/24/2021- Did not update since just administered on 01/09/2021    Time 8    Period Weeks    Status On-going    Target Date 03/21/21      PT LONG TERM GOAL #2   Title Pt will decrease worst back pain as reported on NPRS by at least 2 points in order to demonstrate clinically significant reduction in back pain.    Baseline 07/17/2020- 5/10 LBP at worst; 08/29/20: reaches around a 7/10 almost daily in the past week. 10/24/2020= Patient rates at a 3/10 current and up to 6/10 at worst. 01/09/2021-Patient reports ongoing LBP (right sided today) rates at a 5/10. 01/24/2021= Patient reports LBP around 4/10    Time 8    Period Weeks    Status On-going    Target Date 03/21/21      PT LONG TERM GOAL #3   Title Patient (> 33 years old) will complete five times sit to stand test in <13 seconds indicating an increased LE strength and improved balance.   Previously was 18sec goal   Baseline 07/17/2020- 26.02 sec; 08/29/20: 14.34sec  10/24/2020 = 22.45 sec; 01/09/2021= 14.84 sec. 01/24/2021=15.0 sec without UE support    Time 12    Period Weeks    Status On-going      PT LONG TERM GOAL #4   Title Patient will increase Berg Balance score by > 4 points to demonstrate decreased fall risk during functional activities.   Will update to either a SLS goal or a DGI/FGA goal next session.   Baseline 07/17/2020- BERG = 49/56; 08/29/20: 50/56 (will convert to new balance goal, as pt cannot improve any more (statistically) due to ceiling effect and medical restrictions)    Time 12    Period Weeks    Status Revised      PT LONG TERM GOAL #5   Title Patient will increase 10 meter walk test to >1.76m/s as to improve gait speed for  better community ambulation and to reduce fall risk.    Baseline 07/17/2020= 10MWT avg=11.0 sec (0.91 m/s) without an AD; 08/29/20: 1.72m/s self selected, 1.53m/s;    Time 12    Period Weeks    Status Achieved      Additional Long Term Goals   Additional Long Term Goals Yes      PT LONG  TERM GOAL #6   Title Patient will increase Functional Gait Assessment score to >23/30 as to reduce fall risk and improve dynamic gait safety with community ambulation.    Baseline 10/24/2020= FGA score of 20/30; 01/09/2021= 27/30  (will keep goal active to ensure patient remains compliant). 01/24/2021=28/30    Time 12    Period Weeks    Status Achieved      PT LONG TERM GOAL #7   Title Patient will ascend/descend 4 steps with 1 rail while holding a bag (ex. grocery) - demo reciprocal steps with mod independence for improved independence and step negotiation    Baseline 01/24/2021- Patient requires step to gait and railing and unable to hold a bag of grocery when stepping up    Time 8    Period Weeks    Status New    Target Date 03/21/21      PT LONG TERM GOAL #8   Title Patient will exhibit proper body mechanics with household activities to reduce risk of injury to spine.    Baseline 01/24/2021: Patient admits to decreased knowledge of safe and proper body mechanics with performing housework.    Time 8    Period Weeks    Status New    Target Date 03/21/21                   Plan - 02/27/21 1555     Clinical Impression Statement Pt arrives in pain exacerbation, insidious onset. Continued with to help with education on pain relief strategies, soft tissue release to address myofascial componenets, and joint mobilization for pain modification. Pt educated on orthosatic BP further, explained full scope of symptoms and nature of the problem to come and go without any clear provocation. Pt has improved pain at end of session, report simproved understanding of monitoring BP fluctuations with symptonms. No  orthostatis this date.    Personal Factors and Comorbidities Age;Comorbidity 3+    Comorbidities Osteoporosis, HTN, CHF    Examination-Activity Limitations Bend;Caring for Others;Carry;Lift;Reach Overhead;Sit;Squat;Stairs;Stand    Examination-Participation Restrictions Cleaning;Community Activity;Laundry;Shop;Yard Work    Engineer, water    Rehab Potential Fair    PT Frequency 2x / week    PT Treatment/Interventions ADLs/Self Care Home Management;Cryotherapy;Moist Heat;DME Instruction;Stair training;Functional mobility training;Therapeutic activities;Therapeutic exercise;Balance training;Neuromuscular re-education;Patient/family education;Manual techniques    PT Next Visit Plan Manual therapy/PROM for low back symptoms, balance/core/LE strengthening and training for improved mobility and function.    PT Home Exercise Plan No updates    Consulted and Agree with Plan of Care Patient             Patient will benefit from skilled therapeutic intervention in order to improve the following deficits and impairments:  Abnormal gait, Decreased balance, Decreased coordination, Decreased activity tolerance, Cardiopulmonary status limiting activity, Decreased endurance, Decreased mobility, Decreased strength, Difficulty walking, Pain  Visit Diagnosis: Abnormality of gait and mobility  Difficulty in walking, not elsewhere classified  Other lack of coordination  Unsteadiness on feet  Muscle weakness (generalized)  Other abnormalities of gait and mobility     Problem List Patient Active Problem List   Diagnosis Date Noted   Fall (on) (from) other stairs and steps, initial encounter 08/06/2020   Lumbar facet joint syndrome 06/18/2020   Spondylosis without myelopathy or radiculopathy, lumbosacral region 06/18/2020   DDD (degenerative disc disease), lumbosacral 06/18/2020   COVID 06/04/2020   Chronic pain  syndrome 05/16/2020   Pharmacologic therapy  05/16/2020   Disorder of skeletal system 05/16/2020   Problems influencing health status 05/16/2020   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   Chronic hip pain (2ry area of Pain) (Bilateral) (R>L) 05/16/2020   Chronic lower extremity pain (Left) 05/16/2020   Numbness and tingling of both feet (intermittent/recurrent) 05/16/2020   Pain in rib (Bilateral) (R>L) (intermittent) 05/16/2020   Chronic knee pain (Left) 05/16/2020   Patellar pain (Left) (chronic, intermittent) 05/16/2020   Hard of hearing 05/16/2020   Carcinoma, lung, right ([place]) 06/25/2019   S/P partial lobectomy of lung 02/25/2019   Mass of upper lobe of right lung 01/06/2019   Chronic nonintractable headache 06/14/2018   Head ache 12/03/2017   Sinus bradycardia 10/06/2017   Chronic low back pain (1ry area of Pain) (Bilateral) (L>R) w/o sciatica 08/17/2017   Panic attack as reaction to stress 08/17/2017   Anxiety 06/15/2017   Atypical chest pain 06/15/2017   Hyperlipidemia 06/15/2017   Cholecystitis with cholelithiasis 02/27/2016   Increased frequency of urination 01/27/2015   History of night sweats 05/01/2014   Dupuytren's contracture 07/18/2013   Inverted nipple 04/18/2013   Depression 04/23/2010   Fibromyalgia 04/23/2010   Hypercholesterolemia 04/23/2010   Essential hypertension 04/23/2010   Hypothyroidism 04/23/2010   Irritable colon 04/23/2010   Osteoarthritis 04/23/2010   4:02 PM, 02/27/21 Etta Grandchild, PT, DPT Physical Therapist - [facility]  Outpatient Physical Therapy- [place] [phone]     [place], PT 02/27/2021, 3:59 PM  [place] MAIN [facility] SERVICES [address] [place], [place], [address] Phone: [phone]   Fax:  [phone]  Name: Kendra Benton MRN: [id number] Date of Birth: Apr 08, 1941

## 2021-03-05 ENCOUNTER — Ambulatory Visit: Payer: Medicare Other

## 2021-03-05 ENCOUNTER — Other Ambulatory Visit: Payer: Self-pay

## 2021-03-05 DIAGNOSIS — R2681 Unsteadiness on feet: Secondary | ICD-10-CM

## 2021-03-05 DIAGNOSIS — M6281 Muscle weakness (generalized): Secondary | ICD-10-CM

## 2021-03-05 DIAGNOSIS — R269 Unspecified abnormalities of gait and mobility: Secondary | ICD-10-CM

## 2021-03-05 DIAGNOSIS — R262 Difficulty in walking, not elsewhere classified: Secondary | ICD-10-CM

## 2021-03-06 NOTE — Therapy (Signed)
[place] MAIN [facility] SERVICES [address] [place], [place], [address] Phone: [phone]   Fax:  [phone]  Physical Therapy Treatment  Patient Details  Name: Kendra Benton MRN: [id number] Date of Birth: 04/25/1941 Referring Provider (PT): Dr. Sharia Reeve   Encounter Date: 03/05/2021   PT End of Session - 03/05/21 1646     Visit Number 38    Number of Visits 50    Date for PT Re-Evaluation 03/21/21    Authorization Type Traditional Medicare    Authorization Time Period 07/17/2020- 10/09/2020; 10/24/2020-01/16/2021; PN on 11/11/3327; recert 10/25/8414-60/63/0160    PT Start Time 1255    PT Stop Time 1340    PT Time Calculation (min) 45 min    Activity Tolerance Patient tolerated treatment well;No increased pain    Behavior During Therapy WFL for tasks assessed/performed             Past Medical History:  Diagnosis Date   Allergy    Anxiety    GERD (gastroesophageal reflux disease)    Headache    History of kidney stones    Hypertension    Myocardial infarction [facility])    1997   Osteoporosis    Pneumonia     Past Surgical History:  Procedure Laterality Date   ABDOMINAL HYSTERECTOMY     patient has ovaries   APPENDECTOMY     ARTERY BIOPSY Right 12/30/2017   Procedure: BIOPSY TEMPORAL ARTERY;  Surgeon: Katha Cabal, MD;  Location: [facility] ORS;  Service: Vascular;  Laterality: Right;   ARTERY BIOPSY Left 06/18/2018   Procedure: BIOPSY TEMPORAL ARTERY;  Surgeon: Katha Cabal, MD;  Location: [facility] ORS;  Service: Vascular;  Laterality: Left;   BACK SURGERY     CARDIAC CATHETERIZATION     CATARACT EXTRACTION W/ INTRAOCULAR LENS  IMPLANT, BILATERAL Bilateral 2010   CHOLECYSTECTOMY N/A 02/28/2016   Procedure: LAPAROSCOPIC CHOLECYSTECTOMY WITH INTRAOPERATIVE CHOLANGIOGRAM;  Surgeon: Dia Crawford III, MD;  Location: [facility] ORS;  Service: General;  Laterality: N/A;   LUMBAR FUSION  11/09/2016   L3-L5   SPINE SURGERY     Disc    TONSILLECTOMY      There were no vitals filed for this visit.   Subjective Assessment - 03/05/21 1300     Subjective Patient reports her low back pain has just been really rough lately    Pertinent History Patient reports history of chronic LBP with past lumbar fusion surgery on 11/09/2016 and most recent lumbar facet joint block procedure on 06/19/2020. She reports past medical history of spondylosis, Lung Cancer, HTN. She reports increased difficulty performing ADL's yet still independent but has paid services who assist with cleaning.    Currently in Pain? Yes    Pain Score 6     Pain Location Back    Pain Orientation Posterior;Lower    Pain Descriptors / Indicators Aching    Pain Type Chronic pain    Pain Onset More than a month ago    Pain Frequency Constant    Aggravating Factors  Transfers, Prolonged Standing/bending/lifting             INTERVENTIONS:    INTERVENTION THIS DATE:  Manual Therapy:  -hooklying on moist heat low back while performing stretching.  -myofascial release to bilat QL, errectorspinae (L2/3 level), gluteus medius, good reduction in spasm and pain -Bilat hip inferior glide mobilization in flexion and horizontal abduction 5x30sec Grade II-III bilateral. Manual stretching to lumbar/hips: single knee to chest, Long axis  LE distraction, Lower trunk rotation x 15 min total. Patient presents with mild increased tightness along right lumbar paraspinal region.  Application of biofreeze to low back after manual stretching.   Therapeutic exercises:   Instructed in seated QL stretch x 20 sec hold Right and then left x 3 each.   Instructed in lumbar flex using blue theraball in seated position - forward neutral then the forward/left and then to right x 2 sets each at 20 sec.    Clinical Impression: Patient reports some relief with low back symptoms rating at a 2/10. She was able to follow all VC and visual demo for lumbar stetches and reported feeling a "good  stretch" Patient is lacking any long term relief with pain yet understands goals of PT for pain reduction and management. Pt will benefit from further skilled PT to improve BLE strength, balance and functional mobility.                            PT Education - 03/06/21 1645     Education Details Exercise technique    Person(s) Educated Patient    Methods Explanation;Demonstration;Tactile cues;Verbal cues    Comprehension Verbalized understanding;Returned demonstration;Verbal cues required;Need further instruction              PT Short Term Goals - 10/24/20 1456       PT SHORT TERM GOAL #1   Title Patient will be independent in home exercise program to improve pain relief/ strength/mobility for better functional independence with ADLs.    Baseline 07/17/2020- Patient doesn't have any formal HEP in place. 10/24/2020- Patient reports compliance with current HEP for Low back stretching, core and LE strengthening.    Time 6    Period Weeks    Status Achieved    Target Date 08/28/20               PT Long Term Goals - 01/24/21 0818       PT LONG TERM GOAL #1   Title Patient will increase FOTO score to equal to or greater than 63 to demonstrate statistically significant improvement in mobility and quality of life.    Baseline 07/17/2020: FOTO score=60; 10/24/2020= 40; 01/09/2021= 60. 01/24/2021- Did not update since just administered on 01/09/2021    Time 8    Period Weeks    Status On-going    Target Date 03/21/21      PT LONG TERM GOAL #2   Title Pt will decrease worst back pain as reported on NPRS by at least 2 points in order to demonstrate clinically significant reduction in back pain.    Baseline 07/17/2020- 5/10 LBP at worst; 08/29/20: reaches around a 7/10 almost daily in the past week. 10/24/2020= Patient rates at a 3/10 current and up to 6/10 at worst. 01/09/2021-Patient reports ongoing LBP (right sided today) rates at a 5/10. 01/24/2021= Patient reports  LBP around 4/10    Time 8    Period Weeks    Status On-going    Target Date 03/21/21      PT LONG TERM GOAL #3   Title Patient (> 52 years old) will complete five times sit to stand test in <13 seconds indicating an increased LE strength and improved balance.   Previously was 18sec goal   Baseline 07/17/2020- 26.02 sec; 08/29/20: 14.34sec  10/24/2020 = 22.45 sec; 01/09/2021= 14.84 sec. 01/24/2021=15.0 sec without UE support    Time 12    Period  Weeks    Status On-going      PT LONG TERM GOAL #4   Title Patient will increase Berg Balance score by > 4 points to demonstrate decreased fall risk during functional activities.   Will update to either a SLS goal or a DGI/FGA goal next session.   Baseline 07/17/2020- BERG = 49/56; 08/29/20: 50/56 (will convert to new balance goal, as pt cannot improve any more (statistically) due to ceiling effect and medical restrictions)    Time 12    Period Weeks    Status Revised      PT LONG TERM GOAL #5   Title Patient will increase 10 meter walk test to >1.79m/s as to improve gait speed for better community ambulation and to reduce fall risk.    Baseline 07/17/2020= 10MWT avg=11.0 sec (0.91 m/s) without an AD; 08/29/20: 1.32m/s self selected, 1.87m/s;    Time 12    Period Weeks    Status Achieved      Additional Long Term Goals   Additional Long Term Goals Yes      PT LONG TERM GOAL #6   Title Patient will increase Functional Gait Assessment score to >23/30 as to reduce fall risk and improve dynamic gait safety with community ambulation.    Baseline 10/24/2020= FGA score of 20/30; 01/09/2021= 27/30  (will keep goal active to ensure patient remains compliant). 01/24/2021=28/30    Time 12    Period Weeks    Status Achieved      PT LONG TERM GOAL #7   Title Patient will ascend/descend 4 steps with 1 rail while holding a bag (ex. grocery) - demo reciprocal steps with mod independence for improved independence and step negotiation    Baseline 01/24/2021- Patient  requires step to gait and railing and unable to hold a bag of grocery when stepping up    Time 8    Period Weeks    Status New    Target Date 03/21/21      PT LONG TERM GOAL #8   Title Patient will exhibit proper body mechanics with household activities to reduce risk of injury to spine.    Baseline 01/24/2021: Patient admits to decreased knowledge of safe and proper body mechanics with performing housework.    Time 8    Period Weeks    Status New    Target Date 03/21/21                   Plan - 03/05/21 1646     Clinical Impression Statement Patient reports some relief with low back symptoms rating at a 2/10. She was able to follow all VC and visual demo for lumbar stetches and reported feeling a "good stretch" Patient is lacking any long term relief with pain yet understands goals of PT for pain reduction and management. Pt will benefit from further skilled PT to improve BLE strength, balance and functional mobility.    Personal Factors and Comorbidities Age;Comorbidity 3+    Comorbidities Osteoporosis, HTN, CHF    Examination-Activity Limitations Bend;Caring for Others;Carry;Lift;Reach Overhead;Sit;Squat;Stairs;Stand    Examination-Participation Restrictions Cleaning;Community Activity;Laundry;Shop;Yard Work    [employer]chant [employer] officer Evolving/Moderate complexity    Rehab Potential Fair    PT Frequency 2x / week    PT Treatment/Interventions ADLs/Self Care Home Management;Cryotherapy;Moist Heat;DME Instruction;Stair training;Functional mobility training;Therapeutic activities;Therapeutic exercise;Balance training;Neuromuscular re-education;Patient/family education;Manual techniques    PT Next Visit Plan Manual therapy/PROM for low back symptoms, balance/core/LE strengthening and training for improved mobility and function.  PT Home Exercise Plan No updates    Consulted and Agree with Plan of Care Patient             Patient will benefit from skilled  therapeutic intervention in order to improve the following deficits and impairments:  Abnormal gait, Decreased balance, Decreased coordination, Decreased activity tolerance, Cardiopulmonary status limiting activity, Decreased endurance, Decreased mobility, Decreased strength, Difficulty walking, Pain  Visit Diagnosis: Abnormality of gait and mobility  Difficulty in walking, not elsewhere classified  Muscle weakness (generalized)  Unsteadiness on feet     Problem List Patient Active Problem List   Diagnosis Date Noted   Fall (on) (from) other stairs and steps, initial encounter 08/06/2020   Lumbar facet joint syndrome 06/18/2020   Spondylosis without myelopathy or radiculopathy, lumbosacral region 06/18/2020   DDD (degenerative disc disease), lumbosacral 06/18/2020   COVID 06/04/2020   Chronic pain syndrome 05/16/2020   Pharmacologic therapy 05/16/2020   Disorder of skeletal system 05/16/2020   Problems influencing health status 05/16/2020   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   Chronic hip pain (2ry area of Pain) (Bilateral) (R>L) 05/16/2020   Chronic lower extremity pain (Left) 05/16/2020   Numbness and tingling of both feet (intermittent/recurrent) 05/16/2020   Pain in rib (Bilateral) (R>L) (intermittent) 05/16/2020   Chronic knee pain (Left) 05/16/2020   Patellar pain (Left) (chronic, intermittent) 05/16/2020   Hard of hearing 05/16/2020   Carcinoma, lung, right ([place]) 06/25/2019   S/P partial lobectomy of lung 02/25/2019   Mass of upper lobe of right lung 01/06/2019   Chronic nonintractable headache 06/14/2018   Head ache 12/03/2017   Sinus bradycardia 10/06/2017   Chronic low back pain (1ry area of Pain) (Bilateral) (L>R) w/o sciatica 08/17/2017   Panic attack as reaction to stress 08/17/2017   Anxiety 06/15/2017   Atypical chest pain 06/15/2017   Hyperlipidemia 06/15/2017   Cholecystitis with cholelithiasis 02/27/2016   Increased frequency of urination  01/27/2015   History of night sweats 05/01/2014   Dupuytren's contracture 07/18/2013   Inverted nipple 04/18/2013   Depression 04/23/2010   Fibromyalgia 04/23/2010   Hypercholesterolemia 04/23/2010   Essential hypertension 04/23/2010   Hypothyroidism 04/23/2010   Irritable colon 04/23/2010   Osteoarthritis 04/23/2010    Lewis Moccasin, PT 03/06/2021, 5:12 PM  [place] MAIN [facility] SERVICES [address] [place], [place], [address] Phone: [phone]   Fax:  [phone]  Name: Kendra Benton MRN: [id number] Date of Birth: [birth year]

## 2021-03-12 ENCOUNTER — Ambulatory Visit: Payer: Medicare Other | Attending: Internal Medicine

## 2021-03-12 ENCOUNTER — Other Ambulatory Visit: Payer: Self-pay

## 2021-03-12 DIAGNOSIS — M6281 Muscle weakness (generalized): Secondary | ICD-10-CM | POA: Diagnosis present

## 2021-03-12 DIAGNOSIS — R269 Unspecified abnormalities of gait and mobility: Secondary | ICD-10-CM | POA: Diagnosis not present

## 2021-03-12 DIAGNOSIS — R2681 Unsteadiness on feet: Secondary | ICD-10-CM | POA: Insufficient documentation

## 2021-03-12 DIAGNOSIS — R262 Difficulty in walking, not elsewhere classified: Secondary | ICD-10-CM | POA: Insufficient documentation

## 2021-03-12 NOTE — Therapy (Signed)
[place] MAIN [facility] SERVICES [address] [place], [place], [address] Phone: [phone]   Fax:  [phone]  Physical Therapy Treatment  Patient Details  Name: Kendra Benton MRN: [id number] Date of Birth: [birth year] Referring Provider (PT): Dr. Sharia Reeve   Encounter Date: 03/12/2021   PT End of Session - 03/12/21 1321     Visit Number 39    Number of Visits 50    Date for PT Re-Evaluation 03/21/21    Authorization Type Traditional Medicare    Authorization Time Period 07/17/2020- 10/09/2020; 10/24/2020-01/16/2021; PN on 0/01/1447; recert 1/85/6314-97/07/6376    PT Start Time 1308    PT Stop Time 1344    PT Time Calculation (min) 36 min    Activity Tolerance Patient tolerated treatment well;No increased pain    Behavior During Therapy WFL for tasks assessed/performed             Past Medical History:  Diagnosis Date   Allergy    Anxiety    GERD (gastroesophageal reflux disease)    Headache    History of kidney stones    Hypertension    Myocardial infarction [facility])    1997   Osteoporosis    Pneumonia     Past Surgical History:  Procedure Laterality Date   ABDOMINAL HYSTERECTOMY     patient has ovaries   APPENDECTOMY     ARTERY BIOPSY Right 12/30/2017   Procedure: BIOPSY TEMPORAL ARTERY;  Surgeon: Katha Cabal, MD;  Location: [facility] ORS;  Service: Vascular;  Laterality: Right;   ARTERY BIOPSY Left 06/18/2018   Procedure: BIOPSY TEMPORAL ARTERY;  Surgeon: Katha Cabal, MD;  Location: [facility] ORS;  Service: Vascular;  Laterality: Left;   BACK SURGERY     CARDIAC CATHETERIZATION     CATARACT EXTRACTION W/ INTRAOCULAR LENS  IMPLANT, BILATERAL Bilateral 2010   CHOLECYSTECTOMY N/A 02/28/2016   Procedure: LAPAROSCOPIC CHOLECYSTECTOMY WITH INTRAOPERATIVE CHOLANGIOGRAM;  Surgeon: Dia Crawford III, MD;  Location: [facility] ORS;  Service: General;  Laterality: N/A;   LUMBAR FUSION  11/09/2016   L3-L5   SPINE SURGERY     Disc    TONSILLECTOMY      There were no vitals filed for this visit.   Subjective Assessment - 03/12/21 1319     Subjective Patient reports some anterior right lower quadrant pain and ongoing left sided low back pain.    Pertinent History Patient reports history of chronic LBP with past lumbar fusion surgery on 11/09/2016 and most recent lumbar facet joint block procedure on 06/19/2020. She reports past medical history of spondylosis, Lung Cancer, HTN. She reports increased difficulty performing ADL's yet still independent but has paid services who assist with cleaning.    Currently in Pain? Yes    Pain Score 4     Pain Location Back    Pain Orientation Posterior;Lower    Pain Descriptors / Indicators Aching    Pain Type Chronic pain    Pain Onset More than a month ago    Pain Frequency Constant            INTERVENTIONS:      INTERVENTION THIS DATE:   Manual Therapy:  -hooklying on moist heat low back while patient performed self stretching today- Lower trunk rotation, Single leg piriformis stretch.   Reviewed static bridge hold - 20-30 sec (no pain reported)    -Bilat hip inferior glide mobilization in flexion and horizontal abduction 5x30sec Grade II-III bilateral. Muscle energy technique- iso knee to  chest x 5 sec hold x 5 each leg. Iso hip ext x 5 sec hold x 5 (no pain)    Therapeutic exercises:    Reviewed seated QL stretch x 20 sec hold Right and then left x 3 each.   Reviewed standing QL stretch x 20 sec hold Right and then Left x 3 each.    Reviewed lumbar flex using silver theraball in seated position - forward neutral then the forward/left and then to right x 2 sets each at 20 sec.      Clinical Impression: Patient performed well overall today without any increased pain. She has remained motivated to attempt manage her pain through exercise and stretching. She is doing well with self stretching and less VC overall with therex today. Pt will benefit from further skilled  PT to improve BLE strength, balance and functional mobility.                             PT Education - 03/12/21 1321     Education Details Management of chronic low back- Patient    Person(s) Educated Patient    Methods Explanation;Demonstration;Tactile cues;Verbal cues    Comprehension Verbalized understanding;Returned demonstration;Verbal cues required;Tactile cues required;Need further instruction              PT Short Term Goals - 10/24/20 1456       PT SHORT TERM GOAL #1   Title Patient will be independent in home exercise program to improve pain relief/ strength/mobility for better functional independence with ADLs.    Baseline 07/17/2020- Patient doesn't have any formal HEP in place. 10/24/2020- Patient reports compliance with current HEP for Low back stretching, core and LE strengthening.    Time 6    Period Weeks    Status Achieved    Target Date 08/28/20               PT Long Term Goals - 01/24/21 0818       PT LONG TERM GOAL #1   Title Patient will increase FOTO score to equal to or greater than 63 to demonstrate statistically significant improvement in mobility and quality of life.    Baseline 07/17/2020: FOTO score=60; 10/24/2020= 40; 01/09/2021= 60. 01/24/2021- Did not update since just administered on 01/09/2021    Time 8    Period Weeks    Status On-going    Target Date 03/21/21      PT LONG TERM GOAL #2   Title Pt will decrease worst back pain as reported on NPRS by at least 2 points in order to demonstrate clinically significant reduction in back pain.    Baseline 07/17/2020- 5/10 LBP at worst; 08/29/20: reaches around a 7/10 almost daily in the past week. 10/24/2020= Patient rates at a 3/10 current and up to 6/10 at worst. 01/09/2021-Patient reports ongoing LBP (right sided today) rates at a 5/10. 01/24/2021= Patient reports LBP around 4/10    Time 8    Period Weeks    Status On-going    Target Date 03/21/21      PT LONG TERM GOAL  #3   Title Patient (> 54 years old) will complete five times sit to stand test in <13 seconds indicating an increased LE strength and improved balance.   Previously was 18sec goal   Baseline 07/17/2020- 26.02 sec; 08/29/20: 14.34sec  10/24/2020 = 22.45 sec; 01/09/2021= 14.84 sec. 01/24/2021=15.0 sec without UE support    Time 12  Period Weeks    Status On-going      PT LONG TERM GOAL #4   Title Patient will increase Berg Balance score by > 4 points to demonstrate decreased fall risk during functional activities.   Will update to either a SLS goal or a DGI/FGA goal next session.   Baseline 07/17/2020- BERG = 49/56; 08/29/20: 50/56 (will convert to new balance goal, as pt cannot improve any more (statistically) due to ceiling effect and medical restrictions)    Time 12    Period Weeks    Status Revised      PT LONG TERM GOAL #5   Title Patient will increase 10 meter walk test to >1.61m/s as to improve gait speed for better community ambulation and to reduce fall risk.    Baseline 07/17/2020= 10MWT avg=11.0 sec (0.91 m/s) without an AD; 08/29/20: 1.72m/s self selected, 1.64m/s;    Time 12    Period Weeks    Status Achieved      Additional Long Term Goals   Additional Long Term Goals Yes      PT LONG TERM GOAL #6   Title Patient will increase Functional Gait Assessment score to >23/30 as to reduce fall risk and improve dynamic gait safety with community ambulation.    Baseline 10/24/2020= FGA score of 20/30; 01/09/2021= 27/30  (will keep goal active to ensure patient remains compliant). 01/24/2021=28/30    Time 12    Period Weeks    Status Achieved      PT LONG TERM GOAL #7   Title Patient will ascend/descend 4 steps with 1 rail while holding a bag (ex. grocery) - demo reciprocal steps with mod independence for improved independence and step negotiation    Baseline 01/24/2021- Patient requires step to gait and railing and unable to hold a bag of grocery when stepping up    Time 8    Period Weeks     Status New    Target Date 03/21/21      PT LONG TERM GOAL #8   Title Patient will exhibit proper body mechanics with household activities to reduce risk of injury to spine.    Baseline 01/24/2021: Patient admits to decreased knowledge of safe and proper body mechanics with performing housework.    Time 8    Period Weeks    Status New    Target Date 03/21/21                   Plan - 03/12/21 1324     Clinical Impression Statement Patient performed well overall today without any increased pain. She has remained motivated to attempt manage her pain through exercise and stretching. She is doing well with self stretching and less VC overall with therex today. Pt will benefit from further skilled PT to improve BLE strength, balance and functional mobility    Personal Factors and Comorbidities Age;Comorbidity 3+    Comorbidities Osteoporosis, HTN, CHF    Examination-Activity Limitations Bend;Caring for Others;Carry;Lift;Reach Overhead;Sit;Squat;Stairs;Stand    Examination-Participation Restrictions Cleaning;Community Activity;Laundry;Shop;Yard Work    [employer]chant [employer] officer Evolving/Moderate complexity    Rehab Potential Fair    PT Frequency 2x / week    PT Treatment/Interventions ADLs/Self Care Home Management;Cryotherapy;Moist Heat;DME Instruction;Stair training;Functional mobility training;Therapeutic activities;Therapeutic exercise;Balance training;Neuromuscular re-education;Patient/family education;Manual techniques    PT Next Visit Plan Manual therapy/PROM for low back symptoms, balance/core/LE strengthening and training for improved mobility and function.    PT Home Exercise Plan No updates    Consulted and Agree  with Plan of Care Patient             Patient will benefit from skilled therapeutic intervention in order to improve the following deficits and impairments:  Abnormal gait, Decreased balance, Decreased coordination, Decreased activity tolerance,  Cardiopulmonary status limiting activity, Decreased endurance, Decreased mobility, Decreased strength, Difficulty walking, Pain  Visit Diagnosis: Abnormality of gait and mobility  Difficulty in walking, not elsewhere classified  Muscle weakness (generalized)  Unsteadiness on feet     Problem List Patient Active Problem List   Diagnosis Date Noted   Fall (on) (from) other stairs and steps, initial encounter 08/06/2020   Lumbar facet joint syndrome 06/18/2020   Spondylosis without myelopathy or radiculopathy, lumbosacral region 06/18/2020   DDD (degenerative disc disease), lumbosacral 06/18/2020   COVID 06/04/2020   Chronic pain syndrome 05/16/2020   Pharmacologic therapy 05/16/2020   Disorder of skeletal system 05/16/2020   Problems influencing health status 05/16/2020   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   Chronic hip pain (2ry area of Pain) (Bilateral) (R>L) 05/16/2020   Chronic lower extremity pain (Left) 05/16/2020   Numbness and tingling of both feet (intermittent/recurrent) 05/16/2020   Pain in rib (Bilateral) (R>L) (intermittent) 05/16/2020   Chronic knee pain (Left) 05/16/2020   Patellar pain (Left) (chronic, intermittent) 05/16/2020   Hard of hearing 05/16/2020   Carcinoma, lung, right ([place]) 06/25/2019   S/P partial lobectomy of lung 02/25/2019   Mass of upper lobe of right lung 01/06/2019   Chronic nonintractable headache 06/14/2018   Head ache 12/03/2017   Sinus bradycardia 10/06/2017   Chronic low back pain (1ry area of Pain) (Bilateral) (L>R) w/o sciatica 08/17/2017   Panic attack as reaction to stress 08/17/2017   Anxiety 06/15/2017   Atypical chest pain 06/15/2017   Hyperlipidemia 06/15/2017   Cholecystitis with cholelithiasis 02/27/2016   Increased frequency of urination 01/27/2015   History of night sweats 05/01/2014   Dupuytren's contracture 07/18/2013   Inverted nipple 04/18/2013   Depression 04/23/2010   Fibromyalgia 04/23/2010    Hypercholesterolemia 04/23/2010   Essential hypertension 04/23/2010   Hypothyroidism 04/23/2010   Irritable colon 04/23/2010   Osteoarthritis 04/23/2010    Lewis Moccasin, PT 03/12/2021, 5:53 PM  [place] MAIN [facility] SERVICES [address] [place], [place], [address] Phone: [phone]   Fax:  [phone]  Name: DAWNYA GRAMS MRN: [id number] Date of Birth: 1940/07/27

## 2021-03-19 ENCOUNTER — Other Ambulatory Visit: Payer: Self-pay

## 2021-03-19 ENCOUNTER — Ambulatory Visit: Payer: Medicare Other

## 2021-03-19 DIAGNOSIS — R269 Unspecified abnormalities of gait and mobility: Secondary | ICD-10-CM | POA: Diagnosis not present

## 2021-03-19 DIAGNOSIS — M6281 Muscle weakness (generalized): Secondary | ICD-10-CM

## 2021-03-19 DIAGNOSIS — R2681 Unsteadiness on feet: Secondary | ICD-10-CM

## 2021-03-19 DIAGNOSIS — R262 Difficulty in walking, not elsewhere classified: Secondary | ICD-10-CM

## 2021-03-19 NOTE — Therapy (Signed)
[place] MAIN [facility] SERVICES [place], [place], [address] Phone: [phone]   Fax:  [phone]  Physical Therapy Treatment/Physical Therapy Progress Note   Dates of reporting period 01/09/2021  to   03/19/2021  Patient Details  Name: Kendra Benton MRN: [id number] Date of Birth: [birth year] Referring Provider (PT): Dr. Sharia Reeve   Encounter Date: 03/19/2021   PT End of Session - 03/19/21 1434     Visit Number 40    Number of Visits 50    Date for PT Re-Evaluation 03/21/21    Authorization Type Traditional Medicare    Authorization Time Period 07/17/2020- 10/09/2020; 10/24/2020-01/16/2021; PN on 11/14/1273; recert 1/70/0174-94/49/6759    PT Start Time 1417    PT Stop Time 1459    PT Time Calculation (min) 42 min    Equipment Utilized During Treatment Gait belt    Activity Tolerance Patient tolerated treatment well;No increased pain    Behavior During Therapy WFL for tasks assessed/performed             Past Medical History:  Diagnosis Date   Allergy    Anxiety    GERD (gastroesophageal reflux disease)    Headache    History of kidney stones    Hypertension    Myocardial infarction [facility])    1997   Osteoporosis    Pneumonia     Past Surgical History:  Procedure Laterality Date   ABDOMINAL HYSTERECTOMY     patient has ovaries   APPENDECTOMY     ARTERY BIOPSY Right 12/30/2017   Procedure: BIOPSY TEMPORAL ARTERY;  Surgeon: Katha Cabal, MD;  Location: [facility] ORS;  Service: Vascular;  Laterality: Right;   ARTERY BIOPSY Left 06/18/2018   Procedure: BIOPSY TEMPORAL ARTERY;  Surgeon: Katha Cabal, MD;  Location: [facility] ORS;  Service: Vascular;  Laterality: Left;   BACK SURGERY     CARDIAC CATHETERIZATION     CATARACT EXTRACTION W/ INTRAOCULAR LENS  IMPLANT, BILATERAL Bilateral 2010   CHOLECYSTECTOMY N/A 02/28/2016   Procedure: LAPAROSCOPIC CHOLECYSTECTOMY WITH INTRAOPERATIVE CHOLANGIOGRAM;  Surgeon: Dia Crawford  III, MD;  Location: [facility] ORS;  Service: General;  Laterality: N/A;   LUMBAR FUSION  11/09/2016   L3-L5   SPINE SURGERY     Disc   TONSILLECTOMY      There were no vitals filed for this visit.   Subjective Assessment - 03/19/21 1427     Subjective Patient reports having some improvement overall with less back pain so far this week- Rates at a 4/10.    Pertinent History Patient reports history of chronic LBP with past lumbar fusion surgery on 11/09/2016 and most recent lumbar facet joint block procedure on 06/19/2020. She reports past medical history of spondylosis, Lung Cancer, HTN. She reports increased difficulty performing ADL's yet still independent but has paid services who assist with cleaning.    Currently in Pain? Yes    Pain Score 4     Pain Location Back    Pain Orientation Right;Left;Posterior;Lower    Pain Descriptors / Indicators Aching    Pain Type Chronic pain    Pain Onset More than a month ago    Pain Frequency Constant    Aggravating Factors  Getting up and down, prolonged standing and bending/lifting.    Pain Relieving Factors Rest, Meds    Effect of Pain on Daily Activities Difficulty with mobility, ADL's and housework              INTERVENTIONS:  Reassessed some of LTGs today-  Pain= 4/10 (center low back pain) - patient with good understanding of how to manage chronic low back pain- including use of heat, ice, meds, position changes, yoga, and painfree exercises for core, back, and LE Strengthening.   Discussed step goal- Patient reports she is no longer having issues going up and down steps as long as she is using a rail - able to hold onto grocery bag(s) without report of loss of balance or significant difficulty.   Treatment focused on education in body mechanics while performing household activities including sweep, mop, unload/load dishwasher, putting dishes away, moving around in kitchen holding objects close to chest- golfer lean or squat as appropriate  lifting items from low surface.   Therapeutic exercises:   Patient instructed in use of small green theraball in hooklye position- Knee to chest, Lower trunk rotation, piriformis and later bridging (15 reps) and stretching hold at 20 sec x 4 sets each. Patient able to perform all activities requiring VC and Visual demo for technique.   Clinical Impression: Patient performed well with addition of theraball for stretching, core exercises today without report of increased pain. She is making progress with decreased pain report overall since May 2022 and now able to negotiate steps including carrying groceries up steps. Patient was also able to go through some Arts development officer safety with education on safe lifting and moving around in kitchen today. She will benefit from review next session. Patient's condition has the potential to improve in response to therapy. Maximum improvement is yet to be obtained. The anticipated improvement is attainable and reasonable in a generally predictable time.                            PT Education - 03/19/21 1431     Education Details Exercise technique with theraball    Person(s) Educated Patient    Methods Explanation;Demonstration;Tactile cues;Verbal cues    Comprehension Returned demonstration;Verbalized understanding;Verbal cues required;Need further instruction              PT Short Term Goals - 10/24/20 1456       PT SHORT TERM GOAL #1   Title Patient will be independent in home exercise program to improve pain relief/ strength/mobility for better functional independence with ADLs.    Baseline 07/17/2020- Patient doesn't have any formal HEP in place. 10/24/2020- Patient reports compliance with current HEP for Low back stretching, core and LE strengthening.    Time 6    Period Weeks    Status Achieved    Target Date 08/28/20               PT Long Term Goals - 03/19/21 1440       PT LONG TERM GOAL #1   Title Patient will  increase FOTO score to equal to or greater than 63 to demonstrate statistically significant improvement in mobility and quality of life.    Baseline 07/17/2020: FOTO score=60; 10/24/2020= 40; 01/09/2021= 60. 01/24/2021- Did not update since just administered on 01/09/2021    Time 8    Period Weeks    Status On-going    Target Date 03/21/21      PT LONG TERM GOAL #2   Title Pt will decrease worst back pain as reported on NPRS by at least 2 points in order to demonstrate clinically significant reduction in back pain.    Baseline 07/17/2020- 5/10 LBP at worst; 08/29/20: reaches around  a 7/10 almost daily in the past week. 10/24/2020= Patient rates at a 3/10 current and up to 6/10 at worst. 01/09/2021-Patient reports ongoing LBP (right sided today) rates at a 5/10. 01/24/2021= Patient reports LBP around 4/10; 03/19/2021= 4/10 (center low back pain)    Time 8    Period Weeks    Status On-going    Target Date 03/21/21      PT LONG TERM GOAL #3   Title Patient (> 40 years old) will complete five times sit to stand test in <13 seconds indicating an increased LE strength and improved balance.   Previously was 18sec goal   Baseline 07/17/2020- 26.02 sec; 08/29/20: 14.34sec  10/24/2020 = 22.45 sec; 01/09/2021= 14.84 sec. 01/24/2021=15.0 sec without UE support    Time 12    Period Weeks    Status On-going      PT LONG TERM GOAL #4   Title Patient will increase Berg Balance score by > 4 points to demonstrate decreased fall risk during functional activities.   Will update to either a SLS goal or a DGI/FGA goal next session.   Baseline 07/17/2020- BERG = 49/56; 08/29/20: 50/56 (will convert to new balance goal, as pt cannot improve any more (statistically) due to ceiling effect and medical restrictions)    Time 12    Period Weeks    Status Revised      PT LONG TERM GOAL #5   Title Patient will increase 10 meter walk test to >1.76m/s as to improve gait speed for better community ambulation and to reduce fall risk.     Baseline 07/17/2020= 10MWT avg=11.0 sec (0.91 m/s) without an AD; 08/29/20: 1.63m/s self selected, 1.91m/s;    Time 12    Period Weeks    Status Achieved      PT LONG TERM GOAL #6   Title Patient will increase Functional Gait Assessment score to >23/30 as to reduce fall risk and improve dynamic gait safety with community ambulation.    Baseline 10/24/2020= FGA score of 20/30; 01/09/2021= 27/30  (will keep goal active to ensure patient remains compliant). 01/24/2021=28/30    Time 12    Period Weeks    Status Achieved      PT LONG TERM GOAL #7   Title Patient will ascend/descend 4 steps with 1 rail while holding a bag (ex. grocery) - demo reciprocal steps with mod independence for improved independence and step negotiation    Baseline 01/24/2021- Patient requires step to gait and railing and unable to hold a bag of grocery when stepping up. 03/19/2021- Patient reports that she has progressed and now states no difficulty holding onto bag of grocery and railing and going up/down steps.    Time 8    Period Weeks    Status Achieved      PT LONG TERM GOAL #8   Title Patient will exhibit proper body mechanics with household activities to reduce risk of injury to spine.    Baseline 01/24/2021: Patient admits to decreased knowledge of safe and proper body mechanics with performing housework. 03/19/2021- Treatment focused on education in body mechanics while performing household activities including sweep, mop, unload/load dishwasher, putting dishes away, moving around in kitchen holding objects close to chest- golfer lean or squat as appropriate lifting items from low surface. will reassess next visit    Time 8    Period Weeks    Status On-going    Target Date 03/21/21  Plan - 03/19/21 1436     Clinical Impression Statement Patient performed well with addition of theraball for stretching, core exercises today without report of increased pain. She is making progress with  decreased pain report overall since May 2022 and now able to negotiate steps including carrying groceries up steps. Patient was also able to go through some Arts development officer safety with education on safe lifting and moving around in kitchen today. She will benefit from review next session. Patient's condition has the potential to improve in response to therapy. Maximum improvement is yet to be obtained. The anticipated improvement is attainable and reasonable in a generally predictable time.    Personal Factors and Comorbidities Age;Comorbidity 3+    Comorbidities Osteoporosis, HTN, CHF    Examination-Activity Limitations Bend;Caring for Others;Carry;Lift;Reach Overhead;Sit;Squat;Stairs;Stand    Examination-Participation Restrictions Cleaning;Community Activity;Laundry;Shop;Yard Work    Merchant navy officer Evolving/Moderate complexity    Rehab Potential Fair    PT Frequency 2x / week    PT Treatment/Interventions ADLs/Self Care Home Management;Cryotherapy;Moist Heat;DME Instruction;Stair training;Functional mobility training;Therapeutic activities;Therapeutic exercise;Balance training;Neuromuscular re-education;Patient/family education;Manual techniques    PT Next Visit Plan Manual therapy/PROM for low back symptoms, balance/core/LE strengthening and training for improved mobility and function.    PT Home Exercise Plan No updates    Consulted and Agree with Plan of Care Patient             Patient will benefit from skilled therapeutic intervention in order to improve the following deficits and impairments:  Abnormal gait, Decreased balance, Decreased coordination, Decreased activity tolerance, Cardiopulmonary status limiting activity, Decreased endurance, Decreased mobility, Decreased strength, Difficulty walking, Pain  Visit Diagnosis: Abnormality of gait and mobility  Difficulty in walking, not elsewhere classified  Muscle weakness (generalized)  Unsteadiness on  feet     Problem List Patient Active Problem List   Diagnosis Date Noted   Fall (on) (from) other stairs and steps, initial encounter 08/06/2020   Lumbar facet joint syndrome 06/18/2020   Spondylosis without myelopathy or radiculopathy, lumbosacral region 06/18/2020   DDD (degenerative disc disease), lumbosacral 06/18/2020   COVID 06/04/2020   Chronic pain syndrome 05/16/2020   Pharmacologic therapy 05/16/2020   Disorder of skeletal system 05/16/2020   Problems influencing health status 05/16/2020   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   Chronic hip pain (2ry area of Pain) (Bilateral) (R>L) 05/16/2020   Chronic lower extremity pain (Left) 05/16/2020   Numbness and tingling of both feet (intermittent/recurrent) 05/16/2020   Pain in rib (Bilateral) (R>L) (intermittent) 05/16/2020   Chronic knee pain (Left) 05/16/2020   Patellar pain (Left) (chronic, intermittent) 05/16/2020   Hard of hearing 05/16/2020   Carcinoma, lung, right ([place]) 06/25/2019   S/P partial lobectomy of lung 02/25/2019   Mass of upper lobe of right lung 01/06/2019   Chronic nonintractable headache 06/14/2018   Head ache 12/03/2017   Sinus bradycardia 10/06/2017   Chronic low back pain (1ry area of Pain) (Bilateral) (L>R) w/o sciatica 08/17/2017   Panic attack as reaction to stress 08/17/2017   Anxiety 06/15/2017   Atypical chest pain 06/15/2017   Hyperlipidemia 06/15/2017   Cholecystitis with cholelithiasis 02/27/2016   Increased frequency of urination 01/27/2015   History of night sweats 05/01/2014   Dupuytren's contracture 07/18/2013   Inverted nipple 04/18/2013   Depression 04/23/2010   Fibromyalgia 04/23/2010   Hypercholesterolemia 04/23/2010   Essential hypertension 04/23/2010   Hypothyroidism 04/23/2010   Irritable colon 04/23/2010   Osteoarthritis 04/23/2010    Lewis Moccasin, PT 03/20/2021, 9:22 AM  [place] MAIN [facility] SERVICES [address] [place], [place], [address] Phone: [phone]   Fax:  [phone]  Name: Kendra Benton MRN: [id number] Date of Birth: Sep 18, 1940

## 2021-03-26 ENCOUNTER — Ambulatory Visit: Payer: Medicare Other

## 2021-04-02 ENCOUNTER — Ambulatory Visit: Payer: Medicare Other

## 2021-04-02 ENCOUNTER — Other Ambulatory Visit: Payer: Self-pay

## 2021-04-02 DIAGNOSIS — R262 Difficulty in walking, not elsewhere classified: Secondary | ICD-10-CM

## 2021-04-02 DIAGNOSIS — R269 Unspecified abnormalities of gait and mobility: Secondary | ICD-10-CM

## 2021-04-02 NOTE — Therapy (Signed)
[place] [facility] REHAB SERVICES [address] [place], [place], [address] Phone: [phone]   Fax:  [phone]  April 02, 2021   No Recipients  Physical Therapy Discharge Summary  Patient: Kendra Benton  MRN: [id number]  Date of Birth: 01/28/1941   Diagnosis: Abnormality of gait and mobility  Difficulty in walking, not elsewhere classified Referring Provider (PT): Dr. Klipstein   The above patient had been seen in Physical Therapy 40 times of  treatments scheduled with 2 no shows and 1 cancellations.  The treatment consisted of Therapeutic exercise consisting of LE/trunk flexibility; core strengthening; LE strengthening and balance training; pain management; body mechanics.  The patient is: Improved- functionally yet continues to be limited by pain.   Subjective: Patient reports doing well overall- walking 45 min daily, No difficulty with ADLs  and going up/down steps. States pain is about the same.   Discharge Findings:  Patient here for non-billable visit due to cancelled visit last week and up for recert/discharge.  Pateint scored a 74 on FOTO and all other goals met except for pain.   Functional Status at Discharge: Patient functioning at an independent level - walking with use of cane - up to 45 min walking in the park.   Goals Partially Met- All goals met except for pain and 5 x SIT TO stand however made progress toward those goals as well.     Sincerely,   Jeffrey N Westbrooks, PT   CC No Recipients  [facility] [address] [place], [place], [address] Phone: [phone]   Fax:  [phone]  Patient: [name]  MRN: [id number]  Date of Birth: 06/30/1940     

## 2021-04-09 ENCOUNTER — Ambulatory Visit: Payer: Medicare Other

## 2021-04-16 ENCOUNTER — Ambulatory Visit: Payer: Medicare Other

## 2021-04-23 ENCOUNTER — Ambulatory Visit: Payer: Medicare Other

## 2021-04-30 ENCOUNTER — Ambulatory Visit: Payer: Medicare Other

## 2021-05-07 ENCOUNTER — Ambulatory Visit: Payer: Medicare Other

## 2021-05-14 ENCOUNTER — Ambulatory Visit: Payer: Medicare Other

## 2021-05-21 ENCOUNTER — Ambulatory Visit: Payer: Medicare Other

## 2021-05-28 ENCOUNTER — Ambulatory Visit: Payer: Medicare Other

## 2021-06-04 ENCOUNTER — Ambulatory Visit: Payer: Medicare Other

## 2021-06-11 ENCOUNTER — Ambulatory Visit: Payer: Medicare Other

## 2021-06-19 ENCOUNTER — Ambulatory Visit: Payer: Medicare Other

## 2021-06-24 ENCOUNTER — Other Ambulatory Visit: Payer: Self-pay

## 2021-06-24 ENCOUNTER — Ambulatory Visit: Payer: Medicare Other | Attending: Internal Medicine

## 2021-06-24 VITALS — BP 152/67 | HR 78 | Ht 62.0 in | Wt 122.0 lb

## 2021-06-24 DIAGNOSIS — M6281 Muscle weakness (generalized): Secondary | ICD-10-CM | POA: Diagnosis present

## 2021-06-24 DIAGNOSIS — R262 Difficulty in walking, not elsewhere classified: Secondary | ICD-10-CM | POA: Diagnosis present

## 2021-06-24 DIAGNOSIS — M545 Low back pain, unspecified: Secondary | ICD-10-CM | POA: Insufficient documentation

## 2021-06-24 DIAGNOSIS — R269 Unspecified abnormalities of gait and mobility: Secondary | ICD-10-CM | POA: Insufficient documentation

## 2021-06-24 DIAGNOSIS — G8929 Other chronic pain: Secondary | ICD-10-CM | POA: Insufficient documentation

## 2021-06-24 DIAGNOSIS — R2681 Unsteadiness on feet: Secondary | ICD-10-CM | POA: Insufficient documentation

## 2021-06-24 NOTE — Therapy (Signed)
[place] MAIN [facility] SERVICES [address] [place], [place], [address] Phone: [phone]   Fax:  [phone]  Physical Therapy Evaluation  Patient Details  Name: Kendra Benton MRN: [id number] Date of Birth: September 23, 1940 Referring Provider (PT): Dr. Sharia Reeve   Encounter Date: 06/24/2021   PT End of Session - 06/25/21 1721     Visit Number 1    Number of Visits 25    Date for PT Re-Evaluation 09/16/21    Authorization Time Period 06/24/2021- 09/16/2021    Progress Note Due on Visit 10    PT Start Time 1346    PT Stop Time 1429    PT Time Calculation (min) 43 min    Equipment Utilized During Treatment Gait belt    Activity Tolerance Patient tolerated treatment well;Patient limited by pain    Behavior During Therapy WFL for tasks assessed/performed             Past Medical History:  Diagnosis Date   Allergy    Anxiety    GERD (gastroesophageal reflux disease)    Headache    History of kidney stones    Hypertension    Myocardial infarction ([place])    1997   Osteoporosis    Pneumonia     Past Surgical History:  Procedure Laterality Date   ABDOMINAL HYSTERECTOMY     patient has ovaries   APPENDECTOMY     ARTERY BIOPSY Right 12/30/2017   Procedure: BIOPSY TEMPORAL ARTERY;  Surgeon: Katha Cabal, MD;  Location: [facility] ORS;  Service: Vascular;  Laterality: Right;   ARTERY BIOPSY Left 06/18/2018   Procedure: BIOPSY TEMPORAL ARTERY;  Surgeon: Katha Cabal, MD;  Location: [facility] OR[facility];  Service: Vascular;  Laterality: Left;   BACK SURGERY     CARDIAC CATHETERIZATION     CATARACT EXTRACTION W/ INTRAOCULAR LENS  IMPLANT, BILATERAL Bilateral 2010   CHOLECYSTECTOMY N/A 02/28/2016   Procedure: LAPAROSCOPIC CHOLECYSTECTOMY WITH INTRAOPERATIVE CHOLANGIOGRAM;  Surgeon: Dia Crawford III, MD;  Location: [facility] ORS;  Service: General;  Laterality: N/A;   LUMBAR FUSION  11/09/2016   L3-L5   SPINE SURGERY     Disc   TONSILLECTOMY       Vitals:   06/24/21 1358  BP: (!) 152/67  Pulse: 78  Weight: 122 lb (55.3 kg)  Height: '5\' 2"'  (1.575 m)      Subjective Assessment - 06/24/21 1404     Subjective Patient reports she was doing well after recently being discharged from PT back in October 2022 but fell back in December of 2022 and reinjured her back. She returns today complaining of worsening LBP-unable to walk without a support and limited with sitting/standing and walking tolerance.    Pertinent History Patient is a recent former patient who successfully discharged from PT in October of last year focusing on chronic LBP and balance. She met her goals and was managing her pain and able to return to independent level until a recent fall in December- experiencing a different level of low back pain- stil on left side but debilitating and more difficulty with walking and performing ADLS. Patient reports history of chronic LBP with past lumbar fusion surgery on 11/09/2016 and most recent lumbar facet joint block procedure on 06/19/2020. She reports past medical history of spondylosis, Lung Cancer, HTN. She reports increased difficulty performing ADL's yet still independent but has paid services who assist with cleaning.    How long can you sit comfortably? No more than 30 min  How long can you stand comfortably? about 30 min- I have made my grocery trips shorter due to limited standing    How long can you walk comfortably? < 10 min.    Patient Stated Goals Improve my pain and walk better with no falls.    Currently in Pain? Yes    Pain Score 8     Pain Location Back   It's lower than the pain was before   Pain Orientation Left;Posterior;Lower    Pain Descriptors / Indicators Aching;Sore;Tightness;Tender    Pain Type Chronic pain    Pain Onset More than a month ago    Pain Frequency Intermittent    Aggravating Factors  Prolonged sitting, standing, walking, reaching down towards the left side    Pain Relieving Factors Medication,  heat, rest, stretching    Effect of Pain on Daily Activities Limits my walking, ability to be independent with community outings including grocery shopping, changing my bed, taking longer walks in community and any bending or lifting.    Multiple Pain Sites No                [facility] PT Assessment - 06/24/21 1415       Assessment   Medical Diagnosis Low back/History of falling    Referring Provider (PT) Dr. Sharia Reeve    Onset Date/Surgical Date 05/10/21    Hand Dominance Right    Next MD Visit 3 months    Prior Therapy yes      Precautions   Precautions Fall      Restrictions   Weight Bearing Restrictions No      Balance Screen   Has the patient fallen in the past 6 months Yes    How many times? 1    Has the patient had a decrease in activity level because of a fear of falling?  Yes    Is the patient reluctant to leave their home because of a fear of falling?  Yes      [place] Private residence    Living Arrangements Alone    Available Help at Discharge Family    Type of [place] to enter    Home Layout One level    [place] - 4 wheels;Cane - single point;Tub bench;Grab bars - tub/shower      Prior Function   Level of Independence Independent    Vocation Retired      Associate Professor   Overall Cognitive Status Within Functional Limits for tasks assessed    Attention Focused    Focused Attention Appears intact    Memory Appears intact    Awareness Appears intact    Problem Solving Appears intact    Executive Function Reasoning;Sequencing;Organizing;Decision Making;Initiating;Self Monitoring;Self Correcting    Reasoning Appears intact    Sequencing Appears intact    Organizing Appears intact    Decision Making Appears intact    Initiating Appears intact    Self Monitoring Appears intact    Self Correcting Appears intact            OBJECTIVE   SENSATION: Grossly intact to light touch bilateral  LEs as determined by testing dermatomes L2-S2 Proprioception and hot/cold testing deferred on this date   MUSCULOSKELETAL: Tremor: None Bulk: Normal Tone: Normal No visible step-off along spinal column   Posture + for scoliosis +protracted shoulders/forward head    Gait Patient ambulated with a cane today in clinic- mild unsteadiness with  wider base of support and short reciprocal steps-+for LBP with mobility.    Palpation - Tenderness and increased prominence along T12 SP and Left side low back and paraspinals   Strength (out of 5) R/L 4/4 Hip flexion 4/4Hip ER 4/4 Hip IR 4/4 Hip abduction 4/4 Hip adduction 4/4 Hip extension 4/4 Knee extension 4/4 Knee flexion 4/4 Ankle dorsiflexion 4/4 Ankle plantarflexion       AROM (degrees) All lumbar/hip/knee = WNL +pain in low back with transition from lumbar flex to ext- +pain with left rotation and lateral side bend           Repeated Movements No centralization or peripheralization of symptoms with repeated lumbar extension or flexion.    Thomas: neg bilaterally    Passive Accessory Intervertebral Motion (PAIVM) Pt  hypomobile throughout- T6-L5     SPECIAL TESTS   SLR= Neg bilaterally   SIJ:  Thigh Thrust =neg  FOTO= 40 TUG= 18.79 sec with SPC 10 MWT= 0.82ms with SPC 5xSTS= 29.66 sec without UE support    ASSESSMENT Clinical Impression: Pt is a pleasant  81year-old female referred for low back pain and recent fall. Patient reports more constant left sided LBP and imbalance at times requiring cane with history of falls. She presents today with B LE muscle weakness with MMT and 5 x STS as well as  Impaired functional mobility and gait abnormality with increased risk of falling.  Pt will benefit from skilled PT services to address these deficits and return to pain-free function at home with improved quality of life and  decrease risk of falling.               Objective measurements completed on  examination: See above findings.                  PT Short Term Goals - 06/25/21 1539       PT SHORT TERM GOAL #1   Title Pt will decrease worst back pain as reported on NPRS by at least 2 points in order to demonstrate clinically significant reduction in back pain.    Baseline Patient currently reports up to 8/10 Left sided low back pain    Time 6    Period Weeks    Status New    Target Date 08/05/21               PT Long Term Goals - 06/25/21 1547       PT LONG TERM GOAL #1   Title Patient will increase FOTO score to equal to or greater than 53 to demonstrate statistically significant improvement in mobility and quality of life.    Baseline 06/24/2021= 40    Time 12    Period Weeks    Status New    Target Date 09/16/21      PT LONG TERM GOAL #2   Title Pt will decrease worst back pain as reported on NPRS by at least 3 points in order to demonstrate clinically significant reduction in back pain.    Baseline 06/24/2021= 8/10 left sided low back pain    Time 12    Period Weeks    Status New    Target Date 09/16/21      PT LONG TERM GOAL #3   Title Patient (> 665years old) will complete five times sit to stand test in <15 seconds indicating an increased LE strength and improved balance.    Baseline 06/25/2021= 29.66 sec without UE support  Time 12    Period Weeks    Status New    Target Date 09/16/21      PT LONG TERM GOAL #4   Title Patient will report ability to complete > or equal to 30 min grocery store outing or similar community activiities to return to her previous level of function.    Baseline 06/24/2021= Patient reports decreased outings in community and unable to be in the grocery store longer than 30 min.    Time 12    Period Weeks    Status New    Target Date 09/16/21      PT LONG TERM GOAL #5   Title Patient will increase 10 meter walk test to >1.70ms as to improve gait speed for better community ambulation and to reduce fall risk.     Baseline 06/24/2021= 0.7 m/s with SPC    Time 12    Period Weeks    Status New    Target Date 09/16/21      PT LONG TERM GOAL #6   Title Pt will decrease TUG to below 14 seconds/decrease in order to demonstrate decreased fall risk.    Baseline 06/24/2021= 18.79 sec with SPC    Time 12    Status New    Target Date 09/16/21                    Plan - 06/24/21 1536     Clinical Impression Statement Pt is a pleasant  81year-old female referred for low back pain and recent fall. Patient reports more constant left sided LBP and imbalance at times requiring cane with history of falls. She presents today with B LE muscle weakness with MMT and 5 x STS as well as  Impaired functional mobility and gait abnormality with increased risk of falling.  Pt will benefit from skilled PT services to address these deficits and return to pain-free function at home with improved quality of life and  decrease risk of falling.    Personal Factors and Comorbidities Age;Comorbidity 3+    Comorbidities Osteoporosis, HTN, CHF    Examination-Activity Limitations Bend;Caring for Others;Carry;Lift;Reach Overhead;Sit;Squat;Stairs;Stand    Examination-Participation Restrictions Cleaning;Community Activity;Laundry;Shop;Yard Work    SMerchant navy officerEvolving/Moderate complexity    Clinical Decision Making Moderate    Rehab Potential Fair    PT Frequency 2x / week    PT Duration 12 weeks    PT Treatment/Interventions ADLs/Self Care Home Management;Cryotherapy;Moist Heat;DME Instruction;Stair training;Functional mobility training;Therapeutic activities;Therapeutic exercise;Balance training;Neuromuscular re-education;Patient/family education;Manual techniques;Electrical Stimulation;Gait training;Passive range of motion;Dry needling;Vestibular;Canalith Repostioning;Energy conservation;Joint Manipulations;Spinal Manipulations    PT Next Visit Plan Manual therapy/PROM for low back symptoms,  balance/core/LE strengthening and training for improved mobility and function.    Consulted and Agree with Plan of Care Patient             Patient will benefit from skilled therapeutic intervention in order to improve the following deficits and impairments:  Abnormal gait, Decreased balance, Decreased coordination, Decreased activity tolerance, Cardiopulmonary status limiting activity, Decreased endurance, Decreased mobility, Decreased strength, Difficulty walking, Pain, Decreased range of motion, Dizziness, Hypomobility, Impaired flexibility, Improper body mechanics  Visit Diagnosis: Abnormality of gait and mobility - Plan: PT plan of care cert/re-cert  Difficulty in walking, not elsewhere classified - Plan: PT plan of care cert/re-cert  Muscle weakness (generalized) - Plan: PT plan of care cert/re-cert  Unsteadiness on feet - Plan: PT plan of care cert/re-cert  Chronic left-sided low back pain without sciatica - Plan:  PT plan of care cert/re-cert     Problem List Patient Active Problem List   Diagnosis Date Noted   Fall (on) (from) other stairs and steps, initial encounter 08/06/2020   Lumbar facet joint syndrome 06/18/2020   Spondylosis without myelopathy or radiculopathy, lumbosacral region 06/18/2020   DDD (degenerative disc disease), lumbosacral 06/18/2020   COVID 06/04/2020   Chronic pain syndrome 05/16/2020   Pharmacologic therapy 05/16/2020   Disorder of skeletal system 05/16/2020   Problems influencing health status 05/16/2020   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   Chronic hip pain (2ry area of Pain) (Bilateral) (R>L) 05/16/2020   Chronic lower extremity pain (Left) 05/16/2020   Numbness and tingling of both feet (intermittent/recurrent) 05/16/2020   Pain in rib (Bilateral) (R>L) (intermittent) 05/16/2020   Chronic knee pain (Left) 05/16/2020   Patellar pain (Left) (chronic, intermittent) 05/16/2020   Hard of hearing 05/16/2020   Carcinoma, lung, right  ([place]) 06/25/2019   S/P partial lobectomy of lung 02/25/2019   Mass of upper lobe of right lung 01/06/2019   Chronic nonintractable headache 06/14/2018   Head ache 12/03/2017   Sinus bradycardia 10/06/2017   Chronic low back pain (1ry area of Pain) (Bilateral) (L>R) w/o sciatica 08/17/2017   Panic attack as reaction to stress 08/17/2017   Anxiety 06/15/2017   Atypical chest pain 06/15/2017   Hyperlipidemia 06/15/2017   Cholecystitis with cholelithiasis 02/27/2016   Increased frequency of urination 01/27/2015   History of night sweats 05/01/2014   Dupuytren's contracture 07/18/2013   Inverted nipple 04/18/2013   Depression 04/23/2010   Fibromyalgia 04/23/2010   Hypercholesterolemia 04/23/2010   Essential hypertension 04/23/2010   Hypothyroidism 04/23/2010   Irritable colon 04/23/2010   Osteoarthritis 04/23/2010    Lewis Moccasin, PT 06/25/2021, 5:22 PM  [place] MAIN [facility] SERVICES [address] [place], [place], [address] Phone: [phone]   Fax:  [phone]  Name: Kendra Benton MRN: [id number] Date of Birth: [birth year]

## 2021-06-26 ENCOUNTER — Ambulatory Visit: Payer: Medicare Other

## 2021-06-26 ENCOUNTER — Other Ambulatory Visit: Payer: Self-pay

## 2021-06-26 DIAGNOSIS — R269 Unspecified abnormalities of gait and mobility: Secondary | ICD-10-CM | POA: Diagnosis not present

## 2021-06-26 DIAGNOSIS — R262 Difficulty in walking, not elsewhere classified: Secondary | ICD-10-CM

## 2021-06-26 DIAGNOSIS — M6281 Muscle weakness (generalized): Secondary | ICD-10-CM

## 2021-06-26 DIAGNOSIS — G8929 Other chronic pain: Secondary | ICD-10-CM

## 2021-06-26 DIAGNOSIS — R2681 Unsteadiness on feet: Secondary | ICD-10-CM

## 2021-06-26 NOTE — Therapy (Signed)
[place] MAIN [facility] SERVICES [address] [place], [place], [address] Phone: [phone]   Fax:  [phone]  Physical Therapy Treatment  Patient Details  Name: Kendra Benton MRN: [id number] Date of Birth: Apr 13, 1941 Referring Provider (PT): Dr. Sharia Reeve   Encounter Date: 06/26/2021   PT End of Session - 06/26/21 1417     Visit Number 2    Number of Visits 25    Date for PT Re-Evaluation 09/16/21    Authorization Time Period 06/24/2021- 09/16/2021    Progress Note Due on Visit 10    PT Start Time 4481    PT Stop Time 1428    PT Time Calculation (min) 40 min    Equipment Utilized During Treatment Gait belt    Activity Tolerance Patient tolerated treatment well;Patient limited by pain    Behavior During Therapy WFL for tasks assessed/performed             Past Medical History:  Diagnosis Date   Allergy    Anxiety    GERD (gastroesophageal reflux disease)    Headache    History of kidney stones    Hypertension    Myocardial infarction ([place])    1997   Osteoporosis    Pneumonia     Past Surgical History:  Procedure Laterality Date   ABDOMINAL HYSTERECTOMY     patient has ovaries   APPENDECTOMY     ARTERY BIOPSY Right 12/30/2017   Procedure: BIOPSY TEMPORAL ARTERY;  Surgeon: Katha Cabal, MD;  Location: [facility] ORS;  Service: Vascular;  Laterality: Right;   ARTERY BIOPSY Left 06/18/2018   Procedure: BIOPSY TEMPORAL ARTERY;  Surgeon: Katha Cabal, MD;  Location: [facility] ORS;  Service: Vascular;  Laterality: Left;   BACK SURGERY     CARDIAC CATHETERIZATION     CATARACT EXTRACTION W/ INTRAOCULAR LENS  IMPLANT, BILATERAL Bilateral 2010   CHOLECYSTECTOMY N/A 02/28/2016   Procedure: LAPAROSCOPIC CHOLECYSTECTOMY WITH INTRAOPERATIVE CHOLANGIOGRAM;  Surgeon: Dia Crawford III, MD;  Location: [facility] ORS;  Service: General;  Laterality: N/A;   LUMBAR FUSION  11/09/2016   L3-L5   SPINE SURGERY     Disc   TONSILLECTOMY      There  were no vitals filed for this visit.   Subjective Assessment - 06/26/21 1657     Subjective Patient reports her back pain is slightly increased today and hurting on both left and right side.    Pertinent History Patient is a recent former patient who successfully discharged from PT in October of last year focusing on chronic LBP and balance. She met her goals and was managing her pain and able to return to independent level until a recent fall in December- experiencing a different level of low back pain- stil on left side but debilitating and more difficulty with walking and performing ADLS. Patient reports history of chronic LBP with past lumbar fusion surgery on 11/09/2016 and most recent lumbar facet joint block procedure on 06/19/2020. She reports past medical history of spondylosis, Lung Cancer, HTN. She reports increased difficulty performing ADL's yet still independent but has paid services who assist with cleaning.    How long can you sit comfortably? No more than 30 min    How long can you stand comfortably? about 30 min- I have made my grocery trips shorter due to limited standing    How long can you walk comfortably? < 10 min.    Patient Stated Goals Improve my pain and walk better with  no falls.    Currently in Pain? Yes    Pain Score 7     Pain Location Back    Pain Orientation Right;Left;Posterior;Lower    Pain Descriptors / Indicators Aching;Sore;Tender    Pain Type Chronic pain    Pain Onset More than a month ago    Pain Frequency Constant    Aggravating Factors  Prolonged sitting/standing/walking/reaching down towards the left side    Pain Relieving Factors Medication, heat, rest, stretching             Manual therapy: (moist heat used along patient low back while in supine during stretching)   Supine: manual stretching including:   Single knee to chest - hold 30 sec each leg x 4 sets each Hamstring- hold 30 sec each leg x 4 sets each Lower trunk rotation - 30 sec hold  each direction x 4 sets each Piriformis stretch- Hold 30 sec x 4 sets each leg  STM to B lumbar paraspinals-gentle due to tender to touch Very tender along L2-L4 region- more sensitive on left Side  Hypomobile along lumbar section L1-L5- tender along L2 region. Grade I PA PIVM x 10 bouts- gentle.     Reviewed self stretching for home program along with recommendation of daily walking. Patient verbalized understanding of today's stretching.                          PT Education - 06/26/21 1659     Education Details stretching techniques    Person(s) Educated Patient    Methods Explanation;Demonstration;Tactile cues;Verbal cues    Comprehension Returned demonstration;Verbalized understanding;Verbal cues required;Tactile cues required;Need further instruction              PT Short Term Goals - 06/25/21 1539       PT SHORT TERM GOAL #1   Title Pt will decrease worst back pain as reported on NPRS by at least 2 points in order to demonstrate clinically significant reduction in back pain.    Baseline Patient currently reports up to 8/10 Left sided low back pain    Time 6    Period Weeks    Status New    Target Date 08/05/21               PT Long Term Goals - 06/25/21 1547       PT LONG TERM GOAL #1   Title Patient will increase FOTO score to equal to or greater than 53 to demonstrate statistically significant improvement in mobility and quality of life.    Baseline 06/24/2021= 40    Time 12    Period Weeks    Status New    Target Date 09/16/21      PT LONG TERM GOAL #2   Title Pt will decrease worst back pain as reported on NPRS by at least 3 points in order to demonstrate clinically significant reduction in back pain.    Baseline 06/24/2021= 8/10 left sided low back pain    Time 12    Period Weeks    Status New    Target Date 09/16/21      PT LONG TERM GOAL #3   Title Patient (> 65 years old) will complete five times sit to stand test in  <15 seconds indicating an increased LE strength and improved balance.    Baseline 06/25/2021= 29.66 sec without UE support    Time 12    Period Weeks  Status New    Target Date 09/16/21      PT LONG TERM GOAL #4   Title Patient will report ability to complete > or equal to 30 min grocery store outing or similar community activiities to return to her previous level of function.    Baseline 06/24/2021= Patient reports decreased outings in community and unable to be in the grocery store longer than 30 min.    Time 12    Period Weeks    Status New    Target Date 09/16/21      PT LONG TERM GOAL #5   Title Patient will increase 10 meter walk test to >1.48ms as to improve gait speed for better community ambulation and to reduce fall risk.    Baseline 06/24/2021= 0.7 m/s with SPC    Time 12    Period Weeks    Status New    Target Date 09/16/21      PT LONG TERM GOAL #6   Title Pt will decrease TUG to below 14 seconds/decrease in order to demonstrate decreased fall risk.    Baseline 06/24/2021= 18.79 sec with SPC    Time 12    Status New    Target Date 09/16/21                   Plan - 06/26/21 1417     Clinical Impression Statement Patient presents today as pain limited with increased overall pain- Tender to touch and only minimally responded to heat and stretching stating she was sore but no worse after session. Recommended walking and reviewed stretching HEP for pain relief. Patient will benefit from continued skilled PT services to assist with pain relief; restore flexibility and strength for improved mobility and quality of life    Personal Factors and Comorbidities Age;Comorbidity 3+    Comorbidities Osteoporosis, HTN, CHF    Examination-Activity Limitations Bend;Caring for Others;Carry;Lift;Reach Overhead;Sit;Squat;Stairs;Stand    Examination-Participation Restrictions Cleaning;Community Activity;Laundry;Shop;Yard Work    SJournalist, newspaper   Rehab Potential Fair    PT Frequency 2x / week    PT Duration 12 weeks    PT Treatment/Interventions ADLs/Self Care Home Management;Cryotherapy;Moist Heat;DME Instruction;Stair training;Functional mobility training;Therapeutic activities;Therapeutic exercise;Balance training;Neuromuscular re-education;Patient/family education;Manual techniques;Electrical Stimulation;Gait training;Passive range of motion;Dry needling;Vestibular;Canalith Repostioning;Energy conservation;Joint Manipulations;Spinal Manipulations    PT Next Visit Plan Manual therapy/PROM for low back symptoms, balance/core/LE strengthening and training for improved mobility and function.    PT Home Exercise Plan Reviewed Stretching HEP from previous session.    Consulted and Agree with Plan of Care Patient             Patient will benefit from skilled therapeutic intervention in order to improve the following deficits and impairments:  Abnormal gait, Decreased balance, Decreased coordination, Decreased activity tolerance, Cardiopulmonary status limiting activity, Decreased endurance, Decreased mobility, Decreased strength, Difficulty walking, Pain, Decreased range of motion, Dizziness, Hypomobility, Impaired flexibility, Improper body mechanics  Visit Diagnosis: Abnormality of gait and mobility  Difficulty in walking, not elsewhere classified  Muscle weakness (generalized)  Unsteadiness on feet  Chronic bilateral low back pain without sciatica     Problem List Patient Active Problem List   Diagnosis Date Noted   Fall (on) (from) other stairs and steps, initial encounter 08/06/2020   Lumbar facet joint syndrome 06/18/2020   Spondylosis without myelopathy or radiculopathy, lumbosacral region 06/18/2020   DDD (degenerative disc disease), lumbosacral 06/18/2020   COVID 06/04/2020   Chronic pain syndrome 05/16/2020   Pharmacologic  therapy 05/16/2020   Disorder of skeletal system  05/16/2020   Problems influencing health status 05/16/2020   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   Chronic hip pain (2ry area of Pain) (Bilateral) (R>L) 05/16/2020   Chronic lower extremity pain (Left) 05/16/2020   Numbness and tingling of both feet (intermittent/recurrent) 05/16/2020   Pain in rib (Bilateral) (R>L) (intermittent) 05/16/2020   Chronic knee pain (Left) 05/16/2020   Patellar pain (Left) (chronic, intermittent) 05/16/2020   Hard of hearing 05/16/2020   Carcinoma, lung, right ([place]) 06/25/2019   S/P partial lobectomy of lung 02/25/2019   Mass of upper lobe of right lung 01/06/2019   Chronic nonintractable headache 06/14/2018   Head ache 12/03/2017   Sinus bradycardia 10/06/2017   Chronic low back pain (1ry area of Pain) (Bilateral) (L>R) w/o sciatica 08/17/2017   Panic attack as reaction to stress 08/17/2017   Anxiety 06/15/2017   Atypical chest pain 06/15/2017   Hyperlipidemia 06/15/2017   Cholecystitis with cholelithiasis 02/27/2016   Increased frequency of urination 01/27/2015   History of night sweats 05/01/2014   Dupuytren's contracture 07/18/2013   Inverted nipple 04/18/2013   Depression 04/23/2010   Fibromyalgia 04/23/2010   Hypercholesterolemia 04/23/2010   Essential hypertension 04/23/2010   Hypothyroidism 04/23/2010   Irritable colon 04/23/2010   Osteoarthritis 04/23/2010    Lewis Moccasin, PT 06/26/2021, 5:09 PM  [place] MAIN [facility] SERVICES [address] [place], [place], [address] Phone: [phone]   Fax:  [phone]  Name: Kendra Benton MRN: [id number] Date of Birth: 11/22/1940

## 2021-07-01 ENCOUNTER — Ambulatory Visit: Payer: Medicare Other

## 2021-07-01 ENCOUNTER — Other Ambulatory Visit: Payer: Self-pay

## 2021-07-01 DIAGNOSIS — M6281 Muscle weakness (generalized): Secondary | ICD-10-CM

## 2021-07-01 DIAGNOSIS — M545 Low back pain, unspecified: Secondary | ICD-10-CM

## 2021-07-01 DIAGNOSIS — R262 Difficulty in walking, not elsewhere classified: Secondary | ICD-10-CM

## 2021-07-01 DIAGNOSIS — R269 Unspecified abnormalities of gait and mobility: Secondary | ICD-10-CM

## 2021-07-01 DIAGNOSIS — R2681 Unsteadiness on feet: Secondary | ICD-10-CM

## 2021-07-01 NOTE — Therapy (Signed)
[place] MAIN [facility] SERVICES [address] [place], [place], [address] Phone: [phone]   Fax:  [phone]  Physical Therapy Treatment  Patient Details  Name: Kendra Benton MRN: [id number] Date of Birth: 1941-06-08 Referring Provider (PT): Dr. Sharia Reeve   Encounter Date: 07/01/2021   PT End of Session - 07/01/21 1400     Visit Number 3    Number of Visits 25    Date for PT Re-Evaluation 09/16/21    Authorization Time Period 06/24/2021- 09/16/2021    Progress Note Due on Visit 10    PT Start Time 4097    PT Stop Time 1425    PT Time Calculation (min) 34 min    Equipment Utilized During Treatment Gait belt    Activity Tolerance Patient tolerated treatment well;Patient limited by pain    Behavior During Therapy WFL for tasks assessed/performed             Past Medical History:  Diagnosis Date   Allergy    Anxiety    GERD (gastroesophageal reflux disease)    Headache    History of kidney stones    Hypertension    Myocardial infarction ([place])    1997   Osteoporosis    Pneumonia     Past Surgical History:  Procedure Laterality Date   ABDOMINAL HYSTERECTOMY     patient has ovaries   APPENDECTOMY     ARTERY BIOPSY Right 12/30/2017   Procedure: BIOPSY TEMPORAL ARTERY;  Surgeon: Katha Cabal, MD;  Location: [facility] OR[facility];  Service: Vascular;  Laterality: Right;   ARTERY BIOPSY Left 06/18/2018   Procedure: BIOPSY TEMPORAL ARTERY;  Surgeon: Katha Cabal, MD;  Location: [facility] OR[facility];  Service: Vascular;  Laterality: Left;   BACK SURGERY     CARDIAC CATHETERIZATION     CATARACT EXTRACTION W/ INTRAOCULAR LENS  IMPLANT, BILATERAL Bilateral 2010   CHOLECYSTECTOMY N/A 02/28/2016   Procedure: LAPAROSCOPIC CHOLECYSTECTOMY WITH INTRAOPERATIVE CHOLANGIOGRAM;  Surgeon: Dia Crawford III, MD;  Location: [facility] ORS;  Service: General;  Laterality: N/A;   LUMBAR FUSION  11/09/2016   L3-L5   SPINE SURGERY     Disc   TONSILLECTOMY      There  were no vitals filed for this visit.   Subjective Assessment - 07/01/21 1356     Subjective Patient reports she has been in alot more pain since her last visit. States She also went grocery shopping after her visit last week.    Pertinent History Patient is a recent former patient who successfully discharged from PT in October of last year focusing on chronic LBP and balance. She met her goals and was managing her pain and able to return to independent level until a recent fall in December- experiencing a different level of low back pain- stil on left side but debilitating and more difficulty with walking and performing ADLS. Patient reports history of chronic LBP with past lumbar fusion surgery on 11/09/2016 and most recent lumbar facet joint block procedure on 06/19/2020. She reports past medical history of spondylosis, Lung Cancer, HTN. She reports increased difficulty performing ADL's yet still independent but has paid services who assist with cleaning.    How long can you sit comfortably? No more than 30 min    How long can you stand comfortably? about 30 min- I have made my grocery trips shorter due to limited standing    How long can you walk comfortably? < 10 min.    Patient Stated Goals  Improve my pain and walk better with no falls.    Currently in Pain? Yes    Pain Score 7     Pain Location Back    Pain Orientation Right;Left;Posterior;Lower    Pain Descriptors / Indicators Aching;Sharp;Sore    Pain Type Chronic pain    Pain Onset More than a month ago    Pain Frequency Constant    Aggravating Factors  prolonged sitting/standing/walking/reaching down towards the left side    Pain Relieving Factors Medication, Heat, Rest, stretching    Effect of Pain on Daily Activities Limited walking, sitting, difficulty with standing ADL's               Manual therapy: (moist heat used along patient low back while in supine during stretching)    Supine: manual stretching including:     Single knee to chest - hold 30 sec each leg x 4 sets each Hamstring- hold 30 sec each leg x 4 sets each Lower trunk rotation - 30 sec hold each direction x 4 sets each Piriformis stretch- Hold 30 sec x 4 sets each leg   In [place] position: STM to left sided lumbar paraspinals-gentle due to tender to touch. Also utilized massage stick to lower back with some reported pain relief but overall increased muscle guarding today.                               PT Education - 07/01/21 1400     Education Details Stretching techniques    Person(s) Educated Patient    Methods Explanation;Demonstration;Tactile cues;Verbal cues    Comprehension Verbalized understanding;Returned demonstration;Verbal cues required;Tactile cues required;Need further instruction              PT Short Term Goals - 06/25/21 1539       PT SHORT TERM GOAL #1   Title Pt will decrease worst back pain as reported on NPRS by at least 2 points in order to demonstrate clinically significant reduction in back pain.    Baseline Patient currently reports up to 8/10 Left sided low back pain    Time 6    Period Weeks    Status New    Target Date 08/05/21               PT Long Term Goals - 06/25/21 1547       PT LONG TERM GOAL #1   Title Patient will increase FOTO score to equal to or greater than 53 to demonstrate statistically significant improvement in mobility and quality of life.    Baseline 06/24/2021= 40    Time 12    Period Weeks    Status New    Target Date 09/16/21      PT LONG TERM GOAL #2   Title Pt will decrease worst back pain as reported on NPRS by at least 3 points in order to demonstrate clinically significant reduction in back pain.    Baseline 06/24/2021= 8/10 left sided low back pain    Time 12    Period Weeks    Status New    Target Date 09/16/21      PT LONG TERM GOAL #3   Title Patient (> 51 years old) will complete five times sit to stand test in <15 seconds  indicating an increased LE strength and improved balance.    Baseline 06/25/2021= 29.66 sec without UE support    Time 12  Period Weeks    Status New    Target Date 09/16/21      PT LONG TERM GOAL #4   Title Patient will report ability to complete > or equal to 30 min grocery store outing or similar community activiities to return to her previous level of function.    Baseline 06/24/2021= Patient reports decreased outings in community and unable to be in the grocery store longer than 30 min.    Time 12    Period Weeks    Status New    Target Date 09/16/21      PT LONG TERM GOAL #5   Title Patient will increase 10 meter walk test to >1.66ms as to improve gait speed for better community ambulation and to reduce fall risk.    Baseline 06/24/2021= 0.7 m/s with SPC    Time 12    Period Weeks    Status New    Target Date 09/16/21      PT LONG TERM GOAL #6   Title Pt will decrease TUG to below 14 seconds/decrease in order to demonstrate decreased fall risk.    Baseline 06/24/2021= 18.79 sec with SPC    Time 12    Status New    Target Date 09/16/21                   Plan - 07/01/21 1401     Clinical Impression Statement Patient presented with more overall tenderness today and unable to progress to low back stabilization or balance activities. ?Plan discussed with patient to have use heat to warm up then perform all instructed stretching followed by use of ice for 20 min at home for 1 week then return to PT in one week. Reviewed self stretching for home program along with recommendation of daily walking. Patient verbalized understanding of plan.Patient will benefit from continued skilled PT services to assist with pain relief; restore flexibility and strength for improved mobility and quality of life    Personal Factors and Comorbidities Age;Comorbidity 3+    Comorbidities Osteoporosis, HTN, CHF    Examination-Activity Limitations Bend;Caring for Others;Carry;Lift;Reach  Overhead;Sit;Squat;Stairs;Stand    Examination-Participation Restrictions Cleaning;Community Activity;Laundry;Shop;Yard Work    SProduct manager   Rehab Potential Fair    PT Frequency 2x / week    PT Duration 12 weeks    PT Treatment/Interventions ADLs/Self Care Home Management;Cryotherapy;Moist Heat;DME Instruction;Stair training;Functional mobility training;Therapeutic activities;Therapeutic exercise;Balance training;Neuromuscular re-education;Patient/family education;Manual techniques;Electrical Stimulation;Gait training;Passive range of motion;Dry needling;Vestibular;Canalith Repostioning;Energy conservation;Joint Manipulations;Spinal Manipulations    PT Next Visit Plan Manual therapy/PROM for low back symptoms, balance/core/LE strengthening and training for improved mobility and function.    PT Home Exercise Plan Reviewed Stretching HEP from previous session.    Consulted and Agree with Plan of Care Patient             Patient will benefit from skilled therapeutic intervention in order to improve the following deficits and impairments:  Abnormal gait, Decreased balance, Decreased coordination, Decreased activity tolerance, Cardiopulmonary status limiting activity, Decreased endurance, Decreased mobility, Decreased strength, Difficulty walking, Pain, Decreased range of motion, Dizziness, Hypomobility, Impaired flexibility, Improper body mechanics  Visit Diagnosis: Chronic bilateral low back pain without sciatica  Abnormality of gait and mobility  Difficulty in walking, not elsewhere classified  Muscle weakness (generalized)  Unsteadiness on feet     Problem List Patient Active Problem List   Diagnosis Date Noted   Fall (on) (from) other stairs and steps, initial encounter 08/06/2020  Lumbar facet joint syndrome 06/18/2020   Spondylosis without myelopathy or radiculopathy, lumbosacral region 06/18/2020   DDD (degenerative disc  disease), lumbosacral 06/18/2020   COVID 06/04/2020   Chronic pain syndrome 05/16/2020   Pharmacologic therapy 05/16/2020   Disorder of skeletal system 05/16/2020   Problems influencing health status 05/16/2020   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   Chronic hip pain (2ry area of Pain) (Bilateral) (R>L) 05/16/2020   Chronic lower extremity pain (Left) 05/16/2020   Numbness and tingling of both feet (intermittent/recurrent) 05/16/2020   Pain in rib (Bilateral) (R>L) (intermittent) 05/16/2020   Chronic knee pain (Left) 05/16/2020   Patellar pain (Left) (chronic, intermittent) 05/16/2020   Hard of hearing 05/16/2020   Carcinoma, lung, right ([place]) 06/25/2019   S/P partial lobectomy of lung 02/25/2019   Mass of upper lobe of right lung 01/06/2019   Chronic nonintractable headache 06/14/2018   Head ache 12/03/2017   Sinus bradycardia 10/06/2017   Chronic low back pain (1ry area of Pain) (Bilateral) (L>R) w/o sciatica 08/17/2017   Panic attack as reaction to stress 08/17/2017   Anxiety 06/15/2017   Atypical chest pain 06/15/2017   Hyperlipidemia 06/15/2017   Cholecystitis with cholelithiasis 02/27/2016   Increased frequency of urination 01/27/2015   History of night sweats 05/01/2014   Dupuytren's contracture 07/18/2013   Inverted nipple 04/18/2013   Depression 04/23/2010   Fibromyalgia 04/23/2010   Hypercholesterolemia 04/23/2010   Essential hypertension 04/23/2010   Hypothyroidism 04/23/2010   Irritable colon 04/23/2010   Osteoarthritis 04/23/2010    Lewis Moccasin, PT 07/01/2021, 2:41 PM  [place] MAIN [facility] SERVICES [address] [place], [place], [address] Phone: [phone]   Fax:  [phone]  Name: ALISAH GRANDBERRY MRN: [id number] Date of Birth: 01-Sep-1940

## 2021-07-03 ENCOUNTER — Ambulatory Visit: Payer: Medicare Other

## 2021-07-08 ENCOUNTER — Ambulatory Visit: Payer: Medicare Other

## 2021-07-08 ENCOUNTER — Other Ambulatory Visit: Payer: Self-pay

## 2021-07-08 DIAGNOSIS — M6281 Muscle weakness (generalized): Secondary | ICD-10-CM

## 2021-07-08 DIAGNOSIS — R269 Unspecified abnormalities of gait and mobility: Secondary | ICD-10-CM

## 2021-07-08 DIAGNOSIS — R262 Difficulty in walking, not elsewhere classified: Secondary | ICD-10-CM

## 2021-07-08 DIAGNOSIS — G8929 Other chronic pain: Secondary | ICD-10-CM

## 2021-07-08 DIAGNOSIS — R2681 Unsteadiness on feet: Secondary | ICD-10-CM

## 2021-07-08 NOTE — Therapy (Signed)
[place] MAIN [facility] SERVICES [address] [place], [place], [address] Phone: [phone]   Fax:  [phone]  Physical Therapy Treatment  Patient Details  Name: Kendra Benton MRN: [id number] Date of Birth: [birth year] Referring Provider (PT): Dr. Sharia Reeve   Encounter Date: 07/08/2021   PT End of Session - 07/08/21 1448     Visit Number 4    Number of Visits 25    Date for PT Re-Evaluation 09/16/21    Authorization Time Period 06/24/2021- 09/16/2021    Progress Note Due on Visit 10    PT Start Time 1347    PT Stop Time 1430    PT Time Calculation (min) 43 min    Equipment Utilized During Treatment Gait belt    Activity Tolerance Patient tolerated treatment well;Patient limited by pain    Behavior During Therapy WFL for tasks assessed/performed             Past Medical History:  Diagnosis Date   Allergy    Anxiety    GERD (gastroesophageal reflux disease)    Headache    History of kidney stones    Hypertension    Myocardial infarction ([place])    1997   Osteoporosis    Pneumonia     Past Surgical History:  Procedure Laterality Date   ABDOMINAL HYSTERECTOMY     patient has ovaries   APPENDECTOMY     ARTERY BIOPSY Right 12/30/2017   Procedure: BIOPSY TEMPORAL ARTERY;  Surgeon: Katha Cabal, MD;  Location: [facility] ORS;  Service: Vascular;  Laterality: Right;   ARTERY BIOPSY Left 06/18/2018   Procedure: BIOPSY TEMPORAL ARTERY;  Surgeon: Katha Cabal, MD;  Location: [facility] ORS;  Service: Vascular;  Laterality: Left;   BACK SURGERY     CARDIAC CATHETERIZATION     CATARACT EXTRACTION W/ INTRAOCULAR LENS  IMPLANT, BILATERAL Bilateral 2010   CHOLECYSTECTOMY N/A 02/28/2016   Procedure: LAPAROSCOPIC CHOLECYSTECTOMY WITH INTRAOPERATIVE CHOLANGIOGRAM;  Surgeon: Dia Crawford III, MD;  Location: [facility] ORS;  Service: General;  Laterality: N/A;   LUMBAR FUSION  11/09/2016   L3-L5   SPINE SURGERY     Disc   TONSILLECTOMY      There  were no vitals filed for this visit.   Subjective Assessment - 07/08/21 1355     Subjective Patient reports she is still hurting about a 6/10 today. States she has been compliant with use of heat, stretching and ice with only minimal relief.    Pertinent History Patient is a recent former patient who successfully discharged from PT in October of last year focusing on chronic LBP and balance. She met her goals and was managing her pain and able to return to independent level until a recent fall in December- experiencing a different level of low back pain- stil on left side but debilitating and more difficulty with walking and performing ADLS. Patient reports history of chronic LBP with past lumbar fusion surgery on 11/09/2016 and most recent lumbar facet joint block procedure on 06/19/2020. She reports past medical history of spondylosis, Lung Cancer, HTN. She reports increased difficulty performing ADL's yet still independent but has paid services who assist with cleaning.    How long can you sit comfortably? No more than 30 min    How long can you stand comfortably? about 30 min- I have made my grocery trips shorter due to limited standing    How long can you walk comfortably? < 10 min.    Patient  Stated Goals Improve my pain and walk better with no falls.    Currently in Pain? Yes    Pain Score 6     Pain Location Back    Pain Descriptors / Indicators Aching;Sore    Pain Type Chronic pain    Pain Radiating Towards Left buttock region    Pain Onset More than a month ago    Pain Frequency Constant    Aggravating Factors  prolonged sitting/standing/walking/reaching down towards the left side    Pain Relieving Factors Medication, heat, Rest, stretching    Effect of Pain on Daily Activities Limited walking, sitting, Difficulty with standing ADL's                 INTERVENTIONS:     Manual therapy: (moist heat used along patient low back while in supine during stretching)    Supine:  manual stretching including:    Single knee to chest - hold 30 sec each leg x 4 sets each Hamstring- hold 30 sec each leg x 4 sets each Lower trunk rotation - 30 sec hold each direction x 4 sets each Piriformis stretch- Hold 30 sec x 4 sets each leg  Long axis distraction on right LE x 30 sec x 3 (patient did report some increased pain on left LE with right side stretching.)   Inferior distraction using strap with right hip/knee flexed x 20 sec x 3.    Therapeutic Exercises:   Core stabilization- Bridging x 10 reps - Hip ER supine with yellow TB x 12 reps  - LE extended leg raise 6" off mat with core engaged x 10 reps.  - Standing Postural- push- sideglide Right ilium toward left x hold for 3 sec x 10 reps.   Education provided throughout session via VC/TC and demonstration to facilitate movement at target joints and correct muscle activation for all testing and exercises performed.                        PT Education - 07/08/21 1447     Education Details Core stabilization exercises.    Person(s) Educated Patient    Methods Explanation;Demonstration;Tactile cues;Verbal cues    Comprehension Verbalized understanding;Verbal cues required;Need further instruction;Tactile cues required;Returned demonstration              PT Short Term Goals - 06/25/21 1539       PT SHORT TERM GOAL #1   Title Pt will decrease worst back pain as reported on NPRS by at least 2 points in order to demonstrate clinically significant reduction in back pain.    Baseline Patient currently reports up to 8/10 Left sided low back pain    Time 6    Period Weeks    Status New    Target Date 08/05/21               PT Long Term Goals - 06/25/21 1547       PT LONG TERM GOAL #1   Title Patient will increase FOTO score to equal to or greater than 53 to demonstrate statistically significant improvement in mobility and quality of life.    Baseline 06/24/2021= 40    Time 12     Period Weeks    Status New    Target Date 09/16/21      PT LONG TERM GOAL #2   Title Pt will decrease worst back pain as reported on NPRS by at least 3 points in order to  demonstrate clinically significant reduction in back pain.   ° Baseline 06/24/2021= 8/10 left sided low back pain   ° Time 12   ° Period Weeks   ° Status New   ° Target Date 09/16/21   °  ° PT LONG TERM GOAL #3  ° Title Patient (> 60 years old) will complete five times sit to stand test in <15 seconds indicating an increased LE strength and improved balance.   ° Baseline 06/25/2021= 29.66 sec without UE support   ° Time 12   ° Period Weeks   ° Status New   ° Target Date 09/16/21   °  ° PT LONG TERM GOAL #4  ° Title Patient will report ability to complete > or equal to 30 min grocery store outing or similar community activiities to return to her previous level of function.   ° Baseline 06/24/2021= Patient reports decreased outings in community and unable to be in the grocery store longer than 30 min.   ° Time 12   ° Period Weeks   ° Status New   ° Target Date 09/16/21   °  ° PT LONG TERM GOAL #5  ° Title Patient will increase 10 meter walk test to >1.0m/s as to improve gait speed for better community ambulation and to reduce fall risk.   ° Baseline 06/24/2021= 0.7 m/s with SPC   ° Time 12   ° Period Weeks   ° Status New   ° Target Date 09/16/21   °  ° PT LONG TERM GOAL #6  ° Title Pt will decrease TUG to below 14 seconds/decrease in order to demonstrate decreased fall risk.   ° Baseline 06/24/2021= 18.79 sec with SPC   ° Time 12   ° Status New   ° Target Date 09/16/21   ° °  °  ° °  ° ° ° ° ° ° ° ° Plan - 07/08/21 1448   ° ° Clinical Impression Statement Patient reported some relief today with manual treatment today but still sore on left lower back. She was able to  progress to some core work without report of increased pain. Patient will benefit from continued skilled PT services to assist with pain relief; restore flexibility and strength for  improved mobility and quality of life   ° Personal Factors and Comorbidities Age;Comorbidity 3+   ° Comorbidities Osteoporosis, HTN, CHF   ° Examination-Activity Limitations Bend;Caring for Others;Carry;Lift;Reach Overhead;Sit;Squat;Stairs;Stand   ° Examination-Participation Restrictions Cleaning;Community Activity;Laundry;Shop;Yard Work   ° Stability/Clinical Decision Making Evolving/Moderate complexity   ° Rehab Potential Fair   ° PT Frequency 2x / week   ° PT Duration 12 weeks   ° PT Treatment/Interventions ADLs/Self Care Home Management;Cryotherapy;Moist Heat;DME Instruction;Stair training;Functional mobility training;Therapeutic activities;Therapeutic exercise;Balance training;Neuromuscular re-education;Patient/family education;Manual techniques;Electrical Stimulation;Gait training;Passive range of motion;Dry needling;Vestibular;Canalith Repostioning;Energy conservation;Joint Manipulations;Spinal Manipulations   ° PT Next Visit Plan Manual therapy/PROM for low back symptoms, balance/core/LE strengthening and training for improved mobility and function.   ° PT Home Exercise Plan Reviewed Stretching HEP from previous session.   ° Consulted and Agree with Plan of Care Patient   ° °  °  ° °  ° ° °Patient will benefit from skilled therapeutic intervention in order to improve the following deficits and impairments:  Abnormal gait, Decreased balance, Decreased coordination, Decreased activity tolerance, Cardiopulmonary status limiting activity, Decreased endurance, Decreased mobility, Decreased strength, Difficulty walking, Pain, Decreased range of motion, Dizziness, Hypomobility, Impaired flexibility, Improper body mechanics ° °Visit Diagnosis: °Abnormality of   gait and mobility ° °Difficulty in walking, not elsewhere classified ° °Muscle weakness (generalized) ° °Unsteadiness on feet ° °Chronic bilateral low back pain without sciatica ° ° ° ° °Problem List °Patient Active Problem List  ° Diagnosis Date Noted  ° Fall  (on) (from) other stairs and steps, initial encounter 08/06/2020  ° Lumbar facet joint syndrome 06/18/2020  ° Spondylosis without myelopathy or radiculopathy, lumbosacral region 06/18/2020  ° DDD (degenerative disc disease), lumbosacral 06/18/2020  ° COVID 06/04/2020  ° Chronic pain syndrome 05/16/2020  ° Pharmacologic therapy 05/16/2020  ° Disorder of skeletal system 05/16/2020  ° Problems influencing health status 05/16/2020  ° 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  ° Chronic hip pain (2ry area of Pain) (Bilateral) (R>L) 05/16/2020  ° Chronic lower extremity pain (Left) 05/16/2020  ° Numbness and tingling of both feet (intermittent/recurrent) 05/16/2020  ° Pain in rib (Bilateral) (R>L) (intermittent) 05/16/2020  ° Chronic knee pain (Left) 05/16/2020  ° Patellar pain (Left) (chronic, intermittent) 05/16/2020  ° Hard of hearing 05/16/2020  ° Carcinoma, lung, 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/25/2019  ° S/P partial lobectomy of lung 02/25/2019  ° Mass of upper lobe of right lung 01/06/2019  ° Chronic nonintractable headache 06/14/2018  ° Head ache 12/03/2017  ° Sinus bradycardia 10/06/2017  ° Chronic low back pain (1ry area of Pain) (Bilateral) (L>R) w/o sciatica 08/17/2017  ° Panic attack as reaction to stress 08/17/2017  ° Anxiety 06/15/2017  ° Atypical chest pain 06/15/2017  ° Hyperlipidemia 06/15/2017  ° Cholecystitis with cholelithiasis 02/27/2016  ° Increased frequency of urination 01/27/2015  ° History of night sweats 05/01/2014  ° Dupuytren's contracture 07/18/2013  ° Inverted nipple 04/18/2013  ° Depression 04/23/2010  ° Fibromyalgia 04/23/2010  ° Hypercholesterolemia 04/23/2010  ° Essential hypertension 04/23/2010  ° Hypothyroidism 04/23/2010  ° Irritable colon 04/23/2010  ° Osteoarthritis 04/23/2010  ° ° °Jeffrey N Westbrooks, PT °07/08/2021, 3:01 PM ° °[place] °[facility] °[address] °[place], [place], [address] °Phone: [phone]   Fax:  [phone] ° °Name: Syeda L  Vidrio °MRN: [id number] °Date of Birth: 09/30/1940 ° ° ° °

## 2021-07-10 ENCOUNTER — Ambulatory Visit: Payer: Medicare Other

## 2021-07-15 ENCOUNTER — Ambulatory Visit: Payer: Medicare Other | Attending: Internal Medicine

## 2021-07-15 ENCOUNTER — Other Ambulatory Visit: Payer: Self-pay

## 2021-07-15 DIAGNOSIS — R2681 Unsteadiness on feet: Secondary | ICD-10-CM | POA: Diagnosis present

## 2021-07-15 DIAGNOSIS — R269 Unspecified abnormalities of gait and mobility: Secondary | ICD-10-CM | POA: Insufficient documentation

## 2021-07-15 DIAGNOSIS — R2689 Other abnormalities of gait and mobility: Secondary | ICD-10-CM | POA: Diagnosis present

## 2021-07-15 DIAGNOSIS — R278 Other lack of coordination: Secondary | ICD-10-CM | POA: Insufficient documentation

## 2021-07-15 DIAGNOSIS — M545 Low back pain, unspecified: Secondary | ICD-10-CM | POA: Diagnosis not present

## 2021-07-15 DIAGNOSIS — M6281 Muscle weakness (generalized): Secondary | ICD-10-CM | POA: Insufficient documentation

## 2021-07-15 DIAGNOSIS — G8929 Other chronic pain: Secondary | ICD-10-CM | POA: Diagnosis present

## 2021-07-15 DIAGNOSIS — R262 Difficulty in walking, not elsewhere classified: Secondary | ICD-10-CM | POA: Diagnosis present

## 2021-07-15 NOTE — Therapy (Signed)
[place] MAIN [facility] SERVICES [address] [place], [place], [address] Phone: [phone]   Fax:  [phone]  Physical Therapy Treatment  Patient Details  Name: Kendra Benton MRN: [id number] Date of Birth: 12-29-1940 Referring Provider (PT): Dr. Sharia Reeve   Encounter Date: 07/15/2021   PT End of Session - 07/15/21 1346     Visit Number 5    Number of Visits 25    Date for PT Re-Evaluation 09/16/21    Authorization Time Period 06/24/2021- 09/16/2021    Progress Note Due on Visit 10    PT Start Time 1345    PT Stop Time 1429    PT Time Calculation (min) 44 min    Equipment Utilized During Treatment Gait belt    Activity Tolerance Patient tolerated treatment well;Patient limited by pain    Behavior During Therapy WFL for tasks assessed/performed             Past Medical History:  Diagnosis Date   Allergy    Anxiety    GERD (gastroesophageal reflux disease)    Headache    History of kidney stones    Hypertension    Myocardial infarction ([place])    1997   Osteoporosis    Pneumonia     Past Surgical History:  Procedure Laterality Date   ABDOMINAL HYSTERECTOMY     patient has ovaries   APPENDECTOMY     ARTERY BIOPSY Right 12/30/2017   Procedure: BIOPSY TEMPORAL ARTERY;  Surgeon: Katha Cabal, MD;  Location: [facility] ORS;  Service: Vascular;  Laterality: Right;   ARTERY BIOPSY Left 06/18/2018   Procedure: BIOPSY TEMPORAL ARTERY;  Surgeon: Katha Cabal, MD;  Location: [facility] ORS;  Service: Vascular;  Laterality: Left;   BACK SURGERY     CARDIAC CATHETERIZATION     CATARACT EXTRACTION W/ INTRAOCULAR LENS  IMPLANT, BILATERAL Bilateral 2010   CHOLECYSTECTOMY N/A 02/28/2016   Procedure: LAPAROSCOPIC CHOLECYSTECTOMY WITH INTRAOPERATIVE CHOLANGIOGRAM;  Surgeon: Dia Crawford III, MD;  Location: [facility] ORS;  Service: General;  Laterality: N/A;   LUMBAR FUSION  11/09/2016   L3-L5   SPINE SURGERY     Disc   TONSILLECTOMY      There  were no vitals filed for this visit.   Subjective Assessment - 07/15/21 1345     Pertinent History Patient is a recent former patient who successfully discharged from PT in October of last year focusing on chronic LBP and balance. She met her goals and was managing her pain and able to return to independent level until a recent fall in December- experiencing a different level of low back pain- stil on left side but debilitating and more difficulty with walking and performing ADLS. Patient reports history of chronic LBP with past lumbar fusion surgery on 11/09/2016 and most recent lumbar facet joint block procedure on 06/19/2020. She reports past medical history of spondylosis, Lung Cancer, HTN. She reports increased difficulty performing ADL's yet still independent but has paid services who assist with cleaning.    How long can you sit comfortably? No more than 30 min    How long can you stand comfortably? about 30 min- I have made my grocery trips shorter due to limited standing    How long can you walk comfortably? < 10 min.    Patient Stated Goals Improve my pain and walk better with no falls.    Currently in Pain? Yes    Pain Score 5     Pain Location  Back    Pain Orientation Left;Posterior;Lower    Pain Descriptors / Indicators Aching;Sore    Pain Onset More than a month ago    Pain Frequency Constant    Aggravating Factors  Prolonged siting    Pain Relieving Factors Medication, Heat, Rest    Effect of Pain on Daily Activities Limited amount time of sitting.              INTERVENTIONS:   Manual therapy:   Moist heat to low back while performing some LE/low back  stretching.  Knee to chest- Hold 30 sec x 4 each leg hamstring stretch- Hold 30 sec x 4 each leg Sciatic nerve stretch. - Hold 30 sec x 4 each leg Lower trunk rotation - Hold 30 sec x 4 each leg   Therex:   TA contract with LE on theraball- Knee to chest  Bridging with LE on theraball - x 10 reps *pinching pain  with increased reps- Improved without ball x 10 reps  On last 5 reps of bridges- combined with hip abd x 10 reps   - dead bug (TA contraction with Alt UE/LE raise with bent knee)  x 10 reps.   - quadraped- Position- Alt UE x 10 reps  followed by Alt LE x 10 reps  Education provided throughout session via VC/TC and demonstration to facilitate movement at target joints and correct muscle activation for all testing and exercises performed.                      PT Education - 07/15/21 1345     Education Details Exercise technique    Person(s) Educated Patient    Methods Explanation;Demonstration;Tactile cues;Verbal cues    Comprehension Verbalized understanding;Returned demonstration;Verbal cues required;Tactile cues required;Need further instruction              PT Short Term Goals - 06/25/21 1539       PT SHORT TERM GOAL #1   Title Pt will decrease worst back pain as reported on NPRS by at least 2 points in order to demonstrate clinically significant reduction in back pain.    Baseline Patient currently reports up to 8/10 Left sided low back pain    Time 6    Period Weeks    Status New    Target Date 08/05/21               PT Long Term Goals - 06/25/21 1547       PT LONG TERM GOAL #1   Title Patient will increase FOTO score to equal to or greater than 53 to demonstrate statistically significant improvement in mobility and quality of life.    Baseline 06/24/2021= 40    Time 12    Period Weeks    Status New    Target Date 09/16/21      PT LONG TERM GOAL #2   Title Pt will decrease worst back pain as reported on NPRS by at least 3 points in order to demonstrate clinically significant reduction in back pain.    Baseline 06/24/2021= 8/10 left sided low back pain    Time 12    Period Weeks    Status New    Target Date 09/16/21      PT LONG TERM GOAL #3   Title Patient (> 26 years old) will complete five times sit to stand test in <15 seconds  indicating an increased LE strength and improved balance.    Baseline 06/25/2021=  29.66 sec without UE support    Time 12    Period Weeks    Status New    Target Date 09/16/21      PT LONG TERM GOAL #4   Title Patient will report ability to complete > or equal to 30 min grocery store outing or similar community activiities to return to her previous level of function.    Baseline 06/24/2021= Patient reports decreased outings in community and unable to be in the grocery store longer than 30 min.    Time 12    Period Weeks    Status New    Target Date 09/16/21      PT LONG TERM GOAL #5   Title Patient will increase 10 meter walk test to >1.6ms as to improve gait speed for better community ambulation and to reduce fall risk.    Baseline 06/24/2021= 0.7 m/s with SPC    Time 12    Period Weeks    Status New    Target Date 09/16/21      PT LONG TERM GOAL #6   Title Pt will decrease TUG to below 14 seconds/decrease in order to demonstrate decreased fall risk.    Baseline 06/24/2021= 18.79 sec with SPC    Time 12    Status New    Target Date 09/16/21                   Plan - 07/15/21 1346     Clinical Impression Statement Patient presented with good motivation for today's activities. She was able to progress to more core based activities with reported fatigue as limiting factor more than pain. She was able to follow all VC for correct technique and able to attain quadraped by using fisted grip technique to decrease pressure through her wrist. Patient will benefit from continued skilled PT services to assist with pain relief; restore flexibility and strength for improved mobility and quality of life.    Personal Factors and Comorbidities Age;Comorbidity 3+    Comorbidities Osteoporosis, HTN, CHF    Examination-Activity Limitations Bend;Caring for Others;Carry;Lift;Reach Overhead;Sit;Squat;Stairs;Stand    Examination-Participation Restrictions Cleaning;Community  Activity;Laundry;Shop;Yard Work    SProduct manager   Rehab Potential Fair    PT Frequency 2x / week    PT Duration 12 weeks    PT Treatment/Interventions ADLs/Self Care Home Management;Cryotherapy;Moist Heat;DME Instruction;Stair training;Functional mobility training;Therapeutic activities;Therapeutic exercise;Balance training;Neuromuscular re-education;Patient/family education;Manual techniques;Electrical Stimulation;Gait training;Passive range of motion;Dry needling;Vestibular;Canalith Repostioning;Energy conservation;Joint Manipulations;Spinal Manipulations    PT Next Visit Plan Manual therapy/PROM for low back symptoms, balance/core/LE strengthening and training for improved mobility and function.    PT Home Exercise Plan Reviewed Stretching HEP from previous session.    Consulted and Agree with Plan of Care Patient             Patient will benefit from skilled therapeutic intervention in order to improve the following deficits and impairments:  Abnormal gait, Decreased balance, Decreased coordination, Decreased activity tolerance, Cardiopulmonary status limiting activity, Decreased endurance, Decreased mobility, Decreased strength, Difficulty walking, Pain, Decreased range of motion, Dizziness, Hypomobility, Impaired flexibility, Improper body mechanics  Visit Diagnosis: Chronic bilateral low back pain without sciatica  Abnormality of gait and mobility  Difficulty in walking, not elsewhere classified  Muscle weakness (generalized)  Unsteadiness on feet     Problem List Patient Active Problem List   Diagnosis Date Noted   Fall (on) (from) other stairs and steps, initial encounter 08/06/2020   Lumbar facet joint syndrome  06/18/2020   Spondylosis without myelopathy or radiculopathy, lumbosacral region 06/18/2020   DDD (degenerative disc disease), lumbosacral 06/18/2020   COVID 06/04/2020   Chronic pain syndrome 05/16/2020    Pharmacologic therapy 05/16/2020   Disorder of skeletal system 05/16/2020   Problems influencing health status 05/16/2020   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   Chronic hip pain (2ry area of Pain) (Bilateral) (R>L) 05/16/2020   Chronic lower extremity pain (Left) 05/16/2020   Numbness and tingling of both feet (intermittent/recurrent) 05/16/2020   Pain in rib (Bilateral) (R>L) (intermittent) 05/16/2020   Chronic knee pain (Left) 05/16/2020   Patellar pain (Left) (chronic, intermittent) 05/16/2020   Hard of hearing 05/16/2020   Carcinoma, lung, right ([place]) 06/25/2019   S/P partial lobectomy of lung 02/25/2019   Mass of upper lobe of right lung 01/06/2019   Chronic nonintractable headache 06/14/2018   Head ache 12/03/2017   Sinus bradycardia 10/06/2017   Chronic low back pain (1ry area of Pain) (Bilateral) (L>R) w/o sciatica 08/17/2017   Panic attack as reaction to stress 08/17/2017   Anxiety 06/15/2017   Atypical chest pain 06/15/2017   Hyperlipidemia 06/15/2017   Cholecystitis with cholelithiasis 02/27/2016   Increased frequency of urination 01/27/2015   History of night sweats 05/01/2014   Dupuytren's contracture 07/18/2013   Inverted nipple 04/18/2013   Depression 04/23/2010   Fibromyalgia 04/23/2010   Hypercholesterolemia 04/23/2010   Essential hypertension 04/23/2010   Hypothyroidism 04/23/2010   Irritable colon 04/23/2010   Osteoarthritis 04/23/2010    Lewis Moccasin, PT 07/16/2021, 10:00 AM  [place] MAIN [facility] SERVICES [address] [place], [place], [address] Phone: [phone]   Fax:  [phone]  Name: JALA DUNDON MRN: [id number] Date of Birth: 07/14/1940

## 2021-07-17 ENCOUNTER — Other Ambulatory Visit: Payer: Self-pay

## 2021-07-17 ENCOUNTER — Ambulatory Visit: Payer: Medicare Other

## 2021-07-17 DIAGNOSIS — M545 Low back pain, unspecified: Secondary | ICD-10-CM | POA: Diagnosis not present

## 2021-07-17 DIAGNOSIS — M6281 Muscle weakness (generalized): Secondary | ICD-10-CM

## 2021-07-17 DIAGNOSIS — R262 Difficulty in walking, not elsewhere classified: Secondary | ICD-10-CM

## 2021-07-17 DIAGNOSIS — R269 Unspecified abnormalities of gait and mobility: Secondary | ICD-10-CM

## 2021-07-17 DIAGNOSIS — R2681 Unsteadiness on feet: Secondary | ICD-10-CM

## 2021-07-17 DIAGNOSIS — G8929 Other chronic pain: Secondary | ICD-10-CM

## 2021-07-17 NOTE — Therapy (Signed)
[place] MAIN [facility] SERVICES [address] [place], [place], [address] Phone: [phone]   Fax:  [phone]  Physical Therapy Treatment  Patient Details  Name: Kendra Benton MRN: [id number] Date of Birth: 09-24-1940 Referring Provider (PT): Dr. Sharia Reeve   Encounter Date: 07/17/2021   PT End of Session - 07/17/21 1444     Visit Number 6    Number of Visits 25    Date for PT Re-Evaluation 09/16/21    Authorization Time Period 06/24/2021- 09/16/2021    Progress Note Due on Visit 10    PT Start Time 0737    PT Stop Time 1424    PT Time Calculation (min) 42 min    Equipment Utilized During Treatment Gait belt    Activity Tolerance Patient tolerated treatment well;Patient limited by pain    Behavior During Therapy WFL for tasks assessed/performed             Past Medical History:  Diagnosis Date   Allergy    Anxiety    GERD (gastroesophageal reflux disease)    Headache    History of kidney stones    Hypertension    Myocardial infarction ([place])    1997   Osteoporosis    Pneumonia     Past Surgical History:  Procedure Laterality Date   ABDOMINAL HYSTERECTOMY     patient has ovaries   APPENDECTOMY     ARTERY BIOPSY Right 12/30/2017   Procedure: BIOPSY TEMPORAL ARTERY;  Surgeon: Katha Cabal, MD;  Location: [facility] ORS;  Service: Vascular;  Laterality: Right;   ARTERY BIOPSY Left 06/18/2018   Procedure: BIOPSY TEMPORAL ARTERY;  Surgeon: Katha Cabal, MD;  Location: [facility] ORS;  Service: Vascular;  Laterality: Left;   BACK SURGERY     CARDIAC CATHETERIZATION     CATARACT EXTRACTION W/ INTRAOCULAR LENS  IMPLANT, BILATERAL Bilateral 2010   CHOLECYSTECTOMY N/A 02/28/2016   Procedure: LAPAROSCOPIC CHOLECYSTECTOMY WITH INTRAOPERATIVE CHOLANGIOGRAM;  Surgeon: Dia Crawford III, MD;  Location: [facility] ORS;  Service: General;  Laterality: N/A;   LUMBAR FUSION  11/09/2016   L3-L5   SPINE SURGERY     Disc   TONSILLECTOMY      There  were no vitals filed for this visit.   Subjective Assessment - 07/17/21 1353     Subjective Patient reports she was sore after last visit but better today- Rates pain is some better today- 4/10.    Pertinent History Patient is a recent former patient who successfully discharged from PT in October of last year focusing on chronic LBP and balance. She met her goals and was managing her pain and able to return to independent level until a recent fall in December- experiencing a different level of low back pain- stil on left side but debilitating and more difficulty with walking and performing ADLS. Patient reports history of chronic LBP with past lumbar fusion surgery on 11/09/2016 and most recent lumbar facet joint block procedure on 06/19/2020. She reports past medical history of spondylosis, Lung Cancer, HTN. She reports increased difficulty performing ADL's yet still independent but has paid services who assist with cleaning.    How long can you sit comfortably? No more than 30 min    How long can you stand comfortably? about 30 min- I have made my grocery trips shorter due to limited standing    How long can you walk comfortably? < 10 min.    Patient Stated Goals Improve my pain and walk better  with no falls.    Currently in Pain? Yes    Pain Score 4     Pain Location Back    Pain Orientation Lower    Pain Descriptors / Indicators Aching    Pain Type Chronic pain    Pain Radiating Towards Low back L> R    Pain Onset More than a month ago    Pain Frequency Constant    Aggravating Factors  Prolonged sitting; Bending over    Pain Relieving Factors Medication, heat, Rest    Effect of Pain on Daily Activities Difficulty performing ADL's, IADLs    Multiple Pain Sites No               BLood pressure=  Supine= 172/63  (left UE - supine) at rest; HR=64 Rechecked after 5 min = 179/62 mmHg; HR= 64  Sitting= 142/60 mmHg; HR=62 (Slight dizziness)   Standing= 122/65;  HR       INTERVENTIONS:    Manual therapy:    Moist heat to low back while performing some LE/low back  stabilization        Therex:    TA contract with LE lifts (start with legs on wedge- lift LE - heel toward edge of wedge) - alternating LE 2 sets of 10 reps   Bridging with LE on Wedge - x 10 reps  - TA contraction with Hip abd- Knees bent with RTB x 10 reps  - dead bug (TA contraction with Alt UE/LE raise with bent knee)  x 10 reps.    - Flutter kick (Legs straight with TA contraction) - SLR (6in off mat)  x 10 reps each leg   - Single Leg Box shape Straight leg kick- TA Contraction - X 5 reps each LE  Patient reports no dizziness in supine so exercises were controlled in that position today. The intermittent BP is a known issue and patient to keep a log of BP and discuss with MD.    Education provided throughout session via VC/TC and demonstration to facilitate movement at target joints and correct muscle activation for all testing and exercises performed.                   PT Education - 07/17/21 1443     Education Details Exercise technique    Person(s) Educated Patient    Methods Explanation;Demonstration;Tactile cues;Verbal cues    Comprehension Verbalized understanding;Tactile cues required;Returned demonstration;Verbal cues required;Need further instruction              PT Short Term Goals - 06/25/21 1539       PT SHORT TERM GOAL #1   Title Pt will decrease worst back pain as reported on NPRS by at least 2 points in order to demonstrate clinically significant reduction in back pain.    Baseline Patient currently reports up to 8/10 Left sided low back pain    Time 6    Period Weeks    Status New    Target Date 08/05/21               PT Long Term Goals - 06/25/21 1547       PT LONG TERM GOAL #1   Title Patient will increase FOTO score to equal to or greater than 53 to demonstrate statistically significant improvement in  mobility and quality of life.    Baseline 06/24/2021= 40    Time 12    Period Weeks    Status New  Target Date 09/16/21      PT LONG TERM GOAL #2   Title Pt will decrease worst back pain as reported on NPRS by at least 3 points in order to demonstrate clinically significant reduction in back pain.    Baseline 06/24/2021= 8/10 left sided low back pain    Time 12    Period Weeks    Status New    Target Date 09/16/21      PT LONG TERM GOAL #3   Title Patient (> 66 years old) will complete five times sit to stand test in <15 seconds indicating an increased LE strength and improved balance.    Baseline 06/25/2021= 29.66 sec without UE support    Time 12    Period Weeks    Status New    Target Date 09/16/21      PT LONG TERM GOAL #4   Title Patient will report ability to complete > or equal to 30 min grocery store outing or similar community activiities to return to her previous level of function.    Baseline 06/24/2021= Patient reports decreased outings in community and unable to be in the grocery store longer than 30 min.    Time 12    Period Weeks    Status New    Target Date 09/16/21      PT LONG TERM GOAL #5   Title Patient will increase 10 meter walk test to >1.66ms as to improve gait speed for better community ambulation and to reduce fall risk.    Baseline 06/24/2021= 0.7 m/s with SPC    Time 12    Period Weeks    Status New    Target Date 09/16/21      PT LONG TERM GOAL #6   Title Pt will decrease TUG to below 14 seconds/decrease in order to demonstrate decreased fall risk.    Baseline 06/24/2021= 18.79 sec with SPC    Time 12    Status New    Target Date 09/16/21                   Plan - 07/17/21 1430     Clinical Impression Statement Patient arrived today with good motivation. Patient had requested to check BP secondary to having reporting episodes at home with increased redness in face/head and some dizziness with standing but not as bad as in past. She  did present with some orthostatic BP values today so treatment focused in supine position. She was able to perform all core activities without any increased pain and no dizziness or ill feeling. She was responsive to all VC and able to return demonstration well today. Patient will benefit from continued skilled PT services to assist with pain relief; restore flexibility and strength for improved mobility and quality of life.    Personal Factors and Comorbidities Age;Comorbidity 3+    Comorbidities Osteoporosis, HTN, CHF    Examination-Activity Limitations Bend;Caring for Others;Carry;Lift;Reach Overhead;Sit;Squat;Stairs;Stand    Examination-Participation Restrictions Cleaning;Community Activity;Laundry;Shop;Yard Work    SProduct manager   Rehab Potential Fair    PT Frequency 2x / week    PT Duration 12 weeks    PT Treatment/Interventions ADLs/Self Care Home Management;Cryotherapy;Moist Heat;DME Instruction;Stair training;Functional mobility training;Therapeutic activities;Therapeutic exercise;Balance training;Neuromuscular re-education;Patient/family education;Manual techniques;Electrical Stimulation;Gait training;Passive range of motion;Dry needling;Vestibular;Canalith Repostioning;Energy conservation;Joint Manipulations;Spinal Manipulations    PT Next Visit Plan Manual therapy/PROM for low back symptoms, balance/core/LE strengthening and training for improved mobility and function.    PT Home Exercise  Plan Reviewed Stretching HEP from previous session.    Consulted and Agree with Plan of Care Patient             Patient will benefit from skilled therapeutic intervention in order to improve the following deficits and impairments:  Abnormal gait, Decreased balance, Decreased coordination, Decreased activity tolerance, Cardiopulmonary status limiting activity, Decreased endurance, Decreased mobility, Decreased strength, Difficulty walking, Pain,  Decreased range of motion, Dizziness, Hypomobility, Impaired flexibility, Improper body mechanics  Visit Diagnosis: Chronic bilateral low back pain without sciatica  Abnormality of gait and mobility  Difficulty in walking, not elsewhere classified  Muscle weakness (generalized)  Unsteadiness on feet     Problem List Patient Active Problem List   Diagnosis Date Noted   Fall (on) (from) other stairs and steps, initial encounter 08/06/2020   Lumbar facet joint syndrome 06/18/2020   Spondylosis without myelopathy or radiculopathy, lumbosacral region 06/18/2020   DDD (degenerative disc disease), lumbosacral 06/18/2020   COVID 06/04/2020   Chronic pain syndrome 05/16/2020   Pharmacologic therapy 05/16/2020   Disorder of skeletal system 05/16/2020   Problems influencing health status 05/16/2020   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   Chronic hip pain (2ry area of Pain) (Bilateral) (R>L) 05/16/2020   Chronic lower extremity pain (Left) 05/16/2020   Numbness and tingling of both feet (intermittent/recurrent) 05/16/2020   Pain in rib (Bilateral) (R>L) (intermittent) 05/16/2020   Chronic knee pain (Left) 05/16/2020   Patellar pain (Left) (chronic, intermittent) 05/16/2020   Hard of hearing 05/16/2020   Carcinoma, lung, right ([place]) 06/25/2019   S/P partial lobectomy of lung 02/25/2019   Mass of upper lobe of right lung 01/06/2019   Chronic nonintractable headache 06/14/2018   Head ache 12/03/2017   Sinus bradycardia 10/06/2017   Chronic low back pain (1ry area of Pain) (Bilateral) (L>R) w/o sciatica 08/17/2017   Panic attack as reaction to stress 08/17/2017   Anxiety 06/15/2017   Atypical chest pain 06/15/2017   Hyperlipidemia 06/15/2017   Cholecystitis with cholelithiasis 02/27/2016   Increased frequency of urination 01/27/2015   History of night sweats 05/01/2014   Dupuytren's contracture 07/18/2013   Inverted nipple 04/18/2013   Depression 04/23/2010   Fibromyalgia  04/23/2010   Hypercholesterolemia 04/23/2010   Essential hypertension 04/23/2010   Hypothyroidism 04/23/2010   Irritable colon 04/23/2010   Osteoarthritis 04/23/2010    Lewis Moccasin, PT 07/17/2021, 2:44 PM  [facility] MAIN [facility] SERVICES [address] [place], [place], [address] Phone: [phone]   Fax:  [phone]  Name: BELYNDA PAGADUAN MRN: [id number] Date of Birth: 1941-02-23

## 2021-07-22 ENCOUNTER — Ambulatory Visit: Payer: Medicare Other | Admitting: Physical Therapy

## 2021-07-22 ENCOUNTER — Other Ambulatory Visit: Payer: Self-pay

## 2021-07-22 DIAGNOSIS — R2689 Other abnormalities of gait and mobility: Secondary | ICD-10-CM

## 2021-07-22 DIAGNOSIS — R2681 Unsteadiness on feet: Secondary | ICD-10-CM

## 2021-07-22 DIAGNOSIS — G8929 Other chronic pain: Secondary | ICD-10-CM

## 2021-07-22 DIAGNOSIS — M545 Low back pain, unspecified: Secondary | ICD-10-CM

## 2021-07-22 DIAGNOSIS — R269 Unspecified abnormalities of gait and mobility: Secondary | ICD-10-CM

## 2021-07-22 DIAGNOSIS — M6281 Muscle weakness (generalized): Secondary | ICD-10-CM

## 2021-07-22 DIAGNOSIS — R262 Difficulty in walking, not elsewhere classified: Secondary | ICD-10-CM

## 2021-07-22 DIAGNOSIS — R278 Other lack of coordination: Secondary | ICD-10-CM

## 2021-07-22 NOTE — Therapy (Addendum)
[place] MAIN [facility] SERVICES [address] [place], [place], [address] Phone: [phone]   Fax:  [phone]  Physical Therapy Treatment  Patient Details  Name: Kendra Benton MRN: [id number] Date of Birth: [birth year] Referring Provider (PT): Dr. Sharia Reeve   Encounter Date: 07/22/2021   PT End of Session - 07/23/21 1502     Visit Number 7    Number of Visits 25    Date for PT Re-Evaluation 09/16/21    Authorization Time Period 06/24/2021- 09/16/2021    Progress Note Due on Visit 10    PT Start Time 1355    PT Stop Time 1430    PT Time Calculation (min) 35 min    Equipment Utilized During Treatment Gait belt    Activity Tolerance Patient tolerated treatment well;Patient limited by pain    Behavior During Therapy WFL for tasks assessed/performed             Past Medical History:  Diagnosis Date   Allergy    Anxiety    GERD (gastroesophageal reflux disease)    Headache    History of kidney stones    Hypertension    Myocardial infarction ([place])    1997   Osteoporosis    Pneumonia     Past Surgical History:  Procedure Laterality Date   ABDOMINAL HYSTERECTOMY     patient has ovaries   APPENDECTOMY     ARTERY BIOPSY Right 12/30/2017   Procedure: BIOPSY TEMPORAL ARTERY;  Surgeon: Katha Cabal, MD;  Location: [facility] ORS;  Service: Vascular;  Laterality: Right;   ARTERY BIOPSY Left 06/18/2018   Procedure: BIOPSY TEMPORAL ARTERY;  Surgeon: Katha Cabal, MD;  Location: [facility] ORS;  Service: Vascular;  Laterality: Left;   BACK SURGERY     CARDIAC CATHETERIZATION     CATARACT EXTRACTION W/ INTRAOCULAR LENS  IMPLANT, BILATERAL Bilateral 2010   CHOLECYSTECTOMY N/A 02/28/2016   Procedure: LAPAROSCOPIC CHOLECYSTECTOMY WITH INTRAOPERATIVE CHOLANGIOGRAM;  Surgeon: Dia Crawford III, MD;  Location: [facility] ORS;  Service: General;  Laterality: N/A;   LUMBAR FUSION  11/09/2016   L3-L5   SPINE SURGERY     Disc   TONSILLECTOMY      There  were no vitals filed for this visit.   Subjective Assessment - 07/23/21 1500     Subjective Patient reports she is well today however she does have a headache. Endorses 6/10 pain in back.    Pertinent History Patient is a recent former patient who successfully discharged from PT in October of last year focusing on chronic LBP and balance. She met her goals and was managing her pain and able to return to independent level until a recent fall in December- experiencing a different level of low back pain- stil on left side but debilitating and more difficulty with walking and performing ADLS. Patient reports history of chronic LBP with past lumbar fusion surgery on 11/09/2016 and most recent lumbar facet joint block procedure on 06/19/2020. She reports past medical history of spondylosis, Lung Cancer, HTN. She reports increased difficulty performing ADL's yet still independent but has paid services who assist with cleaning.    How long can you sit comfortably? No more than 30 min    How long can you stand comfortably? about 30 min- I have made my grocery trips shorter due to limited standing    How long can you walk comfortably? < 10 min.    Patient Stated Goals Improve my pain and walk better with  no falls.    Currently in Pain? Yes    Pain Score 6     Pain Location Back    Pain Descriptors / Indicators Aching    Pain Onset More than a month ago             INTERVENTIONS:  *arrived 10 minutes late limiting time for interventions*   Manual therapy:    Moist heat to low back while performing some LE/low back  stabilization   Knee to chest- Hold 30 sec x 4 each leg hamstring stretch- Hold 30 sec x 4 each leg Sciatic nerve stretch. - Hold 30 sec x 4 each leg Lower trunk rotation - Hold 30 sec x 4 each leg     Therex:    - TA activation with alternating LE lifts , 2x10 reps   - TA contraction with Hip abd- Knees bent with RTB x 10 reps   - dead bug (TA contraction with Alt UE/LE raise  with bent knee)  x 10 reps each side .    Education provided throughout session via VC/TC and demonstration to facilitate movement at target joints and correct muscle activation for all testing and exercises performed.      Clinical Impression: Pt is pleasant and demonstares excellent motivation this date. She is highly sensitive to end range stretching in hamstrings and lumbar spine, particularly with lumbar rotation. Core stabilization exercises were performed well with pt exhibiting good self-awareness of core activation. No pain reported with core. Interventions completed within session were limited by late arrival. Patient will benefit from continued skilled PT services to assist with pain relief; restore flexibility and strength for improved mobility and quality of life.          PT Short Term Goals - 06/25/21 1539       PT SHORT TERM GOAL #1   Title Pt will decrease worst back pain as reported on NPRS by at least 2 points in order to demonstrate clinically significant reduction in back pain.    Baseline Patient currently reports up to 8/10 Left sided low back pain    Time 6    Period Weeks    Status New    Target Date 08/05/21               PT Long Term Goals - 06/25/21 1547       PT LONG TERM GOAL #1   Title Patient will increase FOTO score to equal to or greater than 53 to demonstrate statistically significant improvement in mobility and quality of life.    Baseline 06/24/2021= 40    Time 12    Period Weeks    Status New    Target Date 09/16/21      PT LONG TERM GOAL #2   Title Pt will decrease worst back pain as reported on NPRS by at least 3 points in order to demonstrate clinically significant reduction in back pain.    Baseline 06/24/2021= 8/10 left sided low back pain    Time 12    Period Weeks    Status New    Target Date 09/16/21      PT LONG TERM GOAL #3   Title Patient (> 83 years old) will complete five times sit to stand test in <15 seconds  indicating an increased LE strength and improved balance.    Baseline 06/25/2021= 29.66 sec without UE support    Time 12    Period Weeks    Status New  Target Date 09/16/21      PT LONG TERM GOAL #4   Title Patient will report ability to complete > or equal to 30 min grocery store outing or similar community activiities to return to her previous level of function.    Baseline 06/24/2021= Patient reports decreased outings in community and unable to be in the grocery store longer than 30 min.    Time 12    Period Weeks    Status New    Target Date 09/16/21      PT LONG TERM GOAL #5   Title Patient will increase 10 meter walk test to >1.72ms as to improve gait speed for better community ambulation and to reduce fall risk.    Baseline 06/24/2021= 0.7 m/s with SPC    Time 12    Period Weeks    Status New    Target Date 09/16/21      PT LONG TERM GOAL #6   Title Pt will decrease TUG to below 14 seconds/decrease in order to demonstrate decreased fall risk.    Baseline 06/24/2021= 18.79 sec with SPC    Time 12    Status New    Target Date 09/16/21                   Plan - 07/23/21 1507     Clinical Impression Statement Pt is pleasant and demonstares excellent motivation this date. She is highly sensitive to end range stretching in hamstrings and lumbar spine, particularly with lumbar rotation. Core stabilization exercises were performed well with pt exhibiting good self-awareness of core activation. No pain reported with core. Interventions completed within session were limited by late arrival. Patient will benefit from continued skilled PT services to assist with pain relief; restore flexibility and strength for improved mobility and quality of life.    Personal Factors and Comorbidities Age;Comorbidity 3+    Comorbidities Osteoporosis, HTN, CHF    Examination-Activity Limitations Bend;Caring for Others;Carry;Lift;Reach Overhead;Sit;Squat;Stairs;Stand     Examination-Participation Restrictions Cleaning;Community Activity;Laundry;Shop;Yard Work    SProduct manager   Rehab Potential Fair    PT Frequency 2x / week    PT Duration 12 weeks    PT Treatment/Interventions ADLs/Self Care Home Management;Cryotherapy;Moist Heat;DME Instruction;Stair training;Functional mobility training;Therapeutic activities;Therapeutic exercise;Balance training;Neuromuscular re-education;Patient/family education;Manual techniques;Electrical Stimulation;Gait training;Passive range of motion;Dry needling;Vestibular;Canalith Repostioning;Energy conservation;Joint Manipulations;Spinal Manipulations    PT Next Visit Plan Manual therapy/PROM for low back symptoms, balance/core/LE strengthening and training for improved mobility and function.    PT Home Exercise Plan Reviewed Stretching HEP from previous session.    Consulted and Agree with Plan of Care Patient             Patient will benefit from skilled therapeutic intervention in order to improve the following deficits and impairments:  Abnormal gait, Decreased balance, Decreased coordination, Decreased activity tolerance, Cardiopulmonary status limiting activity, Decreased endurance, Decreased mobility, Decreased strength, Difficulty walking, Pain, Decreased range of motion, Dizziness, Hypomobility, Impaired flexibility, Improper body mechanics  Visit Diagnosis: Chronic bilateral low back pain without sciatica  Abnormality of gait and mobility  Difficulty in walking, not elsewhere classified  Muscle weakness (generalized)  Unsteadiness on feet  Chronic left-sided low back pain without sciatica  Other lack of coordination  Other abnormalities of gait and mobility     Problem List Patient Active Problem List   Diagnosis Date Noted   Fall (on) (from) other stairs and steps, initial encounter 08/06/2020   Lumbar facet joint syndrome 06/18/2020  Spondylosis  without myelopathy or radiculopathy, lumbosacral region 06/18/2020   DDD (degenerative disc disease), lumbosacral 06/18/2020   COVID 06/04/2020   Chronic pain syndrome 05/16/2020   Pharmacologic therapy 05/16/2020   Disorder of skeletal system 05/16/2020   Problems influencing health status 05/16/2020   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   Chronic hip pain (2ry area of Pain) (Bilateral) (R>L) 05/16/2020   Chronic lower extremity pain (Left) 05/16/2020   Numbness and tingling of both feet (intermittent/recurrent) 05/16/2020   Pain in rib (Bilateral) (R>L) (intermittent) 05/16/2020   Chronic knee pain (Left) 05/16/2020   Patellar pain (Left) (chronic, intermittent) 05/16/2020   Hard of hearing 05/16/2020   Carcinoma, lung, right ([place]) 06/25/2019   S/P partial lobectomy of lung 02/25/2019   Mass of upper lobe of right lung 01/06/2019   Chronic nonintractable headache 06/14/2018   Head ache 12/03/2017   Sinus bradycardia 10/06/2017   Chronic low back pain (1ry area of Pain) (Bilateral) (L>R) w/o sciatica 08/17/2017   Panic attack as reaction to stress 08/17/2017   Anxiety 06/15/2017   Atypical chest pain 06/15/2017   Hyperlipidemia 06/15/2017   Cholecystitis with cholelithiasis 02/27/2016   Increased frequency of urination 01/27/2015   History of night sweats 05/01/2014   Dupuytren's contracture 07/18/2013   Inverted nipple 04/18/2013   Depression 04/23/2010   Fibromyalgia 04/23/2010   Hypercholesterolemia 04/23/2010   Essential hypertension 04/23/2010   Hypothyroidism 04/23/2010   Irritable colon 04/23/2010   Osteoarthritis 04/23/2010    Patrina Levering PT, DPT   [place] [facility] MAIN [facility] SERVICES [address] [place], [place], [address] Phone: [phone]   Fax:  [phone]  Name: CHRISTINE MORTON MRN: [id number] Date of Birth: February 07, 1941

## 2021-07-24 ENCOUNTER — Ambulatory Visit: Payer: Medicare Other

## 2021-07-24 ENCOUNTER — Other Ambulatory Visit: Payer: Self-pay

## 2021-07-24 DIAGNOSIS — M545 Low back pain, unspecified: Secondary | ICD-10-CM | POA: Diagnosis not present

## 2021-07-24 DIAGNOSIS — R269 Unspecified abnormalities of gait and mobility: Secondary | ICD-10-CM

## 2021-07-24 DIAGNOSIS — M6281 Muscle weakness (generalized): Secondary | ICD-10-CM

## 2021-07-24 DIAGNOSIS — R262 Difficulty in walking, not elsewhere classified: Secondary | ICD-10-CM

## 2021-07-24 DIAGNOSIS — R2681 Unsteadiness on feet: Secondary | ICD-10-CM

## 2021-07-24 DIAGNOSIS — G8929 Other chronic pain: Secondary | ICD-10-CM

## 2021-07-24 NOTE — Therapy (Signed)
[place] MAIN [facility] SERVICES [address] [place], [place], [address] Phone: [phone]   Fax:  [phone]  Physical Therapy Treatment  Patient Details  Name: Kendra Benton MRN: [id number] Date of Birth: 04-27-1941 Referring Provider (PT): Dr. Sharia Reeve   Encounter Date: 07/24/2021   PT End of Session - 07/24/21 1941     Visit Number 8    Number of Visits 25    Date for PT Re-Evaluation 09/16/21    Authorization Time Period 06/24/2021- 09/16/2021    Progress Note Due on Visit 10    PT Start Time 0354    PT Stop Time 1429    PT Time Calculation (min) 40 min    Equipment Utilized During Treatment Gait belt    Activity Tolerance Patient tolerated treatment well;Patient limited by pain    Behavior During Therapy WFL for tasks assessed/performed             Past Medical History:  Diagnosis Date   Allergy    Anxiety    GERD (gastroesophageal reflux disease)    Headache    History of kidney stones    Hypertension    Myocardial infarction ([place])    1997   Osteoporosis    Pneumonia     Past Surgical History:  Procedure Laterality Date   ABDOMINAL HYSTERECTOMY     patient has ovaries   APPENDECTOMY     ARTERY BIOPSY Right 12/30/2017   Procedure: BIOPSY TEMPORAL ARTERY;  Surgeon: Katha Cabal, MD;  Location: [facility] ORS;  Service: Vascular;  Laterality: Right;   ARTERY BIOPSY Left 06/18/2018   Procedure: BIOPSY TEMPORAL ARTERY;  Surgeon: Katha Cabal, MD;  Location: [facility] ORS;  Service: Vascular;  Laterality: Left;   BACK SURGERY     CARDIAC CATHETERIZATION     CATARACT EXTRACTION W/ INTRAOCULAR LENS  IMPLANT, BILATERAL Bilateral 2010   CHOLECYSTECTOMY N/A 02/28/2016   Procedure: LAPAROSCOPIC CHOLECYSTECTOMY WITH INTRAOPERATIVE CHOLANGIOGRAM;  Surgeon: Dia Crawford III, MD;  Location: [facility] ORS;  Service: General;  Laterality: N/A;   LUMBAR FUSION  11/09/2016   L3-L5   SPINE SURGERY     Disc   TONSILLECTOMY      There  were no vitals filed for this visit.   Subjective Assessment - 07/24/21 1940     Subjective Patient reports her back pain seems to be improving - rating at a 4/10 today and states plans to go to grocery store later today.    Pertinent History Patient is a recent former patient who successfully discharged from PT in October of last year focusing on chronic LBP and balance. She met her goals and was managing her pain and able to return to independent level until a recent fall in December- experiencing a different level of low back pain- stil on left side but debilitating and more difficulty with walking and performing ADLS. Patient reports history of chronic LBP with past lumbar fusion surgery on 11/09/2016 and most recent lumbar facet joint block procedure on 06/19/2020. She reports past medical history of spondylosis, Lung Cancer, HTN. She reports increased difficulty performing ADL's yet still independent but has paid services who assist with cleaning.    How long can you sit comfortably? No more than 30 min    How long can you stand comfortably? about 30 min- I have made my grocery trips shorter due to limited standing    How long can you walk comfortably? < 10 min.    Patient Stated  Goals Improve my pain and walk better with no falls.    Currently in Pain? Yes    Pain Score 4     Pain Location Back    Pain Orientation Lower    Pain Descriptors / Indicators Aching    Pain Type Chronic pain    Pain Onset More than a month ago              Blood pressure:   Supine 153/61 mmHg HR= 69  Sitting 151/69 mmHg  Standing137/67 mmHg - Mild dizziness noted    Manual therapy:    Moist heat to low back while performing some LE/low back  stabilization   Knee to chest- Hold 30 sec x 4 each leg hamstring stretch- Hold 30 sec x 4 each leg Hip IR/ER- Hold 20 sec x 3 sets each leg- Increased tightness with HIP IR Hip circles. - Hold 30 sec x 4 each leg Lower trunk rotation - Hold 30 sec x 4  each leg *Mild report of increased Left side LBP with end range     Therex:  - TA activation with hip adduction ball squeeze x 5 sec hold x12 - TA activation with hip abduction - Knees bent- GTB x 15 reps - TA activation with alternating LE lifts , 2x10 reps  - - Flutter kick (Legs straight with TA contraction) - SLR (6in off mat)  x 10 reps each leg   Single Leg Box shape Straight leg kick- TA Contraction - X 12 reps each LE - dead bug (TA contraction with Alt UE/LE raise with bent knee)  x 20 reps each side .    Education provided throughout session via VC/TC and demonstration to facilitate movement at target joints and correct muscle activation for all testing and exercises performed.                     PT Education - 07/24/21 1941     Education Details Exercise technique    Person(s) Educated Patient    Methods Explanation;Demonstration;Tactile cues;Verbal cues    Comprehension Verbalized understanding;Returned demonstration;Verbal cues required;Tactile cues required;Need further instruction              PT Short Term Goals - 06/25/21 1539       PT SHORT TERM GOAL #1   Title Pt will decrease worst back pain as reported on NPRS by at least 2 points in order to demonstrate clinically significant reduction in back pain.    Baseline Patient currently reports up to 8/10 Left sided low back pain    Time 6    Period Weeks    Status New    Target Date 08/05/21               PT Long Term Goals - 06/25/21 1547       PT LONG TERM GOAL #1   Title Patient will increase FOTO score to equal to or greater than 53 to demonstrate statistically significant improvement in mobility and quality of life.    Baseline 06/24/2021= 40    Time 12    Period Weeks    Status New    Target Date 09/16/21      PT LONG TERM GOAL #2   Title Pt will decrease worst back pain as reported on NPRS by at least 3 points in order to demonstrate clinically significant reduction in  back pain.    Baseline 06/24/2021= 8/10 left sided low back pain  Time 12    Period Weeks    Status New    Target Date 09/16/21      PT LONG TERM GOAL #3   Title Patient (> 52 years old) will complete five times sit to stand test in <15 seconds indicating an increased LE strength and improved balance.    Baseline 06/25/2021= 29.66 sec without UE support    Time 12    Period Weeks    Status New    Target Date 09/16/21      PT LONG TERM GOAL #4   Title Patient will report ability to complete > or equal to 30 min grocery store outing or similar community activiities to return to her previous level of function.    Baseline 06/24/2021= Patient reports decreased outings in community and unable to be in the grocery store longer than 30 min.    Time 12    Period Weeks    Status New    Target Date 09/16/21      PT LONG TERM GOAL #5   Title Patient will increase 10 meter walk test to >1.5ms as to improve gait speed for better community ambulation and to reduce fall risk.    Baseline 06/24/2021= 0.7 m/s with SPC    Time 12    Period Weeks    Status New    Target Date 09/16/21      PT LONG TERM GOAL #6   Title Pt will decrease TUG to below 14 seconds/decrease in order to demonstrate decreased fall risk.    Baseline 06/24/2021= 18.79 sec with SPC    Time 12    Status New    Target Date 09/16/21                   Plan - 07/24/21 1523     Clinical Impression Statement Patient arrived today with good motivation and reported decreased overall pain with PT session today.  She continues to exhibit some left sided low back pain- worse with end range stretching. She continues to have some symptoms with light headiness/dizziness with position changes and going to call her PCP to discuss. No issues with progressing reps with core exercises and pain well controlled throughout session. Patient will benefit from continued skilled PT services to assist with pain relief; restore flexibility and  strength for improved mobility and quality of life.    Personal Factors and Comorbidities Age;Comorbidity 3+    Comorbidities Osteoporosis, HTN, CHF    Examination-Activity Limitations Bend;Caring for Others;Carry;Lift;Reach Overhead;Sit;Squat;Stairs;Stand    Examination-Participation Restrictions Cleaning;Community Activity;Laundry;Shop;Yard Work    SProduct manager   Rehab Potential Fair    PT Frequency 2x / week    PT Duration 12 weeks    PT Treatment/Interventions ADLs/Self Care Home Management;Cryotherapy;Moist Heat;DME Instruction;Stair training;Functional mobility training;Therapeutic activities;Therapeutic exercise;Balance training;Neuromuscular re-education;Patient/family education;Manual techniques;Electrical Stimulation;Gait training;Passive range of motion;Dry needling;Vestibular;Canalith Repostioning;Energy conservation;Joint Manipulations;Spinal Manipulations    PT Next Visit Plan Manual therapy/PROM for low back symptoms, balance/core/LE strengthening and training for improved mobility and function.    PT Home Exercise Plan Reviewed Stretching HEP from previous session.    Consulted and Agree with Plan of Care Patient             Patient will benefit from skilled therapeutic intervention in order to improve the following deficits and impairments:  Abnormal gait, Decreased balance, Decreased coordination, Decreased activity tolerance, Cardiopulmonary status limiting activity, Decreased endurance, Decreased mobility, Decreased strength, Difficulty walking, Pain, Decreased range of  motion, Dizziness, Hypomobility, Impaired flexibility, Improper body mechanics  Visit Diagnosis: Chronic bilateral low back pain without sciatica  Difficulty in walking, not elsewhere classified  Abnormality of gait and mobility  Unsteadiness on feet  Muscle weakness (generalized)     Problem List Patient Active Problem List   Diagnosis Date  Noted   Fall (on) (from) other stairs and steps, initial encounter 08/06/2020   Lumbar facet joint syndrome 06/18/2020   Spondylosis without myelopathy or radiculopathy, lumbosacral region 06/18/2020   DDD (degenerative disc disease), lumbosacral 06/18/2020   COVID 06/04/2020   Chronic pain syndrome 05/16/2020   Pharmacologic therapy 05/16/2020   Disorder of skeletal system 05/16/2020   Problems influencing health status 05/16/2020   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   Chronic hip pain (2ry area of Pain) (Bilateral) (R>L) 05/16/2020   Chronic lower extremity pain (Left) 05/16/2020   Numbness and tingling of both feet (intermittent/recurrent) 05/16/2020   Pain in rib (Bilateral) (R>L) (intermittent) 05/16/2020   Chronic knee pain (Left) 05/16/2020   Patellar pain (Left) (chronic, intermittent) 05/16/2020   Hard of hearing 05/16/2020   Carcinoma, lung, right ([place]) 06/25/2019   S/P partial lobectomy of lung 02/25/2019   Mass of upper lobe of right lung 01/06/2019   Chronic nonintractable headache 06/14/2018   Head ache 12/03/2017   Sinus bradycardia 10/06/2017   Chronic low back pain (1ry area of Pain) (Bilateral) (L>R) w/o sciatica 08/17/2017   Panic attack as reaction to stress 08/17/2017   Anxiety 06/15/2017   Atypical chest pain 06/15/2017   Hyperlipidemia 06/15/2017   Cholecystitis with cholelithiasis 02/27/2016   Increased frequency of urination 01/27/2015   History of night sweats 05/01/2014   Dupuytren's contracture 07/18/2013   Inverted nipple 04/18/2013   Depression 04/23/2010   Fibromyalgia 04/23/2010   Hypercholesterolemia 04/23/2010   Essential hypertension 04/23/2010   Hypothyroidism 04/23/2010   Irritable colon 04/23/2010   Osteoarthritis 04/23/2010    Lewis Moccasin, PT 07/24/2021, 7:46 PM  [place] MAIN [facility] SERVICES [address] [place], [place], [address] Phone: [phone]   Fax:   [phone]  Name: Kendra Benton MRN: [id number] Date of Birth: [birth year]

## 2021-07-29 ENCOUNTER — Ambulatory Visit: Payer: Medicare Other

## 2021-07-29 ENCOUNTER — Other Ambulatory Visit: Payer: Self-pay

## 2021-07-29 DIAGNOSIS — M545 Low back pain, unspecified: Secondary | ICD-10-CM | POA: Diagnosis not present

## 2021-07-29 DIAGNOSIS — G8929 Other chronic pain: Secondary | ICD-10-CM

## 2021-07-29 DIAGNOSIS — M6281 Muscle weakness (generalized): Secondary | ICD-10-CM

## 2021-07-29 DIAGNOSIS — R2681 Unsteadiness on feet: Secondary | ICD-10-CM

## 2021-07-29 DIAGNOSIS — R262 Difficulty in walking, not elsewhere classified: Secondary | ICD-10-CM

## 2021-07-29 NOTE — Therapy (Signed)
[place] MAIN [facility] SERVICES [address] [place], [place], [address] Phone: [phone]   Fax:  [phone]  Physical Therapy Treatment  Patient Details  Name: Kendra Benton MRN: [id number] Date of Birth: 1941-01-17 Referring Provider (PT): Dr. Sharia Reeve   Encounter Date: 07/29/2021   PT End of Session - 07/29/21 1354     Visit Number 9    Number of Visits 25    Date for PT Re-Evaluation 09/16/21    Authorization Time Period 06/24/2021- 09/16/2021    Progress Note Due on Visit 10    PT Start Time 3832    PT Stop Time 1429    PT Time Calculation (min) 41 min    Equipment Utilized During Treatment Gait belt    Activity Tolerance Patient tolerated treatment well;Patient limited by pain    Behavior During Therapy WFL for tasks assessed/performed             Past Medical History:  Diagnosis Date   Allergy    Anxiety    GERD (gastroesophageal reflux disease)    Headache    History of kidney stones    Hypertension    Myocardial infarction ([place])    1997   Osteoporosis    Pneumonia     Past Surgical History:  Procedure Laterality Date   ABDOMINAL HYSTERECTOMY     patient has ovaries   APPENDECTOMY     ARTERY BIOPSY Right 12/30/2017   Procedure: BIOPSY TEMPORAL ARTERY;  Surgeon: Katha Cabal, MD;  Location: [facility] ORS;  Service: Vascular;  Laterality: Right;   ARTERY BIOPSY Left 06/18/2018   Procedure: BIOPSY TEMPORAL ARTERY;  Surgeon: Katha Cabal, MD;  Location: [facility] ORS;  Service: Vascular;  Laterality: Left;   BACK SURGERY     CARDIAC CATHETERIZATION     CATARACT EXTRACTION W/ INTRAOCULAR LENS  IMPLANT, BILATERAL Bilateral 2010   CHOLECYSTECTOMY N/A 02/28/2016   Procedure: LAPAROSCOPIC CHOLECYSTECTOMY WITH INTRAOPERATIVE CHOLANGIOGRAM;  Surgeon: Dia Crawford III, MD;  Location: [facility] ORS;  Service: General;  Laterality: N/A;   LUMBAR FUSION  11/09/2016   L3-L5   SPINE SURGERY     Disc   TONSILLECTOMY      There  were no vitals filed for this visit.   Subjective Assessment - 07/29/21 1351     Subjective Patient reports soreness in low back but attributes to increased housework over the weekend. She states overall doing better and was able to walk 25 min in park over the weekend.    Pertinent History Patient is a recent former patient who successfully discharged from PT in October of last year focusing on chronic LBP and balance. She met her goals and was managing her pain and able to return to independent level until a recent fall in December- experiencing a different level of low back pain- stil on left side but debilitating and more difficulty with walking and performing ADLS. Patient reports history of chronic LBP with past lumbar fusion surgery on 11/09/2016 and most recent lumbar facet joint block procedure on 06/19/2020. She reports past medical history of spondylosis, Lung Cancer, HTN. She reports increased difficulty performing ADL's yet still independent but has paid services who assist with cleaning.    How long can you sit comfortably? No more than 30 min    How long can you stand comfortably? about 30 min- I have made my grocery trips shorter due to limited standing    How long can you walk comfortably? < 10  min.    Patient Stated Goals Improve my pain and walk better with no falls.    Currently in Pain? Yes    Pain Score 4     Pain Location Back    Pain Orientation Left;Lower;Posterior    Pain Descriptors / Indicators Aching;Sore    Pain Type Chronic pain    Pain Onset More than a month ago    Pain Frequency Constant    Aggravating Factors  Prolonged sitting; bending over    Pain Relieving Factors Medication, heat, Rest, changing positions    Effect of Pain on Daily Activities Difficulty performing ADLs               INTERVENTIONS:   Therex:   Verbal review of self stretching- and patient performed supine selfLE/Low back stretching: (while laying on moist heat)   Single knee to  chest Lower trunk rotation Piriformis Hamstring  20sec hold x 3 sec each. Patient with good recall of all exercises without much verbal instruction   Bridges with 10 sec hold x 5 sets. VC for TA and gluteal contraction.   TA contraction with Hip ABD  with GTB x 10 reps.   Lumbar flex stretch - using large silver theraball - forward x 10 reps.   Sitting TA contraction on theraball with Alt UE lifts and then LE lifts x 10 reps each.   Sit to stand from theraball with TA contraction x 10 rep.   Education provided throughout session via VC/TC and demonstration to facilitate movement at target joints and correct muscle activation for all testing and exercises performed.                           PT Education - 07/29/21 1353     Education Details Exercise technique    Person(s) Educated Patient    Methods Explanation;Demonstration;Tactile cues;Verbal cues    Comprehension Verbalized understanding;Returned demonstration;Verbal cues required;Tactile cues required;Need further instruction              PT Short Term Goals - 06/25/21 1539       PT SHORT TERM GOAL #1   Title Pt will decrease worst back pain as reported on NPRS by at least 2 points in order to demonstrate clinically significant reduction in back pain.    Baseline Patient currently reports up to 8/10 Left sided low back pain    Time 6    Period Weeks    Status New    Target Date 08/05/21               PT Long Term Goals - 06/25/21 1547       PT LONG TERM GOAL #1   Title Patient will increase FOTO score to equal to or greater than 53 to demonstrate statistically significant improvement in mobility and quality of life.    Baseline 06/24/2021= 40    Time 12    Period Weeks    Status New    Target Date 09/16/21      PT LONG TERM GOAL #2   Title Pt will decrease worst back pain as reported on NPRS by at least 3 points in order to demonstrate clinically significant reduction in back  pain.    Baseline 06/24/2021= 8/10 left sided low back pain    Time 12    Period Weeks    Status New    Target Date 09/16/21      PT LONG TERM GOAL #  3   Title Patient (> 87 years old) will complete five times sit to stand test in <15 seconds indicating an increased LE strength and improved balance.    Baseline 06/25/2021= 29.66 sec without UE support    Time 12    Period Weeks    Status New    Target Date 09/16/21      PT LONG TERM GOAL #4   Title Patient will report ability to complete > or equal to 30 min grocery store outing or similar community activiities to return to her previous level of function.    Baseline 06/24/2021= Patient reports decreased outings in community and unable to be in the grocery store longer than 30 min.    Time 12    Period Weeks    Status New    Target Date 09/16/21      PT LONG TERM GOAL #5   Title Patient will increase 10 meter walk test to >1.50ms as to improve gait speed for better community ambulation and to reduce fall risk.    Baseline 06/24/2021= 0.7 m/s with SPC    Time 12    Period Weeks    Status New    Target Date 09/16/21      PT LONG TERM GOAL #6   Title Pt will decrease TUG to below 14 seconds/decrease in order to demonstrate decreased fall risk.    Baseline 06/24/2021= 18.79 sec with SPC    Time 12    Status New    Target Date 09/16/21                   Plan - 07/29/21 1354     Clinical Impression Statement Patient presents with good motivation today and reports improving pain overall. She was able to perform supine and seated therex today with good activation of transverse abdominals without increase of Low back pain. She was challenged with theraball (seated) with some soreness in low back and core. Patient responded  well stating  that was good and challenging. Patient will benefit from continued skilled PT services to assist with pain relief; restore flexibility and strength for improved mobility and quality of life     Personal Factors and Comorbidities Age;Comorbidity 3+    Comorbidities Osteoporosis, HTN, CHF    Examination-Activity Limitations Bend;Caring for Others;Carry;Lift;Reach Overhead;Sit;Squat;Stairs;Stand    Examination-Participation Restrictions Cleaning;Community Activity;Laundry;Shop;Yard Work    SProduct manager   Rehab Potential Fair    PT Frequency 2x / week    PT Duration 12 weeks    PT Treatment/Interventions ADLs/Self Care Home Management;Cryotherapy;Moist Heat;DME Instruction;Stair training;Functional mobility training;Therapeutic activities;Therapeutic exercise;Balance training;Neuromuscular re-education;Patient/family education;Manual techniques;Electrical Stimulation;Gait training;Passive range of motion;Dry needling;Vestibular;Canalith Repostioning;Energy conservation;Joint Manipulations;Spinal Manipulations    PT Next Visit Plan Manual therapy/PROM for low back symptoms, balance/core/LE strengthening and training for improved mobility and function.    PT Home Exercise Plan Reviewed Stretching HEP from previous session.    Consulted and Agree with Plan of Care Patient             Patient will benefit from skilled therapeutic intervention in order to improve the following deficits and impairments:  Abnormal gait, Decreased balance, Decreased coordination, Decreased activity tolerance, Cardiopulmonary status limiting activity, Decreased endurance, Decreased mobility, Decreased strength, Difficulty walking, Pain, Decreased range of motion, Dizziness, Hypomobility, Impaired flexibility, Improper body mechanics  Visit Diagnosis: Chronic bilateral low back pain without sciatica  Muscle weakness (generalized)  Difficulty in walking, not elsewhere classified  Unsteadiness on feet  Problem List Patient Active Problem List   Diagnosis Date Noted   Fall (on) (from) other stairs and steps, initial encounter 08/06/2020   Lumbar  facet joint syndrome 06/18/2020   Spondylosis without myelopathy or radiculopathy, lumbosacral region 06/18/2020   DDD (degenerative disc disease), lumbosacral 06/18/2020   COVID 06/04/2020   Chronic pain syndrome 05/16/2020   Pharmacologic therapy 05/16/2020   Disorder of skeletal system 05/16/2020   Problems influencing health status 05/16/2020   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   Chronic hip pain (2ry area of Pain) (Bilateral) (R>L) 05/16/2020   Chronic lower extremity pain (Left) 05/16/2020   Numbness and tingling of both feet (intermittent/recurrent) 05/16/2020   Pain in rib (Bilateral) (R>L) (intermittent) 05/16/2020   Chronic knee pain (Left) 05/16/2020   Patellar pain (Left) (chronic, intermittent) 05/16/2020   Hard of hearing 05/16/2020   Carcinoma, lung, right ([place]) 06/25/2019   S/P partial lobectomy of lung 02/25/2019   Mass of upper lobe of right lung 01/06/2019   Chronic nonintractable headache 06/14/2018   Head ache 12/03/2017   Sinus bradycardia 10/06/2017   Chronic low back pain (1ry area of Pain) (Bilateral) (L>R) w/o sciatica 08/17/2017   Panic attack as reaction to stress 08/17/2017   Anxiety 06/15/2017   Atypical chest pain 06/15/2017   Hyperlipidemia 06/15/2017   Cholecystitis with cholelithiasis 02/27/2016   Increased frequency of urination 01/27/2015   History of night sweats 05/01/2014   Dupuytren's contracture 07/18/2013   Inverted nipple 04/18/2013   Depression 04/23/2010   Fibromyalgia 04/23/2010   Hypercholesterolemia 04/23/2010   Essential hypertension 04/23/2010   Hypothyroidism 04/23/2010   Irritable colon 04/23/2010   Osteoarthritis 04/23/2010    Lewis Moccasin, PT 07/29/2021, 2:36 PM  [place] MAIN [facility] [place] SERVICES [address] [place], [place], [address] Phone: [phone]   Fax:  [phone]  Name: Kendra Benton MRN: [id number] Date of Birth: 1940-12-19

## 2021-07-31 ENCOUNTER — Other Ambulatory Visit: Payer: Self-pay

## 2021-07-31 ENCOUNTER — Ambulatory Visit: Payer: Medicare Other

## 2021-07-31 DIAGNOSIS — R262 Difficulty in walking, not elsewhere classified: Secondary | ICD-10-CM

## 2021-07-31 DIAGNOSIS — R269 Unspecified abnormalities of gait and mobility: Secondary | ICD-10-CM

## 2021-07-31 DIAGNOSIS — M545 Low back pain, unspecified: Secondary | ICD-10-CM | POA: Diagnosis not present

## 2021-07-31 DIAGNOSIS — R2681 Unsteadiness on feet: Secondary | ICD-10-CM

## 2021-07-31 DIAGNOSIS — M6281 Muscle weakness (generalized): Secondary | ICD-10-CM

## 2021-07-31 DIAGNOSIS — G8929 Other chronic pain: Secondary | ICD-10-CM

## 2021-07-31 NOTE — Therapy (Signed)
[place] MAIN [facility] SERVICES [address] [place], [place], [address] Phone: [phone]   Fax:  [phone]  Physical Therapy Treatment/Physical Therapy Progress Note   Dates of reporting period  06/24/2021 to   07/31/2021  Patient Details  Name: Kendra Benton MRN: [id number] Date of Birth: Sep 11, 1940 Referring Provider (PT): Dr. Sharia Reeve   Encounter Date: 07/31/2021   PT End of Session - 07/31/21 1341     Visit Number 10    Number of Visits 25    Date for PT Re-Evaluation 09/16/21    Authorization Time Period 06/24/2021- 09/16/2021    Progress Note Due on Visit 10    PT Start Time 1345    PT Stop Time 1428    PT Time Calculation (min) 43 min    Equipment Utilized During Treatment Gait belt    Activity Tolerance Patient tolerated treatment well;Patient limited by pain    Behavior During Therapy WFL for tasks assessed/performed             Past Medical History:  Diagnosis Date   Allergy    Anxiety    GERD (gastroesophageal reflux disease)    Headache    History of kidney stones    Hypertension    Myocardial infarction [facility])    1997   Osteoporosis    Pneumonia     Past Surgical History:  Procedure Laterality Date   ABDOMINAL HYSTERECTOMY     patient has ovaries   APPENDECTOMY     ARTERY BIOPSY Right 12/30/2017   Procedure: BIOPSY TEMPORAL ARTERY;  Surgeon: Katha Cabal, MD;  Location: [facility] OR[facility];  Service: Vascular;  Laterality: Right;   ARTERY BIOPSY Left 06/18/2018   Procedure: BIOPSY TEMPORAL ARTERY;  Surgeon: Katha Cabal, MD;  Location: [facility];  Service: Vascular;  Laterality: Left;   BACK SURGERY     CARDIAC CATHETERIZATION     CATARACT EXTRACTION W/ INTRAOCULAR LENS  IMPLANT, BILATERAL Bilateral 2010   CHOLECYSTECTOMY N/A 02/28/2016   Procedure: LAPAROSCOPIC CHOLECYSTECTOMY WITH INTRAOPERATIVE CHOLANGIOGRAM;  Surgeon: Dia Crawford III, MD;  Location: [facility] OR[facility];  Service: General;  Laterality: N/A;    LUMBAR FUSION  11/09/2016   L3-L5   SPINE SURGERY     Disc   TONSILLECTOMY      There were no vitals filed for this visit.   Subjective Assessment - 07/31/21 1340     Subjective Patient reports feeling some better- Reports doing more at home. States still only able to be in grocery around 30 min due to back pain.    Pertinent History Patient is a recent former patient who successfully discharged from PT in October of last year focusing on chronic LBP and balance. She met her goals and was managing her pain and able to return to independent level until a recent fall in December- experiencing a different level of low back pain- stil on left side but debilitating and more difficulty with walking and performing ADLS. Patient reports history of chronic LBP with past lumbar fusion surgery on 11/09/2016 and most recent lumbar facet joint block procedure on 06/19/2020. She reports past medical history of spondylosis, Lung Cancer, HTN. She reports increased difficulty performing ADL's yet still independent but has paid services who assist with cleaning.    How long can you sit comfortably? No more than 30 min    How long can you stand comfortably? about 30 min- I have made my grocery trips shorter due to limited standing  How long can you walk comfortably? < 10 min.    Patient Stated Goals Improve my pain and walk better with no falls.    Currently in Pain? Yes    Pain Score 4     Pain Location Back    Pain Orientation Left;Posterior;Lower    Pain Descriptors / Indicators Aching;Sore    Pain Type Chronic pain    Pain Onset More than a month ago    Pain Frequency Constant    Aggravating Factors  Prolonged sitting; bending over    Pain Relieving Factors Medication, heat, Rest, changing positions    Effect of Pain on Daily Activities Difficulty performing ADL's    Multiple Pain Sites No               INTERVENTIONS:    Reassessed STG  - Low back pain goal= 5/10 today current; 6/10 at  worst; 1-2/10 at best with rest lying down.   Long term goals:  -FOTO=66 (improved from 40)  -Low back pain goal= 5/10 today current; 6/10 at worst; 1-2/10 at best with rest lying down. (Improved from 8/10 at worst)   - 5 x STS= 15.0  without UE support ( improved from 29.66 sec)   - Patient reports still limited to around 30 min of grocery store shopping due to low back pain.    -10 MWT: 12.10 and 11.44 sec with SPC (avg= 11.77 sec) = 0.85 m/s (improved from 0.7 m/s)   - TUG: 11.28 sec and 9.48 sec with SPC (avg  10.38 sec) - improved from 18.79 sec using SPC  -Patient able to demo independence with Low back stretching:   Hamstring, knee to chest, lower trunk rotation, bridging       PT Education - 07/31/21 1341     Education Details Patient exercise technique    Person(s) Educated Patient    Methods Explanation;Demonstration;Tactile cues;Verbal cues    Comprehension Verbalized understanding;Returned demonstration;Verbal cues required;Tactile cues required;Need further instruction              PT Short Term Goals - 07/31/21 1333       PT SHORT TERM GOAL #1   Title Pt will decrease worst back pain as reported on NPRS by at least 2 points in order to demonstrate clinically significant reduction in back pain.    Baseline Patient currently reports up to 8/10 Left sided low back pain; 08/01/2021=Low back pain goal= 5/10 today current; 6/10 at worst; 1-2/10 at best with rest lying down.    Time 6    Period Weeks    Status Achieved    Target Date 08/05/21               PT Long Term Goals - 07/31/21 1334       PT LONG TERM GOAL #1   Title Patient will increase FOTO score to equal to or greater than 53 to demonstrate statistically significant improvement in mobility and quality of life.    Baseline 06/24/2021= 40; 07/31/2021= 66    Time 12    Period Weeks    Status Achieved    Target Date 09/16/21      PT LONG TERM GOAL #2   Title Pt will decrease worst back  pain as reported on NPRS by at least 3 points in order to demonstrate clinically significant reduction in back pain.    Baseline 06/24/2021= 8/10 left sided low back pain; 07/31/2021=Low back pain goal= 5/10 today current; 6/10 at worst; 1-2/10 at  best with rest lying down.    Time 12    Period Weeks    Status On-going    Target Date 09/16/21      PT LONG TERM GOAL #3   Title Patient (> 40 years old) will complete five times sit to stand test in <15 seconds indicating an increased LE strength and improved balance.    Baseline 06/25/2021= 29.66 sec without UE support; 07/31/2021= 15 sec without UE support.    Time 12    Period Weeks    Status Partially Met    Target Date 09/16/21      PT LONG TERM GOAL #4   Title Patient will report ability to complete > or equal to 30 min grocery store outing or similar community activiities to return to her previous level of function.    Baseline 06/24/2021= Patient reports decreased outings in community and unable to be in the grocery store longer than 30 min. 07/31/2021=Patient reports still limited to around 30 min of grocery store shopping due to low back pain.    Time 12    Period Weeks    Status On-going    Target Date 09/16/21      PT LONG TERM GOAL #5   Title Patient will increase 10 meter walk test to >1.55ms as to improve gait speed for better community ambulation and to reduce fall risk.    Baseline 06/24/2021= 0.7 m/s with SPC; 07/31/2021= 0.85 m/s with SPC    Time 12    Period Weeks    Status On-going    Target Date 09/16/21      PT LONG TERM GOAL #6   Title Pt will decrease TUG to below 14 seconds/decrease in order to demonstrate decreased fall risk.    Baseline 06/24/2021= 18.79 sec with SPC; 07/31/2021= 10.38 sec with SPC    Time 12    Status Achieved    Target Date 09/16/21                   Plan - 07/31/21 1341     Clinical Impression Statement Patient presents with good motivation today and presents with much improved  functional progress including improved self perceived functional abilities as seen by  FOTO Score; improved overall report of worst back pain (from 8/10 down to 6/10 at worst); Met TUG goal- < 14 sec indicating a significant decreased risk of falling. Patient also making progress with 10 meter walk test and LE strength (improved 5 sec Sit to stand) Patient's condition has the potential to improve in response to therapy. Maximum improvement is yet to be obtained. The anticipated improvement is attainable and reasonable in a generally predictable time.    Patient will benefit from continued skilled PT services to assist with pain relief; restore flexibility and strength for improved mobility and quality of life.    Personal Factors and Comorbidities Age;Comorbidity 3+    Comorbidities Osteoporosis, HTN, CHF    Examination-Activity Limitations Bend;Caring for Others;Carry;Lift;Reach Overhead;Sit;Squat;Stairs;Stand    Examination-Participation Restrictions Cleaning;Community Activity;Laundry;Shop;Yard Work    SProduct manager   Rehab Potential Fair    PT Frequency 2x / week    PT Duration 12 weeks    PT Treatment/Interventions ADLs/Self Care Home Management;Cryotherapy;Moist Heat;DME Instruction;Stair training;Functional mobility training;Therapeutic activities;Therapeutic exercise;Balance training;Neuromuscular re-education;Patient/family education;Manual techniques;Electrical Stimulation;Gait training;Passive range of motion;Dry needling;Vestibular;Canalith Repostioning;Energy conservation;Joint Manipulations;Spinal Manipulations    PT Next Visit Plan Manual therapy/PROM for low back symptoms, balance/core/LE strengthening and training for improved mobility  and function.    PT Home Exercise Plan Reviewed Stretching HEP from previous session.    Consulted and Agree with Plan of Care Patient             Patient will benefit from skilled therapeutic  intervention in order to improve the following deficits and impairments:  Abnormal gait, Decreased balance, Decreased coordination, Decreased activity tolerance, Cardiopulmonary status limiting activity, Decreased endurance, Decreased mobility, Decreased strength, Difficulty walking, Pain, Decreased range of motion, Dizziness, Hypomobility, Impaired flexibility, Improper body mechanics  Visit Diagnosis: Chronic bilateral low back pain without sciatica  Abnormality of gait and mobility  Difficulty in walking, not elsewhere classified  Muscle weakness (generalized)  Unsteadiness on feet     Problem List Patient Active Problem List   Diagnosis Date Noted   Fall (on) (from) other stairs and steps, initial encounter 08/06/2020   Lumbar facet joint syndrome 06/18/2020   Spondylosis without myelopathy or radiculopathy, lumbosacral region 06/18/2020   DDD (degenerative disc disease), lumbosacral 06/18/2020   COVID 06/04/2020   Chronic pain syndrome 05/16/2020   Pharmacologic therapy 05/16/2020   Disorder of skeletal system 05/16/2020   Problems influencing health status 05/16/2020   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   Chronic hip pain (2ry area of Pain) (Bilateral) (R>L) 05/16/2020   Chronic lower extremity pain (Left) 05/16/2020   Numbness and tingling of both feet (intermittent/recurrent) 05/16/2020   Pain in rib (Bilateral) (R>L) (intermittent) 05/16/2020   Chronic knee pain (Left) 05/16/2020   Patellar pain (Left) (chronic, intermittent) 05/16/2020   Hard of hearing 05/16/2020   Carcinoma, lung, right ([place]) 06/25/2019   S/P partial lobectomy of lung 02/25/2019   Mass of upper lobe of right lung 01/06/2019   Chronic nonintractable headache 06/14/2018   Head ache 12/03/2017   Sinus bradycardia 10/06/2017   Chronic low back pain (1ry area of Pain) (Bilateral) (L>R) w/o sciatica 08/17/2017   Panic attack as reaction to stress 08/17/2017   Anxiety 06/15/2017   Atypical  chest pain 06/15/2017   Hyperlipidemia 06/15/2017   Cholecystitis with cholelithiasis 02/27/2016   Increased frequency of urination 01/27/2015   History of night sweats 05/01/2014   Dupuytren's contracture 07/18/2013   Inverted nipple 04/18/2013   Depression 04/23/2010   Fibromyalgia 04/23/2010   Hypercholesterolemia 04/23/2010   Essential hypertension 04/23/2010   Hypothyroidism 04/23/2010   Irritable colon 04/23/2010   Osteoarthritis 04/23/2010    Lewis Moccasin, PT 08/01/2021, 1:46 PM  [place] MAIN [facility] SERVICES [address] [place], [place], [address] Phone: [phone]   Fax:  [phone]  Name: Kendra Benton MRN: [id number] Date of Birth: 04-05-1941

## 2021-08-05 ENCOUNTER — Ambulatory Visit: Payer: Medicare Other

## 2021-08-07 ENCOUNTER — Ambulatory Visit: Payer: Medicare Other

## 2021-08-12 ENCOUNTER — Ambulatory Visit: Payer: Medicare Other | Attending: Internal Medicine

## 2021-08-12 ENCOUNTER — Other Ambulatory Visit: Payer: Self-pay

## 2021-08-12 DIAGNOSIS — M545 Low back pain, unspecified: Secondary | ICD-10-CM | POA: Diagnosis present

## 2021-08-12 DIAGNOSIS — R262 Difficulty in walking, not elsewhere classified: Secondary | ICD-10-CM | POA: Diagnosis present

## 2021-08-12 DIAGNOSIS — M6281 Muscle weakness (generalized): Secondary | ICD-10-CM | POA: Diagnosis present

## 2021-08-12 DIAGNOSIS — R269 Unspecified abnormalities of gait and mobility: Secondary | ICD-10-CM | POA: Diagnosis not present

## 2021-08-12 DIAGNOSIS — G8929 Other chronic pain: Secondary | ICD-10-CM | POA: Diagnosis present

## 2021-08-12 DIAGNOSIS — R2681 Unsteadiness on feet: Secondary | ICD-10-CM | POA: Diagnosis present

## 2021-08-12 DIAGNOSIS — R278 Other lack of coordination: Secondary | ICD-10-CM | POA: Diagnosis present

## 2021-08-12 NOTE — Therapy (Signed)
[place] MAIN [facility] SERVICES [address] [place], [place], [address] Phone: [phone]   Fax:  [phone]  Physical Therapy Treatment  Patient Details  Name: Kendra Benton MRN: [id number] Date of Birth: 1940/11/13 Referring Provider (PT): Dr. Sharia Reeve   Encounter Date: 08/12/2021   PT End of Session - 08/12/21 1434     Visit Number 11    Number of Visits 25    Date for PT Re-Evaluation 09/16/21    Authorization Time Period 06/24/2021- 09/16/2021    Progress Note Due on Visit 10    PT Start Time 1432    PT Stop Time 4656    PT Time Calculation (min) 42 min    Equipment Utilized During Treatment Gait belt    Activity Tolerance Patient tolerated treatment well;Patient limited by pain    Behavior During Therapy WFL for tasks assessed/performed             Past Medical History:  Diagnosis Date   Allergy    Anxiety    GERD (gastroesophageal reflux disease)    Headache    History of kidney stones    Hypertension    Myocardial infarction ([place])    1997   Osteoporosis    Pneumonia     Past Surgical History:  Procedure Laterality Date   ABDOMINAL HYSTERECTOMY     patient has ovaries   APPENDECTOMY     ARTERY BIOPSY Right 12/30/2017   Procedure: BIOPSY TEMPORAL ARTERY;  Surgeon: Katha Cabal, MD;  Location: [facility] ORS;  Service: Vascular;  Laterality: Right;   ARTERY BIOPSY Left 06/18/2018   Procedure: BIOPSY TEMPORAL ARTERY;  Surgeon: Katha Cabal, MD;  Location: [facility] ORS;  Service: Vascular;  Laterality: Left;   BACK SURGERY     CARDIAC CATHETERIZATION     CATARACT EXTRACTION W/ INTRAOCULAR LENS  IMPLANT, BILATERAL Bilateral 2010   CHOLECYSTECTOMY N/A 02/28/2016   Procedure: LAPAROSCOPIC CHOLECYSTECTOMY WITH INTRAOPERATIVE CHOLANGIOGRAM;  Surgeon: Dia Crawford III, MD;  Location: [facility] ORS;  Service: General;  Laterality: N/A;   LUMBAR FUSION  11/09/2016   L3-L5   SPINE SURGERY     Disc   TONSILLECTOMY      There  were no vitals filed for this visit.   Subjective Assessment - 08/12/21 0843     Subjective Patient reports her back is about the same but has been dealing with more lightheadness. Reports feeling okay today. She acknowledges that she has to manage her chronic back pain and agreeable to focus more on her balance today.    Pertinent History Patient is a recent former patient who successfully discharged from PT in October of last year focusing on chronic LBP and balance. She met her goals and was managing her pain and able to return to independent level until a recent fall in December- experiencing a different level of low back pain- stil on left side but debilitating and more difficulty with walking and performing ADLS. Patient reports history of chronic LBP with past lumbar fusion surgery on 11/09/2016 and most recent lumbar facet joint block procedure on 06/19/2020. She reports past medical history of spondylosis, Lung Cancer, HTN. She reports increased difficulty performing ADL's yet still independent but has paid services who assist with cleaning.    How long can you sit comfortably? No more than 30 min    How long can you stand comfortably? about 30 min- I have made my grocery trips shorter due to limited standing  How long can you walk comfortably? < 10 min.    Patient Stated Goals Improve my pain and walk better with no falls.    Currently in Pain? Yes    Pain Score 5     Pain Location Back    Pain Orientation Posterior;Lower    Pain Descriptors / Indicators Aching;Sore    Pain Type Chronic pain    Pain Onset More than a month ago    Pain Frequency Constant    Aggravating Factors  Prolonged sitting, bending over, reaching    Pain Relieving Factors Medication, heat, rest, changing positions, stretching    Effect of Pain on Daily Activities DIfficulty with going out in community for any extended time- grocery shopping               INTERVENTIONS:   Neuromuscular re-ed:   Side  stepping down length of // bars - Focusing on wide steps and no UE support.  Forward stepping over 4 obstacle x length of //bars x 5 (min VC to increase step height/length)  Side step over 4 positioned therabands on the floor in // bars [place] x length of bars and back x 5 - min difficulty coordinating task yet did improve with practice.  Tandem gait - forward with B hand hovering over // bar railing and intermittent touch x 5 down and back.  Dynamic Marching on blue airex pad x 15 reps each LE.   Education provided throughout session via VC/TC and demonstration to facilitate movement at target joints and correct muscle activation for all testing and exercises performed.     Patient arrived with good motivation. Treatment shifted focus to more balance activities today. She responded well to all instruction and improved with her coordination of all task with increased reps and practice. She reports her back was sore yet no worse after today's interventions. Patient will benefit from continued skilled PT services to assist with pain relief; restore flexibility, balance, and strength for improved mobility and quality of life                     PT Education - 08/13/21 0946     Education Details Exercise technique    Person(s) Educated Patient    Methods Explanation;Tactile cues;Demonstration;Verbal cues    Comprehension Verbalized understanding;Returned demonstration;Verbal cues required;Tactile cues required;Need further instruction              PT Short Term Goals - 07/31/21 1333       PT SHORT TERM GOAL #1   Title Pt will decrease worst back pain as reported on NPRS by at least 2 points in order to demonstrate clinically significant reduction in back pain.    Baseline Patient currently reports up to 8/10 Left sided low back pain; 08/01/2021=Low back pain goal= 5/10 today current; 6/10 at worst; 1-2/10 at best with rest lying down.    Time 6    Period Weeks     Status Achieved    Target Date 08/05/21               PT Long Term Goals - 07/31/21 1334       PT LONG TERM GOAL #1   Title Patient will increase FOTO score to equal to or greater than 53 to demonstrate statistically significant improvement in mobility and quality of life.    Baseline 06/24/2021= 40; 07/31/2021= 66    Time 12    Period Weeks    Status Achieved  Target Date 09/16/21      PT LONG TERM GOAL #2   Title Pt will decrease worst back pain as reported on NPRS by at least 3 points in order to demonstrate clinically significant reduction in back pain.    Baseline 06/24/2021= 8/10 left sided low back pain; 07/31/2021=Low back pain goal= 5/10 today current; 6/10 at worst; 1-2/10 at best with rest lying down.    Time 12    Period Weeks    Status On-going    Target Date 09/16/21      PT LONG TERM GOAL #3   Title Patient (> 71 years old) will complete five times sit to stand test in <15 seconds indicating an increased LE strength and improved balance.    Baseline 06/25/2021= 29.66 sec without UE support; 07/31/2021= 15 sec without UE support.    Time 12    Period Weeks    Status Partially Met    Target Date 09/16/21      PT LONG TERM GOAL #4   Title Patient will report ability to complete > or equal to 30 min grocery store outing or similar community activiities to return to her previous level of function.    Baseline 06/24/2021= Patient reports decreased outings in community and unable to be in the grocery store longer than 30 min. 07/31/2021=Patient reports still limited to around 30 min of grocery store shopping due to low back pain.    Time 12    Period Weeks    Status On-going    Target Date 09/16/21      PT LONG TERM GOAL #5   Title Patient will increase 10 meter walk test to >1.77ms as to improve gait speed for better community ambulation and to reduce fall risk.    Baseline 06/24/2021= 0.7 m/s with SPC; 07/31/2021= 0.85 m/s with SPC    Time 12    Period Weeks     Status On-going    Target Date 09/16/21      PT LONG TERM GOAL #6   Title Pt will decrease TUG to below 14 seconds/decrease in order to demonstrate decreased fall risk.    Baseline 06/24/2021= 18.79 sec with SPC; 07/31/2021= 10.38 sec with SPC    Time 12    Status Achieved    Target Date 09/16/21                   Plan - 08/12/21 1435     Clinical Impression Statement Patient arrived with good motivation. Treatment shifted focus to more balance activities today. She responded well to all instruction and improved with her coordination of all task with increased reps and practice. She reports her back was sore yet no worse after today's interventions. Patient will benefit from continued skilled PT services to assist with pain relief; restore flexibility, balance, and strength for improved mobility and quality of life    Personal Factors and Comorbidities Age;Comorbidity 3+    Comorbidities Osteoporosis, HTN, CHF    Examination-Activity Limitations Bend;Caring for Others;Carry;Lift;Reach Overhead;Sit;Squat;Stairs;Stand    Examination-Participation Restrictions Cleaning;Community Activity;Laundry;Shop;Yard Work    SProduct manager   Rehab Potential Fair    PT Frequency 2x / week    PT Duration 12 weeks    PT Treatment/Interventions ADLs/Self Care Home Management;Cryotherapy;Moist Heat;DME Instruction;Stair training;Functional mobility training;Therapeutic activities;Therapeutic exercise;Balance training;Neuromuscular re-education;Patient/family education;Manual techniques;Electrical Stimulation;Gait training;Passive range of motion;Dry needling;Vestibular;Canalith Repostioning;Energy conservation;Joint Manipulations;Spinal Manipulations    PT Next Visit Plan Manual therapy/PROM for low back symptoms, balance/core/LE  strengthening and training for improved mobility and function.    Consulted and Agree with Plan of Care Patient              Patient will benefit from skilled therapeutic intervention in order to improve the following deficits and impairments:  Abnormal gait, Decreased balance, Decreased coordination, Decreased activity tolerance, Cardiopulmonary status limiting activity, Decreased endurance, Decreased mobility, Decreased strength, Difficulty walking, Pain, Decreased range of motion, Dizziness, Hypomobility, Impaired flexibility, Improper body mechanics  Visit Diagnosis: Abnormality of gait and mobility  Difficulty in walking, not elsewhere classified  Muscle weakness (generalized)  Unsteadiness on feet  Chronic bilateral low back pain without sciatica     Problem List Patient Active Problem List   Diagnosis Date Noted   Fall (on) (from) other stairs and steps, initial encounter 08/06/2020   Lumbar facet joint syndrome 06/18/2020   Spondylosis without myelopathy or radiculopathy, lumbosacral region 06/18/2020   DDD (degenerative disc disease), lumbosacral 06/18/2020   COVID 06/04/2020   Chronic pain syndrome 05/16/2020   Pharmacologic therapy 05/16/2020   Disorder of skeletal system 05/16/2020   Problems influencing health status 05/16/2020   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   Chronic hip pain (2ry area of Pain) (Bilateral) (R>L) 05/16/2020   Chronic lower extremity pain (Left) 05/16/2020   Numbness and tingling of both feet (intermittent/recurrent) 05/16/2020   Pain in rib (Bilateral) (R>L) (intermittent) 05/16/2020   Chronic knee pain (Left) 05/16/2020   Patellar pain (Left) (chronic, intermittent) 05/16/2020   Hard of hearing 05/16/2020   Carcinoma, lung, right ([place]) 06/25/2019   S/P partial lobectomy of lung 02/25/2019   Mass of upper lobe of right lung 01/06/2019   Chronic nonintractable headache 06/14/2018   Head ache 12/03/2017   Sinus bradycardia 10/06/2017   Chronic low back pain (1ry area of Pain) (Bilateral) (L>R) w/o sciatica 08/17/2017   Panic attack as reaction to  stress 08/17/2017   Anxiety 06/15/2017   Atypical chest pain 06/15/2017   Hyperlipidemia 06/15/2017   Cholecystitis with cholelithiasis 02/27/2016   Increased frequency of urination 01/27/2015   History of night sweats 05/01/2014   Dupuytren's contracture 07/18/2013   Inverted nipple 04/18/2013   Depression 04/23/2010   Fibromyalgia 04/23/2010   Hypercholesterolemia 04/23/2010   Essential hypertension 04/23/2010   Hypothyroidism 04/23/2010   Irritable colon 04/23/2010   Osteoarthritis 04/23/2010    Lewis Moccasin, PT 08/13/2021, 9:58 AM  [place] MAIN [facility] SERVICES [address] [place], [place], [address] Phone: [phone]   Fax:  [phone]  Name: Kendra Benton MRN: [id number] Date of Birth: 01/08/1941

## 2021-08-14 ENCOUNTER — Ambulatory Visit: Payer: Medicare Other

## 2021-08-19 ENCOUNTER — Ambulatory Visit: Payer: Medicare Other

## 2021-08-21 ENCOUNTER — Ambulatory Visit: Payer: Medicare Other

## 2021-08-21 ENCOUNTER — Other Ambulatory Visit: Payer: Self-pay

## 2021-08-21 DIAGNOSIS — M545 Low back pain, unspecified: Secondary | ICD-10-CM

## 2021-08-21 DIAGNOSIS — R278 Other lack of coordination: Secondary | ICD-10-CM

## 2021-08-21 DIAGNOSIS — R269 Unspecified abnormalities of gait and mobility: Secondary | ICD-10-CM | POA: Diagnosis not present

## 2021-08-21 DIAGNOSIS — R262 Difficulty in walking, not elsewhere classified: Secondary | ICD-10-CM

## 2021-08-21 DIAGNOSIS — M6281 Muscle weakness (generalized): Secondary | ICD-10-CM

## 2021-08-21 DIAGNOSIS — R2681 Unsteadiness on feet: Secondary | ICD-10-CM

## 2021-08-21 NOTE — Therapy (Signed)
[address] ?[place] [facility] ?[place], [place], [address] ?Phone: [phone]   Fax:  [phone] ? ?Physical Therapy Treatment ? ?Patient Details  ?Name: Kendra Benton ?MRN: [id number] ?Date of Birth: 1941/01/19 ?Referring Provider (PT): Dr. Sharia Reeve ? ? ?Encounter Date: 08/21/2021 ? ? PT End of Session - 08/21/21 1442   ? ? Visit Number 12   ? Number of Visits 25   ? Date for PT Re-Evaluation 09/16/21   ? Authorization Time Period 06/24/2021- 09/16/2021   ? Progress Note Due on Visit 10   ? PT Start Time 1434   ? PT Stop Time 0923   ? PT Time Calculation (min) 40 min   ? Equipment Utilized During Treatment Gait belt   ? Activity Tolerance Patient tolerated treatment well;Patient limited by pain   ? Behavior During Therapy [facility] for tasks assessed/performed   ? ?  ?  ? ?  ? ? ?Past Medical History:  ?Diagnosis Date  ? Allergy   ? Anxiety   ? GERD (gastroesophageal reflux disease)   ? Headache   ? History of kidney stones   ? Hypertension   ? Myocardial infarction [facility])   ? 1997  ? Osteoporosis   ? Pneumonia   ? ? ?Past Surgical History:  ?Procedure Laterality Date  ? ABDOMINAL HYSTERECTOMY    ? patient has ovaries  ? APPENDECTOMY    ? ARTERY BIOPSY Right 12/30/2017  ? Procedure: BIOPSY TEMPORAL ARTERY;  Surgeon: Katha Cabal, MD;  Location: [facility] ORS;  Service: Vascular;  Laterality: Right;  ? ARTERY BIOPSY Left 06/18/2018  ? Procedure: BIOPSY TEMPORAL ARTERY;  Surgeon: Katha Cabal, MD;  Location: [facility] ORS;  Service: Vascular;  Laterality: Left;  ? BACK SURGERY    ? CARDIAC CATHETERIZATION    ? CATARACT EXTRACTION W/ INTRAOCULAR LENS  IMPLANT, BILATERAL Bilateral 2010  ? CHOLECYSTECTOMY N/A 02/28/2016  ? Procedure: LAPAROSCOPIC CHOLECYSTECTOMY WITH INTRAOPERATIVE CHOLANGIOGRAM;  Surgeon: Dia Crawford III, MD;  Location: [facility] ORS;  Service: General;  Laterality: N/A;  ? LUMBAR FUSION  11/09/2016  ? L3-L5  ? SPINE SURGERY    ? Disc  ? TONSILLECTOMY    ? ? ?There  were no vitals filed for this visit. ? ? Subjective Assessment - 08/21/21 1440   ? ? Subjective Patient reports feeling about the same- Been off balance some and back pain is about the same- 6/10 today   ? Pertinent History Patient is a recent former patient who successfully discharged from PT in October of last year focusing on chronic LBP and balance. She met her goals and was managing her pain and able to return to independent level until a recent fall in December- experiencing a different level of low back pain- stil on left side but debilitating and more difficulty with walking and performing ADLS. Patient reports history of chronic LBP with past lumbar fusion surgery on 11/09/2016 and most recent lumbar facet joint block procedure on 06/19/2020. She reports past medical history of spondylosis, Lung Cancer, HTN. She reports increased difficulty performing ADL's yet still independent but has paid services who assist with cleaning.   ? How long can you sit comfortably? No more than 30 min   ? How long can you stand comfortably? about 30 min- I have made my grocery trips shorter due to limited standing   ? How long can you walk comfortably? < 10 min.   ? Patient Stated Goals Improve my pain and walk  better with no falls.   ? Currently in Pain? Yes   ? Pain Score 6    ? Pain Location Back   ? Pain Orientation Posterior;Lower   ? Pain Descriptors / Indicators Aching   ? Pain Onset More than a month ago   ? ?  ?  ? ?  ? ? ? ? ? ? ? ? ?INTERVENTIONS:  ? ?Supine (moist heat) to low back while performing supine Low back/LE stretching.  ? ?Bilateral Hamstring - hold 30 sec x 3 ?Bilateral single knee to chest- Hold 30 sec x 3 ?Hip circles - CW/CCW x 20 each direction/each LE ?Piriformis - hold 20 sec x 3 ? ?assessed Leg length as patient was complaining of increased low back (Contralateral to side being stretched). Right was longer than left (upslip on left)  ?So added some muscle energy technique strategies: ?Resisted left  hip ext with contralateral Hip flex resist- hold 5 sec x 10 reps.  ?Resisted Left hip flex with contralateral right hip flex - hold 5 sec x 10 reps.  ?Reassessed Leg length improved to within 2 mm.  ?Long axis distraction to left LE - hold 60 sec x 2.  ?Leg approx equal after manual techniques.  ? ? ? ?Access Code: [facility] ?URL: [email or web address] ?Date: 08/21/2021 ?Prepared by: Sande Brothers ? ?Exercises ?Supine SI Joint Self-Correction - 1 x daily - 3 x weekly - 3 sets - 10 reps ?Pelvic Muscle Energy Technique With Flexion and Extension - 1 x daily - 3 x weekly - 3 sets - 10 reps ? ? ? ? ? ? ? ? ? ? ? ? ? PT Education - 08/21/21 1441   ? ? Education Details Exercise technique   ? Person(s) Educated Patient   ? Methods Explanation;Demonstration;Tactile cues;Verbal cues   ? Comprehension Verbalized understanding;Returned demonstration;Verbal cues required;Tactile cues required;Need further instruction   ? ?  ?  ? ?  ? ? ? PT Short Term Goals - 07/31/21 1333   ? ?  ? PT SHORT TERM GOAL #1  ? Title Pt will decrease worst back pain as reported on NPRS by at least 2 points in order to demonstrate clinically significant reduction in back pain.   ? Baseline Patient currently reports up to 8/10 Left sided low back pain; 08/01/2021=Low back pain goal= 5/10 today current; 6/10 at worst; 1-2/10 at best with rest lying down.   ? Time 6   ? Period Weeks   ? Status Achieved   ? Target Date 08/05/21   ? ?  ?  ? ?  ? ? ? ? PT Long Term Goals - 07/31/21 1334   ? ?  ? PT LONG TERM GOAL #1  ? Title Patient will increase FOTO score to equal to or greater than 53 to demonstrate statistically significant improvement in mobility and quality of life.   ? Baseline 06/24/2021= 40; 07/31/2021= 66   ? Time 12   ? Period Weeks   ? Status Achieved   ? Target Date 09/16/21   ?  ? PT LONG TERM GOAL #2  ? Title Pt will decrease worst back pain as reported on NPRS by at least 3 points in order to demonstrate clinically  significant reduction in back pain.   ? Baseline 06/24/2021= 8/10 left sided low back pain; 07/31/2021=Low back pain goal= 5/10 today current; 6/10 at worst; 1-2/10 at best with rest lying down.   ? Time 12   ? Period Weeks   ?  Status On-going   ? Target Date 09/16/21   ?  ? PT LONG TERM GOAL #3  ? Title Patient (> 50 years old) will complete five times sit to stand test in <15 seconds indicating an increased LE strength and improved balance.   ? Baseline 06/25/2021= 29.66 sec without UE support; 07/31/2021= 15 sec without UE support.   ? Time 12   ? Period Weeks   ? Status Partially Met   ? Target Date 09/16/21   ?  ? PT LONG TERM GOAL #4  ? Title Patient will report ability to complete > or equal to 30 min grocery store outing or similar community activiities to return to her previous level of function.   ? Baseline 06/24/2021= Patient reports decreased outings in community and unable to be in the grocery store longer than 30 min. 07/31/2021=Patient reports still limited to around 30 min of grocery store shopping due to low back pain.   ? Time 12   ? Period Weeks   ? Status On-going   ? Target Date 09/16/21   ?  ? PT LONG TERM GOAL #5  ? Title Patient will increase 10 meter walk test to >1.53ms as to improve gait speed for better community ambulation and to reduce fall risk.   ? Baseline 06/24/2021= 0.7 m/s with SPC; 07/31/2021= 0.85 m/s with SPC   ? Time 12   ? Period Weeks   ? Status On-going   ? Target Date 09/16/21   ?  ? PT LONG TERM GOAL #6  ? Title Pt will decrease TUG to below 14 seconds/decrease in order to demonstrate decreased fall risk.   ? Baseline 06/24/2021= 18.79 sec with SPC; 07/31/2021= 10.38 sec with SPC   ? Time 12   ? Status Achieved   ? Target Date 09/16/21   ? ?  ?  ? ?  ? ? ? ? ? ? ? ? Plan - 08/21/21 1442   ? ? Clinical Impression Statement Patient presented with good motivation but continued ongoing pain. She responded well to manual therapy for pelvic alignment and muscle energy techniques today.  She was able to return demonstration well of new HEP and no increased overall pain today.  Patient will benefit from continued skilled PT services to assist with pain relief; restore flexibility, balance, and

## 2021-08-26 ENCOUNTER — Ambulatory Visit: Payer: Medicare Other

## 2021-08-28 ENCOUNTER — Ambulatory Visit: Payer: Medicare Other

## 2021-08-28 ENCOUNTER — Other Ambulatory Visit: Payer: Self-pay

## 2021-08-28 DIAGNOSIS — R269 Unspecified abnormalities of gait and mobility: Secondary | ICD-10-CM | POA: Diagnosis not present

## 2021-08-28 DIAGNOSIS — M6281 Muscle weakness (generalized): Secondary | ICD-10-CM

## 2021-08-28 DIAGNOSIS — G8929 Other chronic pain: Secondary | ICD-10-CM

## 2021-08-28 DIAGNOSIS — R262 Difficulty in walking, not elsewhere classified: Secondary | ICD-10-CM

## 2021-08-28 DIAGNOSIS — R2681 Unsteadiness on feet: Secondary | ICD-10-CM

## 2021-08-28 DIAGNOSIS — R278 Other lack of coordination: Secondary | ICD-10-CM

## 2021-08-28 NOTE — Therapy (Signed)
[place] [facility] MAIN [facility] SERVICES [address] [place], [place], [address] Phone: [phone]   Fax:  [phone]  Physical Therapy Treatment  Patient Details  Name: Kendra Benton MRN: [id number] Date of Birth: 05/30/1941 Referring Provider (PT): Dr. Tera Helper   Encounter Date: 08/28/2021   PT End of Session - 08/28/21 0916     Visit Number 13    Number of Visits 25    Date for PT Re-Evaluation 09/16/21    Authorization Time Period 06/24/2021- 09/16/2021    Progress Note Due on Visit 10    PT Start Time 1435    PT Stop Time 1514    PT Time Calculation (min) 39 min    Equipment Utilized During Treatment Gait belt    Activity Tolerance Patient tolerated treatment well;Patient limited by pain    Behavior During Therapy WFL for tasks assessed/performed             Past Medical History:  Diagnosis Date   Allergy    Anxiety    GERD (gastroesophageal reflux disease)    Headache    History of kidney stones    Hypertension    Myocardial infarction (HCC)    1997   Osteoporosis    Pneumonia     Past Surgical History:  Procedure Laterality Date   ABDOMINAL HYSTERECTOMY     patient has ovaries   APPENDECTOMY     ARTERY BIOPSY Right 12/30/2017   Procedure: BIOPSY TEMPORAL ARTERY;  Surgeon: Renford Dills, MD;  Location: [facility] ORS;  Service: Vascular;  Laterality: Right;   ARTERY BIOPSY Left 06/18/2018   Procedure: BIOPSY TEMPORAL ARTERY;  Surgeon: Renford Dills, MD;  Location: [facility] ORS;  Service: Vascular;  Laterality: Left;   BACK SURGERY     CARDIAC CATHETERIZATION     CATARACT EXTRACTION W/ INTRAOCULAR LENS  IMPLANT, BILATERAL Bilateral 2010   CHOLECYSTECTOMY N/A 02/28/2016   Procedure: LAPAROSCOPIC CHOLECYSTECTOMY WITH INTRAOPERATIVE CHOLANGIOGRAM;  Surgeon: Tiney Rouge III, MD;  Location: [facility] ORS;  Service: General;  Laterality: N/A;   LUMBAR FUSION  11/09/2016   L3-L5   SPINE SURGERY     Disc   TONSILLECTOMY      There  were no vitals filed for this visit.   Subjective Assessment - 08/28/21 0918     Subjective Patient reports her back is about the same- no worse and denies any falls. States she is able to go on short grocery shopping trips.    Pertinent History Patient is a recent former patient who successfully discharged from PT in October of last year focusing on chronic LBP and balance. She met her goals and was managing her pain and able to return to independent level until a recent fall in December- experiencing a different level of low back pain- stil on left side but debilitating and more difficulty with walking and performing ADLS. Patient reports history of chronic LBP with past lumbar fusion surgery on 11/09/2016 and most recent lumbar facet joint block procedure on 06/19/2020. She reports past medical history of spondylosis, Lung Cancer, HTN. She reports increased difficulty performing ADL's yet still independent but has paid services who assist with cleaning.    How long can you sit comfortably? No more than 30 min    How long can you stand comfortably? about 30 min- I have made my grocery trips shorter due to limited standing    How long can you walk comfortably? < 10 min.    Patient Stated  Goals Improve my pain and walk better with no falls.    Currently in Pain? Yes    Pain Score 5     Pain Location Back    Pain Orientation Posterior;Lower    Pain Descriptors / Indicators Aching;Sore    Pain Type Chronic pain    Pain Onset More than a month ago    Pain Frequency Constant            INTERVENTIONS:   Therapeutic Exercises:   Reviewed Low back and LE stretching: Verbal review and actual demo of the following:   DKC LTR B hamstring Piriformis Patient able to verbalize and demo good form of all above stretching.    Reviewed Core exercises:   TA contract x 10  1/2 dead bug x 10 Dead bug x 10  Bridge x 10  Bridge with alt knee ext x 10  Bridge with Hip abd  x 10  Quadraped  position- hold 1 min Quadraped 1/2 bird dog (Alt LE only) x 10 Quadraped into Child's pose stretch - hold pose x 1 min x 2 trials.     Education provided throughout session via VC/TC and demonstration to facilitate movement at target joints and correct muscle activation for all testing and exercises performed.                     PT Education - 08/29/21 0919     Education Details Core exercise technique    Person(s) Educated Patient    Methods Explanation;Demonstration;Tactile cues;Verbal cues    Comprehension Verbalized understanding;Returned demonstration;Verbal cues required;Tactile cues required;Need further instruction              PT Short Term Goals - 07/31/21 1333       PT SHORT TERM GOAL #1   Title Pt will decrease worst back pain as reported on NPRS by at least 2 points in order to demonstrate clinically significant reduction in back pain.    Baseline Patient currently reports up to 8/10 Left sided low back pain; 08/01/2021=Low back pain goal= 5/10 today current; 6/10 at worst; 1-2/10 at best with rest lying down.    Time 6    Period Weeks    Status Achieved    Target Date 08/05/21               PT Long Term Goals - 07/31/21 1334       PT LONG TERM GOAL #1   Title Patient will increase FOTO score to equal to or greater than 53 to demonstrate statistically significant improvement in mobility and quality of life.    Baseline 06/24/2021= 40; 07/31/2021= 66    Time 12    Period Weeks    Status Achieved    Target Date 09/16/21      PT LONG TERM GOAL #2   Title Pt will decrease worst back pain as reported on NPRS by at least 3 points in order to demonstrate clinically significant reduction in back pain.    Baseline 06/24/2021= 8/10 left sided low back pain; 07/31/2021=Low back pain goal= 5/10 today current; 6/10 at worst; 1-2/10 at best with rest lying down.    Time 12    Period Weeks    Status On-going    Target Date 09/16/21      PT LONG  TERM GOAL #3   Title Patient (> 30 years old) will complete five times sit to stand test in <15 seconds indicating an increased LE strength and  improved balance.    Baseline 06/25/2021= 29.66 sec without UE support; 07/31/2021= 15 sec without UE support.    Time 12    Period Weeks    Status Partially Met    Target Date 09/16/21      PT LONG TERM GOAL #4   Title Patient will report ability to complete > or equal to 30 min grocery store outing or similar community activiities to return to her previous level of function.    Baseline 06/24/2021= Patient reports decreased outings in community and unable to be in the grocery store longer than 30 min. 07/31/2021=Patient reports still limited to around 30 min of grocery store shopping due to low back pain.    Time 12    Period Weeks    Status On-going    Target Date 09/16/21      PT LONG TERM GOAL #5   Title Patient will increase 10 meter walk test to >1.83m/s as to improve gait speed for better community ambulation and to reduce fall risk.    Baseline 06/24/2021= 0.7 m/s with SPC; 07/31/2021= 0.85 m/s with SPC    Time 12    Period Weeks    Status On-going    Target Date 09/16/21      PT LONG TERM GOAL #6   Title Pt will decrease TUG to below 14 seconds/decrease in order to demonstrate decreased fall risk.    Baseline 06/24/2021= 18.79 sec with SPC; 07/31/2021= 10.38 sec with SPC    Time 12    Status Achieved    Target Date 09/16/21                   Plan - 08/28/21 0916     Clinical Impression Statement Patient able to verbalize and demonstrate good technique with all low back and LE stretching. Review some core exercises with patient today and she was able to return demonstration without increased back pain and reminded to perform at least 3x/week Treatment remaining 2 visits with focus on static and dynamic balance and patient in agreement with plan. Patient will benefit from continued skilled PT services to assist with pain relief;  restore flexibility, balance, and strength for improved mobility and quality of life    Personal Factors and Comorbidities Age;Comorbidity 3+    Comorbidities Osteoporosis, HTN, CHF    Examination-Activity Limitations Bend;Caring for Others;Carry;Lift;Reach Overhead;Sit;Squat;Stairs;Stand    Examination-Participation Restrictions Cleaning;Community Activity;Laundry;Shop;Yard Work    Advertising account planner    Rehab Potential Fair    PT Frequency 2x / week    PT Duration 12 weeks    PT Treatment/Interventions ADLs/Self Care Home Management;Cryotherapy;Moist Heat;DME Instruction;Stair training;Functional mobility training;Therapeutic activities;Therapeutic exercise;Balance training;Neuromuscular re-education;Patient/family education;Manual techniques;Electrical Stimulation;Gait training;Passive range of motion;Dry needling;Vestibular;Canalith Repostioning;Energy conservation;Joint Manipulations;Spinal Manipulations    PT Next Visit Plan Manual therapy/PROM for low back symptoms, balance/core/LE strengthening and training for improved mobility and function.    Consulted and Agree with Plan of Care Patient             Patient will benefit from skilled therapeutic intervention in order to improve the following deficits and impairments:  Abnormal gait, Decreased balance, Decreased coordination, Decreased activity tolerance, Cardiopulmonary status limiting activity, Decreased endurance, Decreased mobility, Decreased strength, Difficulty walking, Pain, Decreased range of motion, Dizziness, Hypomobility, Impaired flexibility, Improper body mechanics  Visit Diagnosis: Abnormality of gait and mobility  Difficulty in walking, not elsewhere classified  Muscle weakness (generalized)  Other lack of coordination  Unsteadiness on feet  Chronic bilateral  low back pain without sciatica     Problem List Patient Active Problem List   Diagnosis Date Noted    Fall (on) (from) other stairs and steps, initial encounter 08/06/2020   Lumbar facet joint syndrome 06/18/2020   Spondylosis without myelopathy or radiculopathy, lumbosacral region 06/18/2020   DDD (degenerative disc disease), lumbosacral 06/18/2020   COVID 06/04/2020   Chronic pain syndrome 05/16/2020   Pharmacologic therapy 05/16/2020   Disorder of skeletal system 05/16/2020   Problems influencing health status 05/16/2020   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   Chronic hip pain (2ry area of Pain) (Bilateral) (R>L) 05/16/2020   Chronic lower extremity pain (Left) 05/16/2020   Numbness and tingling of both feet (intermittent/recurrent) 05/16/2020   Pain in rib (Bilateral) (R>L) (intermittent) 05/16/2020   Chronic knee pain (Left) 05/16/2020   Patellar pain (Left) (chronic, intermittent) 05/16/2020   Hard of hearing 05/16/2020   Carcinoma, lung, 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/25/2019   S/P partial lobectomy of lung 02/25/2019   Mass of upper lobe of right lung 01/06/2019   Chronic nonintractable headache 06/14/2018   Head ache 12/03/2017   Sinus bradycardia 10/06/2017   Chronic low back pain (1ry area of Pain) (Bilateral) (L>R) w/o sciatica 08/17/2017   Panic attack as reaction to stress 08/17/2017   Anxiety 06/15/2017   Atypical chest pain 06/15/2017   Hyperlipidemia 06/15/2017   Cholecystitis with cholelithiasis 02/27/2016   Increased frequency of urination 01/27/2015   History of night sweats 05/01/2014   Dupuytren's contracture 07/18/2013   Inverted nipple 04/18/2013   Depression 04/23/2010   Fibromyalgia 04/23/2010   Hypercholesterolemia 04/23/2010   Essential hypertension 04/23/2010   Hypothyroidism 04/23/2010   Irritable colon 04/23/2010   Osteoarthritis 04/23/2010    Kendra Benton, PT 08/29/2021, 9:19 AM  [place] [facility] MAIN [facility] SERVICES [address] [place], [place], [address] Phone: [phone]   Fax:  [phone]  Name:  Kendra Benton MRN: [id number] Date of Birth: 1941-03-06

## 2021-09-02 ENCOUNTER — Ambulatory Visit: Payer: Medicare Other

## 2021-09-04 ENCOUNTER — Ambulatory Visit: Payer: Medicare Other

## 2021-09-04 DIAGNOSIS — R262 Difficulty in walking, not elsewhere classified: Secondary | ICD-10-CM

## 2021-09-04 DIAGNOSIS — R278 Other lack of coordination: Secondary | ICD-10-CM

## 2021-09-04 DIAGNOSIS — M545 Low back pain, unspecified: Secondary | ICD-10-CM

## 2021-09-04 DIAGNOSIS — M6281 Muscle weakness (generalized): Secondary | ICD-10-CM

## 2021-09-04 DIAGNOSIS — R2681 Unsteadiness on feet: Secondary | ICD-10-CM

## 2021-09-04 DIAGNOSIS — R269 Unspecified abnormalities of gait and mobility: Secondary | ICD-10-CM | POA: Diagnosis not present

## 2021-09-04 NOTE — Therapy (Signed)
[place] [facility] ?[place], [place], [address] ?Phone: [phone]   Fax:  [phone] ? ?Physical Therapy Treatment ? ?Patient Details  ?Name: Kendra Benton ?MRN: [id number] ?Date of Birth: [birth year] ?Referring Provider (PT): Dr. Sharia Reeve ? ? ?Encounter Date: 09/04/2021 ? ? PT End of Session - 09/04/21 1436   ? ? Visit Number 14   ? Number of Visits 25   ? Date for PT Re-Evaluation 09/16/21   ? Authorization Time Period 06/24/2021- 09/16/2021   ? Progress Note Due on Visit 10   ? PT Start Time 5974   ? PT Stop Time 1512   ? PT Time Calculation (min) 41 min   ? Equipment Utilized During Treatment Gait belt   ? Activity Tolerance Patient tolerated treatment well;Patient limited by pain   ? Behavior During Therapy [facility] for tasks assessed/performed   ? ?  ?  ? ?  ? ? ?Past Medical History:  ?Diagnosis Date  ? Allergy   ? Anxiety   ? GERD (gastroesophageal reflux disease)   ? Headache   ? History of kidney stones   ? Hypertension   ? Myocardial infarction [facility])   ? 1997  ? Osteoporosis   ? Pneumonia   ? ? ?Past Surgical History:  ?Procedure Laterality Date  ? ABDOMINAL HYSTERECTOMY    ? patient has ovaries  ? APPENDECTOMY    ? ARTERY BIOPSY Right 12/30/2017  ? Procedure: BIOPSY TEMPORAL ARTERY;  Surgeon: Katha Cabal, MD;  Location: [facility] ORS;  Service: Vascular;  Laterality: Right;  ? ARTERY BIOPSY Left 06/18/2018  ? Procedure: BIOPSY TEMPORAL ARTERY;  Surgeon: Katha Cabal, MD;  Location: [facility] ORS;  Service: Vascular;  Laterality: Left;  ? BACK SURGERY    ? CARDIAC CATHETERIZATION    ? CATARACT EXTRACTION W/ INTRAOCULAR LENS  IMPLANT, BILATERAL Bilateral 2010  ? CHOLECYSTECTOMY N/A 02/28/2016  ? Procedure: LAPAROSCOPIC CHOLECYSTECTOMY WITH INTRAOPERATIVE CHOLANGIOGRAM;  Surgeon: Dia Crawford III, MD;  Location: [facility] ORS;  Service: General;  Laterality: N/A;  ? LUMBAR FUSION  11/09/2016  ? L3-L5  ? SPINE SURGERY    ? Disc  ? TONSILLECTOMY    ? ? ?There  were no vitals filed for this visit. ? ? Subjective Assessment - 09/04/21 1432   ? ? Subjective Patient reports that she has been really trying. She states she has been walking around 45 min each day since her last visit. She states she has LBP but managing and using her cane to walk.   ? Pertinent History Patient is a recent former patient who successfully discharged from PT in October of last year focusing on chronic LBP and balance. She met her goals and was managing her pain and able to return to independent level until a recent fall in December- experiencing a different level of low back pain- stil on left side but debilitating and more difficulty with walking and performing ADLS. Patient reports history of chronic LBP with past lumbar fusion surgery on 11/09/2016 and most recent lumbar facet joint block procedure on 06/19/2020. She reports past medical history of spondylosis, Lung Cancer, HTN. She reports increased difficulty performing ADL's yet still independent but has paid services who assist with cleaning.   ? How long can you sit comfortably? No more than 30 min   ? How long can you stand comfortably? about 30 min- I have made my grocery trips shorter due to limited standing   ? How long can  you walk comfortably? < 10 min.   ? Patient Stated Goals Improve my pain and walk better with no falls.   ? Currently in Pain? Yes   ? Pain Score 4    ? Pain Location Back   ? Pain Orientation Posterior;Lower   ? Pain Descriptors / Indicators Aching;Sore   ? Pain Type Chronic pain   ? Pain Onset More than a month ago   ? Pain Frequency Constant   ? Aggravating Factors  Prolonged sitting, bending over, Reaching   ? Pain Relieving Factors Medication, Heat, Rest, Changing positions, Stretching   ? Effect of Pain on Daily Activities Difficulty wtih going out in community for any extended time- grocery shopping   ? ?  ?  ? ?  ? ? ? ?INTERVENTIONS:  ? ?Neuromuscular re-ed:  ? ?Dynamic Marching in march on airex pad without UE  support x 20 sec ? ?Static stand with varying feet position (in corner supported - on airex pad)  ?- feet together- EO, EC, EO with horizontal head turns x multiple attempts ?-feet staggered -EO, EC, EO with horizontal head turns x multiple attempts ?- Feet tandem - EO x 20-25 sec each LEG x muliple attemps ? ?Access Code: [email or web address] ?URL: [email or web address] ?Date: 09/04/2021 ?Prepared by: Sande Brothers ? ?Exercises ?- Romberg Stance Eyes Closed on Foam Pad  - 1 x daily - 3 x weekly - 3 sets - 20 hold ?- Romberg Stance on Foam Pad with Head Rotation  - 1 x daily - 3 x weekly - 3 sets - 20 sec hold ?- Wide Tandem Stance on Foam Pad with Eyes Open  - 1 x daily - 3 x weekly - 3 sets - 20 hold ? ? ? ? ? ? ? ? ? ? ? ? ? ? ? ? ? ? ? ? ? ? ? ? ? ? PT Education - 09/05/21 1319   ? ? Education Details Balance exercise technique   ? Person(s) Educated Patient   ? Methods Explanation;Demonstration;Tactile cues;Verbal cues   ? Comprehension Verbalized understanding;Returned demonstration;Verbal cues required;Tactile cues required;Need further instruction   ? ?  ?  ? ?  ? ? ? PT Short Term Goals - 07/31/21 1333   ? ?  ? PT SHORT TERM GOAL #1  ? Title Pt will decrease worst back pain as reported on NPRS by at least 2 points in order to demonstrate clinically significant reduction in back pain.   ? Baseline Patient currently reports up to 8/10 Left sided low back pain; 08/01/2021=Low back pain goal= 5/10 today current; 6/10 at worst; 1-2/10 at best with rest lying down.   ? Time 6   ? Period Weeks   ? Status Achieved   ? Target Date 08/05/21   ? ?  ?  ? ?  ? ? ? ? PT Long Term Goals - 07/31/21 1334   ? ?  ? PT LONG TERM GOAL #1  ? Title Patient will increase FOTO score to equal to or greater than 53 to demonstrate statistically significant improvement in mobility and quality of life.   ? Baseline 06/24/2021= 40; 07/31/2021= 66   ? Time 12   ? Period Weeks   ? Status Achieved   ? Target Date 09/16/21   ?  ? PT LONG  TERM GOAL #2  ? Title Pt will decrease worst back pain as reported on NPRS by at least 3 points in order to demonstrate clinically significant reduction in back pain.   ?  Baseline 06/24/2021= 8/10 left sided low back pain; 07/31/2021=Low back pain goal= 5/10 today current; 6/10 at worst; 1-2/10 at best with rest lying down.   ? Time 12   ? Period Weeks   ? Status On-going   ? Target Date 09/16/21   ?  ? PT LONG TERM GOAL #3  ? Title Patient (> 85 years old) will complete five times sit to stand test in <15 seconds indicating an increased LE strength and improved balance.   ? Baseline 06/25/2021= 29.66 sec without UE support; 07/31/2021= 15 sec without UE support.   ? Time 12   ? Period Weeks   ? Status Partially Met   ? Target Date 09/16/21   ?  ? PT LONG TERM GOAL #4  ? Title Patient will report ability to complete > or equal to 30 min grocery store outing or similar community activiities to return to her previous level of function.   ? Baseline 06/24/2021= Patient reports decreased outings in community and unable to be in the grocery store longer than 30 min. 07/31/2021=Patient reports still limited to around 30 min of grocery store shopping due to low back pain.   ? Time 12   ? Period Weeks   ? Status On-going   ? Target Date 09/16/21   ?  ? PT LONG TERM GOAL #5  ? Title Patient will increase 10 meter walk test to >1.42ms as to improve gait speed for better community ambulation and to reduce fall risk.   ? Baseline 06/24/2021= 0.7 m/s with SPC; 07/31/2021= 0.85 m/s with SPC   ? Time 12   ? Period Weeks   ? Status On-going   ? Target Date 09/16/21   ?  ? PT LONG TERM GOAL #6  ? Title Pt will decrease TUG to below 14 seconds/decrease in order to demonstrate decreased fall risk.   ? Baseline 06/24/2021= 18.79 sec with SPC; 07/31/2021= 10.38 sec with SPC   ? Time 12   ? Status Achieved   ? Target Date 09/16/21   ? ?  ?  ? ?  ? ? ? ? ? ? ? ? Plan - 09/04/21 1438   ? ? Clinical Impression Statement Patient presented with good  motivation today. She was able to progress to more balance exercises today without report of any increased back pain. She was challenged with unstable surfaces as well as activities with eyes closed.

## 2021-09-09 ENCOUNTER — Ambulatory Visit: Payer: Medicare Other

## 2021-09-11 ENCOUNTER — Ambulatory Visit: Payer: Medicare HMO

## 2021-09-18 ENCOUNTER — Ambulatory Visit: Payer: Medicare Other

## 2021-09-19 ENCOUNTER — Ambulatory Visit: Payer: Medicare HMO

## 2021-09-24 ENCOUNTER — Ambulatory Visit: Payer: Medicare HMO | Attending: Internal Medicine

## 2021-09-24 DIAGNOSIS — M6281 Muscle weakness (generalized): Secondary | ICD-10-CM | POA: Insufficient documentation

## 2021-09-24 DIAGNOSIS — R269 Unspecified abnormalities of gait and mobility: Secondary | ICD-10-CM | POA: Diagnosis present

## 2021-09-24 DIAGNOSIS — R2681 Unsteadiness on feet: Secondary | ICD-10-CM | POA: Diagnosis present

## 2021-09-24 DIAGNOSIS — G8929 Other chronic pain: Secondary | ICD-10-CM | POA: Insufficient documentation

## 2021-09-24 DIAGNOSIS — R262 Difficulty in walking, not elsewhere classified: Secondary | ICD-10-CM | POA: Diagnosis present

## 2021-09-24 DIAGNOSIS — R278 Other lack of coordination: Secondary | ICD-10-CM | POA: Insufficient documentation

## 2021-09-24 DIAGNOSIS — M545 Low back pain, unspecified: Secondary | ICD-10-CM | POA: Diagnosis present

## 2021-09-24 NOTE — Therapy (Signed)
[place] [facility] ?[place], [place], [address] ?Phone: [phone]   Fax:  [phone] ? ?Physical Therapy Treatment/Recertification for dates 09/24/2021- 09/24/2021/Discharge summary ? ?Patient Details  ?Name: Kendra Benton ?MRN: [id number] ?Date of Birth: [birth year] ?Referring Provider (PT): Dr. Sharia Reeve ? ? ?Encounter Date: 09/24/2021 ? ? PT End of Session - 09/24/21 0944   ? ? Visit Number 15   ? Number of Visits 25   ? Date for PT Re-Evaluation 09/24/21   ? Authorization Time Period 06/24/2021- 09/16/2021; 09/24/2021- 09/24/2021   ? Progress Note Due on Visit 10   ? PT Start Time 0930   ? PT Stop Time 1014   ? PT Time Calculation (min) 44 min   ? Equipment Utilized During Treatment Gait belt   ? Activity Tolerance Patient tolerated treatment well;Patient limited by pain   ? Behavior During Therapy [facility] for tasks assessed/performed   ? ?  ?  ? ?  ? ? ?Past Medical History:  ?Diagnosis Date  ? Allergy   ? Anxiety   ? GERD (gastroesophageal reflux disease)   ? Headache   ? History of kidney stones   ? Hypertension   ? Myocardial infarction [facility])   ? 1997  ? Osteoporosis   ? Pneumonia   ? ? ?Past Surgical History:  ?Procedure Laterality Date  ? ABDOMINAL HYSTERECTOMY    ? patient has ovaries  ? APPENDECTOMY    ? ARTERY BIOPSY Right 12/30/2017  ? Procedure: BIOPSY TEMPORAL ARTERY;  Surgeon: Katha Cabal, MD;  Location: [facility] ORS;  Service: Vascular;  Laterality: Right;  ? ARTERY BIOPSY Left 06/18/2018  ? Procedure: BIOPSY TEMPORAL ARTERY;  Surgeon: Katha Cabal, MD;  Location: [facility] ORS;  Service: Vascular;  Laterality: Left;  ? BACK SURGERY    ? CARDIAC CATHETERIZATION    ? CATARACT EXTRACTION W/ INTRAOCULAR LENS  IMPLANT, BILATERAL Bilateral 2010  ? CHOLECYSTECTOMY N/A 02/28/2016  ? Procedure: LAPAROSCOPIC CHOLECYSTECTOMY WITH INTRAOPERATIVE CHOLANGIOGRAM;  Surgeon: Dia Crawford III, MD;  Location: [facility] ORS;  Service: General;  Laterality: N/A;  ?  LUMBAR FUSION  11/09/2016  ? L3-L5  ? SPINE SURGERY    ? Disc  ? TONSILLECTOMY    ? ? ?There were no vitals filed for this visit. ? ? Subjective Assessment - 09/24/21 0938   ? ? Subjective Patient reports that she is continuing to walk using her cane.   ? Pertinent History Patient is a recent former patient who successfully discharged from PT in October of last year focusing on chronic LBP and balance. She met her goals and was managing her pain and able to return to independent level until a recent fall in December- experiencing a different level of low back pain- stil on left side but debilitating and more difficulty with walking and performing ADLS. Patient reports history of chronic LBP with past lumbar fusion surgery on 11/09/2016 and most recent lumbar facet joint block procedure on 06/19/2020. She reports past medical history of spondylosis, Lung Cancer, HTN. She reports increased difficulty performing ADL's yet still independent but has paid services who assist with cleaning.   ? How long can you sit comfortably? No more than 30 min   ? How long can you stand comfortably? about 30 min- I have made my grocery trips shorter due to limited standing   ? How long can you walk comfortably? < 10 min.   ? Patient Stated Goals Improve my pain and walk better  with no falls.   ? Currently in Pain? Yes   ? Pain Score 4    ? Pain Location Back   ? Pain Descriptors / Indicators Aching;Sore;Tightness   ? Pain Onset More than a month ago   ? Pain Frequency Constant   ? Aggravating Factors  prolonged sitting, Bending over, Reaching   ? Pain Relieving Factors Medication, Heat, Rest, Changing positions, Stretching   ? Effect of Pain on Daily Activities Difficulty with prolonged walking   ? ?  ?  ? ?  ? ? ?INTERVENTIONS:  ? ?Reassessed all remaining goals today:   ? ?FOTO: 79% (GOAL MET)  ? ?Worst back pain= 4/10 (GOAL MET)  ? ?5 x STS= 11.53 sec without UE support ? ?>41mn grocery shop= GOAL MET Per patient report ? ?10 Meter walk  test= 1.1 m/s avg today (GOAL MET)  ? ?Review of HEP:  ? ?Access Code: [email or web address]?URL: [email or web address] ?Date: 09/24/2021 ?Prepared by: JSande Brothers? ?Exercises ?- Supine Bridge  - 1 x daily - 3 x weekly - 3 sets - 10 reps ?- Clamshell  - 1 x daily - 3 x weekly - 3 sets - 10 reps ?- Supine Lower Trunk Rotation  - 1 x daily - 7 x weekly - 3 sets - 20-30sec hold ?- Single Leg Stance  - 1 x daily - 3 x weekly - 3 sets - 10sec hold ?- Supine Hamstring Stretch with Strap  - 1 x daily - 7 x weekly - 3 sets - 20-30 hold ?- Supine Figure 4 Piriformis Stretch  - 1 x daily - 7 x weekly - 3 sets - 20-30 hold ? ? ?  ? ? ? ? ? ? ? ? ? ? ? ? ? ? ? ? ? ? ? ? ? PT Education - 09/24/21 0942   ? ? Education Details final Review of HEP   ? Person(s) Educated Patient   ? Methods Explanation;Demonstration;Tactile cues;Verbal cues   ? Comprehension Verbalized understanding;Returned demonstration;Verbal cues required;Tactile cues required;Need further instruction   ? ?  ?  ? ?  ? ? ? PT Short Term Goals - 07/31/21 1333   ? ?  ? PT SHORT TERM GOAL #1  ? Title Pt will decrease worst back pain as reported on NPRS by at least 2 points in order to demonstrate clinically significant reduction in back pain.   ? Baseline Patient currently reports up to 8/10 Left sided low back pain; 08/01/2021=Low back pain goal= 5/10 today current; 6/10 at worst; 1-2/10 at best with rest lying down.   ? Time 6   ? Period Weeks   ? Status Achieved   ? Target Date 08/05/21   ? ?  ?  ? ?  ? ? ? ? PT Long Term Goals - 09/24/21 0946   ? ?  ? PT LONG TERM GOAL #1  ? Title Patient will increase FOTO score to equal to or greater than 53 to demonstrate statistically significant improvement in mobility and quality of life.   ? Baseline 06/24/2021= 40; 07/31/2021= 66; 09/24/2021=79%   ? Time 12   ? Period Weeks   ? Status Achieved   ? Target Date 09/24/21   ?  ? PT LONG TERM GOAL #2  ? Title Pt will decrease worst back pain as reported on NPRS by at  least 3 points in order to demonstrate clinically significant reduction in back pain.   ? Baseline 06/24/2021= 8/10 left sided low back  pain; 07/31/2021=Low back pain goal= 5/10 today current; 6/10 at worst; 1-2/10 at best with rest lying down. 09/24/2021= 4/10   ? Time 12   ? Period Weeks   ? Status Achieved   ? Target Date 09/24/21   ?  ? PT LONG TERM GOAL #3  ? Title Patient (> 97 years old) will complete five times sit to stand test in <15 seconds indicating an increased LE strength and improved balance.   ? Baseline 06/25/2021= 29.66 sec without UE support; 07/31/2021= 15 sec without UE support. 09/24/2021= 11.53 without UE support   ? Time 12   ? Period Weeks   ? Status Achieved   ? Target Date 09/24/21   ?  ? PT LONG TERM GOAL #4  ? Title Patient will report ability to complete > or equal to 30 min grocery store outing or similar community activiities to return to her previous level of function.   ? Baseline 06/24/2021= Patient reports decreased outings in community and unable to be in the grocery store longer than 30 min. 07/31/2021=Patient reports still limited to around 30 min of grocery store shopping due to low back pain. 09/24/2021= Patient reports that she is able to complete > 30 min of grocery outing without significant limitations.   ? Time 12   ? Period Weeks   ? Status Achieved   ? Target Date 09/24/21   ?  ? PT LONG TERM GOAL #5  ? Title Patient will increase 10 meter walk test to >1.48ms as to improve gait speed for better community ambulation and to reduce fall risk.   ? Baseline 06/24/2021= 0.7 m/s with SPC; 07/31/2021= 0.85 m/s with SPC; 09/24/2021= 9.59 sec and 9.23= 1.1 m/s avg   ? Time 12   ? Period Weeks   ? Status Achieved   ? Target Date 09/24/21   ?  ? PT LONG TERM GOAL #6  ? Title Pt will decrease TUG to below 14 seconds/decrease in order to demonstrate decreased fall risk.   ? Baseline 06/24/2021= 18.79 sec with SPC; 07/31/2021= 10.38 sec with SPC   ? Time 12   ? Status Achieved   ? Target Date  09/16/21   ? ?  ?  ? ?  ? ? ? ? ? ? ? ? Plan - 09/24/21 0945   ? ? Clinical Impression Statement Patient presents with good progress toward goals- She has demo progress with FOTO indicating an improvement i

## 2021-09-25 ENCOUNTER — Ambulatory Visit: Payer: Medicare Other

## 2021-10-02 ENCOUNTER — Ambulatory Visit: Payer: Medicare Other

## 2021-10-09 ENCOUNTER — Ambulatory Visit: Payer: Medicare Other

## 2021-10-14 ENCOUNTER — Ambulatory Visit: Payer: Medicare Other

## 2021-10-16 ENCOUNTER — Ambulatory Visit: Payer: Medicare Other

## 2021-10-21 ENCOUNTER — Ambulatory Visit: Payer: Medicare Other

## 2021-10-23 ENCOUNTER — Ambulatory Visit: Payer: Medicare Other

## 2021-10-28 ENCOUNTER — Ambulatory Visit: Payer: Medicare Other

## 2021-10-30 ENCOUNTER — Ambulatory Visit: Payer: Medicare Other

## 2021-11-06 ENCOUNTER — Ambulatory Visit: Payer: Medicare Other

## 2021-11-11 ENCOUNTER — Ambulatory Visit: Payer: Medicare Other

## 2021-11-13 ENCOUNTER — Ambulatory Visit: Payer: Medicare Other

## 2021-11-18 ENCOUNTER — Ambulatory Visit: Payer: Medicare Other

## 2021-11-20 ENCOUNTER — Ambulatory Visit: Payer: Medicare Other

## 2021-11-25 ENCOUNTER — Ambulatory Visit: Payer: Medicare Other

## 2021-11-27 ENCOUNTER — Ambulatory Visit: Payer: Medicare Other

## 2021-12-02 ENCOUNTER — Ambulatory Visit: Payer: Medicare Other

## 2021-12-04 ENCOUNTER — Ambulatory Visit: Payer: Medicare Other

## 2022-09-11 ENCOUNTER — Ambulatory Visit: Payer: Medicare HMO | Attending: Internal Medicine

## 2022-09-11 DIAGNOSIS — G8929 Other chronic pain: Secondary | ICD-10-CM | POA: Diagnosis present

## 2022-09-11 DIAGNOSIS — M545 Low back pain, unspecified: Secondary | ICD-10-CM | POA: Diagnosis present

## 2022-09-11 DIAGNOSIS — M6281 Muscle weakness (generalized): Secondary | ICD-10-CM | POA: Diagnosis present

## 2022-09-11 DIAGNOSIS — R2689 Other abnormalities of gait and mobility: Secondary | ICD-10-CM | POA: Insufficient documentation

## 2022-09-11 DIAGNOSIS — R2681 Unsteadiness on feet: Secondary | ICD-10-CM | POA: Insufficient documentation

## 2022-09-11 DIAGNOSIS — R269 Unspecified abnormalities of gait and mobility: Secondary | ICD-10-CM | POA: Insufficient documentation

## 2022-09-11 DIAGNOSIS — R262 Difficulty in walking, not elsewhere classified: Secondary | ICD-10-CM | POA: Insufficient documentation

## 2022-09-11 DIAGNOSIS — R278 Other lack of coordination: Secondary | ICD-10-CM | POA: Diagnosis present

## 2022-09-11 NOTE — Therapy (Signed)
OUTPATIENT PHYSICAL THERAPY THORACOLUMBAR EVALUATION   Patient Name: Kendra Benton MRN: [id number] DOB:January 10, 1941, 82 y.o., female Today's Date: 09/12/2022  END OF SESSION:  PT End of Session - 09/11/22 1153     Visit Number 1    Number of Visits 24    Date for PT Re-Evaluation 12/04/22    Progress Note Due on Visit 10    PT Start Time 1145    PT Stop Time 1245    PT Time Calculation (min) 60 min    Equipment Utilized During Treatment Gait belt    Activity Tolerance Patient tolerated treatment well;Patient limited by pain    Behavior During Therapy WFL for tasks assessed/performed             Past Medical History:  Diagnosis Date   Allergy    Anxiety    GERD (gastroesophageal reflux disease)    Headache    History of kidney stones    Hypertension    Myocardial infarction    1997   Osteoporosis    Pneumonia    Past Surgical History:  Procedure Laterality Date   ABDOMINAL HYSTERECTOMY     patient has ovaries   APPENDECTOMY     ARTERY BIOPSY Right 12/30/2017   Procedure: BIOPSY TEMPORAL ARTERY;  Surgeon: Renford Dills, MD;  Location: [facility] ORS;  Service: Vascular;  Laterality: Right;   ARTERY BIOPSY Left 06/18/2018   Procedure: BIOPSY TEMPORAL ARTERY;  Surgeon: Renford Dills, MD;  Location: [facility] OR[facility];  Service: Vascular;  Laterality: Left;   BACK SURGERY     CARDIAC CATHETERIZATION     CATARACT EXTRACTION W/ INTRAOCULAR LENS  IMPLANT, BILATERAL Bilateral 2010   CHOLECYSTECTOMY N/A 02/28/2016   Procedure: LAPAROSCOPIC CHOLECYSTECTOMY WITH INTRAOPERATIVE CHOLANGIOGRAM;  Surgeon: Tiney Rouge III, MD;  Location: [facility] OR[facility];  Service: General;  Laterality: N/A;   LUMBAR FUSION  11/09/2016   L3-L5   SPINE SURGERY     Disc   TONSILLECTOMY     Patient Active Problem List   Diagnosis Date Noted   Fall (on) (from) other stairs and steps, initial encounter 08/06/2020   Lumbar facet joint syndrome 06/18/2020   Spondylosis without myelopathy or radiculopathy,  lumbosacral region 06/18/2020   DDD (degenerative disc disease), lumbosacral 06/18/2020   COVID 06/04/2020   Chronic pain syndrome 05/16/2020   Pharmacologic therapy 05/16/2020   Disorder of skeletal system 05/16/2020   Problems influencing health status 05/16/2020   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   Chronic hip pain (2ry area of Pain) (Bilateral) (R>L) 05/16/2020   Chronic lower extremity pain (Left) 05/16/2020   Numbness and tingling of both feet (intermittent/recurrent) 05/16/2020   Pain in rib (Bilateral) (R>L) (intermittent) 05/16/2020   Chronic knee pain (Left) 05/16/2020   Patellar pain (Left) (chronic, intermittent) 05/16/2020   Hard of hearing 05/16/2020   Carcinoma, lung, right 06/25/2019   S/P partial lobectomy of lung 02/25/2019   Mass of upper lobe of right lung 01/06/2019   Chronic nonintractable headache 06/14/2018   Head ache 12/03/2017   Sinus bradycardia 10/06/2017   Chronic low back pain (1ry area of Pain) (Bilateral) (L>R) w/o sciatica 08/17/2017   Panic attack as reaction to stress 08/17/2017   Anxiety 06/15/2017   Atypical chest pain 06/15/2017   Hyperlipidemia 06/15/2017   Cholecystitis with cholelithiasis 02/27/2016   Increased frequency of urination 01/27/2015   History of night sweats 05/01/2014   Dupuytren's contracture 07/18/2013   Inverted nipple 04/18/2013   Depression 04/23/2010   Fibromyalgia 04/23/2010  Hypercholesterolemia 04/23/2010   Essential hypertension 04/23/2010   Hypothyroidism 04/23/2010   Irritable colon 04/23/2010   Osteoarthritis 04/23/2010    PCP: Cain Sieve, MD  REFERRING PROVIDER: Cain Sieve, MD  REFERRING DIAG: Chronic low back pain; At risk for falls  Rationale for Evaluation and Treatment: Rehabilitation  THERAPY DIAG:  Abnormality of gait and mobility  Difficulty in walking, not elsewhere classified  Muscle weakness (generalized)  Other lack of coordination  Unsteadiness on  feet  Chronic bilateral low back pain without sciatica  Chronic left-sided low back pain without sciatica  Other abnormalities of gait and mobility  ONSET DATE: chronic but worse since Jan 2024  SUBJECTIVE:                                                                                                                                                                                           SUBJECTIVE STATEMENT: Patient reports she is back with increased chronic low back pain. States now having to take a oxycodone as needed with some relief and looking to see if PT will help as it has in the past  PERTINENT HISTORY:  Patient is a 82 year old female with diagnosis of chronic low back pain with scoliosis and degenerative lumbar spine. She has received PT in the past for condition with positive results and currently using pain management strategies including pain meds, Heat, Exercises, Yoga, lidocain patch, diclofenac topical gel, and biofreeze. She was HTN controlled with medication.  She reports still lives alone but having trouble with community and social outings and having to modify how she performs her ADLs and housework due to her chronic pain. PMH- Spondylosis, Scoliosis, HTN, Lung Ca  PAIN:  Are you having pain? Yes: NPRS scale: 8/10 Pain location: mid to low back - Bilateral LE  Pain description: ache with sharp pain with movement- constant Aggravating factors: prolonged sitting and standing, walking Relieving factors: Changing positions, heat, yoga, pain meds  PRECAUTIONS: Fall  WEIGHT BEARING RESTRICTIONS: No  FALLS:  Has patient fallen in last 6 months? No- However patient reports some balance issues and near falls- using cane with community outings  LIVING ENVIRONMENT: Lives with: lives alone Lives in: House/apartment Stairs: Yes: External: 6 steps; on right going up Has following equipment at home: Single point cane, Walker - 2 wheeled, and shower chair  OCCUPATION:  RetiredArt gallery manager at [employer]  PLOF: Independent  PATIENT GOALS: To decrease my pain  NEXT MD VISIT: 10/06/2022- Dr. Waylan Boga at [facility]- for right hand  OBJECTIVE:   DIAGNOSTIC FINDINGS:  mpressions  08/13/2022 1:21 PM EST   1. Levoscoliosis centered at T12 L2.  2. L3-L5 posterior fusion hardware without hardware complication. 3. Severe adjacent segment degenerative changes at L2-L3 which are progressed from prior with complete obliteration of the disc space.     Imaging Results - XR Scoliosis AP And Lateral (08/13/2022 11:48 AM EST) Narrative  08/13/2022 1:21 PM EST  EXAM: XR SCOLIOSIS AP AND LATERAL DATE: 08/13/2022 11:48 AM ACCESSION: [id number] UN DICTATED: 08/13/2022 1:18 PM INTERPRETATION LOCATION: [facility]  CLINICAL INDICATION: 82 years old Female with lumbar spine pain  - M47.816 - Spondylosis of lumbar region without myelopathy or radiculopathy    COMPARISON: 09/28/2017 lumbar spine radiographs  TECHNIQUE: Standing AP and lateral views of the spine.  FINDINGS: There are 12 rib-bearing thoracic vertebrae and 5 nonrib-bearing lumbar vertebrae. Redemonstrated L3-L5 posterior fusion hardware. Levoscoliosis centered at T12-L1. Rightward coronal imbalance. Neutral sagittal balance. Redemonstrated compression deformity at L2. Significantly progressed adjacent segment degenerative changes at L2-L3 with complete obliteration of the L2-L3 disc space. Mild bilateral hip osteoarthrosis. Lungs are clear.     Imaging Results - XR Scoliosis AP And Lateral (08/13/2022 11:48 AM EST) Procedure Note  Hadley Pen, MD - 08/13/2022  Formatting of this note might be different from the original. EXAM: XR SCOLIOSIS AP AND LATERAL DATE: 08/13/2022 11:48 AM ACCESSION: [id number] UN DICTATED: 08/13/2022 1:18 PM INTERPRETATION LOCATION: [facility]  CLINICAL INDICATION: 82 years old Female with lumbar spine pain - M47.816 - Spondylosis of lumbar region without myelopathy or  radiculopathy  COMPARISON: 09/28/2017 lumbar spine radiographs  TECHNIQUE: Standing AP and lateral views of the spine.  FINDINGS: There are 12 rib-bearing thoracic vertebrae and 5 nonrib-bearing lumbar vertebrae. Redemonstrated L3-L5 posterior fusion hardware. Levoscoliosis centered at T12-L1. Rightward coronal imbalance. Neutral sagittal balance. Redemonstrated compression deformity at L2. Significantly progressed adjacent segment degenerative changes at L2-L3 with complete obliteration of the L2-L3 disc space. Mild bilateral hip osteoarthrosis. Lungs are clear.  IMPRESSION:  1. Levoscoliosis centered at T12 L2. 2. L3-L5 posterior fusion hardware without hardware complication. 3. Severe adjacent segment degenerative changes at L2-L3 which are progressed from prior with complete obliteration of the disc space.    PATIENT SURVEYS:  Modified Oswestry 58%  FOTO 54/goal predicted of 55  SCREENING FOR RED FLAGS: Bowel or bladder incontinence: No Spinal tumors: No Cauda equina syndrome: No Compression fracture: No Abdominal aneurysm: No  COGNITION: Overall cognitive status: Within functional limits for tasks assessed     SENSATION: WFL  MUSCLE LENGTH: Hamstrings: Right 90+ deg; Left 90+ deg  POSTURE:  lateral lumbar scoliosis  PALPATION: T10- L5 tender to touch  LUMBAR ROM:   AROM eval  Flexion Fingertip to floor- *  Extension WNL*  Right lateral flexion *Finger tip just below fib head  Left lateral flexion *Finger tip just below fib head  Right rotation WNL  Left rotation WNL   (Blank rows = not tested) (*= painful)   LOWER EXTREMITY ROM:     All LE ROM within normal limits  LOWER EXTREMITY MMT:    MMT Right eval Left eval  Hip flexion 4 4  Hip extension 4+ 4+  Hip abduction 4 4  Hip adduction 4 4  Hip internal rotation 4 4  Hip external rotation 4 4  Knee flexion 4 4  Knee extension 4 4  Ankle dorsiflexion 4 4  Ankle plantarflexion 4 4  Ankle  inversion    Ankle eversion     (Blank rows = not tested)  LUMBAR SPECIAL TESTS:  Straight leg raise test: Negative and Slump test: Negative  FUNCTIONAL  TESTS:  5 times sit to stand: 20.33 6 minute walk test: Will assess next visit 10 meter walk test: 10.11 and 10.43 sec= 0.97 m/s  Berg Balance Scale: To be assessed next visit  GAIT: Distance walked: 100 feet Assistive device utilized: Single point cane Level of assistance: SBA Comments: - pain with SPC- mild unsteadiness = short shuffling gait  TODAY'S TREATMENT:                                                                                                                              DATE: 09/11/2022  PT EVALUATION ONLY TODAY    PATIENT EDUCATION:  Education details: PT plan of care; discussion of anatomy and how scoliosis affects the low back musculature.  Person educated: Patient Education method: Explanation, Demonstration, Tactile cues, and Verbal cues Education comprehension: verbalized understanding, returned demonstration, verbal cues required, and tactile cues required  HOME EXERCISE PROGRAM: To be initiated/Reviewed from previous episode of care next 1-2 visits.   ASSESSMENT:  CLINICAL IMPRESSION: Patient is a 82 y.o. female who was seen today for physical therapy evaluation and treatment for Chronic low back pain and risk for falling.  Patient presents today with constant low back pain and spinal deformity (levoscoliosis) affecting her mobility and daily life. She also presents with some bilateral LE muscle weakness and imbalance with risk for falling- will formally assess balance more next visit. She is motivated to return to PT as she has had some success with pain management with PT interventions. Patient will benefit from skilled PT services to improve her low back pain and functional strength and decrease her risk of falling while improving her quality of life.   OBJECTIVE IMPAIRMENTS: Abnormal gait, decreased  activity tolerance, decreased balance, decreased endurance, decreased mobility, difficulty walking, decreased strength, and pain.   ACTIVITY LIMITATIONS: carrying, lifting, bending, sitting, standing, squatting, sleeping, stairs, transfers, and bed mobility  PARTICIPATION LIMITATIONS: cleaning, laundry, shopping, community activity, and yard work  PERSONAL FACTORS: Age, Time since onset of injury/illness/exacerbation, and 3+ comorbidities: scoliosis, HTN, degenerative spine  are also affecting patient's functional outcome.   REHAB POTENTIAL: Good  CLINICAL DECISION MAKING: Stable/uncomplicated  EVALUATION COMPLEXITY: Moderate   GOALS: Goals reviewed with patient? Yes  SHORT TERM GOALS: Target date: 10/24/2022  Pt will decrease worst back pain as reported on NPRS by at least 2 points in order to demonstrate clinically significant reduction in back pain.  Baseline: Eval= 8/10 Goal status: INITIAL  LONG TERM GOALS: Target date: 12/04/2022  Pt will be independent with Final HEP in order to improve strength and decrease back pain in order to improve pain-free function at home and work.  Baseline: EVAL= Patient reports needs update and review of exercises to assist with her back pain Goal status: INITIAL  2.  Pt will decrease worst back pain as reported on NPRS by at least 3 points in order to demonstrate clinically significant reduction in back pain.  Baseline: EVAL= 8/10  Goal status: INITIAL  3.  Pt will decrease mODI score by at least 13 points in order demonstrate clinically significant reduction in back pain/disability.   Baseline: EVAL=58% Goal status: INITIAL  4.  Pt will improve FOTO to target score of >55 to display perceived improvements in ability to complete ADL's.  Baseline: EVAL= 54 Goal status: INITIAL  5.  Patient (> 9 years old) will complete five times sit to stand test in <15 seconds indicating an increased LE strength and improved balance.  Baseline: EVAL =  20.33 Goal status: INITIAL  6.  Pt will increase by at least 0.13 m/s in order to demonstrate clinically significant improvement in community ambulation.   Baseline: EVAL = 0.97 m/s Goal status: INITIAL 7.  Patient will report ability to complete > or equal to 60 min grocery store outing or similar community activiities to return to her previous level of function.  Baseline: EVAL: patient reports difficulty tolerating outings in community. Goal status: INITIAL PLAN:  PT FREQUENCY: 1-2x/week  PT DURATION: 12 weeks  PLANNED INTERVENTIONS: Therapeutic exercises, Therapeutic activity, Neuromuscular re-education, Balance training, Gait training, Patient/Family education, Self Care, Joint mobilization, Joint manipulation, Stair training, Vestibular training, Canalith repositioning, Orthotic/Fit training, DME instructions, Dry Needling, Electrical stimulation, Spinal manipulation, Spinal mobilization, Cryotherapy, Moist heat, scar mobilization, Taping, and Manual therapy.  PLAN FOR NEXT SESSION: Assess balance- BERG of FGA; Reivew HEP and add as appropriate.    Lenda Kelp, PT 09/12/2022, 7:56 AM

## 2022-09-15 ENCOUNTER — Ambulatory Visit: Payer: Medicare HMO

## 2022-09-16 ENCOUNTER — Ambulatory Visit: Payer: Medicare HMO

## 2022-09-16 DIAGNOSIS — G8929 Other chronic pain: Secondary | ICD-10-CM

## 2022-09-16 DIAGNOSIS — M6281 Muscle weakness (generalized): Secondary | ICD-10-CM

## 2022-09-16 DIAGNOSIS — R278 Other lack of coordination: Secondary | ICD-10-CM

## 2022-09-16 DIAGNOSIS — M545 Low back pain, unspecified: Secondary | ICD-10-CM

## 2022-09-16 DIAGNOSIS — R2681 Unsteadiness on feet: Secondary | ICD-10-CM

## 2022-09-16 DIAGNOSIS — R269 Unspecified abnormalities of gait and mobility: Secondary | ICD-10-CM

## 2022-09-16 DIAGNOSIS — R262 Difficulty in walking, not elsewhere classified: Secondary | ICD-10-CM

## 2022-09-16 DIAGNOSIS — R2689 Other abnormalities of gait and mobility: Secondary | ICD-10-CM

## 2022-09-16 NOTE — Therapy (Signed)
OUTPATIENT PHYSICAL THERAPY THORACOLUMBAR VISIT   Patient Name: Kendra Benton MRN: [id number] DOB:02/12/1941, 82 y.o., female Today's Date: 09/16/2022  END OF SESSION:  PT End of Session - 09/16/22 0857     Visit Number 2    Number of Visits 24    Date for PT Re-Evaluation 12/04/22    Progress Note Due on Visit 10    PT Start Time 0845    Equipment Utilized During Treatment Gait belt    Activity Tolerance Patient tolerated treatment well;Patient limited by pain    Behavior During Therapy WFL for tasks assessed/performed             Past Medical History:  Diagnosis Date   Allergy    Anxiety    GERD (gastroesophageal reflux disease)    Headache    History of kidney stones    Hypertension    Myocardial infarction    1997   Osteoporosis    Pneumonia    Past Surgical History:  Procedure Laterality Date   ABDOMINAL HYSTERECTOMY     patient has ovaries   APPENDECTOMY     ARTERY BIOPSY Right 12/30/2017   Procedure: BIOPSY TEMPORAL ARTERY;  Surgeon: Renford Dills, MD;  Location: [facility];  Service: Vascular;  Laterality: Right;   ARTERY BIOPSY Left 06/18/2018   Procedure: BIOPSY TEMPORAL ARTERY;  Surgeon: Renford Dills, MD;  Location: [facility];  Service: Vascular;  Laterality: Left;   BACK SURGERY     CARDIAC CATHETERIZATION     CATARACT EXTRACTION W/ INTRAOCULAR LENS  IMPLANT, BILATERAL Bilateral 2010   CHOLECYSTECTOMY N/A 02/28/2016   Procedure: LAPAROSCOPIC CHOLECYSTECTOMY WITH INTRAOPERATIVE CHOLANGIOGRAM;  Surgeon: Tiney Rouge III, MD;  Location: [facility];  Service: General;  Laterality: N/A;   LUMBAR FUSION  11/09/2016   L3-L5   SPINE SURGERY     Disc   TONSILLECTOMY     Patient Active Problem List   Diagnosis Date Noted   Fall (on) (from) other stairs and steps, initial encounter 08/06/2020   Lumbar facet joint syndrome 06/18/2020   Spondylosis without myelopathy or radiculopathy, lumbosacral region 06/18/2020   DDD (degenerative disc disease),  lumbosacral 06/18/2020   COVID 06/04/2020   Chronic pain syndrome 05/16/2020   Pharmacologic therapy 05/16/2020   Disorder of skeletal system 05/16/2020   Problems influencing health status 05/16/2020   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   Chronic hip pain (2ry area of Pain) (Bilateral) (R>L) 05/16/2020   Chronic lower extremity pain (Left) 05/16/2020   Numbness and tingling of both feet (intermittent/recurrent) 05/16/2020   Pain in rib (Bilateral) (R>L) (intermittent) 05/16/2020   Chronic knee pain (Left) 05/16/2020   Patellar pain (Left) (chronic, intermittent) 05/16/2020   Hard of hearing 05/16/2020   Carcinoma, lung, right 06/25/2019   S/P partial lobectomy of lung 02/25/2019   Mass of upper lobe of right lung 01/06/2019   Chronic nonintractable headache 06/14/2018   Head ache 12/03/2017   Sinus bradycardia 10/06/2017   Chronic low back pain (1ry area of Pain) (Bilateral) (L>R) w/o sciatica 08/17/2017   Panic attack as reaction to stress 08/17/2017   Anxiety 06/15/2017   Atypical chest pain 06/15/2017   Hyperlipidemia 06/15/2017   Cholecystitis with cholelithiasis 02/27/2016   Increased frequency of urination 01/27/2015   History of night sweats 05/01/2014   Dupuytren's contracture 07/18/2013   Inverted nipple 04/18/2013   Depression 04/23/2010   Fibromyalgia 04/23/2010   Hypercholesterolemia 04/23/2010   Essential hypertension 04/23/2010   Hypothyroidism 04/23/2010   Irritable  colon 04/23/2010   Osteoarthritis 04/23/2010    PCP: Cain Sieve, MD  REFERRING PROVIDER: Cain Sieve, MD  REFERRING DIAG: Chronic low back pain; At risk for falls  Rationale for Evaluation and Treatment: Rehabilitation  THERAPY DIAG:  Abnormality of gait and mobility  Difficulty in walking, not elsewhere classified  Muscle weakness (generalized)  Other lack of coordination  Unsteadiness on feet  Chronic bilateral low back pain without sciatica  Other  abnormalities of gait and mobility  ONSET DATE: chronic but worse since Jan 2024  SUBJECTIVE:                                                                                                                                                                                           SUBJECTIVE STATEMENT: Patient reports pain is not too bad this early in the morning. States her pain is around 4/10  PERTINENT HISTORY:  Patient is a 82 year old female with diagnosis of chronic low back pain with scoliosis and degenerative lumbar spine. She has received PT in the past for condition with positive results and currently using pain management strategies including pain meds, Heat, Exercises, Yoga, lidocain patch, diclofenac topical gel, and biofreeze. She was HTN controlled with medication.  She reports still lives alone but having trouble with community and social outings and having to modify how she performs her ADLs and housework due to her chronic pain. PMH- Spondylosis, Scoliosis, HTN, Lung Ca  PAIN:  Are you having pain? Yes: NPRS scale: 4/10 Pain location: mid to low back - Bilateral LE  Pain description: ache with sharp pain with movement- constant Aggravating factors: prolonged sitting and standing, walking Relieving factors: Changing positions, heat, yoga, pain meds  PRECAUTIONS: Fall  WEIGHT BEARING RESTRICTIONS: No  FALLS:  Has patient fallen in last 6 months? No- However patient reports some balance issues and near falls- using cane with community outings  LIVING ENVIRONMENT: Lives with: lives alone Lives in: House/apartment Stairs: Yes: External: 6 steps; on right going up Has following equipment at home: Single point cane, Walker - 2 wheeled, and shower chair  OCCUPATION: RetiredArt gallery manager at [employer]  PLOF: Independent  PATIENT GOALS: To decrease my pain  NEXT MD VISIT: 10/06/2022- Dr. Waylan Boga at [facility]- for right hand  OBJECTIVE:   DIAGNOSTIC FINDINGS:  mpressions   08/13/2022 1:21 PM EST   1. Levoscoliosis centered at T12 L2. 2. L3-L5 posterior fusion hardware without hardware complication. 3. Severe adjacent segment degenerative changes at L2-L3 which are progressed from prior with complete obliteration of the disc space.     Imaging Results - XR Scoliosis AP And Lateral (08/13/2022  11:48 AM EST) Narrative  08/13/2022 1:21 PM EST  EXAM: XR SCOLIOSIS AP AND LATERAL DATE: 08/13/2022 11:48 AM ACCESSION: [id number] UN DICTATED: 08/13/2022 1:18 PM [facility]PRETATION LOCATION: [facility]  CLINICAL INDICATION: 82 years old Female with lumbar spine pain  - M47.816 - Spondylosis of lumbar region without myelopathy or radiculopathy    COMPARISON: 09/28/2017 lumbar spine radiographs  TECHNIQUE: Standing AP and lateral views of the spine.  FINDINGS: There are 12 rib-bearing thoracic vertebrae and 5 nonrib-bearing lumbar vertebrae. Redemonstrated L3-L5 posterior fusion hardware. Levoscoliosis centered at T12-L1. Rightward coronal imbalance. Neutral sagittal balance. Redemonstrated compression deformity at L2. Significantly progressed adjacent segment degenerative changes at L2-L3 with complete obliteration of the L2-L3 disc space. Mild bilateral hip osteoarthrosis. Lungs are clear.     Imaging Results - XR Scoliosis AP And Lateral (08/13/2022 11:48 AM EST) Procedure Note  Hadley Pen, MD - 08/13/2022  Formatting of this note might be different from the original. EXAM: XR SCOLIOSIS AP AND LATERAL DATE: 08/13/2022 11:48 AM ACCESSION: [id number] UN DICTATED: 08/13/2022 1:18 PM INTERPRETATION LOCATION: [facility]  CLINICAL INDICATION: 82 years old Female with lumbar spine pain - M47.816 - Spondylosis of lumbar region without myelopathy or radiculopathy  COMPARISON: 09/28/2017 lumbar spine radiographs  TECHNIQUE: Standing AP and lateral views of the spine.  FINDINGS: There are 12 rib-bearing thoracic vertebrae and 5 nonrib-bearing lumbar vertebrae.  Redemonstrated L3-L5 posterior fusion hardware. Levoscoliosis centered at T12-L1. Rightward coronal imbalance. Neutral sagittal balance. Redemonstrated compression deformity at L2. Significantly progressed adjacent segment degenerative changes at L2-L3 with complete obliteration of the L2-L3 disc space. Mild bilateral hip osteoarthrosis. Lungs are clear.  IMPRESSION:  1. Levoscoliosis centered at T12 L2. 2. L3-L5 posterior fusion hardware without hardware complication. 3. Severe adjacent segment degenerative changes at L2-L3 which are progressed from prior with complete obliteration of the disc space.    PATIENT SURVEYS:  Modified Oswestry 58%  FOTO 54/goal predicted of 55  SCREENING FOR RED FLAGS: Bowel or bladder incontinence: No Spinal tumors: No Cauda equina syndrome: No Compression fracture: No Abdominal aneurysm: No  COGNITION: Overall cognitive status: Within functional limits for tasks assessed     SENSATION: WFL  MUSCLE LENGTH: Hamstrings: Right 90+ deg; Left 90+ deg  POSTURE:  lateral lumbar scoliosis  PALPATION: T10- L5 tender to touch  LUMBAR ROM:   AROM eval  Flexion Fingertip to floor- *  Extension WNL*  Right lateral flexion *Finger tip just below fib head  Left lateral flexion *Finger tip just below fib head  Right rotation WNL  Left rotation WNL   (Blank rows = not tested) (*= painful)   LOWER EXTREMITY ROM:     All LE ROM within normal limits  LOWER EXTREMITY MMT:    MMT Right eval Left eval  Hip flexion 4 4  Hip extension 4+ 4+  Hip abduction 4 4  Hip adduction 4 4  Hip internal rotation 4 4  Hip external rotation 4 4  Knee flexion 4 4  Knee extension 4 4  Ankle dorsiflexion 4 4  Ankle plantarflexion 4 4  Ankle inversion    Ankle eversion     (Blank rows = not tested)  LUMBAR SPECIAL TESTS:  Straight leg raise test: Negative and Slump test: Negative  FUNCTIONAL TESTS:  5 times sit to stand: 20.33 6 minute walk test: Will  assess next visit 10 meter walk test: 10.11 and 10.43 sec= 0.97 m/s  Berg Balance Scale: To be assessed next visit  GAIT: Distance walked: 100 feet  Assistive device utilized: Single point cane Level of assistance: SBA Comments: - pain with SPC- mild unsteadiness = short shuffling gait  TODAY'S TREATMENT:                                                                                                                              DATE: 09/16/2022  Moist heat to low back while in supine while performing low back and LE exercises.  BP: 155/58 mmHg Left UE (sitting) and 144/48 mmHg standing. No symptoms in clinic today.    Manual therapy:  30 sec hold each LE x 3 each Knee to chest Hamstring Sciatic nerve flossing  Figure 4 Piriformis Long axis LE Distraction Lower trunk rotation  Therex:  Lower trunk rotation x 15 reps each Quadraped cat/camel x 15 reps (as painfree as possible) Prayer stretch on mat- Hold 60 sec x 3 Alt UE/LE Bird dog with core tightening- x 12 reps each   PATIENT EDUCATION:  Education details: PT plan of care; discussion of anatomy and how scoliosis affects the low back musculature.  Person educated: Patient Education method: Explanation, Demonstration, Tactile cues, and Verbal cues Education comprehension: verbalized understanding, returned demonstration, verbal cues required, and tactile cues required  HOME EXERCISE PROGRAM: To be initiated/Reviewed from previous episode of care next 1-2 visits.   ASSESSMENT:  CLINICAL IMPRESSION: Patient presents with ongoing chronic low back pain- Some tightness in B hips/ low back and responded well to manual stretching. Deferred balance assessment due to reported dizziness earlier in morning. BP did decrease with standing but not orthostatic numbers. Reminded patient on hydration. Will plan to assess balance next visit.  Patient will benefit from skilled PT services to improve her low back pain and functional strength  and decrease her risk of falling while improving her quality of life.   OBJECTIVE IMPAIRMENTS: Abnormal gait, decreased activity tolerance, decreased balance, decreased endurance, decreased mobility, difficulty walking, decreased strength, and pain.   ACTIVITY LIMITATIONS: carrying, lifting, bending, sitting, standing, squatting, sleeping, stairs, transfers, and bed mobility  PARTICIPATION LIMITATIONS: cleaning, laundry, shopping, community activity, and yard work  PERSONAL FACTORS: Age, Time since onset of injury/illness/exacerbation, and 3+ comorbidities: scoliosis, HTN, degenerative spine  are also affecting patient's functional outcome.   REHAB POTENTIAL: Good  CLINICAL DECISION MAKING: Stable/uncomplicated  EVALUATION COMPLEXITY: Moderate   GOALS: Goals reviewed with patient? Yes  SHORT TERM GOALS: Target date: 10/24/2022  Pt will decrease worst back pain as reported on NPRS by at least 2 points in order to demonstrate clinically significant reduction in back pain.  Baseline: Eval= 8/10 Goal status: INITIAL  LONG TERM GOALS: Target date: 12/04/2022  Pt will be independent with Final HEP in order to improve strength and decrease back pain in order to improve pain-free function at home and work.  Baseline: EVAL= Patient reports needs update and review of exercises to assist with her back pain Goal status: INITIAL  2.  Pt will decrease worst back pain  as reported on NPRS by at least 3 points in order to demonstrate clinically significant reduction in back pain.  Baseline: EVAL= 8/10 Goal status: INITIAL  3.  Pt will decrease mODI score by at least 13 points in order demonstrate clinically significant reduction in back pain/disability.   Baseline: EVAL=58% Goal status: INITIAL  4.  Pt will improve FOTO to target score of >55 to display perceived improvements in ability to complete ADL's.  Baseline: EVAL= 54 Goal status: INITIAL  5.  Patient (> 39 years old) will complete  five times sit to stand test in <15 seconds indicating an increased LE strength and improved balance.  Baseline: EVAL = 20.33 Goal status: INITIAL  6.  Pt will increase by at least 0.13 m/s in order to demonstrate clinically significant improvement in community ambulation.   Baseline: EVAL = 0.97 m/s Goal status: INITIAL 7.  Patient will report ability to complete > or equal to 60 min grocery store outing or similar community activiities to return to her previous level of function.  Baseline: EVAL: patient reports difficulty tolerating outings in community. Goal status: INITIAL PLAN:  PT FREQUENCY: 1-2x/week  PT DURATION: 12 weeks  PLANNED INTERVENTIONS: Therapeutic exercises, Therapeutic activity, Neuromuscular re-education, Balance training, Gait training, Patient/Family education, Self Care, Joint mobilization, Joint manipulation, Stair training, Vestibular training, Canalith repositioning, Orthotic/Fit training, DME instructions, Dry Needling, Electrical stimulation, Spinal manipulation, Spinal mobilization, Cryotherapy, Moist heat, scar mobilization, Taping, and Manual therapy.  PLAN FOR NEXT SESSION: Assess balance- BERG of FGA; Reivew HEP and add as appropriate.    Lenda Kelp, PT 09/16/2022, 8:58 AM

## 2022-09-23 ENCOUNTER — Ambulatory Visit: Payer: Medicare HMO

## 2022-09-23 DIAGNOSIS — R278 Other lack of coordination: Secondary | ICD-10-CM

## 2022-09-23 DIAGNOSIS — M6281 Muscle weakness (generalized): Secondary | ICD-10-CM

## 2022-09-23 DIAGNOSIS — R269 Unspecified abnormalities of gait and mobility: Secondary | ICD-10-CM | POA: Diagnosis not present

## 2022-09-23 DIAGNOSIS — R262 Difficulty in walking, not elsewhere classified: Secondary | ICD-10-CM

## 2022-09-23 DIAGNOSIS — M545 Low back pain, unspecified: Secondary | ICD-10-CM

## 2022-09-23 DIAGNOSIS — R2681 Unsteadiness on feet: Secondary | ICD-10-CM

## 2022-09-23 NOTE — Therapy (Signed)
OUTPATIENT PHYSICAL THERAPY TREATMENT   Patient Name: Kendra Benton MRN: [id number] DOB:07/17/1940, 82 y.o., female Today's Date: 09/23/2022  END OF SESSION:  PT End of Session - 09/23/22 1307     Visit Number 3    Number of Visits 24    Date for PT Re-Evaluation 12/04/22    PT Start Time 1300    PT Stop Time 1340    PT Time Calculation (min) 40 min    Equipment Utilized During Treatment Gait belt    Activity Tolerance Patient tolerated treatment well;Patient limited by pain    Behavior During Therapy WFL for tasks assessed/performed             Past Medical History:  Diagnosis Date   Allergy    Anxiety    GERD (gastroesophageal reflux disease)    Headache    History of kidney stones    Hypertension    Myocardial infarction    1997   Osteoporosis    Pneumonia    Past Surgical History:  Procedure Laterality Date   ABDOMINAL HYSTERECTOMY     patient has ovaries   APPENDECTOMY     ARTERY BIOPSY Right 12/30/2017   Procedure: BIOPSY TEMPORAL ARTERY;  Surgeon: Renford Dills, MD;  Location: [facility];  Service: Vascular;  Laterality: Right;   ARTERY BIOPSY Left 06/18/2018   Procedure: BIOPSY TEMPORAL ARTERY;  Surgeon: Renford Dills, MD;  Location: [facility];  Service: Vascular;  Laterality: Left;   BACK SURGERY     CARDIAC CATHETERIZATION     CATARACT EXTRACTION W/ INTRAOCULAR LENS  IMPLANT, BILATERAL Bilateral 2010   CHOLECYSTECTOMY N/A 02/28/2016   Procedure: LAPAROSCOPIC CHOLECYSTECTOMY WITH INTRAOPERATIVE CHOLANGIOGRAM;  Surgeon: Tiney Rouge III, MD;  Location: [facility];  Service: General;  Laterality: N/A;   LUMBAR FUSION  11/09/2016   L3-L5   SPINE SURGERY     Disc   TONSILLECTOMY     Patient Active Problem List   Diagnosis Date Noted   Fall (on) (from) other stairs and steps, initial encounter 08/06/2020   Lumbar facet joint syndrome 06/18/2020   Spondylosis without myelopathy or radiculopathy, lumbosacral region 06/18/2020   DDD (degenerative disc  disease), lumbosacral 06/18/2020   COVID 06/04/2020   Chronic pain syndrome 05/16/2020   Pharmacologic therapy 05/16/2020   Disorder of skeletal system 05/16/2020   Problems influencing health status 05/16/2020   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   Chronic hip pain (2ry area of Pain) (Bilateral) (R>L) 05/16/2020   Chronic lower extremity pain (Left) 05/16/2020   Numbness and tingling of both feet (intermittent/recurrent) 05/16/2020   Pain in rib (Bilateral) (R>L) (intermittent) 05/16/2020   Chronic knee pain (Left) 05/16/2020   Patellar pain (Left) (chronic, intermittent) 05/16/2020   Hard of hearing 05/16/2020   Carcinoma, lung, right 06/25/2019   S/P partial lobectomy of lung 02/25/2019   Mass of upper lobe of right lung 01/06/2019   Chronic nonintractable headache 06/14/2018   Head ache 12/03/2017   Sinus bradycardia 10/06/2017   Chronic low back pain (1ry area of Pain) (Bilateral) (L>R) w/o sciatica 08/17/2017   Panic attack as reaction to stress 08/17/2017   Anxiety 06/15/2017   Atypical chest pain 06/15/2017   Hyperlipidemia 06/15/2017   Cholecystitis with cholelithiasis 02/27/2016   Increased frequency of urination 01/27/2015   History of night sweats 05/01/2014   Dupuytren's contracture 07/18/2013   Inverted nipple 04/18/2013   Depression 04/23/2010   Fibromyalgia 04/23/2010   Hypercholesterolemia 04/23/2010   Essential hypertension 04/23/2010  Hypothyroidism 04/23/2010   Irritable colon 04/23/2010   Osteoarthritis 04/23/2010    PCP: Kendra Sieve, MD  REFERRING PROVIDER: Cain Sieve, MD  REFERRING DIAG: Chronic low back pain; At risk for falls  Rationale for Evaluation and Treatment: Rehabilitation  THERAPY DIAG:  Abnormality of gait and mobility  Difficulty in walking, not elsewhere classified  Muscle weakness (generalized)  Other lack of coordination  Unsteadiness on feet  Chronic bilateral low back pain without  sciatica  ONSET DATE: chronic but worse since Jan 2024  SUBJECTIVE:                                                                                                                                                                                           SUBJECTIVE STATEMENT: Patient reports doing fine today, no significant updates since prior session. She tried her first yoga session and modified as needed, was exhausted thereafter. Pain remains largely unchanged.   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 She has received PT in the past for condition with positive results and currently using pain management strategies including pain meds, Heat, Exercises, Yoga, lidocain patch, diclofenac topical gel, and biofreeze. She was HTN controlled with medication.  She reports still lives alone but having trouble with community and social outings and having to modify how she performs her ADLs and housework due to her chronic pain. PMH- Spondylosis, Scoliosis, HTN, Lung CA.  PAIN:  Are you having pain? Yes 4/10 across low back, Right flank  PRECAUTIONS: Fall  WEIGHT BEARING RESTRICTIONS: No  FALLS:  Has patient fallen in last 6 months? No- However patient reports some balance issues and near falls- using cane with community outings  PLOF: Independent  PATIENT GOALS: To decrease my pain  NEXT MD VISIT: 10/06/2022- Dr. Waylan Boga at [facility]- for right hand  OBJECTIVE:   Orthostatic Vital signs 09/23/22            Supine   Sitting- 0 min  Sitting- 1 min Standing- 0 min Standing- 1 min Standing- s/p AMB  BP (mmHg)  HR (BPM)  BP (mmHg)  HR (BPM)  BP (mmHg)  HR (BPM)  BP (mmHg)  HR (BPM)  BP (mmHg)  HR (BPM)  BP (mmHg)  HR (BPM)   157/51 63 135/55 64 153/55 62 135/48 62 136/53 62 128/52 62  -testing without symptoms, however pt exhibits activity limiting symptoms during AMB testing later in session: SOB,  weakness in legs, and post exercise ataxia, all empirical signs of orthostatic hypotension. Warrants assessment of vitals during and post  exercise in future.   FUNCTIONAL TESTS:  -BBT: 09/23/22 started, will need to complete next session - : 09/23/22: Pt unable to tolerate sustained AMB of 4 minutes due to dyspnea on exertion and progression in back pain from 4/10 to 6/10 (2 attempts c SPC and YRW)    TODAY'S TREATMENT:                                                                                                                              DATE: 09/23/22  - attempt #1: use of SPC RUE (pt preference): starting pain 4/10, then 5/10 at 346ft, and 6/10 at 57ft: audible RR  dyspnea. Test terminated after 670ft, 3 minutes 15 seconds *seated recovery, HR ~75bpm, SPC adjusts up 1 pinhole to decrease lateral trunk flexion in gait  - attempt #2: repeat of 649ft, author asks pt to use YRW (4UJ unavailable); similar increase in pain as above. Time requires 3:46 as pt tries to pace self to stave off dyspnea. *pt is not a fan of a larger device compared to [facility], but use of 4WW may have utility in future aplication of exercise-oriented walking  *ataxia AMB back from main hallway to treatment area much worse, pt could have taken a longer recovery interval. Suspect orthostasis if not sustained bradycardia alone playing a role here.   BBT trial- limited due to time. Pt has increase in back pain with select criteria tested.  Will finish next session.    [facility] PT Assessment - 09/23/22 0001       Standardized Balance Assessment   Standardized Balance Assessment Berg Balance Test      Berg Balance Test   Sit to Stand Able to stand without using hands and stabilize independently    Standing Unsupported Able to stand safely 2 minutes    Sitting with Back Unsupported but Feet Supported on Floor or Stool Able to sit safely and securely 2 minutes    Stand to Sit Sits safely with minimal use of hands     Transfers Able to transfer safely, minor use of hands    Standing Unsupported, Alternately Place Feet on Step/Stool Able to stand independently and safely and complete 8 steps in 20 seconds    Standing Unsupported, One Foot in Front Able to take small step independently and hold 30 seconds    Standing on One Leg Tries to lift leg/unable to hold 3 seconds but remains standing independently              PATIENT EDUCATION:  Education details: PT plan of care; discussion of anatomy and how scoliosis affects the low back musculature.  Person educated: Patient Education method: Explanation, Demonstration, Tactile cues, and Verbal cues Education comprehension: verbalized understanding, returned demonstration, verbal cues required, and tactile cues required  HOME EXERCISE PROGRAM: No updates today.  Previously resumed HEP from prior episode.   ASSESSMENT:  CLINICAL IMPRESSION: Pt in good spirits despite sustained back pain symptoms, however her  tolerance to sustained gait and balance testing are both very limited due to progression in back pain which improves after return to sitting. Pt denies any dizziness this date, but does endorse recurrent phenomenon at baseline and reports orthostatics hypotension to be a known issue. She is not symptomatic today which may speak to the chronicity of the phenomenon per the clinical expertise of author- the HR values in testing also met the criteria empirically for dysautonomia, although pharmacokinetics or polypharmacy would need to be ruled out by PCP. Educated on pros/cons of SPC v walker use, however pt does not have a desire to use a walker at this time. Will plan to complete balance assessment next visit.     OBJECTIVE IMPAIRMENTS: Abnormal gait, decreased activity tolerance, decreased balance, decreased endurance, decreased mobility, difficulty walking, decreased strength, and pain.   ACTIVITY LIMITATIONS: carrying, lifting, bending, sitting,  standing, squatting, sleeping, stairs, transfers, and bed mobility  PARTICIPATION LIMITATIONS: cleaning, laundry, shopping, community activity, and yard work  PERSONAL FACTORS: Age, Time since onset of injury/illness/exacerbation, and 3+ comorbidities: scoliosis, HTN, degenerative spine  are also affecting patient's functional outcome.   REHAB POTENTIAL: Good  CLINICAL DECISION MAKING: Stable/uncomplicated  EVALUATION COMPLEXITY: Moderate   GOALS: Goals reviewed with patient? Yes  SHORT TERM GOALS: Target date: 10/24/2022  Pt will decrease worst back pain as reported on NPRS by at least 2 points in order to demonstrate clinically significant reduction in back pain.  Baseline: Eval= 8/10 Goal status: INITIAL  LONG TERM GOALS: Target date: 12/04/2022  Pt will be independent with Final HEP in order to improve strength and decrease back pain in order to improve pain-free function at home and work.  Baseline: EVAL= Patient reports needs update and review of exercises to assist with her back pain Goal status: INITIAL  2.  Pt will decrease worst back pain as reported on NPRS by at least 3 points in order to demonstrate clinically significant reduction in back pain.  Baseline: EVAL= 8/10 Goal status: INITIAL  3.  Pt will decrease mODI score by at least 13 points in order demonstrate clinically significant reduction in back pain/disability.   Baseline: EVAL=58% Goal status: INITIAL  4.  Pt will improve FOTO to target score of >55 to display perceived improvements in ability to complete ADL's.  Baseline: EVAL= 54 Goal status: INITIAL  5.  Patient (> 79 years old) will complete five times sit to stand test in <15 seconds indicating an increased LE strength and improved balance.  Baseline: EVAL = 20.33 Goal status: INITIAL  6.  Pt will increase by at least 0.13 m/s in order to demonstrate clinically significant improvement in community ambulation.   Baseline: EVAL = 0.97  m/s Goal status: INITIAL 7.  Patient will report ability to complete > or equal to 60 min grocery store outing or similar community activiities to return to her previous level of function.  Baseline: EVAL: patient reports difficulty tolerating outings in community. Goal status: INITIAL PLAN:  PT FREQUENCY: 1-2x/week  PT DURATION: 12 weeks  PLANNED INTERVENTIONS: Therapeutic exercises, Therapeutic activity, Neuromuscular re-education, Balance training, Gait training, Patient/Family education, Self Care, Joint mobilization, Joint manipulation, Stair training, Vestibular training, Canalith repositioning, Orthotic/Fit training, DME instructions, Dry Needling, Electrical stimulation, Spinal manipulation, Spinal mobilization, Cryotherapy, Moist heat, scar mobilization, Taping, and Manual therapy.  PLAN FOR NEXT SESSION: Assess balance- BERG of FGA; Reivew HEP and add as appropriate.       Shacoya Burkhammer C, PT 09/23/2022, 1:10 PM   1:10  PM, 09/23/22 Rosamaria Lints, PT, DPT Physical Therapist - [facility]  Outpatient Physical Therapy- Main Campus [phone]

## 2022-09-29 NOTE — Therapy (Signed)
OUTPATIENT PHYSICAL THERAPY TREATMENT   Patient Name: Kendra Benton MRN: [id number] DOB:10/20/1940, 82 y.o., female Today's Date: 09/30/2022  END OF SESSION:  PT End of Session - 09/30/22 1021     Visit Number 4    Number of Visits 24    Date for PT Re-Evaluation 12/04/22    Progress Note Due on Visit 10    PT Start Time 1019    PT Stop Time 1059    PT Time Calculation (min) 40 min    Equipment Utilized During Treatment Gait belt    Activity Tolerance Patient tolerated treatment well;Patient limited by pain    Behavior During Therapy WFL for tasks assessed/performed             Past Medical History:  Diagnosis Date   Allergy    Anxiety    GERD (gastroesophageal reflux disease)    Headache    History of kidney stones    Hypertension    Myocardial infarction    1997   Osteoporosis    Pneumonia    Past Surgical History:  Procedure Laterality Date   ABDOMINAL HYSTERECTOMY     patient has ovaries   APPENDECTOMY     ARTERY BIOPSY Right 12/30/2017   Procedure: BIOPSY TEMPORAL ARTERY;  Surgeon: Renford Dills, MD;  Location: [facility];  Service: Vascular;  Laterality: Right;   ARTERY BIOPSY Left 06/18/2018   Procedure: BIOPSY TEMPORAL ARTERY;  Surgeon: Renford Dills, MD;  Location: [facility];  Service: Vascular;  Laterality: Left;   BACK SURGERY     CARDIAC CATHETERIZATION     CATARACT EXTRACTION W/ INTRAOCULAR LENS  IMPLANT, BILATERAL Bilateral 2010   CHOLECYSTECTOMY N/A 02/28/2016   Procedure: LAPAROSCOPIC CHOLECYSTECTOMY WITH INTRAOPERATIVE CHOLANGIOGRAM;  Surgeon: Tiney Rouge III, MD;  Location: [facility];  Service: General;  Laterality: N/A;   LUMBAR FUSION  11/09/2016   L3-L5   SPINE SURGERY     Disc   TONSILLECTOMY     Patient Active Problem List   Diagnosis Date Noted   Fall (on) (from) other stairs and steps, initial encounter 08/06/2020   Lumbar facet joint syndrome 06/18/2020   Spondylosis without myelopathy or radiculopathy, lumbosacral region  06/18/2020   DDD (degenerative disc disease), lumbosacral 06/18/2020   COVID 06/04/2020   Chronic pain syndrome 05/16/2020   Pharmacologic therapy 05/16/2020   Disorder of skeletal system 05/16/2020   Problems influencing health status 05/16/2020   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   Chronic hip pain (2ry area of Pain) (Bilateral) (R>L) 05/16/2020   Chronic lower extremity pain (Left) 05/16/2020   Numbness and tingling of both feet (intermittent/recurrent) 05/16/2020   Pain in rib (Bilateral) (R>L) (intermittent) 05/16/2020   Chronic knee pain (Left) 05/16/2020   Patellar pain (Left) (chronic, intermittent) 05/16/2020   Hard of hearing 05/16/2020   Carcinoma, lung, right 06/25/2019   S/P partial lobectomy of lung 02/25/2019   Mass of upper lobe of right lung 01/06/2019   Chronic nonintractable headache 06/14/2018   Head ache 12/03/2017   Sinus bradycardia 10/06/2017   Chronic low back pain (1ry area of Pain) (Bilateral) (L>R) w/o sciatica 08/17/2017   Panic attack as reaction to stress 08/17/2017   Anxiety 06/15/2017   Atypical chest pain 06/15/2017   Hyperlipidemia 06/15/2017   Cholecystitis with cholelithiasis 02/27/2016   Increased frequency of urination 01/27/2015   History of night sweats 05/01/2014   Dupuytren's contracture 07/18/2013   Inverted nipple 04/18/2013   Depression 04/23/2010   Fibromyalgia 04/23/2010  Hypercholesterolemia 04/23/2010   Essential hypertension 04/23/2010   Hypothyroidism 04/23/2010   Irritable colon 04/23/2010   Osteoarthritis 04/23/2010    PCP: Cain Sieve, MD  REFERRING PROVIDER: Cain Sieve, MD  REFERRING DIAG: Chronic low back pain; At risk for falls  Rationale for Evaluation and Treatment: Rehabilitation  THERAPY DIAG:  Abnormality of gait and mobility  Difficulty in walking, not elsewhere classified  Muscle weakness (generalized)  Other lack of coordination  Unsteadiness on feet  Chronic  bilateral low back pain without sciatica  ONSET DATE: chronic but worse since Jan 2024  SUBJECTIVE:                                                                                                                                                                                           SUBJECTIVE STATEMENT: Patient reports doing about the same- did try yoga again and states the last class was more challenging than the first. Rates pain at a 4/10 today.    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 She has received PT in the past for condition with positive results and currently using pain management strategies including pain meds, Heat, Exercises, Yoga, lidocain patch, diclofenac topical gel, and biofreeze. She was HTN controlled with medication.  She reports still lives alone but having trouble with community and social outings and having to modify how she performs her ADLs and housework due to her chronic pain. PMH- Spondylosis, Scoliosis, HTN, Lung CA.  PAIN:  Are you having pain? Yes 4/10 across low back, Right flank  PRECAUTIONS: Fall  WEIGHT BEARING RESTRICTIONS: No  FALLS:  Has patient fallen in last 6 months? No- However patient reports some balance issues and near falls- using cane with community outings  PLOF: Independent  PATIENT GOALS: To decrease my pain  NEXT MD VISIT: 10/06/2022- Dr. Waylan Boga at [facility]- for right hand  OBJECTIVE:    TODAY'S TREATMENT:  DATE: 09/30/22   Orthostatic Vital signs 09/30/22            Supine   Sitting- 0 min  Sitting- 3 min Standing- 0 min Standing- 3 min Standing- s/p BERG  BP (mmHg)  HR (BPM)  BP (mmHg)  HR (BPM)  BP (mmHg)  HR (BPM)  BP (mmHg)  HR (BPM)  BP (mmHg)  HR (BPM)  BP (mmHg)  HR (BPM)   146/57 66 118/52 65 140/48 64  119/43 64 121/52 66 128/52 62  -asymptomatic except report of some weakness with prolonged standing, however pt exhibits activity limiting symptoms during AMB testing later in session: SOB, weakness in legs, and post exercise ataxia, all empirical signs of orthostatic hypotension. Warrants assessment of vitals during and post exercise in future.   FUNCTIONAL TESTS:  -BBT: 47/56   Manual stretching: Moist heat applied during all stretching  Long axis distraction- hold 30 sec on left - x 4  LTR - hold 30 sec x4 Piriformis- hold 30 sec x 3 Hip ER/IR - Hold 30 sec x 3  Therex:   Supine- hip add with ball squeeze x 5 sec hold x 10  Sidelye hip clamshell x 10 reps on left         PATIENT EDUCATION:  Education details: PT plan of care; discussion of anatomy and how scoliosis affects the low back musculature.  Person educated: Patient Education method: Explanation, Demonstration, Tactile cues, and Verbal cues Education comprehension: verbalized understanding, returned demonstration, verbal cues required, and tactile cues required  HOME EXERCISE PROGRAM: No updates today.  Previously resumed HEP from prior episode.   ASSESSMENT:  CLINICAL IMPRESSION: Patient continues to endorse chronic low back pain- States feeling mostly weak with standing and balance testing. She did respond well to moist heat and stretching today with some improved back pain- 3/10. Added 6 min walk test and BERG balance test goal to reflect more impairment identified including decreased endurance and impaired balance. She was able to perform some pelvic strengthening without significant difficulty.  Patient will benefit from skilled PT services to improve her low back pain and functional strength and decrease her risk of falling while improving her quality of life.     OBJECTIVE IMPAIRMENTS: Abnormal gait, decreased activity tolerance, decreased balance, decreased endurance, decreased mobility, difficulty walking,  decreased strength, and pain.   ACTIVITY LIMITATIONS: carrying, lifting, bending, sitting, standing, squatting, sleeping, stairs, transfers, and bed mobility  PARTICIPATION LIMITATIONS: cleaning, laundry, shopping, community activity, and yard work  PERSONAL FACTORS: Age, Time since onset of injury/illness/exacerbation, and 3+ comorbidities: scoliosis, HTN, degenerative spine  are also affecting patient's functional outcome.   REHAB POTENTIAL: Good  CLINICAL DECISION MAKING: Stable/uncomplicated  EVALUATION COMPLEXITY: Moderate   GOALS: Goals reviewed with patient? Yes  SHORT TERM GOALS: Target date: 10/24/2022  Pt will decrease worst back pain as reported on NPRS by at least 2 points in order to demonstrate clinically significant reduction in back pain.  Baseline: Eval= 8/10 Goal status: INITIAL  LONG TERM GOALS: Target date: 12/04/2022  Pt will be independent with Final HEP in order to improve strength and decrease back pain in order to improve pain-free function at home and work.  Baseline: EVAL= Patient reports needs update and review of exercises to assist with her back pain Goal status: INITIAL  2.  Pt will decrease worst back pain as reported on NPRS by at least 3 points in order to demonstrate clinically significant reduction in back pain.  Baseline: EVAL= 8/10  Goal status: INITIAL  3.  Pt will decrease mODI score by at least 13 points in order demonstrate clinically significant reduction in back pain/disability.   Baseline: EVAL=58% Goal status: INITIAL  4.  Pt will improve FOTO to target score of >55 to display perceived improvements in ability to complete ADL's.  Baseline: EVAL= 54 Goal status: INITIAL  5.  Patient (> 42 years old) will complete five times sit to stand test in <15 seconds indicating an increased LE strength and improved balance.  Baseline: EVAL = 20.33 Goal status: INITIAL  6.  Pt will increase by at least 0.13 m/s in order to demonstrate  clinically significant improvement in community ambulation.   Baseline: EVAL = 0.97 m/s Goal status: INITIAL 7.  Patient will report ability to complete > or equal to 60 min grocery store outing or similar community activiities to return to her previous level of function.  Baseline: EVAL: patient reports difficulty tolerating outings in community. Goal status: INITIAL  8. Pt will improve BERG by at least 3 points in order to demonstrate clinically significant improvement in balance.    Baseline: 09/30/2022= 47/56  Goal status: INITIAL  9. Pt will increase by at least 45m (131ft) in order to demonstrate clinically significant improvement in cardiopulmonary endurance and community ambulation   Baseline: 09/23/2022= use of SPC RUE (pt preference): starting pain 4/10, then 5/10 at 373ft, and 6/10 at 574ft: audible RR dyspnea. Test terminated after 616ft, 3 minutes 15 seconds   Goal status: INITIAL PLAN:  PT FREQUENCY: 1-2x/week  PT DURATION: 12 weeks  PLANNED INTERVENTIONS: Therapeutic exercises, Therapeutic activity, Neuromuscular re-education, Balance training, Gait training, Patient/Family education, Self Care, Joint mobilization, Joint manipulation, Stair training, Vestibular training, Canalith repositioning, Orthotic/Fit training, DME instructions, Dry Needling, Electrical stimulation, Spinal manipulation, Spinal mobilization, Cryotherapy, Moist heat, scar mobilization, Taping, and Manual therapy.  PLAN FOR NEXT SESSION: Continue to monitor vitals to add data for BP values. Scoliosis biased exercises; dynamic balance training.        Lenda Kelp, PT 09/30/2022, 4:26 PM   4:26 PM, 09/30/22 Physical Therapist - [facility]  Outpatient Physical Therapy- Main Campus [phone]

## 2022-09-30 ENCOUNTER — Ambulatory Visit: Payer: Medicare HMO

## 2022-09-30 DIAGNOSIS — R2681 Unsteadiness on feet: Secondary | ICD-10-CM

## 2022-09-30 DIAGNOSIS — M6281 Muscle weakness (generalized): Secondary | ICD-10-CM

## 2022-09-30 DIAGNOSIS — R262 Difficulty in walking, not elsewhere classified: Secondary | ICD-10-CM

## 2022-09-30 DIAGNOSIS — R269 Unspecified abnormalities of gait and mobility: Secondary | ICD-10-CM | POA: Diagnosis not present

## 2022-09-30 DIAGNOSIS — R278 Other lack of coordination: Secondary | ICD-10-CM

## 2022-09-30 DIAGNOSIS — G8929 Other chronic pain: Secondary | ICD-10-CM

## 2022-10-03 ENCOUNTER — Ambulatory Visit: Payer: Medicare HMO

## 2022-10-07 ENCOUNTER — Ambulatory Visit: Payer: Medicare HMO

## 2022-10-07 DIAGNOSIS — R278 Other lack of coordination: Secondary | ICD-10-CM

## 2022-10-07 DIAGNOSIS — R269 Unspecified abnormalities of gait and mobility: Secondary | ICD-10-CM

## 2022-10-07 DIAGNOSIS — R262 Difficulty in walking, not elsewhere classified: Secondary | ICD-10-CM

## 2022-10-07 DIAGNOSIS — M6281 Muscle weakness (generalized): Secondary | ICD-10-CM

## 2022-10-07 NOTE — Therapy (Signed)
OUTPATIENT PHYSICAL THERAPY TREATMENT   Patient Name: Kendra Benton MRN: [id number] DOB:17-Jun-1940, 82 y.o., female Today's Date: 10/07/2022  END OF SESSION:  PT End of Session - 10/07/22 1627     Visit Number 5    Number of Visits 24    Date for PT Re-Evaluation 12/04/22    Authorization Type [employer] Medicare primary; [place] medicaid secondary    Authorization Time Period 09/11/22-12/04/22    Progress Note Due on Visit 10    PT Start Time 1600    PT Stop Time 1640    PT Time Calculation (min) 40 min    Equipment Utilized During Treatment Gait belt    Activity Tolerance Patient tolerated treatment well;Patient limited by pain    Behavior During Therapy WFL for tasks assessed/performed             Past Medical History:  Diagnosis Date   Allergy    Anxiety    GERD (gastroesophageal reflux disease)    Headache    History of kidney stones    Hypertension    Myocardial infarction (HCC)    1997   Osteoporosis    Pneumonia    Past Surgical History:  Procedure Laterality Date   ABDOMINAL HYSTERECTOMY     patient has ovaries   APPENDECTOMY     ARTERY BIOPSY Right 12/30/2017   Procedure: BIOPSY TEMPORAL ARTERY;  Surgeon: Renford Dills, MD;  Location: [facility];  Service: Vascular;  Laterality: Right;   ARTERY BIOPSY Left 06/18/2018   Procedure: BIOPSY TEMPORAL ARTERY;  Surgeon: Renford Dills, MD;  Location: [facility];  Service: Vascular;  Laterality: Left;   BACK SURGERY     CARDIAC CATHETERIZATION     CATARACT EXTRACTION W/ INTRAOCULAR LENS  IMPLANT, BILATERAL Bilateral 2010   CHOLECYSTECTOMY N/A 02/28/2016   Procedure: LAPAROSCOPIC CHOLECYSTECTOMY WITH INTRAOPERATIVE CHOLANGIOGRAM;  Surgeon: Tiney Rouge III, MD;  Location: [facility];  Service: General;  Laterality: N/A;   LUMBAR FUSION  11/09/2016   L3-L5   SPINE SURGERY     Disc   TONSILLECTOMY     Patient Active Problem List   Diagnosis Date Noted   Fall (on) (from) other stairs and steps, initial encounter  08/06/2020   Lumbar facet joint syndrome 06/18/2020   Spondylosis without myelopathy or radiculopathy, lumbosacral region 06/18/2020   DDD (degenerative disc disease), lumbosacral 06/18/2020   COVID 06/04/2020   Chronic pain syndrome 05/16/2020   Pharmacologic therapy 05/16/2020   Disorder of skeletal system 05/16/2020   Problems influencing health status 05/16/2020   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   Chronic hip pain (2ry area of Pain) (Bilateral) (R>L) 05/16/2020   Chronic lower extremity pain (Left) 05/16/2020   Numbness and tingling of both feet (intermittent/recurrent) 05/16/2020   Pain in rib (Bilateral) (R>L) (intermittent) 05/16/2020   Chronic knee pain (Left) 05/16/2020   Patellar pain (Left) (chronic, intermittent) 05/16/2020   Hard of hearing 05/16/2020   Carcinoma, lung, 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/25/2019   S/P partial lobectomy of lung 02/25/2019   Mass of upper lobe of right lung 01/06/2019   Chronic nonintractable headache 06/14/2018   Head ache 12/03/2017   Sinus bradycardia 10/06/2017   Chronic low back pain (1ry area of Pain) (Bilateral) (L>R) w/o sciatica 08/17/2017   Panic attack as reaction to stress 08/17/2017   Anxiety 06/15/2017   Atypical chest pain 06/15/2017   Hyperlipidemia 06/15/2017   Cholecystitis with cholelithiasis 02/27/2016   Increased frequency of urination 01/27/2015   History of night sweats  05/01/2014   Dupuytren's contracture 07/18/2013   Inverted nipple 04/18/2013   Depression 04/23/2010   Fibromyalgia 04/23/2010   Hypercholesterolemia 04/23/2010   Essential hypertension 04/23/2010   Hypothyroidism 04/23/2010   Irritable colon 04/23/2010   Osteoarthritis 04/23/2010    PCP: Cain Sieve, MD  REFERRING PROVIDER: Cain Sieve, MD  REFERRING DIAG: Chronic low back pain; At risk for falls  Rationale for Evaluation and Treatment: Rehabilitation  THERAPY DIAG:  Abnormality of gait and mobility  Difficulty in  walking, not elsewhere classified  Muscle weakness (generalized)  Other lack of coordination  ONSET DATE: chronic but worse since Jan 2024  SUBJECTIVE:                                                                                                                                                                                           SUBJECTIVE STATEMENT: Doing well today. Missed yoga this week which she says makes a big difference as she does not feel as good pain wise. Pt reports fair compliance with HEP, but denies any frank adverse affects from HEP.   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 She has received PT in the past for condition with positive results and currently using pain management strategies including pain meds, Heat, Exercises, Yoga, lidocain patch, diclofenac topical gel, and biofreeze. She was HTN controlled with medication.  She reports still lives alone but having trouble with community and social outings and having to modify how she performs her ADLs and housework due to her chronic pain. PMH- Spondylosis, Scoliosis, HTN, Lung CA.  PAIN:  Are you having pain? Yes 4/10 across low back, Right flank  PRECAUTIONS: Fall  WEIGHT BEARING RESTRICTIONS: No  FALLS:  Has patient fallen in last 6 months? No- However patient reports some balance issues and near falls- using cane with community outings  PLOF: Independent  PATIENT GOALS: To decrease my pain  NEXT MD VISIT: 10/06/2022- Dr. Waylan Boga at [facility]- for right hand  OBJECTIVE:   Orthostatic Vital signs 09/30/22            Supine   Sitting- 0 min  Sitting- 3 min Standing- 0 min Standing- 3 min Standing- s/p BERG  BP (mmHg)  HR (BPM)  BP (mmHg)  HR (BPM)  BP (mmHg)  HR (BPM)  BP (mmHg)  HR (BPM)  BP (mmHg)  HR (BPM)  BP (mmHg)  HR (BPM)   146/57 66 118/52 65 140/48 64 119/43 64 121/52 66 128/52 62  -asymptomatic except  report of some weakness with prolonged standing,  however pt exhibits activity limiting symptoms during AMB testing later in session: SOB, weakness in legs, and post exercise ataxia, all empirical signs of orthostatic hypotension. Warrants assessment of vitals during and post exercise in future.   TODAY'S TREATMENT:                                                                                                                              DATE: 09/30/22  -Manual release to Left gluteus medius, left paraspinals, Left QL (don't feel any issues at QL)  -hooklying bridge 1x10 -hooklying reverse curl up (double leg lift) 1x15  -hooklying bridge 1x10 -hooklying reverse curlup (double leg lift) 1x15   -STS x12, hands free -seated cable trunk rotation and cross arm reach 1x12 bilat c 2.5lb  (cable at hole 7)  -STS x12, hands free -seated cable trunk rotation and cross arm reach 1x12 bilat c 2.5lb  (cable at hole 7)    PATIENT EDUCATION:  Education details: PT plan of care; discussion of anatomy and how scoliosis affects the low back musculature.  Person educated: Patient Education method: Explanation, Demonstration, Tactile cues, and Verbal cues Education comprehension: verbalized understanding, returned demonstration, verbal cues required, and tactile cues required  HOME EXERCISE PROGRAM: No updates today.  Previously resumed HEP from prior episode.   ASSESSMENT:  CLINICAL IMPRESSION: Manual release to address tenderness and tightness in low back and gluteus medius. Then mild to moderate loading of gross spine motor group and core. Good tolerance overall, no increased pain. Pt puts forth incredible effort, strong motivation to arrest the progression of spine curvature changes.  Exercises modified to avoid aggravation of new shoulder pain. Patient will benefit from skilled PT services to improve her low back pain and functional strength and decrease her risk of falling while improving her  quality of life.    OBJECTIVE IMPAIRMENTS: Abnormal gait, decreased activity tolerance, decreased balance, decreased endurance, decreased mobility, difficulty walking, decreased strength, and pain.   ACTIVITY LIMITATIONS: carrying, lifting, bending, sitting, standing, squatting, sleeping, stairs, transfers, and bed mobility  PARTICIPATION LIMITATIONS: cleaning, laundry, shopping, community activity, and yard work  PERSONAL FACTORS: Age, Time since onset of injury/illness/exacerbation, and 3+ comorbidities: scoliosis, HTN, degenerative spine  are also affecting patient's functional outcome.   REHAB POTENTIAL: Good  CLINICAL DECISION MAKING: Stable/uncomplicated  EVALUATION COMPLEXITY: Moderate   GOALS: Goals reviewed with patient? Yes  SHORT TERM GOALS: Target date: 10/24/2022  Pt will decrease worst back pain as reported on NPRS by at least 2 points in order to demonstrate clinically significant reduction in back pain.  Baseline: Eval= 8/10 Goal status: INITIAL  LONG TERM GOALS: Target date: 12/04/2022  Pt will be independent with Final HEP in order to improve strength and decrease back pain in order to improve pain-free function at home and work.  Baseline: EVAL= Patient reports needs update and review of exercises to assist with her back pain Goal status: INITIAL  2.  Pt will decrease  worst back pain as reported on NPRS by at least 3 points in order to demonstrate clinically significant reduction in back pain.  Baseline: EVAL= 8/10 Goal status: INITIAL  3.  Pt will decrease mODI score by at least 13 points in order demonstrate clinically significant reduction in back pain/disability.   Baseline: EVAL=58% Goal status: INITIAL  4.  Pt will improve FOTO to target score of >55 to display perceived improvements in ability to complete ADL's.  Baseline: EVAL= 54 Goal status: INITIAL  5.  Patient (> 71 years old) will complete five times sit to stand test in <15 seconds  indicating an increased LE strength and improved balance.  Baseline: EVAL = 20.33 Goal status: INITIAL  6.  Pt will increase by at least 0.13 m/s in order to demonstrate clinically significant improvement in community ambulation.   Baseline: EVAL = 0.97 m/s Goal status: INITIAL 7.  Patient will report ability to complete > or equal to 60 min grocery store outing or similar community activiities to return to her previous level of function.  Baseline: EVAL: patient reports difficulty tolerating outings in community. Goal status: INITIAL  8. Pt will improve BERG by at least 3 points in order to demonstrate clinically significant improvement in balance.    Baseline: 09/30/2022= 47/56  Goal status: INITIAL  9. Pt will increase by at least 13m (131ft) in order to demonstrate clinically significant improvement in cardiopulmonary endurance and community ambulation   Baseline: 09/23/2022= use of SPC RUE (pt preference): starting pain 4/10, then 5/10 at 382ft, and 6/10 at 565ft: audible RR dyspnea. Test terminated after 65ft, 3 minutes 15 seconds   Goal status: INITIAL PLAN:  PT FREQUENCY: 1-2x/week  PT DURATION: 12 weeks  PLANNED INTERVENTIONS: Therapeutic exercises, Therapeutic activity, Neuromuscular re-education, Balance training, Gait training, Patient/Family education, Self Care, Joint mobilization, Joint manipulation, Stair training, Vestibular training, Canalith repositioning, Orthotic/Fit training, DME instructions, Dry Needling, Electrical stimulation, Spinal manipulation, Spinal mobilization, Cryotherapy, Moist heat, scar mobilization, Taping, and Manual therapy.  PLAN FOR NEXT SESSION: Continue to monitor vitals to add data for BP values. Scoliosis biased exercises and core strength, dynamic balance training.        Gabriele Loveland C, PT 10/07/2022, 4:38 PM   4:38 PM, 10/07/22 Physical Therapist - [facility]  Outpatient Physical  Therapy- Main Campus [phone]    4:38 PM, 10/07/22 Rosamaria Lints, PT, DPT Physical Therapist - [facility]  Outpatient Physical Therapy- Main Campus [phone]

## 2022-10-09 ENCOUNTER — Ambulatory Visit: Payer: Medicare HMO | Attending: Internal Medicine

## 2022-10-09 DIAGNOSIS — G8929 Other chronic pain: Secondary | ICD-10-CM | POA: Diagnosis present

## 2022-10-09 DIAGNOSIS — R2689 Other abnormalities of gait and mobility: Secondary | ICD-10-CM | POA: Insufficient documentation

## 2022-10-09 DIAGNOSIS — R262 Difficulty in walking, not elsewhere classified: Secondary | ICD-10-CM

## 2022-10-09 DIAGNOSIS — R278 Other lack of coordination: Secondary | ICD-10-CM

## 2022-10-09 DIAGNOSIS — R2681 Unsteadiness on feet: Secondary | ICD-10-CM | POA: Diagnosis present

## 2022-10-09 DIAGNOSIS — M545 Low back pain, unspecified: Secondary | ICD-10-CM | POA: Insufficient documentation

## 2022-10-09 DIAGNOSIS — M6281 Muscle weakness (generalized): Secondary | ICD-10-CM | POA: Diagnosis present

## 2022-10-09 DIAGNOSIS — R269 Unspecified abnormalities of gait and mobility: Secondary | ICD-10-CM

## 2022-10-09 NOTE — Therapy (Signed)
OUTPATIENT PHYSICAL THERAPY TREATMENT   Patient Name: Kendra Benton MRN: [id number] DOB:1941/03/10, 82 y.o., female Today's Date: 10/09/2022  END OF SESSION:  PT End of Session - 10/09/22 0859     Visit Number 6    Number of Visits 24    Date for PT Re-Evaluation 12/04/22    Authorization Type [employer] Medicare primary;  medicaid secondary    Authorization Time Period 09/11/22-12/04/22    Progress Note Due on Visit 10    PT Start Time 0854    PT Stop Time 0927    PT Time Calculation (min) 33 min    Equipment Utilized During Treatment Gait belt    Activity Tolerance Patient tolerated treatment well;Patient limited by pain    Behavior During Therapy WFL for tasks assessed/performed              Past Medical History:  Diagnosis Date   Allergy    Anxiety    GERD (gastroesophageal reflux disease)    Headache    History of kidney stones    Hypertension    Myocardial infarction (HCC)    1997   Osteoporosis    Pneumonia    Past Surgical History:  Procedure Laterality Date   ABDOMINAL HYSTERECTOMY     patient has ovaries   APPENDECTOMY     ARTERY BIOPSY Right 12/30/2017   Procedure: BIOPSY TEMPORAL ARTERY;  Surgeon: Renford Dills, MD;  Location: [facility];  Service: Vascular;  Laterality: Right;   ARTERY BIOPSY Left 06/18/2018   Procedure: BIOPSY TEMPORAL ARTERY;  Surgeon: Renford Dills, MD;  Location: [facility];  Service: Vascular;  Laterality: Left;   BACK SURGERY     CARDIAC CATHETERIZATION     CATARACT EXTRACTION W/ INTRAOCULAR LENS  IMPLANT, BILATERAL Bilateral 2010   CHOLECYSTECTOMY N/A 02/28/2016   Procedure: LAPAROSCOPIC CHOLECYSTECTOMY WITH INTRAOPERATIVE CHOLANGIOGRAM;  Surgeon: Tiney Rouge III, MD;  Location: [facility];  Service: General;  Laterality: N/A;   LUMBAR FUSION  11/09/2016   L3-L5   SPINE SURGERY     Disc   TONSILLECTOMY     Patient Active Problem List   Diagnosis Date Noted   Fall (on) (from) other stairs and steps, initial encounter  08/06/2020   Lumbar facet joint syndrome 06/18/2020   Spondylosis without myelopathy or radiculopathy, lumbosacral region 06/18/2020   DDD (degenerative disc disease), lumbosacral 06/18/2020   COVID 06/04/2020   Chronic pain syndrome 05/16/2020   Pharmacologic therapy 05/16/2020   Disorder of skeletal system 05/16/2020   Problems influencing health status 05/16/2020   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   Chronic hip pain (2ry area of Pain) (Bilateral) (R>L) 05/16/2020   Chronic lower extremity pain (Left) 05/16/2020   Numbness and tingling of both feet (intermittent/recurrent) 05/16/2020   Pain in rib (Bilateral) (R>L) (intermittent) 05/16/2020   Chronic knee pain (Left) 05/16/2020   Patellar pain (Left) (chronic, intermittent) 05/16/2020   Hard of hearing 05/16/2020   Carcinoma, lung, 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/25/2019   S/P partial lobectomy of lung 02/25/2019   Mass of upper lobe of right lung 01/06/2019   Chronic nonintractable headache 06/14/2018   Head ache 12/03/2017   Sinus bradycardia 10/06/2017   Chronic low back pain (1ry area of Pain) (Bilateral) (L>R) w/o sciatica 08/17/2017   Panic attack as reaction to stress 08/17/2017   Anxiety 06/15/2017   Atypical chest pain 06/15/2017   Hyperlipidemia 06/15/2017   Cholecystitis with cholelithiasis 02/27/2016   Increased frequency of urination 01/27/2015   History of night  sweats 05/01/2014   Dupuytren's contracture 07/18/2013   Inverted nipple 04/18/2013   Depression 04/23/2010   Fibromyalgia 04/23/2010   Hypercholesterolemia 04/23/2010   Essential hypertension 04/23/2010   Hypothyroidism 04/23/2010   Irritable colon 04/23/2010   Osteoarthritis 04/23/2010    PCP: Cain Sieve, MD  REFERRING PROVIDER: Cain Sieve, MD  REFERRING DIAG: Chronic low back pain; At risk for falls  Rationale for Evaluation and Treatment: Rehabilitation  THERAPY DIAG:  Abnormality of gait and mobility  Difficulty in  walking, not elsewhere classified  Muscle weakness (generalized)  Other lack of coordination  Unsteadiness on feet  Chronic bilateral low back pain without sciatica  Other abnormalities of gait and mobility  Chronic left-sided low back pain without sciatica  ONSET DATE: chronic but worse since Jan 2024  SUBJECTIVE:                                                                                                                                                                                           SUBJECTIVE STATEMENT: Patient reports some left upper arm pain after last visit- States unsure what she did but better today. States her back is doing pretty well today.  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 She has received PT in the past for condition with positive results and currently using pain management strategies including pain meds, Heat, Exercises, Yoga, lidocain patch, diclofenac topical gel, and biofreeze. She was HTN controlled with medication.  She reports still lives alone but having trouble with community and social outings and having to modify how she performs her ADLs and housework due to her chronic pain. PMH- Spondylosis, Scoliosis, HTN, Lung CA.  PAIN:  Are you having pain? Yes 2/10 across low back, Right flank  PRECAUTIONS: Fall  WEIGHT BEARING RESTRICTIONS: No  FALLS:  Has patient fallen in last 6 months? No- However patient reports some balance issues and near falls- using cane with community outings  PLOF: Independent  PATIENT GOALS: To decrease my pain  NEXT MD VISIT: 10/06/2022- Dr. Waylan Boga at [facility]- for right hand  OBJECTIVE:   TODAY'S TREATMENT:  DATE: 10/08/22  Manual therapy: each hold 30 sec x 5 each LE  Knee to chest Hip  ER/IR Long axis distraction Hamstring Piriformis  Lower trunk rotation   Therex:   Instructed in the following for QL stretching: (added to HEP) - see HEP section  Supine with lower body rotated and the opp UE reaching back- hold 30 sec x 3 each side Standing QL stretch at doorway- hold 30 sec x 3 each way Discussed standing QL stretch against wall with small ball for mobilization/soft tissue mob   PATIENT EDUCATION:  Education details: PT plan of care; discussion of anatomy and how scoliosis affects the low back musculature.  Person educated: Patient Education method: Explanation, Demonstration, Tactile cues, and Verbal cues Education comprehension: verbalized understanding, returned demonstration, verbal cues required, and tactile cues required  HOME EXERCISE PROGRAM: Access Code: [email or web address] URL: [email or web address] Date: 10/09/2022 Prepared by: Maureen Ralphs  Exercises - Standing Quadratus Lumborum Stretch with Doorway  - 1 x daily - 7 x weekly - 3 sets - 20-30 sec hold - Standing Quadratus Lumborum Mobilization with Small Ball on Wall  - 1 x daily - 7 x weekly - 3 sets - 10 reps - Supine Quadratus Lumborum Stretch  - 1 x daily - 7 x weekly - 3 sets - 20-30 hold  ASSESSMENT:  CLINICAL IMPRESSION: Patient performed well overall today- responded well to manual stretching stating feeling better. She was instructed in some quadratus lumborum stretching with good results- stating she could really feel a good stretch. She responded well with no increased pain at end of session.  Patient will benefit from skilled PT services to improve her low back pain and functional strength and decrease her risk of falling while improving her quality of life.    OBJECTIVE IMPAIRMENTS: Abnormal gait, decreased activity tolerance, decreased balance, decreased endurance, decreased mobility, difficulty walking, decreased strength, and pain.   ACTIVITY LIMITATIONS: carrying,  lifting, bending, sitting, standing, squatting, sleeping, stairs, transfers, and bed mobility  PARTICIPATION LIMITATIONS: cleaning, laundry, shopping, community activity, and yard work  PERSONAL FACTORS: Age, Time since onset of injury/illness/exacerbation, and 3+ comorbidities: scoliosis, HTN, degenerative spine  are also affecting patient's functional outcome.   REHAB POTENTIAL: Good  CLINICAL DECISION MAKING: Stable/uncomplicated  EVALUATION COMPLEXITY: Moderate   GOALS: Goals reviewed with patient? Yes  SHORT TERM GOALS: Target date: 10/24/2022  Pt will decrease worst back pain as reported on NPRS by at least 2 points in order to demonstrate clinically significant reduction in back pain.  Baseline: Eval= 8/10 Goal status: INITIAL  LONG TERM GOALS: Target date: 12/04/2022  Pt will be independent with Final HEP in order to improve strength and decrease back pain in order to improve pain-free function at home and work.  Baseline: EVAL= Patient reports needs update and review of exercises to assist with her back pain Goal status: INITIAL  2.  Pt will decrease worst back pain as reported on NPRS by at least 3 points in order to demonstrate clinically significant reduction in back pain.  Baseline: EVAL= 8/10 Goal status: INITIAL  3.  Pt will decrease mODI score by at least 13 points in order demonstrate clinically significant reduction in back pain/disability.   Baseline: EVAL=58% Goal status: INITIAL  4.  Pt will improve FOTO to target score of >55 to display perceived improvements in ability to complete ADL's.  Baseline: EVAL= 54 Goal status: INITIAL  5.  Patient (> 5 years old) will complete five times sit to stand test in <  15 seconds indicating an increased LE strength and improved balance.  Baseline: EVAL = 20.33 Goal status: INITIAL  6.  Pt will increase by at least 0.13 m/s in order to demonstrate clinically significant improvement in community ambulation.    Baseline: EVAL = 0.97 m/s Goal status: INITIAL 7.  Patient will report ability to complete > or equal to 60 min grocery store outing or similar community activiities to return to her previous level of function.  Baseline: EVAL: patient reports difficulty tolerating outings in community. Goal status: INITIAL  8. Pt will improve BERG by at least 3 points in order to demonstrate clinically significant improvement in balance.    Baseline: 09/30/2022= 47/56  Goal status: INITIAL  9. Pt will increase by at least 46m (126ft) in order to demonstrate clinically significant improvement in cardiopulmonary endurance and community ambulation   Baseline: 09/23/2022= use of SPC RUE (pt preference): starting pain 4/10, then 5/10 at 364ft, and 6/10 at 571ft: audible RR dyspnea. Test terminated after 634ft, 3 minutes 15 seconds   Goal status: INITIAL PLAN:  PT FREQUENCY: 1-2x/week  PT DURATION: 12 weeks  PLANNED INTERVENTIONS: Therapeutic exercises, Therapeutic activity, Neuromuscular re-education, Balance training, Gait training, Patient/Family education, Self Care, Joint mobilization, Joint manipulation, Stair training, Vestibular training, Canalith repositioning, Orthotic/Fit training, DME instructions, Dry Needling, Electrical stimulation, Spinal manipulation, Spinal mobilization, Cryotherapy, Moist heat, scar mobilization, Taping, and Manual therapy.  PLAN FOR NEXT SESSION: Continue to monitor vitals to add data for BP values. Scoliosis biased exercises and core strength, dynamic balance training.        Lenda Kelp, PT 10/09/2022, 3:43 PM   3:43 PM, 10/09/22 Physical Therapist - [facility]  Outpatient Physical Therapy- Main Campus [phone]    3:43 PM, 10/09/22

## 2022-10-13 ENCOUNTER — Ambulatory Visit: Payer: Medicare HMO

## 2022-10-13 DIAGNOSIS — R269 Unspecified abnormalities of gait and mobility: Secondary | ICD-10-CM | POA: Diagnosis not present

## 2022-10-13 DIAGNOSIS — R278 Other lack of coordination: Secondary | ICD-10-CM

## 2022-10-13 DIAGNOSIS — M6281 Muscle weakness (generalized): Secondary | ICD-10-CM

## 2022-10-13 DIAGNOSIS — R262 Difficulty in walking, not elsewhere classified: Secondary | ICD-10-CM

## 2022-10-13 DIAGNOSIS — R2681 Unsteadiness on feet: Secondary | ICD-10-CM

## 2022-10-13 DIAGNOSIS — M545 Low back pain, unspecified: Secondary | ICD-10-CM

## 2022-10-13 DIAGNOSIS — R2689 Other abnormalities of gait and mobility: Secondary | ICD-10-CM

## 2022-10-13 DIAGNOSIS — G8929 Other chronic pain: Secondary | ICD-10-CM

## 2022-10-13 NOTE — Therapy (Signed)
OUTPATIENT PHYSICAL THERAPY TREATMENT   Patient Name: Kendra Benton MRN: [id number] DOB:1941/03/10, 82 y.o., female Today's Date: 10/09/2022  END OF SESSION:  PT End of Session - 10/09/22 0859     Visit Number 6    Number of Visits 24    Date for PT Re-Evaluation 12/04/22    Authorization Type [employer] primary;  medicaid secondary    Authorization Time Period 09/11/22-12/04/22    Progress Note Due on Visit 10    PT Start Time 0854    PT Stop Time 0927    PT Time Calculation (min) 33 min    Equipment Utilized During Treatment Gait belt    Activity Tolerance Patient tolerated treatment well;Patient limited by pain    Behavior During Therapy WFL for tasks assessed/performed              Past Medical History:  Diagnosis Date   Allergy    Anxiety    GERD (gastroesophageal reflux disease)    Headache    History of kidney stones    Hypertension    Myocardial infarction (HCC)    1997   Osteoporosis    Pneumonia    Past Surgical History:  Procedure Laterality Date   ABDOMINAL HYSTERECTOMY     patient has ovaries   APPENDECTOMY     ARTERY BIOPSY Right 12/30/2017   Procedure: BIOPSY TEMPORAL ARTERY;  Surgeon: Renford Dills, MD;  Location: [facility];  Service: Vascular;  Laterality: Right;   ARTERY BIOPSY Left 06/18/2018   Procedure: BIOPSY TEMPORAL ARTERY;  Surgeon: Renford Dills, MD;  Location: [facility];  Service: Vascular;  Laterality: Left;   BACK SURGERY     CARDIAC CATHETERIZATION     CATARACT EXTRACTION W/ INTRAOCULAR LENS  IMPLANT, BILATERAL Bilateral 2010   CHOLECYSTECTOMY N/A 02/28/2016   Procedure: LAPAROSCOPIC CHOLECYSTECTOMY WITH INTRAOPERATIVE CHOLANGIOGRAM;  Surgeon: Tiney Rouge III, MD;  Location: [facility];  Service: General;  Laterality: N/A;   LUMBAR FUSION  11/09/2016   L3-L5   SPINE SURGERY     Disc   TONSILLECTOMY     Patient Active Problem List   Diagnosis Date Noted   Fall (on) (from) other stairs and steps, initial encounter  08/06/2020   Lumbar facet joint syndrome 06/18/2020   Spondylosis without myelopathy or radiculopathy, lumbosacral region 06/18/2020   DDD (degenerative disc disease), lumbosacral 06/18/2020   COVID 06/04/2020   Chronic pain syndrome 05/16/2020   Pharmacologic therapy 05/16/2020   Disorder of skeletal system 05/16/2020   Problems influencing health status 05/16/2020   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   Chronic hip pain (2ry area of Pain) (Bilateral) (R>L) 05/16/2020   Chronic lower extremity pain (Left) 05/16/2020   Numbness and tingling of both feet (intermittent/recurrent) 05/16/2020   Pain in rib (Bilateral) (R>L) (intermittent) 05/16/2020   Chronic knee pain (Left) 05/16/2020   Patellar pain (Left) (chronic, intermittent) 05/16/2020   Hard of hearing 05/16/2020   Carcinoma, lung, 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/25/2019   S/P partial lobectomy of lung 02/25/2019   Mass of upper lobe of right lung 01/06/2019   Chronic nonintractable headache 06/14/2018   Head ache 12/03/2017   Sinus bradycardia 10/06/2017   Chronic low back pain (1ry area of Pain) (Bilateral) (L>R) w/o sciatica 08/17/2017   Panic attack as reaction to stress 08/17/2017   Anxiety 06/15/2017   Atypical chest pain 06/15/2017   Hyperlipidemia 06/15/2017   Cholecystitis with cholelithiasis 02/27/2016   Increased frequency of urination 01/27/2015   History of night  sweats 05/01/2014   Dupuytren's contracture 07/18/2013   Inverted nipple 04/18/2013   Depression 04/23/2010   Fibromyalgia 04/23/2010   Hypercholesterolemia 04/23/2010   Essential hypertension 04/23/2010   Hypothyroidism 04/23/2010   Irritable colon 04/23/2010   Osteoarthritis 04/23/2010    PCP: Cain Sieve, MD  REFERRING PROVIDER: Cain Sieve, MD  REFERRING DIAG: Chronic low back pain; At risk for falls  Rationale for Evaluation and Treatment: Rehabilitation  THERAPY DIAG:  Abnormality of gait and mobility  Difficulty in  walking, not elsewhere classified  Muscle weakness (generalized)  Other lack of coordination  Unsteadiness on feet  Chronic bilateral low back pain without sciatica  Other abnormalities of gait and mobility  Chronic left-sided low back pain without sciatica  ONSET DATE: chronic but worse since Jan 2024  SUBJECTIVE:                                                                                                                                                                                           SUBJECTIVE STATEMENT: Patient reports continued  left upper arm pain overall. Reports able to attend Yoga class without significant difficulty this morning.  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 She has received PT in the past for condition with positive results and currently using pain management strategies including pain meds, Heat, Exercises, Yoga, lidocain patch, diclofenac topical gel, and biofreeze. She was HTN controlled with medication.  She reports still lives alone but having trouble with community and social outings and having to modify how she performs her ADLs and housework due to her chronic pain. PMH- Spondylosis, Scoliosis, HTN, Lung CA.  PAIN:  Are you having pain? Yes 4/10 across low back, Right flank  PRECAUTIONS: Fall  WEIGHT BEARING RESTRICTIONS: No  FALLS:  Has patient fallen in last 6 months? No- However patient reports some balance issues and near falls- using cane with community outings  PLOF: Independent  PATIENT GOALS: To decrease my pain  NEXT MD VISIT: 10/06/2022- Dr. Waylan Boga at [facility]- for right hand  OBJECTIVE:   TODAY'S TREATMENT:  DATE: 10/13/22  Therex:   Supine Lumbar rotation- active x 25 reps Supine Hip IR alt each LE x 25  Reps (feet wide on mat)  Supine Hip ER (Butterfly) x 25 reps  Supine with lower body rotated and the opp UE reaching back- hold 30 sec x 3 each side Muscle energy technique- SI joint 90/90 [place]e using PVC pipe- hold 5 sec x 10 reps each.  Standing QL stretch at doorway- hold 30 sec x 3 each way Supine Hip abd (isometric using taught gait belt around distal thighs)  x 5 sec x 15 reps Standing side bend with 5# DB- x 12 reps on right.   PATIENT EDUCATION:  Education details: PT plan of care; discussion of anatomy and how scoliosis affects the low back musculature.  Person educated: Patient Education method: Explanation, Demonstration, Tactile cues, and Verbal cues Education comprehension: verbalized understanding, returned demonstration, verbal cues required, and tactile cues required  HOME EXERCISE PROGRAM: Access Code: [email or web address] URL: [email or web address] Date: 10/13/2022 Prepared by: Maureen Ralphs  Exercises - Standing Quadratus Lumborum Stretch with Doorway  - 1 x daily - 7 x weekly - 3 sets - 20-30 sec hold - Standing Quadratus Lumborum Mobilization with Small Ball on Wall  - 1 x daily - 7 x weekly - 3 sets - 10 reps - Supine Quadratus Lumborum Stretch  - 1 x daily - 7 x weekly - 3 sets - 20-30 hold - 90/90 SI Joint Self-Correction with Dowel  - 1 x daily - 7 x weekly - 3 sets - 10 reps - Hooklying Isometric Hip Abduction with Belt  - 1 x daily - 7 x weekly - 3 sets - 10 reps - 5 hold    Access Code: [email or web address] URL: [email or web address] Date: 10/09/2022 Prepared by: Maureen Ralphs  Exercises - Standing Quadratus Lumborum Stretch with Doorway  - 1 x daily - 7 x weekly - 3 sets - 20-30 sec hold - Standing Quadratus Lumborum Mobilization with Small Ball on Wall  - 1 x daily - 7 x weekly - 3 sets - 10 reps - Supine Quadratus Lumborum Stretch  - 1 x daily - 7 x weekly - 3 sets - 20-30 hold  ASSESSMENT:  CLINICAL IMPRESSION: Patient  continues to perform well with all core/hip strengthening today without report of increased low back pain. She responded well - able to follow VC and good recall from previous instruction. No pain today with SI mobs using PVC pipe. Patient will benefit from skilled PT services to improve her low back pain and functional strength and decrease her risk of falling while improving her quality of life.    OBJECTIVE IMPAIRMENTS: Abnormal gait, decreased activity tolerance, decreased balance, decreased endurance, decreased mobility, difficulty walking, decreased strength, and pain.   ACTIVITY LIMITATIONS: carrying, lifting, bending, sitting, standing, squatting, sleeping, stairs, transfers, and bed mobility  PARTICIPATION LIMITATIONS: cleaning, laundry, shopping, community activity, and yard work  PERSONAL FACTORS: Age, Time since onset of injury/illness/exacerbation, and 3+ comorbidities: scoliosis, HTN, degenerative spine  are also affecting patient's functional outcome.   REHAB POTENTIAL: Good  CLINICAL DECISION MAKING: Stable/uncomplicated  EVALUATION COMPLEXITY: Moderate   GOALS: Goals reviewed with patient? Yes  SHORT TERM GOALS: Target date: 10/24/2022  Pt will decrease worst back pain as reported on NPRS by at least 2 points in order to demonstrate clinically significant reduction in back pain.  Baseline: Eval= 8/10 Goal status: INITIAL  LONG TERM GOALS: Target date: 12/04/2022  Pt will be independent with Final HEP  in order to improve strength and decrease back pain in order to improve pain-free function at home and work.  Baseline: EVAL= Patient reports needs update and review of exercises to assist with her back pain Goal status: INITIAL  2.  Pt will decrease worst back pain as reported on NPRS by at least 3 points in order to demonstrate clinically significant reduction in back pain.  Baseline: EVAL= 8/10 Goal status: INITIAL  3.  Pt will decrease mODI score by at least 13  points in order demonstrate clinically significant reduction in back pain/disability.   Baseline: EVAL=58% Goal status: INITIAL  4.  Pt will improve FOTO to target score of >55 to display perceived improvements in ability to complete ADL's.  Baseline: EVAL= 54 Goal status: INITIAL  5.  Patient (> 16 years old) will complete five times sit to stand test in <15 seconds indicating an increased LE strength and improved balance.  Baseline: EVAL = 20.33 Goal status: INITIAL  6.  Pt will increase by at least 0.13 m/s in order to demonstrate clinically significant improvement in community ambulation.   Baseline: EVAL = 0.97 m/s Goal status: INITIAL 7.  Patient will report ability to complete > or equal to 60 min grocery store outing or similar community activiities to return to her previous level of function.  Baseline: EVAL: patient reports difficulty tolerating outings in community. Goal status: INITIAL  8. Pt will improve BERG by at least 3 points in order to demonstrate clinically significant improvement in balance.    Baseline: 09/30/2022= 47/56  Goal status: INITIAL  9. Pt will increase by at least 75m (166ft) in order to demonstrate clinically significant improvement in cardiopulmonary endurance and community ambulation   Baseline: 09/23/2022= use of SPC RUE (pt preference): starting pain 4/10, then 5/10 at 356ft, and 6/10 at 547ft: audible RR dyspnea. Test terminated after 671ft, 3 minutes 15 seconds   Goal status: INITIAL PLAN:  PT FREQUENCY: 1-2x/week  PT DURATION: 12 weeks  PLANNED INTERVENTIONS: Therapeutic exercises, Therapeutic activity, Neuromuscular re-education, Balance training, Gait training, Patient/Family education, Self Care, Joint mobilization, Joint manipulation, Stair training, Vestibular training, Canalith repositioning, Orthotic/Fit training, DME instructions, Dry Needling, Electrical stimulation, Spinal manipulation, Spinal mobilization, Cryotherapy, Moist  heat, scar mobilization, Taping, and Manual therapy.  PLAN FOR NEXT SESSION: Continue to monitor vitals to add data for BP values. Scoliosis biased exercises and core strength, dynamic balance training.        Lenda Kelp, PT 10/09/2022, 3:43 PM   3:43 PM, 10/09/22 Physical Therapist - [facility]  Outpatient Physical Therapy- Main [facility] [phone]    3:43 PM, 10/09/22

## 2022-10-14 ENCOUNTER — Ambulatory Visit: Payer: Medicare HMO

## 2022-10-16 ENCOUNTER — Ambulatory Visit: Payer: Medicare HMO

## 2022-10-16 DIAGNOSIS — G8929 Other chronic pain: Secondary | ICD-10-CM

## 2022-10-16 DIAGNOSIS — R2681 Unsteadiness on feet: Secondary | ICD-10-CM

## 2022-10-16 DIAGNOSIS — R262 Difficulty in walking, not elsewhere classified: Secondary | ICD-10-CM

## 2022-10-16 DIAGNOSIS — R2689 Other abnormalities of gait and mobility: Secondary | ICD-10-CM

## 2022-10-16 DIAGNOSIS — R278 Other lack of coordination: Secondary | ICD-10-CM

## 2022-10-16 DIAGNOSIS — R269 Unspecified abnormalities of gait and mobility: Secondary | ICD-10-CM

## 2022-10-16 DIAGNOSIS — M6281 Muscle weakness (generalized): Secondary | ICD-10-CM

## 2022-10-16 NOTE — Therapy (Addendum)
OUTPATIENT PHYSICAL THERAPY TREATMENT- NON Billable Visit   Patient Name: Kendra Benton MRN: [id number] DOB:January 20, 1941, 82 y.o., female Today's Date: 10/16/2022  END OF SESSION:  PT End of Session - 10/16/22 1603     Visit Number 8    Number of Visits 24    Date for PT Re-Evaluation 12/04/22    Authorization Type [employer] primary; [place] medicaid secondary    Authorization Time Period 09/11/22-12/04/22    Progress Note Due on Visit 10    PT Start Time 1559    PT Stop Time 1640    PT Time Calculation (min) 41 min    Equipment Utilized During Treatment Gait belt    Activity Tolerance Patient tolerated treatment well;Patient limited by pain    Behavior During Therapy WFL for tasks assessed/performed              Past Medical History:  Diagnosis Date   Allergy    Anxiety    GERD (gastroesophageal reflux disease)    Headache    History of kidney stones    Hypertension    Myocardial infarction (HCC)    1997   Osteoporosis    Pneumonia    Past Surgical History:  Procedure Laterality Date   ABDOMINAL HYSTERECTOMY     patient has ovaries   APPENDECTOMY     ARTERY BIOPSY Right 12/30/2017   Procedure: BIOPSY TEMPORAL ARTERY;  Surgeon: Renford Dills, MD;  Location: [facility];  Service: Vascular;  Laterality: Right;   ARTERY BIOPSY Left 06/18/2018   Procedure: BIOPSY TEMPORAL ARTERY;  Surgeon: Renford Dills, MD;  Location: [facility];  Service: Vascular;  Laterality: Left;   BACK SURGERY     CARDIAC CATHETERIZATION     CATARACT EXTRACTION W/ INTRAOCULAR LENS  IMPLANT, BILATERAL Bilateral 2010   CHOLECYSTECTOMY N/A 02/28/2016   Procedure: LAPAROSCOPIC CHOLECYSTECTOMY WITH INTRAOPERATIVE CHOLANGIOGRAM;  Surgeon: Tiney Rouge III, MD;  Location: [facility];  Service: General;  Laterality: N/A;   LUMBAR FUSION  11/09/2016   L3-L5   SPINE SURGERY     Disc   TONSILLECTOMY     Patient Active Problem List   Diagnosis Date Noted   Fall (on) (from) other stairs and steps,  initial encounter 08/06/2020   Lumbar facet joint syndrome 06/18/2020   Spondylosis without myelopathy or radiculopathy, lumbosacral region 06/18/2020   DDD (degenerative disc disease), lumbosacral 06/18/2020   COVID 06/04/2020   Chronic pain syndrome 05/16/2020   Pharmacologic therapy 05/16/2020   Disorder of skeletal system 05/16/2020   Problems influencing health status 05/16/2020   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   Chronic hip pain (2ry area of Pain) (Bilateral) (R>L) 05/16/2020   Chronic lower extremity pain (Left) 05/16/2020   Numbness and tingling of both feet (intermittent/recurrent) 05/16/2020   Pain in rib (Bilateral) (R>L) (intermittent) 05/16/2020   Chronic knee pain (Left) 05/16/2020   Patellar pain (Left) (chronic, intermittent) 05/16/2020   Hard of hearing 05/16/2020   Carcinoma, lung, 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/25/2019   S/P partial lobectomy of lung 02/25/2019   Mass of upper lobe of right lung 01/06/2019   Chronic nonintractable headache 06/14/2018   Head ache 12/03/2017   Sinus bradycardia 10/06/2017   Chronic low back pain (1ry area of Pain) (Bilateral) (L>R) w/o sciatica 08/17/2017   Panic attack as reaction to stress 08/17/2017   Anxiety 06/15/2017   Atypical chest pain 06/15/2017   Hyperlipidemia 06/15/2017   Cholecystitis with cholelithiasis 02/27/2016   Increased frequency of urination 01/27/2015  History of night sweats 05/01/2014   Dupuytren's contracture 07/18/2013   Inverted nipple 04/18/2013   Depression 04/23/2010   Fibromyalgia 04/23/2010   Hypercholesterolemia 04/23/2010   Essential hypertension 04/23/2010   Hypothyroidism 04/23/2010   Irritable colon 04/23/2010   Osteoarthritis 04/23/2010    PCP: Cain Sieve, MD  REFERRING PROVIDER: Cain Sieve, MD  REFERRING DIAG: Chronic low back pain; At risk for falls  Rationale for Evaluation and Treatment: Rehabilitation  THERAPY DIAG:  Abnormality of gait and  mobility  Difficulty in walking, not elsewhere classified  Muscle weakness (generalized)  Other lack of coordination  Unsteadiness on feet  Chronic bilateral low back pain without sciatica  Other abnormalities of gait and mobility  ONSET DATE: chronic but worse since Jan 2024  SUBJECTIVE:                                                                                                                                                                                           SUBJECTIVE STATEMENT: Patient reports she has not felt well all day- States slept well but woke up feeling bad. She reports difficulty with   PERTINENT HISTORY:  Patient is a 82 year old female with diagnosis of chronic low back pain with scoliosis and degenerative lumbar spine. She has received PT in the past for condition with positive results and currently using pain management strategies including pain meds, Heat, Exercises, Yoga, lidocain patch, diclofenac topical gel, and biofreeze. She was HTN controlled with medication.  She reports still lives alone but having trouble with community and social outings and having to modify how she performs her ADLs and housework due to her chronic pain. PMH- Spondylosis, Scoliosis, HTN, Lung CA.  PAIN:  Are you having pain? Yes 4/10 across low back, Right flank  PRECAUTIONS: Fall  WEIGHT BEARING RESTRICTIONS: No  FALLS:  Has patient fallen in last 6 months? No- However patient reports some balance issues and near falls- using cane with community outings  PLOF: Independent  PATIENT GOALS: To decrease my pain  NEXT MD VISIT: 10/06/2022- Dr[facility] Waylan [facility]- for right hand  OBJECTIVE:   TODAY'S TREATMENT:  DATE: 10/16/22  BP taken in supine= 168/62 mmHg Right UE resting Retested after 3 min= 166/63 mmHg Right UE  Rested 3 more min then sat up and  = 144/66 mmHg  Retested after 5 min in standing- 129/57 mmHg Rested after 5 min  Patient did not report dizziness but stated she just doesn't feel good- reports some pressure in her head and some light ringing in her hear- states symptoms developed this morning  Phone call to PCP- Dr. Tera Helper to inform of patient subjective info and above values- Spoke with nurse-  Marjean Donna who responded MD office recommending patient go to ED.  Relayed information to Patient and she adamantly denied going to ED. She did state she would contact her daughter or a friend to inform and seek ride home. Advised patient that Thereasa Parkin would also recommend ED based on symptoms of feeling rough/week and head pressure with light ringing.   *after visit - Author went upstair and patient was in lobby and stated feeling okay and that her grandson was coming to pick her up.   PATIENT EDUCATION:  Education details: PT plan of care; discussion of anatomy and how scoliosis affects the low back musculature.  Person educated: Patient Education method: Explanation, Demonstration, Tactile cues, and Verbal cues Education comprehension: verbalized understanding, returned demonstration, verbal cues required, and tactile cues required  HOME EXERCISE PROGRAM: Access Code: [email or web address] URL: [email or web address] Date: 10/13/2022 Prepared by: Maureen Ralphs  Exercises - Standing Quadratus Lumborum Stretch with Doorway  - 1 x daily - 7 x weekly - 3 sets - 20-30 sec hold - Standing Quadratus Lumborum Mobilization with Small Ball on Wall  - 1 x daily - 7 x weekly - 3 sets - 10 reps - Supine Quadratus Lumborum Stretch  - 1 x daily - 7 x weekly - 3 sets - 20-30 hold - 90/90 SI Joint Self-Correction with Dowel  - 1 x daily - 7 x weekly - 3 sets - 10 reps - Hooklying Isometric Hip Abduction with Belt  - 1 x daily - 7 x weekly - 3 sets - 10 reps - 5 hold    Access Code: [email or web address] URL:  [email or web address] Date: 10/09/2022 Prepared by: Maureen Ralphs  Exercises - Standing Quadratus Lumborum Stretch with Doorway  - 1 x daily - 7 x weekly - 3 sets - 20-30 sec hold - Standing Quadratus Lumborum Mobilization with Small Ball on Wall  - 1 x daily - 7 x weekly - 3 sets - 10 reps - Supine Quadratus Lumborum Stretch  - 1 x daily - 7 x weekly - 3 sets - 20-30 hold  ASSESSMENT:  CLINICAL IMPRESSION: Patient arrived today not feeling well with known orthostatic issues. Her symptoms initially were vague- but had her lay down and began testing blood pressure and she exhibited orthostatic BP with symptoms including sinus pressure and light ringing in ears. Phone call to PCP recommended ED visit but patient declined. She did arrange her grandson to pick her up rather than drive home. Patient will benefit from skilled PT services to improve her low back pain and functional strength and decrease her risk of falling while improving her quality of life.    OBJECTIVE IMPAIRMENTS: Abnormal gait, decreased activity tolerance, decreased balance, decreased endurance, decreased mobility, difficulty walking, decreased strength, and pain.   ACTIVITY LIMITATIONS: carrying, lifting, bending, sitting, standing, squatting, sleeping, stairs, transfers, and bed mobility  PARTICIPATION LIMITATIONS: cleaning, laundry, shopping, community activity, and yard work  PERSONAL FACTORS: Age, Time since onset  of injury/illness/exacerbation, and 3+ comorbidities: scoliosis, HTN, degenerative spine  are also affecting patient's functional outcome.   REHAB POTENTIAL: Good  CLINICAL DECISION MAKING: Stable/uncomplicated  EVALUATION COMPLEXITY: Moderate   GOALS: Goals reviewed with patient? Yes  SHORT TERM GOALS: Target date: 10/24/2022  Pt will decrease worst back pain as reported on NPRS by at least 2 points in order to demonstrate clinically significant reduction in back pain.  Baseline:  Eval= 8/10 Goal status: INITIAL  LONG TERM GOALS: Target date: 12/04/2022  Pt will be independent with Final HEP in order to improve strength and decrease back pain in order to improve pain-free function at home and work.  Baseline: EVAL= Patient reports needs update and review of exercises to assist with her back pain Goal status: INITIAL  2.  Pt will decrease worst back pain as reported on NPRS by at least 3 points in order to demonstrate clinically significant reduction in back pain.  Baseline: EVAL= 8/10 Goal status: INITIAL  3.  Pt will decrease mODI score by at least 13 points in order demonstrate clinically significant reduction in back pain/disability.   Baseline: EVAL=58% Goal status: INITIAL  4.  Pt will improve FOTO to target score of >55 to display perceived improvements in ability to complete ADL's.  Baseline: EVAL= 54 Goal status: INITIAL  5.  Patient (> 64 years old) will complete five times sit to stand test in <15 seconds indicating an increased LE strength and improved balance.  Baseline: EVAL = 20.33 Goal status: INITIAL  6.  Pt will increase by at least 0.13 m/s in order to demonstrate clinically significant improvement in community ambulation.   Baseline: EVAL = 0.97 m/s Goal status: INITIAL 7.  Patient will report ability to complete > or equal to 60 min grocery store outing or similar community activiities to return to her previous level of function.  Baseline: EVAL: patient reports difficulty tolerating outings in community. Goal status: INITIAL  8. Pt will improve BERG by at least 3 points in order to demonstrate clinically significant improvement in balance.    Baseline: 09/30/2022= 47/56  Goal status: INITIAL  9. Pt will increase by at least 6m (123ft) in order to demonstrate clinically significant improvement in cardiopulmonary endurance and community ambulation   Baseline: 09/23/2022= use of SPC RUE (pt preference): starting pain 4/10, then  5/10 at 328ft, and 6/10 at 550ft: audible RR dyspnea. Test terminated after 656ft, 3 minutes 15 seconds   Goal status: INITIAL PLAN:  PT FREQUENCY: 1-2x/week  PT DURATION: 12 weeks  PLANNED INTERVENTIONS: Therapeutic exercises, Therapeutic activity, Neuromuscular re-education, Balance training, Gait training, Patient/Family education, Self Care, Joint mobilization, Joint manipulation, Stair training, Vestibular training, Canalith repositioning, Orthotic/Fit training, DME instructions, Dry Needling, Electrical stimulation, Spinal manipulation, Spinal mobilization, Cryotherapy, Moist heat, scar mobilization, Taping, and Manual therapy.  PLAN FOR NEXT SESSION: Continue to monitor vitals to add data for BP values. Scoliosis biased exercises and core strength, dynamic balance training.        Lenda Kelp, PT 10/16/2022, 5:06 PM   5:06 PM, 10/16/22 Physical Therapist - [facility]  Outpatient Physical Therapy- Main [facility] [phone]    5:06 PM, 10/16/22

## 2022-10-20 ENCOUNTER — Ambulatory Visit: Payer: Medicare HMO

## 2022-10-23 ENCOUNTER — Ambulatory Visit: Payer: Medicare HMO

## 2022-10-29 ENCOUNTER — Ambulatory Visit: Payer: Medicare HMO

## 2022-11-05 ENCOUNTER — Ambulatory Visit: Payer: Medicare HMO

## 2022-11-12 ENCOUNTER — Ambulatory Visit: Payer: Medicare HMO | Attending: Internal Medicine

## 2022-11-12 DIAGNOSIS — M545 Low back pain, unspecified: Secondary | ICD-10-CM | POA: Insufficient documentation

## 2022-11-12 DIAGNOSIS — R2681 Unsteadiness on feet: Secondary | ICD-10-CM | POA: Diagnosis present

## 2022-11-12 DIAGNOSIS — R262 Difficulty in walking, not elsewhere classified: Secondary | ICD-10-CM | POA: Diagnosis present

## 2022-11-12 DIAGNOSIS — R278 Other lack of coordination: Secondary | ICD-10-CM | POA: Diagnosis present

## 2022-11-12 DIAGNOSIS — R2689 Other abnormalities of gait and mobility: Secondary | ICD-10-CM | POA: Diagnosis present

## 2022-11-12 DIAGNOSIS — M6281 Muscle weakness (generalized): Secondary | ICD-10-CM | POA: Diagnosis present

## 2022-11-12 DIAGNOSIS — R269 Unspecified abnormalities of gait and mobility: Secondary | ICD-10-CM | POA: Insufficient documentation

## 2022-11-12 DIAGNOSIS — G8929 Other chronic pain: Secondary | ICD-10-CM | POA: Insufficient documentation

## 2022-11-12 NOTE — Therapy (Signed)
OUTPATIENT PHYSICAL THERAPY TREATMENT   Patient Name: Kendra Benton MRN: [id number] DOB:[birth year], 82 y.o., female Today's Date: 11/12/2022  END OF SESSION:  PT End of Session - 11/12/22 1355     Visit Number 8    Number of Visits 24    Date for PT Re-Evaluation 12/04/22    Authorization Type [employer] primary; [place] medicaid secondary    Authorization Time Period 09/11/22-12/04/22    Progress Note Due on Visit 10    PT Start Time 1352    PT Stop Time 1430    PT Time Calculation (min) 38 min    Equipment Utilized During Treatment Gait belt    Activity Tolerance Patient tolerated treatment well;Patient limited by pain    Behavior During Therapy WFL for tasks assessed/performed              Past Medical History:  Diagnosis Date   Allergy    Anxiety    GERD (gastroesophageal reflux disease)    Headache    History of kidney stones    Hypertension    Myocardial infarction (HCC)    1997   Osteoporosis    Pneumonia    Past Surgical History:  Procedure Laterality Date   ABDOMINAL HYSTERECTOMY     patient has ovaries   APPENDECTOMY     ARTERY BIOPSY Right 12/30/2017   Procedure: BIOPSY TEMPORAL ARTERY;  Surgeon: Renford Dills, MD;  Location: [facility];  Service: Vascular;  Laterality: Right;   ARTERY BIOPSY Left 06/18/2018   Procedure: BIOPSY TEMPORAL ARTERY;  Surgeon: Renford Dills, MD;  Location: [facility];  Service: Vascular;  Laterality: Left;   BACK SURGERY     CARDIAC CATHETERIZATION     CATARACT EXTRACTION W/ INTRAOCULAR LENS  IMPLANT, BILATERAL Bilateral 2010   CHOLECYSTECTOMY N/A 02/28/2016   Procedure: LAPAROSCOPIC CHOLECYSTECTOMY WITH INTRAOPERATIVE CHOLANGIOGRAM;  Surgeon: Tiney Rouge III, MD;  Location: [facility];  Service: General;  Laterality: N/A;   LUMBAR FUSION  11/09/2016   L3-L5   SPINE SURGERY     Disc   TONSILLECTOMY     Patient Active Problem List   Diagnosis Date Noted   Fall (on) (from) other stairs and steps, initial encounter  08/06/2020   Lumbar facet joint syndrome 06/18/2020   Spondylosis without myelopathy or radiculopathy, lumbosacral region 06/18/2020   DDD (degenerative disc disease), lumbosacral 06/18/2020   COVID 06/04/2020   Chronic pain syndrome 05/16/2020   Pharmacologic therapy 05/16/2020   Disorder of skeletal system 05/16/2020   Problems influencing health status 05/16/2020   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   Chronic hip pain (2ry area of Pain) (Bilateral) (R>L) 05/16/2020   Chronic lower extremity pain (Left) 05/16/2020   Numbness and tingling of both feet (intermittent/recurrent) 05/16/2020   Pain in rib (Bilateral) (R>L) (intermittent) 05/16/2020   Chronic knee pain (Left) 05/16/2020   Patellar pain (Left) (chronic, intermittent) 05/16/2020   Hard of hearing 05/16/2020   Carcinoma, lung, 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/25/2019   S/P partial lobectomy of lung 02/25/2019   Mass of upper lobe of right lung 01/06/2019   Chronic nonintractable headache 06/14/2018   Head ache 12/03/2017   Sinus bradycardia 10/06/2017   Chronic low back pain (1ry area of Pain) (Bilateral) (L>R) w/o sciatica 08/17/2017   Panic attack as reaction to stress 08/17/2017   Anxiety 06/15/2017   Atypical chest pain 06/15/2017   Hyperlipidemia 06/15/2017   Cholecystitis with cholelithiasis 02/27/2016   Increased frequency of urination 01/27/2015   History of night  sweats 05/01/2014   Dupuytren's contracture 07/18/2013   Inverted nipple 04/18/2013   Depression 04/23/2010   Fibromyalgia 04/23/2010   Hypercholesterolemia 04/23/2010   Essential hypertension 04/23/2010   Hypothyroidism 04/23/2010   Irritable colon 04/23/2010   Osteoarthritis 04/23/2010    PCP: Cain Sieve, MD  REFERRING PROVIDER: Cain Sieve, MD  REFERRING DIAG: Chronic low back pain; At risk for falls  Rationale for Evaluation and Treatment: Rehabilitation  THERAPY DIAG:  Abnormality of gait and mobility  Difficulty in  walking, not elsewhere classified  Muscle weakness (generalized)  Other lack of coordination  Unsteadiness on feet  Other abnormalities of gait and mobility  Chronic bilateral low back pain without sciatica  Chronic left-sided low back pain without sciatica  ONSET DATE: chronic but worse since Jan 2024  SUBJECTIVE:                                                                                                                                                                                           SUBJECTIVE STATEMENT: Patient reports missing several sessions due to car being in shop.   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 She has received PT in the past for condition with positive results and currently using pain management strategies including pain meds, Heat, Exercises, Yoga, lidocain patch, diclofenac topical gel, and biofreeze. She was HTN controlled with medication.  She reports still lives alone but having trouble with community and social outings and having to modify how she performs her ADLs and housework due to her chronic pain. PMH- Spondylosis, Scoliosis, HTN, Lung CA.  PAIN:  Are you having pain? Yes 4/10 across low back, Right flank  PRECAUTIONS: Fall  WEIGHT BEARING RESTRICTIONS: No  FALLS:  Has patient fallen in last 6 months? No- However patient reports some balance issues and near falls- using cane with community outings  PLOF: Independent  PATIENT GOALS: To decrease my pain  NEXT MD VISIT: 10/06/2022- Dr. Waylan Boga at [facility]- for right hand  OBJECTIVE:   TODAY'S TREATMENT:  DATE: 11/12/22  Therex:   Supine Lumbar rotation- active x 25 reps with LE on ball Supine bridging with LE on ball x 15 reps (slow and controlled)   Supine Hip IR  alt each LE x 25 Reps (feet wide on mat)  Supine Hip ER (Butterfly) x 25 reps  Sidelye QL (side stretch) with towel roll under side- arm overhead and top leg off mat) - hold 30 sec  Muscle energy technique- SI joint 90/90 Hooklye using PVC pipe- hold 5 sec x 10 reps each.  Standing QL stretch at doorway- hold 30 sec x 3 each way Supine Hip abd (isometric using taught gait belt around distal thighs)  x 5 sec x 15 reps Standing side bend with 5# DB- x 12 reps on right.  Quadraped bird dog (alt opp UE/LE)  Child's pose -hold 30 sec x 3 Quadraped hip circles- x 10 reps   BP= 169/71  mmHG Left UE after session- mild complaint of dizziness from quadraped to sitting PB= 166/70 mmHg Left UE after standing- no dizziness today.     PATIENT EDUCATION:  Education details: PT plan of care; Exercise technique  Person educated: Patient Education method: Explanation, Demonstration, Tactile cues, and Verbal cues Education comprehension: verbalized understanding, returned demonstration, verbal cues required, and tactile cues required  HOME EXERCISE PROGRAM: Access Code: [email or web address] URL: [email or web address] Date: 10/13/2022 Prepared by: Maureen Ralphs  Exercises - Standing Quadratus Lumborum Stretch with Doorway  - 1 x daily - 7 x weekly - 3 sets - 20-30 sec hold - Standing Quadratus Lumborum Mobilization with Small Ball on Wall  - 1 x daily - 7 x weekly - 3 sets - 10 reps - Supine Quadratus Lumborum Stretch  - 1 x daily - 7 x weekly - 3 sets - 20-30 hold - 90/90 SI Joint Self-Correction with Dowel  - 1 x daily - 7 x weekly - 3 sets - 10 reps - Hooklying Isometric Hip Abduction with Belt  - 1 x daily - 7 x weekly - 3 sets - 10 reps - 5 hold    Access Code: [email or web address] URL: [email or web address] Date: 10/09/2022 Prepared by: Maureen Ralphs  Exercises - Standing Quadratus Lumborum Stretch with Doorway  - 1 x daily - 7 x weekly - 3 sets - 20-30 sec hold -  Standing Quadratus Lumborum Mobilization with Small Ball on Wall  - 1 x daily - 7 x weekly - 3 sets - 10 reps - Supine Quadratus Lumborum Stretch  - 1 x daily - 7 x weekly - 3 sets - 20-30 hold  ASSESSMENT:  CLINICAL IMPRESSION: Patient was able to progress well with core, LE, and low back exercises. She was responsive to all VC and performed well without increased report of pain. No orthostasis  today with BP with changing positions. Patient performed well exhibiting good understanding of therex and readiness to perform unfamiliar activities requiring minimal VC. Patient will benefit from skilled PT services to improve her low back pain and functional strength and decrease her risk of falling while improving her quality of life.    OBJECTIVE IMPAIRMENTS: Abnormal gait, decreased activity tolerance, decreased balance, decreased endurance, decreased mobility, difficulty walking, decreased strength, and pain.   ACTIVITY LIMITATIONS: carrying, lifting, bending, sitting, standing, squatting, sleeping, stairs, transfers, and bed mobility  PARTICIPATION LIMITATIONS: cleaning, laundry, shopping, community activity, and yard work  PERSONAL FACTORS: Age, Time since onset of injury/illness/exacerbation, and 3+ comorbidities: scoliosis, HTN, degenerative spine  are also affecting patient's functional outcome.  REHAB POTENTIAL: Good  CLINICAL DECISION MAKING: Stable/uncomplicated  EVALUATION COMPLEXITY: Moderate   GOALS: Goals reviewed with patient? Yes  SHORT TERM GOALS: Target date: 10/24/2022  Pt will decrease worst back pain as reported on NPRS by at least 2 points in order to demonstrate clinically significant reduction in back pain.  Baseline: Eval= 8/10 Goal status: INITIAL  LONG TERM GOALS: Target date: 12/04/2022  Pt will be independent with Final HEP in order to improve strength and decrease back pain in order to improve pain-free function at home and work.  Baseline: EVAL= Patient  reports needs update and review of exercises to assist with her back pain Goal status: INITIAL  2.  Pt will decrease worst back pain as reported on NPRS by at least 3 points in order to demonstrate clinically significant reduction in back pain.  Baseline: EVAL= 8/10 Goal status: INITIAL  3.  Pt will decrease mODI score by at least 13 points in order demonstrate clinically significant reduction in back pain/disability.   Baseline: EVAL=58% Goal status: INITIAL  4.  Pt will improve FOTO to target score of >55 to display perceived improvements in ability to complete ADL's.  Baseline: EVAL= 54 Goal status: INITIAL  5.  Patient (> 29 years old) will complete five times sit to stand test in <15 seconds indicating an increased LE strength and improved balance.  Baseline: EVAL = 20.33 Goal status: INITIAL  6.  Pt will increase by at least 0.13 m/s in order to demonstrate clinically significant improvement in community ambulation.   Baseline: EVAL = 0.97 m/s Goal status: INITIAL 7.  Patient will report ability to complete > or equal to 60 min grocery store outing or similar community activiities to return to her previous level of function.  Baseline: EVAL: patient reports difficulty tolerating outings in community. Goal status: INITIAL  8. Pt will improve BERG by at least 3 points in order to demonstrate clinically significant improvement in balance.    Baseline: 09/30/2022= 47/56  Goal status: INITIAL  9. Pt will increase by at least 32m (124ft) in order to demonstrate clinically significant improvement in cardiopulmonary endurance and community ambulation   Baseline: 09/23/2022= use of SPC RUE (pt preference): starting pain 4/10, then 5/10 at 366ft, and 6/10 at 514ft: audible RR dyspnea. Test terminated after 643ft, 3 minutes 15 seconds   Goal status: INITIAL PLAN:  PT FREQUENCY: 1-2x/week  PT DURATION: 12 weeks  PLANNED INTERVENTIONS: Therapeutic exercises, Therapeutic  activity, Neuromuscular re-education, Balance training, Gait training, Patient/Family education, Self Care, Joint mobilization, Joint manipulation, Stair training, Vestibular training, Canalith repositioning, Orthotic/Fit training, DME instructions, Dry Needling, Electrical stimulation, Spinal manipulation, Spinal mobilization, Cryotherapy, Moist heat, scar mobilization, Taping, and Manual therapy.  PLAN FOR NEXT SESSION: Continue to monitor vitals to add data for BP values. Scoliosis biased exercises and core strength, dynamic balance training.        Lenda Kelp, PT 11/12/2022, 10:05 PM   10:05 PM, 11/12/22 Physical Therapist - [facility]  Outpatient Physical Therapy- [facility] [phone]    10:05 PM, 11/12/22

## 2022-11-14 ENCOUNTER — Ambulatory Visit: Payer: Medicare HMO

## 2022-11-14 DIAGNOSIS — R2689 Other abnormalities of gait and mobility: Secondary | ICD-10-CM

## 2022-11-14 DIAGNOSIS — R2681 Unsteadiness on feet: Secondary | ICD-10-CM

## 2022-11-14 DIAGNOSIS — M545 Low back pain, unspecified: Secondary | ICD-10-CM

## 2022-11-14 DIAGNOSIS — M6281 Muscle weakness (generalized): Secondary | ICD-10-CM

## 2022-11-14 DIAGNOSIS — R278 Other lack of coordination: Secondary | ICD-10-CM

## 2022-11-14 DIAGNOSIS — R269 Unspecified abnormalities of gait and mobility: Secondary | ICD-10-CM

## 2022-11-14 DIAGNOSIS — R262 Difficulty in walking, not elsewhere classified: Secondary | ICD-10-CM

## 2022-11-14 NOTE — Therapy (Signed)
OUTPATIENT PHYSICAL THERAPY TREATMENT   Patient Name: Kendra Benton MRN: [id number] DOB:12-30-1940, 82 y.o., female Today's Date: 11/14/2022  END OF SESSION:  PT End of Session - 11/14/22 1110     Visit Number 8   8th billed visit   Number of Visits 24    Date for PT Re-Evaluation 12/04/22    Authorization Type [employer] primary; [place] medicaid secondary    Authorization Time Period 09/11/22-12/04/22    Progress Note Due on Visit 10    PT Start Time 1100   late start per Thereasa Parkin   PT Stop Time 1140    PT Time Calculation (min) 40 min    Equipment Utilized During Treatment Gait belt    Activity Tolerance Patient tolerated treatment well;No increased pain    Behavior During Therapy WFL for tasks assessed/performed              Past Medical History:  Diagnosis Date   Allergy    Anxiety    GERD (gastroesophageal reflux disease)    Headache    History of kidney stones    Hypertension    Myocardial infarction (HCC)    1997   Osteoporosis    Pneumonia    Past Surgical History:  Procedure Laterality Date   ABDOMINAL HYSTERECTOMY     patient has ovaries   APPENDECTOMY     ARTERY BIOPSY Right 12/30/2017   Procedure: BIOPSY TEMPORAL ARTERY;  Surgeon: Renford Dills, MD;  Location: [facility];  Service: Vascular;  Laterality: Right;   ARTERY BIOPSY Left 06/18/2018   Procedure: BIOPSY TEMPORAL ARTERY;  Surgeon: Renford Dills, MD;  Location: [facility];  Service: Vascular;  Laterality: Left;   BACK SURGERY     CARDIAC CATHETERIZATION     CATARACT EXTRACTION W/ INTRAOCULAR LENS  IMPLANT, BILATERAL Bilateral 2010   CHOLECYSTECTOMY N/A 02/28/2016   Procedure: LAPAROSCOPIC CHOLECYSTECTOMY WITH INTRAOPERATIVE CHOLANGIOGRAM;  Surgeon: Tiney Rouge III, MD;  Location: [facility];  Service: General;  Laterality: N/A;   LUMBAR FUSION  11/09/2016   L3-L5   SPINE SURGERY     Disc   TONSILLECTOMY     Patient Active Problem List   Diagnosis Date Noted   Fall (on) (from) other stairs  and steps, initial encounter 08/06/2020   Lumbar facet joint syndrome 06/18/2020   Spondylosis without myelopathy or radiculopathy, lumbosacral region 06/18/2020   DDD (degenerative disc disease), lumbosacral 06/18/2020   COVID 06/04/2020   Chronic pain syndrome 05/16/2020   Pharmacologic therapy 05/16/2020   Disorder of skeletal system 05/16/2020   Problems influencing health status 05/16/2020   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   Chronic hip pain (2ry area of Pain) (Bilateral) (R>L) 05/16/2020   Chronic lower extremity pain (Left) 05/16/2020   Numbness and tingling of both feet (intermittent/recurrent) 05/16/2020   Pain in rib (Bilateral) (R>L) (intermittent) 05/16/2020   Chronic knee pain (Left) 05/16/2020   Patellar pain (Left) (chronic, intermittent) 05/16/2020   Hard of hearing 05/16/2020   Carcinoma, lung, 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/25/2019   S/P partial lobectomy of lung 02/25/2019   Mass of upper lobe of right lung 01/06/2019   Chronic nonintractable headache 06/14/2018   Head ache 12/03/2017   Sinus bradycardia 10/06/2017   Chronic low back pain (1ry area of Pain) (Bilateral) (L>R) w/o sciatica 08/17/2017   Panic attack as reaction to stress 08/17/2017   Anxiety 06/15/2017   Atypical chest pain 06/15/2017   Hyperlipidemia 06/15/2017   Cholecystitis with cholelithiasis 02/27/2016   Increased frequency  of urination 01/27/2015   History of night sweats 05/01/2014   Dupuytren's contracture 07/18/2013   Inverted nipple 04/18/2013   Depression 04/23/2010   Fibromyalgia 04/23/2010   Hypercholesterolemia 04/23/2010   Essential hypertension 04/23/2010   Hypothyroidism 04/23/2010   Irritable colon 04/23/2010   Osteoarthritis 04/23/2010    PCP: Cain Sieve, MD  REFERRING PROVIDER: Cain Sieve, MD  REFERRING DIAG: Chronic low back pain; At risk for falls  Rationale for Evaluation and Treatment: Rehabilitation  THERAPY DIAG:  Abnormality of gait and  mobility  Difficulty in walking, not elsewhere classified  Muscle weakness (generalized)  Other lack of coordination  Unsteadiness on feet  Other abnormalities of gait and mobility  Chronic bilateral low back pain without sciatica  Chronic left-sided low back pain without sciatica  ONSET DATE: chronic but worse since Jan 2024  SUBJECTIVE:                                                                                                                                                                                           SUBJECTIVE STATEMENT:  Pt doing well today, no updates. Pain and dizziness about typical. Pt was able to attend yoga this week.   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 She has received PT in the past for condition with positive results and currently using pain management strategies including pain meds, Heat, Exercises, Yoga, lidocain patch, diclofenac topical gel, and biofreeze. She was HTN controlled with medication.  She reports still lives alone but having trouble with community and social outings and having to modify how she performs her ADLs and housework due to her chronic pain. PMH- Spondylosis, Scoliosis, HTN, Lung CA.  PAIN:  Are you having pain? Yes 4/10 across low back, Right flank  PRECAUTIONS: Fall  WEIGHT BEARING RESTRICTIONS: No  FALLS:  Has patient fallen in last 6 months? No- However patient reports some balance issues and near falls- using cane with community outings  PLOF: Independent  PATIENT GOALS: To decrease my pain  NEXT MD VISIT: unknown   OBJECTIVE:   TODAY'S TREATMENT:  DATE: 11/14/22  FOTO survey: 56 Hooklying reverse curlup with ball between knees x12 Hooklying bridge x12 ball between knees Hooklying,  lower trunk rotations c rainbow ball between knees x25 Standing hip ABDCT side step back along wall 20 alternating direction  Standing side bend with 5# DB- 2x12 reps on right.  Quadruped bird dog (alt opp UE/LE) x20 Child's pose -hold 30 sec x 3 Quadruped hip circles- x 10 reps bilat (breaststroke kick or fire hydrant) alternating CW, CCW   PATIENT EDUCATION:  Education details: PT plan of care; Exercise technique  Person educated: Patient Education method: Explanation, Demonstration, Tactile cues, and Verbal cues Education comprehension: verbalized understanding, returned demonstration, verbal cues required, and tactile cues required  HOME EXERCISE PROGRAM: 11/14/22:  Side step c red band around knees, back against wall   Access Code: [email or web address] URL: [email or web address] Date: 10/13/2022 Prepared by: Maureen Ralphs  Exercises - Standing Quadratus Lumborum Stretch with Doorway  - 1 x daily - 7 x weekly - 3 sets - 20-30 sec hold - Standing Quadratus Lumborum Mobilization with Small Ball on Wall  - 1 x daily - 7 x weekly - 3 sets - 10 reps - Supine Quadratus Lumborum Stretch  - 1 x daily - 7 x weekly - 3 sets - 20-30 hold - 90/90 SI Joint Self-Correction with Dowel  - 1 x daily - 7 x weekly - 3 sets - 10 reps - Hooklying Isometric Hip Abduction with Belt  - 1 x daily - 7 x weekly - 3 sets - 10 reps - 5 hold    Access Code: [email or web address] URL: [email or web address] Date: 10/09/2022 Prepared by: Maureen Ralphs  Exercises - Standing Quadratus Lumborum Stretch with Doorway  - 1 x daily - 7 x weekly - 3 sets - 20-30 sec hold - Standing Quadratus Lumborum Mobilization with Small Ball on Wall  - 1 x daily - 7 x weekly - 3 sets - 10 reps - Supine Quadratus Lumborum Stretch  - 1 x daily - 7 x weekly - 3 sets - 20-30 hold  ASSESSMENT:  CLINICAL IMPRESSION: Continued to advance core mobility and ROM program, generally good tolerance overall, used ROM and  stretching at end of session simmer down pain exacerbation from more taxing interventions at start of session. Vitals not assessed today, pt reports orthostatic dizziness problem unchanged recently, which is to say not particularly worse recently. Patient will benefit from skilled PT services to improve her low back pain and functional strength and decrease her risk of falling while improving her quality of life.    OBJECTIVE IMPAIRMENTS: Abnormal gait, decreased activity tolerance, decreased balance, decreased endurance, decreased mobility, difficulty walking, decreased strength, and pain.   ACTIVITY LIMITATIONS: carrying, lifting, bending, sitting, standing, squatting, sleeping, stairs, transfers, and bed mobility  PARTICIPATION LIMITATIONS: cleaning, laundry, shopping, community activity, and yard work  PERSONAL FACTORS: Age, Time since onset of injury/illness/exacerbation, and 3+ comorbidities: scoliosis, HTN, degenerative spine  are also affecting patient's functional outcome.   REHAB POTENTIAL: Good  CLINICAL DECISION MAKING: Stable/uncomplicated  EVALUATION COMPLEXITY: Moderate   GOALS: Goals reviewed with patient? Yes  SHORT TERM GOALS: Target date: 10/24/2022  Pt will decrease worst back pain as reported on NPRS by at least 2 points in order to demonstrate clinically significant reduction in back pain.  Baseline: Eval= 8/10 Goal status: INITIAL  LONG TERM GOALS: Target date: 12/04/2022  Pt will be independent with Final HEP in order to improve strength and decrease back pain in order to improve pain-free  function at home and work.  Baseline: EVAL= Patient reports needs update and review of exercises to assist with her back pain Goal status: INITIAL  2.  Pt will decrease worst back pain as reported on NPRS by at least 3 points in order to demonstrate clinically significant reduction in back pain.  Baseline: EVAL= 8/10 Goal status: INITIAL  3.  Pt will decrease mODI score by  at least 13 points in order demonstrate clinically significant reduction in back pain/disability.   Baseline: EVAL=58% Goal status: INITIAL  4.  Pt will improve FOTO to target score of >55 to display perceived improvements in ability to complete ADL's.  Baseline: EVAL= 54; 11/14/22: 56 Goal status: Prgressing   5.  Patient (> 71 years old) will complete five times sit to stand test in <15 seconds indicating an increased LE strength and improved balance.  Baseline: EVAL = 20.33 Goal status: INITIAL  6.  Pt will increase by at least 0.13 m/s in order to demonstrate clinically significant improvement in community ambulation.   Baseline: EVAL = 0.97 m/s Goal status: INITIAL 7.  Patient will report ability to complete > or equal to 60 min grocery store outing or similar community activiities to return to her previous level of function.  Baseline: EVAL: patient reports difficulty tolerating outings in community. Goal status: INITIAL  8. Pt will improve BERG by at least 3 points in order to demonstrate clinically significant improvement in balance.    Baseline: 09/30/2022= 47/56  Goal status: INITIAL  9. Pt will increase by at least 12m (124ft) in order to demonstrate clinically significant improvement in cardiopulmonary endurance and community ambulation   Baseline: 09/23/2022= use of SPC RUE (pt preference): starting pain 4/10, then 5/10 at 323ft, and 6/10 at 536ft: audible RR dyspnea. Test terminated after 641ft, 3 minutes 15 seconds   Goal status: INITIAL PLAN:  PT FREQUENCY: 1-2x/week  PT DURATION: 12 weeks  PLANNED INTERVENTIONS: Therapeutic exercises, Therapeutic activity, Neuromuscular re-education, Balance training, Gait training, Patient/Family education, Self Care, Joint mobilization, Joint manipulation, Stair training, Vestibular training, Canalith repositioning, Orthotic/Fit training, DME instructions, Dry Needling, Electrical stimulation, Spinal manipulation, Spinal  mobilization, Cryotherapy, Moist heat, scar mobilization, Taping, and Manual therapy.  PLAN FOR NEXT SESSION: Continue to monitor vitals to add data for BP values. Scoliosis biased exercises and core strength, dynamic balance training.    11:35 AM, 11/14/22 Rosamaria Lints, PT, DPT Physical Therapist - [facility]  Outpatient Physical Therapy- Main [facility] [phone]      Rosamaria Lints, PT 11/14/2022, 11:35 AM   11:35 AM, 11/14/22 Physical Therapist - [facility]  Outpatient Physical Therapy- Main Campus [phone]    11:35 AM, 11/14/22

## 2022-11-17 ENCOUNTER — Ambulatory Visit: Payer: Medicare HMO

## 2022-11-17 DIAGNOSIS — R269 Unspecified abnormalities of gait and mobility: Secondary | ICD-10-CM | POA: Diagnosis not present

## 2022-11-17 DIAGNOSIS — R2681 Unsteadiness on feet: Secondary | ICD-10-CM

## 2022-11-17 DIAGNOSIS — M545 Low back pain, unspecified: Secondary | ICD-10-CM

## 2022-11-17 DIAGNOSIS — R2689 Other abnormalities of gait and mobility: Secondary | ICD-10-CM

## 2022-11-17 DIAGNOSIS — R262 Difficulty in walking, not elsewhere classified: Secondary | ICD-10-CM

## 2022-11-17 DIAGNOSIS — R278 Other lack of coordination: Secondary | ICD-10-CM

## 2022-11-17 DIAGNOSIS — M6281 Muscle weakness (generalized): Secondary | ICD-10-CM

## 2022-11-17 DIAGNOSIS — G8929 Other chronic pain: Secondary | ICD-10-CM

## 2022-11-17 NOTE — Therapy (Signed)
OUTPATIENT PHYSICAL THERAPY TREATMENT/Physical Therapy Progress Note   Dates of reporting period  09/11/2022   to   11/17/2022   Patient Name: Kendra Benton MRN: [id number] DOB:May 05, 1941, 83 y.o., female Today's Date: 11/18/2022  END OF SESSION:  PT End of Session - 11/17/22 1302     Visit Number 10   date corrected   Number of Visits 24    Date for PT Re-Evaluation 12/04/22    Authorization Type [employer] primary; [place] medicaid secondary    Authorization Time Period 09/11/22-12/04/22    Progress Note Due on Visit 10    PT Start Time 1300    PT Stop Time 1344    PT Time Calculation (min) 44 min    Equipment Utilized During Treatment Gait belt    Activity Tolerance Patient tolerated treatment well;No increased pain    Behavior During Therapy WFL for tasks assessed/performed               Past Medical History:  Diagnosis Date   Allergy    Anxiety    GERD (gastroesophageal reflux disease)    Headache    History of kidney stones    Hypertension    Myocardial infarction (HCC)    1997   Osteoporosis    Pneumonia    Past Surgical History:  Procedure Laterality Date   ABDOMINAL HYSTERECTOMY     patient has ovaries   APPENDECTOMY     ARTERY BIOPSY Right 12/30/2017   Procedure: BIOPSY TEMPORAL ARTERY;  Surgeon: Renford Dills, MD;  Location: [facility];  Service: Vascular;  Laterality: Right;   ARTERY BIOPSY Left 06/18/2018   Procedure: BIOPSY TEMPORAL ARTERY;  Surgeon: Renford Dills, MD;  Location: [facility];  Service: Vascular;  Laterality: Left;   BACK SURGERY     CARDIAC CATHETERIZATION     CATARACT EXTRACTION W/ INTRAOCULAR LENS  IMPLANT, BILATERAL Bilateral 2010   CHOLECYSTECTOMY N/A 02/28/2016   Procedure: LAPAROSCOPIC CHOLECYSTECTOMY WITH INTRAOPERATIVE CHOLANGIOGRAM;  Surgeon: Tiney Rouge III, MD;  Location: [facility];  Service: General;  Laterality: N/A;   LUMBAR FUSION  11/09/2016   L3-L5   SPINE SURGERY     Disc   TONSILLECTOMY     Patient Active  Problem List   Diagnosis Date Noted   Fall (on) (from) other stairs and steps, initial encounter 08/06/2020   Lumbar facet joint syndrome 06/18/2020   Spondylosis without myelopathy or radiculopathy, lumbosacral region 06/18/2020   DDD (degenerative disc disease), lumbosacral 06/18/2020   COVID 06/04/2020   Chronic pain syndrome 05/16/2020   Pharmacologic therapy 05/16/2020   Disorder of skeletal system 05/16/2020   Problems influencing health status 05/16/2020   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   Chronic hip pain (2ry area of Pain) (Bilateral) (R>L) 05/16/2020   Chronic lower extremity pain (Left) 05/16/2020   Numbness and tingling of both feet (intermittent/recurrent) 05/16/2020   Pain in rib (Bilateral) (R>L) (intermittent) 05/16/2020   Chronic knee pain (Left) 05/16/2020   Patellar pain (Left) (chronic, intermittent) 05/16/2020   Hard of hearing 05/16/2020   Carcinoma, lung, 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/25/2019   S/P partial lobectomy of lung 02/25/2019   Mass of upper lobe of right lung 01/06/2019   Chronic nonintractable headache 06/14/2018   Head ache 12/03/2017   Sinus bradycardia 10/06/2017   Chronic low back pain (1ry area of Pain) (Bilateral) (L>R) w/o sciatica 08/17/2017   Panic attack as reaction to stress 08/17/2017   Anxiety 06/15/2017   Atypical chest pain 06/15/2017  Hyperlipidemia 06/15/2017   Cholecystitis with cholelithiasis 02/27/2016   Increased frequency of urination 01/27/2015   History of night sweats 05/01/2014   Dupuytren's contracture 07/18/2013   Inverted nipple 04/18/2013   Depression 04/23/2010   Fibromyalgia 04/23/2010   Hypercholesterolemia 04/23/2010   Essential hypertension 04/23/2010   Hypothyroidism 04/23/2010   Irritable colon 04/23/2010   Osteoarthritis 04/23/2010    PCP: Cain Sieve, MD  REFERRING PROVIDER: Cain Sieve, MD  REFERRING DIAG: Chronic low back pain; At risk for falls  Rationale for Evaluation and  Treatment: Rehabilitation  THERAPY DIAG:  Abnormality of gait and mobility  Difficulty in walking, not elsewhere classified  Muscle weakness (generalized)  Other lack of coordination  Unsteadiness on feet  Other abnormalities of gait and mobility  Chronic bilateral low back pain without sciatica  Chronic left-sided low back pain without sciatica  ONSET DATE: chronic but worse since Jan 2024  SUBJECTIVE:                                                                                                                                                                                           SUBJECTIVE STATEMENT:  Pt reports hurting more since the weekend but states may be due to increased activity. Feeling some woozy after busy morning with yoga and errands.  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 She has received PT in the past for condition with positive results and currently using pain management strategies including pain meds, Heat, Exercises, Yoga, lidocain patch, diclofenac topical gel, and biofreeze. She was HTN controlled with medication.  She reports still lives alone but having trouble with community and social outings and having to modify how she performs her ADLs and housework due to her chronic pain. PMH- Spondylosis, Scoliosis, HTN, Lung CA.  PAIN:  Are you having pain? Yes 4/10 across low back, Right flank  PRECAUTIONS: Fall  WEIGHT BEARING RESTRICTIONS: No  FALLS:  Has patient fallen in last 6 months? No- However patient reports some balance issues and near falls- using cane with community outings  PLOF: Independent  PATIENT GOALS: To decrease my pain  NEXT MD VISIT: unknown   OBJECTIVE:   TODAY'S TREATMENT:  DATE: 11/18/22    BP=  117/59 mmHg (sitting) Left UE at rest BP=110/59 mmHg (standing) left UE after 2 min rest   Physical therapy treatment session today consisted of completing assessment of goals and administration of testing as demonstrated and documented in flow sheet, treatment, and goals section of this note. Addition treatments may be found below.   Self stretching:  LTR x 10 each way Single knee to chest x 5 each   Sidelye open book thoracic rotation x 10 each side Supine bridging with 2-3 sec hold x 10  Quadraped Thoracic rotation with reaching toward ceiling into reaching under body x 10 reps each Side Child's pose -hold 30 sec x 3  Physical therapy treatment session today consisted of completing assessment of goals and administration of testing as demonstrated and documented in flow sheet, treatment, and goals section of this note. Addition treatments may be found below.   Rechecked BP= 155/59 after goal testing    PATIENT EDUCATION:  Education details: PT plan of care; Exercise technique  Person educated: Patient Education method: Explanation, Demonstration, Tactile cues, and Verbal cues Education comprehension: verbalized understanding, returned demonstration, verbal cues required, and tactile cues required  HOME EXERCISE PROGRAM: 11/14/22:  Side step c red band around knees, back against wall   Access Code: [email or web address] URL: [email or web address] Date: 10/13/2022 Prepared by: Maureen Ralphs  Exercises - Standing Quadratus Lumborum Stretch with Doorway  - 1 x daily - 7 x weekly - 3 sets - 20-30 sec hold - Standing Quadratus Lumborum Mobilization with Small Ball on Wall  - 1 x daily - 7 x weekly - 3 sets - 10 reps - Supine Quadratus Lumborum Stretch  - 1 x daily - 7 x weekly - 3 sets - 20-30 hold - 90/90 SI Joint Self-Correction with Dowel  - 1 x daily - 7 x weekly - 3 sets - 10 reps - Hooklying Isometric Hip Abduction with Belt  - 1 x daily - 7 x weekly - 3 sets - 10 reps  - 5 hold    Access Code: [email or web address] URL: [email or web address] Date: 10/09/2022 Prepared by: Maureen Ralphs  Exercises - Standing Quadratus Lumborum Stretch with Doorway  - 1 x daily - 7 x weekly - 3 sets - 20-30 sec hold - Standing Quadratus Lumborum Mobilization with Small Ball on Wall  - 1 x daily - 7 x weekly - 3 sets - 10 reps - Supine Quadratus Lumborum Stretch  - 1 x daily - 7 x weekly - 3 sets - 20-30 hold  ASSESSMENT:  CLINICAL IMPRESSION:   Patient presents with good motivation for progress note visit #10. She endorses ongoing chronic low back pain but knowledgeable on pain management strategies including taking pain meds, rest or modify activities, proper stretching/ROM. She presents with progress made toward many of her original goals including slight improvement in gait speed, improved 6 min walk, improved Self perceived ability- as seen by improved MoD Oswestry and FOTO.  She is demonstrating improved LE strength as seen by improved 5x STS as well. Patient's condition has the potential to improve in response to therapy. Maximum improvement is yet to be obtained. The anticipated improvement is attainable and reasonable in a generally predictable time.  Patient will benefit from skilled PT services to improve her low back pain and functional strength and decrease her risk of falling while improving her quality of life.    OBJECTIVE IMPAIRMENTS: Abnormal gait, decreased activity tolerance, decreased balance, decreased endurance, decreased mobility, difficulty walking,  decreased strength, and pain.   ACTIVITY LIMITATIONS: carrying, lifting, bending, sitting, standing, squatting, sleeping, stairs, transfers, and bed mobility  PARTICIPATION LIMITATIONS: cleaning, laundry, shopping, community activity, and yard work  PERSONAL FACTORS: Age, Time since onset of injury/illness/exacerbation, and 3+ comorbidities: scoliosis, HTN, degenerative spine  are also affecting  patient's functional outcome.   REHAB POTENTIAL: Good  CLINICAL DECISION MAKING: Stable/uncomplicated  EVALUATION COMPLEXITY: Moderate   GOALS: Goals reviewed with patient? Yes  SHORT TERM GOALS: Target date: 10/24/2022  Pt will decrease worst back pain as reported on NPRS by at least 2 points in order to demonstrate clinically significant reduction in back pain.  Baseline: Eval= 8/10; 11/17/2022= 4/10 current and up to 7-8/10 at worst.  Goal status: ONGOING  LONG TERM GOALS: Target date: 12/04/2022  Pt will be independent with Final HEP in order to improve strength and decrease back pain in order to improve pain-free function at home and work.  Baseline: EVAL= Patient reports needs update and review of exercises to assist with her back pain; 11/17/2022- Patient performing mostly self stretching and yoga based exercises and will continue to benefit from adding to comprehensive HEP Goal status: ONGOING  2.  Pt will decrease worst back pain as reported on NPRS by at least 3 points in order to demonstrate clinically significant reduction in back pain.  Baseline: EVAL= 8/10; 11/17/2022= 4/10 current and up to 7-8/10 at worst.  Goal status: ONGOING  3.  Pt will decrease mODI score by at least 13 points in order demonstrate clinically significant reduction in back pain/disability.   Baseline: EVAL=58%; 11/17/2022= 40% Goal status: PROGRESSING  4.  Pt will improve FOTO to target score of >55 to display perceived improvements in ability to complete ADL's.  Baseline: EVAL= 54; 11/14/22: 56 Goal status: PROGRESSING  5.  Patient (> 17 years old) will complete five times sit to stand test in <15 seconds indicating an increased LE strength and improved balance.  Baseline: EVAL = 20.33; 11/17/2022= 16.55 sec without UE Support Goal status: PROGRESSING  6.  Pt will increase by at least 0.13 m/s in order to demonstrate clinically significant improvement in community ambulation.   Baseline: EVAL  = 0.97 m/s; 6/10=0.98 m/s  Goal status: PROGRESSING  7.  Patient will report ability to complete > or equal to 60 min grocery store outing or similar community activiities to return to her previous level of function.  Baseline: EVAL: patient reports difficulty tolerating outings in community. Goal status: INITIAL  8. Pt will improve BERG by at least 3 points in order to demonstrate clinically significant improvement in balance.    Baseline: 09/30/2022= 47/56  Goal status: INITIAL  9. Pt will increase by at least 67m (152ft) in order to demonstrate clinically significant improvement in cardiopulmonary endurance and community ambulation   Baseline: 09/23/2022= use of SPC RUE (pt preference): starting pain 4/10, then 5/10 at 335ft, and 6/10 at 524ft: audible RR dyspnea. Test terminated after 672ft, 3 minutes 15 seconds; 11/17/2022= 920 feet in 5 min 37 sec c/o 4/10 LBP and some DOE- O2 sat unable to read due to cold fingers- Respirations= 23/min  Goal status: PROGRESSING PLAN:  PT FREQUENCY: 1-2x/week  PT DURATION: 12 weeks  PLANNED INTERVENTIONS: Therapeutic exercises, Therapeutic activity, Neuromuscular re-education, Balance training, Gait training, Patient/Family education, Self Care, Joint mobilization, Joint manipulation, Stair training, Vestibular training, Canalith repositioning, Orthotic/Fit training, DME instructions, Dry Needling, Electrical stimulation, Spinal manipulation, Spinal mobilization, Cryotherapy, Moist heat, scar mobilization, Taping, and Manual therapy.  PLAN FOR NEXT SESSION: Continue to monitor vitals to add data for BP values. Scoliosis biased exercises and core strength, dynamic balance training.    8:27 AM, 11/18/22    Lenda Kelp, PT 11/18/2022, 8:27 AM   8:27 AM, 11/18/22 Physical Therapist - [facility]  Outpatient Physical Therapy- [facility] [phone]    8:27 AM, 11/18/22

## 2022-11-21 ENCOUNTER — Ambulatory Visit: Payer: Medicare HMO

## 2022-11-24 NOTE — Therapy (Signed)
OUTPATIENT PHYSICAL THERAPY TREATMENT  Patient Name: Kendra Benton MRN: [id number] DOB:1941-03-12, 82 y.o., female Today's Date: 11/25/2022  END OF SESSION:  PT End of Session - 11/25/22 0926     Visit Number 11    Number of Visits 24    Date for PT Re-Evaluation 12/04/22    Authorization Type [employer] primary; [place] medicaid secondary    Authorization Time Period 09/11/22-12/04/22    Progress Note Due on Visit 20    PT Start Time 0930    PT Stop Time 1014    PT Time Calculation (min) 44 min    Equipment Utilized During Treatment Gait belt    Activity Tolerance Patient tolerated treatment well;No increased pain    Behavior During Therapy WFL for tasks assessed/performed                Past Medical History:  Diagnosis Date   Allergy    Anxiety    GERD (gastroesophageal reflux disease)    Headache    History of kidney stones    Hypertension    Myocardial infarction (HCC)    1997   Osteoporosis    Pneumonia    Past Surgical History:  Procedure Laterality Date   ABDOMINAL HYSTERECTOMY     patient has ovaries   APPENDECTOMY     ARTERY BIOPSY Right 12/30/2017   Procedure: BIOPSY TEMPORAL ARTERY;  Surgeon: Renford Dills, MD;  Location: [facility];  Service: Vascular;  Laterality: Right;   ARTERY BIOPSY Left 06/18/2018   Procedure: BIOPSY TEMPORAL ARTERY;  Surgeon: Renford Dills, MD;  Location: [facility];  Service: Vascular;  Laterality: Left;   BACK SURGERY     CARDIAC CATHETERIZATION     CATARACT EXTRACTION W/ INTRAOCULAR LENS  IMPLANT, BILATERAL Bilateral 2010   CHOLECYSTECTOMY N/A 02/28/2016   Procedure: LAPAROSCOPIC CHOLECYSTECTOMY WITH INTRAOPERATIVE CHOLANGIOGRAM;  Surgeon: Tiney Rouge III, MD;  Location: [facility];  Service: General;  Laterality: N/A;   LUMBAR FUSION  11/09/2016   L3-L5   SPINE SURGERY     Disc   TONSILLECTOMY     Patient Active Problem List   Diagnosis Date Noted   Fall (on) (from) other stairs and steps, initial encounter  08/06/2020   Lumbar facet joint syndrome 06/18/2020   Spondylosis without myelopathy or radiculopathy, lumbosacral region 06/18/2020   DDD (degenerative disc disease), lumbosacral 06/18/2020   COVID 06/04/2020   Chronic pain syndrome 05/16/2020   Pharmacologic therapy 05/16/2020   Disorder of skeletal system 05/16/2020   Problems influencing health status 05/16/2020   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   Chronic hip pain (2ry area of Pain) (Bilateral) (R>L) 05/16/2020   Chronic lower extremity pain (Left) 05/16/2020   Numbness and tingling of both feet (intermittent/recurrent) 05/16/2020   Pain in rib (Bilateral) (R>L) (intermittent) 05/16/2020   Chronic knee pain (Left) 05/16/2020   Patellar pain (Left) (chronic, intermittent) 05/16/2020   Hard of hearing 05/16/2020   Carcinoma, lung, 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/25/2019   S/P partial lobectomy of lung 02/25/2019   Mass of upper lobe of right lung 01/06/2019   Chronic nonintractable headache 06/14/2018   Head ache 12/03/2017   Sinus bradycardia 10/06/2017   Chronic low back pain (1ry area of Pain) (Bilateral) (L>R) w/o sciatica 08/17/2017   Panic attack as reaction to stress 08/17/2017   Anxiety 06/15/2017   Atypical chest pain 06/15/2017   Hyperlipidemia 06/15/2017   Cholecystitis with cholelithiasis 02/27/2016   Increased frequency of urination 01/27/2015   History of night  sweats 05/01/2014   Dupuytren's contracture 07/18/2013   Inverted nipple 04/18/2013   Depression 04/23/2010   Fibromyalgia 04/23/2010   Hypercholesterolemia 04/23/2010   Essential hypertension 04/23/2010   Hypothyroidism 04/23/2010   Irritable colon 04/23/2010   Osteoarthritis 04/23/2010    PCP: Cain Sieve, MD  REFERRING PROVIDER: Cain Sieve, MD  REFERRING DIAG: Chronic low back pain; At risk for falls  Rationale for Evaluation and Treatment: Rehabilitation  THERAPY DIAG:  Abnormality of gait and mobility  Difficulty in  walking, not elsewhere classified  Muscle weakness (generalized)  Other lack of coordination  Unsteadiness on feet  Other abnormalities of gait and mobility  Chronic bilateral low back pain without sciatica  Chronic left-sided low back pain without sciatica  ONSET DATE: chronic but worse since Jan 2024  SUBJECTIVE:                                                                                                                                                                                           SUBJECTIVE STATEMENT:  Pt reports trying to come off pain meds some and states was able to complete yoga yesterday without significant difficulty or any increased pain.    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 She has received PT in the past for condition with positive results and currently using pain management strategies including pain meds, Heat, Exercises, Yoga, lidocain patch, diclofenac topical gel, and biofreeze. She was HTN controlled with medication.  She reports still lives alone but having trouble with community and social outings and having to modify how she performs her ADLs and housework due to her chronic pain. PMH- Spondylosis, Scoliosis, HTN, Lung CA.  PAIN:  Are you having pain? Yes 4/10 across low back, Right flank  PRECAUTIONS: Fall  WEIGHT BEARING RESTRICTIONS: No  FALLS:  Has patient fallen in last 6 months? No- However patient reports some balance issues and near falls- using cane with community outings  PLOF: Independent  PATIENT GOALS: To decrease my pain  NEXT MD VISIT: unknown   OBJECTIVE:   TODAY'S TREATMENT:  DATE: 11/25/22       Self stretching:  LTR x 10 each way Single knee to chest x 5 each  Supine bridging with 2-3  sec hold x 10  Sidelye Clamshell 2-3 sec hold x 10  Sidelye plank raise 5 reps x 2 sets each Side Standing QL with 5 #DB 2 sets of 10 reps Standing- bending over mat table with 2 pillows into lumbar extension x 12 reps .    PATIENT EDUCATION:  Education details: PT plan of care; Exercise technique  Person educated: Patient Education method: Explanation, Demonstration, Tactile cues, and Verbal cues Education comprehension: verbalized understanding, returned demonstration, verbal cues required, and tactile cues required  HOME EXERCISE PROGRAM: 11/14/22:  Side step c red band around knees, back against wall   Access Code: [email or web address] URL: [email or web address] Date: 10/13/2022 Prepared by: Maureen Ralphs  Exercises - Standing Quadratus Lumborum Stretch with Doorway  - 1 x daily - 7 x weekly - 3 sets - 20-30 sec hold - Standing Quadratus Lumborum Mobilization with Small Ball on Wall  - 1 x daily - 7 x weekly - 3 sets - 10 reps - Supine Quadratus Lumborum Stretch  - 1 x daily - 7 x weekly - 3 sets - 20-30 hold - 90/90 SI Joint Self-Correction with Dowel  - 1 x daily - 7 x weekly - 3 sets - 10 reps - Hooklying Isometric Hip Abduction with Belt  - 1 x daily - 7 x weekly - 3 sets - 10 reps - 5 hold    Access Code: [email or web address] URL: [email or web address] Date: 10/09/2022 Prepared by: Maureen Ralphs  Exercises - Standing Quadratus Lumborum Stretch with Doorway  - 1 x daily - 7 x weekly - 3 sets - 20-30 sec hold - Standing Quadratus Lumborum Mobilization with Small Ball on Wall  - 1 x daily - 7 x weekly - 3 sets - 10 reps - Supine Quadratus Lumborum Stretch  - 1 x daily - 7 x weekly - 3 sets - 20-30 hold  ASSESSMENT:  CLINICAL IMPRESSION:   Patient performed well with addition of lumbar strengthening without increasing low back symptoms. She was responsive to all VC to maintain safe technique and mindful to modify as needed to avoid any pain. Patient will  benefit from skilled PT services to improve her low back pain and functional strength and decrease her risk of falling while improving her quality of life.    OBJECTIVE IMPAIRMENTS: Abnormal gait, decreased activity tolerance, decreased balance, decreased endurance, decreased mobility, difficulty walking, decreased strength, and pain.   ACTIVITY LIMITATIONS: carrying, lifting, bending, sitting, standing, squatting, sleeping, stairs, transfers, and bed mobility  PARTICIPATION LIMITATIONS: cleaning, laundry, shopping, community activity, and yard work  PERSONAL FACTORS: Age, Time since onset of injury/illness/exacerbation, and 3+ comorbidities: scoliosis, HTN, degenerative spine  are also affecting patient's functional outcome.   REHAB POTENTIAL: Good  CLINICAL DECISION MAKING: Stable/uncomplicated  EVALUATION COMPLEXITY: Moderate   GOALS: Goals reviewed with patient? Yes  SHORT TERM GOALS: Target date: 10/24/2022  Pt will decrease worst back pain as reported on NPRS by at least 2 points in order to demonstrate clinically significant reduction in back pain.  Baseline: Eval= 8/10; 11/17/2022= 4/10 current and up to 7-8/10 at worst.  Goal status: ONGOING  LONG TERM GOALS: Target date: 12/04/2022  Pt will be independent with Final HEP in order to improve strength and decrease back pain in order to improve pain-free function at home and work.  Baseline: EVAL= Patient reports needs  update and review of exercises to assist with her back pain; 11/17/2022- Patient performing mostly self stretching and yoga based exercises and will continue to benefit from adding to comprehensive HEP Goal status: ONGOING  2.  Pt will decrease worst back pain as reported on NPRS by at least 3 points in order to demonstrate clinically significant reduction in back pain.  Baseline: EVAL= 8/10; 11/17/2022= 4/10 current and up to 7-8/10 at worst.  Goal status: ONGOING  3.  Pt will decrease mODI score by at least 13  points in order demonstrate clinically significant reduction in back pain/disability.   Baseline: EVAL=58%; 11/17/2022= 40% Goal status: PROGRESSING  4.  Pt will improve FOTO to target score of >55 to display perceived improvements in ability to complete ADL's.  Baseline: EVAL= 54; 11/14/22: 56 Goal status: PROGRESSING  5.  Patient (> 10 years old) will complete five times sit to stand test in <15 seconds indicating an increased LE strength and improved balance.  Baseline: EVAL = 20.33; 11/17/2022= 16.55 sec without UE Support Goal status: PROGRESSING  6.  Pt will increase by at least 0.13 m/s in order to demonstrate clinically significant improvement in community ambulation.   Baseline: EVAL = 0.97 m/s; 6/10=0.98 m/s  Goal status: PROGRESSING  7.  Patient will report ability to complete > or equal to 60 min grocery store outing or similar community activiities to return to her previous level of function.  Baseline: EVAL: patient reports difficulty tolerating outings in community. Goal status: INITIAL  8. Pt will improve BERG by at least 3 points in order to demonstrate clinically significant improvement in balance.    Baseline: 09/30/2022= 47/56  Goal status: INITIAL  9. Pt will increase by at least 31m (151ft) in order to demonstrate clinically significant improvement in cardiopulmonary endurance and community ambulation   Baseline: 09/23/2022= use of SPC RUE (pt preference): starting pain 4/10, then 5/10 at 380ft, and 6/10 at 533ft: audible RR dyspnea. Test terminated after 694ft, 3 minutes 15 seconds; 11/17/2022= 920 feet in 5 min 37 sec c/o 4/10 LBP and some DOE- O2 sat unable to read due to cold fingers- Respirations= 23/min  Goal status: PROGRESSING PLAN:  PT FREQUENCY: 1-2x/week  PT DURATION: 12 weeks  PLANNED INTERVENTIONS: Therapeutic exercises, Therapeutic activity, Neuromuscular re-education, Balance training, Gait training, Patient/Family education, Self Care, Joint  mobilization, Joint manipulation, Stair training, Vestibular training, Canalith repositioning, Orthotic/Fit training, DME instructions, Dry Needling, Electrical stimulation, Spinal manipulation, Spinal mobilization, Cryotherapy, Moist heat, scar mobilization, Taping, and Manual therapy.  PLAN FOR NEXT SESSION: Continue to monitor vitals to add data for BP values. Scoliosis biased exercises and core strength, dynamic balance training.    12:30 PM, 11/25/22    Lenda Kelp, PT 11/25/2022, 12:30 PM   12:30 PM, 11/25/22 Physical Therapist - [facility]  Outpatient Physical Therapy- Main [facility] [phone]    12:30 PM, 11/25/22

## 2022-11-25 ENCOUNTER — Ambulatory Visit: Payer: Medicare HMO

## 2022-11-25 DIAGNOSIS — R278 Other lack of coordination: Secondary | ICD-10-CM

## 2022-11-25 DIAGNOSIS — R269 Unspecified abnormalities of gait and mobility: Secondary | ICD-10-CM | POA: Diagnosis not present

## 2022-11-25 DIAGNOSIS — R262 Difficulty in walking, not elsewhere classified: Secondary | ICD-10-CM

## 2022-11-25 DIAGNOSIS — R2689 Other abnormalities of gait and mobility: Secondary | ICD-10-CM

## 2022-11-25 DIAGNOSIS — M6281 Muscle weakness (generalized): Secondary | ICD-10-CM

## 2022-11-25 DIAGNOSIS — R2681 Unsteadiness on feet: Secondary | ICD-10-CM

## 2022-11-25 DIAGNOSIS — M545 Low back pain, unspecified: Secondary | ICD-10-CM

## 2022-11-27 ENCOUNTER — Ambulatory Visit: Payer: Medicare HMO

## 2022-11-27 DIAGNOSIS — R269 Unspecified abnormalities of gait and mobility: Secondary | ICD-10-CM | POA: Diagnosis not present

## 2022-11-27 DIAGNOSIS — R2681 Unsteadiness on feet: Secondary | ICD-10-CM

## 2022-11-27 DIAGNOSIS — R262 Difficulty in walking, not elsewhere classified: Secondary | ICD-10-CM

## 2022-11-27 DIAGNOSIS — R278 Other lack of coordination: Secondary | ICD-10-CM

## 2022-11-27 DIAGNOSIS — M6281 Muscle weakness (generalized): Secondary | ICD-10-CM

## 2022-11-27 NOTE — Therapy (Signed)
OUTPATIENT PHYSICAL THERAPY TREATMENT  Patient Name: Kendra Benton MRN: [id number] DOB:[birth year], 82 y.o., female Today's Date: 11/27/2022  END OF SESSION:  PT End of Session - 11/27/22 1022     Visit Number 12    Number of Visits 24    Date for PT Re-Evaluation 12/04/22    Authorization Type [employer] primary;  medicaid secondary    Authorization Time Period 09/11/22-12/04/22    Progress Note Due on Visit 20    PT Start Time 1017    PT Stop Time 1059    PT Time Calculation (min) 42 min    Equipment Utilized During Treatment Gait belt    Activity Tolerance Patient tolerated treatment well;No increased pain    Behavior During Therapy WFL for tasks assessed/performed                Past Medical History:  Diagnosis Date   Allergy    Anxiety    GERD (gastroesophageal reflux disease)    Headache    History of kidney stones    Hypertension    Myocardial infarction (HCC)    1997   Osteoporosis    Pneumonia    Past Surgical History:  Procedure Laterality Date   ABDOMINAL HYSTERECTOMY     patient has ovaries   APPENDECTOMY     ARTERY BIOPSY Right 12/30/2017   Procedure: BIOPSY TEMPORAL ARTERY;  Surgeon: Renford Dills, MD;  Location: [facility];  Service: Vascular;  Laterality: Right;   ARTERY BIOPSY Left 06/18/2018   Procedure: BIOPSY TEMPORAL ARTERY;  Surgeon: Renford Dills, MD;  Location: [facility];  Service: Vascular;  Laterality: Left;   BACK SURGERY     CARDIAC CATHETERIZATION     CATARACT EXTRACTION W/ INTRAOCULAR LENS  IMPLANT, BILATERAL Bilateral 2010   CHOLECYSTECTOMY N/A 02/28/2016   Procedure: LAPAROSCOPIC CHOLECYSTECTOMY WITH INTRAOPERATIVE CHOLANGIOGRAM;  Surgeon: Tiney Rouge III, MD;  Location: [facility];  Service: General;  Laterality: N/A;   LUMBAR FUSION  11/09/2016   L3-L5   SPINE SURGERY     Disc   TONSILLECTOMY     Patient Active Problem List   Diagnosis Date Noted   Fall (on) (from) other stairs and steps, initial encounter  08/06/2020   Lumbar facet joint syndrome 06/18/2020   Spondylosis without myelopathy or radiculopathy, lumbosacral region 06/18/2020   DDD (degenerative disc disease), lumbosacral 06/18/2020   COVID 06/04/2020   Chronic pain syndrome 05/16/2020   Pharmacologic therapy 05/16/2020   Disorder of skeletal system 05/16/2020   Problems influencing health status 05/16/2020   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   Chronic hip pain (2ry area of Pain) (Bilateral) (R>L) 05/16/2020   Chronic lower extremity pain (Left) 05/16/2020   Numbness and tingling of both feet (intermittent/recurrent) 05/16/2020   Pain in rib (Bilateral) (R>L) (intermittent) 05/16/2020   Chronic knee pain (Left) 05/16/2020   Patellar pain (Left) (chronic, intermittent) 05/16/2020   Hard of hearing 05/16/2020   Carcinoma, lung, 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/25/2019   S/P partial lobectomy of lung 02/25/2019   Mass of upper lobe of right lung 01/06/2019   Chronic nonintractable headache 06/14/2018   Head ache 12/03/2017   Sinus bradycardia 10/06/2017   Chronic low back pain (1ry area of Pain) (Bilateral) (L>R) w/o sciatica 08/17/2017   Panic attack as reaction to stress 08/17/2017   Anxiety 06/15/2017   Atypical chest pain 06/15/2017   Hyperlipidemia 06/15/2017   Cholecystitis with cholelithiasis 02/27/2016   Increased frequency of urination 01/27/2015   History of night  sweats 05/01/2014   Dupuytren's contracture 07/18/2013   Inverted nipple 04/18/2013   Depression 04/23/2010   Fibromyalgia 04/23/2010   Hypercholesterolemia 04/23/2010   Essential hypertension 04/23/2010   Hypothyroidism 04/23/2010   Irritable colon 04/23/2010   Osteoarthritis 04/23/2010    PCP: Cain Sieve, MD  REFERRING PROVIDER: Cain Sieve, MD  REFERRING DIAG: Chronic low back pain; At risk for falls  Rationale for Evaluation and Treatment: Rehabilitation  THERAPY DIAG:  Abnormality of gait and mobility  Difficulty in  walking, not elsewhere classified  Muscle weakness (generalized)  Other lack of coordination  Unsteadiness on feet  ONSET DATE: chronic but worse since Jan 2024  SUBJECTIVE:                                                                                                                                                                                           SUBJECTIVE STATEMENT:  Pt reports she has had some good pain relief since last visit- states she has been able to walk more and states - "I think we cracked the code last time."   PERTINENT HISTORY:  Patient is a 82 year old female with diagnosis of chronic low back pain with scoliosis and degenerative lumbar spine. She has received PT in the past for condition with positive results and currently using pain management strategies including pain meds, Heat, Exercises, Yoga, lidocain patch, diclofenac topical gel, and biofreeze. She was HTN controlled with medication.  She reports still lives alone but having trouble with community and social outings and having to modify how she performs her ADLs and housework due to her chronic pain. PMH- Spondylosis, Scoliosis, HTN, Lung CA.  PAIN:  Are you having pain? Yes 3/10 across low back, Right flank  PRECAUTIONS: Fall  WEIGHT BEARING RESTRICTIONS: No  FALLS:  Has patient fallen in last 6 months? No- However patient reports some balance issues and near falls- using cane with community outings  PLOF: Independent  PATIENT GOALS: To decrease my pain  NEXT MD VISIT: unknown   OBJECTIVE:   TODAY'S TREATMENT:  DATE: 11/27/22        Self stretching:  LTR x 10 each way Single knee to chest x 10 each Piriformis- hold 10 sec x 3  Hip circles- 10 each direction (cw and ccw)  Hip IR/ER- x 20 reps each   Supine bridging with Hip abd in hooklye using RTB x 15 reps    Sidelye Clamshell RTB x 10 each LE Sidelye plank raise + clam with RTB 5 reps x 2 sets each Side Standing QL with 5 #DB 2 sets of 10 reps Standing- bending over mat table with 2 pillows into lumbar extension x 12 reps .    PATIENT EDUCATION:  Education details: PT plan of care; Exercise technique  Person educated: Patient Education method: Explanation, Demonstration, Tactile cues, and Verbal cues Education comprehension: verbalized understanding, returned demonstration, verbal cues required, and tactile cues required  HOME EXERCISE PROGRAM: 11/14/22:  Side step c red band around knees, back against wall   Access Code: [email or web address] URL: [email or web address] Date: 10/13/2022 Prepared by: Maureen Ralphs  Exercises - Standing Quadratus Lumborum Stretch with Doorway  - 1 x daily - 7 x weekly - 3 sets - 20-30 sec hold - Standing Quadratus Lumborum Mobilization with Small Ball on Wall  - 1 x daily - 7 x weekly - 3 sets - 10 reps - Supine Quadratus Lumborum Stretch  - 1 x daily - 7 x weekly - 3 sets - 20-30 hold - 90/90 SI Joint Self-Correction with Dowel  - 1 x daily - 7 x weekly - 3 sets - 10 reps - Hooklying Isometric Hip Abduction with Belt  - 1 x daily - 7 x weekly - 3 sets - 10 reps - 5 hold    Access Code: [email or web address] URL: [email or web address] Date: 10/09/2022 Prepared by: Maureen Ralphs  Exercises - Standing Quadratus Lumborum Stretch with Doorway  - 1 x daily - 7 x weekly - 3 sets - 20-30 sec hold - Standing Quadratus Lumborum Mobilization with Small Ball on Wall  - 1 x daily - 7 x weekly - 3 sets - 10 reps - Supine Quadratus Lumborum Stretch  - 1 x daily - 7 x weekly - 3 sets - 20-30 hold  ASSESSMENT:  CLINICAL IMPRESSION:   Patient continued to perform well overall- continues to add some lumbar strengthening in within pain limits. She was able to review and progress therex without report of any increased pain. She continues to be responsive  to all VC and tactile cues for lumbar therex and able to follow demonstration well overall today. Patient will benefit from skilled PT services to improve her low back pain and functional strength and decrease her risk of falling while improving her quality of life.    OBJECTIVE IMPAIRMENTS: Abnormal gait, decreased activity tolerance, decreased balance, decreased endurance, decreased mobility, difficulty walking, decreased strength, and pain.   ACTIVITY LIMITATIONS: carrying, lifting, bending, sitting, standing, squatting, sleeping, stairs, transfers, and bed mobility  PARTICIPATION LIMITATIONS: cleaning, laundry, shopping, community activity, and yard work  PERSONAL FACTORS: Age, Time since onset of injury/illness/exacerbation, and 3+ comorbidities: scoliosis, HTN, degenerative spine  are also affecting patient's functional outcome.   REHAB POTENTIAL: Good  CLINICAL DECISION MAKING: Stable/uncomplicated  EVALUATION COMPLEXITY: Moderate   GOALS: Goals reviewed with patient? Yes  SHORT TERM GOALS: Target date: 10/24/2022  Pt will decrease worst back pain as reported on NPRS by at least 2 points in order to demonstrate clinically significant reduction in back pain.  Baseline: Eval= 8/10; 11/17/2022= 4/10  current and up to 7-8/10 at worst.  Goal status: ONGOING  LONG TERM GOALS: Target date: 12/04/2022  Pt will be independent with Final HEP in order to improve strength and decrease back pain in order to improve pain-free function at home and work.  Baseline: EVAL= Patient reports needs update and review of exercises to assist with her back pain; 11/17/2022- Patient performing mostly self stretching and yoga based exercises and will continue to benefit from adding to comprehensive HEP Goal status: ONGOING  2.  Pt will decrease worst back pain as reported on NPRS by at least 3 points in order to demonstrate clinically significant reduction in back pain.  Baseline: EVAL= 8/10; 11/17/2022=  4/10 current and up to 7-8/10 at worst.  Goal status: ONGOING  3.  Pt will decrease mODI score by at least 13 points in order demonstrate clinically significant reduction in back pain/disability.   Baseline: EVAL=58%; 11/17/2022= 40% Goal status: PROGRESSING  4.  Pt will improve FOTO to target score of >55 to display perceived improvements in ability to complete ADL's.  Baseline: EVAL= 54; 11/14/22: 56 Goal status: PROGRESSING  5.  Patient (> 24 years old) will complete five times sit to stand test in <15 seconds indicating an increased LE strength and improved balance.  Baseline: EVAL = 20.33; 11/17/2022= 16.55 sec without UE Support Goal status: PROGRESSING  6.  Pt will increase by at least 0.13 m/s in order to demonstrate clinically significant improvement in community ambulation.   Baseline: EVAL = 0.97 m/s; 6/10=0.98 m/s  Goal status: PROGRESSING  7.  Patient will report ability to complete > or equal to 60 min grocery store outing or similar community activiities to return to her previous level of function.  Baseline: EVAL: patient reports difficulty tolerating outings in community. Goal status: INITIAL  8. Pt will improve BERG by at least 3 points in order to demonstrate clinically significant improvement in balance.    Baseline: 09/30/2022= 47/56  Goal status: INITIAL  9. Pt will increase by at least 39m (172ft) in order to demonstrate clinically significant improvement in cardiopulmonary endurance and community ambulation   Baseline: 09/23/2022= use of SPC RUE (pt preference): starting pain 4/10, then 5/10 at 350ft, and 6/10 at 572ft: audible RR dyspnea. Test terminated after 689ft, 3 minutes 15 seconds; 11/17/2022= 920 feet in 5 min 37 sec c/o 4/10 LBP and some DOE- O2 sat unable to read due to cold fingers- Respirations= 23/min  Goal status: PROGRESSING PLAN:  PT FREQUENCY: 1-2x/week  PT DURATION: 12 weeks  PLANNED INTERVENTIONS: Therapeutic exercises, Therapeutic  activity, Neuromuscular re-education, Balance training, Gait training, Patient/Family education, Self Care, Joint mobilization, Joint manipulation, Stair training, Vestibular training, Canalith repositioning, Orthotic/Fit training, DME instructions, Dry Needling, Electrical stimulation, Spinal manipulation, Spinal mobilization, Cryotherapy, Moist heat, scar mobilization, Taping, and Manual therapy.  PLAN FOR NEXT SESSION: Continue to monitor vitals to add data for BP values. Scoliosis biased exercises and core strength, dynamic balance training.    3:49 PM, 11/27/22    Lenda Kelp, PT 11/27/2022, 3:49 PM   3:49 PM, 11/27/22 Physical Therapist - [facility]  Out[facility] [phone]    3:49 PM, 11/27/22

## 2022-12-01 ENCOUNTER — Ambulatory Visit: Payer: Medicare HMO

## 2022-12-01 DIAGNOSIS — R269 Unspecified abnormalities of gait and mobility: Secondary | ICD-10-CM

## 2022-12-01 DIAGNOSIS — R262 Difficulty in walking, not elsewhere classified: Secondary | ICD-10-CM

## 2022-12-01 DIAGNOSIS — R2681 Unsteadiness on feet: Secondary | ICD-10-CM

## 2022-12-01 DIAGNOSIS — M6281 Muscle weakness (generalized): Secondary | ICD-10-CM

## 2022-12-01 DIAGNOSIS — R278 Other lack of coordination: Secondary | ICD-10-CM

## 2022-12-01 DIAGNOSIS — G8929 Other chronic pain: Secondary | ICD-10-CM

## 2022-12-01 DIAGNOSIS — R2689 Other abnormalities of gait and mobility: Secondary | ICD-10-CM

## 2022-12-01 DIAGNOSIS — M545 Low back pain, unspecified: Secondary | ICD-10-CM

## 2022-12-01 NOTE — Therapy (Signed)
OUTPATIENT PHYSICAL THERAPY TREATMENT  Patient Name: Kendra Benton MRN: [id number] DOB:April 12, 1941, 82 y.o., female Today's Date: 12/01/2022  END OF SESSION:  PT End of Session - 12/01/22 1354     Visit Number 13    Number of Visits 24    Date for PT Re-Evaluation 12/04/22    Authorization Type [employer] primary; [place] medicaid secondary    Authorization Time Period 09/11/22-12/04/22    Progress Note Due on Visit 20    PT Start Time 1347    PT Stop Time 1428    PT Time Calculation (min) 41 min    Equipment Utilized During Treatment Gait belt    Activity Tolerance Patient tolerated treatment well;No increased pain    Behavior During Therapy WFL for tasks assessed/performed                 Past Medical History:  Diagnosis Date   Allergy    Anxiety    GERD (gastroesophageal reflux disease)    Headache    History of kidney stones    Hypertension    Myocardial infarction (HCC)    1997   Osteoporosis    Pneumonia    Past Surgical History:  Procedure Laterality Date   ABDOMINAL HYSTERECTOMY     patient has ovaries   APPENDECTOMY     ARTERY BIOPSY Right 12/30/2017   Procedure: BIOPSY TEMPORAL ARTERY;  Surgeon: Renford Dills, MD;  Location: [facility];  Service: Vascular;  Laterality: Right;   ARTERY BIOPSY Left 06/18/2018   Procedure: BIOPSY TEMPORAL ARTERY;  Surgeon: Renford Dills, MD;  Location: [facility];  Service: Vascular;  Laterality: Left;   BACK SURGERY     CARDIAC CATHETERIZATION     CATARACT EXTRACTION W/ INTRAOCULAR LENS  IMPLANT, BILATERAL Bilateral 2010   CHOLECYSTECTOMY N/A 02/28/2016   Procedure: LAPAROSCOPIC CHOLECYSTECTOMY WITH INTRAOPERATIVE CHOLANGIOGRAM;  Surgeon: Tiney Rouge III, MD;  Location: [facility];  Service: General;  Laterality: N/A;   LUMBAR FUSION  11/09/2016   L3-L5   SPINE SURGERY     Disc   TONSILLECTOMY     Patient Active Problem List   Diagnosis Date Noted   Fall (on) (from) other stairs and steps, initial encounter  08/06/2020   Lumbar facet joint syndrome 06/18/2020   Spondylosis without myelopathy or radiculopathy, lumbosacral region 06/18/2020   DDD (degenerative disc disease), lumbosacral 06/18/2020   COVID 06/04/2020   Chronic pain syndrome 05/16/2020   Pharmacologic therapy 05/16/2020   Disorder of skeletal system 05/16/2020   Problems influencing health status 05/16/2020   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   Chronic hip pain (2ry area of Pain) (Bilateral) (R>L) 05/16/2020   Chronic lower extremity pain (Left) 05/16/2020   Numbness and tingling of both feet (intermittent/recurrent) 05/16/2020   Pain in rib (Bilateral) (R>L) (intermittent) 05/16/2020   Chronic knee pain (Left) 05/16/2020   Patellar pain (Left) (chronic, intermittent) 05/16/2020   Hard of hearing 05/16/2020   Carcinoma, lung, 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/25/2019   S/P partial lobectomy of lung 02/25/2019   Mass of upper lobe of right lung 01/06/2019   Chronic nonintractable headache 06/14/2018   Head ache 12/03/2017   Sinus bradycardia 10/06/2017   Chronic low back pain (1ry area of Pain) (Bilateral) (L>R) w/o sciatica 08/17/2017   Panic attack as reaction to stress 08/17/2017   Anxiety 06/15/2017   Atypical chest pain 06/15/2017   Hyperlipidemia 06/15/2017   Cholecystitis with cholelithiasis 02/27/2016   Increased frequency of urination 01/27/2015   History of  night sweats 05/01/2014   Dupuytren's contracture 07/18/2013   Inverted nipple 04/18/2013   Depression 04/23/2010   Fibromyalgia 04/23/2010   Hypercholesterolemia 04/23/2010   Essential hypertension 04/23/2010   Hypothyroidism 04/23/2010   Irritable colon 04/23/2010   Osteoarthritis 04/23/2010    PCP: Cain Sieve, MD  REFERRING PROVIDER: Cain Sieve, MD  REFERRING DIAG: Chronic low back pain; At risk for falls  Rationale for Evaluation and Treatment: Rehabilitation  THERAPY DIAG:  Abnormality of gait and mobility  Difficulty in  walking, not elsewhere classified  Muscle weakness (generalized)  Other lack of coordination  Unsteadiness on feet  Other abnormalities of gait and mobility  Chronic bilateral low back pain without sciatica  Chronic left-sided low back pain without sciatica  ONSET DATE: chronic but worse since Jan 2024  SUBJECTIVE:                                                                                                                                                                                           SUBJECTIVE STATEMENT:  Patient reports didn't do much over the weekend due to the heat and states feeling pretty loose today- reports 3/10 pain.    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 She has received PT in the past for condition with positive results and currently using pain management strategies including pain meds, Heat, Exercises, Yoga, lidocain patch, diclofenac topical gel, and biofreeze. She was HTN controlled with medication.  She reports still lives alone but having trouble with community and social outings and having to modify how she performs her ADLs and housework due to her chronic pain. PMH- Spondylosis, Scoliosis, HTN, Lung CA.  PAIN:  Are you having pain? Yes 3/10 across low back, Right flank  PRECAUTIONS: Fall  WEIGHT BEARING RESTRICTIONS: No  FALLS:  Has patient fallen in last 6 months? No- However patient reports some balance issues and near falls- using cane with community outings  PLOF: Independent  PATIENT GOALS: To decrease my pain  NEXT MD VISIT: unknown   OBJECTIVE:   TODAY'S TREATMENT:  DATE: 12/01/22        THEREX:  Supine bridging with green theraball x 15 Supine knee to chest with LE on greentheraball x 15 reps Supine  LE gripping green theraball - lift off mat with TrA contract x 10 Supine bridging with Hip abd in hooklye using RTB x 15 reps   Sidelye Clamshell RTB x 10 each LE Sidelye plank raise + clam with RTB 5 reps x 2 sets each Side Standing QL with 5 #DB 2 sets of 10 reps Wall squat hold 10 sec x 5 sets  Standing wall lumbar ext x 10 reps     PATIENT EDUCATION:  Education details: PT plan of care; Exercise technique  Person educated: Patient Education method: Explanation, Demonstration, Tactile cues, and Verbal cues Education comprehension: verbalized understanding, returned demonstration, verbal cues required, and tactile cues required  HOME EXERCISE PROGRAM: 11/14/22:  Side step c red band around knees, back against wall   Access Code: [email or web address] URL: [email or web address] Date: 10/13/2022 Prepared by: Maureen Ralphs  Exercises - Standing Quadratus Lumborum Stretch with Doorway  - 1 x daily - 7 x weekly - 3 sets - 20-30 sec hold - Standing Quadratus Lumborum Mobilization with Small Ball on Wall  - 1 x daily - 7 x weekly - 3 sets - 10 reps - Supine Quadratus Lumborum Stretch  - 1 x daily - 7 x weekly - 3 sets - 20-30 hold - 90/90 SI Joint Self-Correction with Dowel  - 1 x daily - 7 x weekly - 3 sets - 10 reps - Hooklying Isometric Hip Abduction with Belt  - 1 x daily - 7 x weekly - 3 sets - 10 reps - 5 hold    Access Code: [email or web address] URL: [email or web address] Date: 10/09/2022 Prepared by: Maureen Ralphs  Exercises - Standing Quadratus Lumborum Stretch with Doorway  - 1 x daily - 7 x weekly - 3 sets - 20-30 sec hold - Standing Quadratus Lumborum Mobilization with Small Ball on Wall  - 1 x daily - 7 x weekly - 3 sets - 10 reps - Supine Quadratus Lumborum Stretch  - 1 x daily - 7 x weekly - 3 sets - 20-30 hold  ASSESSMENT:  CLINICAL IMPRESSION: Patient presents with good motivation. She preceded PT visit with yoga session so treatment focused more on  core/low back/LE strengthening vs. Flexibility. She was challenged with advanced core activities and demo some fatigue with activities today but continues to demo steady progress with focus more on lumbar strengtheing in pain- free positions. Patient will benefit from skilled PT services to improve her low back pain and functional strength and decrease her risk of falling while improving her quality of life.    OBJECTIVE IMPAIRMENTS: Abnormal gait, decreased activity tolerance, decreased balance, decreased endurance, decreased mobility, difficulty walking, decreased strength, and pain.   ACTIVITY LIMITATIONS: carrying, lifting, bending, sitting, standing, squatting, sleeping, stairs, transfers, and bed mobility  PARTICIPATION LIMITATIONS: cleaning, laundry, shopping, community activity, and yard work  PERSONAL FACTORS: Age, Time since onset of injury/illness/exacerbation, and 3+ comorbidities: scoliosis, HTN, degenerative spine  are also affecting patient's functional outcome.   REHAB POTENTIAL: Good  CLINICAL DECISION MAKING: Stable/uncomplicated  EVALUATION COMPLEXITY: Moderate   GOALS: Goals reviewed with patient? Yes  SHORT TERM GOALS: Target date: 10/24/2022  Pt will decrease worst back pain as reported on NPRS by at least 2 points in order to demonstrate clinically significant reduction in back pain.  Baseline: Eval= 8/10; 11/17/2022= 4/10 current and up to 7-8/10  at worst.  Goal status: ONGOING  LONG TERM GOALS: Target date: 12/04/2022  Pt will be independent with Final HEP in order to improve strength and decrease back pain in order to improve pain-free function at home and work.  Baseline: EVAL= Patient reports needs update and review of exercises to assist with her back pain; 11/17/2022- Patient performing mostly self stretching and yoga based exercises and will continue to benefit from adding to comprehensive HEP Goal status: ONGOING  2.  Pt will decrease worst back pain as  reported on NPRS by at least 3 points in order to demonstrate clinically significant reduction in back pain.  Baseline: EVAL= 8/10; 11/17/2022= 4/10 current and up to 7-8/10 at worst.  Goal status: ONGOING  3.  Pt will decrease mODI score by at least 13 points in order demonstrate clinically significant reduction in back pain/disability.   Baseline: EVAL=58%; 11/17/2022= 40% Goal status: PROGRESSING  4.  Pt will improve FOTO to target score of >55 to display perceived improvements in ability to complete ADL's.  Baseline: EVAL= 54; 11/14/22: 56 Goal status: PROGRESSING  5.  Patient (> 35 years old) will complete five times sit to stand test in <15 seconds indicating an increased LE strength and improved balance.  Baseline: EVAL = 20.33; 11/17/2022= 16.55 sec without UE Support Goal status: PROGRESSING  6.  Pt will increase by at least 0.13 m/s in order to demonstrate clinically significant improvement in community ambulation.   Baseline: EVAL = 0.97 m/s; 6/10=0.98 m/s  Goal status: PROGRESSING  7.  Patient will report ability to complete > or equal to 60 min grocery store outing or similar community activiities to return to her previous level of function.  Baseline: EVAL: patient reports difficulty tolerating outings in community. Goal status: INITIAL  8. Pt will improve BERG by at least 3 points in order to demonstrate clinically significant improvement in balance.    Baseline: 09/30/2022= 47/56  Goal status: INITIAL  9. Pt will increase by at least 56m (143ft) in order to demonstrate clinically significant improvement in cardiopulmonary endurance and community ambulation   Baseline: 09/23/2022= use of SPC RUE (pt preference): starting pain 4/10, then 5/10 at 353ft, and 6/10 at 566ft: audible RR dyspnea. Test terminated after 653ft, 3 minutes 15 seconds; 11/17/2022= 920 feet in 5 min 37 sec c/o 4/10 LBP and some DOE- O2 sat unable to read due to cold fingers- Respirations=  23/min  Goal status: PROGRESSING PLAN:  PT FREQUENCY: 1-2x/week  PT DURATION: 12 weeks  PLANNED INTERVENTIONS: Therapeutic exercises, Therapeutic activity, Neuromuscular re-education, Balance training, Gait training, Patient/Family education, Self Care, Joint mobilization, Joint manipulation, Stair training, Vestibular training, Canalith repositioning, Orthotic/Fit training, DME instructions, Dry Needling, Electrical stimulation, Spinal manipulation, Spinal mobilization, Cryotherapy, Moist heat, scar mobilization, Taping, and Manual therapy.  PLAN FOR NEXT SESSION: Continue to monitor vitals to add data for BP values. Scoliosis biased exercises and core strength, dynamic balance training.    5:24 PM, 12/01/22    Lenda Kelp, PT 12/01/2022, 5:24 PM   5:24 PM, 12/01/22 Physical Therapist - [facility]  Outpatient Physical Therapy- [facility] [phone]    5:24 PM, 12/01/22

## 2022-12-05 ENCOUNTER — Ambulatory Visit: Payer: Medicare HMO

## 2022-12-05 DIAGNOSIS — R278 Other lack of coordination: Secondary | ICD-10-CM

## 2022-12-05 DIAGNOSIS — G8929 Other chronic pain: Secondary | ICD-10-CM

## 2022-12-05 DIAGNOSIS — M6281 Muscle weakness (generalized): Secondary | ICD-10-CM

## 2022-12-05 DIAGNOSIS — R269 Unspecified abnormalities of gait and mobility: Secondary | ICD-10-CM | POA: Diagnosis not present

## 2022-12-05 DIAGNOSIS — R2681 Unsteadiness on feet: Secondary | ICD-10-CM

## 2022-12-05 DIAGNOSIS — R262 Difficulty in walking, not elsewhere classified: Secondary | ICD-10-CM

## 2022-12-05 DIAGNOSIS — R2689 Other abnormalities of gait and mobility: Secondary | ICD-10-CM

## 2022-12-05 NOTE — Therapy (Signed)
OUTPATIENT PHYSICAL THERAPY TREATMENT/RECERT  Patient Name: Kendra Benton MRN: [id number] DOB:Dec 27, 1940, 82 y.o., female Today's Date: 12/05/2022  END OF SESSION:  PT End of Session - 12/05/22 1025     Visit Number 14    Number of Visits 24    Date for PT Re-Evaluation 02/27/23    Authorization Type [employer] primary; [place] medicaid secondary    Authorization Time Period 09/11/22-12/04/22    Progress Note Due on Visit 20    PT Start Time 1017    Equipment Utilized During Treatment Gait belt    Activity Tolerance Patient tolerated treatment well;No increased pain    Behavior During Therapy WFL for tasks assessed/performed                 Past Medical History:  Diagnosis Date   Allergy    Anxiety    GERD (gastroesophageal reflux disease)    Headache    History of kidney stones    Hypertension    Myocardial infarction (HCC)    1997   Osteoporosis    Pneumonia    Past Surgical History:  Procedure Laterality Date   ABDOMINAL HYSTERECTOMY     patient has ovaries   APPENDECTOMY     ARTERY BIOPSY Right 12/30/2017   Procedure: BIOPSY TEMPORAL ARTERY;  Surgeon: Renford Dills, MD;  Location: [facility];  Service: Vascular;  Laterality: Right;   ARTERY BIOPSY Left 06/18/2018   Procedure: BIOPSY TEMPORAL ARTERY;  Surgeon: Renford Dills, MD;  Location: [facility];  Service: Vascular;  Laterality: Left;   BACK SURGERY     CARDIAC CATHETERIZATION     CATARACT EXTRACTION W/ INTRAOCULAR LENS  IMPLANT, BILATERAL Bilateral 2010   CHOLECYSTECTOMY N/A 02/28/2016   Procedure: LAPAROSCOPIC CHOLECYSTECTOMY WITH INTRAOPERATIVE CHOLANGIOGRAM;  Surgeon: Tiney Rouge III, MD;  Location: [facility];  Service: General;  Laterality: N/A;   LUMBAR FUSION  11/09/2016   L3-L5   SPINE SURGERY     Disc   TONSILLECTOMY     Patient Active Problem List   Diagnosis Date Noted   Fall (on) (from) other stairs and steps, initial encounter 08/06/2020   Lumbar facet joint syndrome 06/18/2020    Spondylosis without myelopathy or radiculopathy, lumbosacral region 06/18/2020   DDD (degenerative disc disease), lumbosacral 06/18/2020   COVID 06/04/2020   Chronic pain syndrome 05/16/2020   Pharmacologic therapy 05/16/2020   Disorder of skeletal system 05/16/2020   Problems influencing health status 05/16/2020   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   Chronic hip pain (2ry area of Pain) (Bilateral) (R>L) 05/16/2020   Chronic lower extremity pain (Left) 05/16/2020   Numbness and tingling of both feet (intermittent/recurrent) 05/16/2020   Pain in rib (Bilateral) (R>L) (intermittent) 05/16/2020   Chronic knee pain (Left) 05/16/2020   Patellar pain (Left) (chronic, intermittent) 05/16/2020   Hard of hearing 05/16/2020   Carcinoma, lung, 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/25/2019   S/P partial lobectomy of lung 02/25/2019   Mass of upper lobe of right lung 01/06/2019   Chronic nonintractable headache 06/14/2018   Head ache 12/03/2017   Sinus bradycardia 10/06/2017   Chronic low back pain (1ry area of Pain) (Bilateral) (L>R) w/o sciatica 08/17/2017   Panic attack as reaction to stress 08/17/2017   Anxiety 06/15/2017   Atypical chest pain 06/15/2017   Hyperlipidemia 06/15/2017   Cholecystitis with cholelithiasis 02/27/2016   Increased frequency of urination 01/27/2015   History of night sweats 05/01/2014   Dupuytren's contracture 07/18/2013   Inverted nipple 04/18/2013   Depression  04/23/2010   Fibromyalgia 04/23/2010   Hypercholesterolemia 04/23/2010   Essential hypertension 04/23/2010   Hypothyroidism 04/23/2010   Irritable colon 04/23/2010   Osteoarthritis 04/23/2010    PCP: Cain Sieve, MD  REFERRING PROVIDER: Cain Sieve, MD  REFERRING DIAG: Chronic low back pain; At risk for falls  Rationale for Evaluation and Treatment: Rehabilitation  THERAPY DIAG:  Abnormality of gait and mobility  Difficulty in walking, not elsewhere classified  Muscle weakness  (generalized)  Other lack of coordination  Unsteadiness on feet  Other abnormalities of gait and mobility  Chronic bilateral low back pain without sciatica  Chronic left-sided low back pain without sciatica  ONSET DATE: chronic but worse since Jan 2024  SUBJECTIVE:                                                                                                                                                                                           SUBJECTIVE STATEMENT:  Patient reports doing okay- back has been more sore this week but states still able to complete HEP and doing pretty well with no falls.    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 She has received PT in the past for condition with positive results and currently using pain management strategies including pain meds, Heat, Exercises, Yoga, lidocain patch, diclofenac topical gel, and biofreeze. She was HTN controlled with medication.  She reports still lives alone but having trouble with community and social outings and having to modify how she performs her ADLs and housework due to her chronic pain. PMH- Spondylosis, Scoliosis, HTN, Lung CA.  PAIN:  Are you having pain? Yes 3/10 across low back, Right flank  PRECAUTIONS: Fall  WEIGHT BEARING RESTRICTIONS: No  FALLS:  Has patient fallen in last 6 months? No- However patient reports some balance issues and near falls- using cane with community outings  PLOF: Independent  PATIENT GOALS: To decrease my pain  NEXT MD VISIT: unknown   OBJECTIVE:   TODAY'S TREATMENT:  DATE: 12/05/22     THEREX:  Supine Knee to chest- mobilization- 5 sec hold x 10  Supine Lower trunk rotation x 20 reps Supine faber stretch x 20 reps Longsit hip IR/ER x 20  reps  Physical therapy treatment session today consisted of completing assessment of goals and administration of testing as demonstrated and documented in flow sheet, treatment, and goals section of this note. Addition treatments may be found below.    PATIENT EDUCATION:  Education details: PT plan of care; Exercise technique  Person educated: Patient Education method: Explanation, Demonstration, Tactile cues, and Verbal cues Education comprehension: verbalized understanding, returned demonstration, verbal cues required, and tactile cues required  HOME EXERCISE PROGRAM: 11/14/22:  Side step c red band around knees, back against wall   Access Code: [email or web address] URL: [email or web address] Date: 10/13/2022 Prepared by: Maureen Ralphs  Exercises - Standing Quadratus Lumborum Stretch with Doorway  - 1 x daily - 7 x weekly - 3 sets - 20-30 sec hold - Standing Quadratus Lumborum Mobilization with Small Ball on Wall  - 1 x daily - 7 x weekly - 3 sets - 10 reps - Supine Quadratus Lumborum Stretch  - 1 x daily - 7 x weekly - 3 sets - 20-30 hold - 90/90 SI Joint Self-Correction with Dowel  - 1 x daily - 7 x weekly - 3 sets - 10 reps - Hooklying Isometric Hip Abduction with Belt  - 1 x daily - 7 x weekly - 3 sets - 10 reps - 5 hold    Access Code: [email or web address] URL: [email or web address] Date: 10/09/2022 Prepared by: Maureen Ralphs  Exercises - Standing Quadratus Lumborum Stretch with Doorway  - 1 x daily - 7 x weekly - 3 sets - 20-30 sec hold - Standing Quadratus Lumborum Mobilization with Small Ball on Wall  - 1 x daily - 7 x weekly - 3 sets - 10 reps - Supine Quadratus Lumborum Stretch  - 1 x daily - 7 x weekly - 3 sets - 20-30 hold  ASSESSMENT:  CLINICAL IMPRESSION: Patient presents with good motivation for recert . She is progressing very well overall- decreased subjective report of pain and improved FOTO score. She progressed with balance as well and  meeting BERG balance test. She is progressing overall with pain and will continue to benefit from Lumbar strengthening. Patient's condition has the potential to improve in response to therapy. Maximum improvement is yet to be obtained. The anticipated improvement is attainable and reasonable in a generally predictable time. Patient will benefit from skilled PT services to improve her low back pain and functional strength and decrease her risk of falling while improving her quality of life.    OBJECTIVE IMPAIRMENTS: Abnormal gait, decreased activity tolerance, decreased balance, decreased endurance, decreased mobility, difficulty walking, decreased strength, and pain.   ACTIVITY LIMITATIONS: carrying, lifting, bending, sitting, standing, squatting, sleeping, stairs, transfers, and bed mobility  PARTICIPATION LIMITATIONS: cleaning, laundry, shopping, community activity, and yard work  PERSONAL FACTORS: Age, Time since onset of injury/illness/exacerbation, and 3+ comorbidities: scoliosis, HTN, degenerative spine  are also affecting patient's functional outcome.   REHAB POTENTIAL: Good  CLINICAL DECISION MAKING: Stable/uncomplicated  EVALUATION COMPLEXITY: Moderate   GOALS: Goals reviewed with patient? Yes  SHORT TERM GOALS: Target date: 10/24/2022  Pt will decrease worst back pain as reported on NPRS by at least 2 points in order to demonstrate clinically significant reduction in back pain.  Baseline: Eval= 8/10; 11/17/2022= 4/10 current and up to 7-8/10 at worst.  Goal status:  MET  LONG TERM GOALS: Target date: 02/27/2023  Pt will be independent with Final HEP in order to improve strength and decrease back pain in order to improve pain-free function at home and work.  Baseline: EVAL= Patient reports needs update and review of exercises to assist with her back pain; 11/17/2022- Patient performing mostly self stretching and yoga based exercises and will continue to benefit from adding to  comprehensive HEP; 12/05/2022- HEP ongoing focusing more on Lumbar strengthening Goal status: ONGOING  2.  Pt will decrease worst back pain as reported on NPRS by at least 3 points in order to demonstrate clinically significant reduction in back pain.  Baseline: EVAL= 8/10; 11/17/2022= 4/10 current and up to 7-8/10 at worst. 12/05/2022= 4/10 current- and up to 6/10 depending on activity Goal status: ONGOING  3.  Pt will decrease mODI score by at least 13 points in order demonstrate clinically significant reduction in back pain/disability.   Baseline: EVAL=58%; 11/17/2022= 40%;12/05/2022= 44% Goal status: PROGRESSING  4.  Pt will improve FOTO to target score of >55 to display perceived improvements in ability to complete ADL's.  Baseline: EVAL= 54; 11/14/22: 56; 12/05/2022=63 Goal status: MET  5.  Patient (> 31 years old) will complete five times sit to stand test in <15 seconds indicating an increased LE strength and improved balance.  Baseline: EVAL = 20.33; 11/17/2022= 16.55 sec without UE Support; 12/05/2022= 16.47 sec without UE support Goal status: PROGRESSING  6.  Pt will increase by at least 0.13 m/s in order to demonstrate clinically significant improvement in community ambulation.   Baseline: EVAL = 0.97 m/s; 6/10=0.98 m/s; 12/05/2022= 0.98 m/s Goal status: PROGRESSING  7.  Patient will report ability to complete > or equal to 60 min grocery store outing or similar community activiities to return to her previous level of function.  Baseline: EVAL: patient reports difficulty tolerating outings in community. 12/05/2022= Patient reports up to around 40 min of continuous community activities.  Goal status: ONGOING   8. Pt will improve BERG by at least 3 points in order to demonstrate clinically significant improvement in balance.    Baseline: 09/30/2022= 47/56; 12/05/2022= 53/56  Goal status: MET  9. Pt will increase by at least 12m (161ft) in order to demonstrate clinically  significant improvement in cardiopulmonary endurance and community ambulation   Baseline: 09/23/2022= use of SPC RUE (pt preference): starting pain 4/10, then 5/10 at 367ft, and 6/10 at 553ft: audible RR dyspnea. Test terminated after 663ft, 3 minutes 15 seconds; 11/17/2022= 920 feet in 5 min 37 sec c/o 4/10 LBP and some DOE- O2 sat unable to read due to cold fingers- Respirations= 23/min; 12/05/2022= 1050 feet in 6 min  Goal status: PROGRESSING PLAN:  PT FREQUENCY: 1-2x/week  PT DURATION: 12 weeks  PLANNED INTERVENTIONS: Therapeutic exercises, Therapeutic activity, Neuromuscular re-education, Balance training, Gait training, Patient/Family education, Self Care, Joint mobilization, Joint manipulation, Stair training, Vestibular training, Canalith repositioning, Orthotic/Fit training, DME instructions, Dry Needling, Electrical stimulation, Spinal manipulation, Spinal mobilization, Cryotherapy, Moist heat, scar mobilization, Taping, and Manual therapy.  PLAN FOR NEXT SESSION: Continue to monitor vitals to add data for BP values. Scoliosis biased exercises and core strength, dynamic balance training.    10:26 AM, 12/05/22    Lenda Kelp, PT 12/05/2022, 10:26 AM   10:26 AM, 12/05/22 Physical Therapist - [facility]  Outpatient Physical Therapy- [facility] [phone]    10:26 AM, 12/05/22

## 2022-12-10 ENCOUNTER — Ambulatory Visit: Payer: Medicare HMO | Attending: Internal Medicine

## 2022-12-10 DIAGNOSIS — R2681 Unsteadiness on feet: Secondary | ICD-10-CM | POA: Diagnosis present

## 2022-12-10 DIAGNOSIS — R278 Other lack of coordination: Secondary | ICD-10-CM | POA: Diagnosis present

## 2022-12-10 DIAGNOSIS — G8929 Other chronic pain: Secondary | ICD-10-CM | POA: Insufficient documentation

## 2022-12-10 DIAGNOSIS — M545 Low back pain, unspecified: Secondary | ICD-10-CM | POA: Diagnosis present

## 2022-12-10 DIAGNOSIS — R262 Difficulty in walking, not elsewhere classified: Secondary | ICD-10-CM | POA: Insufficient documentation

## 2022-12-10 DIAGNOSIS — R269 Unspecified abnormalities of gait and mobility: Secondary | ICD-10-CM | POA: Insufficient documentation

## 2022-12-10 DIAGNOSIS — R2689 Other abnormalities of gait and mobility: Secondary | ICD-10-CM | POA: Diagnosis present

## 2022-12-10 DIAGNOSIS — M6281 Muscle weakness (generalized): Secondary | ICD-10-CM | POA: Diagnosis present

## 2022-12-10 NOTE — Therapy (Unsigned)
OUTPATIENT PHYSICAL THERAPY TREATMENT/RECERT  Patient Name: Kendra Benton MRN: [id number] DOB:03/25/1941, 82 y.o., female Today's Date: 12/11/2022  END OF SESSION:  PT End of Session - 12/10/22 1020     Visit Number 15    Number of Visits 24    Date for PT Re-Evaluation 02/27/23    Authorization Type [employer] primary; [place] medicaid secondary    Authorization Time Period 09/11/22-12/04/22    Progress Note Due on Visit 20    PT Start Time 1020    PT Stop Time 1059    PT Time Calculation (min) 39 min    Equipment Utilized During Treatment Gait belt    Activity Tolerance Patient tolerated treatment well;No increased pain    Behavior During Therapy WFL for tasks assessed/performed                 Past Medical History:  Diagnosis Date   Allergy    Anxiety    GERD (gastroesophageal reflux disease)    Headache    History of kidney stones    Hypertension    Myocardial infarction (HCC)    1997   Osteoporosis    Pneumonia    Past Surgical History:  Procedure Laterality Date   ABDOMINAL HYSTERECTOMY     patient has ovaries   APPENDECTOMY     ARTERY BIOPSY Right 12/30/2017   Procedure: BIOPSY TEMPORAL ARTERY;  Surgeon: Renford Dills, MD;  Location: [facility];  Service: Vascular;  Laterality: Right;   ARTERY BIOPSY Left 06/18/2018   Procedure: BIOPSY TEMPORAL ARTERY;  Surgeon: Renford Dills, MD;  Location: [facility];  Service: Vascular;  Laterality: Left;   BACK SURGERY     CARDIAC CATHETERIZATION     CATARACT EXTRACTION W/ INTRAOCULAR LENS  IMPLANT, BILATERAL Bilateral 2010   CHOLECYSTECTOMY N/A 02/28/2016   Procedure: LAPAROSCOPIC CHOLECYSTECTOMY WITH INTRAOPERATIVE CHOLANGIOGRAM;  Surgeon: Tiney Rouge III, MD;  Location: [facility];  Service: General;  Laterality: N/A;   LUMBAR FUSION  11/09/2016   L3-L5   SPINE SURGERY     Disc   TONSILLECTOMY     Patient Active Problem List   Diagnosis Date Noted   Fall (on) (from) other stairs and steps, initial encounter  08/06/2020   Lumbar facet joint syndrome 06/18/2020   Spondylosis without myelopathy or radiculopathy, lumbosacral region 06/18/2020   DDD (degenerative disc disease), lumbosacral 06/18/2020   COVID 06/04/2020   Chronic pain syndrome 05/16/2020   Pharmacologic therapy 05/16/2020   Disorder of skeletal system 05/16/2020   Problems influencing health status 05/16/2020   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   Chronic hip pain (2ry area of Pain) (Bilateral) (R>L) 05/16/2020   Chronic lower extremity pain (Left) 05/16/2020   Numbness and tingling of both feet (intermittent/recurrent) 05/16/2020   Pain in rib (Bilateral) (R>L) (intermittent) 05/16/2020   Chronic knee pain (Left) 05/16/2020   Patellar pain (Left) (chronic, intermittent) 05/16/2020   Hard of hearing 05/16/2020   Carcinoma, lung, 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/25/2019   S/P partial lobectomy of lung 02/25/2019   Mass of upper lobe of right lung 01/06/2019   Chronic nonintractable headache 06/14/2018   Head ache 12/03/2017   Sinus bradycardia 10/06/2017   Chronic low back pain (1ry area of Pain) (Bilateral) (L>R) w/o sciatica 08/17/2017   Panic attack as reaction to stress 08/17/2017   Anxiety 06/15/2017   Atypical chest pain 06/15/2017   Hyperlipidemia 06/15/2017   Cholecystitis with cholelithiasis 02/27/2016   Increased frequency of urination 01/27/2015   History of  night sweats 05/01/2014   Dupuytren's contracture 07/18/2013   Inverted nipple 04/18/2013   Depression 04/23/2010   Fibromyalgia 04/23/2010   Hypercholesterolemia 04/23/2010   Essential hypertension 04/23/2010   Hypothyroidism 04/23/2010   Irritable colon 04/23/2010   Osteoarthritis 04/23/2010    PCP: Cain Sieve, MD  REFERRING PROVIDER: Cain Sieve, MD  REFERRING DIAG: Chronic low back pain; At risk for falls  Rationale for Evaluation and Treatment: Rehabilitation  THERAPY DIAG:  Abnormality of gait and mobility  Difficulty in  walking, not elsewhere classified  Muscle weakness (generalized)  Other lack of coordination  Other abnormalities of gait and mobility  Chronic bilateral low back pain without sciatica  Unsteadiness on feet  ONSET DATE: chronic but worse since Jan 2024  SUBJECTIVE:                                                                                                                                                                                           SUBJECTIVE STATEMENT:  Patient reports doing okay but having some sharp left sided low back pain.    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 She has received PT in the past for condition with positive results and currently using pain management strategies including pain meds, Heat, Exercises, Yoga, lidocain patch, diclofenac topical gel, and biofreeze. She was HTN controlled with medication.  She reports still lives alone but having trouble with community and social outings and having to modify how she performs her ADLs and housework due to her chronic pain. PMH- Spondylosis, Scoliosis, HTN, Lung CA.  PAIN:  Are you having pain? Yes 3/10 across low back, Right flank  PRECAUTIONS: Fall  WEIGHT BEARING RESTRICTIONS: No  FALLS:  Has patient fallen in last 6 months? No- However patient reports some balance issues and near falls- using cane with community outings  PLOF: Independent  PATIENT GOALS: To decrease my pain  NEXT MD VISIT: unknown   OBJECTIVE:   TODAY'S TREATMENT:  DATE:  12/10/2022     THEREX:   Seated lumbar flex stretch-hold 30 sec x 2 to left, neutral, and to right side  Seated  faber stretch x 30 sec x 3 each Side  Sit to stand (TrA contraction) holding onto 5Kg ball at chest x 10 reps     Standing QL with 5 #DB x 10 reps each side  Standing Lumbar ext at wall x 15 reps (min report of onset of pain so only 1 set)   Standing wall posture stretch x 60 sec x 2 (VC for positioning)   Walking with 5# DB 175 feet ( patient reports pulling and feeling like she was off balance with only weight on one side)   Manual therapy: 5 min  STM to left SI region region in sidelye with noticeable taught bands along left side paraspinal into upper gluteal region.    PATIENT EDUCATION:  Education details: PT plan of care; Exercise technique  Person educated: Patient Education method: Explanation, Demonstration, Tactile cues, and Verbal cues Education comprehension: verbalized understanding, returned demonstration, verbal cues required, and tactile cues required  HOME EXERCISE PROGRAM: 11/14/22:  Side step c red band around knees, back against wall   Access Code: [email or web address] URL: [email or web address] Date: 10/13/2022 Prepared by: Maureen Ralphs  Exercises - Standing Quadratus Lumborum Stretch with Doorway  - 1 x daily - 7 x weekly - 3 sets - 20-30 sec hold - Standing Quadratus Lumborum Mobilization with Small Ball on Wall  - 1 x daily - 7 x weekly - 3 sets - 10 reps - Supine Quadratus Lumborum Stretch  - 1 x daily - 7 x weekly - 3 sets - 20-30 hold - 90/90 SI Joint Self-Correction with Dowel  - 1 x daily - 7 x weekly - 3 sets - 10 reps - Hooklying Isometric Hip Abduction with Belt  - 1 x daily - 7 x weekly - 3 sets - 10 reps - 5 hold    Access Code: [email or web address] URL: [email or web address] Date: 10/09/2022 Prepared by: Maureen Ralphs  Exercises - Standing Quadratus Lumborum Stretch with Doorway  - 1 x daily - 7 x weekly - 3 sets - 20-30 sec hold - Standing Quadratus Lumborum Mobilization with Small Ball on Wall  - 1 x daily - 7 x weekly - 3 sets - 10 reps - Supine Quadratus Lumborum Stretch  - 1 x daily - 7 x weekly - 3 sets - 20-30  hold  ASSESSMENT:  CLINICAL IMPRESSION: Continued to push functional strengthening to help manage her chronic low back pain. She was able to complete most therex today without pain and responded well to short duration manual STM to left low back. She continues to add some resistance and progress with functional strengthening despite ongoing back pain. Patient will benefit from skilled PT services to improve her low back pain and functional strength and decrease her risk of falling while improving her quality of life.    OBJECTIVE IMPAIRMENTS: Abnormal gait, decreased activity tolerance, decreased balance, decreased endurance, decreased mobility, difficulty walking, decreased strength, and pain.   ACTIVITY LIMITATIONS: carrying, lifting, bending, sitting, standing, squatting, sleeping, stairs, transfers, and bed mobility  PARTICIPATION LIMITATIONS: cleaning, laundry, shopping, community activity, and yard work  PERSONAL FACTORS: Age, Time since onset of injury/illness/exacerbation, and 3+ comorbidities: scoliosis, HTN, degenerative spine  are also affecting patient's functional outcome.   REHAB POTENTIAL: Good  CLINICAL DECISION MAKING: Stable/uncomplicated  EVALUATION COMPLEXITY: Moderate   GOALS: Goals reviewed with patient? Yes  SHORT TERM GOALS: Target date: 10/24/2022  Pt will decrease worst back pain as reported on NPRS by at least 2 points in order to demonstrate clinically significant reduction in back pain.  Baseline: Eval= 8/10; 11/17/2022= 4/10 current and up to 7-8/10 at worst.  Goal status: MET  LONG TERM GOALS: Target date: 02/27/2023  Pt will be independent with Final HEP in order to improve strength and decrease back pain in order to improve pain-free function at home and work.  Baseline: EVAL= Patient reports needs update and review of exercises to assist with her back pain; 11/17/2022- Patient performing mostly self stretching and yoga based exercises and will continue  to benefit from adding to comprehensive HEP; 12/05/2022- HEP ongoing focusing more on Lumbar strengthening Goal status: ONGOING  2.  Pt will decrease worst back pain as reported on NPRS by at least 3 points in order to demonstrate clinically significant reduction in back pain.  Baseline: EVAL= 8/10; 11/17/2022= 4/10 current and up to 7-8/10 at worst. 12/05/2022= 4/10 current- and up to 6/10 depending on activity Goal status: ONGOING  3.  Pt will decrease mODI score by at least 13 points in order demonstrate clinically significant reduction in back pain/disability.   Baseline: EVAL=58%; 11/17/2022= 40%;12/05/2022= 44% Goal status: PROGRESSING  4.  Pt will improve FOTO to target score of >55 to display perceived improvements in ability to complete ADL's.  Baseline: EVAL= 54; 11/14/22: 56; 12/05/2022=63 Goal status: MET  5.  Patient (> 20 years old) will complete five times sit to stand test in <15 seconds indicating an increased LE strength and improved balance.  Baseline: EVAL = 20.33; 11/17/2022= 16.55 sec without UE Support; 12/05/2022= 16.47 sec without UE support Goal status: PROGRESSING  6.  Pt will increase by at least 0.13 m/s in order to demonstrate clinically significant improvement in community ambulation.   Baseline: EVAL = 0.97 m/s; 6/10=0.98 m/s; 12/05/2022= 0.98 m/s Goal status: PROGRESSING  7.  Patient will report ability to complete > or equal to 60 min grocery store outing or similar community activiities to return to her previous level of function.  Baseline: EVAL: patient reports difficulty tolerating outings in community. 12/05/2022= Patient reports up to around 40 min of continuous community activities.  Goal status: ONGOING   8. Pt will improve BERG by at least 3 points in order to demonstrate clinically significant improvement in balance.    Baseline: 09/30/2022= 47/56; 12/05/2022= 53/56  Goal status: MET  9. Pt will increase by at least 46m (114ft) in order to  demonstrate clinically significant improvement in cardiopulmonary endurance and community ambulation   Baseline: 09/23/2022= use of SPC RUE (pt preference): starting pain 4/10, then 5/10 at 337ft, and 6/10 at 560ft: audible RR dyspnea. Test terminated after 610ft, 3 minutes 15 seconds; 11/17/2022= 920 feet in 5 min 37 sec c/o 4/10 LBP and some DOE- O2 sat unable to read due to cold fingers- Respirations= 23/min; 12/05/2022= 1050 feet in 6 min  Goal status: PROGRESSING PLAN:  PT FREQUENCY: 1-2x/week  PT DURATION: 12 weeks  PLANNED INTERVENTIONS: Therapeutic exercises, Therapeutic activity, Neuromuscular re-education, Balance training, Gait training, Patient/Family education, Self Care, Joint mobilization, Joint manipulation, Stair training, Vestibular training, Canalith repositioning, Orthotic/Fit training, DME instructions, Dry Needling, Electrical stimulation, Spinal manipulation, Spinal mobilization, Cryotherapy, Moist heat, scar mobilization, Taping, and Manual therapy.  PLAN FOR NEXT SESSION: Continue to monitor vitals to add data for BP values. Scoliosis biased exercises and core strength, dynamic balance training.    9:43  AM, 12/11/22    Lenda Kelp, PT 12/11/2022, 9:43 AM   9:43 AM, 12/11/22 Physical Therapist - [facility]  Outpatient Physical Therapy- [facility] [phone]    9:43 AM, 12/11/22

## 2022-12-17 ENCOUNTER — Ambulatory Visit: Payer: Medicare HMO

## 2022-12-17 DIAGNOSIS — R278 Other lack of coordination: Secondary | ICD-10-CM

## 2022-12-17 DIAGNOSIS — G8929 Other chronic pain: Secondary | ICD-10-CM

## 2022-12-17 DIAGNOSIS — R269 Unspecified abnormalities of gait and mobility: Secondary | ICD-10-CM | POA: Diagnosis not present

## 2022-12-17 DIAGNOSIS — M6281 Muscle weakness (generalized): Secondary | ICD-10-CM

## 2022-12-17 DIAGNOSIS — R262 Difficulty in walking, not elsewhere classified: Secondary | ICD-10-CM

## 2022-12-17 DIAGNOSIS — R2689 Other abnormalities of gait and mobility: Secondary | ICD-10-CM

## 2022-12-17 NOTE — Therapy (Signed)
OUTPATIENT PHYSICAL THERAPY TREATMENT  Patient Name: Kendra Benton MRN: [id number] DOB:1941/01/24, 82 y.o., female Today's Date: 12/18/2022  END OF SESSION:  PT End of Session - 12/17/22 1323     Visit Number 16    Number of Visits 24    Date for PT Re-Evaluation 02/27/23    Authorization Type [employer] primary;  medicaid secondary    Authorization Time Period 09/11/22-12/04/22    Progress Note Due on Visit 20    PT Start Time 1315    PT Stop Time 1359    PT Time Calculation (min) 44 min    Equipment Utilized During Treatment Gait belt    Activity Tolerance Patient tolerated treatment well;No increased pain    Behavior During Therapy WFL for tasks assessed/performed                 Past Medical History:  Diagnosis Date   Allergy    Anxiety    GERD (gastroesophageal reflux disease)    Headache    History of kidney stones    Hypertension    Myocardial infarction (HCC)    1997   Osteoporosis    Pneumonia    Past Surgical History:  Procedure Laterality Date   ABDOMINAL HYSTERECTOMY     patient has ovaries   APPENDECTOMY     ARTERY BIOPSY Right 12/30/2017   Procedure: BIOPSY TEMPORAL ARTERY;  Surgeon: Renford Dills, MD;  Location: [facility];  Service: Vascular;  Laterality: Right;   ARTERY BIOPSY Left 06/18/2018   Procedure: BIOPSY TEMPORAL ARTERY;  Surgeon: Renford Dills, MD;  Location: [facility];  Service: Vascular;  Laterality: Left;   BACK SURGERY     CARDIAC CATHETERIZATION     CATARACT EXTRACTION W/ INTRAOCULAR LENS  IMPLANT, BILATERAL Bilateral 2010   CHOLECYSTECTOMY N/A 02/28/2016   Procedure: LAPAROSCOPIC CHOLECYSTECTOMY WITH INTRAOPERATIVE CHOLANGIOGRAM;  Surgeon: Tiney Rouge III, MD;  Location: [facility];  Service: General;  Laterality: N/A;   LUMBAR FUSION  11/09/2016   L3-L5   SPINE SURGERY     Disc   TONSILLECTOMY     Patient Active Problem List   Diagnosis Date Noted   Fall (on) (from) other stairs and steps, initial encounter  08/06/2020   Lumbar facet joint syndrome 06/18/2020   Spondylosis without myelopathy or radiculopathy, lumbosacral region 06/18/2020   DDD (degenerative disc disease), lumbosacral 06/18/2020   COVID 06/04/2020   Chronic pain syndrome 05/16/2020   Pharmacologic therapy 05/16/2020   Disorder of skeletal system 05/16/2020   Problems influencing health status 05/16/2020   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   Chronic hip pain (2ry area of Pain) (Bilateral) (R>L) 05/16/2020   Chronic lower extremity pain (Left) 05/16/2020   Numbness and tingling of both feet (intermittent/recurrent) 05/16/2020   Pain in rib (Bilateral) (R>L) (intermittent) 05/16/2020   Chronic knee pain (Left) 05/16/2020   Patellar pain (Left) (chronic, intermittent) 05/16/2020   Hard of hearing 05/16/2020   Carcinoma, lung, 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/25/2019   S/P partial lobectomy of lung 02/25/2019   Mass of upper lobe of right lung 01/06/2019   Chronic nonintractable headache 06/14/2018   Head ache 12/03/2017   Sinus bradycardia 10/06/2017   Chronic low back pain (1ry area of Pain) (Bilateral) (L>R) w/o sciatica 08/17/2017   Panic attack as reaction to stress 08/17/2017   Anxiety 06/15/2017   Atypical chest pain 06/15/2017   Hyperlipidemia 06/15/2017   Cholecystitis with cholelithiasis 02/27/2016   Increased frequency of urination 01/27/2015   History of  night sweats 05/01/2014   Dupuytren's contracture 07/18/2013   Inverted nipple 04/18/2013   Depression 04/23/2010   Fibromyalgia 04/23/2010   Hypercholesterolemia 04/23/2010   Essential hypertension 04/23/2010   Hypothyroidism 04/23/2010   Irritable colon 04/23/2010   Osteoarthritis 04/23/2010    PCP: Cain Sieve, MD  REFERRING PROVIDER: Cain Sieve, MD  REFERRING DIAG: Chronic low back pain; At risk for falls  Rationale for Evaluation and Treatment: Rehabilitation  THERAPY DIAG:  Abnormality of gait and mobility  Difficulty in  walking, not elsewhere classified  Muscle weakness (generalized)  Other lack of coordination  Other abnormalities of gait and mobility  Chronic bilateral low back pain without sciatica  ONSET DATE: chronic but worse since Jan 2024  SUBJECTIVE:                                                                                                                                                                                           SUBJECTIVE STATEMENT:  Patient reports continued overall increased low back pain over past week. Reports has not been out much due to hot weather.    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 She has received PT in the past for condition with positive results and currently using pain management strategies including pain meds, Heat, Exercises, Yoga, lidocain patch, diclofenac topical gel, and biofreeze. She was HTN controlled with medication.  She reports still lives alone but having trouble with community and social outings and having to modify how she performs her ADLs and housework due to her chronic pain. PMH- Spondylosis, Scoliosis, HTN, Lung CA.  PAIN:  Are you having pain? Yes 4/10 across low back, Right flank  PRECAUTIONS: Fall  WEIGHT BEARING RESTRICTIONS: No  FALLS:  Has patient fallen in last 6 months? No- However patient reports some balance issues and near falls- using cane with community outings  PLOF: Independent  PATIENT GOALS: To decrease my pain  NEXT MD VISIT: unknown   OBJECTIVE:   TODAY'S TREATMENT:  DATE:  12/18/22    BP= 162/57 mmHg  (sitting)  BP= 166/60 mmHg (after 1 min standing)   THEREX:   Reviewed Lumbar/LE Stretching to mobilize lower spine/hips:  - hip circles (hooklye) x 20 reps each CW/CCW  - alt  knee to chest - x 10 reps each  - Figure 4 mob- hold 5 sec x 10 each LE  - Legs crossed with lower trunk rotation- 10 reps each side  - Sidelye open book thoracic rotation   Sidelye Hip ER (Clamshell) - AROM x 10 reps; 2nd set using GTB x 10 reps each LE  Attempted Sit to stand (TrA contraction) holding onto 5Kg ball at chest - Patient performed 1x but too fatigued to continue with weight but was able to perform 10 reps without any resistance.    NMR:   SLS -multiple attempts to hold- usually 3-5 sec with goal for HEP set up to 10 sec hold each LE. Patient verbalized understanding Tandem stance with multiple attempts to hold- performed up to 15-20 sec today with goal for HEP set up to 10 sec hold each LE. Patient verbalized understanding Dynamic hip march on airex pad without UE Support- 25 reps each LE- VC to slow down to focus on SLS and balance  Tandem standing on airex pad  - multiple attempts each side -very unsteady with reaching for UE support on non-compliant surface.     PATIENT EDUCATION:  Education details: PT plan of care; Exercise technique  Person educated: Patient Education method: Explanation, Demonstration, Tactile cues, and Verbal cues Education comprehension: verbalized understanding, returned demonstration, verbal cues required, and tactile cues required  HOME EXERCISE PROGRAM: 11/14/22:  Side step c red band around knees, back against wall   Access Code: [email or web address] URL: [email or web address] Date: 10/13/2022 Prepared by: Maureen Ralphs  Exercises - Standing Quadratus Lumborum Stretch with Doorway  - 1 x daily - 7 x weekly - 3 sets - 20-30 sec hold - Standing Quadratus Lumborum Mobilization with Small Ball on Wall  - 1 x daily - 7 x weekly - 3 sets - 10 reps - Supine Quadratus Lumborum Stretch  - 1 x daily - 7 x weekly - 3 sets - 20-30 hold - 90/90 SI Joint Self-Correction with Dowel  - 1 x daily - 7 x weekly - 3 sets - 10 reps - Hooklying  Isometric Hip Abduction with Belt  - 1 x daily - 7 x weekly - 3 sets - 10 reps - 5 hold    Access Code: [email or web address] URL: [email or web address] Date: 10/09/2022 Prepared by: Maureen Ralphs  Exercises - Standing Quadratus Lumborum Stretch with Doorway  - 1 x daily - 7 x weekly - 3 sets - 20-30 sec hold - Standing Quadratus Lumborum Mobilization with Small Ball on Wall  - 1 x daily - 7 x weekly - 3 sets - 10 reps - Supine Quadratus Lumborum Stretch  - 1 x daily - 7 x weekly - 3 sets - 20-30 hold  ASSESSMENT:  CLINICAL IMPRESSION: Patient continues to report more pain over past week but able to mobilize spine and perform ROM activities with very little cueing today. With blood pressure stabilized she was able to progress to more dynamic balance activities. She did not report any increased low back pain with balance activities today. Challenged with SLS and tandem activities and will benefit form further training in these areas. Patient will benefit from skilled PT services to improve her low back pain and functional strength and decrease  her risk of falling while improving her quality of life.    OBJECTIVE IMPAIRMENTS: Abnormal gait, decreased activity tolerance, decreased balance, decreased endurance, decreased mobility, difficulty walking, decreased strength, and pain.   ACTIVITY LIMITATIONS: carrying, lifting, bending, sitting, standing, squatting, sleeping, stairs, transfers, and bed mobility  PARTICIPATION LIMITATIONS: cleaning, laundry, shopping, community activity, and yard work  PERSONAL FACTORS: Age, Time since onset of injury/illness/exacerbation, and 3+ comorbidities: scoliosis, HTN, degenerative spine  are also affecting patient's functional outcome.   REHAB POTENTIAL: Good  CLINICAL DECISION MAKING: Stable/uncomplicated  EVALUATION COMPLEXITY: Moderate   GOALS: Goals reviewed with patient? Yes  SHORT TERM GOALS: Target date: 10/24/2022  Pt will decrease  worst back pain as reported on NPRS by at least 2 points in order to demonstrate clinically significant reduction in back pain.  Baseline: Eval= 8/10; 11/17/2022= 4/10 current and up to 7-8/10 at worst.  Goal status: MET  LONG TERM GOALS: Target date: 02/27/2023  Pt will be independent with Final HEP in order to improve strength and decrease back pain in order to improve pain-free function at home and work.  Baseline: EVAL= Patient reports needs update and review of exercises to assist with her back pain; 11/17/2022- Patient performing mostly self stretching and yoga based exercises and will continue to benefit from adding to comprehensive HEP; 12/05/2022- HEP ongoing focusing more on Lumbar strengthening Goal status: ONGOING  2.  Pt will decrease worst back pain as reported on NPRS by at least 3 points in order to demonstrate clinically significant reduction in back pain.  Baseline: EVAL= 8/10; 11/17/2022= 4/10 current and up to 7-8/10 at worst. 12/05/2022= 4/10 current- and up to 6/10 depending on activity Goal status: ONGOING  3.  Pt will decrease mODI score by at least 13 points in order demonstrate clinically significant reduction in back pain/disability.   Baseline: EVAL=58%; 11/17/2022= 40%;12/05/2022= 44% Goal status: PROGRESSING  4.  Pt will improve FOTO to target score of >55 to display perceived improvements in ability to complete ADL's.  Baseline: EVAL= 54; 11/14/22: 56; 12/05/2022=63 Goal status: MET  5.  Patient (> 43 years old) will complete five times sit to stand test in <15 seconds indicating an increased LE strength and improved balance.  Baseline: EVAL = 20.33; 11/17/2022= 16.55 sec without UE Support; 12/05/2022= 16.47 sec without UE support Goal status: PROGRESSING  6.  Pt will increase by at least 0.13 m/s in order to demonstrate clinically significant improvement in community ambulation.   Baseline: EVAL = 0.97 m/s; 6/10=0.98 m/s; 12/05/2022= 0.98 m/s Goal status:  PROGRESSING  7.  Patient will report ability to complete > or equal to 60 min grocery store outing or similar community activiities to return to her previous level of function.  Baseline: EVAL: patient reports difficulty tolerating outings in community. 12/05/2022= Patient reports up to around 40 min of continuous community activities.  Goal status: ONGOING   8. Pt will improve BERG by at least 3 points in order to demonstrate clinically significant improvement in balance.    Baseline: 09/30/2022= 47/56; 12/05/2022= 53/56  Goal status: MET  9. Pt will increase by at least 41m (191ft) in order to demonstrate clinically significant improvement in cardiopulmonary endurance and community ambulation   Baseline: 09/23/2022= use of SPC RUE (pt preference): starting pain 4/10, then 5/10 at 331ft, and 6/10 at 515ft: audible RR dyspnea. Test terminated after 6104ft, 3 minutes 15 seconds; 11/17/2022= 920 feet in 5 min 37 sec c/o 4/10 LBP and some DOE- O2 sat  unable to read due to cold fingers- Respirations= 23/min; 12/05/2022= 1050 feet in 6 min  Goal status: PROGRESSING PLAN:  PT FREQUENCY: 1-2x/week  PT DURATION: 12 weeks  PLANNED INTERVENTIONS: Therapeutic exercises, Therapeutic activity, Neuromuscular re-education, Balance training, Gait training, Patient/Family education, Self Care, Joint mobilization, Joint manipulation, Stair training, Vestibular training, Canalith repositioning, Orthotic/Fit training, DME instructions, Dry Needling, Electrical stimulation, Spinal manipulation, Spinal mobilization, Cryotherapy, Moist heat, scar mobilization, Taping, and Manual therapy.  PLAN FOR NEXT SESSION: Continue to monitor vitals to add data for BP values. Scoliosis biased exercises and core strength, dynamic balance training.    7:17 AM, 12/18/22    Lenda Kelp, PT 12/18/2022, 7:17 AM   7:17 AM, 12/18/22 Physical Therapist - [facility]  Outpatient Physical  Therapy- [facility] [phone]    7:17 AM, 12/18/22

## 2022-12-19 ENCOUNTER — Ambulatory Visit: Payer: Medicare HMO

## 2022-12-19 DIAGNOSIS — M6281 Muscle weakness (generalized): Secondary | ICD-10-CM

## 2022-12-19 DIAGNOSIS — M545 Low back pain, unspecified: Secondary | ICD-10-CM

## 2022-12-19 DIAGNOSIS — R2689 Other abnormalities of gait and mobility: Secondary | ICD-10-CM

## 2022-12-19 DIAGNOSIS — R262 Difficulty in walking, not elsewhere classified: Secondary | ICD-10-CM

## 2022-12-19 DIAGNOSIS — R2681 Unsteadiness on feet: Secondary | ICD-10-CM

## 2022-12-19 DIAGNOSIS — R269 Unspecified abnormalities of gait and mobility: Secondary | ICD-10-CM

## 2022-12-19 DIAGNOSIS — R278 Other lack of coordination: Secondary | ICD-10-CM

## 2022-12-19 NOTE — Therapy (Signed)
OUTPATIENT PHYSICAL THERAPY TREATMENT  Patient Name: Kendra Benton MRN: [id number] DOB:18-May-1941, 82 y.o., female Today's Date: 12/19/2022  END OF SESSION:  PT End of Session - 12/19/22 1020     Visit Number 17    Number of Visits 24    Date for PT Re-Evaluation 02/27/23    Authorization Type [employer] primary; [place] medicaid secondary    Authorization Time Period 09/11/22-12/04/22    Progress Note Due on Visit 20    PT Start Time 1016    PT Stop Time 1059    PT Time Calculation (min) 43 min    Equipment Utilized During Treatment Gait belt    Activity Tolerance Patient tolerated treatment well;No increased pain    Behavior During Therapy WFL for tasks assessed/performed                 Past Medical History:  Diagnosis Date   Allergy    Anxiety    GERD (gastroesophageal reflux disease)    Headache    History of kidney stones    Hypertension    Myocardial infarction (HCC)    1997   Osteoporosis    Pneumonia    Past Surgical History:  Procedure Laterality Date   ABDOMINAL HYSTERECTOMY     patient has ovaries   APPENDECTOMY     ARTERY BIOPSY Right 12/30/2017   Procedure: BIOPSY TEMPORAL ARTERY;  Surgeon: Renford Dills, MD;  Location: [facility];  Service: Vascular;  Laterality: Right;   ARTERY BIOPSY Left 06/18/2018   Procedure: BIOPSY TEMPORAL ARTERY;  Surgeon: Renford Dills, MD;  Location: [facility];  Service: Vascular;  Laterality: Left;   BACK SURGERY     CARDIAC CATHETERIZATION     CATARACT EXTRACTION W/ INTRAOCULAR LENS  IMPLANT, BILATERAL Bilateral 2010   CHOLECYSTECTOMY N/A 02/28/2016   Procedure: LAPAROSCOPIC CHOLECYSTECTOMY WITH INTRAOPERATIVE CHOLANGIOGRAM;  Surgeon: Tiney Rouge III, MD;  Location: [facility];  Service: General;  Laterality: N/A;   LUMBAR FUSION  11/09/2016   L3-L5   SPINE SURGERY     Disc   TONSILLECTOMY     Patient Active Problem List   Diagnosis Date Noted   Fall (on) (from) other stairs and steps, initial encounter  08/06/2020   Lumbar facet joint syndrome 06/18/2020   Spondylosis without myelopathy or radiculopathy, lumbosacral region 06/18/2020   DDD (degenerative disc disease), lumbosacral 06/18/2020   COVID 06/04/2020   Chronic pain syndrome 05/16/2020   Pharmacologic therapy 05/16/2020   Disorder of skeletal system 05/16/2020   Problems influencing health status 05/16/2020   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   Chronic hip pain (2ry area of Pain) (Bilateral) (R>L) 05/16/2020   Chronic lower extremity pain (Left) 05/16/2020   Numbness and tingling of both feet (intermittent/recurrent) 05/16/2020   Pain in rib (Bilateral) (R>L) (intermittent) 05/16/2020   Chronic knee pain (Left) 05/16/2020   Patellar pain (Left) (chronic, intermittent) 05/16/2020   Hard of hearing 05/16/2020   Carcinoma, lung, 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/25/2019   S/P partial lobectomy of lung 02/25/2019   Mass of upper lobe of right lung 01/06/2019   Chronic nonintractable headache 06/14/2018   Head ache 12/03/2017   Sinus bradycardia 10/06/2017   Chronic low back pain (1ry area of Pain) (Bilateral) (L>R) w/o sciatica 08/17/2017   Panic attack as reaction to stress 08/17/2017   Anxiety 06/15/2017   Atypical chest pain 06/15/2017   Hyperlipidemia 06/15/2017   Cholecystitis with cholelithiasis 02/27/2016   Increased frequency of urination 01/27/2015   History of  night sweats 05/01/2014   Dupuytren's contracture 07/18/2013   Inverted nipple 04/18/2013   Depression 04/23/2010   Fibromyalgia 04/23/2010   Hypercholesterolemia 04/23/2010   Essential hypertension 04/23/2010   Hypothyroidism 04/23/2010   Irritable colon 04/23/2010   Osteoarthritis 04/23/2010    PCP: Cain Sieve, MD  REFERRING PROVIDER: Cain Sieve, MD  REFERRING DIAG: Chronic low back pain; At risk for falls  Rationale for Evaluation and Treatment: Rehabilitation  THERAPY DIAG:  Abnormality of gait and mobility  Difficulty in  walking, not elsewhere classified  Muscle weakness (generalized)  Other lack of coordination  Other abnormalities of gait and mobility  Chronic bilateral low back pain without sciatica  Unsteadiness on feet  ONSET DATE: chronic but worse since Jan 2024  SUBJECTIVE:                                                                                                                                                                                           SUBJECTIVE STATEMENT:  Patient reports feeling tired and not sure if getting sick.    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 She has received PT in the past for condition with positive results and currently using pain management strategies including pain meds, Heat, Exercises, Yoga, lidocain patch, diclofenac topical gel, and biofreeze. She was HTN controlled with medication.  She reports still lives alone but having trouble with community and social outings and having to modify how she performs her ADLs and housework due to her chronic pain. PMH- Spondylosis, Scoliosis, HTN, Lung CA.  PAIN:  Are you having pain? Yes 4/10 across low back, Right flank  PRECAUTIONS: Fall  WEIGHT BEARING RESTRICTIONS: No  FALLS:  Has patient fallen in last 6 months? No- However patient reports some balance issues and near falls- using cane with community outings  PLOF: Independent  PATIENT GOALS: To decrease my pain  NEXT MD VISIT: unknown   OBJECTIVE:   TODAY'S TREATMENT:  DATE:  12/19/22    BP= 149/56 mmHg  (sitting)  BP= 140/60 mmHg (after 20  min of therex/NMR in standing) -asymptomatic  THEREX:   Standing hip abd 3# AW at support bar -2 sets of 10 reps Sit to stand x 12 reps without UE Support. (Patient reports fatigued  yet no dizziness)    NMR:   Side stepping at support bar with 3# AW - x 4 steps each direction x 10 without UE Support  Side step up/over orange hurdle 3# AW - x 12 reps each direction without UE Support  Ladder drills - forward walk- reciprocal 3# AW - down and back x 3- Some initial LOB - reaching for UE support Ladder drills- step in/out to side with 3# AW- down and back x 3 (patient very fatigued with 3 episodes of LOB- requested water- 2 min recovery) Ladder drills- Side stepping 3# - down and back x 4 Ladder drills - Retro walk 3#- down and back x 3 (Step to gait)    PATIENT EDUCATION:  Education details: PT plan of care; Exercise technique  Person educated: Patient Education method: Explanation, Demonstration, Tactile cues, and Verbal cues Education comprehension: verbalized understanding, returned demonstration, verbal cues required, and tactile cues required  HOME EXERCISE PROGRAM: 11/14/22:  Side step c red band around knees, back against wall   Access Code: [email or web address] URL: [email or web address] Date: 10/13/2022 Prepared by: Maureen Ralphs  Exercises - Standing Quadratus Lumborum Stretch with Doorway  - 1 x daily - 7 x weekly - 3 sets - 20-30 sec hold - Standing Quadratus Lumborum Mobilization with Small Ball on Wall  - 1 x daily - 7 x weekly - 3 sets - 10 reps - Supine Quadratus Lumborum Stretch  - 1 x daily - 7 x weekly - 3 sets - 20-30 hold - 90/90 SI Joint Self-Correction with Dowel  - 1 x daily - 7 x weekly - 3 sets - 10 reps - Hooklying Isometric Hip Abduction with Belt  - 1 x daily - 7 x weekly - 3 sets - 10 reps - 5 hold    Access Code: [email or web address] URL: [email or web address] Date: 10/09/2022 Prepared by: Maureen Ralphs  Exercises - Standing Quadratus Lumborum Stretch with Doorway  - 1 x daily - 7 x weekly - 3 sets - 20-30 sec hold - Standing Quadratus Lumborum Mobilization with Small Ball on Wall  - 1 x daily - 7 x weekly -  3 sets - 10 reps - Supine Quadratus Lumborum Stretch  - 1 x daily - 7 x weekly - 3 sets - 20-30 hold  ASSESSMENT:  CLINICAL IMPRESSION: Patient presents with good motivation for today's session. She was able to continue with focus on LE strength/balance for improved mobility for remaining walking goals. She responded favorably to all activities without report of any increased Low back pain. Initially unsteady with most balance activities but improved with VC for posture and not looking down. Patient verbalizes good understanding of all low back stretching and mobilization and shift of focus from low back pain management to functional strength continues to evolve. Patient will benefit from skilled PT services to improve her low back pain and functional strength and decrease her risk of falling while improving her quality of life.    OBJECTIVE IMPAIRMENTS: Abnormal gait, decreased activity tolerance, decreased balance, decreased endurance, decreased mobility, difficulty walking, decreased strength, and pain.   ACTIVITY LIMITATIONS: carrying, lifting, bending, sitting, standing, squatting, sleeping, stairs, transfers, and bed mobility  PARTICIPATION LIMITATIONS:  cleaning, laundry, shopping, community activity, and yard work  PERSONAL FACTORS: Age, Time since onset of injury/illness/exacerbation, and 3+ comorbidities: scoliosis, HTN, degenerative spine  are also affecting patient's functional outcome.   REHAB POTENTIAL: Good  CLINICAL DECISION MAKING: Stable/uncomplicated  EVALUATION COMPLEXITY: Moderate   GOALS: Goals reviewed with patient? Yes  SHORT TERM GOALS: Target date: 10/24/2022  Pt will decrease worst back pain as reported on NPRS by at least 2 points in order to demonstrate clinically significant reduction in back pain.  Baseline: Eval= 8/10; 11/17/2022= 4/10 current and up to 7-8/10 at worst.  Goal status: MET  LONG TERM GOALS: Target date: 02/27/2023  Pt will be independent  with Final HEP in order to improve strength and decrease back pain in order to improve pain-free function at home and work.  Baseline: EVAL= Patient reports needs update and review of exercises to assist with her back pain; 11/17/2022- Patient performing mostly self stretching and yoga based exercises and will continue to benefit from adding to comprehensive HEP; 12/05/2022- HEP ongoing focusing more on Lumbar strengthening Goal status: ONGOING  2.  Pt will decrease worst back pain as reported on NPRS by at least 3 points in order to demonstrate clinically significant reduction in back pain.  Baseline: EVAL= 8/10; 11/17/2022= 4/10 current and up to 7-8/10 at worst. 12/05/2022= 4/10 current- and up to 6/10 depending on activity Goal status: ONGOING  3.  Pt will decrease mODI score by at least 13 points in order demonstrate clinically significant reduction in back pain/disability.   Baseline: EVAL=58%; 11/17/2022= 40%;12/05/2022= 44% Goal status: PROGRESSING  4.  Pt will improve FOTO to target score of >55 to display perceived improvements in ability to complete ADL's.  Baseline: EVAL= 54; 11/14/22: 56; 12/05/2022=63 Goal status: MET  5.  Patient (> 87 years old) will complete five times sit to stand test in <15 seconds indicating an increased LE strength and improved balance.  Baseline: EVAL = 20.33; 11/17/2022= 16.55 sec without UE Support; 12/05/2022= 16.47 sec without UE support Goal status: PROGRESSING  6.  Pt will increase by at least 0.13 m/s in order to demonstrate clinically significant improvement in community ambulation.   Baseline: EVAL = 0.97 m/s; 6/10=0.98 m/s; 12/05/2022= 0.98 m/s Goal status: PROGRESSING  7.  Patient will report ability to complete > or equal to 60 min grocery store outing or similar community activiities to return to her previous level of function.  Baseline: EVAL: patient reports difficulty tolerating outings in community. 12/05/2022= Patient reports up to around 40  min of continuous community activities.  Goal status: ONGOING   8. Pt will improve BERG by at least 3 points in order to demonstrate clinically significant improvement in balance.    Baseline: 09/30/2022= 47/56; 12/05/2022= 53/56  Goal status: MET  9. Pt will increase by at least 67m (177ft) in order to demonstrate clinically significant improvement in cardiopulmonary endurance and community ambulation   Baseline: 09/23/2022= use of SPC RUE (pt preference): starting pain 4/10, then 5/10 at 353ft, and 6/10 at 574ft: audible RR dyspnea. Test terminated after 634ft, 3 minutes 15 seconds; 11/17/2022= 920 feet in 5 min 37 sec c/o 4/10 LBP and some DOE- O2 sat unable to read due to cold fingers- Respirations= 23/min; 12/05/2022= 1050 feet in 6 min  Goal status: PROGRESSING PLAN:  PT FREQUENCY: 1-2x/week  PT DURATION: 12 weeks  PLANNED INTERVENTIONS: Therapeutic exercises, Therapeutic activity, Neuromuscular re-education, Balance training, Gait training, Patient/Family education, Self Care, Joint mobilization, Joint manipulation, Stair  training, Vestibular training, Canalith repositioning, Orthotic/Fit training, DME instructions, Dry Needling, Electrical stimulation, Spinal manipulation, Spinal mobilization, Cryotherapy, Moist heat, scar mobilization, Taping, and Manual therapy.  PLAN FOR NEXT SESSION: Continue to monitor vitals to add data for BP values. Functional LE strengthening and core strength, dynamic balance training.    11:06 AM, 12/19/22    Lenda Kelp, PT 12/19/2022, 11:06 AM   11:06 AM, 12/19/22 Physical Therapist - [facility]  [facility]- [facility] [phone]    11:06 AM, 12/19/22

## 2022-12-22 ENCOUNTER — Ambulatory Visit: Payer: Medicare HMO

## 2022-12-24 ENCOUNTER — Ambulatory Visit: Payer: Medicare HMO

## 2022-12-26 ENCOUNTER — Ambulatory Visit: Payer: Medicare HMO

## 2022-12-29 ENCOUNTER — Ambulatory Visit: Payer: Medicare HMO

## 2022-12-31 ENCOUNTER — Ambulatory Visit: Payer: Medicare HMO

## 2023-01-02 ENCOUNTER — Ambulatory Visit: Payer: Medicare HMO

## 2023-01-02 DIAGNOSIS — R278 Other lack of coordination: Secondary | ICD-10-CM

## 2023-01-02 DIAGNOSIS — M6281 Muscle weakness (generalized): Secondary | ICD-10-CM

## 2023-01-02 DIAGNOSIS — R269 Unspecified abnormalities of gait and mobility: Secondary | ICD-10-CM | POA: Diagnosis not present

## 2023-01-02 DIAGNOSIS — R262 Difficulty in walking, not elsewhere classified: Secondary | ICD-10-CM

## 2023-01-02 NOTE — Therapy (Signed)
OUTPATIENT PHYSICAL THERAPY TREATMENT  Patient Name: Kendra Benton MRN: [id number] DOB:09-17-1940, 82 y.o., female Today's Date: 01/02/2023  END OF SESSION:  PT End of Session - 01/02/23 1045     Visit Number 18    Number of Visits 24    Date for PT Re-Evaluation 02/27/23    Authorization Type [employer] primary; [place] medicaid secondary    Authorization Time Period 09/11/22-12/04/22    Progress Note Due on Visit 20    PT Start Time 1025    PT Stop Time 1055    PT Time Calculation (min) 30 min    Equipment Utilized During Treatment Gait belt    Activity Tolerance Patient tolerated treatment well;No increased pain    Behavior During Therapy WFL for tasks assessed/performed                  Past Medical History:  Diagnosis Date   Allergy    Anxiety    GERD (gastroesophageal reflux disease)    Headache    History of kidney stones    Hypertension    Myocardial infarction (HCC)    1997   Osteoporosis    Pneumonia    Past Surgical History:  Procedure Laterality Date   ABDOMINAL HYSTERECTOMY     patient has ovaries   APPENDECTOMY     ARTERY BIOPSY Right 12/30/2017   Procedure: BIOPSY TEMPORAL ARTERY;  Surgeon: Renford Dills, MD;  Location: [facility];  Service: Vascular;  Laterality: Right;   ARTERY BIOPSY Left 06/18/2018   Procedure: BIOPSY TEMPORAL ARTERY;  Surgeon: Renford Dills, MD;  Location: [facility];  Service: Vascular;  Laterality: Left;   BACK SURGERY     CARDIAC CATHETERIZATION     CATARACT EXTRACTION W/ INTRAOCULAR LENS  IMPLANT, BILATERAL Bilateral 2010   CHOLECYSTECTOMY N/A 02/28/2016   Procedure: LAPAROSCOPIC CHOLECYSTECTOMY WITH INTRAOPERATIVE CHOLANGIOGRAM;  Surgeon: Tiney Rouge III, MD;  Location: [facility];  Service: General;  Laterality: N/A;   LUMBAR FUSION  11/09/2016   L3-L5   SPINE SURGERY     Disc   TONSILLECTOMY     Patient Active Problem List   Diagnosis Date Noted   Fall (on) (from) other stairs and steps, initial encounter  08/06/2020   Lumbar facet joint syndrome 06/18/2020   Spondylosis without myelopathy or radiculopathy, lumbosacral region 06/18/2020   DDD (degenerative disc disease), lumbosacral 06/18/2020   COVID 06/04/2020   Chronic pain syndrome 05/16/2020   Pharmacologic therapy 05/16/2020   Disorder of skeletal system 05/16/2020   Problems influencing health status 05/16/2020   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   Chronic hip pain (2ry area of Pain) (Bilateral) (R>L) 05/16/2020   Chronic lower extremity pain (Left) 05/16/2020   Numbness and tingling of both feet (intermittent/recurrent) 05/16/2020   Pain in rib (Bilateral) (R>L) (intermittent) 05/16/2020   Chronic knee pain (Left) 05/16/2020   Patellar pain (Left) (chronic, intermittent) 05/16/2020   Hard of hearing 05/16/2020   Carcinoma, lung, 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/25/2019   S/P partial lobectomy of lung 02/25/2019   Mass of upper lobe of right lung 01/06/2019   Chronic nonintractable headache 06/14/2018   Head ache 12/03/2017   Sinus bradycardia 10/06/2017   Chronic low back pain (1ry area of Pain) (Bilateral) (L>R) w/o sciatica 08/17/2017   Panic attack as reaction to stress 08/17/2017   Anxiety 06/15/2017   Atypical chest pain 06/15/2017   Hyperlipidemia 06/15/2017   Cholecystitis with cholelithiasis 02/27/2016   Increased frequency of urination 01/27/2015   History  of night sweats 05/01/2014   Dupuytren's contracture 07/18/2013   Inverted nipple 04/18/2013   Depression 04/23/2010   Fibromyalgia 04/23/2010   Hypercholesterolemia 04/23/2010   Essential hypertension 04/23/2010   Hypothyroidism 04/23/2010   Irritable colon 04/23/2010   Osteoarthritis 04/23/2010    PCP: Cain Sieve, MD  REFERRING PROVIDER: Cain Sieve, MD  REFERRING DIAG: Chronic low back pain; At risk for falls  Rationale for Evaluation and Treatment: Rehabilitation  THERAPY DIAG:  Abnormality of gait and mobility  Difficulty in  walking, not elsewhere classified  Muscle weakness (generalized)  Other lack of coordination  ONSET DATE: chronic but worse since Jan 2024  SUBJECTIVE:                                                                                                                                                                                           SUBJECTIVE STATEMENT:  Pt back after diverticulitis flare, still feeling a little weak, intermittently crampy.    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 She has received PT in the past for condition with positive results and currently using pain management strategies including pain meds, Heat, Exercises, Yoga, lidocain patch, diclofenac topical gel, and biofreeze. She was HTN controlled with medication.  She reports still lives alone but having trouble with community and social outings and having to modify how she performs her ADLs and housework due to her chronic pain. PMH- Spondylosis, Scoliosis, HTN, Lung CA.  PAIN:  Are you having pain? Yes 4/10 across low back, Right flank  PRECAUTIONS: Fall  WEIGHT BEARING RESTRICTIONS: No  FALLS:  Has patient fallen in last 6 months? No- However patient reports some balance issues and near falls- using cane with community outings  PLOF: Independent  PATIENT GOALS: To decrease my pain  NEXT MD VISIT: unknown   OBJECTIVE:   TODAY'S TREATMENT:  DATE:  01/02/23    -Vitals: 158/48mmHg 75bpm (per pt has been high since illness, 170s SBP at PCP last week)   -Newstep seat 5, arms 9 x5 minutes level 2  -158/65 mmHg  75 bpm -side stepping in // bars yellow TB a tknees resistance 4x bilat  -AMB in //bars yellow TB resistance at knees: high knee march forward, big step backwards;  -STS from  chair x10 hands free -tandem on balance beam 5x20sec bilat alternating sides  -balance beam walking x2, hands free when able   PATIENT EDUCATION:  Education details: PT plan of care; Exercise technique  Person educated: Patient Education method: Explanation, Demonstration, Tactile cues, and Verbal cues Education comprehension: verbalized understanding, returned demonstration, verbal cues required, and tactile cues required  HOME EXERCISE PROGRAM: 11/14/22:  Side step c red band around knees, back against wall   Access Code: [email or web address] URL: [email or web address] Date: 10/13/2022 Prepared by: Maureen Ralphs  Exercises - Standing Quadratus Lumborum Stretch with Doorway  - 1 x daily - 7 x weekly - 3 sets - 20-30 sec hold - Standing Quadratus Lumborum Mobilization with Small Ball on Wall  - 1 x daily - 7 x weekly - 3 sets - 10 reps - Supine Quadratus Lumborum Stretch  - 1 x daily - 7 x weekly - 3 sets - 20-30 hold - 90/90 SI Joint Self-Correction with Dowel  - 1 x daily - 7 x weekly - 3 sets - 10 reps - Hooklying Isometric Hip Abduction with Belt  - 1 x daily - 7 x weekly - 3 sets - 10 reps - 5 hold    Access Code: [email or web address] URL: [email or web address] Date: 10/09/2022 Prepared by: Maureen Ralphs  Exercises - Standing Quadratus Lumborum Stretch with Doorway  - 1 x daily - 7 x weekly - 3 sets - 20-30 sec hold - Standing Quadratus Lumborum Mobilization with Small Ball on Wall  - 1 x daily - 7 x weekly - 3 sets - 10 reps - Supine Quadratus Lumborum Stretch  - 1 x daily - 7 x weekly - 3 sets - 20-30 hold  ASSESSMENT:  CLINICAL IMPRESSION: Pt back after absence with acute GI illness. Pt still weak feeling, asks to adjust session accordingly. Conitnued with moderate level core strength interventions and motor control interventions, albeit with reduced intensity, compared to prior session. Pain stable throughout. Patient will benefit from skilled PT  services to improve her low back pain and functional strength and decrease her risk of falling while improving her quality of life.    OBJECTIVE IMPAIRMENTS: Abnormal gait, decreased activity tolerance, decreased balance, decreased endurance, decreased mobility, difficulty walking, decreased strength, and pain.   ACTIVITY LIMITATIONS: carrying, lifting, bending, sitting, standing, squatting, sleeping, stairs, transfers, and bed mobility  PARTICIPATION LIMITATIONS: cleaning, laundry, shopping, community activity, and yard work  PERSONAL FACTORS: Age, Time since onset of injury/illness/exacerbation, and 3+ comorbidities: scoliosis, HTN, degenerative spine  are also affecting patient's functional outcome.   REHAB POTENTIAL: Good  CLINICAL DECISION MAKING: Stable/uncomplicated  EVALUATION COMPLEXITY: Moderate   GOALS: Goals reviewed with patient? Yes  SHORT TERM GOALS: Target date: 10/24/2022  Pt will decrease worst back pain as reported on NPRS by at least 2 points in order to demonstrate clinically significant reduction in back pain.  Baseline: Eval= 8/10; 11/17/2022= 4/10 current and up to 7-8/10 at worst.  Goal status: MET  LONG TERM GOALS: Target date: 02/27/2023  Pt will be independent with Final HEP in order to improve strength and decrease  back pain in order to improve pain-free function at home and work.  Baseline: EVAL= Patient reports needs update and review of exercises to assist with her back pain; 11/17/2022- Patient performing mostly self stretching and yoga based exercises and will continue to benefit from adding to comprehensive HEP; 12/05/2022- HEP ongoing focusing more on Lumbar strengthening Goal status: ONGOING  2.  Pt will decrease worst back pain as reported on NPRS by at least 3 points in order to demonstrate clinically significant reduction in back pain.  Baseline: EVAL= 8/10; 11/17/2022= 4/10 current and up to 7-8/10 at worst. 12/05/2022= 4/10 current- and up to 6/10  depending on activity Goal status: ONGOING  3.  Pt will decrease mODI score by at least 13 points in order demonstrate clinically significant reduction in back pain/disability.   Baseline: EVAL=58%; 11/17/2022= 40%;12/05/2022= 44% Goal status: PROGRESSING  4.  Pt will improve FOTO to target score of >55 to display perceived improvements in ability to complete ADL's.  Baseline: EVAL= 54; 11/14/22: 56; 12/05/2022=63 Goal status: MET  5.  Patient (> 92 years old) will complete five times sit to stand test in <15 seconds indicating an increased LE strength and improved balance.  Baseline: EVAL = 20.33; 11/17/2022= 16.55 sec without UE Support; 12/05/2022= 16.47 sec without UE support Goal status: PROGRESSING  6.  Pt will increase by at least 0.13 m/s in order to demonstrate clinically significant improvement in community ambulation.   Baseline: EVAL = 0.97 m/s; 6/10=0.98 m/s; 12/05/2022= 0.98 m/s Goal status: PROGRESSING  7.  Patient will report ability to complete > or equal to 60 min grocery store outing or similar community activiities to return to her previous level of function.  Baseline: EVAL: patient reports difficulty tolerating outings in community. 12/05/2022= Patient reports up to around 40 min of continuous community activities.  Goal status: ONGOING   8. Pt will improve BERG by at least 3 points in order to demonstrate clinically significant improvement in balance.    Baseline: 09/30/2022= 47/56; 12/05/2022= 53/56  Goal status: MET  9. Pt will increase by at least 50m (126ft) in order to demonstrate clinically significant improvement in cardiopulmonary endurance and community ambulation   Baseline: 09/23/2022= use of SPC RUE (pt preference): starting pain 4/10, then 5/10 at 39ft, and 6/10 at 580ft: audible RR dyspnea. Test terminated after 686ft, 3 minutes 15 seconds; 11/17/2022= 920 feet in 5 min 37 sec c/o 4/10 LBP and some DOE- O2 sat unable to read due to cold fingers-  Respirations= 23/min; 12/05/2022= 1050 feet in 6 min  Goal status: PROGRESSING PLAN:  PT FREQUENCY: 1-2x/week  PT DURATION: 12 weeks  PLANNED INTERVENTIONS: Therapeutic exercises, Therapeutic activity, Neuromuscular re-education, Balance training, Gait training, Patient/Family education, Self Care, Joint mobilization, Joint manipulation, Stair training, Vestibular training, Canalith repositioning, Orthotic/Fit training, DME instructions, Dry Needling, Electrical stimulation, Spinal manipulation, Spinal mobilization, Cryotherapy, Moist heat, scar mobilization, Taping, and Manual therapy.  PLAN FOR NEXT SESSION: Continue to monitor vitals to add data for BP values. Functional LE strengthening and core strength, dynamic balance training.    10:46 AM, 01/02/23    Matthew Pais C, PT 01/02/2023, 10:46 AM   10:46 AM, 01/02/23 Physical Therapist - [facility]  Outpatient Physical Therapy- [facility] [phone]    10:46 AM, 01/02/23

## 2023-01-05 ENCOUNTER — Ambulatory Visit: Payer: Medicare HMO

## 2023-01-07 ENCOUNTER — Ambulatory Visit: Payer: Medicare HMO | Admitting: Physical Therapy

## 2023-01-07 DIAGNOSIS — R269 Unspecified abnormalities of gait and mobility: Secondary | ICD-10-CM

## 2023-01-07 DIAGNOSIS — R262 Difficulty in walking, not elsewhere classified: Secondary | ICD-10-CM

## 2023-01-07 DIAGNOSIS — M6281 Muscle weakness (generalized): Secondary | ICD-10-CM

## 2023-01-07 DIAGNOSIS — R2689 Other abnormalities of gait and mobility: Secondary | ICD-10-CM

## 2023-01-07 DIAGNOSIS — G8929 Other chronic pain: Secondary | ICD-10-CM

## 2023-01-07 NOTE — Therapy (Signed)
OUTPATIENT PHYSICAL THERAPY TREATMENT  Patient Name: Kendra Benton MRN: [id number] DOB:1940-08-26, 82 y.o., female Today's Date: 01/07/2023  END OF SESSION:  PT End of Session - 01/07/23 1611     Visit Number 19    Number of Visits 24    Date for PT Re-Evaluation 02/27/23    Authorization Type [employer] primary; [place] medicaid secondary    Authorization Time Period 09/11/22-12/04/22    Progress Note Due on Visit 20    PT Start Time 1615    PT Stop Time 1700    PT Time Calculation (min) 45 min    Equipment Utilized During Treatment Gait belt    Activity Tolerance Patient tolerated treatment well;No increased pain    Behavior During Therapy WFL for tasks assessed/performed                   Past Medical History:  Diagnosis Date   Allergy    Anxiety    GERD (gastroesophageal reflux disease)    Headache    History of kidney stones    Hypertension    Myocardial infarction (HCC)    1997   Osteoporosis    Pneumonia    Past Surgical History:  Procedure Laterality Date   ABDOMINAL HYSTERECTOMY     patient has ovaries   APPENDECTOMY     ARTERY BIOPSY Right 12/30/2017   Procedure: BIOPSY TEMPORAL ARTERY;  Surgeon: Renford Dills, MD;  Location: [facility];  Service: Vascular;  Laterality: Right;   ARTERY BIOPSY Left 06/18/2018   Procedure: BIOPSY TEMPORAL ARTERY;  Surgeon: Renford Dills, MD;  Location: [facility];  Service: Vascular;  Laterality: Left;   BACK SURGERY     CARDIAC CATHETERIZATION     CATARACT EXTRACTION W/ INTRAOCULAR LENS  IMPLANT, BILATERAL Bilateral 2010   CHOLECYSTECTOMY N/A 02/28/2016   Procedure: LAPAROSCOPIC CHOLECYSTECTOMY WITH INTRAOPERATIVE CHOLANGIOGRAM;  Surgeon: Tiney Rouge III, MD;  Location: [facility];  Service: General;  Laterality: N/A;   LUMBAR FUSION  11/09/2016   L3-L5   SPINE SURGERY     Disc   TONSILLECTOMY     Patient Active Problem List   Diagnosis Date Noted   Fall (on) (from) other stairs and steps, initial encounter  08/06/2020   Lumbar facet joint syndrome 06/18/2020   Spondylosis without myelopathy or radiculopathy, lumbosacral region 06/18/2020   DDD (degenerative disc disease), lumbosacral 06/18/2020   COVID 06/04/2020   Chronic pain syndrome 05/16/2020   Pharmacologic therapy 05/16/2020   Disorder of skeletal system 05/16/2020   Problems influencing health status 05/16/2020   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   Chronic hip pain (2ry area of Pain) (Bilateral) (R>L) 05/16/2020   Chronic lower extremity pain (Left) 05/16/2020   Numbness and tingling of both feet (intermittent/recurrent) 05/16/2020   Pain in rib (Bilateral) (R>L) (intermittent) 05/16/2020   Chronic knee pain (Left) 05/16/2020   Patellar pain (Left) (chronic, intermittent) 05/16/2020   Hard of hearing 05/16/2020   Carcinoma, lung, 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/25/2019   S/P partial lobectomy of lung 02/25/2019   Mass of upper lobe of right lung 01/06/2019   Chronic nonintractable headache 06/14/2018   Head ache 12/03/2017   Sinus bradycardia 10/06/2017   Chronic low back pain (1ry area of Pain) (Bilateral) (L>R) w/o sciatica 08/17/2017   Panic attack as reaction to stress 08/17/2017   Anxiety 06/15/2017   Atypical chest pain 06/15/2017   Hyperlipidemia 06/15/2017   Cholecystitis with cholelithiasis 02/27/2016   Increased frequency of urination 01/27/2015  History of night sweats 05/01/2014   Dupuytren's contracture 07/18/2013   Inverted nipple 04/18/2013   Depression 04/23/2010   Fibromyalgia 04/23/2010   Hypercholesterolemia 04/23/2010   Essential hypertension 04/23/2010   Hypothyroidism 04/23/2010   Irritable colon 04/23/2010   Osteoarthritis 04/23/2010    PCP: Cain Sieve, MD  REFERRING PROVIDER: Cain Sieve, MD  REFERRING DIAG: Chronic low back pain; At risk for falls  Rationale for Evaluation and Treatment: Rehabilitation  THERAPY DIAG:  Abnormality of gait and mobility  Difficulty in  walking, not elsewhere classified  Other abnormalities of gait and mobility  Chronic bilateral low back pain without sciatica  Muscle weakness (generalized)  ONSET DATE: chronic but worse since Jan 2024  SUBJECTIVE:                                                                                                                                                                                           SUBJECTIVE STATEMENT:  Pt reports back feeling painful today, ran out of advil and has not had time to get more. Pt feels she lost some ground with her LBP when she was sick and in bed a lot.    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 She has received PT in the past for condition with positive results and currently using pain management strategies including pain meds, Heat, Exercises, Yoga, lidocain patch, diclofenac topical gel, and biofreeze. She was HTN controlled with medication.  She reports still lives alone but having trouble with community and social outings and having to modify how she performs her ADLs and housework due to her chronic pain. PMH- Spondylosis, Scoliosis, HTN, Lung CA.  PAIN:  Are you having pain? Yes 6/10 across low back, midline  PRECAUTIONS: Fall  WEIGHT BEARING RESTRICTIONS: No  FALLS:  Has patient fallen in last 6 months? No- However patient reports some balance issues and near falls- using cane with community outings  PLOF: Independent  PATIENT GOALS: To decrease my pain  NEXT MD VISIT: unknown   OBJECTIVE:   TODAY'S TREATMENT:  DATE:  01/07/23    BP assessed: 147/61  Self stretching:  LTR x 10 each way Single knee to chest x 10 each Piriformis- hold 30 sec x 3 ea side  Hip circles- 10 each direction (cw and ccw)   Supine  bridging with Hip abd in hooklye using GTB 2 x 10 reps   Sidelye Clamshell RTB 2 x 10 each LE Reverse clam in sidelying x 10 ea LE, increased ROM on R > L Bird dog x 3 sec holds x 5 reps ea side  Sidelye plank raise x 8 sec intervals x 3 rounds ea side   Had pt stand to assess back pain, midline back pain improved but still has pain in low back and radiating laterally, addressed with TP release as below  Manual:   TP release in bilateral gluteal musculature, ischemic trigger point release used  End of session pt report feeling better and stating" I think you really helped me today"    PATIENT EDUCATION:  Education details: PT plan of care; Exercise technique  Person educated: Patient Education method: Explanation, Demonstration, Tactile cues, and Verbal cues Education comprehension: verbalized understanding, returned demonstration, verbal cues required, and tactile cues required  HOME EXERCISE PROGRAM: 11/14/22:  Side step c red band around knees, back against wall   Access Code: [email or web address] URL: [email or web address] Date: 10/13/2022 Prepared by: Maureen Ralphs  Exercises - Standing Quadratus Lumborum Stretch with Doorway  - 1 x daily - 7 x weekly - 3 sets - 20-30 sec hold - Standing Quadratus Lumborum Mobilization with Small Ball on Wall  - 1 x daily - 7 x weekly - 3 sets - 10 reps - Supine Quadratus Lumborum Stretch  - 1 x daily - 7 x weekly - 3 sets - 20-30 hold - 90/90 SI Joint Self-Correction with Dowel  - 1 x daily - 7 x weekly - 3 sets - 10 reps - Hooklying Isometric Hip Abduction with Belt  - 1 x daily - 7 x weekly - 3 sets - 10 reps - 5 hold    Access Code: [email or web address] URL: [email or web address] Date: 10/09/2022 Prepared by: Maureen Ralphs  Exercises - Standing Quadratus Lumborum Stretch with Doorway  - 1 x daily - 7 x weekly - 3 sets - 20-30 sec hold - Standing Quadratus Lumborum Mobilization with Small Ball on Wall  - 1 x daily - 7  x weekly - 3 sets - 10 reps - Supine Quadratus Lumborum Stretch  - 1 x daily - 7 x weekly - 3 sets - 20-30 hold  ASSESSMENT:  CLINICAL IMPRESSION: Pt presents with increased pain this date. Focussed on low back pain and core strengthening with some manual therapy at end of session. Pt had difficulty with higher level core strengthening interventions and will benefit from continuation with this as well as continuing with balance interventions to improve her stability. Pt will continue to benefit from skilled physical therapy intervention to address impairments, improve QOL, and attain therapy goals.     OBJECTIVE IMPAIRMENTS: Abnormal gait, decreased activity tolerance, decreased balance, decreased endurance, decreased mobility, difficulty walking, decreased strength, and pain.   ACTIVITY LIMITATIONS: carrying, lifting, bending, sitting, standing, squatting, sleeping, stairs, transfers, and bed mobility  PARTICIPATION LIMITATIONS: cleaning, laundry, shopping, community activity, and yard work  PERSONAL FACTORS: Age, Time since onset of injury/illness/exacerbation, and 3+ comorbidities: scoliosis, HTN, degenerative spine  are also affecting patient's functional outcome.   REHAB POTENTIAL: Good  CLINICAL DECISION MAKING: Stable/uncomplicated  EVALUATION COMPLEXITY: Moderate  GOALS: Goals reviewed with patient? Yes  SHORT TERM GOALS: Target date: 10/24/2022  Pt will decrease worst back pain as reported on NPRS by at least 2 points in order to demonstrate clinically significant reduction in back pain.  Baseline: Eval= 8/10; 11/17/2022= 4/10 current and up to 7-8/10 at worst.  Goal status: MET  LONG TERM GOALS: Target date: 02/27/2023  Pt will be independent with Final HEP in order to improve strength and decrease back pain in order to improve pain-free function at home and work.  Baseline: EVAL= Patient reports needs update and review of exercises to assist with her back pain;  11/17/2022- Patient performing mostly self stretching and yoga based exercises and will continue to benefit from adding to comprehensive HEP; 12/05/2022- HEP ongoing focusing more on Lumbar strengthening Goal status: ONGOING  2.  Pt will decrease worst back pain as reported on NPRS by at least 3 points in order to demonstrate clinically significant reduction in back pain.  Baseline: EVAL= 8/10; 11/17/2022= 4/10 current and up to 7-8/10 at worst. 12/05/2022= 4/10 current- and up to 6/10 depending on activity Goal status: ONGOING  3.  Pt will decrease mODI score by at least 13 points in order demonstrate clinically significant reduction in back pain/disability.   Baseline: EVAL=58%; 11/17/2022= 40%;12/05/2022= 44% Goal status: PROGRESSING  4.  Pt will improve FOTO to target score of >55 to display perceived improvements in ability to complete ADL's.  Baseline: EVAL= 54; 11/14/22: 56; 12/05/2022=63 Goal status: MET  5.  Patient (> 60 years old) will complete five times sit to stand test in <15 seconds indicating an increased LE strength and improved balance.  Baseline: EVAL = 20.33; 11/17/2022= 16.55 sec without UE Support; 12/05/2022= 16.47 sec without UE support Goal status: PROGRESSING  6.  Pt will increase by at least 0.13 m/s in order to demonstrate clinically significant improvement in community ambulation.   Baseline: EVAL = 0.97 m/s; 6/10=0.98 m/s; 12/05/2022= 0.98 m/s Goal status: PROGRESSING  7.  Patient will report ability to complete > or equal to 60 min grocery store outing or similar community activiities to return to her previous level of function.  Baseline: EVAL: patient reports difficulty tolerating outings in community. 12/05/2022= Patient reports up to around 40 min of continuous community activities.  Goal status: ONGOING   8. Pt will improve BERG by at least 3 points in order to demonstrate clinically significant improvement in balance.    Baseline: 09/30/2022= 47/56;  12/05/2022= 53/56  Goal status: MET  9. Pt will increase by at least 51m (169ft) in order to demonstrate clinically significant improvement in cardiopulmonary endurance and community ambulation   Baseline: 09/23/2022= use of SPC RUE (pt preference): starting pain 4/10, then 5/10 at 370ft, and 6/10 at 514ft: audible RR dyspnea. Test terminated after 632ft, 3 minutes 15 seconds; 11/17/2022= 920 feet in 5 min 37 sec c/o 4/10 LBP and some DOE- O2 sat unable to read due to cold fingers- Respirations= 23/min; 12/05/2022= 1050 feet in 6 min  Goal status: PROGRESSING PLAN:  PT FREQUENCY: 1-2x/week  PT DURATION: 12 weeks  PLANNED INTERVENTIONS: Therapeutic exercises, Therapeutic activity, Neuromuscular re-education, Balance training, Gait training, Patient/Family education, Self Care, Joint mobilization, Joint manipulation, Stair training, Vestibular training, Canalith repositioning, Orthotic/Fit training, DME instructions, Dry Needling, Electrical stimulation, Spinal manipulation, Spinal mobilization, Cryotherapy, Moist heat, scar mobilization, Taping, and Manual therapy.  PLAN FOR NEXT SESSION: Continue to monitor vitals to add data for BP values. Functional LE strengthening and core strength,  dynamic balance training.    4:18 PM, 01/07/23    Norman Herrlich, PT 01/07/2023, 4:18 PM   4:18 PM, 01/07/23 Physical Therapist - [facility]  Outpatient Physical Therapy- Main Campus [phone]    4:18 PM, 01/07/23

## 2023-01-12 ENCOUNTER — Encounter: Payer: Self-pay | Admitting: Physical Therapy

## 2023-01-12 ENCOUNTER — Ambulatory Visit: Payer: Medicare HMO | Attending: Internal Medicine | Admitting: Physical Therapy

## 2023-01-12 DIAGNOSIS — R278 Other lack of coordination: Secondary | ICD-10-CM | POA: Insufficient documentation

## 2023-01-12 DIAGNOSIS — R262 Difficulty in walking, not elsewhere classified: Secondary | ICD-10-CM | POA: Diagnosis present

## 2023-01-12 DIAGNOSIS — G8929 Other chronic pain: Secondary | ICD-10-CM | POA: Insufficient documentation

## 2023-01-12 DIAGNOSIS — M6281 Muscle weakness (generalized): Secondary | ICD-10-CM | POA: Insufficient documentation

## 2023-01-12 DIAGNOSIS — R269 Unspecified abnormalities of gait and mobility: Secondary | ICD-10-CM | POA: Insufficient documentation

## 2023-01-12 DIAGNOSIS — R2681 Unsteadiness on feet: Secondary | ICD-10-CM | POA: Insufficient documentation

## 2023-01-12 DIAGNOSIS — M545 Low back pain, unspecified: Secondary | ICD-10-CM | POA: Diagnosis present

## 2023-01-12 DIAGNOSIS — R2689 Other abnormalities of gait and mobility: Secondary | ICD-10-CM | POA: Diagnosis present

## 2023-01-12 NOTE — Therapy (Signed)
OUTPATIENT PHYSICAL THERAPY TREATMENT/ Physical Therapy Progress Note   Dates of reporting period  11/14/22   to   01/12/23   Patient Name: Kendra Benton MRN: [id number] DOB:Mar 20, 1941, 82 y.o., female Today's Date: 01/12/2023  END OF SESSION:  PT End of Session - 01/12/23 1606     Visit Number 20    Number of Visits 24    Date for PT Re-Evaluation 02/27/23    Authorization Type [employer] primary; [place] medicaid secondary    Authorization Time Period 09/11/22-12/04/22    Progress Note Due on Visit 30    PT Start Time 1610    PT Stop Time 1652    PT Time Calculation (min) 42 min    Equipment Utilized During Treatment Gait belt    Activity Tolerance Patient tolerated treatment well;No increased pain    Behavior During Therapy WFL for tasks assessed/performed                    Past Medical History:  Diagnosis Date   Allergy    Anxiety    GERD (gastroesophageal reflux disease)    Headache    History of kidney stones    Hypertension    Myocardial infarction (HCC)    1997   Osteoporosis    Pneumonia    Past Surgical History:  Procedure Laterality Date   ABDOMINAL HYSTERECTOMY     patient has ovaries   APPENDECTOMY     ARTERY BIOPSY Right 12/30/2017   Procedure: BIOPSY TEMPORAL ARTERY;  Surgeon: Renford Dills, MD;  Location: [facility];  Service: Vascular;  Laterality: Right;   ARTERY BIOPSY Left 06/18/2018   Procedure: BIOPSY TEMPORAL ARTERY;  Surgeon: Renford Dills, MD;  Location: [facility];  Service: Vascular;  Laterality: Left;   BACK SURGERY     CARDIAC CATHETERIZATION     CATARACT EXTRACTION W/ INTRAOCULAR LENS  IMPLANT, BILATERAL Bilateral 2010   CHOLECYSTECTOMY N/A 02/28/2016   Procedure: LAPAROSCOPIC CHOLECYSTECTOMY WITH INTRAOPERATIVE CHOLANGIOGRAM;  Surgeon: Tiney Rouge III, MD;  Location: [facility];  Service: General;  Laterality: N/A;   LUMBAR FUSION  11/09/2016   L3-L5   SPINE SURGERY     Disc   TONSILLECTOMY     Patient Active Problem List    Diagnosis Date Noted   Fall (on) (from) other stairs and steps, initial encounter 08/06/2020   Lumbar facet joint syndrome 06/18/2020   Spondylosis without myelopathy or radiculopathy, lumbosacral region 06/18/2020   DDD (degenerative disc disease), lumbosacral 06/18/2020   COVID 06/04/2020   Chronic pain syndrome 05/16/2020   Pharmacologic therapy 05/16/2020   Disorder of skeletal system 05/16/2020   Problems influencing health status 05/16/2020   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   Chronic hip pain (2ry area of Pain) (Bilateral) (R>L) 05/16/2020   Chronic lower extremity pain (Left) 05/16/2020   Numbness and tingling of both feet (intermittent/recurrent) 05/16/2020   Pain in rib (Bilateral) (R>L) (intermittent) 05/16/2020   Chronic knee pain (Left) 05/16/2020   Patellar pain (Left) (chronic, intermittent) 05/16/2020   Hard of hearing 05/16/2020   Carcinoma, lung, 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/25/2019   S/P partial lobectomy of lung 02/25/2019   Mass of upper lobe of right lung 01/06/2019   Chronic nonintractable headache 06/14/2018   Head ache 12/03/2017   Sinus bradycardia 10/06/2017   Chronic low back pain (1ry area of Pain) (Bilateral) (L>R) w/o sciatica 08/17/2017   Panic attack as reaction to stress 08/17/2017   Anxiety 06/15/2017   Atypical chest pain  06/15/2017   Hyperlipidemia 06/15/2017   Cholecystitis with cholelithiasis 02/27/2016   Increased frequency of urination 01/27/2015   History of night sweats 05/01/2014   Dupuytren's contracture 07/18/2013   Inverted nipple 04/18/2013   Depression 04/23/2010   Fibromyalgia 04/23/2010   Hypercholesterolemia 04/23/2010   Essential hypertension 04/23/2010   Hypothyroidism 04/23/2010   Irritable colon 04/23/2010   Osteoarthritis 04/23/2010    PCP: Cain Sieve, MD  REFERRING PROVIDER: Cain Sieve, MD  REFERRING DIAG: Chronic low back pain; At risk for falls  Rationale for Evaluation and Treatment:  Rehabilitation  THERAPY DIAG:  Abnormality of gait and mobility  Difficulty in walking, not elsewhere classified  Other abnormalities of gait and mobility  Chronic bilateral low back pain without sciatica  ONSET DATE: chronic but worse since Jan 2024  SUBJECTIVE:                                                                                                                                                                                           SUBJECTIVE STATEMENT:  Pt reports back feeling better today. Still feels like therapy is helping tremendously. States she feels she needs to work on her exercises more.    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 She has received PT in the past for condition with positive results and currently using pain management strategies including pain meds, Heat, Exercises, Yoga, lidocain patch, diclofenac topical gel, and biofreeze. She was HTN controlled with medication.  She reports still lives alone but having trouble with community and social outings and having to modify how she performs her ADLs and housework due to her chronic pain. PMH- Spondylosis, Scoliosis, HTN, Lung CA.  PAIN:  Are you having pain? Yes 6/10 across low back, midline  PRECAUTIONS: Fall  WEIGHT BEARING RESTRICTIONS: No  FALLS:  Has patient fallen in last 6 months? No- However patient reports some balance issues and near falls- using cane with community outings  PLOF: Independent  PATIENT GOALS: To decrease my pain  NEXT MD VISIT: unknown   OBJECTIVE:   TODAY'S TREATMENT:  DATE:  01/12/23    Physical therapy treatment session today consisted of completing assessment of goals and administration of testing as demonstrated and documented in flow sheet,  treatment, and goals section of this note. Addition treatments may be found below.   TE: leg press 2 x 10 with 40# resistance  Seated LAQ x 10 with 5# AW  March with step 10 x 5# AW    PATIENT EDUCATION:  Education details: PT plan of care; Exercise technique  Person educated: Patient Education method: Explanation, Demonstration, Tactile cues, and Verbal cues Education comprehension: verbalized understanding, returned demonstration, verbal cues required, and tactile cues required  HOME EXERCISE PROGRAM: 11/14/22:  Side step c red band around knees, back against wall   Access Code: [email or web address] URL: [email or web address] Date: 10/13/2022 Prepared by: Maureen Ralphs  Exercises - Standing Quadratus Lumborum Stretch with Doorway  - 1 x daily - 7 x weekly - 3 sets - 20-30 sec hold - Standing Quadratus Lumborum Mobilization with Small Ball on Wall  - 1 x daily - 7 x weekly - 3 sets - 10 reps - Supine Quadratus Lumborum Stretch  - 1 x daily - 7 x weekly - 3 sets - 20-30 hold - 90/90 SI Joint Self-Correction with Dowel  - 1 x daily - 7 x weekly - 3 sets - 10 reps - Hooklying Isometric Hip Abduction with Belt  - 1 x daily - 7 x weekly - 3 sets - 10 reps - 5 hold    Access Code: [email or web address] URL: [email or web address] Date: 10/09/2022 Prepared by: Maureen Ralphs  Exercises - Standing Quadratus Lumborum Stretch with Doorway  - 1 x daily - 7 x weekly - 3 sets - 20-30 sec hold - Standing Quadratus Lumborum Mobilization with Small Ball on Wall  - 1 x daily - 7 x weekly - 3 sets - 10 reps - Supine Quadratus Lumborum Stretch  - 1 x daily - 7 x weekly - 3 sets - 20-30 hold  ASSESSMENT:  CLINICAL IMPRESSION:  Pt presents to PT for progress note this date. Pt makes progress with , showing significant improvement in overall distance with only minimal increase in pain. Pt making progress toward goals with 5XSTS and showing decreased risk for falls. Pt also  meets goal with oswestry (modified) disability questionnaire showing decreased limitations based on low back related pain and discomfort. Pt will continue to benefit from skilled physical therapy intervention to address impairments, improve QOL, and attain therapy goals. Pt will continue to benefit from skilled physical therapy intervention to address impairments, improve QOL, and attain therapy goals.      OBJECTIVE IMPAIRMENTS: Abnormal gait, decreased activity tolerance, decreased balance, decreased endurance, decreased mobility, difficulty walking, decreased strength, and pain.   ACTIVITY LIMITATIONS: carrying, lifting, bending, sitting, standing, squatting, sleeping, stairs, transfers, and bed mobility  PARTICIPATION LIMITATIONS: cleaning, laundry, shopping, community activity, and yard work  PERSONAL FACTORS: Age, Time since onset of injury/illness/exacerbation, and 3+ comorbidities: scoliosis, HTN, degenerative spine  are also affecting patient's functional outcome.   REHAB POTENTIAL: Good  CLINICAL DECISION MAKING: Stable/uncomplicated  EVALUATION COMPLEXITY: Moderate   GOALS: Goals reviewed with patient? Yes  SHORT TERM GOALS: Target date: 10/24/2022  Pt will decrease worst back pain as reported on NPRS by at least 2 points in order to demonstrate clinically significant reduction in back pain.  Baseline: Eval= 8/10; 11/17/2022= 4/10 current and up to 7-8/10 at worst.  Goal status: MET  LONG TERM GOALS: Target date: 02/27/2023  Pt will  be independent with Final HEP in order to improve strength and decrease back pain in order to improve pain-free function at home and work.  Baseline: EVAL= Patient reports needs update and review of exercises to assist with her back pain; 11/17/2022- Patient performing mostly self stretching and yoga based exercises and will continue to benefit from adding to comprehensive HEP; 12/05/2022- HEP ongoing focusing more on Lumbar strengthening Goal  status: ONGOING  2.  Pt will decrease worst back pain as reported on NPRS by at least 3 points in order to demonstrate clinically significant reduction in back pain.  Baseline: EVAL= 8/10; 11/17/2022= 4/10 current and up to 7-8/10 at worst. 12/05/2022= 4/10 current- and up to 6/10 depending on activity 01/12/23: 4/10 and is consistently at this level.  Goal status: ONGOING  3.  Pt will decrease MODI score by at least 13 points in order demonstrate clinically significant reduction in back pain/disability.   Baseline: EVAL=58%; 11/17/2022= 40%;12/05/2022= 44% 01/12/23:38% Goal status: MET  4.  Pt will improve FOTO to target score of >55 to display perceived improvements in ability to complete ADL's.  Baseline: EVAL= 54; 11/14/22: 56; 12/05/2022=63 Goal status: MET  5.  Patient (> 48 years old) will complete five times sit to stand test in <15 seconds indicating an increased LE strength and improved balance.  Baseline: EVAL = 20.33; 11/17/2022= 16.55 sec without UE Support; 12/05/2022= 16.47 sec without UE support 01/12/23: 15.5 sec  Goal status: PROGRESSING  6.  Pt will increase by at least 0.13 m/s in order to demonstrate clinically significant improvement in community ambulation.   Baseline: EVAL = 0.97 m/s; 6/10=0.98 m/s; 12/05/2022= 0.98 m/s 8/5:1.08 m/s  Goal status: PROGRESSING  7.  Patient will report ability to complete > or equal to 60 min grocery store outing or similar community activiities to return to her previous level of function.  Baseline: EVAL: patient reports difficulty tolerating outings in community. 12/05/2022= Patient reports up to around 40 min of continuous community activities.  01/12/23: last time she went she was up for an hour and used cart and had no issues, has some issues with heat but that has been goingn on for 20 years per pt .  Goal status: MET    8. Pt will improve BERG by at least 3 points in order to demonstrate clinically significant improvement in balance.     Baseline: 09/30/2022= 47/56; 12/05/2022= 53/56  Goal status: MET  9. Pt will increase by at least 79m (169ft) in order to demonstrate clinically significant improvement in cardiopulmonary endurance and community ambulation   Baseline: 09/23/2022= use of SPC RUE (pt preference): starting pain 4/10, then 5/10 at 335ft, and 6/10 at 568ft: audible RR dyspnea. Test terminated after 669ft, 3 minutes 15 seconds; 11/17/2022= 920 feet in 5 min 37 sec c/o 4/10 LBP and some DOE- O2 sat unable to read due to cold fingers- Respirations= 23/min; 12/05/2022= 1050 feet in 6 min  01/12/23: 1030 ft Spo2 95% post bout  Goal status: MET  PLAN:  PT FREQUENCY: 1-2x/week  PT DURATION: 12 weeks  PLANNED INTERVENTIONS: Therapeutic exercises, Therapeutic activity, Neuromuscular re-education, Balance training, Gait training, Patient/Family education, Self Care, Joint mobilization, Joint manipulation, Stair training, Vestibular training, Canalith repositioning, Orthotic/Fit training, DME instructions, Dry Needling, Electrical stimulation, Spinal manipulation, Spinal mobilization, Cryotherapy, Moist heat, scar mobilization, Taping, and Manual therapy.  PLAN FOR NEXT SESSION: Continue to monitor vitals to add data for BP values. Functional LE strengthening and core strength, dynamic balance training.  4:07 PM, 01/12/23    Norman Herrlich, PT 01/12/2023, 4:07 PM   4:07 PM, 01/12/23 Physical Therapist - [facility]  Outpatient Physical Therapy- [facility] [phone]    4:07 PM, 01/12/23

## 2023-01-14 ENCOUNTER — Ambulatory Visit: Payer: Medicare HMO | Admitting: Physical Therapy

## 2023-01-14 ENCOUNTER — Encounter: Payer: Self-pay | Admitting: Physical Therapy

## 2023-01-14 DIAGNOSIS — R269 Unspecified abnormalities of gait and mobility: Secondary | ICD-10-CM | POA: Diagnosis not present

## 2023-01-14 DIAGNOSIS — R262 Difficulty in walking, not elsewhere classified: Secondary | ICD-10-CM

## 2023-01-14 DIAGNOSIS — R2689 Other abnormalities of gait and mobility: Secondary | ICD-10-CM

## 2023-01-14 NOTE — Therapy (Signed)
OUTPATIENT PHYSICAL THERAPY TREATMENT   Patient Name: Kendra Benton MRN: [id number] DOB:December 30, 1940, 82 y.o., female Today's Date: 01/14/2023  END OF SESSION:  PT End of Session - 01/14/23 1606     Visit Number 21    Number of Visits 24    Date for PT Re-Evaluation 02/27/23    Authorization Type [employer] primary; [place] medicaid secondary    Authorization Time Period 09/11/22-12/04/22    Progress Note Due on Visit 30    PT Start Time 1610    PT Stop Time 1652    PT Time Calculation (min) 42 min    Equipment Utilized During Treatment Gait belt    Activity Tolerance Patient tolerated treatment well;No increased pain    Behavior During Therapy WFL for tasks assessed/performed                     Past Medical History:  Diagnosis Date   Allergy    Anxiety    GERD (gastroesophageal reflux disease)    Headache    History of kidney stones    Hypertension    Myocardial infarction (HCC)    1997   Osteoporosis    Pneumonia    Past Surgical History:  Procedure Laterality Date   ABDOMINAL HYSTERECTOMY     patient has ovaries   APPENDECTOMY     ARTERY BIOPSY Right 12/30/2017   Procedure: BIOPSY TEMPORAL ARTERY;  Surgeon: Renford Dills, MD;  Location: [facility];  Service: Vascular;  Laterality: Right;   ARTERY BIOPSY Left 06/18/2018   Procedure: BIOPSY TEMPORAL ARTERY;  Surgeon: Renford Dills, MD;  Location: [facility];  Service: Vascular;  Laterality: Left;   BACK SURGERY     CARDIAC CATHETERIZATION     CATARACT EXTRACTION W/ INTRAOCULAR LENS  IMPLANT, BILATERAL Bilateral 2010   CHOLECYSTECTOMY N/A 02/28/2016   Procedure: LAPAROSCOPIC CHOLECYSTECTOMY WITH INTRAOPERATIVE CHOLANGIOGRAM;  Surgeon: Tiney Rouge III, MD;  Location: [facility];  Service: General;  Laterality: N/A;   LUMBAR FUSION  11/09/2016   L3-L5   SPINE SURGERY     Disc   TONSILLECTOMY     Patient Active Problem List   Diagnosis Date Noted   Fall (on) (from) other stairs and steps, initial  encounter 08/06/2020   Lumbar facet joint syndrome 06/18/2020   Spondylosis without myelopathy or radiculopathy, lumbosacral region 06/18/2020   DDD (degenerative disc disease), lumbosacral 06/18/2020   COVID 06/04/2020   Chronic pain syndrome 05/16/2020   Pharmacologic therapy 05/16/2020   Disorder of skeletal system 05/16/2020   Problems influencing health status 05/16/2020   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   Chronic hip pain (2ry area of Pain) (Bilateral) (R>L) 05/16/2020   Chronic lower extremity pain (Left) 05/16/2020   Numbness and tingling of both feet (intermittent/recurrent) 05/16/2020   Pain in rib (Bilateral) (R>L) (intermittent) 05/16/2020   Chronic knee pain (Left) 05/16/2020   Patellar pain (Left) (chronic, intermittent) 05/16/2020   Hard of hearing 05/16/2020   Carcinoma, lung, 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/25/2019   S/P partial lobectomy of lung 02/25/2019   Mass of upper lobe of right lung 01/06/2019   Chronic nonintractable headache 06/14/2018   Head ache 12/03/2017   Sinus bradycardia 10/06/2017   Chronic low back pain (1ry area of Pain) (Bilateral) (L>R) w/o sciatica 08/17/2017   Panic attack as reaction to stress 08/17/2017   Anxiety 06/15/2017   Atypical chest pain 06/15/2017   Hyperlipidemia 06/15/2017   Cholecystitis with cholelithiasis 02/27/2016   Increased frequency of urination  01/27/2015   History of night sweats 05/01/2014   Dupuytren's contracture 07/18/2013   Inverted nipple 04/18/2013   Depression 04/23/2010   Fibromyalgia 04/23/2010   Hypercholesterolemia 04/23/2010   Essential hypertension 04/23/2010   Hypothyroidism 04/23/2010   Irritable colon 04/23/2010   Osteoarthritis 04/23/2010    PCP: Cain Sieve, MD  REFERRING PROVIDER: Cain Sieve, MD  REFERRING DIAG: Chronic low back pain; At risk for falls  Rationale for Evaluation and Treatment: Rehabilitation  THERAPY DIAG:  Abnormality of gait and mobility  Difficulty  in walking, not elsewhere classified  Other abnormalities of gait and mobility  ONSET DATE: chronic but worse since Jan 2024  SUBJECTIVE:                                                                                                                                                                                           SUBJECTIVE STATEMENT:  Pt reports her knees were sore following her last session as well as her knees. Thinks it may have been related to the leg press activity so this will be held.    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 She has received PT in the past for condition with positive results and currently using pain management strategies including pain meds, Heat, Exercises, Yoga, lidocain patch, diclofenac topical gel, and biofreeze. She was HTN controlled with medication.  She reports still lives alone but having trouble with community and social outings and having to modify how she performs her ADLs and housework due to her chronic pain. PMH- Spondylosis, Scoliosis, HTN, Lung CA.  PAIN:  Are you having pain? Yes 6/10 across low back, midline  PRECAUTIONS: Fall  WEIGHT BEARING RESTRICTIONS: No  FALLS:  Has patient fallen in last 6 months? No- However patient reports some balance issues and near falls- using cane with community outings  PLOF: Independent  PATIENT GOALS: To decrease my pain  NEXT MD VISIT: unknown   OBJECTIVE:   TODAY'S TREATMENT:  DATE:  01/14/23     BP: 151/63  mmHg with HR 83 TE Sidestepping with YTB around ankles x 12 ea direction/side  Hip march with UE support with 3# AW 2 x 10 ea LE   NMR 1/2 foam step over with 3 # AW x 10 ea LE  TE Seated LAQ 2 x 10 with 3# AW   NMR Balance on airex beam with normal BOS x  45 sec  Tandem balance on balance beam x 30 sec ea LE   TE -STS from chair x10 hands free Seated clamshell with GTB 2 x 10   -attempted seated gluteal extension with GTB but pt reported pain in the low back with this so it was ceased, postural cues and positional cues did not alleviate reported pain but cessation of exercise alleviated pain  Nustep seat 5, arms 9 x 5 minutes level 2    PATIENT EDUCATION:  Education details: PT plan of care; Exercise technique  Person educated: Patient Education method: Explanation, Demonstration, Tactile cues, and Verbal cues Education comprehension: verbalized understanding, returned demonstration, verbal cues required, and tactile cues required  HOME EXERCISE PROGRAM: 11/14/22:  Side step c red band around knees, back against wall   Access Code: [email or web address] URL: [email or web address] Date: 10/13/2022 Prepared by: Maureen Ralphs  Exercises - Standing Quadratus Lumborum Stretch with Doorway  - 1 x daily - 7 x weekly - 3 sets - 20-30 sec hold - Standing Quadratus Lumborum Mobilization with Small Ball on Wall  - 1 x daily - 7 x weekly - 3 sets - 10 reps - Supine Quadratus Lumborum Stretch  - 1 x daily - 7 x weekly - 3 sets - 20-30 hold - 90/90 SI Joint Self-Correction with Dowel  - 1 x daily - 7 x weekly - 3 sets - 10 reps - Hooklying Isometric Hip Abduction with Belt  - 1 x daily - 7 x weekly - 3 sets - 10 reps - 5 hold    Access Code: [email or web address] URL: [email or web address] Date: 10/09/2022 Prepared by: Maureen Ralphs  Exercises - Standing Quadratus Lumborum Stretch with Doorway  - 1 x daily - 7 x weekly - 3 sets - 20-30 sec hold - Standing Quadratus Lumborum Mobilization with Small Ball on Wall  - 1 x daily - 7 x weekly - 3 sets - 10 reps - Supine Quadratus Lumborum Stretch  - 1 x daily - 7 x weekly - 3 sets - 20-30 hold  ASSESSMENT:  CLINICAL IMPRESSION:  Pt presents to PT with good motivation for  completion of PT activities. Pt has some lightheadedness with prolonged standing activities that improved with seated therex and rest. Pt was closely monitored to ensure activities did not exacerbate pain in knee or back. Pt will continue to benefit from skilled physical therapy intervention to address impairments, improve QOL, and attain therapy goals.       OBJECTIVE IMPAIRMENTS: Abnormal gait, decreased activity tolerance, decreased balance, decreased endurance, decreased mobility, difficulty walking, decreased strength, and pain.   ACTIVITY LIMITATIONS: carrying, lifting, bending, sitting, standing, squatting, sleeping, stairs, transfers, and bed mobility  PARTICIPATION LIMITATIONS: cleaning, laundry, shopping, community activity, and yard work  PERSONAL FACTORS: Age, Time since onset of injury/illness/exacerbation, and 3+ comorbidities: scoliosis, HTN, degenerative spine  are also affecting patient's functional outcome.   REHAB POTENTIAL: Good  CLINICAL DECISION MAKING: Stable/uncomplicated  EVALUATION COMPLEXITY: Moderate   GOALS: Goals reviewed with patient? Yes  SHORT TERM GOALS: Target date: 10/24/2022  Pt will decrease worst  back pain as reported on NPRS by at least 2 points in order to demonstrate clinically significant reduction in back pain.  Baseline: Eval= 8/10; 11/17/2022= 4/10 current and up to 7-8/10 at worst.  Goal status: MET  LONG TERM GOALS: Target date: 02/27/2023  Pt will be independent with Final HEP in order to improve strength and decrease back pain in order to improve pain-free function at home and work.  Baseline: EVAL= Patient reports needs update and review of exercises to assist with her back pain; 11/17/2022- Patient performing mostly self stretching and yoga based exercises and will continue to benefit from adding to comprehensive HEP; 12/05/2022- HEP ongoing focusing more on Lumbar strengthening Goal status: ONGOING  2.  Pt will decrease worst back  pain as reported on NPRS by at least 3 points in order to demonstrate clinically significant reduction in back pain.  Baseline: EVAL= 8/10; 11/17/2022= 4/10 current and up to 7-8/10 at worst. 12/05/2022= 4/10 current- and up to 6/10 depending on activity 01/12/23: 4/10 and is consistently at this level.  Goal status: ONGOING  3.  Pt will decrease MODI score by at least 13 points in order demonstrate clinically significant reduction in back pain/disability.   Baseline: EVAL=58%; 11/17/2022= 40%;12/05/2022= 44% 01/12/23:38% Goal status: MET  4.  Pt will improve FOTO to target score of >55 to display perceived improvements in ability to complete ADL's.  Baseline: EVAL= 54; 11/14/22: 56; 12/05/2022=63 Goal status: MET  5.  Patient (> 50 years old) will complete five times sit to stand test in <15 seconds indicating an increased LE strength and improved balance.  Baseline: EVAL = 20.33; 11/17/2022= 16.55 sec without UE Support; 12/05/2022= 16.47 sec without UE support 01/12/23: 15.5 sec  Goal status: PROGRESSING  6.  Pt will increase by at least 0.13 m/s in order to demonstrate clinically significant improvement in community ambulation.   Baseline: EVAL = 0.97 m/s; 6/10=0.98 m/s; 12/05/2022= 0.98 m/s 8/5:1.08 m/s  Goal status: PROGRESSING  7.  Patient will report ability to complete > or equal to 60 min grocery store outing or similar community activiities to return to her previous level of function.  Baseline: EVAL: patient reports difficulty tolerating outings in community. 12/05/2022= Patient reports up to around 40 min of continuous community activities.  01/12/23: last time she went she was up for an hour and used cart and had no issues, has some issues with heat but that has been goingn on for 20 years per pt .  Goal status: MET    8. Pt will improve BERG by at least 3 points in order to demonstrate clinically significant improvement in balance.    Baseline: 09/30/2022= 47/56; 12/05/2022= 53/56  Goal  status: MET  9. Pt will increase by at least 60m (158ft) in order to demonstrate clinically significant improvement in cardiopulmonary endurance and community ambulation   Baseline: 09/23/2022= use of SPC RUE (pt preference): starting pain 4/10, then 5/10 at 373ft, and 6/10 at 552ft: audible RR dyspnea. Test terminated after 672ft, 3 minutes 15 seconds; 11/17/2022= 920 feet in 5 min 37 sec c/o 4/10 LBP and some DOE- O2 sat unable to read due to cold fingers- Respirations= 23/min; 12/05/2022= 1050 feet in 6 min  01/12/23: 1030 ft Spo2 95% post bout  Goal status: MET  PLAN:  PT FREQUENCY: 1-2x/week  PT DURATION: 12 weeks  PLANNED INTERVENTIONS: Therapeutic exercises, Therapeutic activity, Neuromuscular re-education, Balance training, Gait training, Patient/Family education, Self Care, Joint mobilization, Joint manipulation, Stair training, Vestibular  training, Canalith repositioning, Orthotic/Fit training, DME instructions, Dry Needling, Electrical stimulation, Spinal manipulation, Spinal mobilization, Cryotherapy, Moist heat, scar mobilization, Taping, and Manual therapy.  PLAN FOR NEXT SESSION: Continue to monitor vitals to add data for BP values. Functional LE strengthening and core strength, dynamic balance training.    4:11 PM, 01/14/23    Norman Herrlich, PT 01/14/2023, 4:11 PM   4:11 PM, 01/14/23 Physical Therapist - [facility]  Outpatient Physical Therapy- Main Campus [phone]    4:11 PM, 01/14/23

## 2023-01-19 ENCOUNTER — Ambulatory Visit: Payer: Medicare HMO

## 2023-01-21 ENCOUNTER — Ambulatory Visit: Payer: Medicare HMO

## 2023-01-21 DIAGNOSIS — M6281 Muscle weakness (generalized): Secondary | ICD-10-CM

## 2023-01-21 DIAGNOSIS — R2681 Unsteadiness on feet: Secondary | ICD-10-CM

## 2023-01-21 DIAGNOSIS — R2689 Other abnormalities of gait and mobility: Secondary | ICD-10-CM

## 2023-01-21 DIAGNOSIS — R269 Unspecified abnormalities of gait and mobility: Secondary | ICD-10-CM | POA: Diagnosis not present

## 2023-01-21 DIAGNOSIS — R278 Other lack of coordination: Secondary | ICD-10-CM

## 2023-01-21 DIAGNOSIS — M545 Low back pain, unspecified: Secondary | ICD-10-CM

## 2023-01-21 DIAGNOSIS — R262 Difficulty in walking, not elsewhere classified: Secondary | ICD-10-CM

## 2023-01-21 NOTE — Therapy (Signed)
OUTPATIENT PHYSICAL THERAPY TREATMENT   Patient Name: Kendra Benton MRN: [id number] DOB:February 04, 1941, 82 y.o., female Today's Date: 01/22/2023  END OF SESSION:  PT End of Session - 01/21/23 1619     Visit Number 22    Number of Visits 38   date corrected to reflect most recent recert   Date for PT Re-Evaluation 02/27/23    Authorization Type [employer] primary; [place] medicaid secondary    Authorization Time Period 09/11/22-12/04/22    Progress Note Due on Visit 30    PT Start Time 1619    PT Stop Time 1657    PT Time Calculation (min) 38 min    Equipment Utilized During Treatment Gait belt    Activity Tolerance Patient tolerated treatment well;No increased pain    Behavior During Therapy WFL for tasks assessed/performed                     Past Medical History:  Diagnosis Date   Allergy    Anxiety    GERD (gastroesophageal reflux disease)    Headache    History of kidney stones    Hypertension    Myocardial infarction (HCC)    1997   Osteoporosis    Pneumonia    Past Surgical History:  Procedure Laterality Date   ABDOMINAL HYSTERECTOMY     patient has ovaries   APPENDECTOMY     ARTERY BIOPSY Right 12/30/2017   Procedure: BIOPSY TEMPORAL ARTERY;  Surgeon: Renford Dills, MD;  Location: [facility];  Service: Vascular;  Laterality: Right;   ARTERY BIOPSY Left 06/18/2018   Procedure: BIOPSY TEMPORAL ARTERY;  Surgeon: Renford Dills, MD;  Location: [facility];  Service: Vascular;  Laterality: Left;   BACK SURGERY     CARDIAC CATHETERIZATION     CATARACT EXTRACTION W/ INTRAOCULAR LENS  IMPLANT, BILATERAL Bilateral 2010   CHOLECYSTECTOMY N/A 02/28/2016   Procedure: LAPAROSCOPIC CHOLECYSTECTOMY WITH INTRAOPERATIVE CHOLANGIOGRAM;  Surgeon: Tiney Rouge III, MD;  Location: [facility];  Service: General;  Laterality: N/A;   LUMBAR FUSION  11/09/2016   L3-L5   SPINE SURGERY     Disc   TONSILLECTOMY     Patient Active Problem List   Diagnosis Date Noted   Fall  (on) (from) other stairs and steps, initial encounter 08/06/2020   Lumbar facet joint syndrome 06/18/2020   Spondylosis without myelopathy or radiculopathy, lumbosacral region 06/18/2020   DDD (degenerative disc disease), lumbosacral 06/18/2020   COVID 06/04/2020   Chronic pain syndrome 05/16/2020   Pharmacologic therapy 05/16/2020   Disorder of skeletal system 05/16/2020   Problems influencing health status 05/16/2020   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   Chronic hip pain (2ry area of Pain) (Bilateral) (R>L) 05/16/2020   Chronic lower extremity pain (Left) 05/16/2020   Numbness and tingling of both feet (intermittent/recurrent) 05/16/2020   Pain in rib (Bilateral) (R>L) (intermittent) 05/16/2020   Chronic knee pain (Left) 05/16/2020   Patellar pain (Left) (chronic, intermittent) 05/16/2020   Hard of hearing 05/16/2020   Carcinoma, lung, 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/25/2019   S/P partial lobectomy of lung 02/25/2019   Mass of upper lobe of right lung 01/06/2019   Chronic nonintractable headache 06/14/2018   Head ache 12/03/2017   Sinus bradycardia 10/06/2017   Chronic low back pain (1ry area of Pain) (Bilateral) (L>R) w/o sciatica 08/17/2017   Panic attack as reaction to stress 08/17/2017   Anxiety 06/15/2017   Atypical chest pain 06/15/2017   Hyperlipidemia 06/15/2017   Cholecystitis with  cholelithiasis 02/27/2016   Increased frequency of urination 01/27/2015   History of night sweats 05/01/2014   Dupuytren's contracture 07/18/2013   Inverted nipple 04/18/2013   Depression 04/23/2010   Fibromyalgia 04/23/2010   Hypercholesterolemia 04/23/2010   Essential hypertension 04/23/2010   Hypothyroidism 04/23/2010   Irritable colon 04/23/2010   Osteoarthritis 04/23/2010    PCP: Cain Sieve, MD  REFERRING PROVIDER: Cain Sieve, MD  REFERRING DIAG: Chronic low back pain; At risk for falls  Rationale for Evaluation and Treatment: Rehabilitation  THERAPY DIAG:   Abnormality of gait and mobility  Difficulty in walking, not elsewhere classified  Other abnormalities of gait and mobility  Chronic bilateral low back pain without sciatica  Muscle weakness (generalized)  Other lack of coordination  Unsteadiness on feet  ONSET DATE: chronic but worse since Jan 2024  SUBJECTIVE:                                                                                                                                                                                           SUBJECTIVE STATEMENT:  Pt reports not feeling great and being off balance more last couple of days.    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 She has received PT in the past for condition with positive results and currently using pain management strategies including pain meds, Heat, Exercises, Yoga, lidocain patch, diclofenac topical gel, and biofreeze. She was HTN controlled with medication.  She reports still lives alone but having trouble with community and social outings and having to modify how she performs her ADLs and housework due to her chronic pain. PMH- Spondylosis, Scoliosis, HTN, Lung CA.  PAIN:  Are you having pain? Yes 6/10 across low back, midline  PRECAUTIONS: Fall  WEIGHT BEARING RESTRICTIONS: No  FALLS:  Has patient fallen in last 6 months? No- However patient reports some balance issues and near falls- using cane with community outings  PLOF: Independent  PATIENT GOALS: To decrease my pain  NEXT MD VISIT: unknown   OBJECTIVE:   TODAY'S TREATMENT:  DATE:  01/22/23     BP: 153/62  mmHg with HR 83 (sitting upon arrival) BP: 110/61 mmHg with HR  (Standing after 5 min into visit)     TE Passive stretching:  LTR x 10 each way Single knee  to chest x 10 each Piriformis- hold 10 sec x 3  Hip circles- 10 each direction (cw and ccw)  Hip IR/ER- x 20 reps each    PATIENT EDUCATION:  Education details: PT plan of care; Exercise technique  Person educated: Patient Education method: Explanation, Demonstration, Tactile cues, and Verbal cues Education comprehension: verbalized understanding, returned demonstration, verbal cues required, and tactile cues required  HOME EXERCISE PROGRAM: 11/14/22:  Side step c red band around knees, back against wall   Access Code: [email or web address] URL: [email or web address] Date: 10/13/2022 Prepared by: Maureen Ralphs  Exercises - Standing Quadratus Lumborum Stretch with Doorway  - 1 x daily - 7 x weekly - 3 sets - 20-30 sec hold - Standing Quadratus Lumborum Mobilization with Small Ball on Wall  - 1 x daily - 7 x weekly - 3 sets - 10 reps - Supine Quadratus Lumborum Stretch  - 1 x daily - 7 x weekly - 3 sets - 20-30 hold - 90/90 SI Joint Self-Correction with Dowel  - 1 x daily - 7 x weekly - 3 sets - 10 reps - Hooklying Isometric Hip Abduction with Belt  - 1 x daily - 7 x weekly - 3 sets - 10 reps - 5 hold    Access Code: [email or web address] URL: [email or web address] Date: 10/09/2022 Prepared by: Maureen Ralphs  Exercises - Standing Quadratus Lumborum Stretch with Doorway  - 1 x daily - 7 x weekly - 3 sets - 20-30 sec hold - Standing Quadratus Lumborum Mobilization with Small Ball on Wall  - 1 x daily - 7 x weekly - 3 sets - 10 reps - Supine Quadratus Lumborum Stretch  - 1 x daily - 7 x weekly - 3 sets - 20-30 hold  ASSESSMENT:  CLINICAL IMPRESSION:  Pt presents to PT with some report of not feeling better. Orthostatic values (chronic issue) and patient knowledgeable in conversation with PCP but responded favorably to passive stretching and reported improved overall low back pain. Pt will continue to benefit from skilled physical therapy intervention to address  impairments, improve QOL, and attain therapy goals.       OBJECTIVE IMPAIRMENTS: Abnormal gait, decreased activity tolerance, decreased balance, decreased endurance, decreased mobility, difficulty walking, decreased strength, and pain.   ACTIVITY LIMITATIONS: carrying, lifting, bending, sitting, standing, squatting, sleeping, stairs, transfers, and bed mobility  PARTICIPATION LIMITATIONS: cleaning, laundry, shopping, community activity, and yard work  PERSONAL FACTORS: Age, Time since onset of injury/illness/exacerbation, and 3+ comorbidities: scoliosis, HTN, degenerative spine  are also affecting patient's functional outcome.   REHAB POTENTIAL: Good  CLINICAL DECISION MAKING: Stable/uncomplicated  EVALUATION COMPLEXITY: Moderate   GOALS: Goals reviewed with patient? Yes  SHORT TERM GOALS: Target date: 10/24/2022  Pt will decrease worst back pain as reported on NPRS by at least 2 points in order to demonstrate clinically significant reduction in back pain.  Baseline: Eval= 8/10; 11/17/2022= 4/10 current and up to 7-8/10 at worst.  Goal status: MET  LONG TERM GOALS: Target date: 02/27/2023  Pt will be independent with Final HEP in order to improve strength and decrease back pain in order to improve pain-free function at home and work.  Baseline: EVAL= Patient reports needs update and review of exercises to assist with her back  pain; 11/17/2022- Patient performing mostly self stretching and yoga based exercises and will continue to benefit from adding to comprehensive HEP; 12/05/2022- HEP ongoing focusing more on Lumbar strengthening Goal status: ONGOING  2.  Pt will decrease worst back pain as reported on NPRS by at least 3 points in order to demonstrate clinically significant reduction in back pain.  Baseline: EVAL= 8/10; 11/17/2022= 4/10 current and up to 7-8/10 at worst. 12/05/2022= 4/10 current- and up to 6/10 depending on activity 01/12/23: 4/10 and is consistently at this level.   Goal status: ONGOING  3.  Pt will decrease MODI score by at least 13 points in order demonstrate clinically significant reduction in back pain/disability.   Baseline: EVAL=58%; 11/17/2022= 40%;12/05/2022= 44% 01/12/23:38% Goal status: MET  4.  Pt will improve FOTO to target score of >55 to display perceived improvements in ability to complete ADL's.  Baseline: EVAL= 54; 11/14/22: 56; 12/05/2022=63 Goal status: MET  5.  Patient (> 40 years old) will complete five times sit to stand test in <15 seconds indicating an increased LE strength and improved balance.  Baseline: EVAL = 20.33; 11/17/2022= 16.55 sec without UE Support; 12/05/2022= 16.47 sec without UE support 01/12/23: 15.5 sec  Goal status: PROGRESSING  6.  Pt will increase by at least 0.13 m/s in order to demonstrate clinically significant improvement in community ambulation.   Baseline: EVAL = 0.97 m/s; 6/10=0.98 m/s; 12/05/2022= 0.98 m/s 8/5:1.08 m/s  Goal status: PROGRESSING  7.  Patient will report ability to complete > or equal to 60 min grocery store outing or similar community activiities to return to her previous level of function.  Baseline: EVAL: patient reports difficulty tolerating outings in community. 12/05/2022= Patient reports up to around 40 min of continuous community activities.  01/12/23: last time she went she was up for an hour and used cart and had no issues, has some issues with heat but that has been goingn on for 20 years per pt .  Goal status: MET    8. Pt will improve BERG by at least 3 points in order to demonstrate clinically significant improvement in balance.    Baseline: 09/30/2022= 47/56; 12/05/2022= 53/56  Goal status: MET  9. Pt will increase by at least 58m (163ft) in order to demonstrate clinically significant improvement in cardiopulmonary endurance and community ambulation   Baseline: 09/23/2022= use of SPC RUE (pt preference): starting pain 4/10, then 5/10 at 361ft, and 6/10 at 564ft: audible RR  dyspnea. Test terminated after 657ft, 3 minutes 15 seconds; 11/17/2022= 920 feet in 5 min 37 sec c/o 4/10 LBP and some DOE- O2 sat unable to read due to cold fingers- Respirations= 23/min; 12/05/2022= 1050 feet in 6 min  01/12/23: 1030 ft Spo2 95% post bout  Goal status: MET  PLAN:  PT FREQUENCY: 1-2x/week  PT DURATION: 12 weeks  PLANNED INTERVENTIONS: Therapeutic exercises, Therapeutic activity, Neuromuscular re-education, Balance training, Gait training, Patient/Family education, Self Care, Joint mobilization, Joint manipulation, Stair training, Vestibular training, Canalith repositioning, Orthotic/Fit training, DME instructions, Dry Needling, Electrical stimulation, Spinal manipulation, Spinal mobilization, Cryotherapy, Moist heat, scar mobilization, Taping, and Manual therapy.  PLAN FOR NEXT SESSION: Continue to monitor vitals to add data for BP values. Functional LE strengthening and core strength, dynamic balance training.    5:45 PM, 01/22/23    Lenda Kelp, PT 01/22/2023, 5:45 PM   5:45 PM, 01/22/23 Physical Therapist - [facility]  Outpatient Physical Therapy- [facility] [phone]  5:45 PM, 01/22/23

## 2023-01-26 ENCOUNTER — Ambulatory Visit: Payer: Medicare HMO

## 2023-01-28 ENCOUNTER — Ambulatory Visit: Payer: Medicare HMO

## 2023-01-28 DIAGNOSIS — R262 Difficulty in walking, not elsewhere classified: Secondary | ICD-10-CM

## 2023-01-28 DIAGNOSIS — R269 Unspecified abnormalities of gait and mobility: Secondary | ICD-10-CM

## 2023-01-28 DIAGNOSIS — R2689 Other abnormalities of gait and mobility: Secondary | ICD-10-CM

## 2023-01-28 DIAGNOSIS — G8929 Other chronic pain: Secondary | ICD-10-CM

## 2023-01-28 DIAGNOSIS — R278 Other lack of coordination: Secondary | ICD-10-CM

## 2023-01-28 DIAGNOSIS — R2681 Unsteadiness on feet: Secondary | ICD-10-CM

## 2023-01-28 DIAGNOSIS — M6281 Muscle weakness (generalized): Secondary | ICD-10-CM

## 2023-01-28 NOTE — Therapy (Signed)
OUTPATIENT PHYSICAL THERAPY TREATMENT   Patient Name: Kendra Benton MRN: [id number] DOB:1940/09/28, 82 y.o., female Today's Date: 01/28/2023  END OF SESSION:  PT End of Session - 01/28/23 1013     Visit Number 23    Number of Visits 38   date corrected to reflect most recent recert   Date for PT Re-Evaluation 02/27/23    Authorization Type [employer] primary;  medicaid secondary    Authorization Time Period 09/11/22-12/04/22    Progress Note Due on Visit 30    PT Start Time 1012    PT Stop Time 1058    PT Time Calculation (min) 46 min    Equipment Utilized During Treatment Gait belt    Activity Tolerance Patient tolerated treatment well;No increased pain    Behavior During Therapy WFL for tasks assessed/performed                      Past Medical History:  Diagnosis Date   Allergy    Anxiety    GERD (gastroesophageal reflux disease)    Headache    History of kidney stones    Hypertension    Myocardial infarction (HCC)    1997   Osteoporosis    Pneumonia    Past Surgical History:  Procedure Laterality Date   ABDOMINAL HYSTERECTOMY     patient has ovaries   APPENDECTOMY     ARTERY BIOPSY Right 12/30/2017   Procedure: BIOPSY TEMPORAL ARTERY;  Surgeon: Renford Dills, MD;  Location: [facility];  Service: Vascular;  Laterality: Right;   ARTERY BIOPSY Left 06/18/2018   Procedure: BIOPSY TEMPORAL ARTERY;  Surgeon: Renford Dills, MD;  Location: [facility];  Service: Vascular;  Laterality: Left;   BACK SURGERY     CARDIAC CATHETERIZATION     CATARACT EXTRACTION W/ INTRAOCULAR LENS  IMPLANT, BILATERAL Bilateral 2010   CHOLECYSTECTOMY N/A 02/28/2016   Procedure: LAPAROSCOPIC CHOLECYSTECTOMY WITH INTRAOPERATIVE CHOLANGIOGRAM;  Surgeon: Tiney Rouge III, MD;  Location: [facility];  Service: General;  Laterality: N/A;   LUMBAR FUSION  11/09/2016   L3-L5   SPINE SURGERY     Disc   TONSILLECTOMY     Patient Active Problem List   Diagnosis Date Noted   Fall  (on) (from) other stairs and steps, initial encounter 08/06/2020   Lumbar facet joint syndrome 06/18/2020   Spondylosis without myelopathy or radiculopathy, lumbosacral region 06/18/2020   DDD (degenerative disc disease), lumbosacral 06/18/2020   COVID 06/04/2020   Chronic pain syndrome 05/16/2020   Pharmacologic therapy 05/16/2020   Disorder of skeletal system 05/16/2020   Problems influencing health status 05/16/2020   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   Chronic hip pain (2ry area of Pain) (Bilateral) (R>L) 05/16/2020   Chronic lower extremity pain (Left) 05/16/2020   Numbness and tingling of both feet (intermittent/recurrent) 05/16/2020   Pain in rib (Bilateral) (R>L) (intermittent) 05/16/2020   Chronic knee pain (Left) 05/16/2020   Patellar pain (Left) (chronic, intermittent) 05/16/2020   Hard of hearing 05/16/2020   Carcinoma, lung, 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/25/2019   S/P partial lobectomy of lung 02/25/2019   Mass of upper lobe of right lung 01/06/2019   Chronic nonintractable headache 06/14/2018   Head ache 12/03/2017   Sinus bradycardia 10/06/2017   Chronic low back pain (1ry area of Pain) (Bilateral) (L>R) w/o sciatica 08/17/2017   Panic attack as reaction to stress 08/17/2017   Anxiety 06/15/2017   Atypical chest pain 06/15/2017   Hyperlipidemia 06/15/2017   Cholecystitis  with cholelithiasis 02/27/2016   Increased frequency of urination 01/27/2015   History of night sweats 05/01/2014   Dupuytren's contracture 07/18/2013   Inverted nipple 04/18/2013   Depression 04/23/2010   Fibromyalgia 04/23/2010   Hypercholesterolemia 04/23/2010   Essential hypertension 04/23/2010   Hypothyroidism 04/23/2010   Irritable colon 04/23/2010   Osteoarthritis 04/23/2010    PCP: Cain Sieve, MD  REFERRING PROVIDER: Cain Sieve, MD  REFERRING DIAG: Chronic low back pain; At risk for falls  Rationale for Evaluation and Treatment: Rehabilitation  THERAPY DIAG:   Abnormality of gait and mobility  Difficulty in walking, not elsewhere classified  Other abnormalities of gait and mobility  Chronic bilateral low back pain without sciatica  Muscle weakness (generalized)  Other lack of coordination  Unsteadiness on feet  Chronic left-sided low back pain without sciatica  ONSET DATE: chronic but worse since Jan 2024  SUBJECTIVE:                                                                                                                                                                                           SUBJECTIVE STATEMENT:  Pt reports missed last visit due to some diverticulitis but states feeling better overall today.    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 She has received PT in the past for condition with positive results and currently using pain management strategies including pain meds, Heat, Exercises, Yoga, lidocain patch, diclofenac topical gel, and biofreeze. She was HTN controlled with medication.  She reports still lives alone but having trouble with community and social outings and having to modify how she performs her ADLs and housework due to her chronic pain. PMH- Spondylosis, Scoliosis, HTN, Lung CA.  PAIN:  Are you having pain? Yes 6/10 across low back, midline  PRECAUTIONS: Fall  WEIGHT BEARING RESTRICTIONS: No  FALLS:  Has patient fallen in last 6 months? No- However patient reports some balance issues and near falls- using cane with community outings  PLOF: Independent  PATIENT GOALS: To decrease my pain  NEXT MD VISIT: unknown   OBJECTIVE:   TODAY'S TREATMENT:  DATE:  01/28/23        TE Active seated  stretching:  Lumbar flex -hold 30 sec x 2  Hamstring each LE 30 sec x  2 Seated figure 4 hold 30 sec x 2   Seated hip abd up/over 1/2 foam x 15 reps each LE Standing lunge squat onto bosu 10 reps each LE Sit to stand with UE shoulder raise x 10 reps   NMR:   Step tap onto 2nd step without UE Support x 20 reps alt LE Side step up/over 1/2 foam roll x 20 reps each LE Forward/retro step over 1/2 foam roll x 20 reps each LE Tandem stance on firm surface x 30 sec x 2 each LE Tandem stance on 1/2 foam roll (curve side up) x 2 trials- on second trial- 30 sec each LE  Single leg standing x multiple attempts- at best able to hold 20 sec each LE     PATIENT EDUCATION:  Education details: PT plan of care; Exercise technique  Person educated: Patient Education method: Explanation, Demonstration, Tactile cues, and Verbal cues Education comprehension: verbalized understanding, returned demonstration, verbal cues required, and tactile cues required  HOME EXERCISE PROGRAM: 11/14/22:  Side step c red band around knees, back against wall   Access Code: [email or web address] URL: [email or web address] Date: 10/13/2022 Prepared by: Maureen Ralphs  Exercises - Standing Quadratus Lumborum Stretch with Doorway  - 1 x daily - 7 x weekly - 3 sets - 20-30 sec hold - Standing Quadratus Lumborum Mobilization with Small Ball on Wall  - 1 x daily - 7 x weekly - 3 sets - 10 reps - Supine Quadratus Lumborum Stretch  - 1 x daily - 7 x weekly - 3 sets - 20-30 hold - 90/90 SI Joint Self-Correction with Dowel  - 1 x daily - 7 x weekly - 3 sets - 10 reps - Hooklying Isometric Hip Abduction with Belt  - 1 x daily - 7 x weekly - 3 sets - 10 reps - 5 hold    Access Code: [email or web address] URL: [email or web address] Date: 10/09/2022 Prepared by: Maureen Ralphs  Exercises - Standing Quadratus Lumborum Stretch with Doorway  - 1 x daily - 7 x weekly - 3 sets - 20-30 sec hold - Standing Quadratus Lumborum Mobilization with Small Ball on Wall  - 1 x daily - 7 x weekly -  3 sets - 10 reps - Supine Quadratus Lumborum Stretch  - 1 x daily - 7 x weekly - 3 sets - 20-30 hold  ASSESSMENT:  CLINICAL IMPRESSION:  Patient feeling better so able to progress her LE strengthening and balance in standing position without any report of dizziness today. She demonstrated vastly improved balance and even eventually stood for 20 sec on each leg. She was also able to demonstrate progress with time in tandem and no significant LOB. Pt will continue to benefit from skilled physical therapy intervention to address impairments, improve QOL, and attain therapy goals.       OBJECTIVE IMPAIRMENTS: Abnormal gait, decreased activity tolerance, decreased balance, decreased endurance, decreased mobility, difficulty walking, decreased strength, and pain.   ACTIVITY LIMITATIONS: carrying, lifting, bending, sitting, standing, squatting, sleeping, stairs, transfers, and bed mobility  PARTICIPATION LIMITATIONS: cleaning, laundry, shopping, community activity, and yard work  PERSONAL FACTORS: Age, Time since onset of injury/illness/exacerbation, and 3+ comorbidities: scoliosis, HTN, degenerative spine  are also affecting patient's functional outcome.   REHAB POTENTIAL: Good  CLINICAL DECISION MAKING: Stable/uncomplicated  EVALUATION COMPLEXITY: Moderate   GOALS: Goals  reviewed with patient? Yes  SHORT TERM GOALS: Target date: 10/24/2022  Pt will decrease worst back pain as reported on NPRS by at least 2 points in order to demonstrate clinically significant reduction in back pain.  Baseline: Eval= 8/10; 11/17/2022= 4/10 current and up to 7-8/10 at worst.  Goal status: MET  LONG TERM GOALS: Target date: 02/27/2023  Pt will be independent with Final HEP in order to improve strength and decrease back pain in order to improve pain-free function at home and work.  Baseline: EVAL= Patient reports needs update and review of exercises to assist with her back pain; 11/17/2022- Patient  performing mostly self stretching and yoga based exercises and will continue to benefit from adding to comprehensive HEP; 12/05/2022- HEP ongoing focusing more on Lumbar strengthening Goal status: ONGOING  2.  Pt will decrease worst back pain as reported on NPRS by at least 3 points in order to demonstrate clinically significant reduction in back pain.  Baseline: EVAL= 8/10; 11/17/2022= 4/10 current and up to 7-8/10 at worst. 12/05/2022= 4/10 current- and up to 6/10 depending on activity 01/12/23: 4/10 and is consistently at this level.  Goal status: ONGOING  3.  Pt will decrease MODI score by at least 13 points in order demonstrate clinically significant reduction in back pain/disability.   Baseline: EVAL=58%; 11/17/2022= 40%;12/05/2022= 44% 01/12/23:38% Goal status: MET  4.  Pt will improve FOTO to target score of >55 to display perceived improvements in ability to complete ADL's.  Baseline: EVAL= 54; 11/14/22: 56; 12/05/2022=63 Goal status: MET  5.  Patient (> 65 years old) will complete five times sit to stand test in <15 seconds indicating an increased LE strength and improved balance.  Baseline: EVAL = 20.33; 11/17/2022= 16.55 sec without UE Support; 12/05/2022= 16.47 sec without UE support 01/12/23: 15.5 sec  Goal status: PROGRESSING  6.  Pt will increase by at least 0.13 m/s in order to demonstrate clinically significant improvement in community ambulation.   Baseline: EVAL = 0.97 m/s; 6/10=0.98 m/s; 12/05/2022= 0.98 m/s 8/5:1.08 m/s  Goal status: PROGRESSING  7.  Patient will report ability to complete > or equal to 60 min grocery store outing or similar community activiities to return to her previous level of function.  Baseline: EVAL: patient reports difficulty tolerating outings in community. 12/05/2022= Patient reports up to around 40 min of continuous community activities.  01/12/23: last time she went she was up for an hour and used cart and had no issues, has some issues with heat but that  has been goingn on for 20 years per pt .  Goal status: MET    8. Pt will improve BERG by at least 3 points in order to demonstrate clinically significant improvement in balance.    Baseline: 09/30/2022= 47/56; 12/05/2022= 53/56  Goal status: MET  9. Pt will increase by at least 71m (166ft) in order to demonstrate clinically significant improvement in cardiopulmonary endurance and community ambulation   Baseline: 09/23/2022= use of SPC RUE (pt preference): starting pain 4/10, then 5/10 at 349ft, and 6/10 at 557ft: audible RR dyspnea. Test terminated after 681ft, 3 minutes 15 seconds; 11/17/2022= 920 feet in 5 min 37 sec c/o 4/10 LBP and some DOE- O2 sat unable to read due to cold fingers- Respirations= 23/min; 12/05/2022= 1050 feet in 6 min  01/12/23: 1030 ft Spo2 95% post bout  Goal status: MET  PLAN:  PT FREQUENCY: 1-2x/week  PT DURATION: 12 weeks  PLANNED INTERVENTIONS: Therapeutic exercises, Therapeutic activity, Neuromuscular  re-education, Balance training, Gait training, Patient/Family education, Self Care, Joint mobilization, Joint manipulation, Stair training, Vestibular training, Canalith repositioning, Orthotic/Fit training, DME instructions, Dry Needling, Electrical stimulation, Spinal manipulation, Spinal mobilization, Cryotherapy, Moist heat, scar mobilization, Taping, and Manual therapy.  PLAN FOR NEXT SESSION: Continue to monitor vitals to add data for BP values. Functional LE strengthening and core strength, dynamic balance training.    4:50 PM, 01/28/23    Lenda Kelp, PT 01/28/2023, 4:50 PM   4:50 PM, 01/28/23 Physical Therapist - [facility]  Outpatient Physical Therapy- [facility] [phone]    4:50 PM, 01/28/23

## 2023-02-02 ENCOUNTER — Ambulatory Visit: Payer: Medicare HMO

## 2023-02-02 DIAGNOSIS — R269 Unspecified abnormalities of gait and mobility: Secondary | ICD-10-CM

## 2023-02-02 DIAGNOSIS — M6281 Muscle weakness (generalized): Secondary | ICD-10-CM

## 2023-02-02 DIAGNOSIS — R2689 Other abnormalities of gait and mobility: Secondary | ICD-10-CM

## 2023-02-02 DIAGNOSIS — R2681 Unsteadiness on feet: Secondary | ICD-10-CM

## 2023-02-02 DIAGNOSIS — G8929 Other chronic pain: Secondary | ICD-10-CM

## 2023-02-02 DIAGNOSIS — R262 Difficulty in walking, not elsewhere classified: Secondary | ICD-10-CM

## 2023-02-02 DIAGNOSIS — M545 Low back pain, unspecified: Secondary | ICD-10-CM

## 2023-02-02 DIAGNOSIS — R278 Other lack of coordination: Secondary | ICD-10-CM

## 2023-02-02 NOTE — Therapy (Signed)
OUTPATIENT PHYSICAL THERAPY TREATMENT   Patient Name: Kendra Benton MRN: [id number] DOB:February 18, 1941, 82 y.o., female Today's Date: 02/02/2023  END OF SESSION:  PT End of Session - 02/02/23 1626     Visit Number 24    Number of Visits 38   date corrected to reflect most recent recert   Date for PT Re-Evaluation 02/27/23    Authorization Type [employer] primary; [place] medicaid secondary    Authorization Time Period 09/11/22-12/04/22    Progress Note Due on Visit 30    PT Start Time 1620    PT Stop Time 1700    PT Time Calculation (min) 40 min    Equipment Utilized During Treatment Gait belt    Activity Tolerance Patient tolerated treatment well;No increased pain    Behavior During Therapy WFL for tasks assessed/performed                       Past Medical History:  Diagnosis Date   Allergy    Anxiety    GERD (gastroesophageal reflux disease)    Headache    History of kidney stones    Hypertension    Myocardial infarction (HCC)    1997   Osteoporosis    Pneumonia    Past Surgical History:  Procedure Laterality Date   ABDOMINAL HYSTERECTOMY     patient has ovaries   APPENDECTOMY     ARTERY BIOPSY Right 12/30/2017   Procedure: BIOPSY TEMPORAL ARTERY;  Surgeon: Renford Dills, MD;  Location: [facility];  Service: Vascular;  Laterality: Right;   ARTERY BIOPSY Left 06/18/2018   Procedure: BIOPSY TEMPORAL ARTERY;  Surgeon: Renford Dills, MD;  Location: [facility];  Service: Vascular;  Laterality: Left;   BACK SURGERY     CARDIAC CATHETERIZATION     CATARACT EXTRACTION W/ INTRAOCULAR LENS  IMPLANT, BILATERAL Bilateral 2010   CHOLECYSTECTOMY N/A 02/28/2016   Procedure: LAPAROSCOPIC CHOLECYSTECTOMY WITH INTRAOPERATIVE CHOLANGIOGRAM;  Surgeon: Tiney Rouge III, MD;  Location: [facility];  Service: General;  Laterality: N/A;   LUMBAR FUSION  11/09/2016   L3-L5   SPINE SURGERY     Disc   TONSILLECTOMY     Patient Active Problem List   Diagnosis Date Noted   Fall  (on) (from) other stairs and steps, initial encounter 08/06/2020   Lumbar facet joint syndrome 06/18/2020   Spondylosis without myelopathy or radiculopathy, lumbosacral region 06/18/2020   DDD (degenerative disc disease), lumbosacral 06/18/2020   COVID 06/04/2020   Chronic pain syndrome 05/16/2020   Pharmacologic therapy 05/16/2020   Disorder of skeletal system 05/16/2020   Problems influencing health status 05/16/2020   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   Chronic hip pain (2ry area of Pain) (Bilateral) (R>L) 05/16/2020   Chronic lower extremity pain (Left) 05/16/2020   Numbness and tingling of both feet (intermittent/recurrent) 05/16/2020   Pain in rib (Bilateral) (R>L) (intermittent) 05/16/2020   Chronic knee pain (Left) 05/16/2020   Patellar pain (Left) (chronic, intermittent) 05/16/2020   Hard of hearing 05/16/2020   Carcinoma, lung, 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/25/2019   S/P partial lobectomy of lung 02/25/2019   Mass of upper lobe of right lung 01/06/2019   Chronic nonintractable headache 06/14/2018   Head ache 12/03/2017   Sinus bradycardia 10/06/2017   Chronic low back pain (1ry area of Pain) (Bilateral) (L>R) w/o sciatica 08/17/2017   Panic attack as reaction to stress 08/17/2017   Anxiety 06/15/2017   Atypical chest pain 06/15/2017   Hyperlipidemia 06/15/2017  Cholecystitis with cholelithiasis 02/27/2016   Increased frequency of urination 01/27/2015   History of night sweats 05/01/2014   Dupuytren's contracture 07/18/2013   Inverted nipple 04/18/2013   Depression 04/23/2010   Fibromyalgia 04/23/2010   Hypercholesterolemia 04/23/2010   Essential hypertension 04/23/2010   Hypothyroidism 04/23/2010   Irritable colon 04/23/2010   Osteoarthritis 04/23/2010    PCP: Cain Sieve, MD  REFERRING PROVIDER: Cain Sieve, MD  REFERRING DIAG: Chronic low back pain; At risk for falls  Rationale for Evaluation and Treatment: Rehabilitation  THERAPY DIAG:   Abnormality of gait and mobility  Difficulty in walking, not elsewhere classified  Other abnormalities of gait and mobility  Chronic bilateral low back pain without sciatica  Muscle weakness (generalized)  Other lack of coordination  Unsteadiness on feet  Chronic left-sided low back pain without sciatica  ONSET DATE: chronic but worse since Jan 2024  SUBJECTIVE:                                                                                                                                                                                           SUBJECTIVE STATEMENT:     PERTINENT HISTORY:  Patient is a 82 year old female with diagnosis of chronic low back pain with scoliosis and degenerative lumbar spine. She has received PT in the past for condition with positive results and currently using pain management strategies including pain meds, Heat, Exercises, Yoga, lidocain patch, diclofenac topical gel, and biofreeze. She was HTN controlled with medication.  She reports still lives alone but having trouble with community and social outings and having to modify how she performs her ADLs and housework due to her chronic pain. PMH- Spondylosis, Scoliosis, HTN, Lung CA.  PAIN:  Are you having pain? Yes 6/10 across low back, midline  PRECAUTIONS: Fall  WEIGHT BEARING RESTRICTIONS: No  FALLS:  Has patient fallen in last 6 months? No- However patient reports some balance issues and near falls- using cane with community outings  PLOF: Independent  PATIENT GOALS: To decrease my pain  NEXT MD VISIT: unknown   OBJECTIVE:   TODAY'S TREATMENT:  DATE:  02/02/23        TE Standing QL kettlebell 15# x 15 reps each Side Tall kneeling ER/IR x 15 reps each LE Tall kneeling hip abd x 12 reps each LE Sit to stand with UE raises x 10 reps   NMR:   Step tap  from both feet standing on airex pad - step up onto  6" step without UE Support x 20 reps alt LE Static stand (1 foot on airex pad and opp LE on 6" step) with horizontal/vertical head motions x several min each- Initial LOB yet did improve with practice.  Static single leg stand with opp LE on top of small red ball- rolling underneath foot - forward and backward  x several bouts        PATIENT EDUCATION:  Education details: PT plan of care; Exercise technique  Person educated: Patient Education method: Explanation, Demonstration, Tactile cues, and Verbal cues Education comprehension: verbalized understanding, returned demonstration, verbal cues required, and tactile cues required  HOME EXERCISE PROGRAM: 11/14/22:  Side step c red band around knees, back against wall   Access Code: [email or web address] URL: [email or web address] Date: 10/13/2022 Prepared by: Maureen Ralphs  Exercises - Standing Quadratus Lumborum Stretch with Doorway  - 1 x daily - 7 x weekly - 3 sets - 20-30 sec hold - Standing Quadratus Lumborum Mobilization with Small Ball on Wall  - 1 x daily - 7 x weekly - 3 sets - 10 reps - Supine Quadratus Lumborum Stretch  - 1 x daily - 7 x weekly - 3 sets - 20-30 hold - 90/90 SI Joint Self-Correction with Dowel  - 1 x daily - 7 x weekly - 3 sets - 10 reps - Hooklying Isometric Hip Abduction with Belt  - 1 x daily - 7 x weekly - 3 sets - 10 reps - 5 hold    Access Code: [email or web address] URL: [email or web address] Date: 10/09/2022 Prepared by: Maureen Ralphs  Exercises - Standing Quadratus Lumborum Stretch with Doorway  - 1 x daily - 7 x weekly - 3 sets - 20-30 sec hold - Standing Quadratus Lumborum Mobilization with Small Ball on Wall  - 1 x daily - 7 x weekly - 3 sets - 10 reps - Supine Quadratus Lumborum Stretch  - 1 x daily - 7 x weekly - 3 sets - 20-30 hold  ASSESSMENT:  CLINICAL IMPRESSION:  Patient presents with good motivation today - able  to progress with some single leg balance and resistive therex without complaint of any increased low back symptoms. She was responsive to all VC in tall kneeling and no difficulty getting up/down off floor mat today. Pt will continue to benefit from skilled physical therapy intervention to address impairments, improve QOL, and attain therapy goals.       OBJECTIVE IMPAIRMENTS: Abnormal gait, decreased activity tolerance, decreased balance, decreased endurance, decreased mobility, difficulty walking, decreased strength, and pain.   ACTIVITY LIMITATIONS: carrying, lifting, bending, sitting, standing, squatting, sleeping, stairs, transfers, and bed mobility  PARTICIPATION LIMITATIONS: cleaning, laundry, shopping, community activity, and yard work  PERSONAL FACTORS: Age, Time since onset of injury/illness/exacerbation, and 3+ comorbidities: scoliosis, HTN, degenerative spine  are also affecting patient's functional outcome.   REHAB POTENTIAL: Good  CLINICAL DECISION MAKING: Stable/uncomplicated  EVALUATION COMPLEXITY: Moderate   GOALS: Goals reviewed with patient? Yes  SHORT TERM GOALS: Target date: 10/24/2022  Pt will decrease worst back pain as reported on NPRS by at least 2 points in order to demonstrate clinically significant reduction  in back pain.  Baseline: Eval= 8/10; 11/17/2022= 4/10 current and up to 7-8/10 at worst.  Goal status: MET  LONG TERM GOALS: Target date: 02/27/2023  Pt will be independent with Final HEP in order to improve strength and decrease back pain in order to improve pain-free function at home and work.  Baseline: EVAL= Patient reports needs update and review of exercises to assist with her back pain; 11/17/2022- Patient performing mostly self stretching and yoga based exercises and will continue to benefit from adding to comprehensive HEP; 12/05/2022- HEP ongoing focusing more on Lumbar strengthening Goal status: ONGOING  2.  Pt will decrease worst back pain as  reported on NPRS by at least 3 points in order to demonstrate clinically significant reduction in back pain.  Baseline: EVAL= 8/10; 11/17/2022= 4/10 current and up to 7-8/10 at worst. 12/05/2022= 4/10 current- and up to 6/10 depending on activity 01/12/23: 4/10 and is consistently at this level.  Goal status: ONGOING  3.  Pt will decrease MODI score by at least 13 points in order demonstrate clinically significant reduction in back pain/disability.   Baseline: EVAL=58%; 11/17/2022= 40%;12/05/2022= 44% 01/12/23:38% Goal status: MET  4.  Pt will improve FOTO to target score of >55 to display perceived improvements in ability to complete ADL's.  Baseline: EVAL= 54; 11/14/22: 56; 12/05/2022=63 Goal status: MET  5.  Patient (> 93 years old) will complete five times sit to stand test in <15 seconds indicating an increased LE strength and improved balance.  Baseline: EVAL = 20.33; 11/17/2022= 16.55 sec without UE Support; 12/05/2022= 16.47 sec without UE support 01/12/23: 15.5 sec  Goal status: PROGRESSING  6.  Pt will increase by at least 0.13 m/s in order to demonstrate clinically significant improvement in community ambulation.   Baseline: EVAL = 0.97 m/s; 6/10=0.98 m/s; 12/05/2022= 0.98 m/s 8/5:1.08 m/s  Goal status: PROGRESSING  7.  Patient will report ability to complete > or equal to 60 min grocery store outing or similar community activiities to return to her previous level of function.  Baseline: EVAL: patient reports difficulty tolerating outings in community. 12/05/2022= Patient reports up to around 40 min of continuous community activities.  01/12/23: last time she went she was up for an hour and used cart and had no issues, has some issues with heat but that has been goingn on for 20 years per pt .  Goal status: MET    8. Pt will improve BERG by at least 3 points in order to demonstrate clinically significant improvement in balance.    Baseline: 09/30/2022= 47/56; 12/05/2022= 53/56  Goal status:  MET  9. Pt will increase by at least 32m (17ft) in order to demonstrate clinically significant improvement in cardiopulmonary endurance and community ambulation   Baseline: 09/23/2022= use of SPC RUE (pt preference): starting pain 4/10, then 5/10 at 385ft, and 6/10 at 569ft: audible RR dyspnea. Test terminated after 663ft, 3 minutes 15 seconds; 11/17/2022= 920 feet in 5 min 37 sec c/o 4/10 LBP and some DOE- O2 sat unable to read due to cold fingers- Respirations= 23/min; 12/05/2022= 1050 feet in 6 min  01/12/23: 1030 ft Spo2 95% post bout  Goal status: MET  PLAN:  PT FREQUENCY: 1-2x/week  PT DURATION: 12 weeks  PLANNED INTERVENTIONS: Therapeutic exercises, Therapeutic activity, Neuromuscular re-education, Balance training, Gait training, Patient/Family education, Self Care, Joint mobilization, Joint manipulation, Stair training, Vestibular training, Canalith repositioning, Orthotic/Fit training, DME instructions, Dry Needling, Electrical stimulation, Spinal manipulation, Spinal mobilization, Cryotherapy, Moist heat,  scar mobilization, Taping, and Manual therapy.  PLAN FOR NEXT SESSION: Continue to monitor vitals to add data for BP values. Functional LE strengthening and core strength, dynamic balance training.    5:08 PM, 02/02/23    Lenda Kelp, PT 02/02/2023, 5:08 PM   5:08 PM, 02/02/23 Physical Therapist - [facility]  Outpatient Physical Therapy- [facility] [phone]    5:08 PM, 02/02/23

## 2023-02-04 ENCOUNTER — Ambulatory Visit: Payer: Medicare HMO | Admitting: Physical Therapy

## 2023-02-04 DIAGNOSIS — R2681 Unsteadiness on feet: Secondary | ICD-10-CM

## 2023-02-04 DIAGNOSIS — R269 Unspecified abnormalities of gait and mobility: Secondary | ICD-10-CM | POA: Diagnosis not present

## 2023-02-04 DIAGNOSIS — M6281 Muscle weakness (generalized): Secondary | ICD-10-CM

## 2023-02-04 DIAGNOSIS — R262 Difficulty in walking, not elsewhere classified: Secondary | ICD-10-CM

## 2023-02-04 DIAGNOSIS — R278 Other lack of coordination: Secondary | ICD-10-CM

## 2023-02-04 DIAGNOSIS — R2689 Other abnormalities of gait and mobility: Secondary | ICD-10-CM

## 2023-02-04 NOTE — Therapy (Signed)
OUTPATIENT PHYSICAL THERAPY TREATMENT   Patient Name: Kendra Benton MRN: [id number] DOB:January 29, 1941, 82 y.o., female Today's Date: 02/04/2023  END OF SESSION:  PT End of Session - 02/04/23 1617     Visit Number 25    Number of Visits 38   date corrected to reflect most recent recert   Date for PT Re-Evaluation 02/27/23    Authorization Type [employer] primary; [place] medicaid secondary    Authorization Time Period 09/11/22-12/04/22    Progress Note Due on Visit 30    PT Start Time 1617    PT Stop Time 1700    PT Time Calculation (min) 43 min    Equipment Utilized During Treatment Gait belt    Activity Tolerance Patient tolerated treatment well;No increased pain    Behavior During Therapy WFL for tasks assessed/performed                       Past Medical History:  Diagnosis Date   Allergy    Anxiety    GERD (gastroesophageal reflux disease)    Headache    History of kidney stones    Hypertension    Myocardial infarction (HCC)    1997   Osteoporosis    Pneumonia    Past Surgical History:  Procedure Laterality Date   ABDOMINAL HYSTERECTOMY     patient has ovaries   APPENDECTOMY     ARTERY BIOPSY Right 12/30/2017   Procedure: BIOPSY TEMPORAL ARTERY;  Surgeon: Renford Dills, MD;  Location: [facility];  Service: Vascular;  Laterality: Right;   ARTERY BIOPSY Left 06/18/2018   Procedure: BIOPSY TEMPORAL ARTERY;  Surgeon: Renford Dills, MD;  Location: [facility];  Service: Vascular;  Laterality: Left;   BACK SURGERY     CARDIAC CATHETERIZATION     CATARACT EXTRACTION W/ INTRAOCULAR LENS  IMPLANT, BILATERAL Bilateral 2010   CHOLECYSTECTOMY N/A 02/28/2016   Procedure: LAPAROSCOPIC CHOLECYSTECTOMY WITH INTRAOPERATIVE CHOLANGIOGRAM;  Surgeon: Tiney Rouge III, MD;  Location: [facility];  Service: General;  Laterality: N/A;   LUMBAR FUSION  11/09/2016   L3-L5   SPINE SURGERY     Disc   TONSILLECTOMY     Patient Active Problem List   Diagnosis Date Noted   Fall  (on) (from) other stairs and steps, initial encounter 08/06/2020   Lumbar facet joint syndrome 06/18/2020   Spondylosis without myelopathy or radiculopathy, lumbosacral region 06/18/2020   DDD (degenerative disc disease), lumbosacral 06/18/2020   COVID 06/04/2020   Chronic pain syndrome 05/16/2020   Pharmacologic therapy 05/16/2020   Disorder of skeletal system 05/16/2020   Problems influencing health status 05/16/2020   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   Chronic hip pain (2ry area of Pain) (Bilateral) (R>L) 05/16/2020   Chronic lower extremity pain (Left) 05/16/2020   Numbness and tingling of both feet (intermittent/recurrent) 05/16/2020   Pain in rib (Bilateral) (R>L) (intermittent) 05/16/2020   Chronic knee pain (Left) 05/16/2020   Patellar pain (Left) (chronic, intermittent) 05/16/2020   Hard of hearing 05/16/2020   Carcinoma, lung, 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/25/2019   S/P partial lobectomy of lung 02/25/2019   Mass of upper lobe of right lung 01/06/2019   Chronic nonintractable headache 06/14/2018   Head ache 12/03/2017   Sinus bradycardia 10/06/2017   Chronic low back pain (1ry area of Pain) (Bilateral) (L>R) w/o sciatica 08/17/2017   Panic attack as reaction to stress 08/17/2017   Anxiety 06/15/2017   Atypical chest pain 06/15/2017   Hyperlipidemia 06/15/2017  Cholecystitis with cholelithiasis 02/27/2016   Increased frequency of urination 01/27/2015   History of night sweats 05/01/2014   Dupuytren's contracture 07/18/2013   Inverted nipple 04/18/2013   Depression 04/23/2010   Fibromyalgia 04/23/2010   Hypercholesterolemia 04/23/2010   Essential hypertension 04/23/2010   Hypothyroidism 04/23/2010   Irritable colon 04/23/2010   Osteoarthritis 04/23/2010    PCP: Cain Sieve, MD  REFERRING PROVIDER: Cain Sieve, MD  REFERRING DIAG: Chronic low back pain; At risk for falls  Rationale for Evaluation and Treatment: Rehabilitation  THERAPY DIAG:   Muscle weakness (generalized)  Abnormality of gait and mobility  Difficulty in walking, not elsewhere classified  Other lack of coordination  Other abnormalities of gait and mobility  Unsteadiness on feet  ONSET DATE: chronic but worse since Jan 2024  SUBJECTIVE:                                                                                                                                                                                           SUBJECTIVE STATEMENT:     PERTINENT HISTORY:  Patient is a 82 year old female with diagnosis of chronic low back pain with scoliosis and degenerative lumbar spine. She has received PT in the past for condition with positive results and currently using pain management strategies including pain meds, Heat, Exercises, Yoga, lidocain patch, diclofenac topical gel, and biofreeze. She was HTN controlled with medication.  She reports still lives alone but having trouble with community and social outings and having to modify how she performs her ADLs and housework due to her chronic pain. PMH- Spondylosis, Scoliosis, HTN, Lung CA.  PAIN:  Are you having pain? Yes 6/10 across low back, midline  PRECAUTIONS: Fall  WEIGHT BEARING RESTRICTIONS: No  FALLS:  Has patient fallen in last 6 months? No- However patient reports some balance issues and near falls- using cane with community outings  PLOF: Independent  PATIENT GOALS: To decrease my pain  NEXT MD VISIT: unknown   OBJECTIVE:   TODAY'S TREATMENT:  DATE:  02/04/23      BP assessed at start of PT session;  Seated: 140/57 HR 78 Standing: 118/55 HR 81 mild orthostatic s/s for ~15 sec Standing1 min : 139/66 HR 82 no orthostatic s/s   Gait without resistance x 331ft with supervision assist/CGA; gait with 3# ankle weights x 360ft with CGA to prevent lateral LOB in  turns. Mild SOB with weighted gait training.   NMR:  Obstacle course navigation to step over bolsters in floor, across airex beam, up/down 6 inch step, standing on airex pad.  Standing on airex pad:  Weight shift R and L with 3 sec hold x 10 bil  Narrow BOS 30sec  x2   TE Standing QL kettlebell 15# x 10 reps each Side Sit to stand without UE x 10 reps Modified dead lift to pick up kettle bell from 18 inches x 8 and from 12 inches x 8   CGA from PT for safety for all NMR and standing therex throughout session. Tactile cues for improved lateral and AP control on airex pad to prevent LOB.      PATIENT EDUCATION:  Education details: PT plan of care; Exercise technique  Person educated: Patient Education method: Explanation, Demonstration, Tactile cues, and Verbal cues Education comprehension: verbalized understanding, returned demonstration, verbal cues required, and tactile cues required  HOME EXERCISE PROGRAM: 11/14/22:  Side step c red band around knees, back against wall   Access Code: [email or web address] URL: [email or web address] Date: 10/13/2022 Prepared by: Maureen Ralphs  Exercises - Standing Quadratus Lumborum Stretch with Doorway  - 1 x daily - 7 x weekly - 3 sets - 20-30 sec hold - Standing Quadratus Lumborum Mobilization with Small Ball on Wall  - 1 x daily - 7 x weekly - 3 sets - 10 reps - Supine Quadratus Lumborum Stretch  - 1 x daily - 7 x weekly - 3 sets - 20-30 hold - 90/90 SI Joint Self-Correction with Dowel  - 1 x daily - 7 x weekly - 3 sets - 10 reps - Hooklying Isometric Hip Abduction with Belt  - 1 x daily - 7 x weekly - 3 sets - 10 reps - 5 hold    Access Code: [email or web address] URL: [email or web address] Date: 10/09/2022 Prepared by: Maureen Ralphs  Exercises - Standing Quadratus Lumborum Stretch with Doorway  - 1 x daily - 7 x weekly - 3 sets - 20-30 sec hold - Standing Quadratus Lumborum Mobilization with Small Ball on Wall  - 1 x  daily - 7 x weekly - 3 sets - 10 reps - Supine Quadratus Lumborum Stretch  - 1 x daily - 7 x weekly - 3 sets - 20-30 hold  ASSESSMENT:  CLINICAL IMPRESSION:  Patient presents with good motivation today. Able to demonstrate improving use of ankle stability and use of ankle strategy to prevent LOB on compliant surface. Pt reports mild back stiffness upon completion of TE.  Pt will continue to benefit from skilled physical therapy intervention to address impairments, improve QOL, and attain therapy goals.       OBJECTIVE IMPAIRMENTS: Abnormal gait, decreased activity tolerance, decreased balance, decreased endurance, decreased mobility, difficulty walking, decreased strength, and pain.   ACTIVITY LIMITATIONS: carrying, lifting, bending, sitting, standing, squatting, sleeping, stairs, transfers, and bed mobility  PARTICIPATION LIMITATIONS: cleaning, laundry, shopping, community activity, and yard work  PERSONAL FACTORS: Age, Time since onset of injury/illness/exacerbation, and 3+ comorbidities: scoliosis, HTN, degenerative spine  are also affecting patient's functional outcome.   REHAB POTENTIAL: Good  CLINICAL DECISION MAKING: Stable/uncomplicated  EVALUATION COMPLEXITY: Moderate   GOALS: Goals reviewed with patient? Yes  SHORT TERM GOALS: Target date: 10/24/2022  Pt will decrease worst back pain as reported on NPRS by at least 2 points in order to demonstrate clinically significant reduction in back pain.  Baseline: Eval= 8/10; 11/17/2022= 4/10 current and up to 7-8/10 at worst.  Goal status: MET  LONG TERM GOALS: Target date: 02/27/2023  Pt will be independent with Final HEP in order to improve strength and decrease back pain in order to improve pain-free function at home and work.  Baseline: EVAL= Patient reports needs update and review of exercises to assist with her back pain; 11/17/2022- Patient performing mostly self stretching and yoga based exercises and will continue to  benefit from adding to comprehensive HEP; 12/05/2022- HEP ongoing focusing more on Lumbar strengthening Goal status: ONGOING  2.  Pt will decrease worst back pain as reported on NPRS by at least 3 points in order to demonstrate clinically significant reduction in back pain.  Baseline: EVAL= 8/10; 11/17/2022= 4/10 current and up to 7-8/10 at worst. 12/05/2022= 4/10 current- and up to 6/10 depending on activity 01/12/23: 4/10 and is consistently at this level.  Goal status: ONGOING  3.  Pt will decrease MODI score by at least 13 points in order demonstrate clinically significant reduction in back pain/disability.   Baseline: EVAL=58%; 11/17/2022= 40%;12/05/2022= 44% 01/12/23:38% Goal status: MET  4.  Pt will improve FOTO to target score of >55 to display perceived improvements in ability to complete ADL's.  Baseline: EVAL= 54; 11/14/22: 56; 12/05/2022=63 Goal status: MET  5.  Patient (> 16 years old) will complete five times sit to stand test in <15 seconds indicating an increased LE strength and improved balance.  Baseline: EVAL = 20.33; 11/17/2022= 16.55 sec without UE Support; 12/05/2022= 16.47 sec without UE support 01/12/23: 15.5 sec  Goal status: PROGRESSING  6.  Pt will increase by at least 0.13 m/s in order to demonstrate clinically significant improvement in community ambulation.   Baseline: EVAL = 0.97 m/s; 6/10=0.98 m/s; 12/05/2022= 0.98 m/s 8/5:1.08 m/s  Goal status: PROGRESSING  7.  Patient will report ability to complete > or equal to 60 min grocery store outing or similar community activiities to return to her previous level of function.  Baseline: EVAL: patient reports difficulty tolerating outings in community. 12/05/2022= Patient reports up to around 40 min of continuous community activities.  01/12/23: last time she went she was up for an hour and used cart and had no issues, has some issues with heat but that has been goingn on for 20 years per pt .  Goal status: MET    8. Pt will  improve BERG by at least 3 points in order to demonstrate clinically significant improvement in balance.    Baseline: 09/30/2022= 47/56; 12/05/2022= 53/56  Goal status: MET  9. Pt will increase by at least 52m (173ft) in order to demonstrate clinically significant improvement in cardiopulmonary endurance and community ambulation   Baseline: 09/23/2022= use of SPC RUE (pt preference): starting pain 4/10, then 5/10 at 310ft, and 6/10 at 582ft: audible RR dyspnea. Test terminated after 645ft, 3 minutes 15 seconds; 11/17/2022= 920 feet in 5 min 37 sec c/o 4/10 LBP and some DOE- O2 sat unable to read due to cold fingers- Respirations= 23/min; 12/05/2022= 1050 feet in 6 min  01/12/23: 1030 ft Spo2 95% post bout  Goal status: MET  PLAN:  PT FREQUENCY: 1-2x/week  PT DURATION: 12 weeks  PLANNED INTERVENTIONS: Therapeutic exercises, Therapeutic activity, Neuromuscular re-education, Balance training, Gait training, Patient/Family education, Self Care, Joint mobilization, Joint manipulation, Stair training, Vestibular training, Canalith repositioning, Orthotic/Fit training, DME instructions, Dry Needling, Electrical stimulation, Spinal manipulation, Spinal mobilization, Cryotherapy, Moist heat, scar mobilization, Taping, and Manual therapy.  PLAN FOR NEXT SESSION:    Continue to monitor vitals to add data for BP values.  Functional LE strengthening and core strength, dynamic balance training.      Golden Pop, PT 02/04/2023, 5:17 PM   5:17 PM, 02/04/23 Physical Therapist - [facility]  Outpatient Physical Therapy- [facility] [phone]    5:17 PM, 02/04/23

## 2023-02-11 ENCOUNTER — Ambulatory Visit: Payer: Medicare HMO

## 2023-02-16 ENCOUNTER — Ambulatory Visit: Payer: Medicare HMO | Attending: Internal Medicine

## 2023-02-16 DIAGNOSIS — R2689 Other abnormalities of gait and mobility: Secondary | ICD-10-CM | POA: Insufficient documentation

## 2023-02-16 DIAGNOSIS — R2681 Unsteadiness on feet: Secondary | ICD-10-CM | POA: Insufficient documentation

## 2023-02-16 DIAGNOSIS — M6281 Muscle weakness (generalized): Secondary | ICD-10-CM | POA: Diagnosis present

## 2023-02-16 DIAGNOSIS — R269 Unspecified abnormalities of gait and mobility: Secondary | ICD-10-CM | POA: Diagnosis present

## 2023-02-16 DIAGNOSIS — R262 Difficulty in walking, not elsewhere classified: Secondary | ICD-10-CM | POA: Diagnosis present

## 2023-02-16 DIAGNOSIS — G8929 Other chronic pain: Secondary | ICD-10-CM | POA: Insufficient documentation

## 2023-02-16 DIAGNOSIS — R278 Other lack of coordination: Secondary | ICD-10-CM | POA: Diagnosis present

## 2023-02-16 DIAGNOSIS — M545 Low back pain, unspecified: Secondary | ICD-10-CM | POA: Insufficient documentation

## 2023-02-16 NOTE — Therapy (Signed)
OUTPATIENT PHYSICAL THERAPY TREATMENT   Patient Name: Kendra Benton MRN: [id number] DOB:1941-01-31, 82 y.o., female Today's Date: 02/16/2023  END OF SESSION:  PT End of Session - 02/16/23 1638     Visit Number 26    Number of Visits 38   date corrected to reflect most recent recert   Date for PT Re-Evaluation 02/27/23    Authorization Type [employer] primary; [place] medicaid secondary    Authorization Time Period 09/11/22-12/04/22    Progress Note Due on Visit 30    PT Start Time 1616    Equipment Utilized During Treatment Gait belt    Activity Tolerance Patient tolerated treatment well;No increased pain    Behavior During Therapy WFL for tasks assessed/performed                       Past Medical History:  Diagnosis Date   Allergy    Anxiety    GERD (gastroesophageal reflux disease)    Headache    History of kidney stones    Hypertension    Myocardial infarction (HCC)    1997   Osteoporosis    Pneumonia    Past Surgical History:  Procedure Laterality Date   ABDOMINAL HYSTERECTOMY     patient has ovaries   APPENDECTOMY     ARTERY BIOPSY Right 12/30/2017   Procedure: BIOPSY TEMPORAL ARTERY;  Surgeon: Renford Dills, MD;  Location: [facility];  Service: Vascular;  Laterality: Right;   ARTERY BIOPSY Left 06/18/2018   Procedure: BIOPSY TEMPORAL ARTERY;  Surgeon: Renford Dills, MD;  Location: [facility];  Service: Vascular;  Laterality: Left;   BACK SURGERY     CARDIAC CATHETERIZATION     CATARACT EXTRACTION W/ INTRAOCULAR LENS  IMPLANT, BILATERAL Bilateral 2010   CHOLECYSTECTOMY N/A 02/28/2016   Procedure: LAPAROSCOPIC CHOLECYSTECTOMY WITH INTRAOPERATIVE CHOLANGIOGRAM;  Surgeon: Tiney Rouge III, MD;  Location: [facility];  Service: General;  Laterality: N/A;   LUMBAR FUSION  11/09/2016   L3-L5   SPINE SURGERY     Disc   TONSILLECTOMY     Patient Active Problem List   Diagnosis Date Noted   Fall (on) (from) other stairs and steps, initial encounter  08/06/2020   Lumbar facet joint syndrome 06/18/2020   Spondylosis without myelopathy or radiculopathy, lumbosacral region 06/18/2020   DDD (degenerative disc disease), lumbosacral 06/18/2020   COVID 06/04/2020   Chronic pain syndrome 05/16/2020   Pharmacologic therapy 05/16/2020   Disorder of skeletal system 05/16/2020   Problems influencing health status 05/16/2020   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   Chronic hip pain (2ry area of Pain) (Bilateral) (R>L) 05/16/2020   Chronic lower extremity pain (Left) 05/16/2020   Numbness and tingling of both feet (intermittent/recurrent) 05/16/2020   Pain in rib (Bilateral) (R>L) (intermittent) 05/16/2020   Chronic knee pain (Left) 05/16/2020   Patellar pain (Left) (chronic, intermittent) 05/16/2020   Hard of hearing 05/16/2020   Carcinoma, lung, 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/25/2019   S/P partial lobectomy of lung 02/25/2019   Mass of upper lobe of right lung 01/06/2019   Chronic nonintractable headache 06/14/2018   Head ache 12/03/2017   Sinus bradycardia 10/06/2017   Chronic low back pain (1ry area of Pain) (Bilateral) (L>R) w/o sciatica 08/17/2017   Panic attack as reaction to stress 08/17/2017   Anxiety 06/15/2017   Atypical chest pain 06/15/2017   Hyperlipidemia 06/15/2017   Cholecystitis with cholelithiasis 02/27/2016   Increased frequency of urination 01/27/2015   History of night  sweats 05/01/2014   Dupuytren's contracture 07/18/2013   Inverted nipple 04/18/2013   Depression 04/23/2010   Fibromyalgia 04/23/2010   Hypercholesterolemia 04/23/2010   Essential hypertension 04/23/2010   Hypothyroidism 04/23/2010   Irritable colon 04/23/2010   Osteoarthritis 04/23/2010    PCP: Cain Sieve, MD  REFERRING PROVIDER: Cain Sieve, MD  REFERRING DIAG: Chronic low back pain; At risk for falls  Rationale for Evaluation and Treatment: Rehabilitation  THERAPY DIAG:  Muscle weakness (generalized)  Abnormality of gait and  mobility  Difficulty in walking, not elsewhere classified  Other lack of coordination  Other abnormalities of gait and mobility  Unsteadiness on feet  Chronic bilateral low back pain without sciatica  ONSET DATE: chronic but worse since Jan 2024  SUBJECTIVE:                                                                                                                                                                                           SUBJECTIVE STATEMENT:     PERTINENT HISTORY:  Patient is a 82 year old female with diagnosis of chronic low back pain with scoliosis and degenerative lumbar spine. She has received PT in the past for condition with positive results and currently using pain management strategies including pain meds, Heat, Exercises, Yoga, lidocain patch, diclofenac topical gel, and biofreeze. She was HTN controlled with medication.  She reports still lives alone but having trouble with community and social outings and having to modify how she performs her ADLs and housework due to her chronic pain. PMH- Spondylosis, Scoliosis, HTN, Lung CA.  PAIN:  Are you having pain? Yes 6/10 across low back, midline  PRECAUTIONS: Fall  WEIGHT BEARING RESTRICTIONS: No  FALLS:  Has patient fallen in last 6 months? No- However patient reports some balance issues and near falls- using cane with community outings  PLOF: Independent  PATIENT GOALS: To decrease my pain  NEXT MD VISIT: unknown   OBJECTIVE:   TODAY'S TREATMENT:  DATE:  02/16/23       NMR:  Dynamic high knee march Toe walk Heel walk Step up with left then reciprocal step up to 2nd step with right then eccentric control of left quad to all slow return of right LE to floor.   THEREX:  Standing hip hike (on edge of 6" step stool)  Standing hip flex+ER  CGA from PT for safety for  all NMR and standing therex throughout session. Tactile cues for improved lateral and AP control on airex pad to prevent LOB.      PATIENT EDUCATION:  Education details: PT plan of care; Exercise technique  Person educated: Patient Education method: Explanation, Demonstration, Tactile cues, and Verbal cues Education comprehension: verbalized understanding, returned demonstration, verbal cues required, and tactile cues required  HOME EXERCISE PROGRAM: 11/14/22:  Side step c red band around knees, back against wall   Access Code: [email or web address] URL: [email or web address] Date: 10/13/2022 Prepared by: Maureen Ralphs  Exercises - Standing Quadratus Lumborum Stretch with Doorway  - 1 x daily - 7 x weekly - 3 sets - 20-30 sec hold - Standing Quadratus Lumborum Mobilization with Small Ball on Wall  - 1 x daily - 7 x weekly - 3 sets - 10 reps - Supine Quadratus Lumborum Stretch  - 1 x daily - 7 x weekly - 3 sets - 20-30 hold - 90/90 SI Joint Self-Correction with Dowel  - 1 x daily - 7 x weekly - 3 sets - 10 reps - Hooklying Isometric Hip Abduction with Belt  - 1 x daily - 7 x weekly - 3 sets - 10 reps - 5 hold    Access Code: [email or web address] URL: [email or web address] Date: 10/09/2022 Prepared by: Maureen Ralphs  Exercises - Standing Quadratus Lumborum Stretch with Doorway  - 1 x daily - 7 x weekly - 3 sets - 20-30 sec hold - Standing Quadratus Lumborum Mobilization with Small Ball on Wall  - 1 x daily - 7 x weekly - 3 sets - 10 reps - Supine Quadratus Lumborum Stretch  - 1 x daily - 7 x weekly - 3 sets - 20-30 hold  ASSESSMENT:  CLINICAL IMPRESSION:  Patient presents with good motivation today. Able to demonstrate improving use of ankle stability and use of ankle strategy to prevent LOB on compliant surface. Pt reports mild back stiffness upon completion of TE.  Pt will continue to benefit from skilled physical therapy intervention to address impairments,  improve QOL, and attain therapy goals.       OBJECTIVE IMPAIRMENTS: Abnormal gait, decreased activity tolerance, decreased balance, decreased endurance, decreased mobility, difficulty walking, decreased strength, and pain.   ACTIVITY LIMITATIONS: carrying, lifting, bending, sitting, standing, squatting, sleeping, stairs, transfers, and bed mobility  PARTICIPATION LIMITATIONS: cleaning, laundry, shopping, community activity, and yard work  PERSONAL FACTORS: Age, Time since onset of injury/illness/exacerbation, and 3+ comorbidities: scoliosis, HTN, degenerative spine  are also affecting patient's functional outcome.   REHAB POTENTIAL: Good  CLINICAL DECISION MAKING: Stable/uncomplicated  EVALUATION COMPLEXITY: Moderate   GOALS: Goals reviewed with patient? Yes  SHORT TERM GOALS: Target date: 10/24/2022  Pt will decrease worst back pain as reported on NPRS by at least 2 points in order to demonstrate clinically significant reduction in back pain.  Baseline: Eval= 8/10; 11/17/2022= 4/10 current and up to 7-8/10 at worst.  Goal status: MET  LONG TERM GOALS: Target date: 02/27/2023  Pt will be independent with Final HEP in order to improve strength and decrease back pain in order to  improve pain-free function at home and work.  Baseline: EVAL= Patient reports needs update and review of exercises to assist with her back pain; 11/17/2022- Patient performing mostly self stretching and yoga based exercises and will continue to benefit from adding to comprehensive HEP; 12/05/2022- HEP ongoing focusing more on Lumbar strengthening Goal status: ONGOING  2.  Pt will decrease worst back pain as reported on NPRS by at least 3 points in order to demonstrate clinically significant reduction in back pain.  Baseline: EVAL= 8/10; 11/17/2022= 4/10 current and up to 7-8/10 at worst. 12/05/2022= 4/10 current- and up to 6/10 depending on activity 01/12/23: 4/10 and is consistently at this level.  Goal status:  ONGOING  3.  Pt will decrease MODI score by at least 13 points in order demonstrate clinically significant reduction in back pain/disability.   Baseline: EVAL=58%; 11/17/2022= 40%;12/05/2022= 44% 01/12/23:38% Goal status: MET  4.  Pt will improve FOTO to target score of >55 to display perceived improvements in ability to complete ADL's.  Baseline: EVAL= 54; 11/14/22: 56; 12/05/2022=63 Goal status: MET  5.  Patient (> 101 years old) will complete five times sit to stand test in <15 seconds indicating an increased LE strength and improved balance.  Baseline: EVAL = 20.33; 11/17/2022= 16.55 sec without UE Support; 12/05/2022= 16.47 sec without UE support 01/12/23: 15.5 sec  Goal status: PROGRESSING  6.  Pt will increase by at least 0.13 m/s in order to demonstrate clinically significant improvement in community ambulation.   Baseline: EVAL = 0.97 m/s; 6/10=0.98 m/s; 12/05/2022= 0.98 m/s 8/5:1.08 m/s  Goal status: PROGRESSING  7.  Patient will report ability to complete > or equal to 60 min grocery store outing or similar community activiities to return to her previous level of function.  Baseline: EVAL: patient reports difficulty tolerating outings in community. 12/05/2022= Patient reports up to around 40 min of continuous community activities.  01/12/23: last time she went she was up for an hour and used cart and had no issues, has some issues with heat but that has been goingn on for 20 years per pt .  Goal status: MET    8. Pt will improve BERG by at least 3 points in order to demonstrate clinically significant improvement in balance.    Baseline: 09/30/2022= 47/56; 12/05/2022= 53/56  Goal status: MET  9. Pt will increase by at least 23m (117ft) in order to demonstrate clinically significant improvement in cardiopulmonary endurance and community ambulation   Baseline: 09/23/2022= use of SPC RUE (pt preference): starting pain 4/10, then 5/10 at 323ft, and 6/10 at 535ft: audible RR dyspnea. Test  terminated after 676ft, 3 minutes 15 seconds; 11/17/2022= 920 feet in 5 min 37 sec c/o 4/10 LBP and some DOE- O2 sat unable to read due to cold fingers- Respirations= 23/min; 12/05/2022= 1050 feet in 6 min  01/12/23: 1030 ft Spo2 95% post bout  Goal status: MET  PLAN:  PT FREQUENCY: 1-2x/week  PT DURATION: 12 weeks  PLANNED INTERVENTIONS: Therapeutic exercises, Therapeutic activity, Neuromuscular re-education, Balance training, Gait training, Patient/Family education, Self Care, Joint mobilization, Joint manipulation, Stair training, Vestibular training, Canalith repositioning, Orthotic/Fit training, DME instructions, Dry Needling, Electrical stimulation, Spinal manipulation, Spinal mobilization, Cryotherapy, Moist heat, scar mobilization, Taping, and Manual therapy.  PLAN FOR NEXT SESSION:    Continue to monitor vitals to add data for BP values.  Functional LE strengthening and core strength, dynamic balance training.      Lenda Kelp, PT 02/16/2023, 4:38 PM  4:38 PM, 02/16/23 Physical Therapist - [facility]  Outpatient Physical Therapy- [facility] [phone]    4:38 PM, 02/16/23

## 2023-02-18 ENCOUNTER — Ambulatory Visit: Payer: Medicare HMO

## 2023-02-18 DIAGNOSIS — M6281 Muscle weakness (generalized): Secondary | ICD-10-CM

## 2023-02-18 DIAGNOSIS — R262 Difficulty in walking, not elsewhere classified: Secondary | ICD-10-CM

## 2023-02-18 DIAGNOSIS — R2689 Other abnormalities of gait and mobility: Secondary | ICD-10-CM

## 2023-02-18 DIAGNOSIS — R2681 Unsteadiness on feet: Secondary | ICD-10-CM

## 2023-02-18 DIAGNOSIS — R269 Unspecified abnormalities of gait and mobility: Secondary | ICD-10-CM

## 2023-02-18 DIAGNOSIS — R278 Other lack of coordination: Secondary | ICD-10-CM

## 2023-02-18 NOTE — Therapy (Signed)
OUTPATIENT PHYSICAL THERAPY TREATMENT   Patient Name: Kendra Benton MRN: [id number] DOB:1941-02-27, 82 y.o., female Today's Date: 02/19/2023  END OF SESSION:  PT End of Session - 02/18/23 1628     Visit Number 27    Number of Visits 38   date corrected to reflect most recent recert   Date for PT Re-Evaluation 02/27/23    Authorization Type [employer] primary; [place] medicaid secondary    Authorization Time Period 09/11/22-12/04/22    Progress Note Due on Visit 30    PT Start Time 1615    PT Stop Time 1659    PT Time Calculation (min) 44 min    Equipment Utilized During Treatment Gait belt    Activity Tolerance Patient tolerated treatment well;No increased pain    Behavior During Therapy WFL for tasks assessed/performed                       Past Medical History:  Diagnosis Date   Allergy    Anxiety    GERD (gastroesophageal reflux disease)    Headache    History of kidney stones    Hypertension    Myocardial infarction (HCC)    1997   Osteoporosis    Pneumonia    Past Surgical History:  Procedure Laterality Date   ABDOMINAL HYSTERECTOMY     patient has ovaries   APPENDECTOMY     ARTERY BIOPSY Right 12/30/2017   Procedure: BIOPSY TEMPORAL ARTERY;  Surgeon: Renford Dills, MD;  Location: [facility];  Service: Vascular;  Laterality: Right;   ARTERY BIOPSY Left 06/18/2018   Procedure: BIOPSY TEMPORAL ARTERY;  Surgeon: Renford Dills, MD;  Location: [facility];  Service: Vascular;  Laterality: Left;   BACK SURGERY     CARDIAC CATHETERIZATION     CATARACT EXTRACTION W/ INTRAOCULAR LENS  IMPLANT, BILATERAL Bilateral 2010   CHOLECYSTECTOMY N/A 02/28/2016   Procedure: LAPAROSCOPIC CHOLECYSTECTOMY WITH INTRAOPERATIVE CHOLANGIOGRAM;  Surgeon: Tiney Rouge III, MD;  Location: [facility];  Service: General;  Laterality: N/A;   LUMBAR FUSION  11/09/2016   L3-L5   SPINE SURGERY     Disc   TONSILLECTOMY     Patient Active Problem List   Diagnosis Date Noted   Fall  (on) (from) other stairs and steps, initial encounter 08/06/2020   Lumbar facet joint syndrome 06/18/2020   Spondylosis without myelopathy or radiculopathy, lumbosacral region 06/18/2020   DDD (degenerative disc disease), lumbosacral 06/18/2020   COVID 06/04/2020   Chronic pain syndrome 05/16/2020   Pharmacologic therapy 05/16/2020   Disorder of skeletal system 05/16/2020   Problems influencing health status 05/16/2020   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   Chronic hip pain (2ry area of Pain) (Bilateral) (R>L) 05/16/2020   Chronic lower extremity pain (Left) 05/16/2020   Numbness and tingling of both feet (intermittent/recurrent) 05/16/2020   Pain in rib (Bilateral) (R>L) (intermittent) 05/16/2020   Chronic knee pain (Left) 05/16/2020   Patellar pain (Left) (chronic, intermittent) 05/16/2020   Hard of hearing 05/16/2020   Carcinoma, lung, 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/25/2019   S/P partial lobectomy of lung 02/25/2019   Mass of upper lobe of right lung 01/06/2019   Chronic nonintractable headache 06/14/2018   Head ache 12/03/2017   Sinus bradycardia 10/06/2017   Chronic low back pain (1ry area of Pain) (Bilateral) (L>R) w/o sciatica 08/17/2017   Panic attack as reaction to stress 08/17/2017   Anxiety 06/15/2017   Atypical chest pain 06/15/2017   Hyperlipidemia 06/15/2017  Cholecystitis with cholelithiasis 02/27/2016   Increased frequency of urination 01/27/2015   History of night sweats 05/01/2014   Dupuytren's contracture 07/18/2013   Inverted nipple 04/18/2013   Depression 04/23/2010   Fibromyalgia 04/23/2010   Hypercholesterolemia 04/23/2010   Essential hypertension 04/23/2010   Hypothyroidism 04/23/2010   Irritable colon 04/23/2010   Osteoarthritis 04/23/2010    PCP: Cain Sieve, MD  REFERRING PROVIDER: Cain Sieve, MD  REFERRING DIAG: Chronic low back pain; At risk for falls  Rationale for Evaluation and Treatment: Rehabilitation  THERAPY DIAG:   Muscle weakness (generalized)  Abnormality of gait and mobility  Difficulty in walking, not elsewhere classified  Other lack of coordination  Other abnormalities of gait and mobility  Unsteadiness on feet  ONSET DATE: chronic but worse since Jan 2024  SUBJECTIVE:                                                                                                                                                                                           SUBJECTIVE STATEMENT:  Patient reports some new left lower leg/foot symptoms- numbness/tingling. She denies any blood pressure issues and no falls.    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 She has received PT in the past for condition with positive results and currently using pain management strategies including pain meds, Heat, Exercises, Yoga, lidocain patch, diclofenac topical gel, and biofreeze. She was HTN controlled with medication.  She reports still lives alone but having trouble with community and social outings and having to modify how she performs her ADLs and housework due to her chronic pain. PMH- Spondylosis, Scoliosis, HTN, Lung CA.  PAIN:  Are you having pain? Yes 6/10 across low back, midline  PRECAUTIONS: Fall  WEIGHT BEARING RESTRICTIONS: No  FALLS:  Has patient fallen in last 6 months? No- However patient reports some balance issues and near falls- using cane with community outings  PLOF: Independent  PATIENT GOALS: To decrease my pain  NEXT MD VISIT: unknown   OBJECTIVE:   TODAY'S TREATMENT:  DATE:  02/18/2023     BP sitting= 127/57 BP standing = 122/52 mmHg- slight dizzy feeling- went away with time up  THEREX:  Self seated stretching: Lumbar flex 30 sec x 3, hamstring - 30 sec x 3  each LE and sciatic nerve flossing stretch x 10    NMR:  Static stand (back leg on airex pad and front leg on 6" block) while arranging dry magnet letters  on board in colored sections x several min- changing foot positions from narrowed to staggered to near tandem.   Dynamic high knee march on airex pad x 10 reps each LE Static stand with back leg on airex pad and front on 6" block with horizontal head turns x 10.  CGA from PT for safety for all NMR and standing therex throughout session.     PATIENT EDUCATION:  Education details: PT plan of care; Exercise technique  Person educated: Patient Education method: Explanation, Demonstration, Tactile cues, and Verbal cues Education comprehension: verbalized understanding, returned demonstration, verbal cues required, and tactile cues required  HOME EXERCISE PROGRAM: Access Code: [email or web address] URL: [email or web address] Date: 02/18/2023 Prepared by: Maureen Ralphs  Exercises - Seated Sciatic Tensioner  - 1 x daily - 7 x weekly - 3 sets - 20 hold      11/14/22:  Side step c red band around knees, back against wall   Access Code: [email or web address] URL: [email or web address] Date: 10/13/2022 Prepared by: Maureen Ralphs  Exercises - Standing Quadratus Lumborum Stretch with Doorway  - 1 x daily - 7 x weekly - 3 sets - 20-30 sec hold - Standing Quadratus Lumborum Mobilization with Small Ball on Wall  - 1 x daily - 7 x weekly - 3 sets - 10 reps - Supine Quadratus Lumborum Stretch  - 1 x daily - 7 x weekly - 3 sets - 20-30 hold - 90/90 SI Joint Self-Correction with Dowel  - 1 x daily - 7 x weekly - 3 sets - 10 reps - Hooklying Isometric Hip Abduction with Belt  - 1 x daily - 7 x weekly - 3 sets - 10 reps - 5 hold    Access Code: [email or web address] URL: [email or web address] Date: 10/09/2022 Prepared by: Maureen Ralphs  Exercises - Standing Quadratus Lumborum Stretch with Doorway  - 1 x daily - 7 x  weekly - 3 sets - 20-30 sec hold - Standing Quadratus Lumborum Mobilization with Small Ball on Wall  - 1 x daily - 7 x weekly - 3 sets - 10 reps - Supine Quadratus Lumborum Stretch  - 1 x daily - 7 x weekly - 3 sets - 20-30 hold  ASSESSMENT:  CLINICAL IMPRESSION:  Treatment continued with focus on stretching sciatic nerve and then dynamic standing balance. She performed well overall- challenged with more narrowed standing and dual task activity today. No complaint of any worsening symptoms today.  Pt will continue to benefit from skilled physical therapy intervention to address impairments, improve QOL, and attain therapy goals.       OBJECTIVE IMPAIRMENTS: Abnormal gait, decreased activity tolerance, decreased balance, decreased endurance, decreased mobility, difficulty walking, decreased strength, and pain.   ACTIVITY LIMITATIONS: carrying, lifting, bending, sitting, standing, squatting, sleeping, stairs, transfers, and bed mobility  PARTICIPATION LIMITATIONS: cleaning, laundry, shopping, community activity, and yard work  PERSONAL FACTORS: Age, Time since onset of injury/illness/exacerbation, and 3+ comorbidities: scoliosis, HTN, degenerative spine  are also affecting patient's functional outcome.   REHAB POTENTIAL: Good  CLINICAL DECISION MAKING: Stable/uncomplicated  EVALUATION COMPLEXITY: Moderate  GOALS: Goals reviewed with patient? Yes  SHORT TERM GOALS: Target date: 10/24/2022  Pt will decrease worst back pain as reported on NPRS by at least 2 points in order to demonstrate clinically significant reduction in back pain.  Baseline: Eval= 8/10; 11/17/2022= 4/10 current and up to 7-8/10 at worst.  Goal status: MET  LONG TERM GOALS: Target date: 02/27/2023  Pt will be independent with Final HEP in order to improve strength and decrease back pain in order to improve pain-free function at home and work.  Baseline: EVAL= Patient reports needs update and review of exercises to  assist with her back pain; 11/17/2022- Patient performing mostly self stretching and yoga based exercises and will continue to benefit from adding to comprehensive HEP; 12/05/2022- HEP ongoing focusing more on Lumbar strengthening Goal status: ONGOING  2.  Pt will decrease worst back pain as reported on NPRS by at least 3 points in order to demonstrate clinically significant reduction in back pain.  Baseline: EVAL= 8/10; 11/17/2022= 4/10 current and up to 7-8/10 at worst. 12/05/2022= 4/10 current- and up to 6/10 depending on activity 01/12/23: 4/10 and is consistently at this level.  Goal status: ONGOING  3.  Pt will decrease MODI score by at least 13 points in order demonstrate clinically significant reduction in back pain/disability.   Baseline: EVAL=58%; 11/17/2022= 40%;12/05/2022= 44% 01/12/23:38% Goal status: MET  4.  Pt will improve FOTO to target score of >55 to display perceived improvements in ability to complete ADL's.  Baseline: EVAL= 54; 11/14/22: 56; 12/05/2022=63 Goal status: MET  5.  Patient (> 55 years old) will complete five times sit to stand test in <15 seconds indicating an increased LE strength and improved balance.  Baseline: EVAL = 20.33; 11/17/2022= 16.55 sec without UE Support; 12/05/2022= 16.47 sec without UE support 01/12/23: 15.5 sec  Goal status: PROGRESSING  6.  Pt will increase by at least 0.13 m/s in order to demonstrate clinically significant improvement in community ambulation.   Baseline: EVAL = 0.97 m/s; 6/10=0.98 m/s; 12/05/2022= 0.98 m/s 8/5:1.08 m/s  Goal status: PROGRESSING  7.  Patient will report ability to complete > or equal to 60 min grocery store outing or similar community activiities to return to her previous level of function.  Baseline: EVAL: patient reports difficulty tolerating outings in community. 12/05/2022= Patient reports up to around 40 min of continuous community activities.  01/12/23: last time she went she was up for an hour and used cart and had  no issues, has some issues with heat but that has been goingn on for 20 years per pt .  Goal status: MET    8. Pt will improve BERG by at least 3 points in order to demonstrate clinically significant improvement in balance.    Baseline: 09/30/2022= 47/56; 12/05/2022= 53/56  Goal status: MET  9. Pt will increase by at least 18m (174ft) in order to demonstrate clinically significant improvement in cardiopulmonary endurance and community ambulation   Baseline: 09/23/2022= use of SPC RUE (pt preference): starting pain 4/10, then 5/10 at 373ft, and 6/10 at 525ft: audible RR dyspnea. Test terminated after 665ft, 3 minutes 15 seconds; 11/17/2022= 920 feet in 5 min 37 sec c/o 4/10 LBP and some DOE- O2 sat unable to read due to cold fingers- Respirations= 23/min; 12/05/2022= 1050 feet in 6 min  01/12/23: 1030 ft Spo2 95% post bout  Goal status: MET  PLAN:  PT FREQUENCY: 1-2x/week  PT DURATION: 12 weeks  PLANNED INTERVENTIONS: Therapeutic exercises, Therapeutic  activity, Neuromuscular re-education, Balance training, Gait training, Patient/Family education, Self Care, Joint mobilization, Joint manipulation, Stair training, Vestibular training, Canalith repositioning, Orthotic/Fit training, DME instructions, Dry Needling, Electrical stimulation, Spinal manipulation, Spinal mobilization, Cryotherapy, Moist heat, scar mobilization, Taping, and Manual therapy.  PLAN FOR NEXT SESSION:    Continue to monitor vitals to add data for BP values.  Functional LE strengthening and core strength, dynamic balance training.      Lenda Kelp, PT 02/19/2023, 8:23 AM   8:23 AM, 02/19/23 Physical Therapist - [facility]  Outpatient Physical Therapy- [facility] [phone]    8:23 AM, 02/19/23

## 2023-02-23 ENCOUNTER — Ambulatory Visit: Payer: Medicare HMO

## 2023-02-23 DIAGNOSIS — R269 Unspecified abnormalities of gait and mobility: Secondary | ICD-10-CM

## 2023-02-23 DIAGNOSIS — R2689 Other abnormalities of gait and mobility: Secondary | ICD-10-CM

## 2023-02-23 DIAGNOSIS — M6281 Muscle weakness (generalized): Secondary | ICD-10-CM

## 2023-02-23 DIAGNOSIS — R278 Other lack of coordination: Secondary | ICD-10-CM

## 2023-02-23 DIAGNOSIS — R262 Difficulty in walking, not elsewhere classified: Secondary | ICD-10-CM

## 2023-02-23 DIAGNOSIS — R2681 Unsteadiness on feet: Secondary | ICD-10-CM

## 2023-02-23 NOTE — Therapy (Signed)
OUTPATIENT PHYSICAL THERAPY TREATMENT   Patient Name: Kendra Benton MRN: [id number] DOB:[birth year], 82 y.o., female Today's Date: 02/23/2023  END OF SESSION:  PT End of Session - 02/23/23 1623     Visit Number 28    Number of Visits 38   date corrected to reflect most recent recert   Date for PT Re-Evaluation 02/27/23    Authorization Type [employer] primary; [place] medicaid secondary    Authorization Time Period 09/11/22-12/04/22    Progress Note Due on Visit 30    PT Start Time 1615    PT Stop Time 1658    PT Time Calculation (min) 43 min    Equipment Utilized During Treatment Gait belt    Activity Tolerance Patient tolerated treatment well;No increased pain    Behavior During Therapy WFL for tasks assessed/performed                       Past Medical History:  Diagnosis Date   Allergy    Anxiety    GERD (gastroesophageal reflux disease)    Headache    History of kidney stones    Hypertension    Myocardial infarction (HCC)    1997   Osteoporosis    Pneumonia    Past Surgical History:  Procedure Laterality Date   ABDOMINAL HYSTERECTOMY     patient has ovaries   APPENDECTOMY     ARTERY BIOPSY Right 12/30/2017   Procedure: BIOPSY TEMPORAL ARTERY;  Surgeon: Renford Dills, MD;  Location: [facility];  Service: Vascular;  Laterality: Right;   ARTERY BIOPSY Left 06/18/2018   Procedure: BIOPSY TEMPORAL ARTERY;  Surgeon: Renford Dills, MD;  Location: [facility];  Service: Vascular;  Laterality: Left;   BACK SURGERY     CARDIAC CATHETERIZATION     CATARACT EXTRACTION W/ INTRAOCULAR LENS  IMPLANT, BILATERAL Bilateral 2010   CHOLECYSTECTOMY N/A 02/28/2016   Procedure: LAPAROSCOPIC CHOLECYSTECTOMY WITH INTRAOPERATIVE CHOLANGIOGRAM;  Surgeon: Tiney Rouge III, MD;  Location: [facility];  Service: General;  Laterality: N/A;   LUMBAR FUSION  11/09/2016   L3-L5   SPINE SURGERY     Disc   TONSILLECTOMY     Patient Active Problem List   Diagnosis Date Noted   Fall  (on) (from) other stairs and steps, initial encounter 08/06/2020   Lumbar facet joint syndrome 06/18/2020   Spondylosis without myelopathy or radiculopathy, lumbosacral region 06/18/2020   DDD (degenerative disc disease), lumbosacral 06/18/2020   COVID 06/04/2020   Chronic pain syndrome 05/16/2020   Pharmacologic therapy 05/16/2020   Disorder of skeletal system 05/16/2020   Problems influencing health status 05/16/2020   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   Chronic hip pain (2ry area of Pain) (Bilateral) (R>L) 05/16/2020   Chronic lower extremity pain (Left) 05/16/2020   Numbness and tingling of both feet (intermittent/recurrent) 05/16/2020   Pain in rib (Bilateral) (R>L) (intermittent) 05/16/2020   Chronic knee pain (Left) 05/16/2020   Patellar pain (Left) (chronic, intermittent) 05/16/2020   Hard of hearing 05/16/2020   Carcinoma, lung, 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/25/2019   S/P partial lobectomy of lung 02/25/2019   Mass of upper lobe of right lung 01/06/2019   Chronic nonintractable headache 06/14/2018   Head ache 12/03/2017   Sinus bradycardia 10/06/2017   Chronic low back pain (1ry area of Pain) (Bilateral) (L>R) w/o sciatica 08/17/2017   Panic attack as reaction to stress 08/17/2017   Anxiety 06/15/2017   Atypical chest pain 06/15/2017   Hyperlipidemia 06/15/2017  Cholecystitis with cholelithiasis 02/27/2016   Increased frequency of urination 01/27/2015   History of night sweats 05/01/2014   Dupuytren's contracture 07/18/2013   Inverted nipple 04/18/2013   Depression 04/23/2010   Fibromyalgia 04/23/2010   Hypercholesterolemia 04/23/2010   Essential hypertension 04/23/2010   Hypothyroidism 04/23/2010   Irritable colon 04/23/2010   Osteoarthritis 04/23/2010    PCP: Cain Sieve, MD  REFERRING PROVIDER: Cain Sieve, MD  REFERRING DIAG: Chronic low back pain; At risk for falls  Rationale for Evaluation and Treatment: Rehabilitation  THERAPY DIAG:   Muscle weakness (generalized)  Abnormality of gait and mobility  Difficulty in walking, not elsewhere classified  Other lack of coordination  Other abnormalities of gait and mobility  Unsteadiness on feet  ONSET DATE: chronic but worse since Jan 2024  SUBJECTIVE:                                                                                                                                                                                           SUBJECTIVE STATEMENT:  Patient reports the stretches are helping and states less numbness in legs. Reports some tingling left ant calf.   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 She has received PT in the past for condition with positive results and currently using pain management strategies including pain meds, Heat, Exercises, Yoga, lidocain patch, diclofenac topical gel, and biofreeze. She was HTN controlled with medication.  She reports still lives alone but having trouble with community and social outings and having to modify how she performs her ADLs and housework due to her chronic pain. PMH- Spondylosis, Scoliosis, HTN, Lung CA.  PAIN:  Are you having pain? Yes 6/10 across low back, midline  PRECAUTIONS: Fall  WEIGHT BEARING RESTRICTIONS: No  FALLS:  Has patient fallen in last 6 months? No- However patient reports some balance issues and near falls- using cane with community outings  PLOF: Independent  PATIENT GOALS: To decrease my pain  NEXT MD VISIT: unknown   OBJECTIVE:   TODAY'S TREATMENT:  DATE:  02/23/2023        NMR:   Static step tap onto 6" block x 20 reps alt LE without UE support  Dynamic step up with opp LE high knee march without UE x 20 reps (unstable- more on right than  left)  Dynamic high knee march on airex pad x 20 reps each LE  Static stand on airex pad with feet narrowed and eyes closed x 15 sec x2  Static stand - tandem (feet on floor) x 30 sec x 2 Static stand -tandem (feet on airex pad) x 10 sec x2 Static stand - tandem (feet on airex beam) x 10 sec x 2 Tandem dynamic walking in // bars (flat floor, 6" block, airex pad, and airex beam) down and back x 4.   Side step in // bars- on obstacles- 6" block, airex pad, airex beam- down and back x 3.   Single leg standing in // bars- 4-7 sec hold each LE- "Tree yoga stance"     CGA from PT for safety for all NMR and standing therex throughout session.     PATIENT EDUCATION:  Education details: PT plan of care; Exercise technique  Person educated: Patient Education method: Explanation, Demonstration, Tactile cues, and Verbal cues Education comprehension: verbalized understanding, returned demonstration, verbal cues required, and tactile cues required  HOME EXERCISE PROGRAM: Access Code: [email or web address] URL: [email or web address] Date: 02/18/2023 Prepared by: Maureen Ralphs  Exercises - Seated Sciatic Tensioner  - 1 x daily - 7 x weekly - 3 sets - 20 hold      11/14/22:  Side step c red band around knees, back against wall   Access Code: [email or web address] URL: [email or web address] Date: 10/13/2022 Prepared by: Maureen Ralphs  Exercises - Standing Quadratus Lumborum Stretch with Doorway  - 1 x daily - 7 x weekly - 3 sets - 20-30 sec hold - Standing Quadratus Lumborum Mobilization with Small Ball on Wall  - 1 x daily - 7 x weekly - 3 sets - 10 reps - Supine Quadratus Lumborum Stretch  - 1 x daily - 7 x weekly - 3 sets - 20-30 hold - 90/90 SI Joint Self-Correction with Dowel  - 1 x daily - 7 x weekly - 3 sets - 10 reps - Hooklying Isometric Hip Abduction with Belt  - 1 x daily - 7 x weekly - 3 sets - 10 reps - 5 hold    Access Code: [email or web address] URL:  [email or web address] Date: 10/09/2022 Prepared by: Maureen Ralphs  Exercises - Standing Quadratus Lumborum Stretch with Doorway  - 1 x daily - 7 x weekly - 3 sets - 20-30 sec hold - Standing Quadratus Lumborum Mobilization with Small Ball on Wall  - 1 x daily - 7 x weekly - 3 sets - 10 reps - Supine Quadratus Lumborum Stretch  - 1 x daily - 7 x weekly - 3 sets - 20-30 hold  ASSESSMENT:  CLINICAL IMPRESSION:  Patient presents with good motivation for today's session. She worked hard in // bars performing several narrowed or single leg stance activities ehxibiting some unsteadiness but overall abel to make good progress- increased SLS time and good activation of core and gluteals today.   Pt will continue to benefit from skilled physical therapy intervention to address impairments, improve QOL, and attain therapy goals.       OBJECTIVE IMPAIRMENTS: Abnormal gait, decreased activity tolerance, decreased balance, decreased endurance, decreased mobility, difficulty walking, decreased strength, and pain.   ACTIVITY LIMITATIONS: carrying, lifting, bending, sitting,  standing, squatting, sleeping, stairs, transfers, and bed mobility  PARTICIPATION LIMITATIONS: cleaning, laundry, shopping, community activity, and yard work  PERSONAL FACTORS: Age, Time since onset of injury/illness/exacerbation, and 3+ comorbidities: scoliosis, HTN, degenerative spine  are also affecting patient's functional outcome.   REHAB POTENTIAL: Good  CLINICAL DECISION MAKING: Stable/uncomplicated  EVALUATION COMPLEXITY: Moderate   GOALS: Goals reviewed with patient? Yes  SHORT TERM GOALS: Target date: 10/24/2022  Pt will decrease worst back pain as reported on NPRS by at least 2 points in order to demonstrate clinically significant reduction in back pain.  Baseline: Eval= 8/10; 11/17/2022= 4/10 current and up to 7-8/10 at worst.  Goal status: MET  LONG TERM GOALS: Target date: 02/27/2023  Pt  will be independent with Final HEP in order to improve strength and decrease back pain in order to improve pain-free function at home and work.  Baseline: EVAL= Patient reports needs update and review of exercises to assist with her back pain; 11/17/2022- Patient performing mostly self stretching and yoga based exercises and will continue to benefit from adding to comprehensive HEP; 12/05/2022- HEP ongoing focusing more on Lumbar strengthening Goal status: ONGOING  2.  Pt will decrease worst back pain as reported on NPRS by at least 3 points in order to demonstrate clinically significant reduction in back pain.  Baseline: EVAL= 8/10; 11/17/2022= 4/10 current and up to 7-8/10 at worst. 12/05/2022= 4/10 current- and up to 6/10 depending on activity 01/12/23: 4/10 and is consistently at this level.  Goal status: ONGOING  3.  Pt will decrease MODI score by at least 13 points in order demonstrate clinically significant reduction in back pain/disability.   Baseline: EVAL=58%; 11/17/2022= 40%;12/05/2022= 44% 01/12/23:38% Goal status: MET  4.  Pt will improve FOTO to target score of >55 to display perceived improvements in ability to complete ADL's.  Baseline: EVAL= 54; 11/14/22: 56; 12/05/2022=63 Goal status: MET  5.  Patient (> 48 years old) will complete five times sit to stand test in <15 seconds indicating an increased LE strength and improved balance.  Baseline: EVAL = 20.33; 11/17/2022= 16.55 sec without UE Support; 12/05/2022= 16.47 sec without UE support 01/12/23: 15.5 sec  Goal status: PROGRESSING  6.  Pt will increase by at least 0.13 m/s in order to demonstrate clinically significant improvement in community ambulation.   Baseline: EVAL = 0.97 m/s; 6/10=0.98 m/s; 12/05/2022= 0.98 m/s 8/5:1.08 m/s  Goal status: PROGRESSING  7.  Patient will report ability to complete > or equal to 60 min grocery store outing or similar community activiities to return to her previous level of function.  Baseline:  EVAL: patient reports difficulty tolerating outings in community. 12/05/2022= Patient reports up to around 40 min of continuous community activities.  01/12/23: last time she went she was up for an hour and used cart and had no issues, has some issues with heat but that has been goingn on for 20 years per pt .  Goal status: MET    8. Pt will improve BERG by at least 3 points in order to demonstrate clinically significant improvement in balance.    Baseline: 09/30/2022= 47/56; 12/05/2022= 53/56  Goal status: MET  9. Pt will increase by at least 61m (160ft) in order to demonstrate clinically significant improvement in cardiopulmonary endurance and community ambulation   Baseline: 09/23/2022= use of SPC RUE (pt preference): starting pain 4/10, then 5/10 at 39ft, and 6/10 at 546ft: audible RR dyspnea. Test terminated after 670ft, 3 minutes 15 seconds; 11/17/2022= 920  feet in 5 min 37 sec c/o 4/10 LBP and some DOE- O2 sat unable to read due to cold fingers- Respirations= 23/min; 12/05/2022= 1050 feet in 6 min  01/12/23: 1030 ft Spo2 95% post bout  Goal status: MET  PLAN:  PT FREQUENCY: 1-2x/week  PT DURATION: 12 weeks  PLANNED INTERVENTIONS: Therapeutic exercises, Therapeutic activity, Neuromuscular re-education, Balance training, Gait training, Patient/Family education, Self Care, Joint mobilization, Joint manipulation, Stair training, Vestibular training, Canalith repositioning, Orthotic/Fit training, DME instructions, Dry Needling, Electrical stimulation, Spinal manipulation, Spinal mobilization, Cryotherapy, Moist heat, scar mobilization, Taping, and Manual therapy.  PLAN FOR NEXT SESSION:    Continue to monitor vitals to add data for BP values.  Functional LE strengthening and core strength, dynamic balance training.      Lenda Kelp, PT 02/23/2023, 5:22 PM   5:22 PM, 02/23/23 Physical Therapist - [facility]  Outpatient Physical Therapy- Main  Campus [phone]    5:22 PM, 02/23/23

## 2023-02-25 ENCOUNTER — Ambulatory Visit: Payer: Medicare HMO

## 2023-02-25 DIAGNOSIS — M6281 Muscle weakness (generalized): Secondary | ICD-10-CM

## 2023-02-25 DIAGNOSIS — R278 Other lack of coordination: Secondary | ICD-10-CM

## 2023-02-25 DIAGNOSIS — G8929 Other chronic pain: Secondary | ICD-10-CM

## 2023-02-25 DIAGNOSIS — R2689 Other abnormalities of gait and mobility: Secondary | ICD-10-CM

## 2023-02-25 DIAGNOSIS — R262 Difficulty in walking, not elsewhere classified: Secondary | ICD-10-CM

## 2023-02-25 DIAGNOSIS — R269 Unspecified abnormalities of gait and mobility: Secondary | ICD-10-CM

## 2023-02-25 DIAGNOSIS — R2681 Unsteadiness on feet: Secondary | ICD-10-CM

## 2023-02-25 NOTE — Therapy (Signed)
OUTPATIENT PHYSICAL THERAPY TREATMENT   Patient Name: Kendra Benton MRN: [id number] DOB:03/22/1941, 82 y.o., female Today's Date: 02/25/2023  END OF SESSION:  PT End of Session - 02/25/23 1628     Visit Number 29    Number of Visits 38   date corrected to reflect most recent recert   Date for PT Re-Evaluation 02/27/23    Authorization Type [employer] primary; [place] medicaid secondary    Authorization Time Period 09/11/22-12/04/22    Progress Note Due on Visit 30    PT Start Time 1622    PT Stop Time 1658    PT Time Calculation (min) 36 min    Equipment Utilized During Treatment Gait belt    Activity Tolerance Patient tolerated treatment well;No increased pain    Behavior During Therapy WFL for tasks assessed/performed                       Past Medical History:  Diagnosis Date   Allergy    Anxiety    GERD (gastroesophageal reflux disease)    Headache    History of kidney stones    Hypertension    Myocardial infarction (HCC)    1997   Osteoporosis    Pneumonia    Past Surgical History:  Procedure Laterality Date   ABDOMINAL HYSTERECTOMY     patient has ovaries   APPENDECTOMY     ARTERY BIOPSY Right 12/30/2017   Procedure: BIOPSY TEMPORAL ARTERY;  Surgeon: Renford Dills, MD;  Location: [facility];  Service: Vascular;  Laterality: Right;   ARTERY BIOPSY Left 06/18/2018   Procedure: BIOPSY TEMPORAL ARTERY;  Surgeon: Renford Dills, MD;  Location: [facility];  Service: Vascular;  Laterality: Left;   BACK SURGERY     CARDIAC CATHETERIZATION     CATARACT EXTRACTION W/ INTRAOCULAR LENS  IMPLANT, BILATERAL Bilateral 2010   CHOLECYSTECTOMY N/A 02/28/2016   Procedure: LAPAROSCOPIC CHOLECYSTECTOMY WITH INTRAOPERATIVE CHOLANGIOGRAM;  Surgeon: Tiney Rouge III, MD;  Location: [facility];  Service: General;  Laterality: N/A;   LUMBAR FUSION  11/09/2016   L3-L5   SPINE SURGERY     Disc   TONSILLECTOMY     Patient Active Problem List   Diagnosis Date Noted   Fall  (on) (from) other stairs and steps, initial encounter 08/06/2020   Lumbar facet joint syndrome 06/18/2020   Spondylosis without myelopathy or radiculopathy, lumbosacral region 06/18/2020   DDD (degenerative disc disease), lumbosacral 06/18/2020   COVID 06/04/2020   Chronic pain syndrome 05/16/2020   Pharmacologic therapy 05/16/2020   Disorder of skeletal system 05/16/2020   Problems influencing health status 05/16/2020   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   Chronic hip pain (2ry area of Pain) (Bilateral) (R>L) 05/16/2020   Chronic lower extremity pain (Left) 05/16/2020   Numbness and tingling of both feet (intermittent/recurrent) 05/16/2020   Pain in rib (Bilateral) (R>L) (intermittent) 05/16/2020   Chronic knee pain (Left) 05/16/2020   Patellar pain (Left) (chronic, intermittent) 05/16/2020   Hard of hearing 05/16/2020   Carcinoma, lung, 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/25/2019   S/P partial lobectomy of lung 02/25/2019   Mass of upper lobe of right lung 01/06/2019   Chronic nonintractable headache 06/14/2018   Head ache 12/03/2017   Sinus bradycardia 10/06/2017   Chronic low back pain (1ry area of Pain) (Bilateral) (L>R) w/o sciatica 08/17/2017   Panic attack as reaction to stress 08/17/2017   Anxiety 06/15/2017   Atypical chest pain 06/15/2017   Hyperlipidemia 06/15/2017  Cholecystitis with cholelithiasis 02/27/2016   Increased frequency of urination 01/27/2015   History of night sweats 05/01/2014   Dupuytren's contracture 07/18/2013   Inverted nipple 04/18/2013   Depression 04/23/2010   Fibromyalgia 04/23/2010   Hypercholesterolemia 04/23/2010   Essential hypertension 04/23/2010   Hypothyroidism 04/23/2010   Irritable colon 04/23/2010   Osteoarthritis 04/23/2010    PCP: Cain Sieve, MD  REFERRING PROVIDER: Cain Sieve, MD  REFERRING DIAG: Chronic low back pain; At risk for falls  Rationale for Evaluation and Treatment: Rehabilitation  THERAPY DIAG:   Muscle weakness (generalized)  Abnormality of gait and mobility  Difficulty in walking, not elsewhere classified  Other lack of coordination  Other abnormalities of gait and mobility  Unsteadiness on feet  Chronic bilateral low back pain without sciatica  ONSET DATE: chronic but worse since Jan 2024  SUBJECTIVE:                                                                                                                                                                                           SUBJECTIVE STATEMENT:  Patient reports that she was able to walk in the rain yesterday and states tired from running errands the majority of afternoon just prior to visit.   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 She has received PT in the past for condition with positive results and currently using pain management strategies including pain meds, Heat, Exercises, Yoga, lidocain patch, diclofenac topical gel, and biofreeze. She was HTN controlled with medication.  She reports still lives alone but having trouble with community and social outings and having to modify how she performs her ADLs and housework due to her chronic pain. PMH- Spondylosis, Scoliosis, HTN, Lung CA.  PAIN:  Are you having pain? Yes 6/10 across low back, midline  PRECAUTIONS: Fall  WEIGHT BEARING RESTRICTIONS: No  FALLS:  Has patient fallen in last 6 months? No- However patient reports some balance issues and near falls- using cane with community outings  PLOF: Independent  PATIENT GOALS: To decrease my pain  NEXT MD VISIT: unknown   OBJECTIVE:   TODAY'S TREATMENT:  DATE:  02/25/2023        NMR: in // bars with CGA, use of gait belt  Dynamic high knee walk in // bars- down and back x  10  Tandem gait forward and retro steps backward in // bars x 10  Single leg standing in // bars- 7-10 sec hold each LE x multiple attempts.   Resistive gait using 4# AW BLE- 450 feet with mild staggering at times yet no LOB  Sit to stand + overhead reaching + calf raises x 10 reps (very unstable initially- did improve with practice)      CGA from PT for safety for all NMR and standing therex throughout session.     PATIENT EDUCATION:  Education details: PT plan of care; Exercise technique  Person educated: Patient Education method: Explanation, Demonstration, Tactile cues, and Verbal cues Education comprehension: verbalized understanding, returned demonstration, verbal cues required, and tactile cues required  HOME EXERCISE PROGRAM: Access Code: [email or web address] URL: [email or web address] Date: 02/18/2023 Prepared by: Maureen Ralphs  Exercises - Seated Sciatic Tensioner  - 1 x daily - 7 x weekly - 3 sets - 20 hold      11/14/22:  Side step c red band around knees, back against wall   Access Code: [email or web address] URL: [email or web address] Date: 10/13/2022 Prepared by: Maureen Ralphs  Exercises - Standing Quadratus Lumborum Stretch with Doorway  - 1 x daily - 7 x weekly - 3 sets - 20-30 sec hold - Standing Quadratus Lumborum Mobilization with Small Ball on Wall  - 1 x daily - 7 x weekly - 3 sets - 10 reps - Supine Quadratus Lumborum Stretch  - 1 x daily - 7 x weekly - 3 sets - 20-30 hold - 90/90 SI Joint Self-Correction with Dowel  - 1 x daily - 7 x weekly - 3 sets - 10 reps - Hooklying Isometric Hip Abduction with Belt  - 1 x daily - 7 x weekly - 3 sets - 10 reps - 5 hold    Access Code: [email or web address] URL: [email or web address] Date: 10/09/2022 Prepared by: Maureen Ralphs  Exercises - Standing Quadratus Lumborum Stretch with Doorway  - 1 x daily - 7 x weekly - 3 sets - 20-30 sec hold - Standing Quadratus Lumborum  Mobilization with Small Ball on Wall  - 1 x daily - 7 x weekly - 3 sets - 10 reps - Supine Quadratus Lumborum Stretch  - 1 x daily - 7 x weekly - 3 sets - 20-30 hold  ASSESSMENT:  CLINICAL IMPRESSION:  Patient performed well overall- continues to walk tandem with improving balance. Small improvement with SLS each LE and only fatigue as limiting factor with resistive gait today.  Pt will continue to benefit from skilled physical therapy intervention to address impairments, improve QOL, and attain therapy goals.       OBJECTIVE IMPAIRMENTS: Abnormal gait, decreased activity tolerance, decreased balance, decreased endurance, decreased mobility, difficulty walking, decreased strength, and pain.   ACTIVITY LIMITATIONS: carrying, lifting, bending, sitting, standing, squatting, sleeping, stairs, transfers, and bed mobility  PARTICIPATION LIMITATIONS: cleaning, laundry, shopping, community activity, and yard work  PERSONAL FACTORS: Age, Time since onset of injury/illness/exacerbation, and 3+ comorbidities: scoliosis, HTN, degenerative spine  are also affecting patient's functional outcome.   REHAB POTENTIAL: Good  CLINICAL DECISION MAKING: Stable/uncomplicated  EVALUATION COMPLEXITY: Moderate   GOALS: Goals reviewed with patient? Yes  SHORT TERM GOALS: Target date: 10/24/2022  Pt will decrease worst back pain as reported on NPRS by at least 2 points  in order to demonstrate clinically significant reduction in back pain.  Baseline: Eval= 8/10; 11/17/2022= 4/10 current and up to 7-8/10 at worst.  Goal status: MET  LONG TERM GOALS: Target date: 02/27/2023  Pt will be independent with Final HEP in order to improve strength and decrease back pain in order to improve pain-free function at home and work.  Baseline: EVAL= Patient reports needs update and review of exercises to assist with her back pain; 11/17/2022- Patient performing mostly self stretching and yoga based exercises and will  continue to benefit from adding to comprehensive HEP; 12/05/2022- HEP ongoing focusing more on Lumbar strengthening Goal status: ONGOING  2.  Pt will decrease worst back pain as reported on NPRS by at least 3 points in order to demonstrate clinically significant reduction in back pain.  Baseline: EVAL= 8/10; 11/17/2022= 4/10 current and up to 7-8/10 at worst. 12/05/2022= 4/10 current- and up to 6/10 depending on activity 01/12/23: 4/10 and is consistently at this level.  Goal status: ONGOING  3.  Pt will decrease MODI score by at least 13 points in order demonstrate clinically significant reduction in back pain/disability.   Baseline: EVAL=58%; 11/17/2022= 40%;12/05/2022= 44% 01/12/23:38% Goal status: MET  4.  Pt will improve FOTO to target score of >55 to display perceived improvements in ability to complete ADL's.  Baseline: EVAL= 54; 11/14/22: 56; 12/05/2022=63 Goal status: MET  5.  Patient (> 56 years old) will complete five times sit to stand test in <15 seconds indicating an increased LE strength and improved balance.  Baseline: EVAL = 20.33; 11/17/2022= 16.55 sec without UE Support; 12/05/2022= 16.47 sec without UE support 01/12/23: 15.5 sec  Goal status: PROGRESSING  6.  Pt will increase by at least 0.13 m/s in order to demonstrate clinically significant improvement in community ambulation.   Baseline: EVAL = 0.97 m/s; 6/10=0.98 m/s; 12/05/2022= 0.98 m/s 8/5:1.08 m/s  Goal status: PROGRESSING  7.  Patient will report ability to complete > or equal to 60 min grocery store outing or similar community activiities to return to her previous level of function.  Baseline: EVAL: patient reports difficulty tolerating outings in community. 12/05/2022= Patient reports up to around 40 min of continuous community activities.  01/12/23: last time she went she was up for an hour and used cart and had no issues, has some issues with heat but that has been goingn on for 20 years per pt .  Goal status: MET    8.  Pt will improve BERG by at least 3 points in order to demonstrate clinically significant improvement in balance.    Baseline: 09/30/2022= 47/56; 12/05/2022= 53/56  Goal status: MET  9. Pt will increase by at least 40m (158ft) in order to demonstrate clinically significant improvement in cardiopulmonary endurance and community ambulation   Baseline: 09/23/2022= use of SPC RUE (pt preference): starting pain 4/10, then 5/10 at 330ft, and 6/10 at 536ft: audible RR dyspnea. Test terminated after 665ft, 3 minutes 15 seconds; 11/17/2022= 920 feet in 5 min 37 sec c/o 4/10 LBP and some DOE- O2 sat unable to read due to cold fingers- Respirations= 23/min; 12/05/2022= 1050 feet in 6 min  01/12/23: 1030 ft Spo2 95% post bout  Goal status: MET  PLAN:  PT FREQUENCY: 1-2x/week  PT DURATION: 12 weeks  PLANNED INTERVENTIONS: Therapeutic exercises, Therapeutic activity, Neuromuscular re-education, Balance training, Gait training, Patient/Family education, Self Care, Joint mobilization, Joint manipulation, Stair training, Vestibular training, Canalith repositioning, Orthotic/Fit training, DME instructions, Dry Needling, Electrical stimulation,  Spinal manipulation, Spinal mobilization, Cryotherapy, Moist heat, scar mobilization, Taping, and Manual therapy.  PLAN FOR NEXT SESSION:    Continue to monitor vitals to add data for BP values.  Functional LE strengthening and core strength, dynamic balance training.      Lenda Kelp, PT 02/25/2023, 5:08 PM   5:08 PM, 02/25/23 Physical Therapist - [facility]  Out[facility] Physical [facility]- [facility] [phone]    5:08 PM, 02/25/23

## 2023-03-02 ENCOUNTER — Ambulatory Visit: Payer: Medicare HMO

## 2023-03-02 NOTE — Progress Notes (Unsigned)
Patient: Kendra Benton  Service Category: E/M  Provider: Oswaldo Done, MD  DOB: 1941/02/18  DOS: 03/04/2023  Referring Provider: Cain Sieve  MRN: [id number]  Setting: Ambulatory outpatient  PCP: Cain Sieve, MD  Type: New Patient  Specialty: Interventional Pain Management    Location: Office  Delivery: Face-to-face     Primary Reason(s) for Visit: Encounter for initial evaluation of one or more chronic problems (new to examiner) potentially causing chronic pain, and posing a threat to normal musculoskeletal function. (Level of risk: High) CC: No chief complaint on file.  HPI  Kendra Benton is a 82 y.o. year old, female patient, who comes for the first time to our practice referred by Cain Sieve,* for our initial evaluation of her chronic pain. She has Cholecystitis with cholelithiasis; Depression; Dupuytren's contracture; Fibromyalgia; History of night sweats; Hypercholesterolemia; Essential hypertension; Hypothyroidism; Inverted nipple; Irritable colon; Osteoarthritis; Increased frequency of urination; Head ache; Anxiety; Atypical chest pain; Carcinoma, lung, right (HCC); Chronic low back pain (1ry area of Pain) (Bilateral) (L>R) w/o sciatica; Mass of upper lobe of right lung; Panic attack as reaction to stress; S/P partial lobectomy of lung; Sinus bradycardia; Hyperlipidemia; Chronic nonintractable headache; Chronic pain syndrome; Pharmacologic therapy; Disorder of skeletal system; Problems influencing health status; Failed back surgical syndrome; Chronic hip pain (2ry area of Pain) (Bilateral) (R>L); Chronic lower extremity pain (Left); Numbness and tingling of both feet (intermittent/recurrent); Pain in rib (Bilateral) (R>L) (intermittent); Chronic knee pain (Left); Patellar pain (Left) (chronic, intermittent); Hard of hearing; COVID; Lumbar facet joint syndrome; Spondylosis without myelopathy or radiculopathy, lumbosacral region; DDD (degenerative disc  disease), lumbosacral; and Fall (on) (from) other stairs and steps, initial encounter on their problem list. Today she comes in for evaluation of her No chief complaint on file.  Pain Assessment: Location:     Radiating:   Onset:   Duration:   Quality:   Severity:  /10 (subjective, self-reported pain score)  Effect on ADL:   Timing:   Modifying factors:   BP:    HR:    Onset and Duration: {Hx; Onset and Duration:210120511} Cause of pain: {Hx; Cause:210120521} Severity: {Pain Severity:210120502} Timing: {Symptoms; Timing:210120501} Aggravating Factors: {Causes; Aggravating pain factors:210120507} Alleviating Factors: {Causes; Alleviating Factors:210120500} Associated Problems: {Hx; Associated problems:210120515} Quality of Pain: {Hx; Symptom quality or Descriptor:210120531} Previous Examinations or Tests: {Hx; Previous examinations or test:210120529} Previous Treatments: {Hx; Previous Treatment:210120503}  Kendra Benton is being evaluated for possible interventional pain management therapies for the treatment of her chronic pain.   Review of the patient's last "initial evaluation" (05/16/2020): "This is a known patient of mine.  The last time I saw her was around 12/20/2013 when I did a bilateral lumbar spinal cord stimulator implant for her pain.  After that, she did not come back and apparently she went to Dr. Daneen Schick at the [facility] where she had her stimulator removed.  She then underwent a back surgery by Dr. Jones Bales and later a second back surgery for fusion, approximately 6 years ago at the [facility] by Dr. Daneen Schick.  She was then referred to the [facility] pain clinic by her physician.  At that pain clinic she received some injections which she indicates that never worked as well as the ones that we had done for her.   Because her last visit was in 2015, and our electronic medical record was changed after that, I do not have access today to those old notes.  However, we had the  patient sign a release form  to get the notes from the [facility] pain clinic as well as the operative report from the [facility].  I have also instructed my staff to search the old electronic medical record for her notes so that I can review them.   The patient indicates her primary pain to be that of the lower back, bilaterally (L>R).  She has undergone 2 back surgeries in addition to the bilateral spinal cord stimulator implant that we had done.  She does not have a recent x-ray of her lumbar spine.   The patient's secondary area pain is that of the hips, bilaterally (R>L) she denies any hip surgery, and she does indicate having had an injection at the [facility] pain clinic but apparently it was of the SI joint.  She refers that the benefits from that lasted only 1 to 2 days.  She was told that her pain is secondary to scar tissue around her fusion rods.   The patient's third area pain is that of the left lower extremity.  This pain is described to be intermittent.  She also describes having bilateral greater toe numbness which is also intermittent.  The pain in the left leg goes down through the posterior and then lateral aspect of the leg into the area just anterior to the knee joint and the anterolateral, distal aspect of the left patella.   The patient's fourth area pain is that of tenderness around the lower rib cage, bilaterally, (R>L).  She describes that that pain started when she fell and bruised her rib cage approximately 4 years ago.  This is not a constant pain, but over time it tends to come and go, but primarily it feels tender to palpation.  She indicates having had some x-rays that were negative for any type of fractures.   Finally, the patient's last area (fifth) of pain is that of the left knee.  She describes the pain as being across her patella and also being intermittent.  She denies any surgeries, joint injections, or any recent x-rays.   Addendum: The surgeons name is Dr. Barbie Haggis.  This surgery was done on 11/10/2014 and the preoperative diagnosis was lumbar radiculopathy and degenerative scoliosis.  The operative report indicates that the procedure was a minimally invasive direct lateral interbody fusion L4-5 with PEEK interbody cage, bone morphogenic protein and grafton DBF.  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  During that particular appointment they documented that the patient has a history of an L3-L5 fusion.  They also documented that she had a fall on December 2018.  And their evaluation they indicated the patient had an MRI of the lumbar spine without contrast done on 04/17/2016 which indicated that she had that interval L3-5 fusion without any residual stenosis.  She also had mild left neuroforaminal stenosis at L5-S1.  The disc protrusion at this level seem to have decreased in size since 2015.  She also had evidence of a chronic L2 compression fracture."  ***  Kendra Benton has been informed that this initial visit was an evaluation only.  On the follow up appointment I will go over the results, including ordered tests and available interventional therapies. At that time she will have the opportunity to decide whether to proceed with offered therapies or not.  In the event that Kendra Benton prefers avoiding interventional options, this will conclude our involvement in the case.  Medication management recommendations may be provided upon request.  Patient informed that diagnostic tests may be ordered to assist in identifying underlying causes, narrow the list of differential diagnoses and aid in determining candidacy for (or contraindications to) planned therapeutic interventions.  Historic Controlled Substance Pharmacotherapy Review  PMP and historical  list of controlled substances: ***  Most recently prescribed opioid analgesics:   *** MME/day: *** mg/day  Historical Monitoring: The patient  reports no history of drug use. List of prior UDS Testing: No results found for: "MDMA", "COCAINSCRNUR", "PCPSCRNUR", "PCPQUANT", "CANNABQUANT", "THCU", "ETH", "CBDTHCR", "D8THCCBX", "D9THCCBX" Historical Background Evaluation: [place] PMP: PDMP reviewed during this encounter. Review of the past 43-months conducted.             PMP NARX Score Report:  Narcotic: 350 Sedative: 401 Stimulant: 000 [place] Department of public safety, offender search: Engineer, mining Information) Non-contributory Risk Assessment Profile: Aberrant behavior: None observed or detected today Risk factors for fatal opioid overdose: None identified today PMP NARX Overdose Risk Score: 030 Fatal overdose hazard ratio (HR): Calculation deferred Non-fatal overdose hazard ratio (HR): Calculation deferred Risk of opioid abuse or dependence: 0.7-3.0% with doses <= 36 MME/day and 6.1-26% with doses >= 120 MME/day. Substance use disorder (SUD) risk level: See below Personal History of Substance Abuse (SUD-Substance use disorder):  Alcohol:    Illegal Drugs:    Rx Drugs:    ORT Risk Level calculation:    ORT Scoring interpretation table:  Score <3 = Low Risk for SUD  Score between 4-7 = Moderate Risk for SUD  Score >8 = High Risk for Opioid Abuse   PHQ-2 Depression Scale:  Total score:    PHQ-2 Scoring interpretation table: (Score and probability of major depressive disorder)  Score 0 = No depression  Score 1 = 15.4% Probability  Score 2 = 21.1% Probability  Score 3 = 38.4% Probability  Score 4 = 45.5% Probability  Score 5 = 56.4% Probability  Score 6 = 78.6% Probability   PHQ-9 Depression Scale:  Total score:    PHQ-9 Scoring interpretation table:  Score 0-4 = No depression  Score 5-9 = Mild depression  Score 10-14 = Moderate depression  Score 15-19 = Moderately severe depression   Score 20-27 = Severe depression (2.4 times higher risk of SUD and 2.89 times higher risk of overuse)   Pharmacologic Plan: As per protocol, I have not taken over any controlled substance management, pending the results of ordered tests and/or consults.            Initial impression: Pending review of available data and ordered tests.  Meds   Current Outpatient Medications:    acetaminophen (TYLENOL) 500 MG tablet, Take 1,000 mg by mouth 2 (two) times daily as needed for moderate pain or headache. , Disp: , Rfl:    ALBUTEROL IN, Inhale into the lungs., Disp: , Rfl:    atorvastatin (LIPITOR) 40 MG tablet, Take 40 mg by mouth daily., Disp: , Rfl:    calcium-vitamin D (OSCAL WITH D) 500-200 MG-UNIT tablet, Take 1 tablet by mouth., Disp: , Rfl:    dicyclomine (BENTYL) 10 MG capsule, Take 10 mg by mouth 2 (two) times daily as needed for spasms., Disp: , Rfl:    DULoxetine (CYMBALTA) 20 MG capsule, Take 40 mg by mouth daily., Disp: , Rfl:    DULoxetine (CYMBALTA) 60 MG capsule, Take 60 mg by mouth  every evening. , Disp: , Rfl:    estradiol (ESTRACE) 0.5 MG tablet, Take 1 mg by mouth daily. , Disp: , Rfl:    furosemide (LASIX) 20 MG tablet, Take 20 mg by mouth daily as needed., Disp: , Rfl:    gabapentin (NEURONTIN) 100 MG capsule, Take by mouth 2 (two) times daily., Disp: , Rfl:    Hypromellose (ARTIFICIAL TEARS OP), Place 1-2 drops into both eyes daily as needed (for dry eyes)., Disp: , Rfl:    lisinopril (PRINIVIL,ZESTRIL) 10 MG tablet, Take 10 mg by mouth daily., Disp: , Rfl:    loperamide (IMODIUM) 2 MG capsule, Take by mouth as needed for diarrhea or Benton stools., Disp: , Rfl:    losartan (COZAAR) 50 MG tablet, Take 50 mg by mouth in the morning and at bedtime., Disp: , Rfl:    naproxen sodium (ALEVE) 220 MG tablet, Take 220 mg by mouth daily as needed., Disp: , Rfl:    omeprazole (PRILOSEC) 20 MG capsule, Take 20 mg by mouth daily., Disp: , Rfl:    OXcarbazepine (TRILEPTAL) 150 MG tablet,  Take 150 mg by mouth 2 (two) times daily., Disp: , Rfl:    polycarbophil (FIBERCON) 625 MG tablet, Take 625 mg by mouth daily., Disp: , Rfl:    potassium chloride SA (KLOR-CON) 20 MEQ tablet, Take 20 mEq by mouth 2 (two) times daily., Disp: , Rfl:    temazepam (RESTORIL) 30 MG capsule, Take 30 mg by mouth at bedtime., Disp: , Rfl:    traMADol (ULTRAM) 50 MG tablet, Take 1 tablet (50 mg total) by mouth 2 (two) times daily as needed for severe pain. Must last 30 days (Patient not taking: Reported on 08/29/2020), Disp: 60 tablet, Rfl: 2   TURMERIC PO, Take 900 mg by mouth daily., Disp: , Rfl:   Imaging Review  Thoracic Imaging: Thoracic DG 2-3 views: Results for orders placed during the hospital encounter of 05/16/20 DG Thoracic Spine 2 View  Narrative CLINICAL DATA:  Thoracic back pain.  EXAM: THORACIC SPINE 2 VIEWS  COMPARISON:  MRI lumbar spine 04/17/2016.  Thoracic CT 05/19/2014  FINDINGS: Mild dextroscoliosis midthoracic spine. Negative for thoracic fracture or mass.  Chronic compression fracture L2 unchanged from 2017 lumbar MRI. Lumbar spinal fusion hardware noted.  IMPRESSION: Negative for thoracic fracture.  Mild scoliosis.   Electronically Signed By: Marlan Palau M.D. On: 05/17/2020 14:23  Lumbosacral Imaging: Lumbar MR wo contrast: Results for orders placed during the hospital encounter of 04/17/16 MR LUMBAR SPINE WO CONTRAST  Narrative CLINICAL DATA:  Lumbar radiculopathy. Low back pain with bilateral leg and buttock pain. Numbness in the left foot and big toe. Lifting injury in August. Prior lumbar surgery.  EXAM: MRI LUMBAR SPINE WITHOUT CONTRAST  TECHNIQUE: Multiplanar, multisequence MR imaging of the lumbar spine was performed. No intravenous contrast was administered.  COMPARISON:  Lumbar spine MRI 08/19/2011 and CT 05/19/2014  FINDINGS: Segmentation:  Standard.  Alignment: Mild lumbar levoscoliosis. Trace anterolisthesis of L3  on L4.  Vertebrae: Chronic L2 compression fracture with slight retropulsion, unchanged from the prior CT. No evidence of new fracture, destructive osseous lesion, or significant marrow edema. Interval L3-L5 posterior fusion and L4-5 interbody fusion.  Conus medullaris: Extends to the T12-L1 level and appears normal.  Paraspinal and other soft tissues: Dorsal epidural fluid collection at the laminectomy sites extending from L3-L5 and measuring 1.5 cm in AP thickness and approximately 5 cm in left-to-right diameter without mass effect on the thecal sac. Partially visualized left  renal cyst.  Disc levels:  Disc desiccation throughout the lumbar spine. Mild disc space narrowing at L1-2 with severe narrowing at L3-4.  T12-L1:  Negative.  L1-2: Mild L2 retropulsion and mild disc bulging without significant stenosis, unchanged from the prior CT.  L2-3:  Minimal disc bulging without stenosis, unchanged.  L3-4:  Interval posterior decompression and fusion.  No stenosis.  L4-5:  Interval posterior decompression and fusion.  No stenosis.  L5-S1: Mild disc bulging and shallow left foraminal disc protrusion result in mild left neural foraminal stenosis, with assessment mildly limited by magnetic susceptibility artifact. The foraminal disc protrusion has decreased in size from the prior CT.  IMPRESSION: 1. Interval L3-L5 fusion without residual stenosis. 2. Mild left neural foraminal stenosis at L5-S1. Disc protrusion has decreased in size since 2015. 3. Chronic L2 compression fracture.   Electronically Signed By: Sebastian Ache M.D. On: 04/17/2016 16:15  Lumbar DG Bending views: Results for orders placed during the hospital encounter of 08/07/20 DG Lumbar Spine Complete W/Bend  Narrative CLINICAL DATA:  Chronic low back pain.  EXAM: LUMBAR SPINE - COMPLETE WITH BENDING VIEWS  COMPARISON:  May 16, 2020.  FINDINGS: Status post surgical posterior fusion of L3-4 and L4-5  with bilateral intrapedicular screw placement and interbody fusion of L4-5. There appears to be near fusion of the L3-4 disc space due to degenerative change. Stable moderate compression deformity of L2 vertebral body is noted consistent with old fracture. No acute fracture is noted. Stable minimal grade 1 retrolisthesis of L2-3 is noted secondary to severe degenerative disc disease at this level. No change in vertebral body alignment is noted on flexion or extension views.  IMPRESSION: Stable postsurgical and degenerative changes as described above. No acute abnormality seen in the lumbar spine. No change in vertebral body alignment is noted on flexion or extension views.   Electronically Signed By: Lupita Raider M.D. On: 08/08/2020 09:16  Hip Imaging: Hip-R DG 2-3 views: Results for orders placed during the hospital encounter of 05/16/20 DG HIP UNILAT W OR W/O PELVIS 2-3 VIEWS RIGHT  Narrative CLINICAL DATA:  Right hip pain  EXAM: DG HIP (WITH OR WITHOUT PELVIS) 2-3V RIGHT  COMPARISON:  None.  FINDINGS: No hip fracture or bone lesion.  Hip joint space normal.  No pelvic bony lesion. Lumbar fusion with pedicle screw and interbody fusion and posterior bony fusion.  IMPRESSION: Negative right hip   Electronically Signed By: Marlan Palau M.D. On: 05/17/2020 14:18  Hip-L DG 2-3 views: Results for orders placed during the hospital encounter of 05/16/20 DG HIP UNILAT W OR W/O PELVIS 2-3 VIEWS LEFT  Narrative CLINICAL DATA:  Right hip pain.  Fall July 2021.  EXAM: DG HIP (WITH OR WITHOUT PELVIS) 2-3V LEFT  COMPARISON:  None.  FINDINGS: There is no evidence of hip fracture or dislocation. There is no evidence of arthropathy or other focal bone abnormality.  IMPRESSION: Negative.   Electronically Signed By: Marlan Palau M.D. On: 05/17/2020 14:19  Knee Imaging: Knee-L DG 1-2 views: Results for orders placed during the hospital encounter of  05/16/20 DG Knee 1-2 Views Left  Narrative CLINICAL DATA:  Knee pain.  Recent falls  EXAM: LEFT KNEE - 1-2 VIEW  COMPARISON:  None.  FINDINGS: Normal alignment no fracture. No joint effusion. Joint spaces maintained  Small soft tissue calcifications anterior knee above and below the patella appear benign. Possible phleboliths.  IMPRESSION: Negative.   Electronically Signed By: Marlan Palau M.D. On: 05/17/2020  14:20  Foot Imaging: Foot-R DG Complete: Results for orders placed during the hospital encounter of 12/17/19 DG Foot Complete Right  Narrative CLINICAL DATA:  Larey Seat today, right foot pain laterally  EXAM: RIGHT FOOT COMPLETE - 3+ VIEW  COMPARISON:  None.  FINDINGS: Frontal, oblique, and lateral views of the right foot are obtained. No fracture, subluxation, or dislocation. Joint spaces are well preserved. Soft tissues are normal. There is a small inferior calcaneal spur.  IMPRESSION: 1. No acute bony abnormality.   Electronically Signed By: Sharlet Salina M.D. On: 12/17/2019 21:35  Complexity Note: Imaging results reviewed.                         ROS  Cardiovascular: {Hx; Cardiovascular History:210120525} Pulmonary or Respiratory: {Hx; Pumonary and/or Respiratory History:210120523} Neurological: {Hx; Neurological:210120504} Psychological-Psychiatric: {Hx; Psychological-Psychiatric History:210120512} Gastrointestinal: {Hx; Gastrointestinal:210120527} Genitourinary: {Hx; Genitourinary:210120506} Hematological: {Hx; Hematological:210120510} Endocrine: {Hx; Endocrine history:210120509} Rheumatologic: {Hx; Rheumatological:210120530} Musculoskeletal: {Hx; Musculoskeletal:210120528} Work History: {Hx; Work history:210120514}  Allergies  Kendra Benton is allergic to spironolactone, hydrochlorothiazide, ciprofloxacin, hydralazine, levofloxacin, amlodipine, and latex.  Laboratory Chemistry Profile   Renal Lab Results  Component Value Date   BUN  12 01/11/2021   CREATININE 0.73 01/11/2021   BCR 25 05/16/2020   GFRAA 79 05/16/2020   GFRNONAA >60 01/11/2021   PROTEINUR NEGATIVE 08/27/2018     Electrolytes Lab Results  Component Value Date   NA 130 (L) 01/11/2021   K 2.9 (L) 01/11/2021   CL 92 (L) 01/11/2021   CALCIUM 9.1 01/11/2021   MG 1.7 05/16/2020     Hepatic Lab Results  Component Value Date   AST 16 05/16/2020   ALT 17 08/27/2018   ALBUMIN 4.1 05/16/2020   ALKPHOS 86 05/16/2020   LIPASE 32 08/27/2018     ID Lab Results  Component Value Date   SARSCOV2NAA Not Detected 05/13/2019   STAPHAUREUS NEGATIVE 12/28/2017   MRSAPCR NEGATIVE 12/28/2017     Bone Lab Results  Component Value Date   25OHVITD1 35 05/16/2020   25OHVITD2 <1.0 05/16/2020   25OHVITD3 35 05/16/2020     Endocrine Lab Results  Component Value Date   GLUCOSE 98 01/11/2021   GLUCOSEU NEGATIVE 08/27/2018     Neuropathy Lab Results  Component Value Date   VITAMINB12 330 05/16/2020     CNS No results found for: "COLORCSF", "APPEARCSF", "RBCCOUNTCSF", "WBCCSF", "POLYSCSF", "LYMPHSCSF", "EOSCSF", "PROTEINCSF", "GLUCCSF", "JCVIRUS", "CSFOLI", "IGGCSF", "LABACHR", "ACETBL"   Inflammation (CRP: Acute  ESR: Chronic) Lab Results  Component Value Date   CRP 1 05/16/2020   ESRSEDRATE 2 05/16/2020   LATICACIDVEN 1.5 02/27/2016     Rheumatology No results found for: "RF", "ANA", "LABURIC", "URICUR", "LYMEIGGIGMAB", "LYMEABIGMQN", "HLAB27"   Coagulation Lab Results  Component Value Date   INR 0.97 06/17/2018   LABPROT 12.8 06/17/2018   APTT 29 06/17/2018   PLT 475 (H) 01/11/2021     Cardiovascular Lab Results  Component Value Date   TROPONINI <0.03 06/11/2017   HGB 11.2 (L) 01/11/2021   HCT 34.4 (L) 01/11/2021     Screening Lab Results  Component Value Date   SARSCOV2NAA Not Detected 05/13/2019   STAPHAUREUS NEGATIVE 12/28/2017   MRSAPCR NEGATIVE 12/28/2017     Cancer No results found for: "CEA", "CA125", "LABCA2"    Allergens No results found for: "ALMOND", "APPLE", "ASPARAGUS", "AVOCADO", "BANANA", "BARLEY", "BASIL", "BAYLEAF", "GREENBEAN", "LIMABEAN", "WHITEBEAN", "BEEFIGE", "REDBEET", "BLUEBERRY", "BROCCOLI", "CABBAGE", "MELON", "CARROT", "CASEIN", "CASHEWNUT", "CAULIFLOWER", "CELERY"     Note: Lab results reviewed.  PFSH  Drug: Kendra Benton  reports no history of drug use. Alcohol:  reports current alcohol use of about 10.0 standard drinks of alcohol per week. Tobacco:  reports that she quit smoking about 44 years ago. Her smoking use included cigarettes. She started smoking about 64 years ago. She has a 20 pack-year smoking history. She has never used smokeless tobacco. Medical:  has a past medical history of Allergy, Anxiety, GERD (gastroesophageal reflux disease), Headache, History of kidney stones, Hypertension, Myocardial infarction (HCC), Osteoporosis, and Pneumonia. Family: family history includes Arthritis in her mother; Cancer (age of onset: 54) in her sister; Cancer (age of onset: 64) in her father and mother; Heart disease in her father and mother.  Past Surgical History:  Procedure Laterality Date   ABDOMINAL HYSTERECTOMY     patient has ovaries   APPENDECTOMY     ARTERY BIOPSY Right 12/30/2017   Procedure: BIOPSY TEMPORAL ARTERY;  Surgeon: Renford Dills, MD;  Location: [facility] ORS;  Service: Vascular;  Laterality: Right;   ARTERY BIOPSY Left 06/18/2018   Procedure: BIOPSY TEMPORAL ARTERY;  Surgeon: Renford Dills, MD;  Location: [facility] ORS;  Service: Vascular;  Laterality: Left;   BACK SURGERY     CARDIAC CATHETERIZATION     CATARACT EXTRACTION W/ INTRAOCULAR LENS  IMPLANT, BILATERAL Bilateral 2010   CHOLECYSTECTOMY N/A 02/28/2016   Procedure: LAPAROSCOPIC CHOLECYSTECTOMY WITH INTRAOPERATIVE CHOLANGIOGRAM;  Surgeon: Tiney Rouge III, MD;  Location: [facility] ORS;  Service: General;  Laterality: N/A;   LUMBAR FUSION  11/09/2016   L3-L5   SPINE SURGERY     Disc   TONSILLECTOMY     Active  Ambulatory Problems    Diagnosis Date Noted   Cholecystitis with cholelithiasis 02/27/2016   Depression 04/23/2010   Dupuytren's contracture 07/18/2013   Fibromyalgia 04/23/2010   History of night sweats 05/01/2014   Hypercholesterolemia 04/23/2010   Essential hypertension 04/23/2010   Hypothyroidism 04/23/2010   Inverted nipple 04/18/2013   Irritable colon 04/23/2010   Osteoarthritis 04/23/2010   Increased frequency of urination 01/27/2015   Head ache 12/03/2017   Anxiety 06/15/2017   Atypical chest pain 06/15/2017   Carcinoma, lung, 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/25/2019   Chronic low back pain (1ry area of Pain) (Bilateral) (L>R) w/o sciatica 08/17/2017   Mass of upper lobe of right lung 01/06/2019   Panic attack as reaction to stress 08/17/2017   S/P partial lobectomy of lung 02/25/2019   Sinus bradycardia 10/06/2017   Hyperlipidemia 06/15/2017   Chronic nonintractable headache 06/14/2018   Chronic pain syndrome 05/16/2020   Pharmacologic therapy 05/16/2020   Disorder of skeletal system 05/16/2020   Problems influencing health status 05/16/2020   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   Chronic hip pain (2ry area of Pain) (Bilateral) (R>L) 05/16/2020   Chronic lower extremity pain (Left) 05/16/2020   Numbness and tingling of both feet (intermittent/recurrent) 05/16/2020   Pain in rib (Bilateral) (R>L) (intermittent) 05/16/2020   Chronic knee pain (Left) 05/16/2020   Patellar pain (Left) (chronic, intermittent) 05/16/2020   Hard of hearing 05/16/2020   COVID 06/04/2020   Lumbar facet joint syndrome 06/18/2020   Spondylosis without myelopathy or radiculopathy, lumbosacral region 06/18/2020   DDD (degenerative disc disease), lumbosacral 06/18/2020   Fall (on) (from) other stairs and steps, initial encounter 08/06/2020   Resolved Ambulatory Problems    Diagnosis Date Noted   No Resolved Ambulatory Problems   Past Medical History:  Diagnosis Date   Allergy    GERD (gastroesophageal  reflux disease)  Headache    History of kidney stones    Hypertension    Myocardial infarction (HCC)    Osteoporosis    Pneumonia    Constitutional Exam  General appearance: Well nourished, well developed, and well hydrated. In no apparent acute distress There were no vitals filed for this visit. BMI Assessment: Estimated body mass index is 22.31 kg/m as calculated from the following:   Height as of 06/24/21: 5\' 2"  (1.575 m).   Weight as of 06/24/21: 122 lb (55.3 kg).  BMI interpretation table: BMI level Category Range association with higher incidence of chronic pain  <18 kg/m2 Underweight   18.5-24.9 kg/m2 Ideal body weight   25-29.9 kg/m2 Overweight Increased incidence by 20%  30-34.9 kg/m2 Obese (Class I) Increased incidence by 68%  35-39.9 kg/m2 Severe obesity (Class II) Increased incidence by 136%  >40 kg/m2 Extreme obesity (Class III) Increased incidence by 254%   Patient's current BMI Ideal Body weight  There is no height or weight on file to calculate BMI. Patient weight not recorded   BMI Readings from Last 4 Encounters:  06/24/21 22.31 kg/m  01/11/21 21.95 kg/m  08/06/20 22.86 kg/m  07/17/20 22.86 kg/m   Wt Readings from Last 4 Encounters:  06/24/21 122 lb (55.3 kg)  01/11/21 120 lb (54.4 kg)  08/06/20 125 lb (56.7 kg)  07/17/20 125 lb (56.7 kg)    Psych/Mental status: Alert, oriented x 3 (person, place, & time)       Eyes: PERLA Respiratory: No evidence of acute respiratory distress  Assessment  Primary Diagnosis & Pertinent Problem List: {There were no encounter diagnoses. (Refresh or delete this SmartLink)}  Visit Diagnosis (New problems to examiner): No diagnosis found. Plan of Care (Initial workup plan)  Note: Kendra Benton was reminded that as per protocol, today's visit has been an evaluation only. We have not taken over the patient's controlled substance management.  Problem-specific plan: No problem-specific Assessment & Plan notes found  for this encounter.  Lab Orders  No laboratory test(s) ordered today   Imaging Orders  No imaging studies ordered today   Referral Orders  No referral(s) requested today   Procedure Orders    No procedure(s) ordered today   Pharmacotherapy (current): Medications ordered:  No orders of the defined types were placed in this encounter.  Medications administered during this visit: Kendra Benton "Vance Gather" had no medications administered during this visit.   Analgesic Pharmacotherapy:  Opioid Analgesics: For patients currently taking or requesting to take opioid analgesics, in accordance with [facility] Guidelines, we will assess their risks and indications for the use of these substances. After completing our evaluation, we may offer recommendations, but we no longer take patients for medication management. The prescribing physician will ultimately decide, based on his/her training and level of comfort whether to adopt any of the recommendations, including whether or not to prescribe such medicines.  Membrane stabilizer: To be determined at a later time  Muscle relaxant: To be determined at a later time  NSAID: To be determined at a later time  Other analgesic(s): To be determined at a later time   Interventional management options: Kendra Benton was informed that there is no guarantee that she would be a candidate for interventional therapies. The decision will be based on the results of diagnostic studies, as well as Kendra Benton's risk profile.  Procedure(s) under consideration:  Pending results of ordered studies      Interventional Therapies  Risk Factors  Considerations  Medical Comorbidities:     Planned  Pending:      Under consideration:   Pending   Completed:   Diagnostic bilateral lumbar facet (L3-4, L4-5, and L5-S1) MBB (L2-S1) x1 (06/19/2020) (100/100/80/80)    Therapeutic  Palliative (PRN) options:   None established   Completed by  other providers:   Therapeutic bilateral SI joint inj. x2 (08/24/2019, 12/16/2019) by Windell Norfolk, MD [facility] PMR)  Therapeutic right lumbar L4/L5 and L5/S1 peri-articular/peri-hardware inj. x1 (03/11/2019) by Windell Norfolk, MD [facility] PMR)  Therapeutic left IA hip inj. x1 (05/14/2018) by Windell Norfolk, MD [facility] PMR)  Therapeutic right lumbar facet (L4, L5, S1) MBB x1 (04/02/2018) by Windell Norfolk, MD [facility] PMR)  Therapeutic right lumbar facet (L5, S1) MBB x1 (02/19/2018) by Windell Norfolk, MD [facility] PMR)  Therapeutic right SI joint inj. x1 (12/11/2017) by Windell Norfolk, MD [facility] PMR)  Therapeutic right hip bursa inj. x1 (12/11/2017) by Windell Norfolk, MD [facility] PMR)        Provider-requested follow-up: No follow-ups on file.  Future Appointments  Date Time Provider Department Center  03/04/2023  2:00 PM Delano Metz, MD [facility] None  03/10/2023 11:45 AM Lenda Kelp, PT [facility] None  03/12/2023  4:15 PM Helane Rima, Merry Lofty, PT [facility] None  03/17/2023 11:45 AM Lenda Kelp, PT [facility] None  03/20/2023 10:15 AM Lenda Kelp, PT [facility] None  03/24/2023 11:45 AM Lenda Kelp, PT [facility] None  03/26/2023  4:15 PM Westbrooks, Merry Lofty, PT [facility] None  03/30/2023  1:15 PM Lenda Kelp, PT [facility] None  04/01/2023  2:00 PM Westbrooks, Merry Lofty, PT [facility] None  04/06/2023  1:15 PM Westbrooks, Merry Lofty, PT [facility] None  04/08/2023  2:00 PM Westbrooks, Merry Lofty, PT [facility] None  04/13/2023  1:15 PM Westbrooks, Merry Lofty, PT [facility] None  04/15/2023  2:00 PM Westbrooks, Merry Lofty, PT [facility] None  04/20/2023  1:15 PM Westbrooks, Merry Lofty, PT [facility] None  04/22/2023  2:00 PM Westbrooks, Merry Lofty, PT [facility] None  04/27/2023  1:15 PM Westbrooks, Merry Lofty, PT [facility] None  04/29/2023  2:00 PM Westbrooks, Merry Lofty, PT [facility] None  05/04/2023  1:15 PM Lenda Kelp, PT [facility] None  05/06/2023  2:00 PM  Lenda Kelp, PT [facility] None  05/11/2023  1:15 PM Lenda Kelp, PT [facility] None  05/13/2023  2:00 PM Lenda Kelp, PT [facility] None  05/18/2023  1:15 PM Lenda Kelp, PT [facility] None  05/20/2023  2:00 PM Lenda Kelp, PT [facility] None  05/25/2023  1:15 PM Lenda Kelp, PT [facility] None  05/27/2023  2:00 PM Westbrooks, Merry Lofty, PT [facility] None    Duration of encounter: *** minutes.  Total time on encounter, as per AMA guidelines included both the face-to-face and non-face-to-face time personally spent by the physician and/or other qualified health care professional(s) on the day of the encounter (includes time in activities that require the physician or other qualified health care professional and does not include time in activities normally performed by clinical staff). Physician's time may include the following activities when performed: Preparing to see the patient (e.g., pre-charting review of records, searching for previously ordered imaging, lab work, and nerve conduction tests) Review of prior analgesic pharmacotherapies. Reviewing PMP Interpreting ordered tests (e.g., lab work, imaging, nerve conduction tests) Performing post-procedure evaluations, including interpretation of diagnostic procedures Obtaining and/or reviewing separately obtained history Performing a medically appropriate examination and/or evaluation Counseling and educating the patient/family/caregiver Ordering medications, tests,  or procedures Referring and communicating with other health care professionals (when not separately reported) Documenting clinical information in the electronic or other health record Independently interpreting results (not separately reported) and communicating results to the patient/ family/caregiver Care coordination (not separately reported)  Note by: Oswaldo Done, MD (TTS technology used. I apologize for any  typographical errors that were not detected and corrected.) Date: 03/04/2023; Time: 8:10 AM

## 2023-03-04 ENCOUNTER — Ambulatory Visit: Payer: Medicare HMO

## 2023-03-04 ENCOUNTER — Ambulatory Visit
Admission: RE | Admit: 2023-03-04 | Discharge: 2023-03-04 | Disposition: A | Discharge status: Home / Self Care | Payer: Medicare HMO | Source: Ambulatory Visit | Attending: Pain Medicine | Admitting: Pain Medicine

## 2023-03-04 ENCOUNTER — Ambulatory Visit: Payer: Medicare HMO | Admitting: Pain Medicine

## 2023-03-04 ENCOUNTER — Encounter: Payer: Self-pay | Admitting: Pain Medicine

## 2023-03-04 VITALS — BP 148/68 | Temp 97.7°F | Ht 62.0 in | Wt 130.0 lb

## 2023-03-04 DIAGNOSIS — M51379 Other intervertebral disc degeneration, lumbosacral region without mention of lumbar back pain or lower extremity pain: Secondary | ICD-10-CM

## 2023-03-04 DIAGNOSIS — M47817 Spondylosis without myelopathy or radiculopathy, lumbosacral region: Secondary | ICD-10-CM

## 2023-03-04 DIAGNOSIS — M961 Postlaminectomy syndrome, not elsewhere classified: Secondary | ICD-10-CM | POA: Insufficient documentation

## 2023-03-04 DIAGNOSIS — M545 Low back pain, unspecified: Secondary | ICD-10-CM

## 2023-03-04 DIAGNOSIS — G894 Chronic pain syndrome: Secondary | ICD-10-CM | POA: Insufficient documentation

## 2023-03-04 DIAGNOSIS — S32020S Wedge compression fracture of second lumbar vertebra, sequela: Secondary | ICD-10-CM | POA: Insufficient documentation

## 2023-03-04 DIAGNOSIS — M5137 Other intervertebral disc degeneration, lumbosacral region: Secondary | ICD-10-CM | POA: Insufficient documentation

## 2023-03-04 DIAGNOSIS — G8929 Other chronic pain: Secondary | ICD-10-CM | POA: Insufficient documentation

## 2023-03-04 DIAGNOSIS — Z789 Other specified health status: Secondary | ICD-10-CM

## 2023-03-04 DIAGNOSIS — M899 Disorder of bone, unspecified: Secondary | ICD-10-CM | POA: Insufficient documentation

## 2023-03-04 DIAGNOSIS — M5459 Other low back pain: Secondary | ICD-10-CM

## 2023-03-04 DIAGNOSIS — T85848S Pain due to other internal prosthetic devices, implants and grafts, sequela: Secondary | ICD-10-CM | POA: Insufficient documentation

## 2023-03-04 DIAGNOSIS — M47816 Spondylosis without myelopathy or radiculopathy, lumbar region: Secondary | ICD-10-CM | POA: Diagnosis present

## 2023-03-04 DIAGNOSIS — Z79899 Other long term (current) drug therapy: Secondary | ICD-10-CM

## 2023-03-04 NOTE — Progress Notes (Signed)
Safety precautions to be maintained throughout the outpatient stay will include: orient to surroundings, keep bed in low position, maintain call bell within reach at all times, provide assistance with transfer out of bed and ambulation.  

## 2023-03-04 NOTE — Patient Instructions (Addendum)
Pain Management Discharge Instructions  General Discharge Instructions :  If you need to reach your doctor call: Monday-Friday 8:00 am - 4:00 pm at [phone] or toll free [phone].  After clinic hours [phone] to have operator reach doctor.  Bring all of your medication bottles to all your appointments in the pain clinic.  To cancel or reschedule your appointment with Pain Management please remember to call 24 hours in advance to avoid a fee.  Refer to the educational materials which you have been given on: General Risks, I had my Procedure. Discharge Instructions, Post Sedation.  Post Procedure Instructions:  The drugs you were given will stay in your system until tomorrow, so for the next 24 hours you should not drive, make any legal decisions or drink any alcoholic beverages.  You may eat anything you prefer, but it is better to start with liquids then soups and crackers, and gradually work up to solid foods.  Please notify your doctor immediately if you have any unusual bleeding, trouble breathing or pain that is not related to your normal pain.  Depending on the type of procedure that was done, some parts of your body may feel week and/or numb.  This usually clears up by tonight or the next day.  Walk with the use of an assistive device or accompanied by an adult for the 24 hours.  You may use ice on the affected area for the first 24 hours.  Put ice in a Ziploc bag and cover with a towel and place against area 15 minutes on 15 minutes off.  You may switch to heat after 24 hours.Facet Blocks Patient Information  Description: The facets are joints in the spine between the vertebrae.  Like any joints in the body, facets can become irritated and painful.  Arthritis can also effect the facets.  By injecting steroids and local anesthetic in and around these joints, we can temporarily block the nerve supply to them.  Steroids act directly on irritated nerves and tissues to  reduce selling and inflammation which often leads to decreased pain.  Facet blocks may be done anywhere along the spine from the neck to the low back depending upon the location of your pain.   After numbing the skin with local anesthetic (like Novocaine), a small needle is passed onto the facet joints under x-ray guidance.  You may experience a sensation of pressure while this is being done.  The entire block usually lasts about 15-25 minutes.   Conditions which may be treated by facet blocks:  Low back/buttock pain Neck/shoulder pain Certain types of headaches  Preparation for the injection:  Do not eat any solid food or dairy products within 8 hours of your appointment. You may drink clear liquid up to 3 hours before appointment.  Clear liquids include water, black coffee, juice or soda.  No milk or cream please. You may take your regular medication, including pain medications, with a sip of water before your appointment.  Diabetics should hold regular insulin (if taken separately) and take 1/2 normal NPH dose the morning of the procedure.  Carry some sugar containing items with you to your appointment. A driver must accompany you and be prepared to drive you home after your procedure. Bring all your current medications with you. An IV may be inserted and sedation may be given at the discretion of the physician. A blood pressure cuff, EKG and other monitors will often be applied during the procedure.  Some patients may need to  have extra oxygen administered for a short period. You will be asked to provide medical information, including your allergies and medications, prior to the procedure.  We must know immediately if you are taking blood thinners (like Coumadin/Warfarin) or if you are allergic to IV iodine contrast (dye).  We must know if you could possible be pregnant.  Possible side-effects:  Bleeding from needle site Infection (rare, may require surgery) Nerve injury (rare) Numbness  & tingling (temporary) Difficulty urinating (rare, temporary) Spinal headache (a headache worse with upright posture) Light-headedness (temporary) Pain at injection site (serveral days) Decreased blood pressure (rare, temporary) Weakness in arm/leg (temporary) Pressure sensation in back/neck (temporary)   Call if you experience:  Fever/chills associated with headache or increased back/neck pain Headache worsened by an upright position New onset, weakness or numbness of an extremity below the injection site Hives or difficulty breathing (go to the emergency room) Inflammation or drainage at the injection site(s) Severe back/neck pain greater than usual New symptoms which are concerning to you  Please note:  Although the local anesthetic injected can often make your back or neck feel good for several hours after the injection, the pain will likely return. It takes 3-7 days for steroids to work.  You may not notice any pain relief for at least one week.  If effective, we will often do a series of 2-3 injections spaced 3-6 weeks apart to maximally decrease your pain.  After the initial series, you may be a candidate for a more permanent nerve block of the facets.  If you have any questions, please call #[phone] [facility] ______________________________________________________________________    Procedure instructions  Stop blood-thinners  Do not eat or drink fluids (other than water) for 6 hours before your procedure  No water for 2 hours before your procedure  Take your blood pressure medicine with a sip of water  Arrive 30 minutes before your appointment  If sedation is planned, bring suitable driver. Pennie Banter, Benedetto Goad, & public transportation are NOT APPROVED)  Carefully read the "Preparing for your procedure" detailed instructions  If you have questions call us at [phone]  [phone]  ______________________________________________________________________      ______________________________________________________________________    Preparing for your procedure  Appointments: If you think you may not be able to keep your appointment, call 24-48 hours in advance to cancel. We need time to make it available to others.  During your procedure appointment there will be: No Prescription Refills. No disability issues to discussed. No medication changes or discussions.  Instructions: Food intake: Avoid eating anything solid for at least 8 hours prior to your procedure. Clear liquid intake: You may take clear liquids such as water up to 2 hours prior to your procedure. (No carbonated drinks. No soda.) Transportation: Unless otherwise stated by your physician, bring a driver. (Driver cannot be a Market researcher, Pharmacist, community, or any other form of public transportation.) Morning Medicines: Except for blood thinners, take all of your other morning medications with a sip of water. Make sure to take your heart and blood pressure medicines. If your blood pressure's lower number is above 100, the case will be rescheduled. Blood thinners: Make sure to stop your blood thinners as instructed.  If you take a blood thinner, but were not instructed to stop it, call our office [phone] and ask to talk to a nurse. Not stopping a blood thinner prior to certain procedures could lead to serious complications. Diabetics on insulin: Notify the staff so  that you can be scheduled 1st case in the morning. If your diabetes requires high dose insulin, take only  of your normal insulin dose the morning of the procedure and notify the staff that you have done so. Preventing infections: Shower with an antibacterial soap the morning of your procedure.  Build-up your immune system: Take 1000 mg of Vitamin C with every meal (3 times a day) the day prior to your procedure. Antibiotics: Inform the nursing staff if  you are taking any antibiotics or if you have any conditions that may require antibiotics prior to procedures. (Example: recent joint implants)   Pregnancy: If you are pregnant make sure to notify the nursing staff. Not doing so may result in injury to the fetus, including death.  Sickness: If you have a cold, fever, or any active infections, call and cancel or reschedule your procedure. Receiving steroids while having an infection may result in complications. Arrival: You must be in the facility at least 30 minutes prior to your scheduled procedure. Tardiness: Your scheduled time is also the cutoff time. If you do not arrive at least 15 minutes prior to your procedure, you will be rescheduled.  Children: Do not bring any children with you. Make arrangements to keep them home. Dress appropriately: There is always a possibility that your clothing may get soiled. Avoid long dresses. Valuables: Do not bring any jewelry or valuables.  Reasons to call and reschedule or cancel your procedure: (Following these recommendations will minimize the risk of a serious complication.) Surgeries: Avoid having procedures within 2 weeks of any surgery. (Avoid for 2 weeks before or after any surgery). Flu Shots: Avoid having procedures within 2 weeks of a flu shots or . (Avoid for 2 weeks before or after immunizations). Barium: Avoid having a procedure within 7-10 days after having had a radiological study involving the use of radiological contrast. (Myelograms, Barium swallow or enema study). Heart attacks: Avoid any elective procedures or surgeries for the initial 6 months after a "Myocardial Infarction" (Heart Attack). Blood thinners: It is imperative that you stop these medications before procedures. Let us know if you if you take any blood thinner.  Infection: Avoid procedures during or within two weeks of an infection (including chest colds or gastrointestinal problems). Symptoms associated with infections include:  Localized redness, fever, chills, night sweats or profuse sweating, burning sensation when voiding, cough, congestion, stuffiness, runny nose, sore throat, diarrhea, nausea, vomiting, cold or Flu symptoms, recent or current infections. It is specially important if the infection is over the area that we intend to treat. Heart and lung problems: Symptoms that may suggest an active cardiopulmonary problem include: cough, chest pain, breathing difficulties or shortness of breath, dizziness, ankle swelling, uncontrolled high or unusually low blood pressure, and/or palpitations. If you are experiencing any of these symptoms, cancel your procedure and contact your primary care physician for an evaluation.  Remember:  Regular Business hours are:  Monday to Thursday 8:00 AM to 4:00 PM  Provider's Schedule: Delano Metz, MD:  Procedure days: Tuesday and Thursday 7:30 AM to 4:00 PM  Edward Jolly, MD:  Procedure days: Monday and Wednesday 7:30 AM to 4:00 PM Last  Updated: 01/27/2023 ______________________________________________________________________      ______________________________________________________________________    General Risks and Possible Complications  Patient Responsibilities: It is important that you read this as it is part of your informed consent. It is our duty to inform you of the risks and possible complications associated with treatments offered to you. It  is your responsibility as a patient to read this and to ask questions about anything that is not clear or that you believe was not covered in this document.  Patient's Rights: You have the right to refuse treatment. You also have the right to change your mind, even after initially having agreed to have the treatment done. However, under this last option, if you wait until the last second to change your mind, you may be charged for the materials used up to that point.  Introduction: Medicine is not an Visual merchandiser.  Everything in Medicine, including the lack of treatment(s), carries the potential for danger, harm, or loss (which is by definition: Risk). In Medicine, a complication is a secondary problem, condition, or disease that can aggravate an already existing one. All treatments carry the risk of possible complications. The fact that a side effects or complications occurs, does not imply that the treatment was conducted incorrectly. It must be clearly understood that these can happen even when everything is done following the highest safety standards.  No treatment: You can choose not to proceed with the proposed treatment alternative. The "PRO(s)" would include: avoiding the risk of complications associated with the therapy. The "CON(s)" would include: not getting any of the treatment benefits. These benefits fall under one of three categories: diagnostic; therapeutic; and/or palliative. Diagnostic benefits include: getting information which can ultimately lead to improvement of the disease or symptom(s). Therapeutic benefits are those associated with the successful treatment of the disease. Finally, palliative benefits are those related to the decrease of the primary symptoms, without necessarily curing the condition (example: decreasing the pain from a flare-up of a chronic condition, such as incurable terminal cancer).  General Risks and Complications: These are associated to most interventional treatments. They can occur alone, or in combination. They fall under one of the following six (6) categories: no benefit or worsening of symptoms; bleeding; infection; nerve damage; allergic reactions; and/or death. No benefits or worsening of symptoms: In Medicine there are no guarantees, only probabilities. No healthcare provider can ever guarantee that a medical treatment will work, they can only state the probability that it may. Furthermore, there is always the possibility that the condition may worsen, either  directly, or indirectly, as a consequence of the treatment. Bleeding: This is more common if the patient is taking a blood thinner, either prescription or over the counter (example: Goody Powders, Fish oil, Aspirin, Garlic, etc.), or if suffering a condition associated with impaired coagulation (example: Hemophilia, cirrhosis of the liver, low platelet counts, etc.). However, even if you do not have one on these, it can still happen. If you have any of these conditions, or take one of these drugs, make sure to notify your treating physician. Infection: This is more common in patients with a compromised immune system, either due to disease (example: diabetes, cancer, human immunodeficiency virus [HIV], etc.), or due to medications or treatments (example: therapies used to treat cancer and rheumatological diseases). However, even if you do not have one on these, it can still happen. If you have any of these conditions, or take one of these drugs, make sure to notify your treating physician. Nerve Damage: This is more common when the treatment is an invasive one, but it can also happen with the use of medications, such as those used in the treatment of cancer. The damage can occur to small secondary nerves, or to large primary ones, such as those in the spinal cord and brain. This damage  may be temporary or permanent and it may lead to impairments that can range from temporary numbness to permanent paralysis and/or brain death. Allergic Reactions: Any time a substance or material comes in contact with our body, there is the possibility of an allergic reaction. These can range from a mild skin rash (contact dermatitis) to a severe systemic reaction (anaphylactic reaction), which can result in death. Death: In general, any medical intervention can result in death, most of the time due to an unforeseen complication. ______________________________________________________________________       ______________________________________________________________________    New Patients  Welcome to [facility].   Initial Visit The first or initial visit consists of an evaluation only.   Interventional pain management.  We offer therapies other than opioid controlled substances to manage chronic pain. These include, but are not limited to, diagnostic, therapeutic, and palliative specialized injection therapies (i.e.: Epidural Steroids, Facet Blocks, etc.). We specialize in a variety of nerve blocks as well as radiofrequency treatments. We offer pain implant evaluations and trials, as well as follow up management. In addition we also provide a variety joint injections, including Viscosupplementation (AKA: Gel Therapy).  Prescription Pain Medication. We specialize in alternatives to opioids. We can provide evaluations and recommendations for/of pharmacologic therapies based on CDC Guidelines.  We no longer take patients for long-term medication management. We will not be taking over your pain medications.  ______________________________________________________________________      ______________________________________________________________________    Patient Information update  To: All of our patients.  Re: Name change.  It has been made official that our current name, "[facility]"   will soon be changed to "[facility]".   The purpose of this change is to eliminate any confusion created by the concept of our practice being a "Medication Management Pain Clinic". In the past this has led to the misconception that we treat pain primarily by the use of prescription medications.  Nothing can be farther from the truth.   Understanding PAIN MANAGEMENT: To further understand what our practice does, you first have to  understand that "Pain Management" is a subspecialty that requires additional training once a physician has completed their specialty training, which can be in either Anesthesia, Neurology, Psychiatry, or Physical Medicine and Rehabilitation (PMR). Each one of these contributes to the final approach taken by each physician to the management of their patient's pain. To be a "Pain Management Specialist" you must have first completed one of the specialty trainings below.  Anesthesiologists - trained in clinical pharmacology and interventional techniques such as nerve blockade and regional as well as central neuroanatomy. They are trained to block pain before, during, and after surgical interventions.  Neurologists - trained in the diagnosis and pharmacological treatment of complex neurological conditions, such as Multiple Sclerosis, Parkinson's, spinal cord injuries, and other systemic conditions that may be associated with symptoms that may include but are not limited to pain. They tend to rely primarily on the treatment of chronic pain using prescription medications.  Psychiatrist - trained in conditions affecting the psychosocial wellbeing of patients including but not limited to depression, anxiety, schizophrenia, personality disorders, addiction, and other substance use disorders that may be associated with chronic pain. They tend to rely primarily on the treatment of chronic pain using prescription medications.   Physical Medicine and Rehabilitation (PMR) physicians, also known as physiatrists - trained to treat a wide variety of medical  conditions affecting the brain, spinal cord, nerves, bones, joints, ligaments, muscles, and tendons. Their training is primarily aimed at treating patients that have suffered injuries that have caused severe physical impairment. Their training is primarily aimed at the physical therapy and rehabilitation of those patients. They may also work alongside orthopedic surgeons  or neurosurgeons using their expertise in assisting surgical patients to recover after their surgeries.  INTERVENTIONAL PAIN MANAGEMENT is sub-subspecialty of Pain Management.  Our physicians are Board-certified in Anesthesia, Pain Management, and Interventional Pain Management.  This meaning that not only have they been trained and Board-certified in their specialty of Anesthesia, and subspecialty of Pain Management, but they have also received further training in the sub-subspecialty of Interventional Pain Management, in order to become Board-certified as INTERVENTIONAL PAIN MANAGEMENT SPECIALIST.    Mission: Our goal is to use our skills in  INTERVENTIONAL PAIN MANAGEMENT as alternatives to the chronic use of prescription opioid medications for the treatment of pain. To make this more clear, we have changed our name to reflect what we do and offer. We will continue to offer medication management assessment and recommendations, but we will not be taking over any patient's medication management.  ______________________________________________________________________

## 2023-03-10 ENCOUNTER — Ambulatory Visit: Payer: Medicare HMO

## 2023-03-12 ENCOUNTER — Ambulatory Visit: Payer: Medicare HMO

## 2023-03-17 ENCOUNTER — Ambulatory Visit: Payer: Medicare HMO

## 2023-03-19 ENCOUNTER — Ambulatory Visit: Payer: Medicare HMO | Attending: Pain Medicine | Admitting: Pain Medicine

## 2023-03-19 ENCOUNTER — Encounter: Payer: Self-pay | Admitting: Pain Medicine

## 2023-03-19 ENCOUNTER — Ambulatory Visit
Admission: RE | Admit: 2023-03-19 | Discharge: 2023-03-19 | Disposition: A | Discharge status: Home / Self Care | Payer: Medicare HMO | Source: Ambulatory Visit | Attending: Pain Medicine | Admitting: Pain Medicine

## 2023-03-19 VITALS — BP 168/79 | HR 78 | Temp 97.2°F | Resp 16 | Ht 62.0 in | Wt 130.0 lb

## 2023-03-19 DIAGNOSIS — M5459 Other low back pain: Secondary | ICD-10-CM | POA: Insufficient documentation

## 2023-03-19 DIAGNOSIS — G8929 Other chronic pain: Secondary | ICD-10-CM | POA: Diagnosis present

## 2023-03-19 DIAGNOSIS — M545 Low back pain, unspecified: Secondary | ICD-10-CM | POA: Diagnosis not present

## 2023-03-19 DIAGNOSIS — M4310 Spondylolisthesis, site unspecified: Secondary | ICD-10-CM | POA: Diagnosis present

## 2023-03-19 DIAGNOSIS — M961 Postlaminectomy syndrome, not elsewhere classified: Secondary | ICD-10-CM | POA: Insufficient documentation

## 2023-03-19 DIAGNOSIS — Z91040 Latex allergy status: Secondary | ICD-10-CM | POA: Insufficient documentation

## 2023-03-19 DIAGNOSIS — M47816 Spondylosis without myelopathy or radiculopathy, lumbar region: Secondary | ICD-10-CM | POA: Diagnosis present

## 2023-03-19 DIAGNOSIS — M47817 Spondylosis without myelopathy or radiculopathy, lumbosacral region: Secondary | ICD-10-CM | POA: Insufficient documentation

## 2023-03-19 DIAGNOSIS — Z9189 Other specified personal risk factors, not elsewhere classified: Secondary | ICD-10-CM | POA: Diagnosis present

## 2023-03-19 DIAGNOSIS — T85848S Pain due to other internal prosthetic devices, implants and grafts, sequela: Secondary | ICD-10-CM | POA: Insufficient documentation

## 2023-03-19 DIAGNOSIS — M51379 Other intervertebral disc degeneration, lumbosacral region without mention of lumbar back pain or lower extremity pain: Secondary | ICD-10-CM | POA: Diagnosis present

## 2023-03-19 DIAGNOSIS — S32020S Wedge compression fracture of second lumbar vertebra, sequela: Secondary | ICD-10-CM | POA: Diagnosis present

## 2023-03-19 MED ORDER — TRIAMCINOLONE ACETONIDE 40 MG/ML IJ SUSP
80.0000 mg | Freq: Once | INTRAMUSCULAR | Status: AC
  Administered 2023-03-19: 80 mg
  Filled 2023-03-19: qty 2

## 2023-03-19 MED ORDER — ROPIVACAINE HCL 2 MG/ML IJ SOLN
18.0000 mL | Freq: Once | INTRAMUSCULAR | Status: AC
  Administered 2023-03-19: 18 mL via PERINEURAL
  Filled 2023-03-19: qty 20

## 2023-03-19 MED ORDER — LACTATED RINGERS IV SOLN: Freq: Once | INTRAVENOUS | Status: DC

## 2023-03-19 MED ORDER — LIDOCAINE HCL 2 % IJ SOLN
20.0000 mL | Freq: Once | INTRAMUSCULAR | Status: AC
  Administered 2023-03-19: 400 mg
  Filled 2023-03-19: qty 20

## 2023-03-19 MED ORDER — PENTAFLUOROPROP-TETRAFLUOROETH EX AERO
INHALATION_SPRAY | Freq: Once | CUTANEOUS | Status: AC
Start: 2023-03-19 — End: 2023-03-19
  Administered 2023-03-19: 30 via TOPICAL
  Filled 2023-03-19: qty 30

## 2023-03-19 MED ORDER — MIDAZOLAM HCL 2 MG/2ML IJ SOLN: 0.5000 mg | Freq: Once | INTRAMUSCULAR | Status: DC

## 2023-03-19 NOTE — Patient Instructions (Signed)

## 2023-03-19 NOTE — Progress Notes (Signed)
PROVIDER NOTE: Interpretation of information contained herein should be left to medically-trained personnel. Specific patient instructions are provided elsewhere under "Patient Instructions" section of medical record. This document was created in part using STT-dictation technology, any transcriptional errors that may result from this process are unintentional.  Patient: Kendra Benton Type: Established DOB: 02/12/1941 MRN: [id number] PCP: Cain Sieve, MD  Service: Procedure DOS: 03/19/2023 Setting: Ambulatory Location: Ambulatory outpatient facility Delivery: Face-to-face Provider: Oswaldo Done, MD Specialty: Interventional Pain Management Specialty designation: 09 Location: Outpatient facility Ref. Prov.: Delano Metz, MD       Interventional Therapy   Procedure: Lumbar Facet, Medial Branch Block(s) #2  Laterality: Bilateral  Level: L2, L3, L4, L5, and S1 Medial Branch Level(s). Injecting these levels blocks the L3-4, L4-5, and L5-S1 lumbar facet joints. Imaging: Fluoroscopic guidance         Anesthesia: Local anesthesia (1-2% Lidocaine) Anxiolysis: None                 Sedation: Moderate Sedation                       DOS: 03/19/2023 Performed by: Oswaldo Done, MD  Primary Purpose: Diagnostic/Therapeutic Indications: Low back pain severe enough to impact quality of life or function. 1. Chronic low back pain (1ry area of Pain) (Bilateral) (L>R) w/o sciatica   2. Spondylosis without myelopathy or radiculopathy, lumbosacral region   3. Pain at implanted Lumbar hardware   4. Lumbar facet joint pain   5. Lumbar facet joint syndrome   6. Degeneration of intervertebral disc of lumbosacral region, unspecified whether pain present   7. Failed back surgical syndrome   8. Grade 1 Retrolisthesis of L2/L3   9. Latex precautions, history of latex allergy   10. At high risk for allergic reaction to latex   11. History of allergy to latex    NAS-11 Pain  score:   Pre-procedure: 5 /10   Post-procedure: 0-No pain/10     Position / Prep / Materials:  Position: Prone  Prep solution: DuraPrep (Iodine Povacrylex [0.7% available iodine] and Isopropyl Alcohol, 74% w/w) Area Prepped: Posterolateral Lumbosacral Spine (Wide prep: From the lower border of the scapula down to the end of the tailbone and from flank to flank.)  Materials:  Tray: Block Needle(s):  Type: Spinal  Gauge (G): 22  Length: 3.5-in Qty: 4      H&P (Pre-op Assessment):  Kendra Benton is a 82 y.o. (year old), female patient, seen today for interventional treatment. She  has a past surgical history that includes Cholecystectomy (N/A, 02/28/2016); Spine surgery; Abdominal hysterectomy; Appendectomy; Lumbar fusion (11/09/2016); Cardiac catheterization; Back surgery; Tonsillectomy; Cataract extraction w/ intraocular lens  implant, bilateral (Bilateral, 2010); Artery Biopsy (Right, 12/30/2017); and Artery Biopsy (Left, 06/18/2018). Kendra Benton has a current medication list which includes the following prescription(s): acetaminophen, albuterol, atorvastatin, calcium-vitamin d, dicyclomine, duloxetine, duloxetine, estradiol, furosemide, gabapentin, carboxymethylcellulose sodium, loperamide, losartan, naproxen sodium, omeprazole, oxcarbazepine, fibercon, potassium chloride sa, temazepam, turmeric, lisinopril, and tramadol. Her primarily concern today is the Back Pain  Initial Vital Signs:  Pulse/HCG Rate: 78ECG Heart Rate: 75 Temp: (!) 97.2 F (36.2 C) Resp: 16 BP: (!) 144/69 SpO2: 100 %  BMI: Estimated body mass index is 23.78 kg/m as calculated from the following:   Height as of this encounter: 5\' 2"  (1.575 m).   Weight as of this encounter: 130 lb (59 kg).  Risk Assessment: Allergies: Reviewed. She is allergic to spironolactone, hydrochlorothiazide, ciprofloxacin, hydralazine,  levofloxacin, amlodipine, and latex.  Allergy Precautions: None required Coagulopathies: Reviewed. None  identified.  Blood-thinner therapy: None at this time Active Infection(s): Reviewed. None identified. Kendra Benton is afebrile  Site Confirmation: Kendra Benton was asked to confirm the procedure and laterality before marking the site Procedure checklist: Completed Consent: Before the procedure and under the influence of no sedative(s), amnesic(s), or anxiolytics, the patient was informed of the treatment options, risks and possible complications. To fulfill our ethical and legal obligations, as recommended by the American Medical Association's Code of Ethics, I have informed the patient of my clinical impression; the nature and purpose of the treatment or procedure; the risks, benefits, and possible complications of the intervention; the alternatives, including doing nothing; the risk(s) and benefit(s) of the alternative treatment(s) or procedure(s); and the risk(s) and benefit(s) of doing nothing. The patient was provided information about the general risks and possible complications associated with the procedure. These may include, but are not limited to: failure to achieve desired goals, infection, bleeding, organ or nerve damage, allergic reactions, paralysis, and death. In addition, the patient was informed of those risks and complications associated to Spine-related procedures, such as failure to decrease pain; infection (i.e.: Meningitis, epidural or intraspinal abscess); bleeding (i.e.: epidural hematoma, subarachnoid hemorrhage, or any other type of intraspinal or peri-dural bleeding); organ or nerve damage (i.e.: Any type of peripheral nerve, nerve root, or spinal cord injury) with subsequent damage to sensory, motor, and/or autonomic systems, resulting in permanent pain, numbness, and/or weakness of one or several areas of the body; allergic reactions; (i.e.: anaphylactic reaction); and/or death. Furthermore, the patient was informed of those risks and complications associated with the medications.  These include, but are not limited to: allergic reactions (i.e.: anaphylactic or anaphylactoid reaction(s)); adrenal axis suppression; blood sugar elevation that in diabetics may result in ketoacidosis or comma; water retention that in patients with history of congestive heart failure may result in shortness of breath, pulmonary edema, and decompensation with resultant heart failure; weight gain; swelling or edema; medication-induced neural toxicity; particulate matter embolism and blood vessel occlusion with resultant organ, and/or nervous system infarction; and/or aseptic necrosis of one or more joints. Finally, the patient was informed that Medicine is not an exact science; therefore, there is also the possibility of unforeseen or unpredictable risks and/or possible complications that may result in a catastrophic outcome. The patient indicated having understood very clearly. We have given the patient no guarantees and we have made no promises. Enough time was given to the patient to ask questions, all of which were answered to the patient's satisfaction. Ms. Burhans has indicated that she wanted to continue with the procedure. Attestation: I, the ordering provider, attest that I have discussed with the patient the benefits, risks, side-effects, alternatives, likelihood of achieving goals, and potential problems during recovery for the procedure that I have provided informed consent. Date  Time: 03/19/2023 10:14 AM   Pre-Procedure Preparation:  Monitoring: As per clinic protocol. Respiration, ETCO2, SpO2, BP, heart rate and rhythm monitor placed and checked for adequate function Safety Precautions: Patient was assessed for positional comfort and pressure points before starting the procedure. Time-out: I initiated and conducted the "Time-out" before starting the procedure, as per protocol. The patient was asked to participate by confirming the accuracy of the "Time Out" information. Verification of the  correct person, site, and procedure were performed and confirmed by me, the nursing staff, and the patient. "Time-out" conducted as per Joint Commission's Universal Protocol (UP.01.01.01). Time: 1126 Start  Time: 1126 hrs.  Description of Procedure:          Laterality: (see above) Targeted Levels: (see above)  Safety Precautions: Aspiration looking for blood return was conducted prior to all injections. At no point did we inject any substances, as a needle was being advanced. Before injecting, the patient was told to immediately notify me if she was experiencing any new onset of "ringing in the ears, or metallic taste in the mouth". No attempts were made at seeking any paresthesias. Safe injection practices and needle disposal techniques used. Medications properly checked for expiration dates. SDV (single dose vial) medications used. After the completion of the procedure, all disposable equipment used was discarded in the proper designated medical waste containers. Local Anesthesia: Protocol guidelines were followed. The patient was positioned over the fluoroscopy table. The area was prepped in the usual manner. The time-out was completed. The target area was identified using fluoroscopy. A 12-in long, straight, sterile hemostat was used with fluoroscopic guidance to locate the targets for each level blocked. Once located, the skin was marked with an approved surgical skin marker. Once all sites were marked, the skin (epidermis, dermis, and hypodermis), as well as deeper tissues (fat, connective tissue and muscle) were infiltrated with a small amount of a short-acting local anesthetic, loaded on a 10cc syringe with a 25G, 1.5-in  Needle. An appropriate amount of time was allowed for local anesthetics to take effect before proceeding to the next step. Local Anesthetic: Lidocaine 2.0% The unused portion of the local anesthetic was discarded in the proper designated containers. Technical description of  process:  L2 Medial Branch Nerve Block (MBB): The target area for the L2 medial branch is at the junction of the postero-lateral aspect of the superior articular process and the superior, posterior, and medial edge of the transverse process of L3. Under fluoroscopic guidance, a Quincke needle was inserted until contact was made with os over the superior postero-lateral aspect of the pedicular shadow (target area). After negative aspiration for blood, 0.5 mL of the nerve block solution was injected without difficulty or complication. The needle was removed intact. L3 Medial Branch Nerve Block (MBB): The target area for the L3 medial branch is at the junction of the postero-lateral aspect of the superior articular process and the superior, posterior, and medial edge of the transverse process of L4. Under fluoroscopic guidance, a Quincke needle was inserted until contact was made with os over the superior postero-lateral aspect of the pedicular shadow (target area). After negative aspiration for blood, 0.5 mL of the nerve block solution was injected without difficulty or complication. The needle was removed intact. L4 Medial Branch Nerve Block (MBB): The target area for the L4 medial branch is at the junction of the postero-lateral aspect of the superior articular process and the superior, posterior, and medial edge of the transverse process of L5. Under fluoroscopic guidance, a Quincke needle was inserted until contact was made with os over the superior postero-lateral aspect of the pedicular shadow (target area). After negative aspiration for blood, 0.5 mL of the nerve block solution was injected without difficulty or complication. The needle was removed intact. L5 Medial Branch Nerve Block (MBB): The target area for the L5 medial branch is at the junction of the postero-lateral aspect of the superior articular process and the superior, posterior, and medial edge of the sacral ala. Under fluoroscopic guidance, a  Quincke needle was inserted until contact was made with os over the superior postero-lateral aspect of the  pedicular shadow (target area). After negative aspiration for blood, 0.5 mL of the nerve block solution was injected without difficulty or complication. The needle was removed intact. S1 Medial Branch Nerve Block (MBB): The target area for the S1 medial branch is at the posterior and inferior 6 o'clock position of the L5-S1 facet joint. Under fluoroscopic guidance, the Quincke needle inserted for the L5 MBB was redirected until contact was made with os over the inferior and postero aspect of the sacrum, at the 6 o' clock position under the L5-S1 facet joint (Target area). After negative aspiration for blood, 0.5 mL of the nerve block solution was injected without difficulty or complication. The needle was removed intact.  Once the entire procedure was completed, the treated area was cleaned, making sure to leave some of the prepping solution back to take advantage of its long term bactericidal properties.         Illustration of the posterior view of the lumbar spine and the posterior neural structures. Laminae of L2 through S1 are labeled. DPRL5, dorsal primary ramus of L5; DPRS1, dorsal primary ramus of S1; DPR3, dorsal primary ramus of L3; FJ, facet (zygapophyseal) joint L3-L4; I, inferior articular process of L4; LB1, lateral branch of dorsal primary ramus of L1; IAB, inferior articular branches from L3 medial branch (supplies L4-L5 facet joint); IBP, intermediate branch plexus; MB3, medial branch of dorsal primary ramus of L3; NR3, third lumbar nerve root; S, superior articular process of L5; SAB, superior articular branches from L4 (supplies L4-5 facet joint also); TP3, transverse process of L3.   Facet Joint Innervation (* possible contribution)  L1-2 T12, L1 (L2*)  Medial Branch  L2-3 L1, L2 (L3*)         "          "  L3-4 L2, L3 (L4*)         "          "  L4-5 L3, L4 (L5*)         "           "  L5-S1 L4, L5, S1          "          "    Vitals:   03/19/23 1125 03/19/23 1130 03/19/23 1135 03/19/23 1136  BP: (!) 190/81 (!) 147/78 (!) 151/72 (!) 168/79  Pulse:      Resp: 13 14 10 16   Temp:      SpO2: 98% 98% 99% 96%  Weight:      Height:         End Time: 1136 hrs.  Imaging Guidance (Spinal):          Type of Imaging Technique: Fluoroscopy Guidance (Spinal) Indication(s): Assistance in needle guidance and placement for procedures requiring needle placement in or near specific anatomical locations not easily accessible without such assistance. Exposure Time: Please see nurses notes. Contrast: None used. Fluoroscopic Guidance: I was personally present during the use of fluoroscopy. "Tunnel Vision Technique" used to obtain the best possible view of the target area. Parallax error corrected before commencing the procedure. "Direction-depth-direction" technique used to introduce the needle under continuous pulsed fluoroscopy. Once target was reached, antero-posterior, oblique, and lateral fluoroscopic projection used confirm needle placement in all planes. Images permanently stored in EMR. Interpretation: No contrast injected. I personally interpreted the imaging intraoperatively. Adequate needle placement confirmed in multiple planes. Permanent images saved into the patient's record.  Post-operative Assessment:  Post-procedure Vital Signs:  Pulse/HCG Rate: 7883 Temp: (!) 97.2 F (36.2 C) Resp: 16 BP: (!) 168/79 SpO2: 96 %  EBL: None  Complications: No immediate post-treatment complications observed by team, or reported by patient.  Note: The patient tolerated the entire procedure well. A repeat set of vitals were taken after the procedure and the patient was kept under observation following institutional policy, for this type of procedure. Post-procedural neurological assessment was performed, showing return to baseline, prior to discharge. The patient was provided  with post-procedure discharge instructions, including a section on how to identify potential problems. Should any problems arise concerning this procedure, the patient was given instructions to immediately contact us, at any time, without hesitation. In any case, we plan to contact the patient by telephone for a follow-up status report regarding this interventional procedure.  Comments:  No additional relevant information.  Plan of Care (POC)  Orders:  Orders Placed This Encounter  Procedures   LUMBAR FACET(MEDIAL BRANCH NERVE BLOCK) MBNB    Scheduling Instructions:     Procedure: Lumbar facet block (AKA.: Lumbosacral medial branch nerve block)     Side: Bilateral     Level: L3-4, L4-5, L5-S1, and TBD Facets (L2, L3, L4, L5, S1, and TBD Medial Branch Nerves)     Sedation: Patient's choice.     Timeframe: Today    Order Specific Question:   Where will this procedure be performed?    Answer:   [facility]   DG [facility] C-ARM 1-60 MIN NO REPORT    Intraoperative interpretation by procedural physician at [facility].    Standing Status:   Standing    Number of Occurrences:   1    Order Specific Question:   Reason for exam:    Answer:   Assistance in needle guidance and placement for procedures requiring needle placement in or near specific anatomical locations not easily accessible without such assistance.   Informed Consent Details: Physician/Practitioner Attestation; Transcribe to consent form and obtain patient signature    Nursing Order: Transcribe to consent form and obtain patient signature. Note: Always confirm laterality of pain with Ms. Somoza, before procedure.    Order Specific Question:   Physician/Practitioner attestation of informed consent for procedure/surgical case    Answer:   I, the physician/practitioner, attest that I have discussed with the patient the benefits, risks, side effects, alternatives, likelihood of achieving goals and potential problems  during recovery for the procedure that I have provided informed consent.    Order Specific Question:   Procedure    Answer:   Lumbar Facet Block  under fluoroscopic guidance    Order Specific Question:   Physician/Practitioner performing the procedure    Answer:   Georgene Kopper A. Laban Emperor MD    Order Specific Question:   Indication/Reason    Answer:   Low Back Pain, with our without leg pain, due to Facet Joint Arthralgia (Joint Pain) Spondylosis (Arthritis of the Spine), without myelopathy or radiculopathy (Nerve Damage).   Provide equipment / supplies at bedside    Procedure tray: "Block Tray" (Disposable  single use) Skin infiltration needle: Regular 1.5-in, 25-G, (x1) Block Needle type: Spinal Amount/quantity: 4 Size: Medium (5-inch) Gauge: 22G    Standing Status:   Standing    Number of Occurrences:   1    Order Specific Question:   Specify    Answer:   Block Tray   Latex precautions    Activate Latex-Free Protocol.    Standing Status:   Standing  Number of Occurrences:   1   Chronic Opioid Analgesic:  None MME/day: 0 mg/day   Medications ordered for procedure: Meds ordered this encounter  Medications   lidocaine (XYLOCAINE) 2 % (with pres) injection 400 mg   pentafluoroprop-tetrafluoroeth (GEBAUERS) aerosol   DISCONTD: lactated ringers infusion   DISCONTD: midazolam (VERSED) injection 0.5-2 mg    Make sure Flumazenil is available in the pyxis when using this medication. If oversedation occurs, administer 0.2 mg IV over 15 sec. If after 45 sec no response, administer 0.2 mg again over 1 min; may repeat at 1 min intervals; not to exceed 4 doses (1 mg)   ropivacaine (PF) 2 mg/mL (0.2%) (NAROPIN) injection 18 mL   triamcinolone acetonide (KENALOG-40) injection 80 mg   Medications administered: We administered lidocaine, pentafluoroprop-tetrafluoroeth, ropivacaine (PF) 2 mg/mL (0.2%), and triamcinolone acetonide.  See the medical record for exact dosing, route, and time of  administration.  Follow-up plan:   Return in about 2 weeks (around 04/02/2023) for (Face2F), (PPE).       Interventional Therapies  Risk Factors  Considerations  Medical Comorbidities:  Latex Allergy  Advanced age           Planned  Pending:   Therapeutic bilateral lumbar facet (L3-4, L4-5, and L5-S1) MBB (L2-S1) #2    Under consideration:   Pending   Completed:   Diagnostic bilateral lumbar facet (L3-4, L4-5, and L5-S1) MBB (L2-S1) x1 (06/19/2020) (100/100/80/80)    Therapeutic  Palliative (PRN) options:   None established   Completed by other providers:   Therapeutic bilateral SI joint inj. x2 (08/24/2019, 12/16/2019) by Windell Norfolk, MD [facility] PMR)  Therapeutic right lumbar L4/L5 and L5/S1 peri-articular/peri-hardware inj. x1 (03/11/2019) by Windell Norfolk, MD [facility] PMR)  Therapeutic left IA hip inj. x1 (05/14/2018) by Windell Norfolk, MD [facility] PMR)  Therapeutic right lumbar facet (L4, L5, S1) MBB x1 (04/02/2018) by Windell Norfolk, MD [facility]R)  Therapeutic right lumbar facet (L5, S1) MBB x1 (02/19/2018) by Windell Norfolk, MD [facility] PMR)  Therapeutic right SI joint inj. x1 (12/11/2017) by Windell Norfolk, MD [facility] PMR)  Therapeutic right hip bursa inj. x1 (12/11/2017) by Windell Norfolk, MD [facility] PMR)         Recent Visits Date Type Provider Dept  03/04/23 Office Visit Delano Metz, MD [facility]  Showing recent visits within past 90 days and meeting all other requirements Today's Visits Date Type Provider Dept  03/19/23 Procedure visit Delano Metz, MD [facility]  Showing today's visits and meeting all other requirements Future Appointments Date Type Provider Dept  04/02/23 Appointment Delano Metz, MD [facility]  Showing future appointments within next 90 days and meeting all other requirements  Disposition: Discharge home  Discharge (Date  Time): 03/19/2023; 1142 hrs.   Primary Care Physician: Cain Sieve,  MD Location: [facility] Note by: Oswaldo Done, MD (TTS technology used. I apologize for any typographical errors that were not detected and corrected.) Date: 03/19/2023; Time: 12:04 PM  Disclaimer:  Medicine is not an Visual merchandiser. The only guarantee in medicine is that nothing is guaranteed. It is important to note that the decision to proceed with this intervention was based on the information collected from the patient. The Data and conclusions were drawn from the patient's questionnaire, the interview, and the physical examination. Because the information was provided in large part by the patient, it cannot be guaranteed that it has not been purposely or unconsciously manipulated. Every effort has  been made to obtain as much relevant data as possible for this evaluation. It is important to note that the conclusions that lead to this procedure are derived in large part from the available data. Always take into account that the treatment will also be dependent on availability of resources and existing treatment guidelines, considered by other Pain Management Practitioners as being common knowledge and practice, at the time of the intervention. For Medico-Legal purposes, it is also important to point out that variation in procedural techniques and pharmacological choices are the acceptable norm. The indications, contraindications, technique, and results of the above procedure should only be interpreted and judged by a Board-Certified Interventional Pain Specialist with extensive familiarity and expertise in the same exact procedure and technique.

## 2023-03-19 NOTE — Progress Notes (Signed)
Safety precautions to be maintained throughout the outpatient stay will include: orient to surroundings, keep bed in low position, maintain call bell within reach at all times, provide assistance with transfer out of bed and ambulation.  

## 2023-03-20 ENCOUNTER — Ambulatory Visit: Payer: Medicare HMO

## 2023-03-20 ENCOUNTER — Telehealth: Payer: Self-pay | Admitting: *Deleted

## 2023-03-20 NOTE — Telephone Encounter (Signed)
Post procedure call;  patient reports that she is doing well.  Pain is improved.  Will see at f/up appt.

## 2023-03-24 ENCOUNTER — Ambulatory Visit: Payer: Medicare HMO | Attending: Internal Medicine

## 2023-03-24 DIAGNOSIS — M545 Low back pain, unspecified: Secondary | ICD-10-CM | POA: Insufficient documentation

## 2023-03-24 DIAGNOSIS — R278 Other lack of coordination: Secondary | ICD-10-CM | POA: Diagnosis present

## 2023-03-24 DIAGNOSIS — R2689 Other abnormalities of gait and mobility: Secondary | ICD-10-CM | POA: Insufficient documentation

## 2023-03-24 DIAGNOSIS — M6281 Muscle weakness (generalized): Secondary | ICD-10-CM | POA: Diagnosis present

## 2023-03-24 DIAGNOSIS — R262 Difficulty in walking, not elsewhere classified: Secondary | ICD-10-CM | POA: Diagnosis present

## 2023-03-24 DIAGNOSIS — G8929 Other chronic pain: Secondary | ICD-10-CM | POA: Insufficient documentation

## 2023-03-24 DIAGNOSIS — R269 Unspecified abnormalities of gait and mobility: Secondary | ICD-10-CM | POA: Diagnosis present

## 2023-03-24 DIAGNOSIS — R2681 Unsteadiness on feet: Secondary | ICD-10-CM | POA: Diagnosis present

## 2023-03-24 NOTE — Therapy (Signed)
OUTPATIENT PHYSICAL THERAPY TREATMENT/RECERT/Physical Therapy Progress Note   Dates of reporting period  01/12/2023   to   03/24/2023    Patient Name: Kendra Benton MRN: [id number] DOB:Nov 18, 1940, 82 y.o., female Today's Date: 03/24/2023  END OF SESSION:  PT End of Session - 03/24/23 1146     Visit Number 30    Number of Visits 46   date corrected to reflect most recent recert   Date for PT Re-Evaluation 05/19/23    Authorization Type [employer] primary; [place] medicaid secondary    Authorization Time Period 09/11/22-12/04/22    Progress Note Due on Visit 30    PT Start Time 1147    PT Stop Time 1226    PT Time Calculation (min) 39 min    Equipment Utilized During Treatment Gait belt    Activity Tolerance Patient tolerated treatment well;No increased pain    Behavior During Therapy WFL for tasks assessed/performed                        Past Medical History:  Diagnosis Date   Allergy    Anxiety    GERD (gastroesophageal reflux disease)    Headache    History of kidney stones    Hypertension    Myocardial infarction (HCC)    1997   Osteoporosis    Pneumonia    Past Surgical History:  Procedure Laterality Date   ABDOMINAL HYSTERECTOMY     patient has ovaries   APPENDECTOMY     ARTERY BIOPSY Right 12/30/2017   Procedure: BIOPSY TEMPORAL ARTERY;  Surgeon: Renford Dills, MD;  Location: [facility];  Service: Vascular;  Laterality: Right;   ARTERY BIOPSY Left 06/18/2018   Procedure: BIOPSY TEMPORAL ARTERY;  Surgeon: Renford Dills, MD;  Location: [facility];  Service: Vascular;  Laterality: Left;   BACK SURGERY     CARDIAC CATHETERIZATION     CATARACT EXTRACTION W/ INTRAOCULAR LENS  IMPLANT, BILATERAL Bilateral 2010   CHOLECYSTECTOMY N/A 02/28/2016   Procedure: LAPAROSCOPIC CHOLECYSTECTOMY WITH INTRAOPERATIVE CHOLANGIOGRAM;  Surgeon: Tiney Rouge III, MD;  Location: [facility];  Service: General;  Laterality: N/A;   LUMBAR FUSION  11/09/2016   L3-L5   SPINE  SURGERY     Disc   TONSILLECTOMY     Patient Active Problem List   Diagnosis Date Noted   Grade 1 Retrolisthesis of L2/L3 03/19/2023   Latex precautions, history of latex allergy 03/19/2023   At high risk for allergic reaction to latex 03/19/2023   History of allergy to latex 03/19/2023   Lumbar facet joint pain 03/04/2023   Compression fracture of L2 lumbar vertebra, sequela 03/04/2023   Pain at implanted Lumbar hardware 03/04/2023   Fall (on) (from) other stairs and steps, initial encounter 08/06/2020   Lumbar facet joint syndrome 06/18/2020   Spondylosis without myelopathy or radiculopathy, lumbosacral region 06/18/2020   DDD (degenerative disc disease), lumbosacral 06/18/2020   COVID 06/04/2020   Chronic pain syndrome 05/16/2020   Pharmacologic therapy 05/16/2020   Disorder of skeletal system 05/16/2020   Problems influencing health status 05/16/2020   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   Chronic hip pain (2ry area of Pain) (Bilateral) (R>L) 05/16/2020   Chronic lower extremity pain (Left) 05/16/2020   Numbness and tingling of both feet (intermittent/recurrent) 05/16/2020   Pain in rib (Bilateral) (R>L) (intermittent) 05/16/2020   Chronic knee pain (Left) 05/16/2020   Patellar pain (Left) (chronic, intermittent) 05/16/2020   Hard of hearing 05/16/2020  Carcinoma, lung, 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/25/2019   S/P partial lobectomy of lung 02/25/2019   Mass of upper lobe of right lung 01/06/2019   Chronic nonintractable headache 06/14/2018   Chronic headache 12/03/2017   Sinus bradycardia 10/06/2017   Chronic low back pain (1ry area of Pain) (Bilateral) (L>R) w/o sciatica 08/17/2017   Panic attack as reaction to stress 08/17/2017   Anxiety 06/15/2017   Atypical chest pain 06/15/2017   Hyperlipidemia 06/15/2017   Cholecystitis with cholelithiasis 02/27/2016   Increased frequency of urination 01/27/2015   History of night sweats 05/01/2014   Dupuytren's contracture 07/18/2013    Inverted nipple 04/18/2013   Depression 04/23/2010   Fibromyalgia 04/23/2010   Hypercholesterolemia 04/23/2010   Essential hypertension 04/23/2010   Hypothyroidism 04/23/2010   Irritable colon 04/23/2010   Osteoarthritis 04/23/2010    PCP: Cain Sieve, MD  REFERRING PROVIDER: Cain Sieve, MD  REFERRING DIAG: Chronic low back pain; At risk for falls  Rationale for Evaluation and Treatment: Rehabilitation  THERAPY DIAG:  Muscle weakness (generalized)  Abnormality of gait and mobility  Difficulty in walking, not elsewhere classified  Other lack of coordination  Other abnormalities of gait and mobility  Unsteadiness on feet  Chronic bilateral low back pain without sciatica  ONSET DATE: chronic but worse since Jan 2024  SUBJECTIVE:                                                                                                                                                                                           SUBJECTIVE STATEMENT:  Patient reports only minimal spinal relief from injections performed last week. She states she had some really good days but pain returned over the weekend and not as bad but still present. Reports feeling weaker since missing PT   PERTINENT HISTORY:  Patient is a 82 year old female with diagnosis of chronic low back pain with scoliosis and degenerative lumbar spine. She has received PT in the past for condition with positive results and currently using pain management strategies including pain meds, Heat, Exercises, Yoga, lidocain patch, diclofenac topical gel, and biofreeze. She was HTN controlled with medication.  She reports still lives alone but having trouble with community and social outings and having to modify how she performs her ADLs and housework due to her chronic pain. PMH- Spondylosis, Scoliosis, HTN, Lung CA.  PAIN:  Are you having pain? Yes 6/10 across low back, midline  PRECAUTIONS: Fall  WEIGHT BEARING  RESTRICTIONS: No  FALLS:  Has patient fallen in last 6 months? No- However patient reports some balance issues and near falls- using cane with community outings  PLOF: Independent  PATIENT GOALS: To decrease my pain  NEXT MD VISIT: unknown   OBJECTIVE:   TODAY'S TREATMENT:                                                                                                                              DATE:  02/25/2023     Physical therapy treatment session today consisted of completing assessment of goals and administration of testing as demonstrated and documented in flow sheet, treatment, and goals section of this note. Addition treatments may be found below.    Pt performed 5 time sit<>stand (5xSTS): 14.38 sec (>15 sec indicates increased fall risk)    10MWT= 0.96 m/s normal and 1.2 m/s (fast)    CGA from PT for safety for all NMR and standing therex throughout session.     PATIENT EDUCATION:  Education details: PT plan of care; Exercise technique  Person educated: Patient Education method: Explanation, Demonstration, Tactile cues, and Verbal cues Education comprehension: verbalized understanding, returned demonstration, verbal cues required, and tactile cues required  HOME EXERCISE PROGRAM: Access Code: [email or web address] URL: [email or web address] Date: 02/18/2023 Prepared by: Maureen Ralphs  Exercises - Seated Sciatic Tensioner  - 1 x daily - 7 x weekly - 3 sets - 20 hold      11/14/22:  Side step c red band around knees, back against wall   Access Code: [email or web address] URL: [email or web address] Date: 10/13/2022 Prepared by: Maureen Ralphs  Exercises - Standing Quadratus Lumborum Stretch with Doorway  - 1 x daily - 7 x weekly - 3 sets - 20-30 sec hold - Standing Quadratus Lumborum Mobilization with Small Ball on Wall  - 1 x daily - 7 x weekly - 3 sets - 10 reps - Supine Quadratus Lumborum Stretch  - 1 x daily - 7 x weekly - 3 sets - 20-30  hold - 90/90 SI Joint Self-Correction with Dowel  - 1 x daily - 7 x weekly - 3 sets - 10 reps - Hooklying Isometric Hip Abduction with Belt  - 1 x daily - 7 x weekly - 3 sets - 10 reps - 5 hold    Access Code: [email or web address] URL: [email or web address] Date: 10/09/2022 Prepared by: Maureen Ralphs  Exercises - Standing Quadratus Lumborum Stretch with Doorway  - 1 x daily - 7 x weekly - 3 sets - 20-30 sec hold - Standing Quadratus Lumborum Mobilization with Small Ball on Wall  - 1 x daily - 7 x weekly - 3 sets - 10 reps - Supine Quadratus Lumborum Stretch  - 1 x daily - 7 x weekly - 3 sets - 20-30 hold  ASSESSMENT:  CLINICAL IMPRESSION: Patient presents today with good spirits - missing last couple of weeks but did meet with pain management and received recent facet injection with minimal pain relief. Overall- patient is doing well- still presents with some unsteadiness and remains at risk for falling- added new balance goal today. Also discussed long term plan to manage her back  pain and imbalance with gym based exericses and she will benefit from continued PT services to instruct her in safe exercises that can be performed in gym setting. Pt will continue to benefit from skilled physical therapy intervention to address impairments, improve QOL, and attain therapy goals.       OBJECTIVE IMPAIRMENTS: Abnormal gait, decreased activity tolerance, decreased balance, decreased endurance, decreased mobility, difficulty walking, decreased strength, and pain.   ACTIVITY LIMITATIONS: carrying, lifting, bending, sitting, standing, squatting, sleeping, stairs, transfers, and bed mobility  PARTICIPATION LIMITATIONS: cleaning, laundry, shopping, community activity, and yard work  PERSONAL FACTORS: Age, Time since onset of injury/illness/exacerbation, and 3+ comorbidities: scoliosis, HTN, degenerative spine  are also affecting patient's functional outcome.   REHAB POTENTIAL:  Good  CLINICAL DECISION MAKING: Stable/uncomplicated  EVALUATION COMPLEXITY: Moderate   GOALS: Goals reviewed with patient? Yes  SHORT TERM GOALS: Target date: 10/24/2022  Pt will decrease worst back pain as reported on NPRS by at least 2 points in order to demonstrate clinically significant reduction in back pain.  Baseline: Eval= 8/10; 11/17/2022= 4/10 current and up to 7-8/10 at worst.  Goal status: MET  LONG TERM GOALS: Target date: 02/27/2023  Pt will be independent with Final HEP in order to improve strength and decrease back pain in order to improve pain-free function at home and work.  Baseline: EVAL= Patient reports needs update and review of exercises to assist with her back pain; 11/17/2022- Patient performing mostly self stretching and yoga based exercises and will continue to benefit from adding to comprehensive HEP; 12/05/2022- HEP ongoing focusing more on Lumbar strengthening; 03/24/2023- patient has made shift to more balance/LE exercises but will benefit from further training and progression of HEP Goal status: ONGOING  2.  Pt will decrease worst back pain as reported on NPRS by at least 3 points in order to demonstrate clinically significant reduction in back pain.  Baseline: EVAL= 8/10; 11/17/2022= 4/10 current and up to 7-8/10 at worst. 12/05/2022= 4/10 current- and up to 6/10 depending on activity 01/12/23: 4/10 and is consistently at this level. 03/24/2023- general low back pain= 4/10.  Goal status: ONGOING  3.  Pt will decrease MODI score by at least 13 points in order demonstrate clinically significant reduction in back pain/disability.   Baseline: EVAL=58%; 11/17/2022= 40%;12/05/2022= 44% 01/12/23:38% Goal status: MET  4.  Pt will improve FOTO to target score of >55 to display perceived improvements in ability to complete ADL's.  Baseline: EVAL= 54; 11/14/22: 56; 12/05/2022=63 Goal status: MET  5.  Patient (> 56 years old) will complete five times sit to stand test in <15  seconds indicating an increased LE strength and improved balance.  Baseline: EVAL = 20.33; 11/17/2022= 16.55 sec without UE Support; 12/05/2022= 16.47 sec without UE support 01/12/23: 15.5 sec; 03/24/2023=14.38 sec without UE support Goal status: MET  6.  Pt will increase by at least 0.13 m/s in order to demonstrate clinically significant improvement in community ambulation.   Baseline: EVAL = 0.97 m/s; 6/10=0.98 m/s; 12/05/2022= 0.98 m/s 8/5:1.08 m/s; 03/24/2023= 0.96 m/s normal and 1.2 m/s (fast)  Goal status: MET  7.  Patient will report ability to complete > or equal to 60 min grocery store outing or similar community activiities to return to her previous level of function.  Baseline: EVAL: patient reports difficulty tolerating outings in community. 12/05/2022= Patient reports up to around 40 min of continuous community activities.  01/12/23: last time she went she was up for an hour and used cart  and had no issues, has some issues with heat but that has been goingn on for 20 years per pt .  Goal status: MET    8. Pt will improve BERG by at least 3 points in order to demonstrate clinically significant improvement in balance.    Baseline: 09/30/2022= 47/56; 12/05/2022= 53/56  Goal status: MET  9. Pt will increase by at least 46m (169ft) in order to demonstrate clinically significant improvement in cardiopulmonary endurance and community ambulation   Baseline: 09/23/2022= use of SPC RUE (pt preference): starting pain 4/10, then 5/10 at 341ft, and 6/10 at 533ft: audible RR dyspnea. Test terminated after 679ft, 3 minutes 15 seconds; 11/17/2022= 920 feet in 5 min 37 sec c/o 4/10 LBP and some DOE- O2 sat unable to read due to cold fingers- Respirations= 23/min; 12/05/2022= 1050 feet in 6 min  01/12/23: 1030 ft Spo2 95% post bout  Goal status: MET   7. Pt will improve FGA by at least 3 points in order to demonstrate clinically significant improvement in balance and decreased risk for  falls.  Baseline: 03/24/2023= 23  Goal status: NEW  8. Patient will transition from skilled PT to gym based program for long term management of strength training.   Baseline: Patient currently involved in skilled PT program for LBP/balance.  Goal status: New  9. Patient will present with improved dynamic standing balance as seen by SLS  consistently > 10 sec for improved dynamic mobility.   Baseline: 03/24/2023= 4-8 sec at bes  Goal status: NEW  PLAN:  PT FREQUENCY: 1-2x/week  PT DURATION: 12 weeks  PLANNED INTERVENTIONS: Therapeutic exercises, Therapeutic activity, Neuromuscular re-education, Balance training, Gait training, Patient/Family education, Self Care, Joint mobilization, Joint manipulation, Stair training, Vestibular training, Canalith repositioning, Orthotic/Fit training, DME instructions, Dry Needling, Electrical stimulation, Spinal manipulation, Spinal mobilization, Cryotherapy, Moist heat, scar mobilization, Taping, and Manual therapy.  PLAN FOR NEXT SESSION:      Functional LE strengthening and core strength, dynamic balance training.  [employer] gym based instruction next visit    Lenda Kelp, PT 03/24/2023, 3:49 PM   3:49 PM, 03/24/23 Physical Therapist - [facility]  Outpatient Physical Therapy- Main [facility] [phone]    3:49 PM, 03/24/23

## 2023-03-26 ENCOUNTER — Ambulatory Visit: Payer: Medicare HMO

## 2023-03-26 DIAGNOSIS — R278 Other lack of coordination: Secondary | ICD-10-CM

## 2023-03-26 DIAGNOSIS — M545 Low back pain, unspecified: Secondary | ICD-10-CM

## 2023-03-26 DIAGNOSIS — R2681 Unsteadiness on feet: Secondary | ICD-10-CM

## 2023-03-26 DIAGNOSIS — R2689 Other abnormalities of gait and mobility: Secondary | ICD-10-CM

## 2023-03-26 DIAGNOSIS — R269 Unspecified abnormalities of gait and mobility: Secondary | ICD-10-CM

## 2023-03-26 DIAGNOSIS — M6281 Muscle weakness (generalized): Secondary | ICD-10-CM | POA: Diagnosis not present

## 2023-03-26 DIAGNOSIS — R262 Difficulty in walking, not elsewhere classified: Secondary | ICD-10-CM

## 2023-03-26 NOTE — Therapy (Signed)
OUTPATIENT PHYSICAL THERAPY TREATMENT  Patient Name: Kendra Benton MRN: [id number] DOB:[birth year], 82 y.o., female Today's Date: 03/26/2023  END OF SESSION:  PT End of Session - 03/26/23 1632     Visit Number 31    Number of Visits 46   date corrected to reflect most recent recert   Date for PT Re-Evaluation 05/19/23    Authorization Type [employer] primary; [place] medicaid secondary    Authorization Time Period 09/11/22-12/04/22    Progress Note Due on Visit 40    PT Start Time 1629    PT Stop Time 1700    PT Time Calculation (min) 31 min    Equipment Utilized During Treatment Gait belt    Activity Tolerance Patient tolerated treatment well;No increased pain    Behavior During Therapy WFL for tasks assessed/performed                        Past Medical History:  Diagnosis Date   Allergy    Anxiety    GERD (gastroesophageal reflux disease)    Headache    History of kidney stones    Hypertension    Myocardial infarction (HCC)    1997   Osteoporosis    Pneumonia    Past Surgical History:  Procedure Laterality Date   ABDOMINAL HYSTERECTOMY     patient has ovaries   APPENDECTOMY     ARTERY BIOPSY Right 12/30/2017   Procedure: BIOPSY TEMPORAL ARTERY;  Surgeon: Renford Dills, MD;  Location: [facility];  Service: Vascular;  Laterality: Right;   ARTERY BIOPSY Left 06/18/2018   Procedure: BIOPSY TEMPORAL ARTERY;  Surgeon: Renford Dills, MD;  Location: [facility];  Service: Vascular;  Laterality: Left;   BACK SURGERY     CARDIAC CATHETERIZATION     CATARACT EXTRACTION W/ INTRAOCULAR LENS  IMPLANT, BILATERAL Bilateral 2010   CHOLECYSTECTOMY N/A 02/28/2016   Procedure: LAPAROSCOPIC CHOLECYSTECTOMY WITH INTRAOPERATIVE CHOLANGIOGRAM;  Surgeon: Tiney Rouge III, MD;  Location: [facility];  Service: General;  Laterality: N/A;   LUMBAR FUSION  11/09/2016   L3-L5   SPINE SURGERY     Disc   TONSILLECTOMY     Patient Active Problem List   Diagnosis Date Noted    Grade 1 Retrolisthesis of L2/L3 03/19/2023   Latex precautions, history of latex allergy 03/19/2023   At high risk for allergic reaction to latex 03/19/2023   History of allergy to latex 03/19/2023   Lumbar facet joint pain 03/04/2023   Compression fracture of L2 lumbar vertebra, sequela 03/04/2023   Pain at implanted Lumbar hardware 03/04/2023   Fall (on) (from) other stairs and steps, initial encounter 08/06/2020   Lumbar facet joint syndrome 06/18/2020   Spondylosis without myelopathy or radiculopathy, lumbosacral region 06/18/2020   DDD (degenerative disc disease), lumbosacral 06/18/2020   COVID 06/04/2020   Chronic pain syndrome 05/16/2020   Pharmacologic therapy 05/16/2020   Disorder of skeletal system 05/16/2020   Problems influencing health status 05/16/2020   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   Chronic hip pain (2ry area of Pain) (Bilateral) (R>L) 05/16/2020   Chronic lower extremity pain (Left) 05/16/2020   Numbness and tingling of both feet (intermittent/recurrent) 05/16/2020   Pain in rib (Bilateral) (R>L) (intermittent) 05/16/2020   Chronic knee pain (Left) 05/16/2020   Patellar pain (Left) (chronic, intermittent) 05/16/2020   Hard of hearing 05/16/2020   Carcinoma, lung, 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/25/2019   S/P partial lobectomy of lung 02/25/2019   Mass of upper  lobe of right lung 01/06/2019   Chronic nonintractable headache 06/14/2018   Chronic headache 12/03/2017   Sinus bradycardia 10/06/2017   Chronic low back pain (1ry area of Pain) (Bilateral) (L>R) w/o sciatica 08/17/2017   Panic attack as reaction to stress 08/17/2017   Anxiety 06/15/2017   Atypical chest pain 06/15/2017   Hyperlipidemia 06/15/2017   Cholecystitis with cholelithiasis 02/27/2016   Increased frequency of urination 01/27/2015   History of night sweats 05/01/2014   Dupuytren's contracture 07/18/2013   Inverted nipple 04/18/2013   Depression 04/23/2010   Fibromyalgia 04/23/2010    Hypercholesterolemia 04/23/2010   Essential hypertension 04/23/2010   Hypothyroidism 04/23/2010   Irritable colon 04/23/2010   Osteoarthritis 04/23/2010    PCP: Cain Sieve, MD  REFERRING PROVIDER: Cain Sieve, MD  REFERRING DIAG: Chronic low back pain; At risk for falls  Rationale for Evaluation and Treatment: Rehabilitation  THERAPY DIAG:  Muscle weakness (generalized)  Abnormality of gait and mobility  Difficulty in walking, not elsewhere classified  Other lack of coordination  Other abnormalities of gait and mobility  Unsteadiness on feet  Chronic bilateral low back pain without sciatica  ONSET DATE: chronic but worse since Jan 2024  SUBJECTIVE:                                                                                                                                                                                           SUBJECTIVE STATEMENT:  Patient reports doing okay- no increase in pain.    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 She has received PT in the past for condition with positive results and currently using pain management strategies including pain meds, Heat, Exercises, Yoga, lidocain patch, diclofenac topical gel, and biofreeze. She was HTN controlled with medication.  She reports still lives alone but having trouble with community and social outings and having to modify how she performs her ADLs and housework due to her chronic pain. PMH- Spondylosis, Scoliosis, HTN, Lung CA.  PAIN:  Are you having pain? Yes 6/10 across low back, midline  PRECAUTIONS: Fall  WEIGHT BEARING RESTRICTIONS: No  FALLS:  Has patient fallen in last 6 months? No- However patient reports some balance issues and near falls- using cane with community outings  PLOF: Independent  PATIENT GOALS: To decrease my pain  NEXT MD VISIT: unknown    OBJECTIVE:   TODAY'S TREATMENT:  DATE:  02/25/2023     Hip hike/glute med (1 LE standing on edge of 6" step and other LE hanging off- instructed to raise leg that is hanging off) x 12 reps each LE  Side step up onto 6" block then off then repeat back to opp side x 15 reps without UE support   Standing hip circles (around 2 vertically placed yoga blocks) x 20 reps each LE  Monster walk- GTB x 10 feet x 2    CGA from PT for safety for all NMR and standing therex throughout session.     PATIENT EDUCATION:  Education details: PT plan of care; Exercise technique  Person educated: Patient Education method: Explanation, Demonstration, Tactile cues, and Verbal cues Education comprehension: verbalized understanding, returned demonstration, verbal cues required, and tactile cues required  HOME EXERCISE PROGRAM: Access Code: [email or web address] URL: [email or web address] Date: 02/18/2023 Prepared by: Maureen Ralphs  Exercises - Seated Sciatic Tensioner  - 1 x daily - 7 x weekly - 3 sets - 20 hold      11/14/22:  Side step c red band around knees, back against wall   Access Code: [email or web address] URL: [email or web address] Date: 10/13/2022 Prepared by: Maureen Ralphs  Exercises - Standing Quadratus Lumborum Stretch with Doorway  - 1 x daily - 7 x weekly - 3 sets - 20-30 sec hold - Standing Quadratus Lumborum Mobilization with Small Ball on Wall  - 1 x daily - 7 x weekly - 3 sets - 10 reps - Supine Quadratus Lumborum Stretch  - 1 x daily - 7 x weekly - 3 sets - 20-30 hold - 90/90 SI Joint Self-Correction with Dowel  - 1 x daily - 7 x weekly - 3 sets - 10 reps - Hooklying Isometric Hip Abduction with Belt  - 1 x daily - 7 x weekly - 3 sets - 10 reps - 5 hold    Access Code: [email or web address] URL: [email or web address] Date:  10/09/2022 Prepared by: Maureen Ralphs  Exercises - Standing Quadratus Lumborum Stretch with Doorway  - 1 x daily - 7 x weekly - 3 sets - 20-30 sec hold - Standing Quadratus Lumborum Mobilization with Small Ball on Wall  - 1 x daily - 7 x weekly - 3 sets - 10 reps - Supine Quadratus Lumborum Stretch  - 1 x daily - 7 x weekly - 3 sets - 20-30 hold  ASSESSMENT:  CLINICAL IMPRESSION: Treatment limited secondary to patient late arrival but she presented with great motivation and performed well overall attempting several new hip strengthening exercises. She required some assist for proper technique yet performed well with fatigue as only limiting issue. Pt will continue to benefit from skilled physical therapy intervention to address impairments, improve QOL, and attain therapy goals.       OBJECTIVE IMPAIRMENTS: Abnormal gait, decreased activity tolerance, decreased balance, decreased endurance, decreased mobility, difficulty walking, decreased strength, and pain.   ACTIVITY LIMITATIONS: carrying, lifting, bending, sitting, standing, squatting, sleeping, stairs, transfers, and bed mobility  PARTICIPATION LIMITATIONS: cleaning, laundry, shopping, community activity, and yard work  PERSONAL FACTORS: Age, Time since onset of injury/illness/exacerbation, and 3+ comorbidities: scoliosis, HTN, degenerative spine  are also affecting patient's functional outcome.   REHAB POTENTIAL: Good  CLINICAL DECISION MAKING: Stable/uncomplicated  EVALUATION COMPLEXITY: Moderate   GOALS: Goals reviewed with patient? Yes  SHORT TERM GOALS: Target date: 10/24/2022  Pt will decrease worst back pain as reported on NPRS by at least 2 points in order to demonstrate clinically significant reduction in back pain.  Baseline: Eval= 8/10; 11/17/2022= 4/10 current and up to 7-8/10 at worst.  Goal status: MET  LONG TERM GOALS: Target date: 02/27/2023  Pt will be independent with Final HEP in order to improve  strength and decrease back pain in order to improve pain-free function at home and work.  Baseline: EVAL= Patient reports needs update and review of exercises to assist with her back pain; 11/17/2022- Patient performing mostly self stretching and yoga based exercises and will continue to benefit from adding to comprehensive HEP; 12/05/2022- HEP ongoing focusing more on Lumbar strengthening; 03/24/2023- patient has made shift to more balance/LE exercises but will benefit from further training and progression of HEP Goal status: ONGOING  2.  Pt will decrease worst back pain as reported on NPRS by at least 3 points in order to demonstrate clinically significant reduction in back pain.  Baseline: EVAL= 8/10; 11/17/2022= 4/10 current and up to 7-8/10 at worst. 12/05/2022= 4/10 current- and up to 6/10 depending on activity 01/12/23: 4/10 and is consistently at this level. 03/24/2023- general low back pain= 4/10.  Goal status: ONGOING  3.  Pt will decrease MODI score by at least 13 points in order demonstrate clinically significant reduction in back pain/disability.   Baseline: EVAL=58%; 11/17/2022= 40%;12/05/2022= 44% 01/12/23:38% Goal status: MET  4.  Pt will improve FOTO to target score of >55 to display perceived improvements in ability to complete ADL's.  Baseline: EVAL= 54; 11/14/22: 56; 12/05/2022=63 Goal status: MET  5.  Patient (> 69 years old) will complete five times sit to stand test in <15 seconds indicating an increased LE strength and improved balance.  Baseline: EVAL = 20.33; 11/17/2022= 16.55 sec without UE Support; 12/05/2022= 16.47 sec without UE support 01/12/23: 15.5 sec; 03/24/2023=14.38 sec without UE support Goal status: MET  6.  Pt will increase by at least 0.13 m/s in order to demonstrate clinically significant improvement in community ambulation.   Baseline: EVAL = 0.97 m/s; 6/10=0.98 m/s; 12/05/2022= 0.98 m/s 8/5:1.08 m/s; 03/24/2023= 0.96 m/s normal and 1.2 m/s (fast)  Goal status:  MET  7.  Patient will report ability to complete > or equal to 60 min grocery store outing or similar community activiities to return to her previous level of function.  Baseline: EVAL: patient reports difficulty tolerating outings in community. 12/05/2022= Patient reports up to around 40 min of continuous community activities.  01/12/23: last time she went she was up for an hour and used cart and had no issues, has some issues with heat but that has been goingn on for 20 years per pt .  Goal status: MET    8. Pt will improve BERG by at least 3 points in order to demonstrate clinically significant improvement in balance.    Baseline: 09/30/2022= 47/56; 12/05/2022= 53/56  Goal status: MET  9. Pt will increase by at least 32m (174ft) in order to demonstrate clinically significant improvement in cardiopulmonary endurance and community ambulation   Baseline: 09/23/2022= use of SPC RUE (pt preference): starting pain 4/10, then 5/10 at 369ft, and 6/10 at 573ft: audible RR dyspnea. Test terminated after 613ft, 3 minutes 15 seconds; 11/17/2022= 920 feet in 5 min 37 sec c/o 4/10 LBP and some DOE- O2 sat unable to read due to cold fingers- Respirations= 23/min; 12/05/2022= 1050 feet in 6 min  01/12/23: 1030 ft Spo2 95% post bout  Goal status: MET   7. Pt will improve FGA by at least 3 points in order to demonstrate clinically significant improvement in  balance and decreased risk for falls.  Baseline: 03/24/2023= 23  Goal status: NEW  8. Patient will transition from skilled PT to gym based program for long term management of strength training.   Baseline: Patient currently involved in skilled PT program for LBP/balance.  Goal status: New  9. Patient will present with improved dynamic standing balance as seen by SLS  consistently > 10 sec for improved dynamic mobility.   Baseline: 03/24/2023= 4-8 sec at bes  Goal status: NEW  PLAN:  PT FREQUENCY: 1-2x/week  PT DURATION: 12 weeks  PLANNED  INTERVENTIONS: Therapeutic exercises, Therapeutic activity, Neuromuscular re-education, Balance training, Gait training, Patient/Family education, Self Care, Joint mobilization, Joint manipulation, Stair training, Vestibular training, Canalith repositioning, Orthotic/Fit training, DME instructions, Dry Needling, Electrical stimulation, Spinal manipulation, Spinal mobilization, Cryotherapy, Moist heat, scar mobilization, Taping, and Manual therapy.  PLAN FOR NEXT SESSION:      Functional LE strengthening and core strength, dynamic balance training.  [employer] gym based instruction next visit    Lenda Kelp, PT 03/26/2023, 5:14 PM   5:14 PM, 03/26/23 Physical Therapist - [facility] [phone]    5:14 PM, 03/26/23

## 2023-03-30 ENCOUNTER — Ambulatory Visit: Payer: Medicare HMO

## 2023-03-30 DIAGNOSIS — R2681 Unsteadiness on feet: Secondary | ICD-10-CM

## 2023-03-30 DIAGNOSIS — M6281 Muscle weakness (generalized): Secondary | ICD-10-CM | POA: Diagnosis not present

## 2023-03-30 DIAGNOSIS — G8929 Other chronic pain: Secondary | ICD-10-CM

## 2023-03-30 DIAGNOSIS — R278 Other lack of coordination: Secondary | ICD-10-CM

## 2023-03-30 DIAGNOSIS — R269 Unspecified abnormalities of gait and mobility: Secondary | ICD-10-CM

## 2023-03-30 DIAGNOSIS — R262 Difficulty in walking, not elsewhere classified: Secondary | ICD-10-CM

## 2023-03-30 DIAGNOSIS — R2689 Other abnormalities of gait and mobility: Secondary | ICD-10-CM

## 2023-03-30 NOTE — Therapy (Signed)
OUTPATIENT PHYSICAL THERAPY TREATMENT  Patient Name: IRISSA FOGLIA MRN: [id number] DOB:February 21, 1941, 82 y.o., female Today's Date: 03/30/2023  END OF SESSION:  PT End of Session - 03/30/23 1303     Visit Number 32    Number of Visits 46   date corrected to reflect most recent recert   Date for PT Re-Evaluation 05/19/23    Authorization Type [employer] primary; [place] medicaid secondary    Authorization Time Period 09/11/22-12/04/22    Progress Note Due on Visit 40    PT Start Time 1308    Equipment Utilized During Treatment Gait belt    Activity Tolerance Patient tolerated treatment well;No increased pain    Behavior During Therapy WFL for tasks assessed/performed                        Past Medical History:  Diagnosis Date   Allergy    Anxiety    GERD (gastroesophageal reflux disease)    Headache    History of kidney stones    Hypertension    Myocardial infarction (HCC)    1997   Osteoporosis    Pneumonia    Past Surgical History:  Procedure Laterality Date   ABDOMINAL HYSTERECTOMY     patient has ovaries   APPENDECTOMY     ARTERY BIOPSY Right 12/30/2017   Procedure: BIOPSY TEMPORAL ARTERY;  Surgeon: Renford Dills, MD;  Location: [facility];  Service: Vascular;  Laterality: Right;   ARTERY BIOPSY Left 06/18/2018   Procedure: BIOPSY TEMPORAL ARTERY;  Surgeon: Renford Dills, MD;  Location: [facility];  Service: Vascular;  Laterality: Left;   BACK SURGERY     CARDIAC CATHETERIZATION     CATARACT EXTRACTION W/ INTRAOCULAR LENS  IMPLANT, BILATERAL Bilateral 2010   CHOLECYSTECTOMY N/A 02/28/2016   Procedure: LAPAROSCOPIC CHOLECYSTECTOMY WITH INTRAOPERATIVE CHOLANGIOGRAM;  Surgeon: Tiney Rouge III, MD;  Location: [facility];  Service: General;  Laterality: N/A;   LUMBAR FUSION  11/09/2016   L3-L5   SPINE SURGERY     Disc   TONSILLECTOMY     Patient Active Problem List   Diagnosis Date Noted   Grade 1 Retrolisthesis of L2/L3 03/19/2023   Latex  precautions, history of latex allergy 03/19/2023   At high risk for allergic reaction to latex 03/19/2023   History of allergy to latex 03/19/2023   Lumbar facet joint pain 03/04/2023   Compression fracture of L2 lumbar vertebra, sequela 03/04/2023   Pain at implanted Lumbar hardware 03/04/2023   Fall (on) (from) other stairs and steps, initial encounter 08/06/2020   Lumbar facet joint syndrome 06/18/2020   Spondylosis without myelopathy or radiculopathy, lumbosacral region 06/18/2020   DDD (degenerative disc disease), lumbosacral 06/18/2020   COVID 06/04/2020   Chronic pain syndrome 05/16/2020   Pharmacologic therapy 05/16/2020   Disorder of skeletal system 05/16/2020   Problems influencing health status 05/16/2020   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   Chronic hip pain (2ry area of Pain) (Bilateral) (R>L) 05/16/2020   Chronic lower extremity pain (Left) 05/16/2020   Numbness and tingling of both feet (intermittent/recurrent) 05/16/2020   Pain in rib (Bilateral) (R>L) (intermittent) 05/16/2020   Chronic knee pain (Left) 05/16/2020   Patellar pain (Left) (chronic, intermittent) 05/16/2020   Hard of hearing 05/16/2020   Carcinoma, lung, 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/25/2019   S/P partial lobectomy of lung 02/25/2019   Mass of upper lobe of right lung 01/06/2019   Chronic nonintractable headache 06/14/2018   Chronic headache 12/03/2017  Sinus bradycardia 10/06/2017   Chronic low back pain (1ry area of Pain) (Bilateral) (L>R) w/o sciatica 08/17/2017   Panic attack as reaction to stress 08/17/2017   Anxiety 06/15/2017   Atypical chest pain 06/15/2017   Hyperlipidemia 06/15/2017   Cholecystitis with cholelithiasis 02/27/2016   Increased frequency of urination 01/27/2015   History of night sweats 05/01/2014   Dupuytren's contracture 07/18/2013   Inverted nipple 04/18/2013   Depression 04/23/2010   Fibromyalgia 04/23/2010   Hypercholesterolemia 04/23/2010   Essential hypertension  04/23/2010   Hypothyroidism 04/23/2010   Irritable colon 04/23/2010   Osteoarthritis 04/23/2010    PCP: Cain Sieve, MD  REFERRING PROVIDER: Cain Sieve, MD  REFERRING DIAG: Chronic low back pain; At risk for falls  Rationale for Evaluation and Treatment: Rehabilitation  THERAPY DIAG:  Muscle weakness (generalized)  Abnormality of gait and mobility  Difficulty in walking, not elsewhere classified  Other lack of coordination  Other abnormalities of gait and mobility  Unsteadiness on feet  Chronic bilateral low back pain without sciatica  ONSET DATE: chronic but worse since Jan 2024  SUBJECTIVE:                                                                                                                                                                                           SUBJECTIVE STATEMENT:  Patient reports back is bothering her some today but was able to go to yoga earlier today.     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 She has received PT in the past for condition with positive results and currently using pain management strategies including pain meds, Heat, Exercises, Yoga, lidocain patch, diclofenac topical gel, and biofreeze. She was HTN controlled with medication.  She reports still lives alone but having trouble with community and social outings and having to modify how she performs her ADLs and housework due to her chronic pain. PMH- Spondylosis, Scoliosis, HTN, Lung CA.  PAIN:  Are you having pain? Yes 6/10 across low back, midline  PRECAUTIONS: Fall  WEIGHT BEARING RESTRICTIONS: No  FALLS:  Has patient fallen in last 6 months? No- However patient reports some balance issues and near falls- using cane with community outings  PLOF: Independent  PATIENT GOALS: To decrease my pain  NEXT MD VISIT: unknown   OBJECTIVE:   TODAY'S  TREATMENT:  DATE:  03/30/23       Therex: (instruction in [employer] exercises today)   Knee  ext  L2 - simultaneous using both LE- 2 sets of 12 reps  Knee flex L2 - simultaneous using both LE- 2 sets of 12 reps Leg press- L3- Simultaneous both LE - 2 sets of 12 reps Calf press- LE- Simultaneous both LE- 2 sets of 12 reps Suitcase squat with 5# AW- x 12 reps  Forward lunges - 1 UE support x 12 reps each Reverse lunges- 1 UE support x 12 reps each Standing calf raise 5# x 10 reps Hip march (1 UE support)  x 10 reps each LE    CGA from PT for safety for all  standing therex throughout session.     PATIENT EDUCATION:  Education details: PT plan of care; Exercise technique  Person educated: Patient Education method: Explanation, Demonstration, Tactile cues, and Verbal cues Education comprehension: verbalized understanding, returned demonstration, verbal cues required, and tactile cues required  HOME EXERCISE PROGRAM: Access Code: [email or web address] URL: [email or web address] Date: 02/18/2023 Prepared by: Maureen Ralphs  Exercises - Seated Sciatic Tensioner  - 1 x daily - 7 x weekly - 3 sets - 20 hold      11/14/22:  Side step c red band around knees, back against wall   Access Code: [email or web address] URL: [email or web address] Date: 10/13/2022 Prepared by: Maureen Ralphs  Exercises - Standing Quadratus Lumborum Stretch with Doorway  - 1 x daily - 7 x weekly - 3 sets - 20-30 sec hold - Standing Quadratus Lumborum Mobilization with Small Ball on Wall  - 1 x daily - 7 x weekly - 3 sets - 10 reps - Supine Quadratus Lumborum Stretch  - 1 x daily - 7 x weekly - 3 sets - 20-30 hold - 90/90 SI Joint Self-Correction with Dowel  - 1 x daily - 7 x weekly - 3 sets - 10 reps - Hooklying Isometric Hip Abduction with Belt  - 1 x daily - 7 x  weekly - 3 sets - 10 reps - 5 hold    Access Code: [email or web address] URL: [email or web address] Date: 10/09/2022 Prepared by: Maureen Ralphs  Exercises - Standing Quadratus Lumborum Stretch with Doorway  - 1 x daily - 7 x weekly - 3 sets - 20-30 sec hold - Standing Quadratus Lumborum Mobilization with Small Ball on Wall  - 1 x daily - 7 x weekly - 3 sets - 10 reps - Supine Quadratus Lumborum Stretch  - 1 x daily - 7 x weekly - 3 sets - 20-30 hold  ASSESSMENT:  CLINICAL IMPRESSION: Treatment focused on gym type exercises to shift focus toward plan for transition to home program. She responded favorably with no increase in low back pain with all LE strengthening exercises. She required instruction with use of equipment for safety and frequency, amount of appropriate resistance.  Pt will continue to benefit from skilled physical therapy intervention to address impairments, improve QOL, and attain therapy goals.       OBJECTIVE IMPAIRMENTS: Abnormal gait, decreased activity tolerance, decreased balance, decreased endurance, decreased mobility, difficulty walking, decreased strength, and pain.   ACTIVITY LIMITATIONS: carrying, lifting, bending, sitting, standing, squatting, sleeping, stairs, transfers, and bed mobility  PARTICIPATION LIMITATIONS: cleaning, laundry, shopping, community activity, and yard work  PERSONAL FACTORS: Age, Time since onset of injury/illness/exacerbation, and 3+ comorbidities: scoliosis, HTN, degenerative spine  are also affecting patient's functional outcome.   REHAB POTENTIAL: Good  CLINICAL DECISION MAKING: Stable/uncomplicated  EVALUATION COMPLEXITY: Moderate  GOALS: Goals reviewed with patient? Yes  SHORT TERM GOALS: Target date: 10/24/2022  Pt will decrease worst back pain as reported on NPRS by at least 2 points in order to demonstrate clinically significant reduction in back pain.  Baseline: Eval= 8/10; 11/17/2022= 4/10 current and up to  7-8/10 at worst.  Goal status: MET  LONG TERM GOALS: Target date: 02/27/2023  Pt will be independent with Final HEP in order to improve strength and decrease back pain in order to improve pain-free function at home and work.  Baseline: EVAL= Patient reports needs update and review of exercises to assist with her back pain; 11/17/2022- Patient performing mostly self stretching and yoga based exercises and will continue to benefit from adding to comprehensive HEP; 12/05/2022- HEP ongoing focusing more on Lumbar strengthening; 03/24/2023- patient has made shift to more balance/LE exercises but will benefit from further training and progression of HEP Goal status: ONGOING  2.  Pt will decrease worst back pain as reported on NPRS by at least 3 points in order to demonstrate clinically significant reduction in back pain.  Baseline: EVAL= 8/10; 11/17/2022= 4/10 current and up to 7-8/10 at worst. 12/05/2022= 4/10 current- and up to 6/10 depending on activity 01/12/23: 4/10 and is consistently at this level. 03/24/2023- general low back pain= 4/10.  Goal status: ONGOING  3.  Pt will decrease MODI score by at least 13 points in order demonstrate clinically significant reduction in back pain/disability.   Baseline: EVAL=58%; 11/17/2022= 40%;12/05/2022= 44% 01/12/23:38% Goal status: MET  4.  Pt will improve FOTO to target score of >55 to display perceived improvements in ability to complete ADL's.  Baseline: EVAL= 54; 11/14/22: 56; 12/05/2022=63 Goal status: MET  5.  Patient (> 32 years old) will complete five times sit to stand test in <15 seconds indicating an increased LE strength and improved balance.  Baseline: EVAL = 20.33; 11/17/2022= 16.55 sec without UE Support; 12/05/2022= 16.47 sec without UE support 01/12/23: 15.5 sec; 03/24/2023=14.38 sec without UE support Goal status: MET  6.  Pt will increase by at least 0.13 m/s in order to demonstrate clinically significant improvement in community ambulation.    Baseline: EVAL = 0.97 m/s; 6/10=0.98 m/s; 12/05/2022= 0.98 m/s 8/5:1.08 m/s; 03/24/2023= 0.96 m/s normal and 1.2 m/s (fast)  Goal status: MET  7.  Patient will report ability to complete > or equal to 60 min grocery store outing or similar community activiities to return to her previous level of function.  Baseline: EVAL: patient reports difficulty tolerating outings in community. 12/05/2022= Patient reports up to around 40 min of continuous community activities.  01/12/23: last time she went she was up for an hour and used cart and had no issues, has some issues with heat but that has been goingn on for 20 years per pt .  Goal status: MET    8. Pt will improve BERG by at least 3 points in order to demonstrate clinically significant improvement in balance.    Baseline: 09/30/2022= 47/56; 12/05/2022= 53/56  Goal status: MET  9. Pt will increase by at least 57m (153ft) in order to demonstrate clinically significant improvement in cardiopulmonary endurance and community ambulation   Baseline: 09/23/2022= use of SPC RUE (pt preference): starting pain 4/10, then 5/10 at 380ft, and 6/10 at 564ft: audible RR dyspnea. Test terminated after 640ft, 3 minutes 15 seconds; 11/17/2022= 920 feet in 5 min 37 sec c/o 4/10 LBP and some DOE- O2 sat unable to read due to cold fingers-  Respirations= 23/min; 12/05/2022= 1050 feet in 6 min  01/12/23: 1030 ft Spo2 95% post bout  Goal status: MET   7. Pt will improve FGA by at least 3 points in order to demonstrate clinically significant improvement in balance and decreased risk for falls.  Baseline: 03/24/2023= 23  Goal status: NEW  8. Patient will transition from skilled PT to gym based program for long term management of strength training.   Baseline: Patient currently involved in skilled PT program for LBP/balance.  Goal status: New  9. Patient will present with improved dynamic standing balance as seen by SLS  consistently > 10 sec for improved dynamic mobility.    Baseline: 03/24/2023= 4-8 sec at bes  Goal status: NEW  PLAN:  PT FREQUENCY: 1-2x/week  PT DURATION: 12 weeks  PLANNED INTERVENTIONS: Therapeutic exercises, Therapeutic activity, Neuromuscular re-education, Balance training, Gait training, Patient/Family education, Self Care, Joint mobilization, Joint manipulation, Stair training, Vestibular training, Canalith repositioning, Orthotic/Fit training, DME instructions, Dry Needling, Electrical stimulation, Spinal manipulation, Spinal mobilization, Cryotherapy, Moist heat, scar mobilization, Taping, and Manual therapy.  PLAN FOR NEXT SESSION:      Functional LE strengthening and core strength, dynamic balance training.  [employer] gym based instruction next visit    Lenda Kelp, PT 03/30/2023, 1:37 PM   1:37 PM, 03/30/23 Physical Therapist - [facility] [address]  Outpatient Physical Therapy- [facility] [phone]    1:37 PM, 03/30/23

## 2023-04-01 ENCOUNTER — Ambulatory Visit: Payer: Medicare HMO

## 2023-04-01 DIAGNOSIS — G8929 Other chronic pain: Secondary | ICD-10-CM

## 2023-04-01 DIAGNOSIS — R2689 Other abnormalities of gait and mobility: Secondary | ICD-10-CM

## 2023-04-01 DIAGNOSIS — M6281 Muscle weakness (generalized): Secondary | ICD-10-CM

## 2023-04-01 DIAGNOSIS — R278 Other lack of coordination: Secondary | ICD-10-CM

## 2023-04-01 DIAGNOSIS — R262 Difficulty in walking, not elsewhere classified: Secondary | ICD-10-CM

## 2023-04-01 DIAGNOSIS — R2681 Unsteadiness on feet: Secondary | ICD-10-CM

## 2023-04-01 DIAGNOSIS — R269 Unspecified abnormalities of gait and mobility: Secondary | ICD-10-CM

## 2023-04-01 NOTE — Therapy (Signed)
OUTPATIENT PHYSICAL THERAPY TREATMENT  Patient Name: Kendra Benton MRN: [id number] DOB:07/26/1940, 82 y.o., female Today's Date: 04/01/2023  END OF SESSION:  PT End of Session - 04/01/23 1411     Visit Number 33    Number of Visits 46   date corrected to reflect most recent recert   Date for PT Re-Evaluation 05/19/23    Authorization Type [employer] primary; [place] medicaid secondary    Authorization Time Period 09/11/22-12/04/22    Progress Note Due on Visit 40    PT Start Time 1404    PT Stop Time 1445    PT Time Calculation (min) 41 min    Equipment Utilized During Treatment Gait belt    Activity Tolerance Patient tolerated treatment well;No increased pain    Behavior During Therapy WFL for tasks assessed/performed                        Past Medical History:  Diagnosis Date   Allergy    Anxiety    GERD (gastroesophageal reflux disease)    Headache    History of kidney stones    Hypertension    Myocardial infarction (HCC)    1997   Osteoporosis    Pneumonia    Past Surgical History:  Procedure Laterality Date   ABDOMINAL HYSTERECTOMY     patient has ovaries   APPENDECTOMY     ARTERY BIOPSY Right 12/30/2017   Procedure: BIOPSY TEMPORAL ARTERY;  Surgeon: Renford Dills, MD;  Location: [facility];  Service: Vascular;  Laterality: Right;   ARTERY BIOPSY Left 06/18/2018   Procedure: BIOPSY TEMPORAL ARTERY;  Surgeon: Renford Dills, MD;  Location: [facility];  Service: Vascular;  Laterality: Left;   BACK SURGERY     CARDIAC CATHETERIZATION     CATARACT EXTRACTION W/ INTRAOCULAR LENS  IMPLANT, BILATERAL Bilateral 2010   CHOLECYSTECTOMY N/A 02/28/2016   Procedure: LAPAROSCOPIC CHOLECYSTECTOMY WITH INTRAOPERATIVE CHOLANGIOGRAM;  Surgeon: Tiney Rouge III, MD;  Location: [facility];  Service: General;  Laterality: N/A;   LUMBAR FUSION  11/09/2016   L3-L5   SPINE SURGERY     Disc   TONSILLECTOMY     Patient Active Problem List   Diagnosis Date Noted    Grade 1 Retrolisthesis of L2/L3 03/19/2023   Latex precautions, history of latex allergy 03/19/2023   At high risk for allergic reaction to latex 03/19/2023   History of allergy to latex 03/19/2023   Lumbar facet joint pain 03/04/2023   Compression fracture of L2 lumbar vertebra, sequela 03/04/2023   Pain at implanted Lumbar hardware 03/04/2023   Fall (on) (from) other stairs and steps, initial encounter 08/06/2020   Lumbar facet joint syndrome 06/18/2020   Spondylosis without myelopathy or radiculopathy, lumbosacral region 06/18/2020   DDD (degenerative disc disease), lumbosacral 06/18/2020   COVID 06/04/2020   Chronic pain syndrome 05/16/2020   Pharmacologic therapy 05/16/2020   Disorder of skeletal system 05/16/2020   Problems influencing health status 05/16/2020   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   Chronic hip pain (2ry area of Pain) (Bilateral) (R>L) 05/16/2020   Chronic lower extremity pain (Left) 05/16/2020   Numbness and tingling of both feet (intermittent/recurrent) 05/16/2020   Pain in rib (Bilateral) (R>L) (intermittent) 05/16/2020   Chronic knee pain (Left) 05/16/2020   Patellar pain (Left) (chronic, intermittent) 05/16/2020   Hard of hearing 05/16/2020   Carcinoma, lung, 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/25/2019   S/P partial lobectomy of lung 02/25/2019   Mass of upper  lobe of right lung 01/06/2019   Chronic nonintractable headache 06/14/2018   Chronic headache 12/03/2017   Sinus bradycardia 10/06/2017   Chronic low back pain (1ry area of Pain) (Bilateral) (L>R) w/o sciatica 08/17/2017   Panic attack as reaction to stress 08/17/2017   Anxiety 06/15/2017   Atypical chest pain 06/15/2017   Hyperlipidemia 06/15/2017   Cholecystitis with cholelithiasis 02/27/2016   Increased frequency of urination 01/27/2015   History of night sweats 05/01/2014   Dupuytren's contracture 07/18/2013   Inverted nipple 04/18/2013   Depression 04/23/2010   Fibromyalgia 04/23/2010    Hypercholesterolemia 04/23/2010   Essential hypertension 04/23/2010   Hypothyroidism 04/23/2010   Irritable colon 04/23/2010   Osteoarthritis 04/23/2010    PCP: Cain Sieve, MD  REFERRING PROVIDER: Cain Sieve, MD  REFERRING DIAG: Chronic low back pain; At risk for falls  Rationale for Evaluation and Treatment: Rehabilitation  THERAPY DIAG:  Muscle weakness (generalized)  Abnormality of gait and mobility  Difficulty in walking, not elsewhere classified  Other lack of coordination  Other abnormalities of gait and mobility  Unsteadiness on feet  Chronic bilateral low back pain without sciatica  ONSET DATE: chronic but worse since Jan 2024  SUBJECTIVE:                                                                                                                                                                                           SUBJECTIVE STATEMENT:  Patient reports back is bothering her today. Pt reported 6/10 on NPS in lumbar spine.     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 She has received PT in the past for condition with positive results and currently using pain management strategies including pain meds, Heat, Exercises, Yoga, lidocain patch, diclofenac topical gel, and biofreeze. She was HTN controlled with medication.  She reports still lives alone but having trouble with community and social outings and having to modify how she performs her ADLs and housework due to her chronic pain. PMH- Spondylosis, Scoliosis, HTN, Lung CA.  PAIN:  Are you having pain? Yes 6/10 across low back, midline  PRECAUTIONS: Fall  WEIGHT BEARING RESTRICTIONS: No  FALLS:  Has patient fallen in last 6 months? No- However patient reports some balance issues and near falls- using cane with community outings  PLOF: Independent  PATIENT GOALS: To decrease my  pain  NEXT MD VISIT: unknown   OBJECTIVE:   TODAY'S TREATMENT:  DATE:  04/01/23       Therex:  Forward trunk flexion in sitting 2x30 seconds Figure 4 stretch in sitting 2x30 seconds each side Sciatic nerve flossing in sitting 10 reps each side  Single Leg Hip Hikers w/ 4 inch step on R side 1x8 (ceased due to pain) Marching w/ 4 in step 20 reps without UE support Backwards step downs w/ 4 in step 1x12 each side without UE support Elevated tandem stance w/ 4 in step w/ lateral head turns 10 reps each side Elevated tandem stance w/ 4 in step w/ vertical head turns 10 reps each side   SLS 2x20 seconds each side  Calf raises 1x15 SL Butt kicks 2x10 each side    CGA from PT for safety for all  standing therex throughout session.     PATIENT EDUCATION:  Education details: PT plan of care; Exercise technique  Person educated: Patient Education method: Explanation, Demonstration, Tactile cues, and Verbal cues Education comprehension: verbalized understanding, returned demonstration, verbal cues required, and tactile cues required  HOME EXERCISE PROGRAM: Access Code: [email or web address] URL: [email or web address] Date: 02/18/2023 Prepared by: Maureen Ralphs  Exercises - Seated Sciatic Tensioner  - 1 x daily - 7 x weekly - 3 sets - 20 hold      11/14/22:  Side step c red band around knees, back against wall   Access Code: [email or web address] URL: [email or web address] Date: 10/13/2022 Prepared by: Maureen Ralphs  Exercises - Standing Quadratus Lumborum Stretch with Doorway  - 1 x daily - 7 x weekly - 3 sets - 20-30 sec hold - Standing Quadratus Lumborum Mobilization with Small Ball on Wall  - 1 x daily - 7 x weekly - 3 sets - 10 reps - Supine Quadratus Lumborum Stretch  - 1 x daily - 7 x weekly - 3 sets - 20-30 hold - 90/90 SI Joint  Self-Correction with Dowel  - 1 x daily - 7 x weekly - 3 sets - 10 reps - Hooklying Isometric Hip Abduction with Belt  - 1 x daily - 7 x weekly - 3 sets - 10 reps - 5 hold    Access Code: [email or web address] URL: [email or web address] Date: 10/09/2022 Prepared by: Maureen Ralphs  Exercises - Standing Quadratus Lumborum Stretch with Doorway  - 1 x daily - 7 x weekly - 3 sets - 20-30 sec hold - Standing Quadratus Lumborum Mobilization with Small Ball on Wall  - 1 x daily - 7 x weekly - 3 sets - 10 reps - Supine Quadratus Lumborum Stretch  - 1 x daily - 7 x weekly - 3 sets - 20-30 hold  ASSESSMENT:  CLINICAL IMPRESSION: Treatment  limited due to patient report of low back pain today. All activities tailored to avoid low back. Patient exhibited much improved overall SLS - able to stand 20 sec each LE- Demonstrating significant improvement since last measured in goal section. Only minimal pain reported today and exercises were modified or ceased appropriate. Pt will continue to benefit from skilled physical therapy intervention to address impairments, improve QOL, and attain therapy goals.       OBJECTIVE IMPAIRMENTS: Abnormal gait, decreased activity tolerance, decreased balance, decreased endurance, decreased mobility, difficulty walking, decreased strength, and pain.   ACTIVITY LIMITATIONS: carrying, lifting, bending, sitting, standing, squatting, sleeping, stairs, transfers, and bed mobility  PARTICIPATION LIMITATIONS: cleaning, laundry, shopping, community activity, and yard work  PERSONAL FACTORS: Age, Time since onset of injury/illness/exacerbation, and 3+ comorbidities: scoliosis, HTN, degenerative spine  are also affecting patient's functional outcome.  REHAB POTENTIAL: Good  CLINICAL DECISION MAKING: Stable/uncomplicated  EVALUATION COMPLEXITY: Moderate   GOALS: Goals reviewed with patient? Yes  SHORT TERM GOALS: Target date: 10/24/2022  Pt will decrease worst  back pain as reported on NPRS by at least 2 points in order to demonstrate clinically significant reduction in back pain.  Baseline: Eval= 8/10; 11/17/2022= 4/10 current and up to 7-8/10 at worst.  Goal status: MET  LONG TERM GOALS: Target date: 02/27/2023  Pt will be independent with Final HEP in order to improve strength and decrease back pain in order to improve pain-free function at home and work.  Baseline: EVAL= Patient reports needs update and review of exercises to assist with her back pain; 11/17/2022- Patient performing mostly self stretching and yoga based exercises and will continue to benefit from adding to comprehensive HEP; 12/05/2022- HEP ongoing focusing more on Lumbar strengthening; 03/24/2023- patient has made shift to more balance/LE exercises but will benefit from further training and progression of HEP Goal status: ONGOING  2.  Pt will decrease worst back pain as reported on NPRS by at least 3 points in order to demonstrate clinically significant reduction in back pain.  Baseline: EVAL= 8/10; 11/17/2022= 4/10 current and up to 7-8/10 at worst. 12/05/2022= 4/10 current- and up to 6/10 depending on activity 01/12/23: 4/10 and is consistently at this level. 03/24/2023- general low back pain= 4/10.  Goal status: ONGOING  3.  Pt will decrease MODI score by at least 13 points in order demonstrate clinically significant reduction in back pain/disability.   Baseline: EVAL=58%; 11/17/2022= 40%;12/05/2022= 44% 01/12/23:38% Goal status: MET  4.  Pt will improve FOTO to target score of >55 to display perceived improvements in ability to complete ADL's.  Baseline: EVAL= 54; 11/14/22: 56; 12/05/2022=63 Goal status: MET  5.  Patient (> 10 years old) will complete five times sit to stand test in <15 seconds indicating an increased LE strength and improved balance.  Baseline: EVAL = 20.33; 11/17/2022= 16.55 sec without UE Support; 12/05/2022= 16.47 sec without UE support 01/12/23: 15.5 sec; 03/24/2023=14.38  sec without UE support Goal status: MET  6.  Pt will increase by at least 0.13 m/s in order to demonstrate clinically significant improvement in community ambulation.   Baseline: EVAL = 0.97 m/s; 6/10=0.98 m/s; 12/05/2022= 0.98 m/s 8/5:1.08 m/s; 03/24/2023= 0.96 m/s normal and 1.2 m/s (fast)  Goal status: MET  7.  Patient will report ability to complete > or equal to 60 min grocery store outing or similar community activiities to return to her previous level of function.  Baseline: EVAL: patient reports difficulty tolerating outings in community. 12/05/2022= Patient reports up to around 40 min of continuous community activities.  01/12/23: last time she went she was up for an hour and used cart and had no issues, has some issues with heat but that has been goingn on for 20 years per pt .  Goal status: MET    8. Pt will improve BERG by at least 3 points in order to demonstrate clinically significant improvement in balance.    Baseline: 09/30/2022= 47/56; 12/05/2022= 53/56  Goal status: MET  9. Pt will increase by at least 55m (182ft) in order to demonstrate clinically significant improvement in cardiopulmonary endurance and community ambulation   Baseline: 09/23/2022= use of SPC RUE (pt preference): starting pain 4/10, then 5/10 at 375ft, and 6/10 at 539ft: audible RR dyspnea. Test terminated after 617ft, 3 minutes 15 seconds; 11/17/2022= 920 feet in 5 min 37 sec c/o  4/10 LBP and some DOE- O2 sat unable to read due to cold fingers- Respirations= 23/min; 12/05/2022= 1050 feet in 6 min  01/12/23: 1030 ft Spo2 95% post bout  Goal status: MET   7. Pt will improve FGA by at least 3 points in order to demonstrate clinically significant improvement in balance and decreased risk for falls.  Baseline: 03/24/2023= 23  Goal status: NEW  8. Patient will transition from skilled PT to gym based program for long term management of strength training.   Baseline: Patient currently involved in skilled PT  program for LBP/balance.  Goal status: New  9. Patient will present with improved dynamic standing balance as seen by SLS  consistently > 10 sec for improved dynamic mobility.   Baseline: 03/24/2023= 4-8 sec at bes  Goal status: NEW  PLAN:  PT FREQUENCY: 1-2x/week  PT DURATION: 12 weeks  PLANNED INTERVENTIONS: Therapeutic exercises, Therapeutic activity, Neuromuscular re-education, Balance training, Gait training, Patient/Family education, Self Care, Joint mobilization, Joint manipulation, Stair training, Vestibular training, Canalith repositioning, Orthotic/Fit training, DME instructions, Dry Needling, Electrical stimulation, Spinal manipulation, Spinal mobilization, Cryotherapy, Moist heat, scar mobilization, Taping, and Manual therapy.  PLAN FOR NEXT SESSION:      Functional LE strengthening and core strength, dynamic balance training.  [employer] gym based instruction next visit    Lenda Kelp, PT 04/01/2023, 3:05 PM   3:05 PM, 04/01/23 Physical Therapist - [facility]  Outpatient Physical Therapy- Main [facility] [phone]    3:05 PM, 04/01/23

## 2023-04-02 ENCOUNTER — Encounter: Payer: Self-pay | Admitting: Pain Medicine

## 2023-04-02 ENCOUNTER — Ambulatory Visit: Payer: Medicare HMO | Attending: Pain Medicine | Admitting: Pain Medicine

## 2023-04-02 VITALS — BP 158/60 | HR 75 | Temp 97.7°F | Resp 16 | Ht 62.0 in | Wt 130.0 lb

## 2023-04-02 DIAGNOSIS — T85848S Pain due to other internal prosthetic devices, implants and grafts, sequela: Secondary | ICD-10-CM | POA: Diagnosis present

## 2023-04-02 DIAGNOSIS — G8929 Other chronic pain: Secondary | ICD-10-CM | POA: Insufficient documentation

## 2023-04-02 DIAGNOSIS — Z09 Encounter for follow-up examination after completed treatment for conditions other than malignant neoplasm: Secondary | ICD-10-CM | POA: Diagnosis present

## 2023-04-02 DIAGNOSIS — M5459 Other low back pain: Secondary | ICD-10-CM | POA: Insufficient documentation

## 2023-04-02 DIAGNOSIS — M47816 Spondylosis without myelopathy or radiculopathy, lumbar region: Secondary | ICD-10-CM | POA: Insufficient documentation

## 2023-04-02 DIAGNOSIS — M545 Low back pain, unspecified: Secondary | ICD-10-CM | POA: Insufficient documentation

## 2023-04-02 NOTE — Progress Notes (Signed)
Safety precautions to be maintained throughout the outpatient stay will include: orient to surroundings, keep bed in low position, maintain call bell within reach at all times, provide assistance with transfer out of bed and ambulation.  

## 2023-04-02 NOTE — Patient Instructions (Signed)
______________________________________________________________________    OTC Supplements:   The following is a list of over-the-counter (OTC) supplements that have been found to have [employer]) studies suggesting that they may be of some benefits when used in moderation in some chronic pain-related conditions.  NOTE:  Always consult with your primary care provider and/or pharmacist before taking any OTC medications to make sure they will not interact with your current medications. Always use manufacturer's recommended dosage.  Supplement Possible benefit May be of benefit in treatment of  Active ingredient(s)  Turmeric/curcumin anti-inflammatory Joint and muscle aches and pain associated with arthritis and inflammation The main active ingredient in turmeric is curcumin.  Glucosamine/chondroitin (triple strength) may slow loss of articular cartilage Osteoarthritis glucosamine HCl and chondroitin sulfate  Vitamin D-3 may suppress release of chemicals associated with inflammation Joint and muscle aches and pain associated with arthritis and inflammation CHOLECALCIFEROL; ALPHA.-TOCOPHEROL, D  Moringa anti-inflammatory with mild analgesic effects Joint and muscle aches and pain associated with arthritis and inflammation Many:  Phenolic acids: Gallic acid, ellagic acid, ferulic acid, caffeic acid, o-coumaric acid, and chlorogenic acid. Flavonoids: Rutin, quercetin, rhamnetin, kaempferol, apigenin, and myricetin. Terpenes: Lutein, all-E-luteoxanthin, 13-Z-lutein, 15-Z-?-carotene, and all-E-zeaxanthin. Alkaloids: Marumoside A, marumoside B, and pyrrolemarumine-4?-O-?-L-rhamnopyranoside. Vitamins: Vitamin A and vitamin C. Protein: Milk protein  Melatonin Helps reset sleep cycle. Insomnia. May also be helpful in neurodegenerative disorders melatonin, also known as N-acetyl-5-methoxytryptamine  Vitamin B-12 may help keep nerves and blood cells healthy as well as maintaining function of  nervous system Neuropathies. Nerve pain (Burning pain) methylcobalamin and 5-deoxyadenosylcobalamin  Alpha-Lipoic-Acid (ALA) antioxidant that may help with nerve health, pain, and blocking the activation of some inflammatory chemicals Diabetic neuropathy and metabolic syndrome Alpha-lipoic acid (LA), or 1,2-dithiolane-3-pentanoic acid  superoxide dismutase (SOD) Currently being reviewed.  Superoxide dismutase (SOD); Copper-zinc superoxide dismutase  Tiger Balm Currently being reviewed.  Camphor; Menthol; Capsicum Extract (Capsicin); Methyl Salicylate; Essential Oils (Cassia Oil, Cajuput Oil, Clove Oil, and Dementholized Mint Oil)    ______________________________________________________________________     ______________________________________________________________________    TENS (Device can be purchased "online", without prescription. Search: "TENS 7000".) Transcutaneous electrical nerve stimulation (TENS) is a method of pain relief involving the use of a mild electrical current. A TENS machine is a small, battery-operated device that has leads connected to sticky pads called electrodes.   Rechargeable 9V batteries:     Electrode placement:   TENS UNIT SAFETY WARNING SHEET and INFORMATION INDICATIONS AND CONTRAINDICTIONS Read the operation manual before using the device. Freight forwarder ([place]) restricts this device to sale by or on the order of a physician. Observe your physician's precise instructions and let him show you where to apply the electrodes. For a successful therapy, the correct application of the electrodes is an important factor. Carefully write down the settings your physician recommended. Indications for use This device is a prescription device and only for symptomatic relief of chronic intractable pain. Contraindications:   Any electrode placement that applies current to the carotid sinus (neck) region.   Patients with implanted electronic devices (for example, a pacemaker) or  metallic implants should not undertake.   Any electrode placement that causes current to flow transcerebrally (through the head). The use of unit whenever pain symptoms are undiagnosed, unit etiology is determined.   The use of TENS whenever pain syndromes are undiagnosed, until etiology is established.  WARNINGS AND PRECAUTIONS  Warnings:   The device must be kept out of reach of children.   The safety of device for  use during pregnancy or delivery has not been established.   Do not place electrodes on front of the throat. This may result in spasms of the laryngeal and pharyngeal muscles.   Do not place the electrodes over the carotid nerve (side of neck below ear).   The device is not effective for pain of central origin (headaches).   The device may interfere with electronic monitoring equipment (such as ECG monitors and ECG alarms).   Electrodes should not be placed over the eyes, in the mouth, or internally.   These devices have no curative value.   TENS devices should be used only under the continued supervision of a physician.   TENS is a symptomatic treatment and as such suppresses the sensation of pain which would otherwise serve as a protective mechanism. Precautions/Adverse Reactions   Isolated cases of skin irritation may occur at the site of electrode placement following long-term application.   Stimulation should be stopped and electrodes removed until the cause of the irritation can be determined.   Effectiveness is highly dependent upon patient selection by a person qualified in the management of pain patients.   If the device treatment becomes ineffective or unpleasant, stimulation should be discontinued until reevaluation by a physician/clinician.   Always turn the device off before applying or removing electrodes.   Skin irritation and electrode burns are potential adverse reactions.  PURPOSE: A Transcutaneous Electrical Nerve Stimulator, or TENS, unit is designed to relieve post-operative,  acute and chronic pain. It is used for pain caused by peripheral nerves and not central. TENS units are prescription-only devices.  OPERATION: TENS units work in a couple of ways. The first way they are thought to work is by a method called the [employer]. The [employer] states that our brains can only handle one stimulus at a time. When you have chronic pain, this pain signal is constantly being sent to your brain and recognized as pain. When an electrical stimulus is added to the area of pain the body feels this electrical stimulus, and since the brain can only handle one thing at a time, the pain is not transmitted to the brain. The second method thought to be part of TENS unit's success is by way of stimulating our own bodies to release their own natural painkillers. TENS units do not work for everyone and results may vary. Always follow the instructions and warnings in your user's manual.  USE: One of the most important tasks that must be performed is battery maintenance. If you are using a Engineering geologist, always fully charge it and fully deplete it before charging it again. These batteries can develop memories and by not performing this charging task correctly, your battery's life can be greatly diminished. If your battery does develop a memory you can help expand the memory by charging for 12 - 13 hours and then completely depleting the battery. Always prepare the skin before applying electrodes. Your skin should be clean and free of any lotions or creams. If you are using electrodes that use conductive gel, apply a small, even layer over the electrode. For carbon, self-adhesive electrodes, apply a drop of water to the electrodes before applying to the skin. The electrodes attach to the lead wires and then the TENS unit. Always grasp the connector and not the cord when inserting or removing. When making adjustments, always make sure the unit's channels (1 and 2) are in the OFF  position. The actual settings should be recommended and  prescribed by your physician. Medical equipment suppliers don'tset or instruct users as to user settings. When you are using the BURST mode, the unit delivers a series of quick pulses followed by a rest. This cycle repeats itself frequently. Always have channels OFF before changing modes.  For MODULATION mode, the stimulation automatically varies the width of the pulse.  For CONVENTIONAL mode, the stimulation is constant. After the settings have been fine-tuned, set the timer to 30 or 60 minutes. Your physician should also prescribe the use time. When the lights become dim, it means your batteries should be replaced or recharged.  ACCESSORIES: The electrodes and lead wires can be obtained from your medical equipment supplier. Your medical equipment supplier can set up a recurring delivery to accommodate your needs. Electrodes should be replaced once a month and lead wires once every 6 months.  Video Tutorial [email or web address]/[email or web address]  ______________________________________________________________________

## 2023-04-02 NOTE — Progress Notes (Signed)
PROVIDER NOTE: Information contained herein reflects review and annotations entered in association with encounter. Interpretation of such information and data should be left to medically-trained personnel. Information provided to patient can be located elsewhere in the medical record under "Patient Instructions". Document created using STT-dictation technology, any transcriptional errors that may result from process are unintentional.    Patient: Kendra Benton  Service Category: E/M  Provider: Oswaldo Done, MD  DOB: [birth year]  DOS: 04/02/2023  Referring Provider: Cain Sieve  MRN: [id number]  Specialty: Interventional Pain Management  PCP: Cain Sieve, MD  Type: Established Patient  Setting: Ambulatory outpatient    Location: Office  Delivery: Face-to-face     HPI  [name], a 82 y.o. year old female, is here today because of her Chronic bilateral low back pain without sciatica [M54.50, G89.29]. Ms. Caudle primary complain today is Back Pain  Pertinent problems: Ms. Stirrat has Dupuytren's contracture; Fibromyalgia; History of night sweats; Osteoarthritis; Chronic headache; Atypical chest pain; Carcinoma, lung, right (HCC); Chronic low back pain (1ry area of Pain) (Bilateral) (L>R) w/o sciatica; Mass of upper lobe of right lung; Chronic nonintractable headache; Chronic pain syndrome; Failed back surgical syndrome; Chronic hip pain (2ry area of Pain) (Bilateral) (R>L); Chronic lower extremity pain (Left); Numbness and tingling of both feet (intermittent/recurrent); Pain in rib (Bilateral) (R>L) (intermittent); Chronic knee pain (Left); Patellar pain (Left) (chronic, intermittent); Lumbar facet joint syndrome; Spondylosis without myelopathy or radiculopathy, lumbosacral region; DDD (degenerative disc disease), lumbosacral; Fall (on) (from) other stairs and steps, initial encounter; Lumbar facet joint pain; Compression fracture of L2 lumbar vertebra, sequela;  Pain at implanted Lumbar hardware; and Grade 1 Retrolisthesis of L2/L3 on their pertinent problem list. Pain Assessment: Severity of   is reported as a 4 /10. Location:    /Radiates from lower back into outer left leg causing numbess and can reach to big toe. Onset: More than a month ago. Quality: Constant, Aching, Numbness. Timing: Constant. Modifying factor(s): heating pad, alieve BID, yoga and PT. Vitals:  height is 5\' 2"  (1.575 m) and weight is 130 lb (59 kg). Her temporal temperature is 97.7 F (36.5 C). Her blood pressure is 158/60 (abnormal) and her pulse is 75. Her respiration is 16 and oxygen saturation is 100%.  BMI: Estimated body mass index is 23.78 kg/m as calculated from the following:   Height as of this encounter: 5\' 2"  (1.575 m).   Weight as of this encounter: 130 lb (59 kg). Last encounter: 03/04/2023. Last procedure: 03/19/2023.  Reason for encounter: post-procedure evaluation and assessment. According to the patient she attained 100% relief of the pain for the duration of the local anesthetic unfortunately, once the local anesthetic wore off the pain gradually started to return over the next 3 days or so.  She does indicate improvement and then numbness that she was experiencing in the left foot.  Unfortunately, because she has a significant amount of hardware in the lumbar region, she is not really a good candidate for radiofrequency ablation of the medial branches.  The fact that she has not been able to obtain any long-term benefit with the steroids would suggest the inflammatory process to be minimal and likely to be more mechanical.  Because of the nature of the type of pain that she is experiencing, I brought up the topic of spinal cord stimulators.  The patient reminded me that in the past she had already tried that and it did not seem to work.  I have reminded  her not to completely discard that possibility since that technology has been slowly improving over the years.  As  alternative to that, I have recommended that she try a TENS unit and perhaps some over-the-counter medications.  Today I have provided her with written information regarding these 2 which I have added to her AVS.  The patient had a couple questions regarding daily equalization of marijuana in [place] and although I do not have an opinion or information on that, I did provide her with some information regarding CBD, which is an alternative that is currently readily available.  Unfortunately, for the time being there is not much more that I can offer her and therefore I will continue to see her on a PRN basis.  I have encouraged her to give me a call whenever she has a flareup of the pain which we may be able to help her more at that time.  She understood and accepted.  Post-procedure evaluation   Procedure: Lumbar Facet, Medial Branch Block(s) #2  Laterality: Bilateral  Level: L2, L3, L4, L5, and S1 Medial Branch Level(s). Injecting these levels blocks the L3-4, L4-5, and L5-S1 lumbar facet joints. Imaging: Fluoroscopic guidance         Anesthesia: Local anesthesia (1-2% Lidocaine) Anxiolysis: None                 Sedation: Moderate Sedation                       DOS: 03/19/2023 Performed by: Oswaldo Done, MD  Primary Purpose: Diagnostic/Therapeutic Indications: Low back pain severe enough to impact quality of life or function. 1. Chronic low back pain (1ry area of Pain) (Bilateral) (L>R) w/o sciatica   2. Spondylosis without myelopathy or radiculopathy, lumbosacral region   3. Pain at implanted Lumbar hardware   4. Lumbar facet joint pain   5. Lumbar facet joint syndrome   6. Degeneration of intervertebral disc of lumbosacral region, unspecified whether pain present   7. Failed back surgical syndrome   8. Grade 1 Retrolisthesis of L2/L3   9. Latex precautions, history of latex allergy   10. At high risk for allergic reaction to latex   11. History of allergy to latex     NAS-11 Pain score:   Pre-procedure: 5 /10   Post-procedure: 0-No pain/10      Effectiveness:  Initial hour after procedure: 100 %. Subsequent 4-6 hours post-procedure: 100 %. Analgesia past initial 6 hours: 0 % (Pain gradaully returned after day 3 but numbness down left foot has improved). Ongoing improvement:  Analgesic: According to the patient she attained 100% relief of the pain for the duration of the local anesthetic unfortunately, once the local anesthetic wore off the pain gradually started to return over the next 3 days or so.  She does indicate improvement and then numbness that she was experiencing in the left foot. Function: Transient improvement ROM: Transient improvement  Pharmacotherapy Assessment  Analgesic: No chronic opioid analgesics therapy prescribed by our practice. None MME/day: 0 mg/day   Monitoring: [place] PMP: PDMP reviewed during this encounter.       Pharmacotherapy: No side-effects or adverse reactions reported. Compliance: No problems identified. Effectiveness: Clinically acceptable.  Earlyne Iba, RN  04/02/2023  1:56 PM  Sign when Signing Visit Safety precautions to be maintained throughout the outpatient stay will include: orient to surroundings, keep bed in low position, maintain call bell within reach at all times, provide assistance with  transfer out of bed and ambulation.     No results found for: "CBDTHCR" No results found for: "D8THCCBX" No results found for: "D9THCCBX"  UDS:  Summary  Date Value Ref Range Status  07/03/2020 Note  Final    Comment:    ==================================================================== [employer] Select 13 (MW) ==================================================================== Test                             Result       Flag       Units  Drug Present and Declared for Prescription Verification   Oxazepam                       3005         EXPECTED   ng/mg creat   Temazepam                      >9524         EXPECTED   ng/mg creat    Oxazepam and temazepam are expected metabolites of diazepam.    Oxazepam is also an expected metabolite of other benzodiazepine    drugs, including chlordiazepoxide, prazepam, clorazepate, halazepam,    and temazepam.  Oxazepam and temazepam are available as scheduled    prescription medications.    Tramadol                       20467        EXPECTED   ng/mg creat   O-Desmethyltramadol            8262         EXPECTED   ng/mg creat   N-Desmethyltramadol            6733         EXPECTED   ng/mg creat    Source of tramadol is a prescription medication. O-desmethyltramadol    and N-desmethyltramadol are expected metabolites of tramadol.  ==================================================================== Test                      Result    Flag   Units      Ref Range   Creatinine              21               mg/dL      >=23 ==================================================================== Declared Medications:  The flagging and interpretation on this report are based on the  following declared medications.  Unexpected results may arise from  inaccuracies in the declared medications.   **Note: The testing scope of this panel includes these medications:   Temazepam (Restoril)  Tramadol (Ultram)   **Note: The testing scope of this panel does not include the  following reported medications:   Acetaminophen (Tylenol)  Albuterol  Atorvastatin (Lipitor)  Calcium  Dicyclomine (Bentyl)  Duloxetine (Cymbalta)  Estradiol (Estrace)  Eye Drops  Furosemide (Lasix)  Gabapentin (Neurontin)  Lisinopril (Zestril)  Loperamide (Imodium)  Losartan (Cozaar)  Naproxen (Aleve)  Omeprazole (Prilosec)  Oxcarbazepine (Trileptal)  Potassium (Klor-Con)  Turmeric  Vitamin D ==================================================================== For clinical consultation, please call [phone]. ====================================================================       ROS  Constitutional: Denies any fever or chills Gastrointestinal: No reported hemesis, hematochezia, vomiting, or acute GI distress Musculoskeletal: Denies any acute onset joint swelling, redness, loss of ROM, or weakness Neurological: No reported episodes of acute  onset apraxia, aphasia, dysarthria, agnosia, amnesia, paralysis, loss of coordination, or loss of consciousness  Medication Review  Albuterol, Carboxymethylcellulose Sodium, DULoxetine, OXcarbazepine, Turmeric, acetaminophen, atorvastatin, calcium-vitamin D, dicyclomine, estradiol, furosemide, gabapentin, lisinopril, loperamide, losartan, naproxen sodium, omeprazole, polycarbophil, potassium chloride SA, temazepam, and traMADol  History Review  Allergy: Ms. Butrick is allergic to spironolactone, hydrochlorothiazide, ciprofloxacin, hydralazine, levofloxacin, amlodipine, and latex. Drug: Ms. Hoskin  reports no history of drug use. Alcohol:  reports current alcohol use of about 10.0 standard drinks of alcohol per week. Tobacco:  reports that she quit smoking about 44 years ago. Her smoking use included cigarettes. She started smoking about 64 years ago. She has a 20 pack-year smoking history. She has never used smokeless tobacco. Social: Ms. Donalds  reports that she quit smoking about 44 years ago. Her smoking use included cigarettes. She started smoking about 64 years ago. She has a 20 pack-year smoking history. She has never used smokeless tobacco. She reports current alcohol use of about 10.0 standard drinks of alcohol per week. She reports that she does not use drugs. Medical:  has a past medical history of Allergy, Anxiety, GERD (gastroesophageal reflux disease), Headache, History of kidney stones, Hypertension, Myocardial infarction (HCC), Osteoporosis, and Pneumonia. Surgical: Ms. Wehner  has a past surgical history that includes  Cholecystectomy (N/A, 02/28/2016); Spine surgery; Abdominal hysterectomy; Appendectomy; Lumbar fusion (11/09/2016); Cardiac catheterization; Back surgery; Tonsillectomy; Cataract extraction w/ intraocular lens  implant, bilateral (Bilateral, 2010); Artery Biopsy (Right, 12/30/2017); and Artery Biopsy (Left, 06/18/2018). Family: family history includes Arthritis in her mother; Cancer (age of onset: 32) in her sister; Cancer (age of onset: 78) in her father and mother; Heart disease in her father and mother.  Laboratory Chemistry Profile   Renal Lab Results  Component Value Date   BUN 12 01/11/2021   CREATININE 0.73 01/11/2021   BCR 25 05/16/2020   GFRAA 79 05/16/2020   GFRNONAA >60 01/11/2021    Hepatic Lab Results  Component Value Date   AST 16 05/16/2020   ALT 17 08/27/2018   ALBUMIN 4.1 05/16/2020   ALKPHOS 86 05/16/2020   LIPASE 32 08/27/2018    Electrolytes Lab Results  Component Value Date   NA 130 (L) 01/11/2021   K 2.9 (L) 01/11/2021   CL 92 (L) 01/11/2021   CALCIUM 9.1 01/11/2021   MG 1.7 05/16/2020    Bone Lab Results  Component Value Date   25OHVITD1 35 05/16/2020   25OHVITD2 <1.0 05/16/2020   25OHVITD3 35 05/16/2020    Inflammation (CRP: Acute Phase) (ESR: Chronic Phase) Lab Results  Component Value Date   CRP 1 05/16/2020   ESRSEDRATE 2 05/16/2020   LATICACIDVEN 1.5 02/27/2016         Note: Above Lab results reviewed.  Recent Imaging Review  DG Lumbar Spine Complete W/Bend CLINICAL DATA:  Chronic bilateral low back pain without sciatica. Degenerative disc disease, lumbosacral. Failed back surgical syndrome. Lumbar facet syndrome. Spondylosis without myelopathy or radiculopathy. Lumbar facet joint pain. Compression fracture of L2, sequela. Pain due to other internal prosthetic devices, implants and grafts, sequela.  EXAM: LUMBAR SPINE - COMPLETE WITH BENDING VIEWS  COMPARISON:  Lumbar radiograph 08/07/2020  FINDINGS: There are 5 non-rib-bearing  lumbar vertebra appearance lamellar levo scoliotic curvature centered just above the surgical hardware. L3 through L5 posterior rod and pedicle screw fusion. Interbody spacer at L4-L5. There is no periprosthetic lucency. No abnormal motion on flexion or extension. Chronic L2 compression fracture, grossly stable loss of height from prior. Significant progression in  L2-L3 disc space narrowing, disc space appears obliterated on the current exam. There may be mild L2-L3 facet hypertrophy. No evidence of new lumbar compression fracture. Aortic atherosclerosis.  IMPRESSION: 1. L3 through L5 posterior rod and pedicle screw fusion without hardware complication. 2. Near complete L2-L3 disc space loss, progressed since 2022 exam. Possible mild L2-L3 facet hypertrophy. 3. Chronic L2 compression fracture, grossly stable from prior.  Electronically Signed   By: Narda Rutherford M.D.   On: 03/24/2023 17:51 Note: Reviewed        Physical Exam  General appearance: Well nourished, well developed, and well hydrated. In no apparent acute distress Mental status: Alert, oriented x 3 (person, place, & time)       Respiratory: No evidence of acute respiratory distress Eyes: PERLA Vitals: BP (!) 158/60   Pulse 75   Temp 97.7 F (36.5 C) (Temporal)   Resp 16   Ht 5\' 2"  (1.575 m)   Wt 130 lb (59 kg)   SpO2 100%   BMI 23.78 kg/m  BMI: Estimated body mass index is 23.78 kg/m as calculated from the following:   Height as of this encounter: 5\' 2"  (1.575 m).   Weight as of this encounter: 130 lb (59 kg). Ideal: Ideal body weight: 50.1 kg (110 lb 7.2 oz) Adjusted ideal body weight: 53.6 kg (118 lb 4.3 oz)  Assessment   Diagnosis Status  1. Chronic low back pain (1ry area of Pain) (Bilateral) (L>R) w/o sciatica   2. Pain at implanted Lumbar hardware   3. Lumbar facet joint pain   4. Lumbar facet joint syndrome   5. Postop check    Controlled Controlled Controlled   Updated Problems: No  problems updated.  Plan of Care  Problem-specific:  No problem-specific Assessment & Plan notes found for this encounter.  [name] has a current medication list which includes the following long-term medication(s): albuterol, atorvastatin, calcium-vitamin d, dicyclomine, duloxetine, duloxetine, estradiol, furosemide, losartan, omeprazole, oxcarbazepine, temazepam, lisinopril, and tramadol.  Pharmacotherapy (Medications Ordered): No orders of the defined types were placed in this encounter.  Orders:  Orders Placed This Encounter  Procedures   Nursing Instructions:    Please complete this patient's postprocedure evaluation.    Scheduling Instructions:     Please complete this patient's postprocedure evaluation.   Follow-up plan:   Return if symptoms worsen or fail to improve.      Interventional Therapies  Risk Factors  Considerations  Medical Comorbidities:  Latex Allergy  Advanced age           Planned  Pending:   Therapeutic bilateral lumbar facet (L3-4, L4-5, and L5-S1) MBB (L2-S1) #2    Under consideration:   Pending   Completed:   Diagnostic bilateral lumbar facet (L3-4, L4-5, and L5-S1) MBB (L2-S1) x1 (06/19/2020) (100/100/80/80)    Therapeutic  Palliative (PRN) options:   None established   Completed by other providers:   Therapeutic bilateral SI joint inj. x2 (08/24/2019, 12/16/2019) by Windell Norfolk, MD [facility] PMR)  Therapeutic right lumbar L4/L5 and L5/S1 peri-articular/peri-hardware inj. x1 (03/11/2019) by Windell Norfolk, MD [facility] PMR)  Therapeutic left IA hip inj. x1 (05/14/2018) by Windell Norfolk, MD [facility] PMR)  Therapeutic right lumbar facet (L4, L5, S1) MBB x1 (04/02/2018) by Windell Norfolk, MD [facility] PMR)  Therapeutic right lumbar facet (L5, S1) MBB x1 (02/19/2018) by Windell Norfolk, MD [facility] PMR)  Therapeutic right SI joint inj. x1 (12/11/2017) by Windell Norfolk, MD [facility] PMR)  Therapeutic right hip bursa inj.  x1 (12/11/2017) by Windell Norfolk, MD  [facility] PMR)       Recent Visits Date Type Provider Dept  03/19/23 Procedure visit Delano Metz, MD [facility]  03/04/23 Office Visit Delano Metz, MD [facility]  Showing recent visits within past 90 days and meeting all other requirements Today's Visits Date Type Provider Dept  04/02/23 Office Visit Delano Metz, MD [facility]  Showing today's visits and meeting all other requirements Future Appointments No visits were found meeting these conditions. Showing future appointments within next 90 days and meeting all other requirements  I discussed the assessment and treatment plan with the patient. The patient was provided an opportunity to ask questions and all were answered. The patient agreed with the plan and demonstrated an understanding of the instructions.  Patient advised to call back or seek an in-person evaluation if the symptoms or condition worsens.  Duration of encounter: 35 minutes.  Total time on encounter, as per AMA guidelines included both the face-to-face and non-face-to-face time personally spent by the physician and/or other qualified health care professional(s) on the day of the encounter (includes time in activities that require the physician or other qualified health care professional and does not include time in activities normally performed by clinical staff). Physician's time may include the following activities when performed: Preparing to see the patient (e.g., pre-charting review of records, searching for previously ordered imaging, lab work, and nerve conduction tests) Review of prior analgesic pharmacotherapies. Reviewing PMP Interpreting ordered tests (e.g., lab work, imaging, nerve conduction tests) Performing post-procedure evaluations, including interpretation of diagnostic procedures Obtaining and/or reviewing separately obtained history Performing a medically appropriate examination and/or  evaluation Counseling and educating the patient/family/caregiver Ordering medications, tests, or procedures Referring and communicating with other health care professionals (when not separately reported) Documenting clinical information in the electronic or other health record Independently interpreting results (not separately reported) and communicating results to the patient/ family/caregiver Care coordination (not separately reported)  Note by: Oswaldo Done, MD Date: 04/02/2023; Time: 3:35 PM

## 2023-04-06 ENCOUNTER — Ambulatory Visit: Payer: Medicare HMO

## 2023-04-06 DIAGNOSIS — R2681 Unsteadiness on feet: Secondary | ICD-10-CM

## 2023-04-06 DIAGNOSIS — R278 Other lack of coordination: Secondary | ICD-10-CM

## 2023-04-06 DIAGNOSIS — R2689 Other abnormalities of gait and mobility: Secondary | ICD-10-CM

## 2023-04-06 DIAGNOSIS — G8929 Other chronic pain: Secondary | ICD-10-CM

## 2023-04-06 DIAGNOSIS — R262 Difficulty in walking, not elsewhere classified: Secondary | ICD-10-CM

## 2023-04-06 DIAGNOSIS — R269 Unspecified abnormalities of gait and mobility: Secondary | ICD-10-CM

## 2023-04-06 DIAGNOSIS — M6281 Muscle weakness (generalized): Secondary | ICD-10-CM

## 2023-04-06 NOTE — Therapy (Signed)
OUTPATIENT PHYSICAL THERAPY TREATMENT  Patient Name: Kendra Benton MRN: [id number] DOB:1941-02-05, 82 y.o., female Today's Date: 04/06/2023  END OF SESSION:  PT End of Session - 04/06/23 1324     Visit Number 34    Number of Visits 46   date corrected to reflect most recent recert   Date for PT Re-Evaluation 05/19/23    Authorization Type [employer] primary; [place] medicaid secondary    Authorization Time Period 09/11/22-12/04/22    Progress Note Due on Visit 40    PT Start Time 1317    PT Stop Time 1400    PT Time Calculation (min) 43 min    Equipment Utilized During Treatment Gait belt    Activity Tolerance Patient tolerated treatment well;No increased pain    Behavior During Therapy WFL for tasks assessed/performed                        Past Medical History:  Diagnosis Date   Allergy    Anxiety    GERD (gastroesophageal reflux disease)    Headache    History of kidney stones    Hypertension    Myocardial infarction (HCC)    1997   Osteoporosis    Pneumonia    Past Surgical History:  Procedure Laterality Date   ABDOMINAL HYSTERECTOMY     patient has ovaries   APPENDECTOMY     ARTERY BIOPSY Right 12/30/2017   Procedure: BIOPSY TEMPORAL ARTERY;  Surgeon: Renford Dills, MD;  Location: [facility];  Service: Vascular;  Laterality: Right;   ARTERY BIOPSY Left 06/18/2018   Procedure: BIOPSY TEMPORAL ARTERY;  Surgeon: Renford Dills, MD;  Location: [facility];  Service: Vascular;  Laterality: Left;   BACK SURGERY     CARDIAC CATHETERIZATION     CATARACT EXTRACTION W/ INTRAOCULAR LENS  IMPLANT, BILATERAL Bilateral 2010   CHOLECYSTECTOMY N/A 02/28/2016   Procedure: LAPAROSCOPIC CHOLECYSTECTOMY WITH INTRAOPERATIVE CHOLANGIOGRAM;  Surgeon: Tiney Rouge III, MD;  Location: [facility];  Service: General;  Laterality: N/A;   LUMBAR FUSION  11/09/2016   L3-L5   SPINE SURGERY     Disc   TONSILLECTOMY     Patient Active Problem List   Diagnosis Date Noted    Grade 1 Retrolisthesis of L2/L3 03/19/2023   Latex precautions, history of latex allergy 03/19/2023   At high risk for allergic reaction to latex 03/19/2023   History of allergy to latex 03/19/2023   Lumbar facet joint pain 03/04/2023   Compression fracture of L2 lumbar vertebra, sequela 03/04/2023   Pain at implanted Lumbar hardware 03/04/2023   Fall (on) (from) other stairs and steps, initial encounter 08/06/2020   Lumbar facet joint syndrome 06/18/2020   Spondylosis without myelopathy or radiculopathy, lumbosacral region 06/18/2020   DDD (degenerative disc disease), lumbosacral 06/18/2020   COVID 06/04/2020   Chronic pain syndrome 05/16/2020   Pharmacologic therapy 05/16/2020   Disorder of skeletal system 05/16/2020   Problems influencing health status 05/16/2020   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   Chronic hip pain (2ry area of Pain) (Bilateral) (R>L) 05/16/2020   Chronic lower extremity pain (Left) 05/16/2020   Numbness and tingling of both feet (intermittent/recurrent) 05/16/2020   Pain in rib (Bilateral) (R>L) (intermittent) 05/16/2020   Chronic knee pain (Left) 05/16/2020   Patellar pain (Left) (chronic, intermittent) 05/16/2020   Hard of hearing 05/16/2020   Carcinoma, lung, 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/25/2019   S/P partial lobectomy of lung 02/25/2019   Mass of upper  lobe of right lung 01/06/2019   Chronic nonintractable headache 06/14/2018   Chronic headache 12/03/2017   Sinus bradycardia 10/06/2017   Chronic low back pain (1ry area of Pain) (Bilateral) (L>R) w/o sciatica 08/17/2017   Panic attack as reaction to stress 08/17/2017   Anxiety 06/15/2017   Atypical chest pain 06/15/2017   Hyperlipidemia 06/15/2017   Cholecystitis with cholelithiasis 02/27/2016   Increased frequency of urination 01/27/2015   History of night sweats 05/01/2014   Dupuytren's contracture 07/18/2013   Inverted nipple 04/18/2013   Depression 04/23/2010   Fibromyalgia 04/23/2010    Hypercholesterolemia 04/23/2010   Essential hypertension 04/23/2010   Hypothyroidism 04/23/2010   Irritable colon 04/23/2010   Osteoarthritis 04/23/2010    PCP: Cain Sieve, MD  REFERRING PROVIDER: Cain Sieve, MD  REFERRING DIAG: Chronic low back pain; At risk for falls  Rationale for Evaluation and Treatment: Rehabilitation  THERAPY DIAG:  Muscle weakness (generalized)  Abnormality of gait and mobility  Difficulty in walking, not elsewhere classified  Other lack of coordination  Other abnormalities of gait and mobility  Unsteadiness on feet  Chronic bilateral low back pain without sciatica  ONSET DATE: chronic but worse since Jan 2024  SUBJECTIVE:                                                                                                                                                                                           SUBJECTIVE STATEMENT:  Patient reports back is still  bothering her today. Pt reported 6/10 on NPS in lumbar spine.     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 She has received PT in the past for condition with positive results and currently using pain management strategies including pain meds, Heat, Exercises, Yoga, lidocain patch, diclofenac topical gel, and biofreeze. She was HTN controlled with medication.  She reports still lives alone but having trouble with community and social outings and having to modify how she performs her ADLs and housework due to her chronic pain. PMH- Spondylosis, Scoliosis, HTN, Lung CA.  PAIN:  Are you having pain? Yes 6/10 across low back, midline  PRECAUTIONS: Fall  WEIGHT BEARING RESTRICTIONS: No  FALLS:  Has patient fallen in last 6 months? No- However patient reports some balance issues and near falls- using cane with community outings  PLOF: Independent  PATIENT GOALS: To decrease  my pain  NEXT MD VISIT: unknown   OBJECTIVE:   TODAY'S TREATMENT:  DATE:  04/06/23       Therex:   TA contraction with the following: 3# AW  -Seated knee extension x 20 reps each LE  -Seated hip march x 20 reps each LE   - hip abd up/over 1/2 spike ball x 20 reps  -ham curl with GTB x 20 reps   -Dorsiflex x 20 reps   -Calf raises x 20 reps      CGA from PT for safety for all  standing therex throughout session.     PATIENT EDUCATION:  Education details: PT plan of care; Exercise technique  Person educated: Patient Education method: Explanation, Demonstration, Tactile cues, and Verbal cues Education comprehension: verbalized understanding, returned demonstration, verbal cues required, and tactile cues required  HOME EXERCISE PROGRAM: Access Code: [email or web address] URL: [email or web address] Date: 02/18/2023 Prepared by: Maureen Ralphs  Exercises - Seated Sciatic Tensioner  - 1 x daily - 7 x weekly - 3 sets - 20 hold      11/14/22:  Side step c red band around knees, back against wall   Access Code: [email or web address] URL: [email or web address] Date: 10/13/2022 Prepared by: Maureen Ralphs  Exercises - Standing Quadratus Lumborum Stretch with Doorway  - 1 x daily - 7 x weekly - 3 sets - 20-30 sec hold - Standing Quadratus Lumborum Mobilization with Small Ball on Wall  - 1 x daily - 7 x weekly - 3 sets - 10 reps - Supine Quadratus Lumborum Stretch  - 1 x daily - 7 x weekly - 3 sets - 20-30 hold - 90/90 SI Joint Self-Correction with Dowel  - 1 x daily - 7 x weekly - 3 sets - 10 reps - Hooklying Isometric Hip Abduction with Belt  - 1 x daily - 7 x weekly - 3 sets - 10 reps - 5 hold    Access Code: [email or web address] URL: [email or web address] Date: 10/09/2022 Prepared by: Maureen Ralphs  Exercises - Standing  Quadratus Lumborum Stretch with Doorway  - 1 x daily - 7 x weekly - 3 sets - 20-30 sec hold - Standing Quadratus Lumborum Mobilization with Small Ball on Wall  - 1 x daily - 7 x weekly - 3 sets - 10 reps - Supine Quadratus Lumborum Stretch  - 1 x daily - 7 x weekly - 3 sets - 20-30 hold  ASSESSMENT:  CLINICAL IMPRESSION: Treatment continued to be limited due to patient report of low back pain today. Patient demonstrated good participation with some resistive LE strengthening- exhibiting fatigue as limiting factor vs. Any pain. Unable to progress treatment to dynamic balance today but hope to resume next visit.  Pt will continue to benefit from skilled physical therapy intervention to address impairments, improve QOL, and attain therapy goals.       OBJECTIVE IMPAIRMENTS: Abnormal gait, decreased activity tolerance, decreased balance, decreased endurance, decreased mobility, difficulty walking, decreased strength, and pain.   ACTIVITY LIMITATIONS: carrying, lifting, bending, sitting, standing, squatting, sleeping, stairs, transfers, and bed mobility  PARTICIPATION LIMITATIONS: cleaning, laundry, shopping, community activity, and yard work  PERSONAL FACTORS: Age, Time since onset of injury/illness/exacerbation, and 3+ comorbidities: scoliosis, HTN, degenerative spine  are also affecting patient's functional outcome.   REHAB POTENTIAL: Good  CLINICAL DECISION MAKING: Stable/uncomplicated  EVALUATION COMPLEXITY: Moderate   GOALS: Goals reviewed with patient? Yes  SHORT TERM GOALS: Target date: 10/24/2022  Pt will decrease worst back pain as reported on NPRS by at least 2 points in order to demonstrate clinically significant reduction in back pain.  Baseline: Eval= 8/10;  11/17/2022= 4/10 current and up to 7-8/10 at worst.  Goal status: MET  LONG TERM GOALS: Target date: 02/27/2023  Pt will be independent with Final HEP in order to improve strength and decrease back pain in order to  improve pain-free function at home and work.  Baseline: EVAL= Patient reports needs update and review of exercises to assist with her back pain; 11/17/2022- Patient performing mostly self stretching and yoga based exercises and will continue to benefit from adding to comprehensive HEP; 12/05/2022- HEP ongoing focusing more on Lumbar strengthening; 03/24/2023- patient has made shift to more balance/LE exercises but will benefit from further training and progression of HEP Goal status: ONGOING  2.  Pt will decrease worst back pain as reported on NPRS by at least 3 points in order to demonstrate clinically significant reduction in back pain.  Baseline: EVAL= 8/10; 11/17/2022= 4/10 current and up to 7-8/10 at worst. 12/05/2022= 4/10 current- and up to 6/10 depending on activity 01/12/23: 4/10 and is consistently at this level. 03/24/2023- general low back pain= 4/10.  Goal status: ONGOING  3.  Pt will decrease MODI score by at least 13 points in order demonstrate clinically significant reduction in back pain/disability.   Baseline: EVAL=58%; 11/17/2022= 40%;12/05/2022= 44% 01/12/23:38% Goal status: MET  4.  Pt will improve FOTO to target score of >55 to display perceived improvements in ability to complete ADL's.  Baseline: EVAL= 54; 11/14/22: 56; 12/05/2022=63 Goal status: MET  5.  Patient (> 84 years old) will complete five times sit to stand test in <15 seconds indicating an increased LE strength and improved balance.  Baseline: EVAL = 20.33; 11/17/2022= 16.55 sec without UE Support; 12/05/2022= 16.47 sec without UE support 01/12/23: 15.5 sec; 03/24/2023=14.38 sec without UE support Goal status: MET  6.  Pt will increase by at least 0.13 m/s in order to demonstrate clinically significant improvement in community ambulation.   Baseline: EVAL = 0.97 m/s; 6/10=0.98 m/s; 12/05/2022= 0.98 m/s 8/5:1.08 m/s; 03/24/2023= 0.96 m/s normal and 1.2 m/s (fast)  Goal status: MET  7.  Patient will report ability to  complete > or equal to 60 min grocery store outing or similar community activiities to return to her previous level of function.  Baseline: EVAL: patient reports difficulty tolerating outings in community. 12/05/2022= Patient reports up to around 40 min of continuous community activities.  01/12/23: last time she went she was up for an hour and used cart and had no issues, has some issues with heat but that has been goingn on for 20 years per pt .  Goal status: MET    8. Pt will improve BERG by at least 3 points in order to demonstrate clinically significant improvement in balance.    Baseline: 09/30/2022= 47/56; 12/05/2022= 53/56  Goal status: MET  9. Pt will increase by at least 31m (158ft) in order to demonstrate clinically significant improvement in cardiopulmonary endurance and community ambulation   Baseline: 09/23/2022= use of SPC RUE (pt preference): starting pain 4/10, then 5/10 at 330ft, and 6/10 at 584ft: audible RR dyspnea. Test terminated after 665ft, 3 minutes 15 seconds; 11/17/2022= 920 feet in 5 min 37 sec c/o 4/10 LBP and some DOE- O2 sat unable to read due to cold fingers- Respirations= 23/min; 12/05/2022= 1050 feet in 6 min  01/12/23: 1030 ft Spo2 95% post bout  Goal status: MET   7. Pt will improve FGA by at least 3 points in order to demonstrate clinically significant improvement in balance and decreased  risk for falls.  Baseline: 03/24/2023= 23  Goal status: NEW  8. Patient will transition from skilled PT to gym based program for long term management of strength training.   Baseline: Patient currently involved in skilled PT program for LBP/balance.  Goal status: New  9. Patient will present with improved dynamic standing balance as seen by SLS  consistently > 10 sec for improved dynamic mobility.   Baseline: 03/24/2023= 4-8 sec at bes  Goal status: NEW  PLAN:  PT FREQUENCY: 1-2x/week  PT DURATION: 12 weeks  PLANNED INTERVENTIONS: Therapeutic exercises, Therapeutic  activity, Neuromuscular re-education, Balance training, Gait training, Patient/Family education, Self Care, Joint mobilization, Joint manipulation, Stair training, Vestibular training, Canalith repositioning, Orthotic/Fit training, DME instructions, Dry Needling, Electrical stimulation, Spinal manipulation, Spinal mobilization, Cryotherapy, Moist heat, scar mobilization, Taping, and Manual therapy.  PLAN FOR NEXT SESSION:      Functional LE strengthening and core strength, dynamic balance training.  [employer] gym based instruction next visit    Lenda Kelp, PT 04/06/2023, 5:31 PM   5:31 PM, 04/06/23 Physical Therapist - [facility]  Outpatient Physical Therapy- [facility] [phone]    5:31 PM, 04/06/23

## 2023-04-08 ENCOUNTER — Ambulatory Visit: Payer: Medicare HMO

## 2023-04-13 ENCOUNTER — Ambulatory Visit: Payer: Medicare HMO | Attending: Internal Medicine

## 2023-04-13 DIAGNOSIS — R2689 Other abnormalities of gait and mobility: Secondary | ICD-10-CM | POA: Insufficient documentation

## 2023-04-13 DIAGNOSIS — M6281 Muscle weakness (generalized): Secondary | ICD-10-CM | POA: Insufficient documentation

## 2023-04-13 DIAGNOSIS — R278 Other lack of coordination: Secondary | ICD-10-CM | POA: Insufficient documentation

## 2023-04-13 DIAGNOSIS — R2681 Unsteadiness on feet: Secondary | ICD-10-CM | POA: Diagnosis present

## 2023-04-13 DIAGNOSIS — M545 Low back pain, unspecified: Secondary | ICD-10-CM | POA: Insufficient documentation

## 2023-04-13 DIAGNOSIS — R269 Unspecified abnormalities of gait and mobility: Secondary | ICD-10-CM | POA: Diagnosis present

## 2023-04-13 DIAGNOSIS — G8929 Other chronic pain: Secondary | ICD-10-CM | POA: Diagnosis present

## 2023-04-13 DIAGNOSIS — R262 Difficulty in walking, not elsewhere classified: Secondary | ICD-10-CM | POA: Insufficient documentation

## 2023-04-13 NOTE — Therapy (Signed)
OUTPATIENT PHYSICAL THERAPY TREATMENT  Patient Name: Kendra Benton MRN: [id number] DOB:Feb 07, 1941, 82 y.o., female Today's Date: 04/13/2023  END OF SESSION:  PT End of Session - 04/13/23 1316     Visit Number 35    Number of Visits 46    Date for PT Re-Evaluation 05/19/23    Authorization Type [employer] primary; [employer] [facility] medicaid secondary    Authorization Time Period 09/11/22-12/04/22    Progress Note Due on Visit 40    PT Start Time 1317    PT Stop Time 1358    PT Time Calculation (min) 41 min    Equipment Utilized During Treatment Gait belt    Activity Tolerance Patient tolerated treatment well;No increased pain    Behavior During Therapy WFL for tasks assessed/performed                        Past Medical History:  Diagnosis Date   Allergy    Anxiety    GERD (gastroesophageal reflux disease)    Headache    History of kidney stones    Hypertension    Myocardial infarction (HCC)    1997   Osteoporosis    Pneumonia    Past Surgical History:  Procedure Laterality Date   ABDOMINAL HYSTERECTOMY     patient has ovaries   APPENDECTOMY     ARTERY BIOPSY Right 12/30/2017   Procedure: BIOPSY TEMPORAL ARTERY;  Surgeon: Renford Dills, MD;  Location: [facility];  Service: Vascular;  Laterality: Right;   ARTERY BIOPSY Left 06/18/2018   Procedure: BIOPSY TEMPORAL ARTERY;  Surgeon: Renford Dills, MD;  Location: [facility];  Service: Vascular;  Laterality: Left;   BACK SURGERY     CARDIAC CATHETERIZATION     CATARACT EXTRACTION W/ INTRAOCULAR LENS  IMPLANT, BILATERAL Bilateral 2010   CHOLECYSTECTOMY N/A 02/28/2016   Procedure: LAPAROSCOPIC CHOLECYSTECTOMY WITH INTRAOPERATIVE CHOLANGIOGRAM;  Surgeon: Tiney Rouge III, MD;  Location: [facility];  Service: General;  Laterality: N/A;   LUMBAR FUSION  11/09/2016   L3-L5   SPINE SURGERY     Disc   TONSILLECTOMY     Patient Active Problem List   Diagnosis Date Noted   Grade 1 Retrolisthesis of L2/L3 03/19/2023    Latex precautions, history of latex allergy 03/19/2023   At high risk for allergic reaction to latex 03/19/2023   History of allergy to latex 03/19/2023   Lumbar facet joint pain 03/04/2023   Compression fracture of L2 lumbar vertebra, sequela 03/04/2023   Pain at implanted Lumbar hardware 03/04/2023   Fall (on) (from) other stairs and steps, initial encounter 08/06/2020   Lumbar facet joint syndrome 06/18/2020   Spondylosis without myelopathy or radiculopathy, lumbosacral region 06/18/2020   DDD (degenerative disc disease), lumbosacral 06/18/2020   COVID 06/04/2020   Chronic pain syndrome 05/16/2020   Pharmacologic therapy 05/16/2020   Disorder of skeletal system 05/16/2020   Problems influencing health status 05/16/2020   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   Chronic hip pain (2ry area of Pain) (Bilateral) (R>L) 05/16/2020   Chronic lower extremity pain (Left) 05/16/2020   Numbness and tingling of both feet (intermittent/recurrent) 05/16/2020   Pain in rib (Bilateral) (R>L) (intermittent) 05/16/2020   Chronic knee pain (Left) 05/16/2020   Patellar pain (Left) (chronic, intermittent) 05/16/2020   Hard of hearing 05/16/2020   Carcinoma, lung, 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/25/2019   S/P partial lobectomy of lung 02/25/2019   Mass of upper lobe of right lung 01/06/2019   Chronic  nonintractable headache 06/14/2018   Chronic headache 12/03/2017   Sinus bradycardia 10/06/2017   Chronic low back pain (1ry area of Pain) (Bilateral) (L>R) w/o sciatica 08/17/2017   Panic attack as reaction to stress 08/17/2017   Anxiety 06/15/2017   Atypical chest pain 06/15/2017   Hyperlipidemia 06/15/2017   Cholecystitis with cholelithiasis 02/27/2016   Increased frequency of urination 01/27/2015   History of night sweats 05/01/2014   Dupuytren's contracture 07/18/2013   Inverted nipple 04/18/2013   Depression 04/23/2010   Fibromyalgia 04/23/2010   Hypercholesterolemia 04/23/2010   Essential hypertension  04/23/2010   Hypothyroidism 04/23/2010   Irritable colon 04/23/2010   Osteoarthritis 04/23/2010    PCP: Cain Sieve, MD  REFERRING PROVIDER: Cain Sieve, MD  REFERRING DIAG: Chronic low back pain; At risk for falls  Rationale for Evaluation and Treatment: Rehabilitation  THERAPY DIAG:  Muscle weakness (generalized)  Other lack of coordination  Chronic bilateral low back pain without sciatica  Chronic left-sided low back pain without sciatica  Other abnormalities of gait and mobility  Abnormality of gait and mobility  Difficulty in walking, not elsewhere classified  Unsteadiness on feet  ONSET DATE: chronic but worse since Jan 2024  SUBJECTIVE:                                                                                                                                                                                           SUBJECTIVE STATEMENT:  Pt reported that she is feeling good today. Pt stated that her lower back is still bothering her, but not as bad as usual. Pt reported 5/10 on NPS in lower back.   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 She has received PT in the past for condition with positive results and currently using pain management strategies including pain meds, Heat, Exercises, Yoga, lidocain patch, diclofenac topical gel, and biofreeze. She was HTN controlled with medication.  She reports still lives alone but having trouble with community and social outings and having to modify how she performs her ADLs and housework due to her chronic pain. PMH- Spondylosis, Scoliosis, HTN, Lung CA.  PAIN:  Are you having pain? Yes 6/10 across low back, midline  PRECAUTIONS: Fall  WEIGHT BEARING RESTRICTIONS: No  FALLS:  Has patient fallen in last 6 months? No- However patient reports some balance issues and near falls- using cane with  community outings  PLOF: Independent  PATIENT GOALS: To decrease my pain  NEXT MD VISIT: unknown   OBJECTIVE:   TODAY'S TREATMENT:  DATE:  04/13/23       Therex:   Standing calf raises 2x10 SLS 3x30 sec each LE Seated hamstring curls GTB 2x10 each LE Standing hip abductions 3# AW 2x10 each LE Seated LAQ 3# AW 2x10 each LE Static standing airex no UE Support 3x30 seconds     CGA from PT for safety for all  standing therex throughout session.     PATIENT EDUCATION:  Education details: PT plan of care; Exercise technique  Person educated: Patient Education method: Explanation, Demonstration, Tactile cues, and Verbal cues Education comprehension: verbalized understanding, returned demonstration, verbal cues required, and tactile cues required  HOME EXERCISE PROGRAM: Access Code: [email or web address] URL: [email or web address] Date: 02/18/2023 Prepared by: Maureen Ralphs  Exercises - Seated Sciatic Tensioner  - 1 x daily - 7 x weekly - 3 sets - 20 hold      11/14/22:  Side step c red band around knees, back against wall   Access Code: [email or web address] URL: [email or web address] Date: 10/13/2022 Prepared by: Maureen Ralphs  Exercises - Standing Quadratus Lumborum Stretch with Doorway  - 1 x daily - 7 x weekly - 3 sets - 20-30 sec hold - Standing Quadratus Lumborum Mobilization with Small Ball on Wall  - 1 x daily - 7 x weekly - 3 sets - 10 reps - Supine Quadratus Lumborum Stretch  - 1 x daily - 7 x weekly - 3 sets - 20-30 hold - 90/90 SI Joint Self-Correction with Dowel  - 1 x daily - 7 x weekly - 3 sets - 10 reps - Hooklying Isometric Hip Abduction with Belt  - 1 x daily - 7 x weekly - 3 sets - 10 reps - 5 hold    Access Code: [email or web address] URL: [email or web address] Date: 10/09/2022 Prepared by: Maureen Ralphs  Exercises - Standing Quadratus Lumborum Stretch with Doorway  - 1 x daily - 7 x weekly - 3 sets - 20-30 sec hold - Standing Quadratus Lumborum Mobilization with Small Ball on Wall  - 1 x daily - 7 x weekly - 3 sets - 10 reps - Supine Quadratus Lumborum Stretch  - 1 x daily - 7 x weekly - 3 sets - 20-30 hold  ASSESSMENT:  CLINICAL IMPRESSION: Treatment continued to be limited due to patient report of low back pain today. Pt tolerated all tasks during today's session, despite low back pain. Therapeutic rest breaks were required in between tasks due to low back pain. Pt performed well with balance tasks and stated that she felt that her overall balance is improving. Pt will continue to benefit from skilled physical therapy intervention to address impairments, improve QOL, and attain therapy goals.       OBJECTIVE IMPAIRMENTS: Abnormal gait, decreased activity tolerance, decreased balance, decreased endurance, decreased mobility, difficulty walking, decreased strength, and pain.   ACTIVITY LIMITATIONS: carrying, lifting, bending, sitting, standing, squatting, sleeping, stairs, transfers, and bed mobility  PARTICIPATION LIMITATIONS: cleaning, laundry, shopping, community activity, and yard work  PERSONAL FACTORS: Age, Time since onset of injury/illness/exacerbation, and 3+ comorbidities: scoliosis, HTN, degenerative spine  are also affecting patient's functional outcome.   REHAB POTENTIAL: Good  CLINICAL DECISION MAKING: Stable/uncomplicated  EVALUATION COMPLEXITY: Moderate   GOALS: Goals reviewed with patient? Yes  SHORT TERM GOALS: Target date: 10/24/2022  Pt will decrease worst back pain as reported on NPRS by at least 2 points in order to demonstrate clinically significant reduction in back pain.  Baseline: Eval= 8/10; 11/17/2022= 4/10 current and up to 7-8/10 at worst.  Goal  status: MET  LONG TERM GOALS: Target date: 02/27/2023  Pt will be independent with Final HEP  in order to improve strength and decrease back pain in order to improve pain-free function at home and work.  Baseline: EVAL= Patient reports needs update and review of exercises to assist with her back pain; 11/17/2022- Patient performing mostly self stretching and yoga based exercises and will continue to benefit from adding to comprehensive HEP; 12/05/2022- HEP ongoing focusing more on Lumbar strengthening; 03/24/2023- patient has made shift to more balance/LE exercises but will benefit from further training and progression of HEP Goal status: ONGOING  2.  Pt will decrease worst back pain as reported on NPRS by at least 3 points in order to demonstrate clinically significant reduction in back pain.  Baseline: EVAL= 8/10; 11/17/2022= 4/10 current and up to 7-8/10 at worst. 12/05/2022= 4/10 current- and up to 6/10 depending on activity 01/12/23: 4/10 and is consistently at this level. 03/24/2023- general low back pain= 4/10.  Goal status: ONGOING  3.  Pt will decrease MODI score by at least 13 points in order demonstrate clinically significant reduction in back pain/disability.   Baseline: EVAL=58%; 11/17/2022= 40%;12/05/2022= 44% 01/12/23:38% Goal status: MET  4.  Pt will improve FOTO to target score of >55 to display perceived improvements in ability to complete ADL's.  Baseline: EVAL= 54; 11/14/22: 56; 12/05/2022=63 Goal status: MET  5.  Patient (> 107 years old) will complete five times sit to stand test in <15 seconds indicating an increased LE strength and improved balance.  Baseline: EVAL = 20.33; 11/17/2022= 16.55 sec without UE Support; 12/05/2022= 16.47 sec without UE support 01/12/23: 15.5 sec; 03/24/2023=14.38 sec without UE support Goal status: MET  6.  Pt will increase by at least 0.13 m/s in order to demonstrate clinically significant improvement in community ambulation.   Baseline: EVAL = 0.97 m/s; 6/10=0.98 m/s; 12/05/2022= 0.98 m/s 8/5:1.08 m/s; 03/24/2023= 0.96 m/s normal and 1.2 m/s  (fast)  Goal status: MET  7.  Patient will report ability to complete > or equal to 60 min grocery store outing or similar community activiities to return to her previous level of function.  Baseline: EVAL: patient reports difficulty tolerating outings in community. 12/05/2022= Patient reports up to around 40 min of continuous community activities.  01/12/23: last time she went she was up for an hour and used cart and had no issues, has some issues with heat but that has been goingn on for 20 years per pt .  Goal status: MET    8. Pt will improve BERG by at least 3 points in order to demonstrate clinically significant improvement in balance.    Baseline: 09/30/2022= 47/56; 12/05/2022= 53/56  Goal status: MET  9. Pt will increase by at least 63m (137ft) in order to demonstrate clinically significant improvement in cardiopulmonary endurance and community ambulation   Baseline: 09/23/2022= use of SPC RUE (pt preference): starting pain 4/10, then 5/10 at 335ft, and 6/10 at 532ft: audible RR dyspnea. Test terminated after 643ft, 3 minutes 15 seconds; 11/17/2022= 920 feet in 5 min 37 sec c/o 4/10 LBP and some DOE- O2 sat unable to read due to cold fingers- Respirations= 23/min; 12/05/2022= 1050 feet in 6 min  01/12/23: 1030 ft Spo2 95% post bout  Goal status: MET   7. Pt will improve FGA by at least 3 points in order to demonstrate clinically significant improvement in balance and decreased risk for falls.  Baseline: 03/24/2023= 23  Goal status: NEW  8. Patient will transition from skilled PT to gym based program for long term management of strength training.   Baseline: Patient currently involved in skilled PT program for LBP/balance.  Goal status: New  9. Patient will present with improved dynamic standing balance as seen by SLS  consistently > 10 sec for improved dynamic mobility.   Baseline: 03/24/2023= 4-8 sec at bes  Goal status: NEW  PLAN:  PT FREQUENCY: 1-2x/week  PT DURATION: 12  weeks  PLANNED INTERVENTIONS: Therapeutic exercises, Therapeutic activity, Neuromuscular re-education, Balance training, Gait training, Patient/Family education, Self Care, Joint mobilization, Joint manipulation, Stair training, Vestibular training, Canalith repositioning, Orthotic/Fit training, DME instructions, Dry Needling, Electrical stimulation, Spinal manipulation, Spinal mobilization, Cryotherapy, Moist heat, scar mobilization, Taping, and Manual therapy.  PLAN FOR NEXT SESSION:      Functional LE strengthening and core strength, dynamic balance training.  [employer] gym based instruction next visit    [employer], Student-PT  This entire session was performed under direct supervision and direction of a licensed Estate agent . I have personally read, edited and approve of the note as written.   Louis Meckel, PT 04/13/2023, 2:01 PM   2:01 PM, 04/13/23 Physical Therapist - [facility]  Outpatient Physical [facility]- [facility] [phone]    2:01 PM, 04/13/23

## 2023-04-15 ENCOUNTER — Ambulatory Visit: Payer: Medicare HMO

## 2023-04-15 DIAGNOSIS — M545 Low back pain, unspecified: Secondary | ICD-10-CM

## 2023-04-15 DIAGNOSIS — M6281 Muscle weakness (generalized): Secondary | ICD-10-CM | POA: Diagnosis not present

## 2023-04-15 DIAGNOSIS — R278 Other lack of coordination: Secondary | ICD-10-CM

## 2023-04-15 DIAGNOSIS — R2681 Unsteadiness on feet: Secondary | ICD-10-CM

## 2023-04-15 DIAGNOSIS — R2689 Other abnormalities of gait and mobility: Secondary | ICD-10-CM

## 2023-04-15 DIAGNOSIS — R269 Unspecified abnormalities of gait and mobility: Secondary | ICD-10-CM

## 2023-04-15 DIAGNOSIS — R262 Difficulty in walking, not elsewhere classified: Secondary | ICD-10-CM

## 2023-04-15 DIAGNOSIS — G8929 Other chronic pain: Secondary | ICD-10-CM

## 2023-04-15 NOTE — Therapy (Signed)
OUTPATIENT PHYSICAL THERAPY TREATMENT  Patient Name: Kendra Benton MRN: [id number] DOB:August 21, 1940, 82 y.o., female Today's Date: 04/16/2023  END OF SESSION:  PT End of Session - 04/15/23 1404     Visit Number 36    Number of Visits 46    Date for PT Re-Evaluation 05/19/23    Authorization Type [employer] primary; [place] medicaid secondary    Authorization Time Period 09/11/22-12/04/22    Progress Note Due on Visit 40    PT Start Time 1400    PT Stop Time 1442    PT Time Calculation (min) 42 min    Equipment Utilized During Treatment Gait belt    Activity Tolerance Patient tolerated treatment well;No increased pain    Behavior During Therapy WFL for tasks assessed/performed                        Past Medical History:  Diagnosis Date   Allergy    Anxiety    GERD (gastroesophageal reflux disease)    Headache    History of kidney stones    Hypertension    Myocardial infarction (HCC)    1997   Osteoporosis    Pneumonia    Past Surgical History:  Procedure Laterality Date   ABDOMINAL HYSTERECTOMY     patient has ovaries   APPENDECTOMY     ARTERY BIOPSY Right 12/30/2017   Procedure: BIOPSY TEMPORAL ARTERY;  Surgeon: Renford Dills, MD;  Location: [facility];  Service: Vascular;  Laterality: Right;   ARTERY BIOPSY Left 06/18/2018   Procedure: BIOPSY TEMPORAL ARTERY;  Surgeon: Renford Dills, MD;  Location: [facility];  Service: Vascular;  Laterality: Left;   BACK SURGERY     CARDIAC CATHETERIZATION     CATARACT EXTRACTION W/ INTRAOCULAR LENS  IMPLANT, BILATERAL Bilateral 2010   CHOLECYSTECTOMY N/A 02/28/2016   Procedure: LAPAROSCOPIC CHOLECYSTECTOMY WITH INTRAOPERATIVE CHOLANGIOGRAM;  Surgeon: Tiney Rouge III, MD;  Location: [facility];  Service: General;  Laterality: N/A;   LUMBAR FUSION  11/09/2016   L3-L5   SPINE SURGERY     Disc   TONSILLECTOMY     Patient Active Problem List   Diagnosis Date Noted   Grade 1 Retrolisthesis of L2/L3 03/19/2023    Latex precautions, history of latex allergy 03/19/2023   At high risk for allergic reaction to latex 03/19/2023   History of allergy to latex 03/19/2023   Lumbar facet joint pain 03/04/2023   Compression fracture of L2 lumbar vertebra, sequela 03/04/2023   Pain at implanted Lumbar hardware 03/04/2023   Fall (on) (from) other stairs and steps, initial encounter 08/06/2020   Lumbar facet joint syndrome 06/18/2020   Spondylosis without myelopathy or radiculopathy, lumbosacral region 06/18/2020   DDD (degenerative disc disease), lumbosacral 06/18/2020   COVID 06/04/2020   Chronic pain syndrome 05/16/2020   Pharmacologic therapy 05/16/2020   Disorder of skeletal system 05/16/2020   Problems influencing health status 05/16/2020   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   Chronic hip pain (2ry area of Pain) (Bilateral) (R>L) 05/16/2020   Chronic lower extremity pain (Left) 05/16/2020   Numbness and tingling of both feet (intermittent/recurrent) 05/16/2020   Pain in rib (Bilateral) (R>L) (intermittent) 05/16/2020   Chronic knee pain (Left) 05/16/2020   Patellar pain (Left) (chronic, intermittent) 05/16/2020   Hard of hearing 05/16/2020   Carcinoma, lung, 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/25/2019   S/P partial lobectomy of lung 02/25/2019   Mass of upper lobe of right lung 01/06/2019   Chronic  nonintractable headache 06/14/2018   Chronic headache 12/03/2017   Sinus bradycardia 10/06/2017   Chronic low back pain (1ry area of Pain) (Bilateral) (L>R) w/o sciatica 08/17/2017   Panic attack as reaction to stress 08/17/2017   Anxiety 06/15/2017   Atypical chest pain 06/15/2017   Hyperlipidemia 06/15/2017   Cholecystitis with cholelithiasis 02/27/2016   Increased frequency of urination 01/27/2015   History of night sweats 05/01/2014   Dupuytren's contracture 07/18/2013   Inverted nipple 04/18/2013   Depression 04/23/2010   Fibromyalgia 04/23/2010   Hypercholesterolemia 04/23/2010   Essential hypertension  04/23/2010   Hypothyroidism 04/23/2010   Irritable colon 04/23/2010   Osteoarthritis 04/23/2010    PCP: Cain Sieve, MD  REFERRING PROVIDER: Cain Sieve, MD  REFERRING DIAG: Chronic low back pain; At risk for falls  Rationale for Evaluation and Treatment: Rehabilitation  THERAPY DIAG:  Muscle weakness (generalized)  Other lack of coordination  Chronic bilateral low back pain without sciatica  Chronic left-sided low back pain without sciatica  Other abnormalities of gait and mobility  Abnormality of gait and mobility  Difficulty in walking, not elsewhere classified  Unsteadiness on feet  ONSET DATE: chronic but worse since Jan 2024  SUBJECTIVE:                                                                                                                                                                                           SUBJECTIVE STATEMENT:  Pt reported that she is tired from staying up late last night. Reports pain is about the same as usual.    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 She has received PT in the past for condition with positive results and currently using pain management strategies including pain meds, Heat, Exercises, Yoga, lidocain patch, diclofenac topical gel, and biofreeze. She was HTN controlled with medication.  She reports still lives alone but having trouble with community and social outings and having to modify how she performs her ADLs and housework due to her chronic pain. PMH- Spondylosis, Scoliosis, HTN, Lung CA.  PAIN:  Are you having pain? Yes 6/10 across low back, midline  PRECAUTIONS: Fall  WEIGHT BEARING RESTRICTIONS: No  FALLS:  Has patient fallen in last 6 months? No- However patient reports some balance issues and near falls- using cane with community outings  PLOF: Independent  PATIENT GOALS: To  decrease my pain  NEXT MD VISIT: unknown   OBJECTIVE:   TODAY'S TREATMENT:  DATE:  04/16/23     NMR:   Dynamic lateral step (1 LE on yellow dynadisc and other on floor surface) x 15 reps each Direction Dynamic lateral step (1 LE starts on yellow dynadisc and other on airex pad) x 15 reps each direction Static stand on (1 LE on airex pad positioned directly in front of back leg on yellow dynadisc)  - hold 30 sec x 3- mostly ankle righting reaction Static stand on 1/2 foam (curve side up) EO x 30 sec x 3; EC x 10 sec x 4- Min assist to keep from falling back Static stand on 1/2 foam (curve side down) - EO x 30 sec x 3 (some imbalance with more posterior sway)  SLS up to 30 sec each LE x multiple attempts   Therex:  Standing hip hike (off edge of 1st step with rail support) x 15 reps each Side.  Large amplitude Hip circles - CW and CCW- slow and controlled 2 sets of 5 reps each LE and each direction.         CGA from PT for safety for all  standing therex throughout session.     PATIENT EDUCATION:  Education details: PT plan of care; Exercise technique  Person educated: Patient Education method: Explanation, Demonstration, Tactile cues, and Verbal cues Education comprehension: verbalized understanding, returned demonstration, verbal cues required, and tactile cues required  HOME EXERCISE PROGRAM: Access Code: [email or web address] URL: [email or web address] Date: 02/18/2023 Prepared by: Maureen Ralphs  Exercises - Seated Sciatic Tensioner  - 1 x daily - 7 x weekly - 3 sets - 20 hold      11/14/22:  Side step c red band around knees, back against wall   Access Code: [email or web address] URL: [email or web address] Date: 10/13/2022 Prepared by: Maureen Ralphs  Exercises - Standing Quadratus Lumborum Stretch with Doorway  - 1 x daily  - 7 x weekly - 3 sets - 20-30 sec hold - Standing Quadratus Lumborum Mobilization with Small Ball on Wall  - 1 x daily - 7 x weekly - 3 sets - 10 reps - Supine Quadratus Lumborum Stretch  - 1 x daily - 7 x weekly - 3 sets - 20-30 hold - 90/90 SI Joint Self-Correction with Dowel  - 1 x daily - 7 x weekly - 3 sets - 10 reps - Hooklying Isometric Hip Abduction with Belt  - 1 x daily - 7 x weekly - 3 sets - 10 reps - 5 hold    Access Code: [email or web address] URL: [email or web address] Date: 10/09/2022 Prepared by: Maureen Ralphs  Exercises - Standing Quadratus Lumborum Stretch with Doorway  - 1 x daily - 7 x weekly - 3 sets - 20-30 sec hold - Standing Quadratus Lumborum Mobilization with Small Ball on Wall  - 1 x daily - 7 x weekly - 3 sets - 10 reps - Supine Quadratus Lumborum Stretch  - 1 x daily - 7 x weekly - 3 sets - 20-30 hold  ASSESSMENT:  CLINICAL IMPRESSION: Patient was able to resume some standing balance without report of any dizziness or increased Low back pain. She stated she actually felt better after session than she did coming in. She remains responsive for safe technique and demo some in session progress - adapting to balance activities.  Pt will continue to benefit from skilled physical therapy intervention to address impairments, improve QOL, and attain therapy goals.       OBJECTIVE IMPAIRMENTS: Abnormal gait, decreased activity tolerance, decreased balance, decreased endurance, decreased mobility, difficulty walking,  decreased strength, and pain.   ACTIVITY LIMITATIONS: carrying, lifting, bending, sitting, standing, squatting, sleeping, stairs, transfers, and bed mobility  PARTICIPATION LIMITATIONS: cleaning, laundry, shopping, community activity, and yard work  PERSONAL FACTORS: Age, Time since onset of injury/illness/exacerbation, and 3+ comorbidities: scoliosis, HTN, degenerative spine  are also affecting patient's functional outcome.   REHAB POTENTIAL:  Good  CLINICAL DECISION MAKING: Stable/uncomplicated  EVALUATION COMPLEXITY: Moderate   GOALS: Goals reviewed with patient? Yes  SHORT TERM GOALS: Target date: 10/24/2022  Pt will decrease worst back pain as reported on NPRS by at least 2 points in order to demonstrate clinically significant reduction in back pain.  Baseline: Eval= 8/10; 11/17/2022= 4/10 current and up to 7-8/10 at worst.  Goal status: MET  LONG TERM GOALS: Target date: 02/27/2023  Pt will be independent with Final HEP in order to improve strength and decrease back pain in order to improve pain-free function at home and work.  Baseline: EVAL= Patient reports needs update and review of exercises to assist with her back pain; 11/17/2022- Patient performing mostly self stretching and yoga based exercises and will continue to benefit from adding to comprehensive HEP; 12/05/2022- HEP ongoing focusing more on Lumbar strengthening; 03/24/2023- patient has made shift to more balance/LE exercises but will benefit from further training and progression of HEP Goal status: ONGOING  2.  Pt will decrease worst back pain as reported on NPRS by at least 3 points in order to demonstrate clinically significant reduction in back pain.  Baseline: EVAL= 8/10; 11/17/2022= 4/10 current and up to 7-8/10 at worst. 12/05/2022= 4/10 current- and up to 6/10 depending on activity 01/12/23: 4/10 and is consistently at this level. 03/24/2023- general low back pain= 4/10.  Goal status: ONGOING  3.  Pt will decrease MODI score by at least 13 points in order demonstrate clinically significant reduction in back pain/disability.   Baseline: EVAL=58%; 11/17/2022= 40%;12/05/2022= 44% 01/12/23:38% Goal status: MET  4.  Pt will improve FOTO to target score of >55 to display perceived improvements in ability to complete ADL's.  Baseline: EVAL= 54; 11/14/22: 56; 12/05/2022=63 Goal status: MET  5.  Patient (> 94 years old) will complete five times sit to stand test in <15  seconds indicating an increased LE strength and improved balance.  Baseline: EVAL = 20.33; 11/17/2022= 16.55 sec without UE Support; 12/05/2022= 16.47 sec without UE support 01/12/23: 15.5 sec; 03/24/2023=14.38 sec without UE support Goal status: MET  6.  Pt will increase by at least 0.13 m/s in order to demonstrate clinically significant improvement in community ambulation.   Baseline: EVAL = 0.97 m/s; 6/10=0.98 m/s; 12/05/2022= 0.98 m/s 8/5:1.08 m/s; 03/24/2023= 0.96 m/s normal and 1.2 m/s (fast)  Goal status: MET  7.  Patient will report ability to complete > or equal to 60 min grocery store outing or similar community activiities to return to her previous level of function.  Baseline: EVAL: patient reports difficulty tolerating outings in community. 12/05/2022= Patient reports up to around 40 min of continuous community activities.  01/12/23: last time she went she was up for an hour and used cart and had no issues, has some issues with heat but that has been goingn on for 20 years per pt .  Goal status: MET    8. Pt will improve BERG by at least 3 points in order to demonstrate clinically significant improvement in balance.    Baseline: 09/30/2022= 47/56; 12/05/2022= 53/56  Goal status: MET  9. Pt will increase by at least  53m (150ft) in order to demonstrate clinically significant improvement in cardiopulmonary endurance and community ambulation   Baseline: 09/23/2022= use of SPC RUE (pt preference): starting pain 4/10, then 5/10 at 361ft, and 6/10 at 527ft: audible RR dyspnea. Test terminated after 641ft, 3 minutes 15 seconds; 11/17/2022= 920 feet in 5 min 37 sec c/o 4/10 LBP and some DOE- O2 sat unable to read due to cold fingers- Respirations= 23/min; 12/05/2022= 1050 feet in 6 min  01/12/23: 1030 ft Spo2 95% post bout  Goal status: MET   7. Pt will improve FGA by at least 3 points in order to demonstrate clinically significant improvement in balance and decreased risk for  falls.  Baseline: 03/24/2023= 23  Goal status: NEW  8. Patient will transition from skilled PT to gym based program for long term management of strength training.   Baseline: Patient currently involved in skilled PT program for LBP/balance.  Goal status: New  9. Patient will present with improved dynamic standing balance as seen by SLS  consistently > 10 sec for improved dynamic mobility.   Baseline: 03/24/2023= 4-8 sec at bes  Goal status: NEW  PLAN:  PT FREQUENCY: 1-2x/week  PT DURATION: 12 weeks  PLANNED INTERVENTIONS: Therapeutic exercises, Therapeutic activity, Neuromuscular re-education, Balance training, Gait training, Patient/Family education, Self Care, Joint mobilization, Joint manipulation, Stair training, Vestibular training, Canalith repositioning, Orthotic/Fit training, DME instructions, Dry Needling, Electrical stimulation, Spinal manipulation, Spinal mobilization, Cryotherapy, Moist heat, scar mobilization, Taping, and Manual therapy.  PLAN FOR NEXT SESSION:      Functional LE strengthening and core strength, dynamic balance training.  [employer] gym based instruction next visit if appropriate.    Lenda Kelp, PT  This entire session was performed under direct supervision and direction of a licensed Estate agent . I have personally read, edited and approve of the note as written.   Louis Meckel, PT 04/16/2023, 8:22 AM   8:22 AM, 04/16/23 Physical Therapist - [facility]  Outpatient Physical Therapy- [facility] [phone]    8:22 AM, 04/16/23

## 2023-04-20 ENCOUNTER — Ambulatory Visit: Payer: Medicare HMO

## 2023-04-22 ENCOUNTER — Ambulatory Visit: Payer: Medicare HMO

## 2023-04-27 ENCOUNTER — Ambulatory Visit: Payer: Medicare HMO

## 2023-04-27 DIAGNOSIS — R278 Other lack of coordination: Secondary | ICD-10-CM

## 2023-04-27 DIAGNOSIS — R2689 Other abnormalities of gait and mobility: Secondary | ICD-10-CM

## 2023-04-27 DIAGNOSIS — G8929 Other chronic pain: Secondary | ICD-10-CM

## 2023-04-27 DIAGNOSIS — M6281 Muscle weakness (generalized): Secondary | ICD-10-CM

## 2023-04-27 DIAGNOSIS — M545 Low back pain, unspecified: Secondary | ICD-10-CM

## 2023-04-27 DIAGNOSIS — R262 Difficulty in walking, not elsewhere classified: Secondary | ICD-10-CM

## 2023-04-27 DIAGNOSIS — R269 Unspecified abnormalities of gait and mobility: Secondary | ICD-10-CM

## 2023-04-27 NOTE — Therapy (Signed)
OUTPATIENT PHYSICAL THERAPY TREATMENT  Patient Name: Kendra Benton MRN: [id number] DOB:12/10/1940, 82 y.o., female Today's Date: 04/27/2023  END OF SESSION:  PT End of Session - 04/27/23 1339     Visit Number 37    Number of Visits 46    Date for PT Re-Evaluation 05/19/23    Authorization Type [employer] primary; [place] medicaid secondary    Authorization Time Period 09/11/22-12/04/22    Progress Note Due on Visit 40    PT Start Time 1314    PT Stop Time 1359    PT Time Calculation (min) 45 min    Equipment Utilized During Treatment Gait belt    Activity Tolerance Patient tolerated treatment well;No increased pain    Behavior During Therapy WFL for tasks assessed/performed                         Past Medical History:  Diagnosis Date   Allergy    Anxiety    GERD (gastroesophageal reflux disease)    Headache    History of kidney stones    Hypertension    Myocardial infarction (HCC)    1997   Osteoporosis    Pneumonia    Past Surgical History:  Procedure Laterality Date   ABDOMINAL HYSTERECTOMY     patient has ovaries   APPENDECTOMY     ARTERY BIOPSY Right 12/30/2017   Procedure: BIOPSY TEMPORAL ARTERY;  Surgeon: Renford Dills, MD;  Location: [facility];  Service: Vascular;  Laterality: Right;   ARTERY BIOPSY Left 06/18/2018   Procedure: BIOPSY TEMPORAL ARTERY;  Surgeon: Renford Dills, MD;  Location: [facility];  Service: Vascular;  Laterality: Left;   BACK SURGERY     CARDIAC CATHETERIZATION     CATARACT EXTRACTION W/ INTRAOCULAR LENS  IMPLANT, BILATERAL Bilateral 2010   CHOLECYSTECTOMY N/A 02/28/2016   Procedure: LAPAROSCOPIC CHOLECYSTECTOMY WITH INTRAOPERATIVE CHOLANGIOGRAM;  Surgeon: Tiney Rouge III, MD;  Location: [facility];  Service: General;  Laterality: N/A;   LUMBAR FUSION  11/09/2016   L3-L5   SPINE SURGERY     Disc   TONSILLECTOMY     Patient Active Problem List   Diagnosis Date Noted   Grade 1 Retrolisthesis of L2/L3 03/19/2023    Latex precautions, history of latex allergy 03/19/2023   At high risk for allergic reaction to latex 03/19/2023   History of allergy to latex 03/19/2023   Lumbar facet joint pain 03/04/2023   Compression fracture of L2 lumbar vertebra, sequela 03/04/2023   Pain at implanted Lumbar hardware 03/04/2023   Fall (on) (from) other stairs and steps, initial encounter 08/06/2020   Lumbar facet joint syndrome 06/18/2020   Spondylosis without myelopathy or radiculopathy, lumbosacral region 06/18/2020   DDD (degenerative disc disease), lumbosacral 06/18/2020   COVID 06/04/2020   Chronic pain syndrome 05/16/2020   Pharmacologic therapy 05/16/2020   Disorder of skeletal system 05/16/2020   Problems influencing health status 05/16/2020   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   Chronic hip pain (2ry area of Pain) (Bilateral) (R>L) 05/16/2020   Chronic lower extremity pain (Left) 05/16/2020   Numbness and tingling of both feet (intermittent/recurrent) 05/16/2020   Pain in rib (Bilateral) (R>L) (intermittent) 05/16/2020   Chronic knee pain (Left) 05/16/2020   Patellar pain (Left) (chronic, intermittent) 05/16/2020   Hard of hearing 05/16/2020   Carcinoma, lung, 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/25/2019   S/P partial lobectomy of lung 02/25/2019   Mass of upper lobe of right lung 01/06/2019  Chronic nonintractable headache 06/14/2018   Chronic headache 12/03/2017   Sinus bradycardia 10/06/2017   Chronic low back pain (1ry area of Pain) (Bilateral) (L>R) w/o sciatica 08/17/2017   Panic attack as reaction to stress 08/17/2017   Anxiety 06/15/2017   Atypical chest pain 06/15/2017   Hyperlipidemia 06/15/2017   Cholecystitis with cholelithiasis 02/27/2016   Increased frequency of urination 01/27/2015   History of night sweats 05/01/2014   Dupuytren's contracture 07/18/2013   Inverted nipple 04/18/2013   Depression 04/23/2010   Fibromyalgia 04/23/2010   Hypercholesterolemia 04/23/2010   Essential hypertension  04/23/2010   Hypothyroidism 04/23/2010   Irritable colon 04/23/2010   Osteoarthritis 04/23/2010    PCP: Cain Sieve, MD  REFERRING PROVIDER: Cain Sieve, MD  REFERRING DIAG: Chronic low back pain; At risk for falls  Rationale for Evaluation and Treatment: Rehabilitation  THERAPY DIAG:  Muscle weakness (generalized)  Other lack of coordination  Chronic bilateral low back pain without sciatica  Chronic left-sided low back pain without sciatica  Other abnormalities of gait and mobility  Abnormality of gait and mobility  Difficulty in walking, not elsewhere classified  ONSET DATE: chronic but worse since Jan 2024  SUBJECTIVE:                                                                                                                                                                                           SUBJECTIVE STATEMENT:  Patient reports feeling better after sick all last week.     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 She has received PT in the past for condition with positive results and currently using pain management strategies including pain meds, Heat, Exercises, Yoga, lidocain patch, diclofenac topical gel, and biofreeze. She was HTN controlled with medication.  She reports still lives alone but having trouble with community and social outings and having to modify how she performs her ADLs and housework due to her chronic pain. PMH- Spondylosis, Scoliosis, HTN, Lung CA.  PAIN:  Are you having pain? Yes 6/10 across low back, midline  PRECAUTIONS: Fall  WEIGHT BEARING RESTRICTIONS: No  FALLS:  Has patient fallen in last 6 months? No- However patient reports some balance issues and near falls- using cane with community outings  PLOF: Independent  PATIENT GOALS: To decrease my pain  NEXT MD VISIT: unknown   OBJECTIVE:   TODAY'S  TREATMENT:  DATE:  04/27/23     NMR:   Dynamic lateral step over orange hurdle x 15 reps each Direction Dynamic high knee march without UE support x 20 reps each LE Dynamic forward/backward step over orange hurdle x 15 reps each LE Gait with horizontal head turning in hallway x 60 feet x 4  Blaze pod activity Activity Description: Lateral side stepping (6 pods in line approx 12 in apart)  Activity Setting:  The Blaze Pod Random setting was chosen to enhance cognitive processing and agility, providing an unpredictable environment to simulate real-world scenarios, and fostering quick reactions and adaptability.   Number of Pods:  6 Cycles/Sets:  5 Duration (Time or Hit Count):  10 hit count  Patient Stats  Reaction Time:  1) 55 sec 2) 35 sec 3) 29 sec 4)27.5 sec 5) 28 sec    Static stand on (staggered on airex pad) Eyes open then closed  hold 30 sec x 3- mostly ankle righting reaction        CGA from PT for safety for all  standing therex throughout session.     PATIENT EDUCATION:  Education details: PT plan of care; Exercise technique  Person educated: Patient Education method: Explanation, Demonstration, Tactile cues, and Verbal cues Education comprehension: verbalized understanding, returned demonstration, verbal cues required, and tactile cues required  HOME EXERCISE PROGRAM: Access Code: [email or web address] URL: [email or web address] Date: 02/18/2023 Prepared by: Maureen Ralphs  Exercises - Seated Sciatic Tensioner  - 1 x daily - 7 x weekly - 3 sets - 20 hold      11/14/22:  Side step c red band around knees, back against wall   Access Code: [email or web address] URL: [email or web address] Date: 10/13/2022 Prepared by: Maureen Ralphs  Exercises - Standing Quadratus Lumborum Stretch with Doorway  - 1 x daily - 7 x weekly  - 3 sets - 20-30 sec hold - Standing Quadratus Lumborum Mobilization with Small Ball on Wall  - 1 x daily - 7 x weekly - 3 sets - 10 reps - Supine Quadratus Lumborum Stretch  - 1 x daily - 7 x weekly - 3 sets - 20-30 hold - 90/90 SI Joint Self-Correction with Dowel  - 1 x daily - 7 x weekly - 3 sets - 10 reps - Hooklying Isometric Hip Abduction with Belt  - 1 x daily - 7 x weekly - 3 sets - 10 reps - 5 hold    Access Code: [email or web address] URL: [email or web address] Date: 10/09/2022 Prepared by: Maureen Ralphs  Exercises - Standing Quadratus Lumborum Stretch with Doorway  - 1 x daily - 7 x weekly - 3 sets - 20-30 sec hold - Standing Quadratus Lumborum Mobilization with Small Ball on Wall  - 1 x daily - 7 x weekly - 3 sets - 10 reps - Supine Quadratus Lumborum Stretch  - 1 x daily - 7 x weekly - 3 sets - 20-30 hold  ASSESSMENT:  CLINICAL IMPRESSION: Patient challenged with all activities- presents with some unsteadiness with single leg weight bearing and later with blaze pods- did adapt well improving with reaction time as she adapted to activity. Pt will continue to benefit from skilled physical therapy intervention to address impairments, improve QOL, and attain therapy goals.       OBJECTIVE IMPAIRMENTS: Abnormal gait, decreased activity tolerance, decreased balance, decreased endurance, decreased mobility, difficulty walking, decreased strength, and pain.   ACTIVITY LIMITATIONS: carrying, lifting, bending, sitting, standing, squatting, sleeping, stairs, transfers, and bed mobility  PARTICIPATION LIMITATIONS: cleaning, laundry, shopping, community activity,  and yard work  PERSONAL FACTORS: Age, Time since onset of injury/illness/exacerbation, and 3+ comorbidities: scoliosis, HTN, degenerative spine  are also affecting patient's functional outcome.   REHAB POTENTIAL: Good  CLINICAL DECISION MAKING: Stable/uncomplicated  EVALUATION COMPLEXITY:  Moderate   GOALS: Goals reviewed with patient? Yes  SHORT TERM GOALS: Target date: 10/24/2022  Pt will decrease worst back pain as reported on NPRS by at least 2 points in order to demonstrate clinically significant reduction in back pain.  Baseline: Eval= 8/10; 11/17/2022= 4/10 current and up to 7-8/10 at worst.  Goal status: MET  LONG TERM GOALS: Target date: 02/27/2023  Pt will be independent with Final HEP in order to improve strength and decrease back pain in order to improve pain-free function at home and work.  Baseline: EVAL= Patient reports needs update and review of exercises to assist with her back pain; 11/17/2022- Patient performing mostly self stretching and yoga based exercises and will continue to benefit from adding to comprehensive HEP; 12/05/2022- HEP ongoing focusing more on Lumbar strengthening; 03/24/2023- patient has made shift to more balance/LE exercises but will benefit from further training and progression of HEP Goal status: ONGOING  2.  Pt will decrease worst back pain as reported on NPRS by at least 3 points in order to demonstrate clinically significant reduction in back pain.  Baseline: EVAL= 8/10; 11/17/2022= 4/10 current and up to 7-8/10 at worst. 12/05/2022= 4/10 current- and up to 6/10 depending on activity 01/12/23: 4/10 and is consistently at this level. 03/24/2023- general low back pain= 4/10.  Goal status: ONGOING  3.  Pt will decrease MODI score by at least 13 points in order demonstrate clinically significant reduction in back pain/disability.   Baseline: EVAL=58%; 11/17/2022= 40%;12/05/2022= 44% 01/12/23:38% Goal status: MET  4.  Pt will improve FOTO to target score of >55 to display perceived improvements in ability to complete ADL's.  Baseline: EVAL= 54; 11/14/22: 56; 12/05/2022=63 Goal status: MET  5.  Patient (> 82 years old) will complete five times sit to stand test in <15 seconds indicating an increased LE strength and improved balance.  Baseline: EVAL  = 20.33; 11/17/2022= 16.55 sec without UE Support; 12/05/2022= 16.47 sec without UE support 01/12/23: 15.5 sec; 03/24/2023=14.38 sec without UE support Goal status: MET  6.  Pt will increase by at least 0.13 m/s in order to demonstrate clinically significant improvement in community ambulation.   Baseline: EVAL = 0.97 m/s; 6/10=0.98 m/s; 12/05/2022= 0.98 m/s 8/5:1.08 m/s; 03/24/2023= 0.96 m/s normal and 1.2 m/s (fast)  Goal status: MET  7.  Patient will report ability to complete > or equal to 60 min grocery store outing or similar community activiities to return to her previous level of function.  Baseline: EVAL: patient reports difficulty tolerating outings in community. 12/05/2022= Patient reports up to around 40 min of continuous community activities.  01/12/23: last time she went she was up for an hour and used cart and had no issues, has some issues with heat but that has been goingn on for 20 years per pt .  Goal status: MET    8. Pt will improve BERG by at least 3 points in order to demonstrate clinically significant improvement in balance.    Baseline: 09/30/2022= 47/56; 12/05/2022= 53/56  Goal status: MET  9. Pt will increase by at least 71m (113ft) in order to demonstrate clinically significant improvement in cardiopulmonary endurance and community ambulation   Baseline: 09/23/2022= use of SPC RUE (pt preference): starting pain 4/10,  then 5/10 at 34ft, and 6/10 at 566ft: audible RR dyspnea. Test terminated after 651ft, 3 minutes 15 seconds; 11/17/2022= 920 feet in 5 min 37 sec c/o 4/10 LBP and some DOE- O2 sat unable to read due to cold fingers- Respirations= 23/min; 12/05/2022= 1050 feet in 6 min  01/12/23: 1030 ft Spo2 95% post bout  Goal status: MET   7. Pt will improve FGA by at least 3 points in order to demonstrate clinically significant improvement in balance and decreased risk for falls.  Baseline: 03/24/2023= 23  Goal status: NEW  8. Patient will transition from skilled PT  to gym based program for long term management of strength training.   Baseline: Patient currently involved in skilled PT program for LBP/balance.  Goal status: New  9. Patient will present with improved dynamic standing balance as seen by SLS  consistently > 10 sec for improved dynamic mobility.   Baseline: 03/24/2023= 4-8 sec at bes  Goal status: NEW  PLAN:  PT FREQUENCY: 1-2x/week  PT DURATION: 12 weeks  PLANNED INTERVENTIONS: Therapeutic exercises, Therapeutic activity, Neuromuscular re-education, Balance training, Gait training, Patient/Family education, Self Care, Joint mobilization, Joint manipulation, Stair training, Vestibular training, Canalith repositioning, Orthotic/Fit training, DME instructions, Dry Needling, Electrical stimulation, Spinal manipulation, Spinal mobilization, Cryotherapy, Moist heat, scar mobilization, Taping, and Manual therapy.  PLAN FOR NEXT SESSION:      Functional LE strengthening and core strength, dynamic balance training.  [employer] gym based instruction next visit if appropriate.    Lenda Kelp, PT    Louis Meckel, PT 04/27/2023, 3:10 PM   3:10 PM, 04/27/23 Physical Therapist - [facility]  Outpatient Physical Therapy- Main [facility] [phone]    3:10 PM, 04/27/23

## 2023-04-29 ENCOUNTER — Ambulatory Visit: Payer: Medicare HMO

## 2023-04-29 DIAGNOSIS — G8929 Other chronic pain: Secondary | ICD-10-CM

## 2023-04-29 DIAGNOSIS — M6281 Muscle weakness (generalized): Secondary | ICD-10-CM

## 2023-04-29 DIAGNOSIS — R278 Other lack of coordination: Secondary | ICD-10-CM

## 2023-04-29 NOTE — Therapy (Signed)
OUTPATIENT PHYSICAL THERAPY TREATMENT  Patient Name: Kendra Benton MRN: [id number] DOB:07-11-1940, 82 y.o., female Today's Date: 04/29/2023  END OF SESSION:  PT End of Session - 04/29/23 1414     Visit Number 37    Number of Visits 46    Date for PT Re-Evaluation 05/19/23    Authorization Type [employer] primary; [place] medicaid secondary    Authorization Time Period 09/11/22-12/04/22    Progress Note Due on Visit 40    PT Start Time 1405    PT Stop Time 1447    PT Time Calculation (min) 42 min    Equipment Utilized During Treatment Gait belt    Activity Tolerance Patient tolerated treatment well;No increased pain    Behavior During Therapy WFL for tasks assessed/performed                          Past Medical History:  Diagnosis Date   Allergy    Anxiety    GERD (gastroesophageal reflux disease)    Headache    History of kidney stones    Hypertension    Myocardial infarction (HCC)    1997   Osteoporosis    Pneumonia    Past Surgical History:  Procedure Laterality Date   ABDOMINAL HYSTERECTOMY     patient has ovaries   APPENDECTOMY     ARTERY BIOPSY Right 12/30/2017   Procedure: BIOPSY TEMPORAL ARTERY;  Surgeon: Renford Dills, MD;  Location: [facility];  Service: Vascular;  Laterality: Right;   ARTERY BIOPSY Left 06/18/2018   Procedure: BIOPSY TEMPORAL ARTERY;  Surgeon: Renford Dills, MD;  Location: [facility];  Service: Vascular;  Laterality: Left;   BACK SURGERY     CARDIAC CATHETERIZATION     CATARACT EXTRACTION W/ INTRAOCULAR LENS  IMPLANT, BILATERAL Bilateral 2010   CHOLECYSTECTOMY N/A 02/28/2016   Procedure: LAPAROSCOPIC CHOLECYSTECTOMY WITH INTRAOPERATIVE CHOLANGIOGRAM;  Surgeon: Tiney Rouge III, MD;  Location: [facility];  Service: General;  Laterality: N/A;   LUMBAR FUSION  11/09/2016   L3-L5   SPINE SURGERY     Disc   TONSILLECTOMY     Patient Active Problem List   Diagnosis Date Noted   Grade 1 Retrolisthesis of L2/L3 03/19/2023    Latex precautions, history of latex allergy 03/19/2023   At high risk for allergic reaction to latex 03/19/2023   History of allergy to latex 03/19/2023   Lumbar facet joint pain 03/04/2023   Compression fracture of L2 lumbar vertebra, sequela 03/04/2023   Pain at implanted Lumbar hardware 03/04/2023   Fall (on) (from) other stairs and steps, initial encounter 08/06/2020   Lumbar facet joint syndrome 06/18/2020   Spondylosis without myelopathy or radiculopathy, lumbosacral region 06/18/2020   DDD (degenerative disc disease), lumbosacral 06/18/2020   COVID 06/04/2020   Chronic pain syndrome 05/16/2020   Pharmacologic therapy 05/16/2020   Disorder of skeletal system 05/16/2020   Problems influencing health status 05/16/2020   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   Chronic hip pain (2ry area of Pain) (Bilateral) (R>L) 05/16/2020   Chronic lower extremity pain (Left) 05/16/2020   Numbness and tingling of both feet (intermittent/recurrent) 05/16/2020   Pain in rib (Bilateral) (R>L) (intermittent) 05/16/2020   Chronic knee pain (Left) 05/16/2020   Patellar pain (Left) (chronic, intermittent) 05/16/2020   Hard of hearing 05/16/2020   Carcinoma, lung, 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/25/2019   S/P partial lobectomy of lung 02/25/2019   Mass of upper lobe of right lung 01/06/2019  Chronic nonintractable headache 06/14/2018   Chronic headache 12/03/2017   Sinus bradycardia 10/06/2017   Chronic low back pain (1ry area of Pain) (Bilateral) (L>R) w/o sciatica 08/17/2017   Panic attack as reaction to stress 08/17/2017   Anxiety 06/15/2017   Atypical chest pain 06/15/2017   Hyperlipidemia 06/15/2017   Cholecystitis with cholelithiasis 02/27/2016   Increased frequency of urination 01/27/2015   History of night sweats 05/01/2014   Dupuytren's contracture 07/18/2013   Inverted nipple 04/18/2013   Depression 04/23/2010   Fibromyalgia 04/23/2010   Hypercholesterolemia 04/23/2010   Essential hypertension  04/23/2010   Hypothyroidism 04/23/2010   Irritable colon 04/23/2010   Osteoarthritis 04/23/2010    PCP: Cain Sieve, MD  REFERRING PROVIDER: Cain Sieve, MD  REFERRING DIAG: Chronic low back pain; At risk for falls  Rationale for Evaluation and Treatment: Rehabilitation  THERAPY DIAG:  Muscle weakness (generalized)  Other lack of coordination  Chronic bilateral low back pain without sciatica  ONSET DATE: chronic but worse since Jan 2024  SUBJECTIVE:                                                                                                                                                                                           SUBJECTIVE STATEMENT:  Patient reports low back is flared up - not sure of cause.  Rates at 7/10 low back pain today.    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 She has received PT in the past for condition with positive results and currently using pain management strategies including pain meds, Heat, Exercises, Yoga, lidocain patch, diclofenac topical gel, and biofreeze. She was HTN controlled with medication.  She reports still lives alone but having trouble with community and social outings and having to modify how she performs her ADLs and housework due to her chronic pain. PMH- Spondylosis, Scoliosis, HTN, Lung CA.  PAIN:  Are you having pain? Yes 6/10 across low back, midline  PRECAUTIONS: Fall  WEIGHT BEARING RESTRICTIONS: No  FALLS:  Has patient fallen in last 6 months? No- However patient reports some balance issues and near falls- using cane with community outings  PLOF: Independent  PATIENT GOALS: To decrease my pain  NEXT MD VISIT: unknown   OBJECTIVE:   TODAY'S TREATMENT:  DATE:   04/29/23     THEREX- Instruction in wellzone equipment for upcoming discharge to gym/home based program.  Ham curl - Level 2 - 3 sets of 10 reps Knee ext - Level 2 - 3 sets of 10 reps Calf press - Level 3- 3 sets of 10 reps  Leg press- Level 3 - 3 sets of 10 reps (patient reported mild tightness in Low back yet no increased pain)  Standing side bending with 5# KB 2 sets of 10 reps each side Lateral side step up/over KB with BUE support 2 sets of 10 reps each       CGA from PT for safety for all  standing therex throughout session.     PATIENT EDUCATION:  Education details: PT plan of care; Exercise technique  Person educated: Patient Education method: Explanation, Demonstration, Tactile cues, and Verbal cues Education comprehension: verbalized understanding, returned demonstration, verbal cues required, and tactile cues required  HOME EXERCISE PROGRAM: Access Code: [email or web address] URL: [email or web address] Date: 02/18/2023 Prepared by: Maureen Ralphs  Exercises - Seated Sciatic Tensioner  - 1 x daily - 7 x weekly - 3 sets - 20 hold      11/14/22:  Side step c red band around knees, back against wall   Access Code: [email or web address] URL: [email or web address] Date: 10/13/2022 Prepared by: Maureen Ralphs  Exercises - Standing Quadratus Lumborum Stretch with Doorway  - 1 x daily - 7 x weekly - 3 sets - 20-30 sec hold - Standing Quadratus Lumborum Mobilization with Small Ball on Wall  - 1 x daily - 7 x weekly - 3 sets - 10 reps - Supine Quadratus Lumborum Stretch  - 1 x daily - 7 x weekly - 3 sets - 20-30 hold - 90/90 SI Joint Self-Correction with Dowel  - 1 x daily - 7 x weekly - 3 sets - 10 reps - Hooklying Isometric Hip Abduction with Belt  - 1 x daily - 7 x weekly - 3 sets - 10 reps - 5 hold    Access Code: [email or web address] URL: [email or web address] Date: 10/09/2022 Prepared by: Maureen Ralphs  Exercises - Standing Quadratus  Lumborum Stretch with Doorway  - 1 x daily - 7 x weekly - 3 sets - 20-30 sec hold - Standing Quadratus Lumborum Mobilization with Small Ball on Wall  - 1 x daily - 7 x weekly - 3 sets - 10 reps - Supine Quadratus Lumborum Stretch  - 1 x daily - 7 x weekly - 3 sets - 20-30 hold  ASSESSMENT:  CLINICAL IMPRESSION: Patient presents initially with increased overall low back pain. Treatment focused on some LE strengthening in gym based setting to prepare for upcoming PT discharge. Patient described no low back pain with all activities except last set of leg press- stated some low back "tightening." She presented with overall LE muscle weakness and fatigue yet able to follow all VC and visual cues for proper demonstration of activities. Pt will continue to benefit from skilled physical therapy intervention to address impairments, improve QOL, and attain therapy goals.       OBJECTIVE IMPAIRMENTS: Abnormal gait, decreased activity tolerance, decreased balance, decreased endurance, decreased mobility, difficulty walking, decreased strength, and pain.   ACTIVITY LIMITATIONS: carrying, lifting, bending, sitting, standing, squatting, sleeping, stairs, transfers, and bed mobility  PARTICIPATION LIMITATIONS: cleaning, laundry, shopping, community activity, and yard work  PERSONAL FACTORS: Age, Time since onset of injury/illness/exacerbation, and 3+ comorbidities: scoliosis, HTN, degenerative spine  are also affecting patient's functional outcome.   REHAB  POTENTIAL: Good  CLINICAL DECISION MAKING: Stable/uncomplicated  EVALUATION COMPLEXITY: Moderate   GOALS: Goals reviewed with patient? Yes  SHORT TERM GOALS: Target date: 10/24/2022  Pt will decrease worst back pain as reported on NPRS by at least 2 points in order to demonstrate clinically significant reduction in back pain.  Baseline: Eval= 8/10; 11/17/2022= 4/10 current and up to 7-8/10 at worst.  Goal status: MET  LONG TERM GOALS: Target  date: 02/27/2023  Pt will be independent with Final HEP in order to improve strength and decrease back pain in order to improve pain-free function at home and work.  Baseline: EVAL= Patient reports needs update and review of exercises to assist with her back pain; 11/17/2022- Patient performing mostly self stretching and yoga based exercises and will continue to benefit from adding to comprehensive HEP; 12/05/2022- HEP ongoing focusing more on Lumbar strengthening; 03/24/2023- patient has made shift to more balance/LE exercises but will benefit from further training and progression of HEP Goal status: ONGOING  2.  Pt will decrease worst back pain as reported on NPRS by at least 3 points in order to demonstrate clinically significant reduction in back pain.  Baseline: EVAL= 8/10; 11/17/2022= 4/10 current and up to 7-8/10 at worst. 12/05/2022= 4/10 current- and up to 6/10 depending on activity 01/12/23: 4/10 and is consistently at this level. 03/24/2023- general low back pain= 4/10.  Goal status: ONGOING  3.  Pt will decrease MODI score by at least 13 points in order demonstrate clinically significant reduction in back pain/disability.   Baseline: EVAL=58%; 11/17/2022= 40%;12/05/2022= 44% 01/12/23:38% Goal status: MET  4.  Pt will improve FOTO to target score of >55 to display perceived improvements in ability to complete ADL's.  Baseline: EVAL= 54; 11/14/22: 56; 12/05/2022=63 Goal status: MET  5.  Patient (> 61 years old) will complete five times sit to stand test in <15 seconds indicating an increased LE strength and improved balance.  Baseline: EVAL = 20.33; 11/17/2022= 16.55 sec without UE Support; 12/05/2022= 16.47 sec without UE support 01/12/23: 15.5 sec; 03/24/2023=14.38 sec without UE support Goal status: MET  6.  Pt will increase by at least 0.13 m/s in order to demonstrate clinically significant improvement in community ambulation.   Baseline: EVAL = 0.97 m/s; 6/10=0.98 m/s; 12/05/2022= 0.98 m/s  8/5:1.08 m/s; 03/24/2023= 0.96 m/s normal and 1.2 m/s (fast)  Goal status: MET  7.  Patient will report ability to complete > or equal to 60 min grocery store outing or similar community activiities to return to her previous level of function.  Baseline: EVAL: patient reports difficulty tolerating outings in community. 12/05/2022= Patient reports up to around 40 min of continuous community activities.  01/12/23: last time she went she was up for an hour and used cart and had no issues, has some issues with heat but that has been goingn on for 20 years per pt .  Goal status: MET    8. Pt will improve BERG by at least 3 points in order to demonstrate clinically significant improvement in balance.    Baseline: 09/30/2022= 47/56; 12/05/2022= 53/56  Goal status: MET  9. Pt will increase by at least 10m (128ft) in order to demonstrate clinically significant improvement in cardiopulmonary endurance and community ambulation   Baseline: 09/23/2022= use of SPC RUE (pt preference): starting pain 4/10, then 5/10 at 361ft, and 6/10 at 545ft: audible RR dyspnea. Test terminated after 626ft, 3 minutes 15 seconds; 11/17/2022= 920 feet in 5 min 37 sec c/o 4/10  LBP and some DOE- O2 sat unable to read due to cold fingers- Respirations= 23/min; 12/05/2022= 1050 feet in 6 min  01/12/23: 1030 ft Spo2 95% post bout  Goal status: MET   7. Pt will improve FGA by at least 3 points in order to demonstrate clinically significant improvement in balance and decreased risk for falls.  Baseline: 03/24/2023= 23  Goal status: NEW  8. Patient will transition from skilled PT to gym based program for long term management of strength training.   Baseline: Patient currently involved in skilled PT program for LBP/balance.  Goal status: New  9. Patient will present with improved dynamic standing balance as seen by SLS  consistently > 10 sec for improved dynamic mobility.   Baseline: 03/24/2023= 4-8 sec at bes  Goal status: NEW   PLAN:  PT FREQUENCY: 1-2x/week  PT DURATION: 12 weeks  PLANNED INTERVENTIONS: Therapeutic exercises, Therapeutic activity, Neuromuscular re-education, Balance training, Gait training, Patient/Family education, Self Care, Joint mobilization, Joint manipulation, Stair training, Vestibular training, Canalith repositioning, Orthotic/Fit training, DME instructions, Dry Needling, Electrical stimulation, Spinal manipulation, Spinal mobilization, Cryotherapy, Moist heat, scar mobilization, Taping, and Manual therapy.  PLAN FOR NEXT SESSION:      Functional LE strengthening and core strength, dynamic balance training.  Wellzone gym based instruction next visit if appropriate.    Lenda Kelp, PT    Louis Meckel, PT 04/29/2023, 2:55 PM   2:55 PM, 04/29/23 Physical Therapist - [facility]  Outpatient Physical Therapy- Main Campus [phone]    2:55 PM, 04/29/23

## 2023-05-04 ENCOUNTER — Ambulatory Visit: Payer: Medicare HMO

## 2023-05-04 DIAGNOSIS — M545 Low back pain, unspecified: Secondary | ICD-10-CM

## 2023-05-04 DIAGNOSIS — R2681 Unsteadiness on feet: Secondary | ICD-10-CM

## 2023-05-04 DIAGNOSIS — R262 Difficulty in walking, not elsewhere classified: Secondary | ICD-10-CM

## 2023-05-04 DIAGNOSIS — R278 Other lack of coordination: Secondary | ICD-10-CM

## 2023-05-04 DIAGNOSIS — M6281 Muscle weakness (generalized): Secondary | ICD-10-CM

## 2023-05-04 DIAGNOSIS — R269 Unspecified abnormalities of gait and mobility: Secondary | ICD-10-CM

## 2023-05-04 DIAGNOSIS — R2689 Other abnormalities of gait and mobility: Secondary | ICD-10-CM

## 2023-05-04 NOTE — Therapy (Signed)
OUTPATIENT PHYSICAL THERAPY TREATMENT  Patient Name: Kendra Benton MRN: [id number] DOB:1941/05/10, 82 y.o., female Today's Date: 05/04/2023  END OF SESSION:  PT End of Session - 05/04/23 1325     Visit Number 39    Number of Visits 46    Date for PT Re-Evaluation 05/19/23    Authorization Type [employer] primary; [place] medicaid secondary    Authorization Time Period 09/11/22-12/04/22    Progress Note Due on Visit 40    PT Start Time 1316    PT Stop Time 1359    PT Time Calculation (min) 43 min    Equipment Utilized During Treatment Gait belt    Activity Tolerance Patient tolerated treatment well;No increased pain    Behavior During Therapy WFL for tasks assessed/performed                           Past Medical History:  Diagnosis Date   Allergy    Anxiety    GERD (gastroesophageal reflux disease)    Headache    History of kidney stones    Hypertension    Myocardial infarction (HCC)    1997   Osteoporosis    Pneumonia    Past Surgical History:  Procedure Laterality Date   ABDOMINAL HYSTERECTOMY     patient has ovaries   APPENDECTOMY     ARTERY BIOPSY Right 12/30/2017   Procedure: BIOPSY TEMPORAL ARTERY;  Surgeon: Renford Dills, MD;  Location: [facility];  Service: Vascular;  Laterality: Right;   ARTERY BIOPSY Left 06/18/2018   Procedure: BIOPSY TEMPORAL ARTERY;  Surgeon: Renford Dills, MD;  Location: [facility];  Service: Vascular;  Laterality: Left;   BACK SURGERY     CARDIAC CATHETERIZATION     CATARACT EXTRACTION W/ INTRAOCULAR LENS  IMPLANT, BILATERAL Bilateral 2010   CHOLECYSTECTOMY N/A 02/28/2016   Procedure: LAPAROSCOPIC CHOLECYSTECTOMY WITH INTRAOPERATIVE CHOLANGIOGRAM;  Surgeon: Tiney Rouge III, MD;  Location: [facility];  Service: General;  Laterality: N/A;   LUMBAR FUSION  11/09/2016   L3-L5   SPINE SURGERY     Disc   TONSILLECTOMY     Patient Active Problem List   Diagnosis Date Noted   Grade 1 Retrolisthesis of L2/L3 03/19/2023    Latex precautions, history of latex allergy 03/19/2023   At high risk for allergic reaction to latex 03/19/2023   History of allergy to latex 03/19/2023   Lumbar facet joint pain 03/04/2023   Compression fracture of L2 lumbar vertebra, sequela 03/04/2023   Pain at implanted Lumbar hardware 03/04/2023   Fall (on) (from) other stairs and steps, initial encounter 08/06/2020   Lumbar facet joint syndrome 06/18/2020   Spondylosis without myelopathy or radiculopathy, lumbosacral region 06/18/2020   DDD (degenerative disc disease), lumbosacral 06/18/2020   COVID 06/04/2020   Chronic pain syndrome 05/16/2020   Pharmacologic therapy 05/16/2020   Disorder of skeletal system 05/16/2020   Problems influencing health status 05/16/2020   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   Chronic hip pain (2ry area of Pain) (Bilateral) (R>L) 05/16/2020   Chronic lower extremity pain (Left) 05/16/2020   Numbness and tingling of both feet (intermittent/recurrent) 05/16/2020   Pain in rib (Bilateral) (R>L) (intermittent) 05/16/2020   Chronic knee pain (Left) 05/16/2020   Patellar pain (Left) (chronic, intermittent) 05/16/2020   Hard of hearing 05/16/2020   Carcinoma, lung, 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/25/2019   S/P partial lobectomy of lung 02/25/2019   Mass of upper lobe of right lung 01/06/2019  Chronic nonintractable headache 06/14/2018   Chronic headache 12/03/2017   Sinus bradycardia 10/06/2017   Chronic low back pain (1ry area of Pain) (Bilateral) (L>R) w/o sciatica 08/17/2017   Panic attack as reaction to stress 08/17/2017   Anxiety 06/15/2017   Atypical chest pain 06/15/2017   Hyperlipidemia 06/15/2017   Cholecystitis with cholelithiasis 02/27/2016   Increased frequency of urination 01/27/2015   History of night sweats 05/01/2014   Dupuytren's contracture 07/18/2013   Inverted nipple 04/18/2013   Depression 04/23/2010   Fibromyalgia 04/23/2010   Hypercholesterolemia 04/23/2010   Essential hypertension  04/23/2010   Hypothyroidism 04/23/2010   Irritable colon 04/23/2010   Osteoarthritis 04/23/2010    PCP: Cain Sieve, MD  REFERRING PROVIDER: Cain Sieve, MD  REFERRING DIAG: Chronic low back pain; At risk for falls  Rationale for Evaluation and Treatment: Rehabilitation  THERAPY DIAG:  Muscle weakness (generalized)  Other lack of coordination  Chronic bilateral low back pain without sciatica  Other abnormalities of gait and mobility  Chronic left-sided low back pain without sciatica  Abnormality of gait and mobility  Difficulty in walking, not elsewhere classified  Unsteadiness on feet  ONSET DATE: chronic but worse since Jan 2024  SUBJECTIVE:                                                                                                                                                                                           SUBJECTIVE STATEMENT:  Patient reports she was dealing with some diverticultis this past weekend and states low back is still up today- rates at  7/10  today.    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 She has received PT in the past for condition with positive results and currently using pain management strategies including pain meds, Heat, Exercises, Yoga, lidocain patch, diclofenac topical gel, and biofreeze. She was HTN controlled with medication.  She reports still lives alone but having trouble with community and social outings and having to modify how she performs her ADLs and housework due to her chronic pain. PMH- Spondylosis, Scoliosis, HTN, Lung CA.  PAIN:  Are you having pain? Yes 6/10 across low back, midline  PRECAUTIONS: Fall  WEIGHT BEARING RESTRICTIONS: No  FALLS:  Has patient fallen in last 6 months? No- However patient reports some balance issues and near falls- using cane with community outings  PLOF:  Independent  PATIENT GOALS: To decrease my pain  NEXT MD VISIT: unknown   OBJECTIVE:   TODAY'S TREATMENT:  DATE:  05/04/23     THEREX- Instruction in wellzone equipment for upcoming discharge to gym/home based program.  Ham curl - Level 3 - 3 sets of 10 reps Knee ext - Level 3 - 3 sets of 12 reps Calf press - Level 4- 3 sets of 10 reps  Leg press- Level 3 - 3 sets of 10 reps (patient reported mild tightness in Low back yet no increased pain)  Standing side bending with 5# KB 2 sets of 10 reps each side        CGA from PT for safety for all  standing therex throughout session.     PATIENT EDUCATION:  Education details: PT plan of care; Exercise technique  Person educated: Patient Education method: Explanation, Demonstration, Tactile cues, and Verbal cues Education comprehension: verbalized understanding, returned demonstration, verbal cues required, and tactile cues required  HOME EXERCISE PROGRAM: Access Code: [email or web address] URL: [email or web address] Date: 02/18/2023 Prepared by: Maureen Ralphs  Exercises - Seated Sciatic Tensioner  - 1 x daily - 7 x weekly - 3 sets - 20 hold      11/14/22:  Side step c red band around knees, back against wall   Access Code: [email or web address] URL: [email or web address] Date: 10/13/2022 Prepared by: Maureen Ralphs  Exercises - Standing Quadratus Lumborum Stretch with Doorway  - 1 x daily - 7 x weekly - 3 sets - 20-30 sec hold - Standing Quadratus Lumborum Mobilization with Small Ball on Wall  - 1 x daily - 7 x weekly - 3 sets - 10 reps - Supine Quadratus Lumborum Stretch  - 1 x daily - 7 x weekly - 3 sets - 20-30 hold - 90/90 SI Joint Self-Correction with Dowel  - 1 x daily - 7 x weekly - 3 sets - 10 reps - Hooklying Isometric Hip Abduction with Belt  - 1 x daily - 7 x weekly - 3  sets - 10 reps - 5 hold    Access Code: [email or web address] URL: [email or web address] Date: 10/09/2022 Prepared by: Maureen Ralphs  Exercises - Standing Quadratus Lumborum Stretch with Doorway  - 1 x daily - 7 x weekly - 3 sets - 20-30 sec hold - Standing Quadratus Lumborum Mobilization with Small Ball on Wall  - 1 x daily - 7 x weekly - 3 sets - 10 reps - Supine Quadratus Lumborum Stretch  - 1 x daily - 7 x weekly - 3 sets - 20-30 hold  ASSESSMENT:  CLINICAL IMPRESSION: Treatment continues per plan of care - focusing on LE strengthening- transitioning from skilled pT to learning gym based exercises. She again performed well without any complaint of increased Low back pain and required fewer VC for correct use of equipment and exercise technique.  She also required some instruction about frequency/duration of exercise and how to progress either in reps or resistance as appropriate.   Pt will continue to benefit from skilled physical therapy intervention to address impairments, improve QOL, and attain therapy goals.       OBJECTIVE IMPAIRMENTS: Abnormal gait, decreased activity tolerance, decreased balance, decreased endurance, decreased mobility, difficulty walking, decreased strength, and pain.   ACTIVITY LIMITATIONS: carrying, lifting, bending, sitting, standing, squatting, sleeping, stairs, transfers, and bed mobility  PARTICIPATION LIMITATIONS: cleaning, laundry, shopping, community activity, and yard work  PERSONAL FACTORS: Age, Time since onset of injury/illness/exacerbation, and 3+ comorbidities: scoliosis, HTN, degenerative spine  are also affecting patient's functional outcome.   REHAB POTENTIAL: Good  CLINICAL DECISION MAKING: Stable/uncomplicated  EVALUATION COMPLEXITY: Moderate   GOALS: Goals  reviewed with patient? Yes  SHORT TERM GOALS: Target date: 10/24/2022  Pt will decrease worst back pain as reported on NPRS by at least 2 points in order to  demonstrate clinically significant reduction in back pain.  Baseline: Eval= 8/10; 11/17/2022= 4/10 current and up to 7-8/10 at worst.  Goal status: MET  LONG TERM GOALS: Target date: 02/27/2023  Pt will be independent with Final HEP in order to improve strength and decrease back pain in order to improve pain-free function at home and work.  Baseline: EVAL= Patient reports needs update and review of exercises to assist with her back pain; 11/17/2022- Patient performing mostly self stretching and yoga based exercises and will continue to benefit from adding to comprehensive HEP; 12/05/2022- HEP ongoing focusing more on Lumbar strengthening; 03/24/2023- patient has made shift to more balance/LE exercises but will benefit from further training and progression of HEP Goal status: ONGOING  2.  Pt will decrease worst back pain as reported on NPRS by at least 3 points in order to demonstrate clinically significant reduction in back pain.  Baseline: EVAL= 8/10; 11/17/2022= 4/10 current and up to 7-8/10 at worst. 12/05/2022= 4/10 current- and up to 6/10 depending on activity 01/12/23: 4/10 and is consistently at this level. 03/24/2023- general low back pain= 4/10.  Goal status: ONGOING  3.  Pt will decrease MODI score by at least 13 points in order demonstrate clinically significant reduction in back pain/disability.   Baseline: EVAL=58%; 11/17/2022= 40%;12/05/2022= 44% 01/12/23:38% Goal status: MET  4.  Pt will improve FOTO to target score of >55 to display perceived improvements in ability to complete ADL's.  Baseline: EVAL= 54; 11/14/22: 56; 12/05/2022=63 Goal status: MET  5.  Patient (> 53 years old) will complete five times sit to stand test in <15 seconds indicating an increased LE strength and improved balance.  Baseline: EVAL = 20.33; 11/17/2022= 16.55 sec without UE Support; 12/05/2022= 16.47 sec without UE support 01/12/23: 15.5 sec; 03/24/2023=14.38 sec without UE support Goal status: MET  6.  Pt will  increase by at least 0.13 m/s in order to demonstrate clinically significant improvement in community ambulation.   Baseline: EVAL = 0.97 m/s; 6/10=0.98 m/s; 12/05/2022= 0.98 m/s 8/5:1.08 m/s; 03/24/2023= 0.96 m/s normal and 1.2 m/s (fast)  Goal status: MET  7.  Patient will report ability to complete > or equal to 60 min grocery store outing or similar community activiities to return to her previous level of function.  Baseline: EVAL: patient reports difficulty tolerating outings in community. 12/05/2022= Patient reports up to around 40 min of continuous community activities.  01/12/23: last time she went she was up for an hour and used cart and had no issues, has some issues with heat but that has been goingn on for 20 years per pt .  Goal status: MET    8. Pt will improve BERG by at least 3 points in order to demonstrate clinically significant improvement in balance.    Baseline: 09/30/2022= 47/56; 12/05/2022= 53/56  Goal status: MET  9. Pt will increase by at least 90m (139ft) in order to demonstrate clinically significant improvement in cardiopulmonary endurance and community ambulation   Baseline: 09/23/2022= use of SPC RUE (pt preference): starting pain 4/10, then 5/10 at 357ft, and 6/10 at 546ft: audible RR dyspnea. Test terminated after 69ft, 3 minutes 15 seconds; 11/17/2022= 920 feet in 5 min 37 sec c/o 4/10 LBP and some DOE- O2 sat unable to read due to cold fingers- Respirations= 23/min;  12/05/2022= 1050 feet in 6 min  01/12/23: 1030 ft Spo2 95% post bout  Goal status: MET   7. Pt will improve FGA by at least 3 points in order to demonstrate clinically significant improvement in balance and decreased risk for falls.  Baseline: 03/24/2023= 23  Goal status: NEW  8. Patient will transition from skilled PT to gym based program for long term management of strength training.   Baseline: Patient currently involved in skilled PT program for LBP/balance.  Goal status: New  9. Patient  will present with improved dynamic standing balance as seen by SLS  consistently > 10 sec for improved dynamic mobility.   Baseline: 03/24/2023= 4-8 sec at bes  Goal status: NEW  PLAN:  PT FREQUENCY: 1-2x/week  PT DURATION: 12 weeks  PLANNED INTERVENTIONS: Therapeutic exercises, Therapeutic activity, Neuromuscular re-education, Balance training, Gait training, Patient/Family education, Self Care, Joint mobilization, Joint manipulation, Stair training, Vestibular training, Canalith repositioning, Orthotic/Fit training, DME instructions, Dry Needling, Electrical stimulation, Spinal manipulation, Spinal mobilization, Cryotherapy, Moist heat, scar mobilization, Taping, and Manual therapy.  PLAN FOR NEXT SESSION:      Functional LE strengthening and core strength, dynamic balance training.  Wellzone gym based instruction next visit if appropriate.       Louis Meckel, PT 05/04/2023, 4:32 PM   4:32 PM, 05/04/23 Physical Therapist - [facility]  Outpatient Physical Therapy- Main Campus [phone]    4:32 PM, 05/04/23

## 2023-05-06 ENCOUNTER — Ambulatory Visit: Payer: Medicare HMO

## 2023-05-06 DIAGNOSIS — M545 Low back pain, unspecified: Secondary | ICD-10-CM

## 2023-05-06 DIAGNOSIS — G8929 Other chronic pain: Secondary | ICD-10-CM

## 2023-05-06 DIAGNOSIS — R278 Other lack of coordination: Secondary | ICD-10-CM

## 2023-05-06 DIAGNOSIS — M6281 Muscle weakness (generalized): Secondary | ICD-10-CM

## 2023-05-06 DIAGNOSIS — R262 Difficulty in walking, not elsewhere classified: Secondary | ICD-10-CM

## 2023-05-06 DIAGNOSIS — R2689 Other abnormalities of gait and mobility: Secondary | ICD-10-CM

## 2023-05-06 DIAGNOSIS — R269 Unspecified abnormalities of gait and mobility: Secondary | ICD-10-CM

## 2023-05-06 NOTE — Therapy (Addendum)
OUTPATIENT PHYSICAL THERAPY TREATMENT/Physical Therapy Progress Note   Dates of reporting period  03/24/2023   to   05/06/2023   Patient Name: Kendra Benton MRN: [id number] DOB:04/09/1941, 82 y.o., female Today's Date: 05/06/2023  END OF SESSION:  PT End of Session - 05/06/23 1403     Visit Number 40    Number of Visits 46    Date for PT Re-Evaluation 05/19/23    Authorization Type [employer] primary; [place] medicaid secondary    Authorization Time Period 09/11/22-12/04/22    Progress Note Due on Visit 50    PT Start Time 1400    PT Stop Time 1430    PT Time Calculation (min) 30 min    Equipment Utilized During Treatment Gait belt    Activity Tolerance Patient tolerated treatment well;No increased pain    Behavior During Therapy WFL for tasks assessed/performed                           Past Medical History:  Diagnosis Date   Allergy    Anxiety    GERD (gastroesophageal reflux disease)    Headache    History of kidney stones    Hypertension    Myocardial infarction (HCC)    1997   Osteoporosis    Pneumonia    Past Surgical History:  Procedure Laterality Date   ABDOMINAL HYSTERECTOMY     patient has ovaries   APPENDECTOMY     ARTERY BIOPSY Right 12/30/2017   Procedure: BIOPSY TEMPORAL ARTERY;  Surgeon: Renford Dills, MD;  Location: [facility];  Service: Vascular;  Laterality: Right;   ARTERY BIOPSY Left 06/18/2018   Procedure: BIOPSY TEMPORAL ARTERY;  Surgeon: Renford Dills, MD;  Location: [facility];  Service: Vascular;  Laterality: Left;   BACK SURGERY     CARDIAC CATHETERIZATION     CATARACT EXTRACTION W/ INTRAOCULAR LENS  IMPLANT, BILATERAL Bilateral 2010   CHOLECYSTECTOMY N/A 02/28/2016   Procedure: LAPAROSCOPIC CHOLECYSTECTOMY WITH INTRAOPERATIVE CHOLANGIOGRAM;  Surgeon: Tiney Rouge III, MD;  Location: [facility];  Service: General;  Laterality: N/A;   LUMBAR FUSION  11/09/2016   L3-L5   SPINE SURGERY     Disc   TONSILLECTOMY      Patient Active Problem List   Diagnosis Date Noted   Grade 1 Retrolisthesis of L2/L3 03/19/2023   Latex precautions, history of latex allergy 03/19/2023   At high risk for allergic reaction to latex 03/19/2023   History of allergy to latex 03/19/2023   Lumbar facet joint pain 03/04/2023   Compression fracture of L2 lumbar vertebra, sequela 03/04/2023   Pain at implanted Lumbar hardware 03/04/2023   Fall (on) (from) other stairs and steps, initial encounter 08/06/2020   Lumbar facet joint syndrome 06/18/2020   Spondylosis without myelopathy or radiculopathy, lumbosacral region 06/18/2020   DDD (degenerative disc disease), lumbosacral 06/18/2020   COVID 06/04/2020   Chronic pain syndrome 05/16/2020   Pharmacologic therapy 05/16/2020   Disorder of skeletal system 05/16/2020   Problems influencing health status 05/16/2020   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   Chronic hip pain (2ry area of Pain) (Bilateral) (R>L) 05/16/2020   Chronic lower extremity pain (Left) 05/16/2020   Numbness and tingling of both feet (intermittent/recurrent) 05/16/2020   Pain in rib (Bilateral) (R>L) (intermittent) 05/16/2020   Chronic knee pain (Left) 05/16/2020   Patellar pain (Left) (chronic, intermittent) 05/16/2020   Hard of hearing 05/16/2020   Carcinoma, lung, 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/25/2019  S/P partial lobectomy of lung 02/25/2019   Mass of upper lobe of right lung 01/06/2019   Chronic nonintractable headache 06/14/2018   Chronic headache 12/03/2017   Sinus bradycardia 10/06/2017   Chronic low back pain (1ry area of Pain) (Bilateral) (L>R) w/o sciatica 08/17/2017   Panic attack as reaction to stress 08/17/2017   Anxiety 06/15/2017   Atypical chest pain 06/15/2017   Hyperlipidemia 06/15/2017   Cholecystitis with cholelithiasis 02/27/2016   Increased frequency of urination 01/27/2015   History of night sweats 05/01/2014   Dupuytren's contracture 07/18/2013   Inverted nipple 04/18/2013    Depression 04/23/2010   Fibromyalgia 04/23/2010   Hypercholesterolemia 04/23/2010   Essential hypertension 04/23/2010   Hypothyroidism 04/23/2010   Irritable colon 04/23/2010   Osteoarthritis 04/23/2010    PCP: Cain Sieve, MD  REFERRING PROVIDER: Cain Sieve, MD  REFERRING DIAG: Chronic low back pain; At risk for falls  Rationale for Evaluation and Treatment: Rehabilitation  THERAPY DIAG:  Muscle weakness (generalized)  Other lack of coordination  Chronic bilateral low back pain without sciatica  Other abnormalities of gait and mobility  Chronic left-sided low back pain without sciatica  Abnormality of gait and mobility  Difficulty in walking, not elsewhere classified  ONSET DATE: chronic but worse since Jan 2024  SUBJECTIVE:                                                                                                                                                                                           SUBJECTIVE STATEMENT:  Patient reports ongoing low back pain - rates at 7/10  today.    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 She has received PT in the past for condition with positive results and currently using pain management strategies including pain meds, Heat, Exercises, Yoga, lidocain patch, diclofenac topical gel, and biofreeze. She was HTN controlled with medication.  She reports still lives alone but having trouble with community and social outings and having to modify how she performs her ADLs and housework due to her chronic pain. PMH- Spondylosis, Scoliosis, HTN, Lung CA.  PAIN:  Are you having pain? Yes 6/10 across low back, midline  PRECAUTIONS: Fall  WEIGHT BEARING RESTRICTIONS: No  FALLS:  Has patient fallen in last 6 months? No- However patient reports some balance issues and near falls- using cane with community  outings  PLOF: Independent  PATIENT GOALS: To decrease my pain  NEXT MD VISIT: unknown   OBJECTIVE:   TODAY'S TREATMENT:  DATE:  05/06/23     Physical therapy treatment session today consisted of completing assessment of goals and administration of testing as demonstrated and documented in flow sheet, treatment, and goals section of this note. Addition treatments may be found below.   FGA= 24/30 Single leg standing= 10 sec each LE     THEREX-  Patient deferred going to [facility] after testing today   Agreeable to some standing LE strengthening in gym=side stepping in gym over hurdle in // bars 6 times then requesting to end stating just not feeling well. Session terminated per patient request      CGA from PT for safety for all  standing therex throughout session.     PATIENT EDUCATION:  Education details: PT plan of care; Exercise technique  Person educated: Patient Education method: Explanation, Demonstration, Tactile cues, and Verbal cues Education comprehension: verbalized understanding, returned demonstration, verbal cues required, and tactile cues required  HOME EXERCISE PROGRAM: Access Code: [email or web address] URL: [email or web address] Date: 02/18/2023 Prepared by: Maureen Ralphs  Exercises - Seated Sciatic Tensioner  - 1 x daily - 7 x weekly - 3 sets - 20 hold      11/14/22:  Side step c red band around knees, back against wall   Access Code: [email or web address] URL: [email or web address] Date: 10/13/2022 Prepared by: Maureen Ralphs  Exercises - Standing Quadratus Lumborum Stretch with Doorway  - 1 x daily - 7 x weekly - 3 sets - 20-30 sec hold - Standing Quadratus Lumborum Mobilization with Small Ball on Wall  - 1 x daily - 7 x weekly - 3 sets - 10 reps - Supine Quadratus Lumborum Stretch  - 1 x daily - 7 x  weekly - 3 sets - 20-30 hold - 90/90 SI Joint Self-Correction with Dowel  - 1 x daily - 7 x weekly - 3 sets - 10 reps - Hooklying Isometric Hip Abduction with Belt  - 1 x daily - 7 x weekly - 3 sets - 10 reps - 5 hold    Access Code: [email or web address] URL: [email or web address] Date: 10/09/2022 Prepared by: Maureen Ralphs  Exercises - Standing Quadratus Lumborum Stretch with Doorway  - 1 x daily - 7 x weekly - 3 sets - 20-30 sec hold - Standing Quadratus Lumborum Mobilization with Small Ball on Wall  - 1 x daily - 7 x weekly - 3 sets - 10 reps - Supine Quadratus Lumborum Stretch  - 1 x daily - 7 x weekly - 3 sets - 20-30 hold  ASSESSMENT:  CLINICAL IMPRESSION: Patient presents with excellent motivation for today's progress note visit. She was able to demonstrate improved overall balance as seen by slight increased score with FGA particularly with horizontal head turning today. She also presented with much improved overall SLS balance- each LE at 10 sec today meeting that balance goal. Patient's condition has the potential to improve in response to therapy. Maximum improvement is yet to be obtained. The anticipated improvement is attainable and reasonable in a generally predictable time.   Pt will continue to benefit from skilled physical therapy intervention to address impairments, improve QOL, and attain therapy goals.       OBJECTIVE IMPAIRMENTS: Abnormal gait, decreased activity tolerance, decreased balance, decreased endurance, decreased mobility, difficulty walking, decreased strength, and pain.   ACTIVITY LIMITATIONS: carrying, lifting, bending, sitting, standing, squatting, sleeping, stairs, transfers, and bed mobility  PARTICIPATION LIMITATIONS: cleaning, laundry, shopping, community activity, and yard work  PERSONAL FACTORS: Age, Time since onset of injury/illness/exacerbation, and 3+ comorbidities: scoliosis, HTN,  degenerative spine  are also affecting patient's  functional outcome.   REHAB POTENTIAL: Good  CLINICAL DECISION MAKING: Stable/uncomplicated  EVALUATION COMPLEXITY: Moderate   GOALS: Goals reviewed with patient? Yes  SHORT TERM GOALS: Target date: 10/24/2022  Pt will decrease worst back pain as reported on NPRS by at least 2 points in order to demonstrate clinically significant reduction in back pain.  Baseline: Eval= 8/10; 11/17/2022= 4/10 current and up to 7-8/10 at worst.  Goal status: MET  LONG TERM GOALS: Target date: 02/27/2023  Pt will be independent with Final HEP in order to improve strength and decrease back pain in order to improve pain-free function at home and work.  Baseline: EVAL= Patient reports needs update and review of exercises to assist with her back pain; 11/17/2022- Patient performing mostly self stretching and yoga based exercises and will continue to benefit from adding to comprehensive HEP; 12/05/2022- HEP ongoing focusing more on Lumbar strengthening; 03/24/2023- patient has made shift to more balance/LE exercises but will benefit from further training and progression of HEP; 05/06/2023= Patient continues to progress HEP with most recent gym and balance activities.  Goal status: ONGOING  2.  Pt will decrease worst back pain as reported on NPRS by at least 3 points in order to demonstrate clinically significant reduction in back pain.  Baseline: EVAL= 8/10; 11/17/2022= 4/10 current and up to 7-8/10 at worst. 12/05/2022= 4/10 current- and up to 6/10 depending on activity 01/12/23: 4/10 and is consistently at this level. 03/24/2023- general low back pain= 4/10. 05/06/2023- Increased report of low back pain this week - reporting 7/10 Goal status: ONGOING  3.  Pt will decrease MODI score by at least 13 points in order demonstrate clinically significant reduction in back pain/disability.   Baseline: EVAL=58%; 11/17/2022= 40%;12/05/2022= 44% 01/12/23:38% Goal status: MET  4.  Pt will improve FOTO to target score of >55 to  display perceived improvements in ability to complete ADL's.  Baseline: EVAL= 54; 11/14/22: 56; 12/05/2022=63 Goal status: MET  5.  Patient (> 14 years old) will complete five times sit to stand test in <15 seconds indicating an increased LE strength and improved balance.  Baseline: EVAL = 20.33; 11/17/2022= 16.55 sec without UE Support; 12/05/2022= 16.47 sec without UE support 01/12/23: 15.5 sec; 03/24/2023=14.38 sec without UE support Goal status: MET  6.  Pt will increase by at least 0.13 m/s in order to demonstrate clinically significant improvement in community ambulation.   Baseline: EVAL = 0.97 m/s; 6/10=0.98 m/s; 12/05/2022= 0.98 m/s 8/5:1.08 m/s; 03/24/2023= 0.96 m/s normal and 1.2 m/s (fast)  Goal status: MET  7.  Patient will report ability to complete > or equal to 60 min grocery store outing or similar community activiities to return to her previous level of function.  Baseline: EVAL: patient reports difficulty tolerating outings in community. 12/05/2022= Patient reports up to around 40 min of continuous community activities.  01/12/23: last time she went she was up for an hour and used cart and had no issues, has some issues with heat but that has been goingn on for 20 years per pt .  Goal status: MET    8. Pt will improve BERG by at least 3 points in order to demonstrate clinically significant improvement in balance.    Baseline: 09/30/2022= 47/56; 12/05/2022= 53/56  Goal status: MET  9. Pt will increase by at least 71m (191ft) in order to demonstrate clinically significant improvement in cardiopulmonary endurance and community ambulation   Baseline: 09/23/2022= use  of SPC RUE (pt preference): starting pain 4/10, then 5/10 at 373ft, and 6/10 at 577ft: audible RR dyspnea. Test terminated after 682ft, 3 minutes 15 seconds; 11/17/2022= 920 feet in 5 min 37 sec c/o 4/10 LBP and some DOE- O2 sat unable to read due to cold fingers- Respirations= 23/min; 12/05/2022= 1050 feet in 6 min   01/12/23: 1030 ft Spo2 95% post bout  Goal status: MET   7. Pt will improve FGA by at least 3 points in order to demonstrate clinically significant improvement in balance and decreased risk for falls.  Baseline: 03/24/2023= 23  Goal status: NEW  8. Patient will transition from skilled PT to gym based program for long term management of strength training.   Baseline: Patient currently involved in skilled PT program for LBP/balance.  Goal status: New  9. Patient will present with improved dynamic standing balance as seen by SLS  consistently > 10 sec for improved dynamic mobility.   Baseline: 03/24/2023= 4-8 sec at best; 05/06/2023=  Goal status: NEW  PLAN:  PT FREQUENCY: 1-2x/week  PT DURATION: 12 weeks  PLANNED INTERVENTIONS: Therapeutic exercises, Therapeutic activity, Neuromuscular re-education, Balance training, Gait training, Patient/Family education, Self Care, Joint mobilization, Joint manipulation, Stair training, Vestibular training, Canalith repositioning, Orthotic/Fit training, DME instructions, Dry Needling, Electrical stimulation, Spinal manipulation, Spinal mobilization, Cryotherapy, Moist heat, scar mobilization, Taping, and Manual therapy.  PLAN FOR NEXT SESSION:      Functional LE strengthening and core strength, dynamic balance training.  [facility] gym based instruction next visit if appropriate.   Debara Pickett, SPT  This entire session was performed under direct supervision and direction of a licensed Estate agent . I have personally read, edited and approve of the note as written.     Louis Meckel, PT 05/06/2023, 2:35 PM   2:35 PM, 05/06/23 Physical Therapist - [facility]  Outpatient Physical Therapy- Main [facility] [phone]    2:35 PM, 05/06/23

## 2023-05-11 ENCOUNTER — Ambulatory Visit: Payer: Medicare HMO

## 2023-05-11 NOTE — Therapy (Incomplete)
OUTPATIENT PHYSICAL THERAPY TREATMENT    Patient Name: Kendra Benton MRN: [id number] DOB:Oct 22, 1940, 82 y.o., female Today's Date: 05/11/2023  END OF SESSION:                  Past Medical History:  Diagnosis Date   Allergy    Anxiety    GERD (gastroesophageal reflux disease)    Headache    History of kidney stones    Hypertension    Myocardial infarction (HCC)    1997   Osteoporosis    Pneumonia    Past Surgical History:  Procedure Laterality Date   ABDOMINAL HYSTERECTOMY     patient has ovaries   APPENDECTOMY     ARTERY BIOPSY Right 12/30/2017   Procedure: BIOPSY TEMPORAL ARTERY;  Surgeon: Renford Dills, MD;  Location: [facility];  Service: Vascular;  Laterality: Right;   ARTERY BIOPSY Left 06/18/2018   Procedure: BIOPSY TEMPORAL ARTERY;  Surgeon: Renford Dills, MD;  Location: [facility];  Service: Vascular;  Laterality: Left;   BACK SURGERY     CARDIAC CATHETERIZATION     CATARACT EXTRACTION W/ INTRAOCULAR LENS  IMPLANT, BILATERAL Bilateral 2010   CHOLECYSTECTOMY N/A 02/28/2016   Procedure: LAPAROSCOPIC CHOLECYSTECTOMY WITH INTRAOPERATIVE CHOLANGIOGRAM;  Surgeon: Tiney Rouge III, MD;  Location: [facility];  Service: General;  Laterality: N/A;   LUMBAR FUSION  11/09/2016   L3-L5   SPINE SURGERY     Disc   TONSILLECTOMY     Patient Active Problem List   Diagnosis Date Noted   Grade 1 Retrolisthesis of L2/L3 03/19/2023   Latex precautions, history of latex allergy 03/19/2023   At high risk for allergic reaction to latex 03/19/2023   History of allergy to latex 03/19/2023   Lumbar facet joint pain 03/04/2023   Compression fracture of L2 lumbar vertebra, sequela 03/04/2023   Pain at implanted Lumbar hardware 03/04/2023   Fall (on) (from) other stairs and steps, initial encounter 08/06/2020   Lumbar facet joint syndrome 06/18/2020   Spondylosis without myelopathy or radiculopathy, lumbosacral region 06/18/2020   DDD (degenerative disc disease),  lumbosacral 06/18/2020   COVID 06/04/2020   Chronic pain syndrome 05/16/2020   Pharmacologic therapy 05/16/2020   Disorder of skeletal system 05/16/2020   Problems influencing health status 05/16/2020   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   Chronic hip pain (2ry area of Pain) (Bilateral) (R>L) 05/16/2020   Chronic lower extremity pain (Left) 05/16/2020   Numbness and tingling of both feet (intermittent/recurrent) 05/16/2020   Pain in rib (Bilateral) (R>L) (intermittent) 05/16/2020   Chronic knee pain (Left) 05/16/2020   Patellar pain (Left) (chronic, intermittent) 05/16/2020   Hard of hearing 05/16/2020   Carcinoma, lung, 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/25/2019   S/P partial lobectomy of lung 02/25/2019   Mass of upper lobe of right lung 01/06/2019   Chronic nonintractable headache 06/14/2018   Chronic headache 12/03/2017   Sinus bradycardia 10/06/2017   Chronic low back pain (1ry area of Pain) (Bilateral) (L>R) w/o sciatica 08/17/2017   Panic attack as reaction to stress 08/17/2017   Anxiety 06/15/2017   Atypical chest pain 06/15/2017   Hyperlipidemia 06/15/2017   Cholecystitis with cholelithiasis 02/27/2016   Increased frequency of urination 01/27/2015   History of night sweats 05/01/2014   Dupuytren's contracture 07/18/2013   Inverted nipple 04/18/2013   Depression 04/23/2010   Fibromyalgia 04/23/2010   Hypercholesterolemia 04/23/2010   Essential hypertension 04/23/2010   Hypothyroidism 04/23/2010   Irritable colon 04/23/2010   Osteoarthritis 04/23/2010  PCP: Cain Sieve, MD  REFERRING PROVIDER: Cain Sieve, MD  REFERRING DIAG: Chronic low back pain; At risk for falls  Rationale for Evaluation and Treatment: Rehabilitation  THERAPY DIAG:  No diagnosis found.  ONSET DATE: chronic but worse since Jan 2024  SUBJECTIVE:                                                                                                                                                                                            SUBJECTIVE STATEMENT:  *** Patient reports ongoing low back pain - rates at 7/10  today.    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 She has received PT in the past for condition with positive results and currently using pain management strategies including pain meds, Heat, Exercises, Yoga, lidocain patch, diclofenac topical gel, and biofreeze. She was HTN controlled with medication.  She reports still lives alone but having trouble with community and social outings and having to modify how she performs her ADLs and housework due to her chronic pain. PMH- Spondylosis, Scoliosis, HTN, Lung CA.  PAIN:  Are you having pain? Yes 6/10 across low back, midline  PRECAUTIONS: Fall  WEIGHT BEARING RESTRICTIONS: No  FALLS:  Has patient fallen in last 6 months? No- However patient reports some balance issues and near falls- using cane with community outings  PLOF: Independent  PATIENT GOALS: To decrease my pain  NEXT MD VISIT: unknown   OBJECTIVE:   TODAY'S TREATMENT:                                                                                                                              DATE:  05/11/23        CGA from PT for safety for all  standing therex throughout session.     PATIENT EDUCATION:  Education details: PT plan of care; Exercise technique  Person educated: Patient Education method: Explanation, Demonstration, Tactile cues, and Verbal cues Education comprehension: verbalized understanding, returned demonstration, verbal cues required,  and tactile cues required  HOME EXERCISE PROGRAM: Access Code: [email or web address] URL: [email or web address] Date: 02/18/2023 Prepared by: Maureen Ralphs  Exercises - Seated Sciatic Tensioner  - 1 x daily - 7 x weekly - 3 sets - 20 hold      11/14/22:  Side step c red band  around knees, back against wall   Access Code: [email or web address] URL: [email or web address] Date: 10/13/2022 Prepared by: Maureen Ralphs  Exercises - Standing Quadratus Lumborum Stretch with Doorway  - 1 x daily - 7 x weekly - 3 sets - 20-30 sec hold - Standing Quadratus Lumborum Mobilization with Small Ball on Wall  - 1 x daily - 7 x weekly - 3 sets - 10 reps - Supine Quadratus Lumborum Stretch  - 1 x daily - 7 x weekly - 3 sets - 20-30 hold - 90/90 SI Joint Self-Correction with Dowel  - 1 x daily - 7 x weekly - 3 sets - 10 reps - Hooklying Isometric Hip Abduction with Belt  - 1 x daily - 7 x weekly - 3 sets - 10 reps - 5 hold    Access Code: [email or web address] URL: [email or web address] Date: 10/09/2022 Prepared by: Maureen Ralphs  Exercises - Standing Quadratus Lumborum Stretch with Doorway  - 1 x daily - 7 x weekly - 3 sets - 20-30 sec hold - Standing Quadratus Lumborum Mobilization with Small Ball on Wall  - 1 x daily - 7 x weekly - 3 sets - 10 reps - Supine Quadratus Lumborum Stretch  - 1 x daily - 7 x weekly - 3 sets - 20-30 hold  ASSESSMENT:  CLINICAL IMPRESSION: ***   Pt will continue to benefit from skilled physical therapy intervention to address impairments, improve QOL, and attain therapy goals.       OBJECTIVE IMPAIRMENTS: Abnormal gait, decreased activity tolerance, decreased balance, decreased endurance, decreased mobility, difficulty walking, decreased strength, and pain.   ACTIVITY LIMITATIONS: carrying, lifting, bending, sitting, standing, squatting, sleeping, stairs, transfers, and bed mobility  PARTICIPATION LIMITATIONS: cleaning, laundry, shopping, community activity, and yard work  PERSONAL FACTORS: Age, Time since onset of injury/illness/exacerbation, and 3+ comorbidities: scoliosis, HTN, degenerative spine  are also affecting patient's functional outcome.   REHAB POTENTIAL: Good  CLINICAL DECISION MAKING:  Stable/uncomplicated  EVALUATION COMPLEXITY: Moderate   GOALS: Goals reviewed with patient? Yes  SHORT TERM GOALS: Target date: 10/24/2022  Pt will decrease worst back pain as reported on NPRS by at least 2 points in order to demonstrate clinically significant reduction in back pain.  Baseline: Eval= 8/10; 11/17/2022= 4/10 current and up to 7-8/10 at worst.  Goal status: MET  LONG TERM GOALS: Target date: 02/27/2023  Pt will be independent with Final HEP in order to improve strength and decrease back pain in order to improve pain-free function at home and work.  Baseline: EVAL= Patient reports needs update and review of exercises to assist with her back pain; 11/17/2022- Patient performing mostly self stretching and yoga based exercises and will continue to benefit from adding to comprehensive HEP; 12/05/2022- HEP ongoing focusing more on Lumbar strengthening; 03/24/2023- patient has made shift to more balance/LE exercises but will benefit from further training and progression of HEP; 05/06/2023= Patient continues to progress HEP with most recent gym and balance activities.  Goal status: ONGOING  2.  Pt will decrease worst back pain as reported on NPRS by at least 3 points in order to demonstrate clinically significant reduction in back pain.  Baseline: EVAL= 8/10; 11/17/2022= 4/10 current and up to  7-8/10 at worst. 12/05/2022= 4/10 current- and up to 6/10 depending on activity 01/12/23: 4/10 and is consistently at this level. 03/24/2023- general low back pain= 4/10. 05/06/2023- Increased report of low back pain this week - reporting 7/10 Goal status: ONGOING  3.  Pt will decrease MODI score by at least 13 points in order demonstrate clinically significant reduction in back pain/disability.   Baseline: EVAL=58%; 11/17/2022= 40%;12/05/2022= 44% 01/12/23:38% Goal status: MET  4.  Pt will improve FOTO to target score of >55 to display perceived improvements in ability to complete ADL's.  Baseline: EVAL=  54; 11/14/22: 56; 12/05/2022=63 Goal status: MET  5.  Patient (> 78 years old) will complete five times sit to stand test in <15 seconds indicating an increased LE strength and improved balance.  Baseline: EVAL = 20.33; 11/17/2022= 16.55 sec without UE Support; 12/05/2022= 16.47 sec without UE support 01/12/23: 15.5 sec; 03/24/2023=14.38 sec without UE support Goal status: MET  6.  Pt will increase by at least 0.13 m/s in order to demonstrate clinically significant improvement in community ambulation.   Baseline: EVAL = 0.97 m/s; 6/10=0.98 m/s; 12/05/2022= 0.98 m/s 8/5:1.08 m/s; 03/24/2023= 0.96 m/s normal and 1.2 m/s (fast)  Goal status: MET  7.  Patient will report ability to complete > or equal to 60 min grocery store outing or similar community activiities to return to her previous level of function.  Baseline: EVAL: patient reports difficulty tolerating outings in community. 12/05/2022= Patient reports up to around 40 min of continuous community activities.  01/12/23: last time she went she was up for an hour and used cart and had no issues, has some issues with heat but that has been goingn on for 20 years per pt .  Goal status: MET    8. Pt will improve BERG by at least 3 points in order to demonstrate clinically significant improvement in balance.    Baseline: 09/30/2022= 47/56; 12/05/2022= 53/56  Goal status: MET  9. Pt will increase by at least 39m (177ft) in order to demonstrate clinically significant improvement in cardiopulmonary endurance and community ambulation   Baseline: 09/23/2022= use of SPC RUE (pt preference): starting pain 4/10, then 5/10 at 357ft, and 6/10 at 525ft: audible RR dyspnea. Test terminated after 677ft, 3 minutes 15 seconds; 11/17/2022= 920 feet in 5 min 37 sec c/o 4/10 LBP and some DOE- O2 sat unable to read due to cold fingers- Respirations= 23/min; 12/05/2022= 1050 feet in 6 min  01/12/23: 1030 ft Spo2 95% post bout  Goal status: MET   7. Pt will improve FGA by  at least 3 points in order to demonstrate clinically significant improvement in balance and decreased risk for falls.  Baseline: 03/24/2023= 23  Goal status: NEW  8. Patient will transition from skilled PT to gym based program for long term management of strength training.   Baseline: Patient currently involved in skilled PT program for LBP/balance.  Goal status: New  9. Patient will present with improved dynamic standing balance as seen by SLS  consistently > 10 sec for improved dynamic mobility.   Baseline: 03/24/2023= 4-8 sec at best; 05/06/2023=  Goal status: NEW  PLAN:  PT FREQUENCY: 1-2x/week  PT DURATION: 12 weeks  PLANNED INTERVENTIONS: Therapeutic exercises, Therapeutic activity, Neuromuscular re-education, Balance training, Gait training, Patient/Family education, Self Care, Joint mobilization, Joint manipulation, Stair training, Vestibular training, Canalith repositioning, Orthotic/Fit training, DME instructions, Dry Needling, Electrical stimulation, Spinal manipulation, Spinal mobilization, Cryotherapy, Moist heat, scar mobilization, Taping, and Manual therapy.  PLAN FOR NEXT SESSION:      Functional LE strengthening and core strength, dynamic balance training.  [employer] gym based instruction next visit if appropriate.      Louis Meckel, PT 05/11/2023, 8:46 AM   8:46 AM, 05/11/23 Physical Therapist - [facility]  Outpatient Physical Therapy- [facility] [phone]    8:46 AM, 05/11/23

## 2023-05-13 ENCOUNTER — Ambulatory Visit: Payer: Medicare HMO

## 2023-05-13 NOTE — Therapy (Incomplete)
OUTPATIENT PHYSICAL THERAPY TREATMENT    Patient Name: MURL MACCARONE MRN: [id number] DOB:26-Sep-1940, 82 y.o., female Today's Date: 05/13/2023  END OF SESSION:                  Past Medical History:  Diagnosis Date   Allergy    Anxiety    GERD (gastroesophageal reflux disease)    Headache    History of kidney stones    Hypertension    Myocardial infarction (HCC)    1997   Osteoporosis    Pneumonia    Past Surgical History:  Procedure Laterality Date   ABDOMINAL HYSTERECTOMY     patient has ovaries   APPENDECTOMY     ARTERY BIOPSY Right 12/30/2017   Procedure: BIOPSY TEMPORAL ARTERY;  Surgeon: Renford Dills, MD;  Location: [facility];  Service: Vascular;  Laterality: Right;   ARTERY BIOPSY Left 06/18/2018   Procedure: BIOPSY TEMPORAL ARTERY;  Surgeon: Renford Dills, MD;  Location: [facility];  Service: Vascular;  Laterality: Left;   BACK SURGERY     CARDIAC CATHETERIZATION     CATARACT EXTRACTION W/ INTRAOCULAR LENS  IMPLANT, BILATERAL Bilateral 2010   CHOLECYSTECTOMY N/A 02/28/2016   Procedure: LAPAROSCOPIC CHOLECYSTECTOMY WITH INTRAOPERATIVE CHOLANGIOGRAM;  Surgeon: Tiney Rouge III, MD;  Location: [facility];  Service: General;  Laterality: N/A;   LUMBAR FUSION  11/09/2016   L3-L5   SPINE SURGERY     Disc   TONSILLECTOMY     Patient Active Problem List   Diagnosis Date Noted   Grade 1 Retrolisthesis of L2/L3 03/19/2023   Latex precautions, history of latex allergy 03/19/2023   At high risk for allergic reaction to latex 03/19/2023   History of allergy to latex 03/19/2023   Lumbar facet joint pain 03/04/2023   Compression fracture of L2 lumbar vertebra, sequela 03/04/2023   Pain at implanted Lumbar hardware 03/04/2023   Fall (on) (from) other stairs and steps, initial encounter 08/06/2020   Lumbar facet joint syndrome 06/18/2020   Spondylosis without myelopathy or radiculopathy, lumbosacral region 06/18/2020   DDD (degenerative disc disease),  lumbosacral 06/18/2020   COVID 06/04/2020   Chronic pain syndrome 05/16/2020   Pharmacologic therapy 05/16/2020   Disorder of skeletal system 05/16/2020   Problems influencing health status 05/16/2020   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   Chronic hip pain (2ry area of Pain) (Bilateral) (R>L) 05/16/2020   Chronic lower extremity pain (Left) 05/16/2020   Numbness and tingling of both feet (intermittent/recurrent) 05/16/2020   Pain in rib (Bilateral) (R>L) (intermittent) 05/16/2020   Chronic knee pain (Left) 05/16/2020   Patellar pain (Left) (chronic, intermittent) 05/16/2020   Hard of hearing 05/16/2020   Carcinoma, lung, 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/25/2019   S/P partial lobectomy of lung 02/25/2019   Mass of upper lobe of right lung 01/06/2019   Chronic nonintractable headache 06/14/2018   Chronic headache 12/03/2017   Sinus bradycardia 10/06/2017   Chronic low back pain (1ry area of Pain) (Bilateral) (L>R) w/o sciatica 08/17/2017   Panic attack as reaction to stress 08/17/2017   Anxiety 06/15/2017   Atypical chest pain 06/15/2017   Hyperlipidemia 06/15/2017   Cholecystitis with cholelithiasis 02/27/2016   Increased frequency of urination 01/27/2015   History of night sweats 05/01/2014   Dupuytren's contracture 07/18/2013   Inverted nipple 04/18/2013   Depression 04/23/2010   Fibromyalgia 04/23/2010   Hypercholesterolemia 04/23/2010   Essential hypertension 04/23/2010   Hypothyroidism 04/23/2010   Irritable colon 04/23/2010   Osteoarthritis 04/23/2010  PCP: Cain Sieve, MD  REFERRING PROVIDER: Cain Sieve, MD  REFERRING DIAG: Chronic low back pain; At risk for falls  Rationale for Evaluation and Treatment: Rehabilitation  THERAPY DIAG:  No diagnosis found.  ONSET DATE: chronic but worse since Jan 2024  SUBJECTIVE:                                                                                                                                                                                            SUBJECTIVE STATEMENT:  *** Patient reports ongoing low back pain - rates at 7/10  today.    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 She has received PT in the past for condition with positive results and currently using pain management strategies including pain meds, Heat, Exercises, Yoga, lidocain patch, diclofenac topical gel, and biofreeze. She was HTN controlled with medication.  She reports still lives alone but having trouble with community and social outings and having to modify how she performs her ADLs and housework due to her chronic pain. PMH- Spondylosis, Scoliosis, HTN, Lung CA.  PAIN:  Are you having pain? Yes 6/10 across low back, midline  PRECAUTIONS: Fall  WEIGHT BEARING RESTRICTIONS: No  FALLS:  Has patient fallen in last 6 months? No- However patient reports some balance issues and near falls- using cane with community outings  PLOF: Independent  PATIENT GOALS: To decrease my pain  NEXT MD VISIT: unknown   OBJECTIVE:   TODAY'S TREATMENT:                                                                                                                              DATE:  05/13/23        CGA from PT for safety for all  standing therex throughout session.     PATIENT EDUCATION:  Education details: PT plan of care; Exercise technique  Person educated: Patient Education method: Explanation, Demonstration, Tactile cues, and Verbal cues Education comprehension: verbalized understanding, returned demonstration, verbal cues required,  and tactile cues required  HOME EXERCISE PROGRAM: Access Code: [email or web address] URL: [email or web address] Date: 02/18/2023 Prepared by: Maureen Ralphs  Exercises - Seated Sciatic Tensioner  - 1 x daily - 7 x weekly - 3 sets - 20 hold      11/14/22:  Side step c red band  around knees, back against wall   Access Code: [email or web address] URL: [email or web address] Date: 10/13/2022 Prepared by: Maureen Ralphs  Exercises - Standing Quadratus Lumborum Stretch with Doorway  - 1 x daily - 7 x weekly - 3 sets - 20-30 sec hold - Standing Quadratus Lumborum Mobilization with Small Ball on Wall  - 1 x daily - 7 x weekly - 3 sets - 10 reps - Supine Quadratus Lumborum Stretch  - 1 x daily - 7 x weekly - 3 sets - 20-30 hold - 90/90 SI Joint Self-Correction with Dowel  - 1 x daily - 7 x weekly - 3 sets - 10 reps - Hooklying Isometric Hip Abduction with Belt  - 1 x daily - 7 x weekly - 3 sets - 10 reps - 5 hold    Access Code: [email or web address] URL: [email or web address] Date: 10/09/2022 Prepared by: Maureen Ralphs  Exercises - Standing Quadratus Lumborum Stretch with Doorway  - 1 x daily - 7 x weekly - 3 sets - 20-30 sec hold - Standing Quadratus Lumborum Mobilization with Small Ball on Wall  - 1 x daily - 7 x weekly - 3 sets - 10 reps - Supine Quadratus Lumborum Stretch  - 1 x daily - 7 x weekly - 3 sets - 20-30 hold  ASSESSMENT:  CLINICAL IMPRESSION: ***   Pt will continue to benefit from skilled physical therapy intervention to address impairments, improve QOL, and attain therapy goals.       OBJECTIVE IMPAIRMENTS: Abnormal gait, decreased activity tolerance, decreased balance, decreased endurance, decreased mobility, difficulty walking, decreased strength, and pain.   ACTIVITY LIMITATIONS: carrying, lifting, bending, sitting, standing, squatting, sleeping, stairs, transfers, and bed mobility  PARTICIPATION LIMITATIONS: cleaning, laundry, shopping, community activity, and yard work  PERSONAL FACTORS: Age, Time since onset of injury/illness/exacerbation, and 3+ comorbidities: scoliosis, HTN, degenerative spine  are also affecting patient's functional outcome.   REHAB POTENTIAL: Good  CLINICAL DECISION MAKING:  Stable/uncomplicated  EVALUATION COMPLEXITY: Moderate   GOALS: Goals reviewed with patient? Yes  SHORT TERM GOALS: Target date: 10/24/2022  Pt will decrease worst back pain as reported on NPRS by at least 2 points in order to demonstrate clinically significant reduction in back pain.  Baseline: Eval= 8/10; 11/17/2022= 4/10 current and up to 7-8/10 at worst.  Goal status: MET  LONG TERM GOALS: Target date: 02/27/2023  Pt will be independent with Final HEP in order to improve strength and decrease back pain in order to improve pain-free function at home and work.  Baseline: EVAL= Patient reports needs update and review of exercises to assist with her back pain; 11/17/2022- Patient performing mostly self stretching and yoga based exercises and will continue to benefit from adding to comprehensive HEP; 12/05/2022- HEP ongoing focusing more on Lumbar strengthening; 03/24/2023- patient has made shift to more balance/LE exercises but will benefit from further training and progression of HEP; 05/06/2023= Patient continues to progress HEP with most recent gym and balance activities.  Goal status: ONGOING  2.  Pt will decrease worst back pain as reported on NPRS by at least 3 points in order to demonstrate clinically significant reduction in back pain.  Baseline: EVAL= 8/10; 11/17/2022= 4/10 current and up to  7-8/10 at worst. 12/05/2022= 4/10 current- and up to 6/10 depending on activity 01/12/23: 4/10 and is consistently at this level. 03/24/2023- general low back pain= 4/10. 05/06/2023- Increased report of low back pain this week - reporting 7/10 Goal status: ONGOING  3.  Pt will decrease MODI score by at least 13 points in order demonstrate clinically significant reduction in back pain/disability.   Baseline: EVAL=58%; 11/17/2022= 40%;12/05/2022= 44% 01/12/23:38% Goal status: MET  4.  Pt will improve FOTO to target score of >55 to display perceived improvements in ability to complete ADL's.  Baseline: EVAL=  54; 11/14/22: 56; 12/05/2022=63 Goal status: MET  5.  Patient (> 32 years old) will complete five times sit to stand test in <15 seconds indicating an increased LE strength and improved balance.  Baseline: EVAL = 20.33; 11/17/2022= 16.55 sec without UE Support; 12/05/2022= 16.47 sec without UE support 01/12/23: 15.5 sec; 03/24/2023=14.38 sec without UE support Goal status: MET  6.  Pt will increase by at least 0.13 m/s in order to demonstrate clinically significant improvement in community ambulation.   Baseline: EVAL = 0.97 m/s; 6/10=0.98 m/s; 12/05/2022= 0.98 m/s 8/5:1.08 m/s; 03/24/2023= 0.96 m/s normal and 1.2 m/s (fast)  Goal status: MET  7.  Patient will report ability to complete > or equal to 60 min grocery store outing or similar community activiities to return to her previous level of function.  Baseline: EVAL: patient reports difficulty tolerating outings in community. 12/05/2022= Patient reports up to around 40 min of continuous community activities.  01/12/23: last time she went she was up for an hour and used cart and had no issues, has some issues with heat but that has been goingn on for 20 years per pt .  Goal status: MET    8. Pt will improve BERG by at least 3 points in order to demonstrate clinically significant improvement in balance.    Baseline: 09/30/2022= 47/56; 12/05/2022= 53/56  Goal status: MET  9. Pt will increase by at least 28m (171ft) in order to demonstrate clinically significant improvement in cardiopulmonary endurance and community ambulation   Baseline: 09/23/2022= use of SPC RUE (pt preference): starting pain 4/10, then 5/10 at 370ft, and 6/10 at 546ft: audible RR dyspnea. Test terminated after 621ft, 3 minutes 15 seconds; 11/17/2022= 920 feet in 5 min 37 sec c/o 4/10 LBP and some DOE- O2 sat unable to read due to cold fingers- Respirations= 23/min; 12/05/2022= 1050 feet in 6 min  01/12/23: 1030 ft Spo2 95% post bout  Goal status: MET   7. Pt will improve FGA by  at least 3 points in order to demonstrate clinically significant improvement in balance and decreased risk for falls.  Baseline: 03/24/2023= 23  Goal status: NEW  8. Patient will transition from skilled PT to gym based program for long term management of strength training.   Baseline: Patient currently involved in skilled PT program for LBP/balance.  Goal status: New  9. Patient will present with improved dynamic standing balance as seen by SLS  consistently > 10 sec for improved dynamic mobility.   Baseline: 03/24/2023= 4-8 sec at best; 05/06/2023=  Goal status: NEW  PLAN:  PT FREQUENCY: 1-2x/week  PT DURATION: 12 weeks  PLANNED INTERVENTIONS: Therapeutic exercises, Therapeutic activity, Neuromuscular re-education, Balance training, Gait training, Patient/Family education, Self Care, Joint mobilization, Joint manipulation, Stair training, Vestibular training, Canalith repositioning, Orthotic/Fit training, DME instructions, Dry Needling, Electrical stimulation, Spinal manipulation, Spinal mobilization, Cryotherapy, Moist heat, scar mobilization, Taping, and Manual therapy.  PLAN FOR NEXT SESSION:      Functional LE strengthening and core strength, dynamic balance training.  [employer] gym based instruction next visit if appropriate.      Louis Meckel, PT 05/13/2023, 8:35 AM   8:35 AM, 05/13/23 Physical Therapist - [facility]  Outpatient Physical Therapy- [facility] [phone]    8:35 AM, 05/13/23

## 2023-05-18 ENCOUNTER — Ambulatory Visit: Payer: Medicare HMO | Attending: Internal Medicine

## 2023-05-18 DIAGNOSIS — G8929 Other chronic pain: Secondary | ICD-10-CM | POA: Diagnosis present

## 2023-05-18 DIAGNOSIS — M545 Low back pain, unspecified: Secondary | ICD-10-CM | POA: Insufficient documentation

## 2023-05-18 DIAGNOSIS — R278 Other lack of coordination: Secondary | ICD-10-CM | POA: Diagnosis present

## 2023-05-18 DIAGNOSIS — R2689 Other abnormalities of gait and mobility: Secondary | ICD-10-CM | POA: Diagnosis present

## 2023-05-18 DIAGNOSIS — M6281 Muscle weakness (generalized): Secondary | ICD-10-CM | POA: Diagnosis present

## 2023-05-18 DIAGNOSIS — R2681 Unsteadiness on feet: Secondary | ICD-10-CM | POA: Insufficient documentation

## 2023-05-18 DIAGNOSIS — R262 Difficulty in walking, not elsewhere classified: Secondary | ICD-10-CM | POA: Diagnosis present

## 2023-05-18 DIAGNOSIS — R269 Unspecified abnormalities of gait and mobility: Secondary | ICD-10-CM | POA: Diagnosis present

## 2023-05-18 NOTE — Therapy (Signed)
OUTPATIENT PHYSICAL THERAPY TREATMENT/ DISCHARGE VISIT    Patient Name: Kendra Benton MRN: [id number] DOB:[birth year], 82 y.o., female Today's Date: 05/18/2023  END OF SESSION:  PT End of Session - 05/18/23 1431     Visit Number 41    Number of Visits 46    Date for PT Re-Evaluation 05/19/23    Authorization Type [employer] primary;  [employer] medicaid secondary    Authorization Time Period 09/11/22-12/04/22    Progress Note Due on Visit 50    PT Start Time 1315    PT Stop Time 1345    PT Time Calculation (min) 30 min    Equipment Utilized During Treatment Gait belt    Activity Tolerance Patient tolerated treatment well;No increased pain    Behavior During Therapy WFL for tasks assessed/performed                            Past Medical History:  Diagnosis Date   Allergy    Anxiety    GERD (gastroesophageal reflux disease)    Headache    History of kidney stones    Hypertension    Myocardial infarction (HCC)    1997   Osteoporosis    Pneumonia    Past Surgical History:  Procedure Laterality Date   ABDOMINAL HYSTERECTOMY     patient has ovaries   APPENDECTOMY     ARTERY BIOPSY Right 12/30/2017   Procedure: BIOPSY TEMPORAL ARTERY;  Surgeon: Renford Dills, MD;  Location: [facility];  Service: Vascular;  Laterality: Right;   ARTERY BIOPSY Left 06/18/2018   Procedure: BIOPSY TEMPORAL ARTERY;  Surgeon: Renford Dills, MD;  Location: [facility];  Service: Vascular;  Laterality: Left;   BACK SURGERY     CARDIAC CATHETERIZATION     CATARACT EXTRACTION W/ INTRAOCULAR LENS  IMPLANT, BILATERAL Bilateral 2010   CHOLECYSTECTOMY N/A 02/28/2016   Procedure: LAPAROSCOPIC CHOLECYSTECTOMY WITH INTRAOPERATIVE CHOLANGIOGRAM;  Surgeon: Tiney Rouge III, MD;  Location: [facility];  Service: General;  Laterality: N/A;   LUMBAR FUSION  11/09/2016   L3-L5   SPINE SURGERY     Disc   TONSILLECTOMY     Patient Active Problem List   Diagnosis Date Noted   Grade 1  Retrolisthesis of L2/L3 03/19/2023   Latex precautions, history of latex allergy 03/19/2023   At high risk for allergic reaction to latex 03/19/2023   History of allergy to latex 03/19/2023   Lumbar facet joint pain 03/04/2023   Compression fracture of L2 lumbar vertebra, sequela 03/04/2023   Pain at implanted Lumbar hardware 03/04/2023   Fall (on) (from) other stairs and steps, initial encounter 08/06/2020   Lumbar facet joint syndrome 06/18/2020   Spondylosis without myelopathy or radiculopathy, lumbosacral region 06/18/2020   DDD (degenerative disc disease), lumbosacral 06/18/2020   COVID 06/04/2020   Chronic pain syndrome 05/16/2020   Pharmacologic therapy 05/16/2020   Disorder of skeletal system 05/16/2020   Problems influencing health status 05/16/2020   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   Chronic hip pain (2ry area of Pain) (Bilateral) (R>L) 05/16/2020   Chronic lower extremity pain (Left) 05/16/2020   Numbness and tingling of both feet (intermittent/recurrent) 05/16/2020   Pain in rib (Bilateral) (R>L) (intermittent) 05/16/2020   Chronic knee pain (Left) 05/16/2020   Patellar pain (Left) (chronic, intermittent) 05/16/2020   Hard of hearing 05/16/2020   Carcinoma, lung, 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/25/2019   S/P partial lobectomy of lung 02/25/2019   Mass of upper  lobe of right lung 01/06/2019   Chronic nonintractable headache 06/14/2018   Chronic headache 12/03/2017   Sinus bradycardia 10/06/2017   Chronic low back pain (1ry area of Pain) (Bilateral) (L>R) w/o sciatica 08/17/2017   Panic attack as reaction to stress 08/17/2017   Anxiety 06/15/2017   Atypical chest pain 06/15/2017   Hyperlipidemia 06/15/2017   Cholecystitis with cholelithiasis 02/27/2016   Increased frequency of urination 01/27/2015   History of night sweats 05/01/2014   Dupuytren's contracture 07/18/2013   Inverted nipple 04/18/2013   Depression 04/23/2010   Fibromyalgia 04/23/2010   Hypercholesterolemia  04/23/2010   Essential hypertension 04/23/2010   Hypothyroidism 04/23/2010   Irritable colon 04/23/2010   Osteoarthritis 04/23/2010    PCP: Cain Sieve, MD  REFERRING PROVIDER: Cain Sieve, MD  REFERRING DIAG: Chronic low back pain; At risk for falls  Rationale for Evaluation and Treatment: Rehabilitation  THERAPY DIAG:  Muscle weakness (generalized)  Other lack of coordination  Chronic bilateral low back pain without sciatica  Other abnormalities of gait and mobility  Chronic left-sided low back pain without sciatica  Abnormality of gait and mobility  Difficulty in walking, not elsewhere classified  Unsteadiness on feet  ONSET DATE: chronic but worse since Jan 2024  SUBJECTIVE:                                                                                                                                                                                           SUBJECTIVE STATEMENT:   Patient reports same chronic low back pain - rates at a 7/10. States she is going to go with silver sneakers at [employer] including doing some work in the pool as well as the gym. States she will continue with her yoga, balance and stretching/strengthening.    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 She has received PT in the past for condition with positive results and currently using pain management strategies including pain meds, Heat, Exercises, Yoga, lidocain patch, diclofenac topical gel, and biofreeze. She was HTN controlled with medication.  She reports still lives alone but having trouble with community and social outings and having to modify how she performs her ADLs and housework due to her chronic pain. PMH- Spondylosis, Scoliosis, HTN, Lung CA.  PAIN:  Are you having pain? Yes 6/10 across low back, midline  PRECAUTIONS: Fall  WEIGHT BEARING RESTRICTIONS:  No  FALLS:  Has patient fallen in last 6 months? No- However patient reports some balance issues and near falls- using cane with community outings  PLOF: Independent  PATIENT GOALS: To decrease my pain  NEXT MD VISIT: unknown   OBJECTIVE:   TODAY'S TREATMENT:                                                                                                                              DATE:  05/18/23     THEREX:  Spent session reviewing plan for home/gym based activities.  Reviewed verbally at length- lumbar/LE stretching including- SKC, Lower trunk rotation, Hamstring, Thoracic rotation (open book), piriformis and figure 4 stretching, lumbar flex. Patient presents with excellent knowledge in all stretching and encouraged to continue to perform daily.  Balance- Reviewed SLS and tandem stance and how to progress to using her balance pad as appropriate.  Reviewed core and LE strengthening using resistive band (performed hip abd with GTB x 15 reps) and reviewed all seated/standing exercises. Also reviewed postural strengthening using Resistive band- Scap row and horizontal shoulder abd- 2 sets of 10 reps with RTB.  Discussed importance of using pool and how to incorporate LE exercises and resistive gait with floatation device for max benefit of use of pool.    CGA from PT for safety for all  standing therex throughout session.     PATIENT EDUCATION:  Education details: PT plan of care; Exercise technique  Person educated: Patient Education method: Explanation, Demonstration, Tactile cues, and Verbal cues Education comprehension: verbalized understanding, returned demonstration, verbal cues required, and tactile cues required  HOME EXERCISE PROGRAM: Access Code: [email or web address] URL: [email or web address] Date: 02/18/2023 Prepared by: Maureen Ralphs  Exercises - Seated Sciatic Tensioner  - 1 x daily - 7 x weekly - 3 sets - 20 hold      11/14/22:  Side step c red band  around knees, back against wall   Access Code: [email or web address] URL: [email or web address] Date: 10/13/2022 Prepared by: Maureen Ralphs  Exercises - Standing Quadratus Lumborum Stretch with Doorway  - 1 x daily - 7 x weekly - 3 sets - 20-30 sec hold - Standing Quadratus Lumborum Mobilization with Small Ball on Wall  - 1 x daily - 7 x weekly - 3 sets - 10 reps - Supine Quadratus Lumborum Stretch  - 1 x daily - 7 x weekly - 3 sets - 20-30 hold - 90/90 SI Joint Self-Correction with Dowel  - 1 x daily - 7 x weekly - 3 sets - 10 reps - Hooklying Isometric Hip Abduction with Belt  - 1 x daily - 7 x weekly - 3 sets - 10 reps - 5 hold    Access Code: [email or web address] URL: [email or web address] Date: 10/09/2022 Prepared by: Maureen Ralphs  Exercises - Standing Quadratus Lumborum Stretch with Doorway  - 1 x daily - 7 x weekly - 3 sets - 20-30 sec hold - Standing Quadratus Lumborum Mobilization with Small Ball on Wall  - 1 x daily - 7 x weekly - 3 sets - 10 reps - Supine Quadratus Lumborum Stretch  - 1 x daily - 7  x weekly - 3 sets - 20-30 hold  ASSESSMENT:  CLINICAL IMPRESSION: Patient presents with excellent motivation for her last scheduled PT visit. Treatment focused mainly on review of entire HEP-discussing low back stretching and core/Low back/LE strengthening, gym/water based exercises. She presents with good knowledge of how to manage her chronic low back symptoms and is appropriate for discharge today with goals met except for pain goal. Patient in agreement with plan and no further services warranted at this time.       OBJECTIVE IMPAIRMENTS: Abnormal gait, decreased activity tolerance, decreased balance, decreased endurance, decreased mobility, difficulty walking, decreased strength, and pain.   ACTIVITY LIMITATIONS: carrying, lifting, bending, sitting, standing, squatting, sleeping, stairs, transfers, and bed mobility  PARTICIPATION LIMITATIONS: cleaning,  laundry, shopping, community activity, and yard work  PERSONAL FACTORS: Age, Time since onset of injury/illness/exacerbation, and 3+ comorbidities: scoliosis, HTN, degenerative spine  are also affecting patient's functional outcome.   REHAB POTENTIAL: Good  CLINICAL DECISION MAKING: Stable/uncomplicated  EVALUATION COMPLEXITY: Moderate   GOALS: Goals reviewed with patient? Yes  SHORT TERM GOALS: Target date: 10/24/2022  Pt will decrease worst back pain as reported on NPRS by at least 2 points in order to demonstrate clinically significant reduction in back pain.  Baseline: Eval= 8/10; 11/17/2022= 4/10 current and up to 7-8/10 at worst.  Goal status: MET  LONG TERM GOALS: Target date: 02/27/2023  Pt will be independent with Final HEP in order to improve strength and decrease back pain in order to improve pain-free function at home and work.  Baseline: EVAL= Patient reports needs update and review of exercises to assist with her back pain; 11/17/2022- Patient performing mostly self stretching and yoga based exercises and will continue to benefit from adding to comprehensive HEP; 12/05/2022- HEP ongoing focusing more on Lumbar strengthening; 03/24/2023- patient has made shift to more balance/LE exercises but will benefit from further training and progression of HEP; 05/06/2023= Patient continues to progress HEP with most recent gym and balance activities.  Goal status: MET  2.  Pt will decrease worst back pain as reported on NPRS by at least 3 points in order to demonstrate clinically significant reduction in back pain.  Baseline: EVAL= 8/10; 11/17/2022= 4/10 current and up to 7-8/10 at worst. 12/05/2022= 4/10 current- and up to 6/10 depending on activity 01/12/23: 4/10 and is consistently at this level. 03/24/2023- general low back pain= 4/10. 05/06/2023- Increased report of low back pain this week - reporting 7/10 Goal status: ONGOING  3.  Pt will decrease MODI score by at least 13 points in  order demonstrate clinically significant reduction in back pain/disability.   Baseline: EVAL=58%; 11/17/2022= 40%;12/05/2022= 44% 01/12/23:38% Goal status: MET  4.  Pt will improve FOTO to target score of >55 to display perceived improvements in ability to complete ADL's.  Baseline: EVAL= 54; 11/14/22: 56; 12/05/2022=63 Goal status: MET  5.  Patient (> 73 years old) will complete five times sit to stand test in <15 seconds indicating an increased LE strength and improved balance.  Baseline: EVAL = 20.33; 11/17/2022= 16.55 sec without UE Support; 12/05/2022= 16.47 sec without UE support 01/12/23: 15.5 sec; 03/24/2023=14.38 sec without UE support Goal status: MET  6.  Pt will increase by at least 0.13 m/s in order to demonstrate clinically significant improvement in community ambulation.   Baseline: EVAL = 0.97 m/s; 6/10=0.98 m/s; 12/05/2022= 0.98 m/s 8/5:1.08 m/s; 03/24/2023= 0.96 m/s normal and 1.2 m/s (fast)  Goal status: MET  7.  Patient will report ability  to complete > or equal to 60 min grocery store outing or similar community activiities to return to her previous level of function.  Baseline: EVAL: patient reports difficulty tolerating outings in community. 12/05/2022= Patient reports up to around 40 min of continuous community activities.  01/12/23: last time she went she was up for an hour and used cart and had no issues, has some issues with heat but that has been goingn on for 20 years per pt .  Goal status: MET    8. Pt will improve BERG by at least 3 points in order to demonstrate clinically significant improvement in balance.    Baseline: 09/30/2022= 47/56; 12/05/2022= 53/56  Goal status: MET  9. Pt will increase by at least 52m (167ft) in order to demonstrate clinically significant improvement in cardiopulmonary endurance and community ambulation   Baseline: 09/23/2022= use of SPC RUE (pt preference): starting pain 4/10, then 5/10 at 323ft, and 6/10 at 5108ft: audible RR dyspnea.  Test terminated after 640ft, 3 minutes 15 seconds; 11/17/2022= 920 feet in 5 min 37 sec c/o 4/10 LBP and some DOE- O2 sat unable to read due to cold fingers- Respirations= 23/min; 12/05/2022= 1050 feet in 6 min  01/12/23: 1030 ft Spo2 95% post bout  Goal status: MET   7. Pt will improve FGA by at least 3 points in order to demonstrate clinically significant improvement in balance and decreased risk for falls.  Baseline: 03/24/2023= 23; 05/06/2023= 24/30  Goal status: PROGRESSING  8. Patient will transition from skilled PT to gym based program for long term management of strength training.   Baseline: Patient currently involved in skilled PT program for LBP/balance. 05/18/2023- Patient presents with independent knowledge in HEP and plans to attend [facility] [facility] 2-3 times per week after discharge today.   Goal status: MET  9. Patient will present with improved dynamic standing balance as seen by SLS  consistently > 10 sec for improved dynamic mobility.   Baseline: 03/24/2023= 4-8 sec at best; 05/06/2023=10 sec each LE  Goal status: MET PLAN:  PT FREQUENCY: 1-2x/week  PT DURATION: 12 weeks  PLANNED INTERVENTIONS: Therapeutic exercises, Therapeutic activity, Neuromuscular re-education, Balance training, Gait training, Patient/Family education, Self Care, Joint mobilization, Joint manipulation, Stair training, Vestibular training, Canalith repositioning, Orthotic/Fit training, DME instructions, Dry Needling, Electrical stimulation, Spinal manipulation, Spinal mobilization, Cryotherapy, Moist heat, scar mobilization, Taping, and Manual therapy.  PLAN FOR NEXT SESSION:   Discharge to HEP today.     Louis Meckel, PT 05/18/2023, 2:46 PM   2:46 PM, 05/18/23 Physical Therapist - [facility]  Outpatient Physical Therapy- [facility] [phone]    2:46 PM, 05/18/23

## 2023-05-20 ENCOUNTER — Ambulatory Visit: Payer: Medicare HMO

## 2023-05-25 ENCOUNTER — Ambulatory Visit: Payer: Medicare HMO

## 2023-05-27 ENCOUNTER — Ambulatory Visit: Payer: Medicare HMO

## 2023-06-09 ENCOUNTER — Ambulatory Visit: Payer: Medicare HMO

## 2023-06-16 ENCOUNTER — Ambulatory Visit: Payer: Medicare HMO

## 2023-06-18 ENCOUNTER — Ambulatory Visit: Payer: Medicare HMO

## 2023-06-23 ENCOUNTER — Ambulatory Visit: Payer: Medicare HMO

## 2023-06-26 ENCOUNTER — Ambulatory Visit: Payer: Medicare HMO

## 2024-02-09 ENCOUNTER — Other Ambulatory Visit: Payer: Self-pay

## 2024-02-09 ENCOUNTER — Encounter: Payer: Self-pay | Admitting: Emergency Medicine

## 2024-02-09 ENCOUNTER — Emergency Department
Admission: EM | Admit: 2024-02-09 | Discharge: 2024-02-09 | Disposition: A | Discharge status: Home / Self Care | Attending: Emergency Medicine | Admitting: Emergency Medicine

## 2024-02-09 ENCOUNTER — Emergency Department

## 2024-02-09 DIAGNOSIS — I10 Essential (primary) hypertension: Secondary | ICD-10-CM | POA: Diagnosis not present

## 2024-02-09 DIAGNOSIS — Z79899 Other long term (current) drug therapy: Secondary | ICD-10-CM | POA: Insufficient documentation

## 2024-02-09 DIAGNOSIS — S42201A Unspecified fracture of upper end of right humerus, initial encounter for closed fracture: Secondary | ICD-10-CM

## 2024-02-09 DIAGNOSIS — S4991XA Unspecified injury of right shoulder and upper arm, initial encounter: Secondary | ICD-10-CM | POA: Diagnosis present

## 2024-02-09 DIAGNOSIS — Z23 Encounter for immunization: Secondary | ICD-10-CM | POA: Diagnosis not present

## 2024-02-09 DIAGNOSIS — W19XXXA Unspecified fall, initial encounter: Secondary | ICD-10-CM

## 2024-02-09 DIAGNOSIS — W010XXA Fall on same level from slipping, tripping and stumbling without subsequent striking against object, initial encounter: Secondary | ICD-10-CM | POA: Diagnosis not present

## 2024-02-09 DIAGNOSIS — S51011A Laceration without foreign body of right elbow, initial encounter: Secondary | ICD-10-CM | POA: Insufficient documentation

## 2024-02-09 DIAGNOSIS — S42291A Other displaced fracture of upper end of right humerus, initial encounter for closed fracture: Secondary | ICD-10-CM | POA: Diagnosis not present

## 2024-02-09 MED ORDER — ACETAMINOPHEN 500 MG PO TABS: 1000.0000 mg | ORAL_TABLET | Freq: Four times a day (QID) | ORAL | Status: DC | PRN

## 2024-02-09 MED ORDER — ONDANSETRON 4 MG PO TBDP: 4.0000 mg | ORAL_TABLET | Freq: Three times a day (TID) | ORAL | Status: DC | PRN

## 2024-02-09 MED ORDER — DOCUSATE SODIUM 100 MG PO CAPS: 100.0000 mg | ORAL_CAPSULE | Freq: Two times a day (BID) | ORAL | 0 refills | Status: AC

## 2024-02-09 MED ORDER — OXYCODONE HCL 5 MG PO TABS: 5.0000 mg | ORAL_TABLET | Freq: Three times a day (TID) | ORAL | 0 refills | Status: AC | PRN

## 2024-02-09 MED ORDER — OXYCODONE HCL 5 MG PO TABS
5.0000 mg | ORAL_TABLET | Freq: Once | ORAL | Status: AC
  Administered 2024-02-09: 5 mg via ORAL
  Filled 2024-02-09: qty 1

## 2024-02-09 MED ORDER — MORPHINE SULFATE (PF) 4 MG/ML IV SOLN
4.0000 mg | Freq: Once | INTRAVENOUS | Status: AC
  Administered 2024-02-09: 4 mg via INTRAMUSCULAR
  Filled 2024-02-09: qty 1

## 2024-02-09 MED ORDER — OXYCODONE HCL 5 MG PO TABS: 5.0000 mg | ORAL_TABLET | Freq: Four times a day (QID) | ORAL | Status: DC | PRN

## 2024-02-09 MED ORDER — TETANUS-DIPHTH-ACELL PERTUSSIS 5-2.5-18.5 LF-MCG/0.5 IM SUSY
0.5000 mL | PREFILLED_SYRINGE | Freq: Once | INTRAMUSCULAR | Status: AC
  Administered 2024-02-09: 0.5 mL via INTRAMUSCULAR
  Filled 2024-02-09: qty 0.5

## 2024-02-09 MED ORDER — ONDANSETRON 4 MG PO TBDP: 4.0000 mg | ORAL_TABLET | Freq: Four times a day (QID) | ORAL | 0 refills | Status: AC | PRN

## 2024-02-09 MED ORDER — ONDANSETRON 4 MG PO TBDP
4.0000 mg | ORAL_TABLET | Freq: Once | ORAL | Status: AC
  Administered 2024-02-09: 4 mg via ORAL
  Filled 2024-02-09: qty 1

## 2024-02-09 MED ORDER — BACITRACIN ZINC 500 UNIT/GM EX OINT
TOPICAL_OINTMENT | Freq: Once | CUTANEOUS | Status: AC
  Administered 2024-02-09: 1 via TOPICAL
  Filled 2024-02-09: qty 0.9

## 2024-02-09 NOTE — Discharge Instructions (Addendum)

## 2024-02-09 NOTE — ED Provider Notes (Addendum)
 [facility] Provider Note    Event Date/Time   First MD Initiated Contact with Patient 02/09/24 0147     (approximate)   History   Fall   HPI  Kendra Benton is a 83 y.o. female with history of hypertension, osteoporosis who presents to the emergency department the EMS after she fell.  States she tripped putting her pajama pants on and fell onto her right side.  Complaining of right shoulder and elbow pain.  Has a skin tear to the right elbow.  She is right-hand dominant.  Denies hitting her head or losing consciousness.  Denies being on blood thinners.  No preceding symptoms that led to her fall.   History provided by patient, EMS.    Past Medical History:  Diagnosis Date   Allergy    Anxiety    GERD (gastroesophageal reflux disease)    Headache    History of kidney stones    Hypertension    Myocardial infarction [facility])    1997   Osteoporosis    Pneumonia     Past Surgical History:  Procedure Laterality Date   ABDOMINAL HYSTERECTOMY     patient has ovaries   APPENDECTOMY     ARTERY BIOPSY Right 12/30/2017   Procedure: BIOPSY TEMPORAL ARTERY;  Surgeon: Jama Cordella MATSU, MD;  Location: [facility] ORS;  Service: Vascular;  Laterality: Right;   ARTERY BIOPSY Left 06/18/2018   Procedure: BIOPSY TEMPORAL ARTERY;  Surgeon: Jama Cordella MATSU, MD;  Location: [facility] ORS;  Service: Vascular;  Laterality: Left;   BACK SURGERY     CARDIAC CATHETERIZATION     CATARACT EXTRACTION W/ INTRAOCULAR LENS  IMPLANT, BILATERAL Bilateral 2010   CHOLECYSTECTOMY N/A 02/28/2016   Procedure: LAPAROSCOPIC CHOLECYSTECTOMY WITH INTRAOPERATIVE CHOLANGIOGRAM;  Surgeon: Elgin Laurence III, MD;  Location: [facility] ORS;  Service: General;  Laterality: N/A;   LUMBAR FUSION  11/09/2016   L3-L5   SPINE SURGERY     Disc   TONSILLECTOMY      MEDICATIONS:  Prior to Admission medications   Medication Sig Start Date End Date Taking? Authorizing Provider  acetaminophen  (TYLENOL ) 500 MG tablet  Take 1,000 mg by mouth 2 (two) times daily as needed for moderate pain or headache.     [provider]  ALBUTEROL IN Inhale into the lungs.    [provider]  atorvastatin (LIPITOR) 40 MG tablet Take 40 mg by mouth daily.    [provider]  calcium-vitamin D (OSCAL WITH D) 500-200 MG-UNIT tablet Take 1 tablet by mouth.    [provider]  dicyclomine (BENTYL) 10 MG capsule Take 10 mg by mouth 2 (two) times daily as needed for spasms. 08/17/14   [provider]  DULoxetine  (CYMBALTA ) 20 MG capsule Take 40 mg by mouth daily.    [provider]  DULoxetine  (CYMBALTA ) 60 MG capsule Take 60 mg by mouth every evening.     [provider]  estradiol (ESTRACE) 0.5 MG tablet Take 1 mg by mouth daily.  04/15/17   [provider]  furosemide (LASIX) 20 MG tablet Take 20 mg by mouth daily as needed.    [provider]  gabapentin (NEURONTIN) 100 MG capsule Take by mouth 2 (two) times daily. 06/07/19   [provider]  Hypromellose (ARTIFICIAL TEARS OP) Place 1-2 drops into both eyes daily as needed (for dry eyes).    [provider]  lisinopril (PRINIVIL,ZESTRIL) 10 MG tablet Take 10 mg by mouth daily. 06/14/18  06/14/19  [provider]  loperamide (IMODIUM) 2 MG capsule Take by mouth as needed for diarrhea or loose stools.    [provider]  losartan (COZAAR) 50 MG tablet Take 50 mg by mouth in the morning and at bedtime.    [provider]  naproxen sodium (ALEVE) 220 MG tablet Take 220 mg by mouth daily as needed.    [provider]  omeprazole (PRILOSEC) 20 MG capsule Take 20 mg by mouth daily.    [provider]  OXcarbazepine (TRILEPTAL) 150 MG tablet Take 150 mg by mouth 2 (two) times daily. 03/28/20   [provider]  polycarbophil (FIBERCON) 625 MG tablet Take 625 mg by mouth daily.    [provider]  potassium chloride  SA (KLOR-CON ) 20 MEQ  tablet Take 20 mEq by mouth 2 (two) times daily.    [provider]  temazepam  (RESTORIL ) 30 MG capsule Take 30 mg by mouth at bedtime.    [provider]  TURMERIC PO Take 900 mg by mouth daily.    [provider]    Physical Exam   Triage Vital Signs: ED Triage Vitals  Encounter Vitals Group     BP 02/09/24 0148 (!) 156/57     Girls Systolic BP Percentile --      Girls Diastolic BP Percentile --      Boys Systolic BP Percentile --      Boys Diastolic BP Percentile --      Pulse Rate 02/09/24 0148 76     Resp 02/09/24 0148 19     Temp 02/09/24 0148 97.9 F (36.6 C)     Temp Source 02/09/24 0148 Oral     SpO2 02/09/24 0148 100 %     Weight 02/09/24 0151 132 lb (59.9 kg)     Height 02/09/24 0151 5' 2 (1.575 m)     Head Circumference --      Peak Flow --      Pain Score 02/09/24 0151 8     Pain Loc --      Pain Education --      Exclude from Growth Chart --     Most recent vital signs: Vitals:   02/09/24 0149 02/09/24 0230  BP:  (!) 155/63  Pulse: 76 73  Resp:    Temp:    SpO2: 100% 100%     CONSTITUTIONAL: Alert, responds appropriately to questions. Well-appearing; well-nourished; GCS 15 HEAD: Normocephalic; atraumatic EYES: Conjunctivae clear, PERRL, EOMI ENT: normal nose; no rhinorrhea; moist mucous membranes; pharynx without lesions noted; no dental injury; no septal hematoma, no epistaxis; no facial deformity or bony tenderness NECK: Supple, no midline spinal tenderness, step-off or deformity; trachea midline CARD: RRR; S1 and S2 appreciated; no murmurs, no clicks, no rubs, no gallops RESP: Normal chest excursion without splinting or tachypnea; breath sounds clear and equal bilaterally; no wheezes, no rhonchi, no rales; no hypoxia or respiratory distress CHEST:  chest wall stable, no crepitus or ecchymosis or deformity, nontender to palpation; no flail chest ABD/GI: Non-distended; soft, non-tender, no rebound, no guarding; no ecchymosis  or other lesions noted PELVIS:  stable, nontender to palpation BACK:  The back appears normal; no midline spinal tenderness, step-off or deformity EXT: Tender to palpation to the right proximal humerus and right elbow.  Right elbow is swollen with ecchymosis and a small skin tear.  Decreased range of motion in the shoulder, elbow due to pain but no tenderness or decreased range of motion in  the right wrist or fingers.  2+ right radial pulse.  Compartments of the right arm are soft. SKIN: Normal color for age and race; warm NEURO: No facial asymmetry, normal speech, moving all extremities equally  ED Results / Procedures / Treatments   LABS: (all labs ordered are listed, but only abnormal results are displayed) Labs Reviewed - No data to display   EKG:    RADIOLOGY: My personal review and interpretation of imaging: X-rays show proximal humeral neck fracture.  I have personally reviewed all radiology reports. DG Elbow Complete Right Result Date: 02/09/2024 CLINICAL DATA:  Fall with right elbow pain and limited mobility. EXAM: RIGHT ELBOW - COMPLETE 3+ VIEW COMPARISON:  None Available. FINDINGS: There is no evidence of fracture, dislocation, or joint effusion. There is no evidence of arthropathy or other focal bone abnormality. Soft tissues are unremarkable. There are artifacts from overlying clothing. IMPRESSION: No evidence of fracture or dislocation. Electronically Signed   By: Francis Quam M.D.   On: 02/09/2024 03:03   DG Shoulder Right Portable Result Date: 02/09/2024 CLINICAL DATA:  Fall, right shoulder pain EXAM: RIGHT SHOULDER - 1 VIEW COMPARISON:  None Available. FINDINGS: There is an acute fracture of the surgical neck of the humerus which is favored to represent a pathologic fracture given the presence of a corticated circumscribed margin at the fracture plane. The humeral head is still seated glenoid fossa. Scapula and visualized clavicle appear intact. Acromioclavicular joint  space is preserved. Glenohumeral joint space is preserved. IMPRESSION: 1. Acute fracture of the surgical neck of the humerus, favored to represent a pathologic fracture. This could be better assessed with dedicated MRI examination with contrast. Electronically Signed   By: Dorethia Molt M.D.   On: 02/09/2024 03:01     PROCEDURES:  Critical Care performed: No    Procedures    IMPRESSION / MDM / ASSESSMENT AND PLAN / ED COURSE  I reviewed the triage vital signs and the nursing notes.  Patient here with mechanical fall with right arm pain.    DIFFERENTIAL DIAGNOSIS (includes but not limited to):   Humerus fracture, elbow fracture, less likely shoulder or elbow dislocation, small skin tear, no sign of compartment syndrome  Patient's presentation is most consistent with acute presentation with potential threat to life or bodily function.  PLAN: Will obtain x-rays of the right shoulder and elbow.  Will update her tetanus vaccine.  Will give pain medication.  She is adamant that she did not hit her head.   MEDICATIONS GIVEN IN ED: Medications  morphine  (PF) 4 MG/ML injection 4 mg (has no administration in time range)  Tdap (BOOSTRIX) injection 0.5 mL (0.5 mLs Intramuscular Given 02/09/24 0211)  oxyCODONE  (Oxy IR/ROXICODONE ) immediate release tablet 5 mg (5 mg Oral Given 02/09/24 0208)  ondansetron  (ZOFRAN -ODT) disintegrating tablet 4 mg (4 mg Oral Given 02/09/24 0211)  bacitracin  ointment (1 Application Topical Given 02/09/24 0228)     ED COURSE: X-rays reviewed and interpreted by myself and the radiologist.  She has a proximal humeral neck fracture.  Will place her in a sling/shoulder immobilizer and give her orthopedic follow-up.  Will discharge with prescription of pain medication.  She states she will have a ride pick her up at 7 AM.  At this time, I do not feel there is any life-threatening condition present. I reviewed all nursing notes, vitals, pertinent previous records.  All lab  and urine results, EKGs, imaging ordered have been independently reviewed and interpreted by myself.  I reviewed  all available radiology reports from any imaging ordered this visit.  Based on my assessment, I feel the patient is safe to be discharged home without further emergent workup and can continue workup as an outpatient as needed. Discussed all findings, treatment plan as well as usual and customary return precautions.  They verbalize understanding and are comfortable with this plan.  Outpatient follow-up has been provided as needed.  All questions have been answered.  6:45 AM  Pt now concerned about taking care of herself at home and interested in rehab placement.  I have offered to allow her to talk to social work and see physical therapy while she is here but we did discuss that sometimes this process can take days to get to a nursing facility.  We did discuss that normally this is an injury that we send patients home with all of the time and that I did not feel she met criteria for admission given her pain seems to be well-controlled currently.  We discussed that admission would only be for observation and she would likely be discharged later today or early tomorrow morning.  Patient declines wanting to stay and talk to social worker see physical therapy.  I will place home health orders but have encouraged her to call orthopedics in the morning for an appointment and also to call her primary care doctor who could help set up rehab as an outpatient.  She does have family that lives locally but states that her daughter [place] currently at the beach.  She has not called her yet to give her an update on what happened but states she will call her this morning.  CONSULTS:  none   OUTSIDE RECORDS REVIEWED: Reviewed recent internal medicine notes.       FINAL CLINICAL IMPRESSION(S) / ED DIAGNOSES   Final diagnoses:  Fall, initial encounter  Closed fracture of proximal end of right humerus,  unspecified fracture morphology, initial encounter  Skin tear of right elbow without complication, initial encounter     Rx / DC Orders   ED Discharge Orders          Ordered    oxyCODONE  (ROXICODONE ) 5 MG immediate release tablet  Every 8 hours PRN        02/09/24 0317    ondansetron  (ZOFRAN -ODT) 4 MG disintegrating tablet  Every 6 hours PRN        02/09/24 0317    docusate sodium  (COLACE) 100 MG capsule  2 times daily        02/09/24 9682             Note:  This document was prepared using Dragon voice recognition software and may include unintentional dictation errors.   Robey Massmann, Josette SAILOR, DO 02/09/24 0317    Valda Christenson, Josette SAILOR, DO 02/09/24 [phone]

## 2024-02-09 NOTE — ED Triage Notes (Signed)
 Patient arrives via EMS from home for mechanical fall. Reports right shoulder pain. No head trauma. Denies blood thinners or LOC.

## 2024-02-10 ENCOUNTER — Other Ambulatory Visit: Payer: Self-pay | Admitting: Orthopedic Surgery

## 2024-02-10 DIAGNOSIS — S42201A Unspecified fracture of upper end of right humerus, initial encounter for closed fracture: Secondary | ICD-10-CM

## 2024-02-12 ENCOUNTER — Ambulatory Visit
Admission: RE | Admit: 2024-02-12 | Discharge: 2024-02-12 | Disposition: A | Discharge status: Home / Self Care | Source: Ambulatory Visit | Attending: Orthopedic Surgery | Admitting: Orthopedic Surgery

## 2024-02-12 DIAGNOSIS — S42201A Unspecified fracture of upper end of right humerus, initial encounter for closed fracture: Secondary | ICD-10-CM

## 2024-04-29 NOTE — Progress Notes (Signed)
 Large Joint Injection: R glenohumeral  Date/Time: 04/29/2024 3:30 PM  Performed by: Charlene Debby Bruckner, PA Authorized by: Charlene Debby Bruckner, PA   Procedure discussed: discussed risks, benefits, and alternatives   Consent Given by:  Patient Timeout: timeout called immediately prior to procedure   Prep: patient was prepped and draped in usual sterile fashion   Indications:  Pain Needle Size:  22 G Guidance: ultrasound   Location:  Shoulder Site:  R glenohumeral Topical skin anesthesia: obtained using ethyl chloride spray   Medications:  3 mL BUPivacaine  HCl 0.5 %; 3 mg lidocaine  1 %; 40 mg triamcinolone  acetonide 40 mg/mL Outcome:  Tolerated well, no immediate complications Patient tolerance:  Patient tolerated the procedure well Instructions: patient was counseled as to the expected post injection course, including the possibility of temporary worsening of symptoms   patient was instructed as to concerning symptoms or signs and instructed to contact the office if these should appear
